# Patient Record
Sex: Female | Born: 1960 | Race: White | Hispanic: No | Marital: Married | Smoking: Never smoker
Health system: Southern US, Community
[De-identification: ages and names within clinical notes are randomized; demographics above are authoritative.]

## PROBLEM LIST (undated history)

## (undated) DIAGNOSIS — Z87898 Personal history of other specified conditions: Secondary | ICD-10-CM

## (undated) DIAGNOSIS — C4339 Malignant melanoma of other parts of face: Secondary | ICD-10-CM

## (undated) DIAGNOSIS — R519 Headache, unspecified: Secondary | ICD-10-CM

## (undated) DIAGNOSIS — F329 Major depressive disorder, single episode, unspecified: Secondary | ICD-10-CM

## (undated) DIAGNOSIS — I214 Non-ST elevation (NSTEMI) myocardial infarction: Secondary | ICD-10-CM

## (undated) DIAGNOSIS — G930 Cerebral cysts: Secondary | ICD-10-CM

## (undated) DIAGNOSIS — I10 Essential (primary) hypertension: Secondary | ICD-10-CM

## (undated) DIAGNOSIS — R112 Nausea with vomiting, unspecified: Secondary | ICD-10-CM

## (undated) DIAGNOSIS — T8859XA Other complications of anesthesia, initial encounter: Secondary | ICD-10-CM

## (undated) DIAGNOSIS — E7800 Pure hypercholesterolemia, unspecified: Secondary | ICD-10-CM

## (undated) DIAGNOSIS — J3089 Other allergic rhinitis: Secondary | ICD-10-CM

## (undated) DIAGNOSIS — N200 Calculus of kidney: Secondary | ICD-10-CM

## (undated) DIAGNOSIS — Z9889 Other specified postprocedural states: Secondary | ICD-10-CM

## (undated) DIAGNOSIS — J45909 Unspecified asthma, uncomplicated: Secondary | ICD-10-CM

## (undated) DIAGNOSIS — Z87442 Personal history of urinary calculi: Secondary | ICD-10-CM

## (undated) DIAGNOSIS — R300 Dysuria: Secondary | ICD-10-CM

## (undated) DIAGNOSIS — K219 Gastro-esophageal reflux disease without esophagitis: Secondary | ICD-10-CM

## (undated) DIAGNOSIS — H7490 Unspecified disorder of middle ear and mastoid, unspecified ear: Secondary | ICD-10-CM

## (undated) DIAGNOSIS — C439 Malignant melanoma of skin, unspecified: Secondary | ICD-10-CM

## (undated) DIAGNOSIS — I5022 Chronic systolic (congestive) heart failure: Secondary | ICD-10-CM

## (undated) DIAGNOSIS — N281 Cyst of kidney, acquired: Secondary | ICD-10-CM

## (undated) DIAGNOSIS — Z8719 Personal history of other diseases of the digestive system: Secondary | ICD-10-CM

## (undated) DIAGNOSIS — N201 Calculus of ureter: Secondary | ICD-10-CM

## (undated) DIAGNOSIS — C07 Malignant neoplasm of parotid gland: Secondary | ICD-10-CM

## (undated) DIAGNOSIS — I5181 Takotsubo syndrome: Secondary | ICD-10-CM

## (undated) DIAGNOSIS — E785 Hyperlipidemia, unspecified: Secondary | ICD-10-CM

## (undated) DIAGNOSIS — R350 Frequency of micturition: Secondary | ICD-10-CM

## (undated) DIAGNOSIS — M62830 Muscle spasm of back: Secondary | ICD-10-CM

## (undated) DIAGNOSIS — G8929 Other chronic pain: Secondary | ICD-10-CM

## (undated) DIAGNOSIS — J189 Pneumonia, unspecified organism: Secondary | ICD-10-CM

## (undated) DIAGNOSIS — F32A Depression, unspecified: Secondary | ICD-10-CM

## (undated) DIAGNOSIS — M545 Low back pain, unspecified: Secondary | ICD-10-CM

## (undated) DIAGNOSIS — Z8709 Personal history of other diseases of the respiratory system: Secondary | ICD-10-CM

## (undated) DIAGNOSIS — R3915 Urgency of urination: Secondary | ICD-10-CM

## (undated) DIAGNOSIS — Z973 Presence of spectacles and contact lenses: Secondary | ICD-10-CM

## (undated) DIAGNOSIS — R51 Headache: Secondary | ICD-10-CM

## (undated) DIAGNOSIS — F419 Anxiety disorder, unspecified: Secondary | ICD-10-CM

## (undated) DIAGNOSIS — M1990 Unspecified osteoarthritis, unspecified site: Secondary | ICD-10-CM

## (undated) DIAGNOSIS — I420 Dilated cardiomyopathy: Secondary | ICD-10-CM

## (undated) HISTORY — PX: CARDIOVASCULAR STRESS TEST: SHX262

## (undated) HISTORY — DX: Malignant melanoma of skin, unspecified: C43.9

## (undated) HISTORY — PX: BACK SURGERY: SHX140

## (undated) HISTORY — PX: OTHER SURGICAL HISTORY: SHX169

## (undated) HISTORY — PX: BREAST BIOPSY: SHX20

## (undated) HISTORY — PX: CARDIAC CATHETERIZATION: SHX172

---
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
## 1999-06-20 DIAGNOSIS — C439 Malignant melanoma of skin, unspecified: Secondary | ICD-10-CM

## 1999-06-20 DIAGNOSIS — Z85820 Personal history of malignant melanoma of skin: Secondary | ICD-10-CM

## 1999-06-20 DIAGNOSIS — Z9889 Other specified postprocedural states: Secondary | ICD-10-CM

## 1999-06-20 HISTORY — PX: MELANOMA EXCISION WITH SENTINEL LYMPH NODE BIOPSY: SHX5267

## 1999-06-20 HISTORY — DX: Personal history of malignant melanoma of skin: Z85.820

## 1999-06-20 HISTORY — DX: Other specified postprocedural states: Z98.890

## 1999-06-20 HISTORY — DX: Malignant melanoma of skin, unspecified: C43.9

## 2006-06-19 DIAGNOSIS — Z85818 Personal history of malignant neoplasm of other sites of lip, oral cavity, and pharynx: Secondary | ICD-10-CM

## 2006-06-19 DIAGNOSIS — C089 Malignant neoplasm of major salivary gland, unspecified: Secondary | ICD-10-CM

## 2006-06-19 HISTORY — DX: Malignant neoplasm of major salivary gland, unspecified: C08.9

## 2006-06-19 HISTORY — DX: Personal history of malignant neoplasm of other sites of lip, oral cavity, and pharynx: Z85.818

## 2006-08-09 ENCOUNTER — Ambulatory Visit: Payer: Self-pay | Admitting: Family Medicine

## 2006-08-23 ENCOUNTER — Other Ambulatory Visit: Admission: RE | Admit: 2006-08-23 | Discharge: 2006-08-23 | Payer: Self-pay | Admitting: Otolaryngology

## 2006-08-27 ENCOUNTER — Ambulatory Visit: Payer: Self-pay | Admitting: Oncology

## 2006-09-11 ENCOUNTER — Encounter: Admission: RE | Admit: 2006-09-11 | Discharge: 2006-09-11 | Payer: Self-pay | Admitting: Otolaryngology

## 2006-09-12 ENCOUNTER — Inpatient Hospital Stay (HOSPITAL_COMMUNITY): Admission: RE | Admit: 2006-09-12 | Discharge: 2006-09-15 | Payer: Self-pay | Admitting: Otolaryngology

## 2006-09-12 ENCOUNTER — Encounter (INDEPENDENT_AMBULATORY_CARE_PROVIDER_SITE_OTHER): Payer: Self-pay | Admitting: *Deleted

## 2006-10-12 ENCOUNTER — Ambulatory Visit: Payer: Self-pay | Admitting: Oncology

## 2006-10-30 LAB — CBC WITH DIFFERENTIAL/PLATELET
BASO%: 0.5 % (ref 0.0–2.0)
EOS%: 1.1 % (ref 0.0–7.0)
LYMPH%: 22.9 % (ref 14.0–48.0)
MCHC: 36 g/dL (ref 32.0–36.0)
MCV: 90.1 fL (ref 81.0–101.0)
MONO%: 8.7 % (ref 0.0–13.0)
Platelets: 211 10*3/uL (ref 145–400)
RBC: 4.1 10*6/uL (ref 3.70–5.32)

## 2006-10-30 LAB — COMPREHENSIVE METABOLIC PANEL
ALT: 15 U/L (ref 0–35)
AST: 17 U/L (ref 0–37)
Alkaline Phosphatase: 43 U/L (ref 39–117)
Sodium: 140 mEq/L (ref 135–145)
Total Bilirubin: 0.5 mg/dL (ref 0.3–1.2)
Total Protein: 6.8 g/dL (ref 6.0–8.3)

## 2006-11-05 ENCOUNTER — Ambulatory Visit (HOSPITAL_COMMUNITY): Admission: RE | Admit: 2006-11-05 | Discharge: 2006-11-05 | Payer: Self-pay | Admitting: Oncology

## 2006-11-16 ENCOUNTER — Ambulatory Visit: Admission: RE | Admit: 2006-11-16 | Discharge: 2007-02-27 | Payer: Self-pay | Admitting: Radiation Oncology

## 2006-11-21 ENCOUNTER — Ambulatory Visit: Payer: Self-pay | Admitting: Dentistry

## 2006-11-21 ENCOUNTER — Encounter: Admission: AD | Admit: 2006-11-21 | Discharge: 2006-11-21 | Payer: Self-pay | Admitting: Dentistry

## 2006-11-26 ENCOUNTER — Ambulatory Visit: Payer: Self-pay | Admitting: Dentistry

## 2006-11-27 ENCOUNTER — Ambulatory Visit: Payer: Self-pay | Admitting: Oncology

## 2006-11-29 LAB — CBC WITH DIFFERENTIAL/PLATELET
Basophils Absolute: 0 10*3/uL (ref 0.0–0.1)
EOS%: 1.7 % (ref 0.0–7.0)
Eosinophils Absolute: 0.1 10*3/uL (ref 0.0–0.5)
HCT: 38.2 % (ref 34.8–46.6)
MCH: 32.6 pg (ref 26.0–34.0)
MCHC: 36.1 g/dL — ABNORMAL HIGH (ref 32.0–36.0)
MONO#: 0.4 10*3/uL (ref 0.1–0.9)
MONO%: 10.8 % (ref 0.0–13.0)
Platelets: 201 10*3/uL (ref 145–400)
WBC: 3.9 10*3/uL (ref 3.9–10.0)

## 2006-11-29 LAB — COMPREHENSIVE METABOLIC PANEL
AST: 16 U/L (ref 0–37)
Albumin: 4.3 g/dL (ref 3.5–5.2)
Alkaline Phosphatase: 41 U/L (ref 39–117)
BUN: 16 mg/dL (ref 6–23)
Calcium: 8.5 mg/dL (ref 8.4–10.5)
Chloride: 106 mEq/L (ref 96–112)
Glucose, Bld: 97 mg/dL (ref 70–99)
Potassium: 4.2 mEq/L (ref 3.5–5.3)
Sodium: 140 mEq/L (ref 135–145)
Total Protein: 6.7 g/dL (ref 6.0–8.3)

## 2006-12-25 LAB — CBC WITH DIFFERENTIAL/PLATELET
BASO%: 0.3 % (ref 0.0–2.0)
EOS%: 1.3 % (ref 0.0–7.0)
LYMPH%: 23.5 % (ref 14.0–48.0)
MCHC: 35.7 g/dL (ref 32.0–36.0)
MCV: 91 fL (ref 81.0–101.0)
MONO%: 9.3 % (ref 0.0–13.0)
Platelets: 191 10*3/uL (ref 145–400)
RBC: 4.3 10*6/uL (ref 3.70–5.32)
RDW: 12.2 % (ref 11.3–14.5)

## 2007-01-02 ENCOUNTER — Ambulatory Visit: Payer: Self-pay | Admitting: Dentistry

## 2007-01-31 ENCOUNTER — Emergency Department (HOSPITAL_COMMUNITY): Admission: EM | Admit: 2007-01-31 | Discharge: 2007-01-31 | Payer: Self-pay | Admitting: Emergency Medicine

## 2007-02-15 ENCOUNTER — Ambulatory Visit: Payer: Self-pay | Admitting: Oncology

## 2007-03-05 ENCOUNTER — Ambulatory Visit: Payer: Self-pay | Admitting: Dentistry

## 2007-03-05 LAB — COMPREHENSIVE METABOLIC PANEL
ALT: 9 U/L (ref 0–35)
Albumin: 3.8 g/dL (ref 3.5–5.2)
CO2: 23 mEq/L (ref 19–32)
Potassium: 3.7 mEq/L (ref 3.5–5.3)
Sodium: 137 mEq/L (ref 135–145)
Total Bilirubin: 0.7 mg/dL (ref 0.3–1.2)
Total Protein: 5.9 g/dL — ABNORMAL LOW (ref 6.0–8.3)

## 2007-03-05 LAB — CBC WITH DIFFERENTIAL/PLATELET
BASO%: 0.1 % (ref 0.0–2.0)
LYMPH%: 15.5 % (ref 14.0–48.0)
MCHC: 35.7 g/dL (ref 32.0–36.0)
MONO#: 0.2 10*3/uL (ref 0.1–0.9)
NEUT#: 1.3 10*3/uL — ABNORMAL LOW (ref 1.5–6.5)
Platelets: 145 10*3/uL (ref 145–400)
RBC: 3.74 10*6/uL (ref 3.70–5.32)
RDW: 13.7 % (ref 11.3–14.5)
WBC: 1.9 10*3/uL — ABNORMAL LOW (ref 3.9–10.0)
lymph#: 0.3 10*3/uL — ABNORMAL LOW (ref 0.9–3.3)

## 2007-03-07 ENCOUNTER — Ambulatory Visit: Admission: RE | Admit: 2007-03-07 | Discharge: 2007-03-07 | Payer: Self-pay | Admitting: Radiation Oncology

## 2007-04-02 ENCOUNTER — Ambulatory Visit (HOSPITAL_COMMUNITY): Admission: RE | Admit: 2007-04-02 | Discharge: 2007-04-02 | Payer: Self-pay | Admitting: Oncology

## 2007-04-03 ENCOUNTER — Ambulatory Visit: Payer: Self-pay | Admitting: Oncology

## 2007-04-05 LAB — CBC WITH DIFFERENTIAL/PLATELET
BASO%: 0.2 % (ref 0.0–2.0)
LYMPH%: 15.1 % (ref 14.0–48.0)
MCHC: 35.6 g/dL (ref 32.0–36.0)
MCV: 92.2 fL (ref 81.0–101.0)
MONO#: 0.3 10*3/uL (ref 0.1–0.9)
MONO%: 12.1 % (ref 0.0–13.0)
Platelets: 179 10*3/uL (ref 145–400)
RBC: 3.45 10*6/uL — ABNORMAL LOW (ref 3.70–5.32)
WBC: 2.8 10*3/uL — ABNORMAL LOW (ref 3.9–10.0)

## 2007-04-05 LAB — COMPREHENSIVE METABOLIC PANEL
ALT: <8 U/L (ref 0–35)
Alkaline Phosphatase: 33 U/L — ABNORMAL LOW (ref 39–117)
Sodium: 139 mEq/L (ref 135–145)
Total Bilirubin: 0.4 mg/dL (ref 0.3–1.2)
Total Protein: 6.5 g/dL (ref 6.0–8.3)

## 2007-04-24 ENCOUNTER — Encounter: Admission: RE | Admit: 2007-04-24 | Discharge: 2007-04-24 | Payer: Self-pay | Admitting: Oncology

## 2007-06-03 ENCOUNTER — Ambulatory Visit: Payer: Self-pay | Admitting: Oncology

## 2007-06-05 LAB — CBC WITH DIFFERENTIAL/PLATELET
BASO%: 0.4 % (ref 0.0–2.0)
LYMPH%: 16.1 % (ref 14.0–48.0)
MCHC: 35.4 g/dL (ref 32.0–36.0)
MCV: 92.9 fL (ref 81.0–101.0)
MONO%: 9.9 % (ref 0.0–13.0)
Platelets: 153 10*3/uL (ref 145–400)
RBC: 4.03 10*6/uL (ref 3.70–5.32)

## 2007-06-05 LAB — COMPREHENSIVE METABOLIC PANEL
Alkaline Phosphatase: 41 U/L (ref 39–117)
Glucose, Bld: 93 mg/dL (ref 70–99)
Sodium: 143 mEq/L (ref 135–145)
Total Bilirubin: 0.5 mg/dL (ref 0.3–1.2)
Total Protein: 6.9 g/dL (ref 6.0–8.3)

## 2007-10-01 ENCOUNTER — Ambulatory Visit (HOSPITAL_COMMUNITY): Admission: RE | Admit: 2007-10-01 | Discharge: 2007-10-01 | Payer: Self-pay | Admitting: Oncology

## 2007-10-08 ENCOUNTER — Ambulatory Visit: Payer: Self-pay | Admitting: Oncology

## 2007-10-10 LAB — CBC WITH DIFFERENTIAL/PLATELET
BASO%: 0.3 % (ref 0.0–2.0)
LYMPH%: 13.6 % — ABNORMAL LOW (ref 14.0–48.0)
MCHC: 36.3 g/dL — ABNORMAL HIGH (ref 32.0–36.0)
MONO#: 0.3 10*3/uL (ref 0.1–0.9)
Platelets: 168 10*3/uL (ref 145–400)
RBC: 3.97 10*6/uL (ref 3.70–5.32)
RDW: 12.8 % (ref 11.3–14.5)
WBC: 2.8 10*3/uL — ABNORMAL LOW (ref 3.9–10.0)

## 2007-10-10 LAB — COMPREHENSIVE METABOLIC PANEL
ALT: 9 U/L (ref 0–35)
Albumin: 4.3 g/dL (ref 3.5–5.2)
Alkaline Phosphatase: 42 U/L (ref 39–117)
CO2: 22 mEq/L (ref 19–32)
Potassium: 4.3 mEq/L (ref 3.5–5.3)
Sodium: 140 mEq/L (ref 135–145)
Total Bilirubin: 0.5 mg/dL (ref 0.3–1.2)
Total Protein: 6.7 g/dL (ref 6.0–8.3)

## 2007-12-05 ENCOUNTER — Ambulatory Visit: Admission: RE | Admit: 2007-12-05 | Discharge: 2007-12-05 | Payer: Self-pay | Admitting: Radiation Oncology

## 2007-12-05 LAB — TSH: TSH: 1.021 u[IU]/mL (ref 0.350–5.500)

## 2008-01-07 ENCOUNTER — Ambulatory Visit: Payer: Self-pay | Admitting: Oncology

## 2008-01-09 LAB — COMPREHENSIVE METABOLIC PANEL
AST: 13 U/L (ref 0–37)
Albumin: 4.4 g/dL (ref 3.5–5.2)
BUN: 16 mg/dL (ref 6–23)
CO2: 26 mEq/L (ref 19–32)
Calcium: 8.7 mg/dL (ref 8.4–10.5)
Chloride: 105 mEq/L (ref 96–112)
Creatinine, Ser: 0.64 mg/dL (ref 0.40–1.20)
Glucose, Bld: 82 mg/dL (ref 70–99)
Potassium: 3.9 mEq/L (ref 3.5–5.3)

## 2008-01-09 LAB — CBC WITH DIFFERENTIAL/PLATELET
Basophils Absolute: 0 10*3/uL (ref 0.0–0.1)
Eosinophils Absolute: 0 10*3/uL (ref 0.0–0.5)
HCT: 35.1 % (ref 34.8–46.6)
HGB: 12.4 g/dL (ref 11.6–15.9)
MONO#: 0.3 10*3/uL (ref 0.1–0.9)
NEUT#: 1.8 10*3/uL (ref 1.5–6.5)
NEUT%: 68.9 % (ref 39.6–76.8)
RDW: 12.8 % (ref 11.3–14.5)
lymph#: 0.5 10*3/uL — ABNORMAL LOW (ref 0.9–3.3)

## 2008-01-09 LAB — LACTATE DEHYDROGENASE: LDH: 103 U/L (ref 94–250)

## 2008-01-19 ENCOUNTER — Emergency Department (HOSPITAL_COMMUNITY): Admission: EM | Admit: 2008-01-19 | Discharge: 2008-01-19 | Payer: Self-pay | Admitting: Emergency Medicine

## 2008-04-02 ENCOUNTER — Ambulatory Visit: Payer: Self-pay | Admitting: Oncology

## 2008-04-06 ENCOUNTER — Ambulatory Visit (HOSPITAL_COMMUNITY): Admission: RE | Admit: 2008-04-06 | Discharge: 2008-04-06 | Payer: Self-pay | Admitting: Oncology

## 2008-04-06 LAB — CBC WITH DIFFERENTIAL/PLATELET
BASO%: 0.4 % (ref 0.0–2.0)
Basophils Absolute: 0 10*3/uL (ref 0.0–0.1)
EOS%: 1.5 % (ref 0.0–7.0)
HCT: 36 % (ref 34.8–46.6)
HGB: 12.9 g/dL (ref 11.6–15.9)
LYMPH%: 27.6 % (ref 14.0–48.0)
MCH: 33.4 pg (ref 26.0–34.0)
MCHC: 35.8 g/dL (ref 32.0–36.0)
NEUT%: 61 % (ref 39.6–76.8)
Platelets: 139 10*3/uL — ABNORMAL LOW (ref 145–400)
lymph#: 0.7 10*3/uL — ABNORMAL LOW (ref 0.9–3.3)

## 2008-04-06 LAB — COMPREHENSIVE METABOLIC PANEL
ALT: 10 U/L (ref 0–35)
BUN: 8 mg/dL (ref 6–23)
CO2: 30 mEq/L (ref 19–32)
Calcium: 9.3 mg/dL (ref 8.4–10.5)
Chloride: 102 mEq/L (ref 96–112)
Creatinine, Ser: 0.66 mg/dL (ref 0.40–1.20)
Total Bilirubin: 0.8 mg/dL (ref 0.3–1.2)

## 2008-04-06 LAB — LACTATE DEHYDROGENASE: LDH: 88 U/L — ABNORMAL LOW (ref 94–250)

## 2008-05-05 ENCOUNTER — Encounter: Admission: RE | Admit: 2008-05-05 | Discharge: 2008-05-05 | Payer: Self-pay | Admitting: Obstetrics and Gynecology

## 2008-07-06 ENCOUNTER — Ambulatory Visit: Payer: Self-pay | Admitting: Oncology

## 2009-04-12 ENCOUNTER — Ambulatory Visit (HOSPITAL_COMMUNITY): Admission: RE | Admit: 2009-04-12 | Discharge: 2009-04-12 | Payer: Self-pay | Admitting: Obstetrics and Gynecology

## 2009-04-12 HISTORY — PX: DIAGNOSTIC LAPAROSCOPY: SUR761

## 2009-04-18 ENCOUNTER — Inpatient Hospital Stay (HOSPITAL_COMMUNITY): Admission: AD | Admit: 2009-04-18 | Discharge: 2009-04-18 | Payer: Self-pay | Admitting: Obstetrics and Gynecology

## 2009-05-25 ENCOUNTER — Encounter: Admission: RE | Admit: 2009-05-25 | Discharge: 2009-05-25 | Payer: Self-pay | Admitting: Obstetrics and Gynecology

## 2010-05-17 ENCOUNTER — Ambulatory Visit (HOSPITAL_COMMUNITY)
Admission: RE | Admit: 2010-05-17 | Discharge: 2010-05-17 | Payer: Self-pay | Source: Home / Self Care | Admitting: Urology

## 2010-05-31 ENCOUNTER — Encounter
Admission: RE | Admit: 2010-05-31 | Discharge: 2010-05-31 | Payer: Self-pay | Source: Home / Self Care | Attending: Obstetrics and Gynecology | Admitting: Obstetrics and Gynecology

## 2010-07-10 ENCOUNTER — Encounter: Payer: Self-pay | Admitting: Oncology

## 2010-07-10 ENCOUNTER — Encounter: Payer: Self-pay | Admitting: Otolaryngology

## 2010-09-08 ENCOUNTER — Ambulatory Visit: Payer: BC Managed Care – PPO | Attending: Specialist | Admitting: Physical Therapy

## 2010-09-08 DIAGNOSIS — M545 Low back pain, unspecified: Secondary | ICD-10-CM | POA: Insufficient documentation

## 2010-09-08 DIAGNOSIS — R5381 Other malaise: Secondary | ICD-10-CM | POA: Insufficient documentation

## 2010-09-08 DIAGNOSIS — IMO0001 Reserved for inherently not codable concepts without codable children: Secondary | ICD-10-CM | POA: Insufficient documentation

## 2010-09-15 ENCOUNTER — Ambulatory Visit: Payer: BC Managed Care – PPO | Admitting: Physical Therapy

## 2010-09-16 ENCOUNTER — Ambulatory Visit: Payer: BC Managed Care – PPO | Admitting: Physical Therapy

## 2010-09-22 ENCOUNTER — Ambulatory Visit: Payer: BC Managed Care – PPO | Attending: Specialist | Admitting: Physical Therapy

## 2010-09-22 DIAGNOSIS — M545 Low back pain, unspecified: Secondary | ICD-10-CM | POA: Insufficient documentation

## 2010-09-22 DIAGNOSIS — R5381 Other malaise: Secondary | ICD-10-CM | POA: Insufficient documentation

## 2010-09-22 DIAGNOSIS — IMO0001 Reserved for inherently not codable concepts without codable children: Secondary | ICD-10-CM | POA: Insufficient documentation

## 2010-09-22 LAB — DIFFERENTIAL
Eosinophils Relative: 1 % (ref 0–5)
Lymphocytes Relative: 19 % (ref 12–46)
Lymphs Abs: 0.9 10*3/uL (ref 0.7–4.0)
Neutro Abs: 3.5 10*3/uL (ref 1.7–7.7)

## 2010-09-22 LAB — COMPREHENSIVE METABOLIC PANEL
AST: 22 U/L (ref 0–37)
BUN: 8 mg/dL (ref 6–23)
CO2: 28 mEq/L (ref 19–32)
Calcium: 9.3 mg/dL (ref 8.4–10.5)
Creatinine, Ser: 0.53 mg/dL (ref 0.4–1.2)
GFR calc Af Amer: >60 mL/min (ref >60)
GFR calc non Af Amer: >60 mL/min (ref >60)

## 2010-09-22 LAB — CBC
MCHC: 34.2 g/dL (ref 30.0–36.0)
MCV: 97.3 fL (ref 78.0–100.0)
Platelets: 181 10*3/uL (ref 150–400)

## 2010-09-22 LAB — WOUND CULTURE: Gram Stain: NONE SEEN

## 2010-09-29 ENCOUNTER — Ambulatory Visit: Payer: BC Managed Care – PPO | Admitting: Physical Therapy

## 2010-11-01 NOTE — Discharge Summary (Signed)
NAMEMARCIANNA, [name]   MEDICAL RECORD NO.:  [id number]          PATIENT TYPE:  INP   LOCATION:  5736                         FACILITY:  [facility]   PHYSICIAN:  Antony Contras, MD     DATE OF BIRTH:  Aug 25, 1960   DATE OF ADMISSION:  09/12/2006  DATE OF DISCHARGE:  09/15/2006                               DISCHARGE SUMMARY   ADMITTING DIAGNOSIS:  Right malignant parotid mass.   DISCHARGE DIAGNOSIS:  Right malignant parotid mass.   PROCEDURES:  1. Right lateral parotidectomy with facial nerve dissection.  2. Right modified radical neck dissection.   HISTORY OF PRESENT ILLNESS:  The patient is a 50 year old white female  with a remote history of right upper eyelid melanoma, who now has a  several-week history of a tender mass in the right parotid gland.  An  FNA is suggestive of metastatic melanoma.  She presents to the hospital  to go to the operating room for surgical management.   HOSPITAL COURSE:  The patient was taken to the operating room on September 12, 2006 for the above procedures.  For details of the procedures,  please see the dictated operative note.  A drain was placed during  surgery.  Afterwards, she was transferred to a regular hospital room for  drain management.  She complained of nausea and chest discomfort after  surgery.  An EKG was unrevealing.  On the following day, she continued  to have some nausea, but her chest pain improved.  She did complain of  some right upper quadrant pain during that day and was found to have  some tenderness in the right upper quadrant.  A right upper quadrant  ultrasound was performed which demonstrated no pathology.  The abdominal  pain improved with ambulation and resumption of bowel function.  On  postoperative day #3, the drain output had decreased and was removed.  She was able to be discharged from the hospital that day in stable  condition.  She had not yet had a bowel movement, but was  instructed to  call if this continued to be a problem.   DISCHARGE INSTRUCTIONS:  The patient was instructed to clean her  incision twice Tammy with half-strength peroxide and apply bacitracin  ointment.   DIET:  Not restricted.   ACTIVITY:  The patient was asked to increase activity slowly.   DISCHARGE MEDICATIONS:  Prescriptions were given including Percocet  5/325 one to two tablets every 4-6 hours as needed.   FOLLOWUP:  The patient was asked to follow up in 1 week with Dr. Jenne Pane.      Antony Contras, MD  Electronically Signed     DDB/MEDQ  D:  11/09/2006  T:  11/10/2006  Job:  [id number]

## 2010-11-04 NOTE — Op Note (Signed)
[name], [name] NO.:  [id number]   MEDICAL RECORD NO.:  [id number]          PATIENT TYPE:  INP   LOCATION:  5736                         FACILITY:  [facility]   PHYSICIAN:  Antony Contras, MD     DATE OF BIRTH:  Dec 03, 1960   DATE OF PROCEDURE:  09/12/2006  DATE OF DISCHARGE:                               OPERATIVE REPORT   PREOPERATIVE DIAGNOSIS:  Right parotid malignant mass.   POSTOP DIAGNOSIS:  Right parotid malignant mass.   PROCEDURE:  1. Right lateral parotidectomy with nerve dissection.  2. Right modified radical neck dissection sparing the SCM, eleventh      nerve, and internal jugular vein.   SURGEON:  Antony Contras, MD   ASSISTANT:  Gloris Manchester. Lazarus Salines, M.D.   ANESTHESIA:  General endotracheal anesthesia.   COMPLICATIONS:  None.   INDICATIONS:  The patient is a 50 year old white female with a history  of melanoma in 2001 of the right upper eyelid.  She underwent Mohs  resection and sentinel node biopsy, at the time, which was negative.  About a month ago, she developed a mass in front of the right ear that  has been comfortable.  Needle biopsy is suggestive of a malignancy.  She  presents the operating room for surgical management.   FINDINGS:  There was a round mass within the lateral parotid gland as  well as a few other smaller lymph nodes.  The primary mass was sent for  frozen section, and showed a high-grade malignancy.  Zones 1-5 were  removed from the right side of the neck with a few small lymph nodes  within the specimen, but no obvious both metastatic disease.   DESCRIPTION OF PROCEDURE:  The patient was identified in the holding  room, and with informed consent having been obtained, the patient was  brought to the operating suited and placed on the operating table in the  supine position.  Anesthesia was induced; and the patient was intubated  by the anesthesia team without difficulty.  The patient was given  intravenous  antibiotics during the case.  The eyes were taped closed;  and the nerve integrity monitor was attached in the standard fashion and  turned on.  The right face was prepped and draped in sterile fashion;  and the patient was placed on a shoulder roll.  The drapes were placed  in such a way that the face could be watched during the case.  The  incision was marked with a marking pen, starting with a preauricular  incision and then carried down through a neck crease onto the neck.  It  was injected with 1% lidocaine with 1:100,000 of epinephrine.  The  incision was made with a 15-blade scalpel through the skin; and with a  Bovie, electrocautery through the subcutaneous tissues.  The platysma  muscle was divided inferiorly; and the subplatysmal flap was elevated.  A preparotid fascia flap was elevated over the parotid gland using sharp  dissection.   Flaps were then sewed back with silk suture.  Dissection was then  performed in front of  the tragus as well as down to the digastric  muscle.  Careful dissection was performed in this area until the main  trunk of the facial nerve was identified.  The nerve integrity monitor  was used to help with this process.  The nerve was then dissected in a  distal direction, first involving the inferior division, and elevating  the gland off of the division.  This was extended then to the middle  divisions; and then following the superior division.  The mass that was  within the superior gland was able to be elevated off of the nerves  without any difficulty.  The gland was then further dissected in an  anterior direction, keeping all of the nerve branches in view, until the  lateral gland was removed.  This was passed to nursing for pathology and  a frozen section was requested on the dominant mass.   While we were waiting for this, subplatysmal flaps were elevated in the  neck superiorly and inferiorly.  The marginal mandibular nerve was then  traced in  the distal direction elevating soft tissues in an inferior  direction; and exposing the mandibular border.  This was extended into  the submental region until the digastric muscle could be traced in a  retrograde fashion toward the hyoid bone.  It was thought that some of  the fat from the submental triangle was also removed in this fashion.  The strap muscles were then dissected in an inferior direction providing  a medial border for the dissection.  The digastric muscle was then  further dissected as well as mylohyoid muscle.  The muscle was pulled  anteriorly exposing the zone 1 region.  The submandibular gland was then  dissected free from the underlying tissues until it was tethered on the  duct, and the lingual nerve.  Both of these were divided and ligated.  The gland was then retracted inferiorly with the neck contents over the  digastric muscle.   At this point, the sternocleidomastoid muscle was skeletonized removing  the soft tissues around muscle keeping it attached the specimen.  Further dissection was performed in this regard until the carotid artery  was dissected inferiorly underneath the mylohyoid muscle.  The carotid  and jugular veins were then dissected in an inferior-to-superior  direction.  The fascia was carefully dissected off the internal jugular  vein using a 15-blade scalpel; and branches of the vessels were divided  and ligated in this process.  The twelfth nerve was identified as was at  the eleventh nerve during the dissection.   The zone 2B region was then dissected off of the digastric muscle; and  sternocleidomastoid muscle down to the mastoid tip.  This was then  passed under the eleventh nerve and then the dissection continued in an  inferior direction in this region.  This was performed back underneath  the sternocleidomastoid muscle that includes on C5.  The cervical roots  were cut in the process to allow removal of the tissue of these tissues. The  phrenic nerve was identified and traced keeping it intact as well.  The neck contents were then removed, and marked with a suture.  The neck  was then copiously irrigated with saline; and any bleeding controlled  with bipolar electrocautery.  A Valsalva was performed; and no  additional bleeding was seen.  A large drain was placed in the  dissection region, then coming out a separate stab incision inferiorly.  It was secured to the skin  using a 2-0 silk suture and a standard drain  stitch.   The incision was then closed with 3-0 Vicryl suture in an interrupted  and simple running fashion in the platysma layer.  Skin was then closed  with a running 5-0 nylon suture.  The drain was hooked to suction during  closure.  At this point, the patient was returned to Anesthesia for wake-  up; and was extubated curved and moved to the recovery room in stable  condition.      Antony Contras, MD  Electronically Signed     DDB/MEDQ  D:  09/12/2006  T:  09/12/2006  Job:  [id number]

## 2011-03-17 LAB — DIFFERENTIAL
Basophils Absolute: 0
Basophils Relative: 0
Eosinophils Absolute: 0
Eosinophils Relative: 1
Lymphocytes Relative: 9 — ABNORMAL LOW
Lymphs Abs: 0.4 — ABNORMAL LOW
Monocytes Absolute: 0.4
Monocytes Relative: 9
Neutro Abs: 4.1
Neutrophils Relative %: 82 — ABNORMAL HIGH

## 2011-03-17 LAB — URINALYSIS, ROUTINE W REFLEX MICROSCOPIC
Ketones, ur: 15 — AB
Nitrite: NEGATIVE
pH: 6.5

## 2011-03-17 LAB — CBC
HCT: 40.8
Hemoglobin: 14.3
MCHC: 35
MCV: 96.2
Platelets: 139 — ABNORMAL LOW
RBC: 4.24
RDW: 12.7
WBC: 5.1

## 2011-03-17 LAB — BASIC METABOLIC PANEL
BUN: 9
CO2: 27
Calcium: 9.1
Chloride: 102
Creatinine, Ser: 0.66
GFR calc Af Amer: >60
GFR calc non Af Amer: >60
Glucose, Bld: 85
Potassium: 4.3
Sodium: 137

## 2011-03-17 LAB — CK TOTAL AND CKMB (NOT AT ARMC): Relative Index: INVALID

## 2011-03-17 LAB — TROPONIN I: Troponin I: 0.01

## 2011-03-23 ENCOUNTER — Other Ambulatory Visit: Payer: Self-pay | Admitting: Obstetrics and Gynecology

## 2011-03-23 DIAGNOSIS — Z1231 Encounter for screening mammogram for malignant neoplasm of breast: Secondary | ICD-10-CM

## 2011-04-03 LAB — URINALYSIS, ROUTINE W REFLEX MICROSCOPIC
Ketones, ur: 15 — AB
Leukocytes, UA: NEGATIVE
Nitrite: NEGATIVE
Specific Gravity, Urine: 1.019
Urobilinogen, UA: 0.2

## 2011-04-03 LAB — URINE MICROSCOPIC-ADD ON

## 2011-04-26 ENCOUNTER — Other Ambulatory Visit: Payer: Self-pay | Admitting: Specialist

## 2011-04-26 DIAGNOSIS — M5126 Other intervertebral disc displacement, lumbar region: Secondary | ICD-10-CM

## 2011-05-05 ENCOUNTER — Ambulatory Visit
Admission: RE | Admit: 2011-05-05 | Discharge: 2011-05-05 | Disposition: A | Discharge status: Home / Self Care | Payer: BC Managed Care – PPO | Source: Ambulatory Visit | Attending: Specialist | Admitting: Specialist

## 2011-05-05 DIAGNOSIS — M5126 Other intervertebral disc displacement, lumbar region: Secondary | ICD-10-CM

## 2011-05-05 MED ORDER — IOHEXOL 180 MG/ML  SOLN
13.0000 mL | Freq: Once | INTRAMUSCULAR | Status: AC | PRN
  Administered 2011-05-05: 13 mL via INTRATHECAL

## 2011-05-05 MED ORDER — DIAZEPAM 5 MG PO TABS
10.0000 mg | ORAL_TABLET | Freq: Once | ORAL | Status: AC
  Administered 2011-05-05: 10 mg via ORAL

## 2011-05-05 NOTE — Patient Instructions (Signed)

## 2011-05-10 ENCOUNTER — Other Ambulatory Visit: Payer: Self-pay | Admitting: Otolaryngology

## 2011-05-10 ENCOUNTER — Encounter (HOSPITAL_COMMUNITY): Payer: Self-pay | Admitting: Pharmacy Technician

## 2011-05-15 ENCOUNTER — Encounter (HOSPITAL_COMMUNITY): Payer: Self-pay

## 2011-05-15 ENCOUNTER — Encounter (HOSPITAL_COMMUNITY)
Admission: RE | Admit: 2011-05-15 | Discharge: 2011-05-15 | Disposition: A | Discharge status: Home / Self Care | Payer: BC Managed Care – PPO | Source: Ambulatory Visit | Attending: Specialist | Admitting: Specialist

## 2011-05-15 HISTORY — DX: Calculus of kidney: N20.0

## 2011-05-15 HISTORY — DX: Other specified postprocedural states: R11.2

## 2011-05-15 HISTORY — DX: Other specified postprocedural states: Z98.890

## 2011-05-15 LAB — URINALYSIS, ROUTINE W REFLEX MICROSCOPIC
Glucose, UA: NEGATIVE mg/dL
Protein, ur: NEGATIVE mg/dL
Specific Gravity, Urine: 1.015 (ref 1.005–1.030)
Urobilinogen, UA: 0.2 mg/dL (ref 0.0–1.0)

## 2011-05-15 LAB — COMPREHENSIVE METABOLIC PANEL
AST: 19 U/L (ref 0–37)
Albumin: 4.1 g/dL (ref 3.5–5.2)
CO2: 29 mEq/L (ref 19–32)
Calcium: 9.9 mg/dL (ref 8.4–10.5)
Creatinine, Ser: 0.63 mg/dL (ref 0.50–1.10)
GFR calc non Af Amer: >90 mL/min (ref >90)
Total Protein: 7.1 g/dL (ref 6.0–8.3)

## 2011-05-15 LAB — DIFFERENTIAL
Basophils Absolute: 0 10*3/uL (ref 0.0–0.1)
Lymphocytes Relative: 28 % (ref 12–46)
Lymphs Abs: 0.9 10*3/uL (ref 0.7–4.0)
Monocytes Absolute: 0.3 10*3/uL (ref 0.1–1.0)
Neutro Abs: 1.9 10*3/uL (ref 1.7–7.7)

## 2011-05-15 LAB — CBC
MCH: 31.4 pg (ref 26.0–34.0)
MCHC: 35.3 g/dL (ref 30.0–36.0)
MCV: 89 fL (ref 78.0–100.0)
Platelets: 155 10*3/uL (ref 150–400)
RDW: 12.4 % (ref 11.5–15.5)

## 2011-05-15 LAB — URINE MICROSCOPIC-ADD ON

## 2011-05-15 LAB — SURGICAL PCR SCREEN: MRSA, PCR: NEGATIVE

## 2011-05-15 NOTE — Patient Instructions (Signed)
20 [name]  05/15/2011   Your procedure is scheduled on:  Thurs. 05/18/2011  Report to [facility] at 1035 AM.  Call this number if you have problems the morning of surgery: [phone]   Remember: Pre-admission #- [phone] [facility]   Do not eat food:After Midnight.  May have clear liquids:until Midnight .  Clear liquids include soda, tea, black coffee, apple or grape juice, broth.  Take these medicines the morning of surgery with A SIP OF WATER: Hydrocodone   Do not wear jewelry, make-up or nail polish.  Do not wear lotions, powders, or perfumes.   Do not shave 48 hours prior to surgery.( women only )  Do not bring valuables to the hospital.  Contacts, dentures or bridgework may not be worn into surgery.  Leave suitcase in the car. After surgery it may be brought to your room.  For patients admitted to the hospital, checkout time is 11:00 AM the day of discharge.   Patients discharged the day of surgery will not be allowed to drive home.  Name and phone number of your driver:   Special Instructions: CHG Shower Use Special Wash: 1/2 bottle night before surgery and 1/2 bottle morning of surgery.   Please read over the following fact sheets that you were given: MRSA Information

## 2011-05-16 ENCOUNTER — Other Ambulatory Visit: Payer: Self-pay | Admitting: Otolaryngology

## 2011-05-18 ENCOUNTER — Observation Stay (HOSPITAL_COMMUNITY)
Admission: RE | Admit: 2011-05-18 | Discharge: 2011-05-19 | Disposition: A | Discharge status: Home / Self Care | Payer: BC Managed Care – PPO | Source: Ambulatory Visit | Attending: Specialist | Admitting: Specialist

## 2011-05-18 ENCOUNTER — Encounter (HOSPITAL_COMMUNITY): Payer: Self-pay | Admitting: *Deleted

## 2011-05-18 ENCOUNTER — Ambulatory Visit (HOSPITAL_COMMUNITY): Payer: BC Managed Care – PPO | Admitting: Anesthesiology

## 2011-05-18 ENCOUNTER — Encounter (HOSPITAL_COMMUNITY): Payer: Self-pay | Admitting: Anesthesiology

## 2011-05-18 ENCOUNTER — Ambulatory Visit (HOSPITAL_COMMUNITY): Payer: BC Managed Care – PPO

## 2011-05-18 ENCOUNTER — Encounter (HOSPITAL_COMMUNITY)
Admission: RE | Disposition: A | Discharge status: Home / Self Care | Payer: Self-pay | Source: Ambulatory Visit | Attending: Specialist

## 2011-05-18 DIAGNOSIS — M48061 Spinal stenosis, lumbar region without neurogenic claudication: Principal | ICD-10-CM | POA: Insufficient documentation

## 2011-05-18 DIAGNOSIS — IMO0002 Reserved for concepts with insufficient information to code with codable children: Secondary | ICD-10-CM | POA: Insufficient documentation

## 2011-05-18 DIAGNOSIS — Z85820 Personal history of malignant melanoma of skin: Secondary | ICD-10-CM | POA: Insufficient documentation

## 2011-05-18 HISTORY — PX: LUMBAR LAMINECTOMY/DECOMPRESSION MICRODISCECTOMY: SHX5026

## 2011-05-18 SURGERY — LUMBAR LAMINECTOMY/DECOMPRESSION MICRODISCECTOMY
Anesthesia: General | Site: Back | Laterality: Right | Wound class: Clean

## 2011-05-18 MED ORDER — PROMETHAZINE HCL 25 MG/ML IJ SOLN: 6.2500 mg | INTRAMUSCULAR | Status: DC | PRN

## 2011-05-18 MED ORDER — MIDAZOLAM HCL 5 MG/5ML IJ SOLN
INTRAMUSCULAR | Status: DC | PRN
  Administered 2011-05-18: 2 mg via INTRAVENOUS

## 2011-05-18 MED ORDER — SCOPOLAMINE 1 MG/3DAYS TD PT72
MEDICATED_PATCH | TRANSDERMAL | Status: DC | PRN
  Administered 2011-05-18: 1.5 mg via TRANSDERMAL

## 2011-05-18 MED ORDER — FENTANYL CITRATE 0.05 MG/ML IJ SOLN
INTRAMUSCULAR | Status: DC | PRN
  Administered 2011-05-18 (×2): 50 ug via INTRAVENOUS

## 2011-05-18 MED ORDER — LACTATED RINGERS IV SOLN
INTRAVENOUS | Status: DC
  Administered 2011-05-18: 1000 mL via INTRAVENOUS
  Administered 2011-05-18: 13:00:00 via INTRAVENOUS

## 2011-05-18 MED ORDER — SODIUM CHLORIDE 0.9 % IJ SOLN: 3.0000 mL | INTRAMUSCULAR | Status: DC | PRN

## 2011-05-18 MED ORDER — MORPHINE SULFATE 4 MG/ML IJ SOLN
1.0000 mg | INTRAMUSCULAR | Status: DC | PRN
  Administered 2011-05-19: 2 mg via INTRAVENOUS
  Filled 2011-05-18: qty 1

## 2011-05-18 MED ORDER — HYDROCODONE-ACETAMINOPHEN 5-325 MG PO TABS
1.0000 | ORAL_TABLET | ORAL | Status: DC | PRN
  Administered 2011-05-18 (×2): 1 via ORAL
  Filled 2011-05-18: qty 2

## 2011-05-18 MED ORDER — PHENYLEPHRINE HCL 10 MG/ML IJ SOLN
10.0000 mg | INTRAVENOUS | Status: DC | PRN
  Administered 2011-05-18: 20 ug/min via INTRAVENOUS

## 2011-05-18 MED ORDER — EPHEDRINE SULFATE 50 MG/ML IJ SOLN
INTRAMUSCULAR | Status: DC | PRN
  Administered 2011-05-18: 10 mg via INTRAVENOUS
  Administered 2011-05-18: 5 mg via INTRAVENOUS

## 2011-05-18 MED ORDER — SODIUM CHLORIDE 0.9 % IR SOLN
Status: DC | PRN
  Administered 2011-05-18: 13:00:00

## 2011-05-18 MED ORDER — SODIUM CHLORIDE 0.9 % IJ SOLN
3.0000 mL | Freq: Two times a day (BID) | INTRAMUSCULAR | Status: DC
  Administered 2011-05-19: 3 mL via INTRAVENOUS

## 2011-05-18 MED ORDER — ACETAMINOPHEN 650 MG RE SUPP: 650.0000 mg | RECTAL | Status: DC | PRN

## 2011-05-18 MED ORDER — METHOCARBAMOL 100 MG/ML IJ SOLN
500.0000 mg | Freq: Four times a day (QID) | INTRAVENOUS | Status: DC | PRN
  Filled 2011-05-18: qty 5

## 2011-05-18 MED ORDER — ONDANSETRON HCL 4 MG/2ML IJ SOLN: 4.0000 mg | INTRAMUSCULAR | Status: DC | PRN

## 2011-05-18 MED ORDER — ROCURONIUM BROMIDE 100 MG/10ML IV SOLN
INTRAVENOUS | Status: DC | PRN
  Administered 2011-05-18: 35 mg via INTRAVENOUS

## 2011-05-18 MED ORDER — ACETAMINOPHEN 10 MG/ML IV SOLN
INTRAVENOUS | Status: DC | PRN
  Administered 2011-05-18: 1000 mg via INTRAVENOUS

## 2011-05-18 MED ORDER — DEXAMETHASONE SODIUM PHOSPHATE 10 MG/ML IJ SOLN
INTRAMUSCULAR | Status: DC | PRN
  Administered 2011-05-18: 10 mg via INTRAVENOUS

## 2011-05-18 MED ORDER — CHLORHEXIDINE GLUCONATE 4 % EX LIQD: 60.0000 mL | Freq: Once | CUTANEOUS | Status: DC

## 2011-05-18 MED ORDER — VANCOMYCIN HCL IN DEXTROSE 1-5 GM/200ML-% IV SOLN
1000.0000 mg | Freq: Once | INTRAVENOUS | Status: AC
  Administered 2011-05-19: 1000 mg via INTRAVENOUS
  Filled 2011-05-18 (×2): qty 200

## 2011-05-18 MED ORDER — HYDROCODONE-ACETAMINOPHEN 5-325 MG PO TABS: 1.0000 | ORAL_TABLET | ORAL | Status: DC | PRN

## 2011-05-18 MED ORDER — SODIUM CHLORIDE 0.9 % IV SOLN: 250.0000 mL | INTRAVENOUS | Status: DC

## 2011-05-18 MED ORDER — MENTHOL 3 MG MT LOZG
1.0000 | LOZENGE | OROMUCOSAL | Status: DC | PRN
  Filled 2011-05-18: qty 9

## 2011-05-18 MED ORDER — SCOPOLAMINE 1 MG/3DAYS TD PT72
1.0000 | MEDICATED_PATCH | TRANSDERMAL | Status: DC
  Filled 2011-05-18: qty 1

## 2011-05-18 MED ORDER — VANCOMYCIN HCL IN DEXTROSE 1-5 GM/200ML-% IV SOLN
1000.0000 mg | INTRAVENOUS | Status: AC
  Administered 2011-05-18: 1000 mg via INTRAVENOUS
  Filled 2011-05-18: qty 200

## 2011-05-18 MED ORDER — THROMBIN 5000 UNITS EX KIT
PACK | OROMUCOSAL | Status: DC | PRN
  Administered 2011-05-18: 13:00:00 via TOPICAL

## 2011-05-18 MED ORDER — ONDANSETRON HCL 4 MG/2ML IJ SOLN
INTRAMUSCULAR | Status: DC | PRN
  Administered 2011-05-18: 4 mg via INTRAVENOUS

## 2011-05-18 MED ORDER — LIDOCAINE HCL (CARDIAC) 20 MG/ML IV SOLN
INTRAVENOUS | Status: DC | PRN
  Administered 2011-05-18: 70 mg via INTRAVENOUS

## 2011-05-18 MED ORDER — VANCOMYCIN HCL IN DEXTROSE 1-5 GM/200ML-% IV SOLN
INTRAVENOUS | Status: AC
  Filled 2011-05-18: qty 200

## 2011-05-18 MED ORDER — METHOCARBAMOL 500 MG PO TABS
500.0000 mg | ORAL_TABLET | Freq: Four times a day (QID) | ORAL | Status: DC | PRN
  Administered 2011-05-19: 500 mg via ORAL
  Filled 2011-05-18: qty 1

## 2011-05-18 MED ORDER — SCOPOLAMINE 1 MG/3DAYS TD PT72
MEDICATED_PATCH | TRANSDERMAL | Status: AC
  Filled 2011-05-18: qty 1

## 2011-05-18 MED ORDER — ACETAMINOPHEN 325 MG PO TABS: 650.0000 mg | ORAL_TABLET | ORAL | Status: DC | PRN

## 2011-05-18 MED ORDER — PHENOL 1.4 % MT LIQD
1.0000 | OROMUCOSAL | Status: DC | PRN
  Filled 2011-05-18: qty 177

## 2011-05-18 MED ORDER — THROMBIN 5000 UNITS EX SOLR
CUTANEOUS | Status: AC
  Filled 2011-05-18: qty 10000

## 2011-05-18 MED ORDER — PROPOFOL 10 MG/ML IV EMUL
INTRAVENOUS | Status: DC | PRN
  Administered 2011-05-18: 150 mg via INTRAVENOUS

## 2011-05-18 MED ORDER — SODIUM CHLORIDE 0.9 % IV SOLN
INTRAVENOUS | Status: DC
  Administered 2011-05-18: 100 mL/h via INTRAVENOUS

## 2011-05-18 MED ORDER — THROMBIN 5000 UNITS EX KIT
PACK | CUTANEOUS | Status: DC | PRN
  Administered 2011-05-18: 5000 [IU] via TOPICAL

## 2011-05-18 MED ORDER — BUPIVACAINE-EPINEPHRINE 0.5% -1:200000 IJ SOLN
INTRAMUSCULAR | Status: DC | PRN
  Administered 2011-05-18: 10 mL

## 2011-05-18 MED ORDER — HYDROMORPHONE HCL PF 1 MG/ML IJ SOLN: 0.2500 mg | INTRAMUSCULAR | Status: DC | PRN

## 2011-05-18 SURGICAL SUPPLY — 45 items
BAG ZIPLOCK 12X15 (MISCELLANEOUS) ×2 IMPLANT
BENZOIN TINCTURE PRP APPL 2/3 (GAUZE/BANDAGES/DRESSINGS) ×2 IMPLANT
CHLORAPREP W/TINT 26ML (MISCELLANEOUS) IMPLANT
CLEANER TIP ELECTROSURG 2X2 (MISCELLANEOUS) ×2 IMPLANT
CLOSURE STERI STRIP 1/2 X4 (GAUZE/BANDAGES/DRESSINGS) ×2 IMPLANT
CLOTH BEACON ORANGE TIMEOUT ST (SAFETY) ×2 IMPLANT
DECANTER SPIKE VIAL GLASS SM (MISCELLANEOUS) ×2 IMPLANT
DRAPE MICROSCOPE LEICA (MISCELLANEOUS) ×2 IMPLANT
DRAPE POUCH INSTRU U-SHP 10X18 (DRAPES) ×2 IMPLANT
DRAPE SURG 17X11 SM STRL (DRAPES) ×2 IMPLANT
DRSG COVERLET 3X3 (GAUZE/BANDAGES/DRESSINGS) ×2 IMPLANT
DRSG EMULSION OIL 3X3 NADH (GAUZE/BANDAGES/DRESSINGS) IMPLANT
DRSG PAD ABDOMINAL 8X10 ST (GAUZE/BANDAGES/DRESSINGS) IMPLANT
DRSG TELFA 4X5 ISLAND ADH (GAUZE/BANDAGES/DRESSINGS) ×2 IMPLANT
DURAPREP 26ML APPLICATOR (WOUND CARE) ×2 IMPLANT
ELECT REM PT RETURN 9FT ADLT (ELECTROSURGICAL) ×2
ELECTRODE REM PT RTRN 9FT ADLT (ELECTROSURGICAL) ×1 IMPLANT
GLOVE BIOGEL PI IND STRL 6.5 (GLOVE) ×1 IMPLANT
GLOVE BIOGEL PI IND STRL 8 (GLOVE) ×1 IMPLANT
GLOVE BIOGEL PI INDICATOR 6.5 (GLOVE) ×1
GLOVE BIOGEL PI INDICATOR 8 (GLOVE) ×1
GLOVE ECLIPSE 6.5 STRL STRAW (GLOVE) ×2 IMPLANT
GLOVE SURG SS PI 8.0 STRL IVOR (GLOVE) ×4 IMPLANT
KIT BASIN OR (CUSTOM PROCEDURE TRAY) ×2 IMPLANT
KIT POSITIONING SURG ANDREWS (MISCELLANEOUS) ×2 IMPLANT
MANIFOLD NEPTUNE II (INSTRUMENTS) ×2 IMPLANT
NEEDLE SPNL 18GX3.5 QUINCKE PK (NEEDLE) ×4 IMPLANT
PATTIES SURGICAL .5 X.5 (GAUZE/BANDAGES/DRESSINGS) IMPLANT
PATTIES SURGICAL .75X.75 (GAUZE/BANDAGES/DRESSINGS) IMPLANT
PATTIES SURGICAL 1X1 (DISPOSABLE) IMPLANT
SPONGE SURGIFOAM ABS GEL 100 (HEMOSTASIS) ×2 IMPLANT
STAPLER VISISTAT (STAPLE) IMPLANT
STRIP CLOSURE SKIN 1/2X4 (GAUZE/BANDAGES/DRESSINGS) ×2 IMPLANT
SUT PROLENE 3 0 PS 2 (SUTURE) ×2 IMPLANT
SUT VIC AB 0 CT1 27 (SUTURE)
SUT VIC AB 0 CT1 27XBRD ANTBC (SUTURE) IMPLANT
SUT VIC AB 1 CT1 27 (SUTURE) ×2
SUT VIC AB 1 CT1 27XBRD ANTBC (SUTURE) ×1 IMPLANT
SUT VIC AB 1-0 CT2 27 (SUTURE) IMPLANT
SUT VIC AB 2-0 CT1 27 (SUTURE) ×1
SUT VIC AB 2-0 CT1 TAPERPNT 27 (SUTURE) ×1 IMPLANT
SUT VICRYL 0 UR6 27IN ABS (SUTURE) IMPLANT
SYRINGE 10CC LL (SYRINGE) ×2 IMPLANT
TRAY LAMINECTOMY (CUSTOM PROCEDURE TRAY) ×2 IMPLANT
YANKAUER SUCT BULB TIP NO VENT (SUCTIONS) IMPLANT

## 2011-05-18 NOTE — H&P (Signed)
[name] is an 50 y.o. female.   Chief Complaint: right leg pain HPI: stenosis L45 refractory  Past Medical History  Diagnosis Date  . PONV (postoperative nausea and vomiting)   . Cancer 2001    melanoma  . 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 Kidney stones     Past Surgical History  Procedure Date  . Breast surgery 2005- x 2 times    breast biopsies-bilateral  . Diagnostic laparoscopy 2010  . Kidney surgery at age 74    tubes from kidneys stretched to bladder-1966  . Melanoma excision with sentinel lymph node biopsy 2001    mohr's procedure    History reviewed. No pertinent family history. Social History:  does not have a smoking history on file. She does not have any smokeless tobacco history on file. She reports that she drinks alcohol. She reports that she does not use illicit drugs.  Allergies:  Allergies  Allergen Reactions  . Keflex Shortness Of Breath and Rash    Medications Prior to Admission  Medication Dose Route Frequency Provider Last Rate Last Dose  . chlorhexidine (HIBICLENS) 4 % liquid 4 application  60 mL Topical Once Liam Graham, PA      . lactated ringers infusion   Intravenous Continuous Liam Graham, PA 50 mL/hr at 05/18/11 1146 1,000 mL at 05/18/11 1146  . vancomycin (VANCOCIN) IVPB 1000 mg/200 mL premix  1,000 mg Intravenous 60 min Pre-Op Liam Graham, PA       No current outpatient prescriptions on file as of 05/18/2011.    No results found for this or any previous visit (from the past 48 hour(s)). No results found.  Review of Systems  Constitutional: Negative.   HENT: Negative.   Eyes: Negative.   Respiratory: Negative.   Cardiovascular: Negative.   Gastrointestinal: Negative.   Genitourinary: Negative.   Musculoskeletal: Positive for back pain.  Skin: Negative.   Neurological: Positive for focal weakness.       Positive straight leg raise. EHL5-/5. Altered  sensation L5  Endo/Heme/Allergies: Negative.   Psychiatric/Behavioral: Negative.     Blood pressure 115/67, pulse 72, temperature 97.1 F (36.2 C), resp. rate 18, last menstrual period 05/18/2011, SpO2 100.00%. Physical Exam  Constitutional: She is oriented to person, place, and time. She appears well-developed.  HENT:  Head: Normocephalic.  Eyes: Pupils are equal, round, and reactive to light.  Neck: Normal range of motion.  Cardiovascular: Normal rate.   Respiratory: Effort normal.  GI: Soft.  Musculoskeletal: Normal range of motion.  Neurological: She is alert and oriented to person, place, and time.       Positive straight raise right. EHL 5-/5 right. Decreased sensation L5 dermatome.     Assessment/Plan Stenosis L45 right. Plan Decompression L45 right  Nashaun Hillmer C 05/18/2011, 12:24 PM

## 2011-05-18 NOTE — Anesthesia Postprocedure Evaluation (Signed)
  Anesthesia Post-op Note  Patient: [name]  Procedure(s) Performed:  LUMBAR LAMINECTOMY/DECOMPRESSION MICRODISCECTOMY - Decompression Lumbar 4-Lumbar 5  Right    (xray)   Patient Location: PACU  Anesthesia Type: General  Level of Consciousness: awake and alert   Airway and Oxygen Therapy: Patient Spontanous Breathing  Post-op Pain: mild  Post-op Assessment: Post-op Vital signs reviewed, Patient's Cardiovascular Status Stable, Respiratory Function Stable, Patent Airway and No signs of Nausea or vomiting  Post-op Vital Signs: stable  Complications: No apparent anesthesia complications

## 2011-05-18 NOTE — Transfer of Care (Signed)
Immediate Anesthesia Transfer of Care Note  Patient: [name]  Procedure(s) Performed:  LUMBAR LAMINECTOMY/DECOMPRESSION MICRODISCECTOMY - Decompression Lumbar 4-Lumbar 5  Right    (xray)   Patient Location: PACU  Anesthesia Type: General  Level of Consciousness: sedated, patient cooperative and responds to stimulaton  Airway & Oxygen Therapy: Patient Spontanous Breathing and Patient connected to face mask oxgen  Post-op Assessment: Report given to PACU RN and Post -op Vital signs reviewed and stable  Post vital signs: Reviewed and stable  Complications: No apparent anesthesia complications

## 2011-05-18 NOTE — Brief Op Note (Signed)
05/18/2011  2:19 PM  PATIENT:  [name]  50 y.o. female  PRE-OPERATIVE DIAGNOSIS:  stenosis Lumbar 4-Lumbar 5 Right   POST-OPERATIVE DIAGNOSIS:  stenosis Lumbar 4-Lumbar 5 right    PROCEDURE:  Procedure(s): LUMBAR LAMINECTOMY/DECOMPRESSION MICRODISCECTOMY  SURGEON:  Surgeon(s): Javier Docker  PHYSICIAN ASSISTANT:   ASSISTANTS: Roma Schanz   ANESTHESIA:   general  EBL:  Total I/O In: -  Out: 50 [Blood:50]  BLOOD ADMINISTERED:none  DRAINS: none   LOCAL MEDICATIONS USED:  MARCAINE 20CC  SPECIMEN:  No Specimen  DISPOSITION OF SPECIMEN:  N/A  COUNTS:  YES  TOURNIQUET:  * No tourniquets in log *  DICTATION: .Other Dictation: Dictation Number [id number]  PLAN OF CARE: Admit for overnight observation  PATIENT DISPOSITION:  PACU - hemodynamically stable.    [id number]

## 2011-05-18 NOTE — Anesthesia Preprocedure Evaluation (Signed)
Anesthesia Evaluation  Patient identified by MRN, date of birth, ID band Patient awake    Reviewed: Allergy & Precautions, H&P , NPO status , Patient's Chart, lab work & pertinent test results  History of Anesthesia Complications (+) PONV and Family history of anesthesia reaction  Airway Mallampati: II TM Distance: >3 FB Neck ROM: Full    Dental No notable dental hx.    Pulmonary neg pulmonary ROS,  clear to auscultation  Pulmonary exam normal       Cardiovascular neg cardio ROS Regular Normal    Neuro/Psych Negative Neurological ROS  Negative Psych ROS   GI/Hepatic negative GI ROS, Neg liver ROS,   Endo/Other  Negative Endocrine ROS  Renal/GU negative Renal ROS  Genitourinary negative   Musculoskeletal negative musculoskeletal ROS (+)   Abdominal   Peds negative pediatric ROS (+)  Hematology negative hematology ROS (+)   Anesthesia Other Findings   Reproductive/Obstetrics negative OB ROS                           Anesthesia Physical Anesthesia Plan  ASA: II  Anesthesia Plan: General   Post-op Pain Management:    Induction: Intravenous  Airway Management Planned: Oral ETT  Additional Equipment:   Intra-op Plan:   Post-operative Plan: Extubation in OR  Informed Consent: I have reviewed the patients History and Physical, chart, labs and discussed the procedure including the risks, benefits and alternatives for the proposed anesthesia with the patient or authorized representative who has indicated his/her understanding and acceptance.   Dental advisory given  Plan Discussed with: CRNA  Anesthesia Plan Comments: (H/o melanoma and parotid gland tumor. Plan multiple antiemetics)        Anesthesia Quick Evaluation

## 2011-05-18 NOTE — Progress Notes (Signed)
Pt arrived from PACU on stretcher, amb w/ steady gait and stand by assist to BR for void. Back to bed, oriented to callbell and environment. Denies any pain/disomfort at present. Callbell and bedside table in reach. Assessment as charted. Willmonitor.

## 2011-05-18 NOTE — Op Note (Signed)
[name], [name] NO.:  [id number]  MEDICAL RECORD NO.:  [id number]  LOCATION:  [facility]                         FACILITY:  [facility]  PHYSICIAN:  Jene Every, M.D.    DATE OF BIRTH:  03/26/1961  DATE OF PROCEDURE:  05/18/2011 DATE OF DISCHARGE:                              OPERATIVE REPORT   PREOPERATIVE DIAGNOSIS:  Spinal stenosis at L4-L5, right.  POSTOPERATIVE DIAGNOSIS:  Spinal stenosis at L4-L5, right.  PROCEDURE: 1. Micro decompression at L4-L5, right. 2. Foraminotomies at L4 and L5, right.  ANESTHESIA:  General.  ASSISTANT:  Roma Schanz, P.A.  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  She had MRI and CT myelogram, CT myelogram indicating truncation of the L5 nerve root on the right.  With predominantly L5 symptoms, we discussed L4-5 decompression, temporary relief from an epidural steroid injection and positive neurotension signs and EHL weakness consistent with pathology seen on CT myelogram.  She has had some left-sided symptoms, however, on the myelogram there was no nerve root encroachment.  She did have some facet arthropathy at L5 and at L5-S1, possibly referred.  She had negative neurotension signs.  We therefore felt this was probably facet- mediated and not amenable to a decompression.  We discussed decompression on the right.  Risks and benefits were discussed including bleeding, infection, damage to neurovascular structures, no change in symptoms, worsening symptoms, need for repeat decompression, DVT, PE, anesthetic complications etc.  TECHNIQUE:  With the patient in supine position, after induction of adequate general  anesthesia, 1 g vancomycin, she was placed prone on the [place] frame.  All bony prominences were well padded.  The lumbar region was prepped and draped in the usual sterile fashion.  Two 18- gauge spinal needles were utilized to  localize the L4-5 interspace, confirmed with x-ray.  Incision was made from the spinous process of L4- 5.  Subcutaneous tissue was dissected.  Electrocautery was utilized to achieve hemostasis.  Paraspinous muscles were elevated from L5-S1. McCullough retractor was placed, confirmatory radiograph obtained. Operating microscope was draped and brought on the surgical field. Hemilaminotomy performed at the caudad edge of L4, preserving the part. Ligamentum flavum was detached from the cephalad edge of 5 utilizing a straight curette.  Neuro patty was placed beneath the ligamentum flavum. I performed a foraminotomy at L5.  There was compression of L5 root in the lateral recess.  There was facet hypertrophy and ligamentum flavum hypertrophy compressing the L5 root into the lateral recess.  With the neural elements well protected I performed a decompression of the lateral recess to the medial border of the pedicle.  Partial medial facetectomy was performed approximately 10%, mostly ligamentum flavum. The disk was evaluated, it was not herniated.  We performed a slight hypertrophy of the superior articulating facet of L5, performed foraminotomy of L4.  Hockey-stick probe was placed, was passed freely up the foramen of L4 and L5.  We had a 1 cm protrusion of the pedicle without difficulty.  No disk herniation was noted.  Bipolar electrocautery was utilized to achieve hemostasis.  A hockey-stick was passed freely cephalad to pedicle of L4  and down below the pedicle of L5.  Confirmatory radiograph was obtained with the instrument in the foramen of L4 and L5.  The disk space was copiously irrigated with antibiotic irrigation.  Inspection revealed no CSF leakage or active bleeding.  We felt this was consistent with the pathology seen on the myelogram and her clinical symptomatology.  The McCullough retractor was removed.  Paraspinous muscles were inspected.  No active bleeding.  We irrigated and  closed the fascia with 0 Vicryl interrupted figure-of-8 sutures, subcu with 2-0 Vicryl simple sutures.  Skin was reapproximated with 4-0 subcuticular Prolene.  The wound was reinforced with Steri- Strips, sterile dressing applied, placed supine on the hospital bed, extubated without difficulty, and transported to recovery in satisfactory condition.  The patient tolerated the procedure well.  No complications.  Assistant Roma Schanz helped with intermittent neural traction and closure.  ESTIMATED BLOOD LOSS:  Minimal.     Jene Every, M.D.     JB/MEDQ  D:  05/18/2011  T:  05/18/2011  Job:  [id number]

## 2011-05-19 ENCOUNTER — Encounter (HOSPITAL_COMMUNITY): Payer: Self-pay | Admitting: Specialist

## 2011-05-19 MED ORDER — METHOCARBAMOL 500 MG PO TABS: 500.0000 mg | ORAL_TABLET | Freq: Four times a day (QID) | ORAL | Status: AC | PRN

## 2011-05-19 NOTE — Discharge Summary (Signed)
Physician Discharge Summary  Patient ID: [name] MRN: [id number] DOB/AGE: April 05, 1961 50 y.o.  Admit date: 05/18/2011 Discharge date: 05/19/2011  Admission Diagnoses:  Spinal stenosis  Discharge Diagnoses:  Spinal Stenosis s/p lumbar decompression L4-5 right   Discharged Condition: good  Hospital Course: Uneventful, Patient noted good relief of symptoms, was voiding and walking without difficulty   No results found for this basename: WBC:2,HCT:2,PLT:2 in the last 72 hours No results found for this basename: NA:2,K:2,CL:2,CO2:2,GLUCOSE:2,BUN:2,CREATININE:2,CALCIUM:2 in the last 72 hours  Procedure Note:S/P lumbar decompression L4-5 right  Consults: none  Disposition: Home or Self Care  Discharge Orders    Future Appointments: Provider: Department: Dept Phone: Center:   06/02/2011 2:10 PM [facility] [phone] [facility]     Future Orders Please Complete By Expires   Diet - low sodium heart healthy      Call MD / Call 911      Comments:   If you experience chest pain or shortness of breath, CALL 911 and be transported to the hospital emergency room.  If you develope a fever above 101 F, pus (white drainage) or increased drainage or redness at the wound, or calf pain, call your surgeon's office.   Constipation Prevention      Comments:   Drink plenty of fluids.  Prune juice may be helpful.  You may use a stool softener, such as Colace (over the counter) 100 mg twice a day.  Use MiraLax (over the counter) for constipation as needed.   Increase activity slowly as tolerated      Discharge instructions      Comments:   Walk As Tolerated utilizing back precautions.  No bending, twisting, or lifting.  No driving for 2 weeks.  Ok to shower in 72 hours.     Discharge Medication List as of 05/19/2011 10:06 AM    START taking these medications   Details  methocarbamol (ROBAXIN) 500 MG tablet Take 1 tablet (500 mg total) by mouth every 6 (six) hours as  needed., Starting 05/19/2011, Until Mon 05/29/11, Print      CONTINUE these medications which have CHANGED   Details  HYDROcodone-acetaminophen (NORCO) 5-325 MG per tablet Take 1-2 tablets by mouth every 4 (four) hours as needed for pain. Pain , Starting 05/18/2011, Until Discontinued, Print      CONTINUE these medications which have NOT CHANGED   Details  Calcium Citrate-Vitamin D (CALCIUM CITRATE + D PO) Take 1 tablet by mouth daily. , Until Discontinued, Historical Med       Follow-up Information    Follow up with BEANE,JEFFREY C. Make an appointment in 14 days.   Contact information:   [facility] [address] [place] [address] [phone]          Signed: Liam Graham. 05/19/2011, 11:54 AM

## 2011-05-19 NOTE — Progress Notes (Signed)
Subjective: 1 Day Post-Op Procedure(s) (LRB): LUMBAR LAMINECTOMY/DECOMPRESSION MICRODISCECTOMY (Right) Patient reports pain as 2 on 0-10 scale.    Patient has complaints of mild low back pain no LE pain is noted. Has been OOB and voiding without difficulty  Objective: Vital signs in last 24 hours: Temp:  [97.1 F (36.2 C)-98.2 F (36.8 C)] 98.2 F (36.8 C) (11/30 0510) Pulse Rate:  [65-95] 84  (11/30 0510) Resp:  [8-18] 18  (11/30 0510) BP: (98-163)/(61-91) 121/69 mmHg (11/30 0510) SpO2:  [94 %-100 %] 97 % (11/30 0510) Weight:  [74.39 kg (164 lb)] 164 lb (74.39 kg) (11/29 1615)  Intake/Output from previous day:  Intake/Output Summary (Last 24 hours) at 05/19/11 0824 Last data filed at 05/18/11 1810  Gross per 24 hour  Intake   1360 ml  Output    100 ml  Net   1260 ml    Intake/Output this shift:    Labs: Results for orders placed during the hospital encounter of 05/15/11  CBC      Component Value Range   WBC 3.2 (*) 4.0 - 10.5 (K/uL)   RBC 4.01  3.87 - 5.11 (MIL/uL)   Hemoglobin 12.6  12.0 - 15.0 (g/dL)   HCT 16.1 (*) 09.6 - 46.0 (%)   MCV 89.0  78.0 - 100.0 (fL)   MCH 31.4  26.0 - 34.0 (pg)   MCHC 35.3  30.0 - 36.0 (g/dL)   RDW 04.5  40.9 - 81.1 (%)   Platelets 155  150 - 400 (K/uL)  COMPREHENSIVE METABOLIC PANEL      Component Value Range   Sodium 139  135 - 145 (mEq/L)   Potassium 4.3  3.5 - 5.1 (mEq/L)   Chloride 104  96 - 112 (mEq/L)   CO2 29  19 - 32 (mEq/L)   Glucose, Bld 85  70 - 99 (mg/dL)   BUN 12  6 - 23 (mg/dL)   Creatinine, Ser 9.14  0.50 - 1.10 (mg/dL)   Calcium 9.9  8.4 - 78.2 (mg/dL)   Total Protein 7.1  6.0 - 8.3 (g/dL)   Albumin 4.1  3.5 - 5.2 (g/dL)   AST 19  0 - 37 (U/L)   ALT 11  0 - 35 (U/L)   Alkaline Phosphatase 62  39 - 117 (U/L)   Total Bilirubin 0.1 (*) 0.3 - 1.2 (mg/dL)   GFR calc non Af Amer >90  >90 (mL/min)   GFR calc Af Amer >90  >90 (mL/min)  DIFFERENTIAL      Component Value Range   Neutrophils Relative 59  43 - 77 (%)     Neutro Abs 1.9  1.7 - 7.7 (K/uL)   Lymphocytes Relative 28  12 - 46 (%)   Lymphs Abs 0.9  0.7 - 4.0 (K/uL)   Monocytes Relative 10  3 - 12 (%)   Monocytes Absolute 0.3  0.1 - 1.0 (K/uL)   Eosinophils Relative 2  0 - 5 (%)   Eosinophils Absolute 0.1  0.0 - 0.7 (K/uL)   Basophils Relative 0  0 - 1 (%)   Basophils Absolute 0.0  0.0 - 0.1 (K/uL)  PROTIME-INR      Component Value Range   Prothrombin Time 12.8  11.6 - 15.2 (seconds)   INR 0.94  0.00 - 1.49   URINALYSIS, ROUTINE W REFLEX MICROSCOPIC      Component Value Range   Color, Urine YELLOW  YELLOW    APPearance CLOUDY (*) CLEAR    Specific Gravity, Urine  1.015  1.005 - 1.030    pH 7.0  5.0 - 8.0    Glucose, UA NEGATIVE  NEGATIVE (mg/dL)   Hgb urine dipstick SMALL (*) NEGATIVE    Bilirubin Urine NEGATIVE  NEGATIVE    Ketones, ur NEGATIVE  NEGATIVE (mg/dL)   Protein, ur NEGATIVE  NEGATIVE (mg/dL)   Urobilinogen, UA 0.2  0.0 - 1.0 (mg/dL)   Nitrite NEGATIVE  NEGATIVE    Leukocytes, UA TRACE (*) NEGATIVE   SURGICAL PCR SCREEN      Component Value Range   MRSA, PCR NEGATIVE  NEGATIVE    Staphylococcus aureus NEGATIVE  NEGATIVE   URINE MICROSCOPIC-ADD ON      Component Value Range   Squamous Epithelial / LPF RARE  RARE    WBC, UA 0-2  <3 (WBC/hpf)   RBC / HPF 0-2  <3 (RBC/hpf)   Bacteria, UA MANY (*) RARE     Exam: Neurologically intact Neurovascular intact Sensation intact distally Dorsiflexion/Plantar flexion intact Compartment soft clean, dry Motor function intact - moving foot and toes well on exam.   Assessment/Plan: 1 Day Post-Op Procedure(s) (LRB): LUMBAR LAMINECTOMY/DECOMPRESSION MICRODISCECTOMY (Right)  Procedure(s) (LRB): LUMBAR LAMINECTOMY/DECOMPRESSION MICRODISCECTOMY (Right) Past Medical History  Diagnosis Date  . PONV (postoperative nausea and vomiting)   . Cancer 2001    melanoma  . 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 Kidney stones    Patient is  doing well so we will discharge to home Discussed d/c plans with pt,, activity levels, posture modifications She will f/u with [facility] in 14 days Condition is Stable  Vicodin/Robaxin/ASA    Jaz Mallick R. 05/19/2011, 8:24 AM

## 2011-05-19 NOTE — Progress Notes (Addendum)
Occupational Therapy Evaluation Patient Details Name: CROSBY BEVAN MRN: [id number] DOB: [birth year] Today's Date: 05/19/2011 Time in: 8:55 am Time out: 9:26 am Eval II  Problem List: There is no problem list on file for this patient.   Past Medical History:  Past Medical History  Diagnosis Date  . PONV (postoperative nausea and vomiting)   . Cancer 2001    melanoma  . 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 Kidney stones    Past Surgical History:  Past Surgical History  Procedure Date  . Breast surgery 2005- x 2 times    breast biopsies-bilateral  . Diagnostic laparoscopy 2010  . Kidney surgery at age 24    tubes from kidneys stretched to bladder-1966  . Melanoma excision with sentinel lymph node biopsy 2001    mohr's procedure    OT Assessment/Plan/Recommendation OT Assessment Clinical Impression Statement: Patient is modified independent/independent with all ADL. No further OT needs. All education completed. OT Recommendation/Assessment: Patient does not need any further OT services OT Goals    OT Evaluation Precautions/Restrictions  Precautions Precautions: Back Precaution Booklet Issued: Yes (comment) Precaution Comments: handout Required Braces or Orthoses: No Restrictions Weight Bearing Restrictions: No Prior Functioning Home Living Lives With: Spouse Receives Help From: Family Type of Home: House Home Layout: One level;Other (Comment) (has a basement. doesnt need to use) Home Access: Stairs to enter Entrance Stairs-Rails: Can reach both Entrance Stairs-Number of Steps: 3 Bathroom Shower/Tub: Health visitor: Handicapped height (vanity beside) Home Adaptive Equipment: Long-handled sponge Prior Function Level of Independence: Independent with basic ADLs;Independent with homemaking with ambulation;Independent with transfers;Independent with gait Driving: Yes Vocation: Full time  employment ADL ADL Eating/Feeding: Simulated;Independent Where Assessed - Eating/Feeding: Edge of bed Grooming: Performed;Independent Where Assessed - Grooming: Standing at sink Upper Body Bathing: Performed;Chest;Right arm;Left arm;Abdomen;Independent Where Assessed - Upper Body Bathing: Standing at sink Lower Body Bathing: Performed;Modified independent;Other (comment) (pt washed upper legs. Pt simulated wash with long brush ) Where Assessed - Lower Body Bathing: Standing at sink Upper Body Dressing: Performed;Independent Where Assessed - Upper Body Dressing: Standing Lower Body Dressing: Independent;Performed;Other (comment) (able to cross legs up to don over feet) Where Assessed - Lower Body Dressing: Sit to stand from bed Toilet Transfer: Performed;Modified independent Toilet Transfer Method: Proofreader: Comfort height toilet;Grab bars;Other (comment) (has vanity beside at home) Toileting - Clothing Manipulation: Simulated;Independent Where Assessed - Toileting Clothing Manipulation: Sit to stand from 3-in-1 or toilet Toileting - Hygiene: Simulated;Independent Where Assessed - Toileting Hygiene: Standing Tub/Shower Transfer: Performed;Modified independent Tub/Shower Transfer Method: Other (comment) (side step over shower ledge hold to wall) Equipment Used: Other (comment) (pt will have a long handle sponge at home) ADL Comments: Patient tolerated eval very well. Redemonstrated all tasks. Educated on all back precautions and issued back handout. Pt will have 24 hour assist at discharge. no further questions or concerns. Vision/Perception    Cognition Cognition Arousal/Alertness: Awake/alert Overall Cognitive Status: Appears within functional limits for tasks assessed Orientation Level: Oriented X4 Sensation/Coordination Sensation Light Touch: Appears Intact Extremity Assessment RUE Assessment RUE Assessment: Within Functional Limits LUE  Assessment LUE Assessment: Within Functional Limits Mobility  Bed Mobility Bed Mobility: Yes Supine to Sit: 7: Independent Exercises   End of Session OT - End of Session Activity Tolerance: Patient tolerated treatment well Patient left: in chair;with call bell in reach General Behavior During Session: [facility] for tasks performed Cognition: [facility] for tasks performed  Lennox Laity [phone] 05/19/2011, 11:11 AM

## 2011-05-19 NOTE — Progress Notes (Signed)
Physical Therapy Evaluation Patient Details Name: [name] MRN: [id number] DOB: 25-Apr-1961 Today's Date: 05/19/2011  Problem List: There is no problem list on file for this patient. [id number] Ev1  Past Medical History:  Past Medical History  Diagnosis Date  . PONV (postoperative nausea and vomiting)   . Cancer 2001    melanoma  . 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 Kidney stones    Past Surgical History:  Past Surgical History  Procedure Date  . Breast surgery 2005- x 2 times    breast biopsies-bilateral  . Diagnostic laparoscopy 2010  . Kidney surgery at age 65    tubes from kidneys stretched to bladder-1966  . Melanoma excision with sentinel lymph node biopsy 2001    mohr's procedure    PT Assessment/Plan/Recommendation PT Assessment Clinical Impression Statement: Patient s/p lumbar decompression/laminectomy presents with no further skilled PT needs due to will have assistance as needed by spouse at home and functioning with little to no assistance at this time.  She verbalized understanding of all precautions with mobility and daily activities. PT Recommendation/Assessment: Patent does not need any further PT services No Skilled PT: All education completed;Patient will have necessary level of assist by caregiver at discharge PT Goals     PT Evaluation Precautions/Restrictions  Precautions Precautions: Back Precaution Booklet Issued: Yes (comment) Precaution Comments: handout Required Braces or Orthoses: No Restrictions Weight Bearing Restrictions: No Prior Functioning  Home Living Lives With: Spouse Receives Help From: Family Type of Home: House Home Layout: One level Home Access: Stairs to enter Entrance Stairs-Rails: Lawyer of Steps: 3 Bathroom Shower/Tub: Health visitor: Handicapped height (vanity beside) Home Adaptive Equipment: Long-handled sponge Prior  Function Level of Independence: Independent with basic ADLs;Independent with homemaking with ambulation;Independent with gait;Independent with transfers Driving: Yes Vocation: Full time employment Cognition Cognition Arousal/Alertness: Awake/alert Overall Cognitive Status: Appears within functional limits for tasks assessed Orientation Level: Oriented X4 Sensation/Coordination Sensation Light Touch: Appears Intact Extremity Assessment  RLE Assessment RLE Assessment: Not tested LLE Assessment LLE Assessment: Not tested Mobility (including Balance) Bed Mobility Bed Mobility: No (patient up in chair) Transfers: Yes Sit to Stand: 4: Min assist;From chair/3-in-1 Sit to Stand Details (indicate cue type and reason): patient performed with spouse assist Ambulation/Gait Ambulation/Gait Assistance: 6: Modified independent (Device/Increase time) Ambulation Distance (Feet): 5 Feet (few steps in room, observed from hallway) Stairs: No (but was educated on technique with spouse assist)  Posture/Postural Control Posture/Postural Control: No significant limitations Education   Reviewed all education with patient regarding back precautions, sit <> stand, sit<>supine, car transfers, stairs, general exercise, lifiting restrictions with patient with spouse in the room.   End of Session PT - End of Session Activity Tolerance: Patient tolerated treatment well Patient left: in chair General Behavior During Session: [facility] for tasks performed Cognition: [facility] for tasks performed  [facility] 05/19/2011, 11:30 AM

## 2011-05-19 NOTE — Progress Notes (Signed)
D/C instructions reviewed w/ pt and SO. All ques answered, no further ques. Dsg to lower back changed. Steris intact, wound edges approximated. No erythema or drainage. Pt d/c in W/C by NT in stable condition. Pt in possession of d/c instructions, scripts, and all personal belongings.

## 2011-05-31 ENCOUNTER — Other Ambulatory Visit: Payer: Self-pay | Admitting: Otolaryngology

## 2011-06-02 ENCOUNTER — Ambulatory Visit
Admission: RE | Admit: 2011-06-02 | Discharge: 2011-06-02 | Disposition: A | Discharge status: Home / Self Care | Payer: BC Managed Care – PPO | Source: Ambulatory Visit | Attending: Obstetrics and Gynecology | Admitting: Obstetrics and Gynecology

## 2011-06-02 DIAGNOSIS — Z1231 Encounter for screening mammogram for malignant neoplasm of breast: Secondary | ICD-10-CM

## 2011-12-28 ENCOUNTER — Other Ambulatory Visit: Payer: Self-pay | Admitting: Otolaryngology

## 2011-12-28 DIAGNOSIS — C07 Malignant neoplasm of parotid gland: Secondary | ICD-10-CM

## 2011-12-28 DIAGNOSIS — H6980 Other specified disorders of Eustachian tube, unspecified ear: Secondary | ICD-10-CM

## 2011-12-29 ENCOUNTER — Ambulatory Visit
Admission: RE | Admit: 2011-12-29 | Discharge: 2011-12-29 | Disposition: A | Discharge status: Home / Self Care | Payer: BC Managed Care – PPO | Source: Ambulatory Visit | Attending: Otolaryngology | Admitting: Otolaryngology

## 2011-12-29 DIAGNOSIS — H6980 Other specified disorders of Eustachian tube, unspecified ear: Secondary | ICD-10-CM

## 2011-12-29 DIAGNOSIS — H6990 Unspecified Eustachian tube disorder, unspecified ear: Secondary | ICD-10-CM

## 2011-12-29 DIAGNOSIS — C07 Malignant neoplasm of parotid gland: Secondary | ICD-10-CM

## 2011-12-29 MED ORDER — IOHEXOL 300 MG/ML  SOLN
75.0000 mL | Freq: Once | INTRAMUSCULAR | Status: AC | PRN
  Administered 2011-12-29: 75 mL via INTRAVENOUS

## 2012-03-20 DIAGNOSIS — R112 Nausea with vomiting, unspecified: Secondary | ICD-10-CM

## 2012-03-20 DIAGNOSIS — C801 Malignant (primary) neoplasm, unspecified: Secondary | ICD-10-CM | POA: Insufficient documentation

## 2012-03-20 DIAGNOSIS — Z9889 Other specified postprocedural states: Secondary | ICD-10-CM | POA: Insufficient documentation

## 2012-03-20 DIAGNOSIS — D119 Benign neoplasm of major salivary gland, unspecified: Secondary | ICD-10-CM | POA: Insufficient documentation

## 2012-03-20 DIAGNOSIS — N200 Calculus of kidney: Secondary | ICD-10-CM | POA: Insufficient documentation

## 2012-03-21 ENCOUNTER — Ambulatory Visit (INDEPENDENT_AMBULATORY_CARE_PROVIDER_SITE_OTHER): Payer: BC Managed Care – PPO | Admitting: Cardiovascular Disease

## 2012-03-21 ENCOUNTER — Encounter: Payer: Self-pay | Admitting: Cardiovascular Disease

## 2012-03-21 VITALS — BP 132/77 | HR 73 | Wt 171.0 lb

## 2012-03-21 DIAGNOSIS — K219 Gastro-esophageal reflux disease without esophagitis: Secondary | ICD-10-CM

## 2012-03-21 DIAGNOSIS — R079 Chest pain, unspecified: Secondary | ICD-10-CM | POA: Insufficient documentation

## 2012-03-21 DIAGNOSIS — C801 Malignant (primary) neoplasm, unspecified: Secondary | ICD-10-CM

## 2012-03-21 MED ORDER — PANTOPRAZOLE SODIUM 40 MG PO TBEC: 40.0000 mg | DELAYED_RELEASE_TABLET | Freq: Every day | ORAL | Status: DC

## 2012-03-21 NOTE — Assessment & Plan Note (Signed)
Script for protonix called in F/U Dr Christell Constant

## 2012-03-21 NOTE — Progress Notes (Signed)
Patient ID: [name], female   DOB: 1961/02/11, 51 y.o.   MRN: [id number]  51 yo Dr Moore's  Has had SSCP x2 since dental procedure last week.  Initial episode a few hours after procedure.  GI overtones with indigestion No complete relief with tums or pepto.  2nd episode couple days latter Non exertional center chest with episode of vohmiting.  No history of PUD.  Had normal cath in [facility] about 8 years ago Chronically abnormal ECG No dyspnea palpitations or syncope.  She is under some family stress and has a new puppy she is aggravated by  ROS: Denies fever, malais, weight loss, blurry vision, decreased visual acuity, cough, sputum, SOB, hemoptysis, pleuritic pain, palpitaitons, heartburn, abdominal pain, melena, lower extremity edema, claudication, or rash.  All other systems reviewed and negative   General: Affect appropriate Healthy:  appears stated age HEENT: normal Neck supple with no adenopathy JVP normal no bruits no thyromegaly Lungs clear with no wheezing and good diaphragmatic motion Heart:  S1/S2 no murmur,rub, gallop or click PMI normal Abdomen: benighn, BS positve, no tenderness, no AAA no bruit.  No HSM or HJR Distal pulses intact with no bruits No edema Neuro non-focal Skin warm and dry  Previous melanoma and parotid gland surgery on right No muscular weakness  Medications Current Outpatient Prescriptions  Medication Sig Dispense Refill  . oxyCODONE-acetaminophen (PERCOCET/ROXICET) 5-325 MG per tablet Take 1 tablet by mouth every 4 (four) hours as needed.        Allergies Cephalexin  Family History: No family history on file.  Social History: History   Social History  . Marital Status: Married    Spouse Name: N/A    Number of Children: N/A  . Years of Education: N/A   Occupational History  . Not on file.   Social History Main Topics  . Smoking status: Never Smoker   . Smokeless tobacco: Not on file  . Alcohol Use: Yes     drinks occ.  .  Drug Use: No  . Sexually Active: Not on file   Other Topics Concern  . Not on file   Social History Narrative  . No narrative on file    Electrocardiogram:  NSR rate 73  LAD low voltage QRS  Anterolateral T wave changes  Assessment and Plan

## 2012-03-21 NOTE — Assessment & Plan Note (Signed)
Chest pain recurrent but atypical with gi overtones and abnormal ECG  F/U stress myovue

## 2012-03-21 NOTE — Patient Instructions (Addendum)
Your physician recommends that you schedule a follow-up appointment in: AFTER TEST Your physician has recommended you make the following change in your medication: START PROTONIX 40 MG 1 DAILY Your physician has requested that you have en exercise stress myoview. For further information please visit [email or web address] Please follow instruction sheet, as given. DX CHEST PAIN

## 2012-03-21 NOTE — Assessment & Plan Note (Signed)
Mild swelling right maxillary area.  Likely related to recent dental procedure.  F/U ENT and dentist

## 2012-03-27 ENCOUNTER — Ambulatory Visit (HOSPITAL_COMMUNITY): Payer: BC Managed Care – PPO | Attending: Cardiovascular Disease | Admitting: Radiology

## 2012-03-27 VITALS — BP 123/91 | HR 66 | Ht 64.0 in | Wt 170.0 lb

## 2012-03-27 DIAGNOSIS — R000 Tachycardia, unspecified: Secondary | ICD-10-CM | POA: Insufficient documentation

## 2012-03-27 DIAGNOSIS — R079 Chest pain, unspecified: Secondary | ICD-10-CM

## 2012-03-27 DIAGNOSIS — R42 Dizziness and giddiness: Secondary | ICD-10-CM | POA: Insufficient documentation

## 2012-03-27 DIAGNOSIS — I4949 Other premature depolarization: Secondary | ICD-10-CM

## 2012-03-27 DIAGNOSIS — R0602 Shortness of breath: Secondary | ICD-10-CM | POA: Insufficient documentation

## 2012-03-27 DIAGNOSIS — R0789 Other chest pain: Secondary | ICD-10-CM | POA: Insufficient documentation

## 2012-03-27 DIAGNOSIS — Z8249 Family history of ischemic heart disease and other diseases of the circulatory system: Secondary | ICD-10-CM | POA: Insufficient documentation

## 2012-03-27 DIAGNOSIS — R002 Palpitations: Secondary | ICD-10-CM | POA: Insufficient documentation

## 2012-03-27 MED ORDER — TECHNETIUM TC 99M SESTAMIBI GENERIC - CARDIOLITE
10.0000 | Freq: Once | INTRAVENOUS | Status: AC | PRN
  Administered 2012-03-27: 10 via INTRAVENOUS

## 2012-03-27 MED ORDER — TECHNETIUM TC 99M SESTAMIBI GENERIC - CARDIOLITE
30.0000 | Freq: Once | INTRAVENOUS | Status: AC | PRN
  Administered 2012-03-27: 30 via INTRAVENOUS

## 2012-03-27 NOTE — Progress Notes (Signed)
[facility] SITE 3 NUCLEAR MED [address] [id number] [place] [address] [phone]  Cardiology Nuclear Med Study  [name] is a 51 y.o. female     MRN : [id number]     DOB: [birth year]  Procedure Date: 03/27/2012  Nuclear Med Background Indication for Stress Test:  Evaluation for Ischemia History:  ~85yrs ago Cath:Normal per patient. Cardiac Risk Factors: Family History - CAD  Symptoms:  Chest Tightness with and without Exertion (last episode of chest discomfort is now, 2/10), Dizziness, Nausea, Palpitations, Rapid HR, Vomiting and SOB with Chest Tightness   Nuclear Pre-Procedure Caffeine/Decaff Intake:  None NPO After: 12:00am   Lungs:  Clear. O2 Sat: 96% on room air. IV 0.9% NS with Angio Cath:  22g  IV Site: R Hand  IV Started by:  Bonnita Levan, RN  Chest Size (in):  36 Cup Size: C  Height: 5\' 4"  (1.626 m)  Weight:  170 lb (77.111 kg)  BMI:  Body mass index is 29.18 kg/(m^2). Tech Comments:  N/A    Nuclear Med Study 1 or 2 day study: 1 day  Stress Test Type:  Stress  Reading MD: Kristeen Miss, MD  Order Authorizing Provider:  Charlton Haws, MD  Resting Radionuclide: Technetium 16m Sestamibi  Resting Radionuclide Dose: 11.0 mCi   Stress Radionuclide:  Technetium 31m Sestamibi  Stress Radionuclide Dose: 33.0 mCi           Stress Protocol Rest HR: 66 Stress HR: 153  Rest BP: 123/91 Stress BP: 190/77  Exercise Time (min): 7:15 METS: 8.9   Predicted Max HR: 167 bpm % Max HR: 91.62 bpm Rate Pressure Product: 40102   Dose of Adenosine (mg):  n/a Dose of Lexiscan: n/a mg  Dose of Atropine (mg): n/a Dose of Dobutamine: n/a   Stress Test Technologist: Smiley Houseman, CMA-N  Nuclear Technologist:  Domenic Polite, CNMT     Rest Procedure:  Myocardial perfusion imaging was performed at rest 45 minutes following the intravenous administration of Technetium 75m Sestamibi.  Rest ECG: Nonspecific T-wave changes with rare PAC.  Stress  Procedure:  The patient exercised on the treadmill utilizing the Bruce protocol for 7:15 minutes. She then stopped due to fatigue.  She c/o chest tightness prior to exercise, 2/10, that increased to 4-5/10 with exercise.  There were no diagnostic ST-T wave changes, frequent PAC's with a short burst of SVT was noted.  Technetium 74m Sestamibi was injected at peak exercise and myocardial perfusion imaging was performed after a brief delay.  Stress ECG: No significant change from baseline ECG  QPS Raw Data Images:  Normal; no motion artifact; normal heart/lung ratio. Stress Images:  Normal homogeneous uptake in all areas of the myocardium. Rest Images:  Normal homogeneous uptake in all areas of the myocardium. Subtraction (SDS):  No evidence of ischemia. Transient Ischemic Dilatation (Normal <1.22):  0.94 Lung/Heart Ratio (Normal <0.45):  0.29  Quantitative Gated Spect Images QGS EDV:  82 ml QGS ESV:  30 ml  Impression Exercise Capacity:  Good exercise capacity. BP Response:  Normal blood pressure response. Clinical Symptoms:  No significant symptoms noted. ECG Impression:  No significant ST segment change suggestive of ischemia. Comparison with Prior Nuclear Study: No previous nuclear study performed  Overall Impression:  Normal stress nuclear study.  No ischemia.   LV Ejection Fraction: 63%.  LV Wall Motion:  NL LV Function; NL Wall Motion.    Vesta Mixer, Montez Hageman., MD, [facility] 03/27/2012, 5:33 PM Office -  [phone] Pager [phone]

## 2012-03-29 ENCOUNTER — Telehealth: Payer: Self-pay | Admitting: Cardiovascular Disease

## 2012-03-29 NOTE — Telephone Encounter (Signed)
New Problem: ° ° ° °Patient returned your call.  Please call back. °

## 2012-03-29 NOTE — Telephone Encounter (Signed)
PT AWARE OF MYOVIEW RESULTS./CY 

## 2012-04-02 ENCOUNTER — Encounter: Payer: Self-pay | Admitting: Cardiovascular Disease

## 2012-04-02 ENCOUNTER — Ambulatory Visit (INDEPENDENT_AMBULATORY_CARE_PROVIDER_SITE_OTHER): Payer: BC Managed Care – PPO | Admitting: Cardiovascular Disease

## 2012-04-02 VITALS — BP 140/80 | HR 88 | Resp 18 | Ht 64.0 in | Wt 170.0 lb

## 2012-04-02 DIAGNOSIS — K219 Gastro-esophageal reflux disease without esophagitis: Secondary | ICD-10-CM

## 2012-04-02 DIAGNOSIS — I10 Essential (primary) hypertension: Secondary | ICD-10-CM

## 2012-04-02 MED ORDER — METOPROLOL SUCCINATE ER 25 MG PO TB24: 25.0000 mg | ORAL_TABLET | Freq: Every day | ORAL | Status: DC

## 2012-04-02 NOTE — Assessment & Plan Note (Signed)
Normal myovue will follow  She thinks a lot of her issues revolve around stress at work

## 2012-04-02 NOTE — Progress Notes (Signed)
Patient ID: [name], female   DOB: [birth year], 51 y.o.   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 Initial episode a few hours after procedure. GI overtones with indigestion No complete relief with tums or pepto. 2nd episode couple days latter Non exertional center chest with episode of vohmiting. No history of PUD. Had normal cath in [facility] about 8 years ago Chronically abnormal ECG  No dyspnea palpitations or syncope. She is under some family stress and has a new puppy she is aggravated by  Myovue 10/3 normal EF 65%  Short burst of PSVT  BP still borderline.  Still aggravated at work at [employer]   ROS: Denies fever, malais, weight loss, blurry vision, decreased visual acuity, cough, sputum, SOB, hemoptysis, pleuritic pain, palpitaitons, heartburn, abdominal pain, melena, lower extremity edema, claudication, or rash.  All other systems reviewed and negative  General: Affect appropriate Healthy:  appears stated age HEENT: normal Neck supple with no adenopathy JVP normal no bruits no thyromegaly Lungs clear with no wheezing and good diaphragmatic motion Heart:  S1/S2 no murmur, no rub, gallop or click PMI normal Abdomen: benighn, BS positve, no tenderness, no AAA no bruit.  No HSM or HJR Distal pulses intact with no bruits No edema Neuro non-focal Skin warm and dry No muscular weakness   Current Outpatient Prescriptions  Medication Sig Dispense Refill  . Aspirin-Acetaminophen-Caffeine (EXCEDRIN PO) Take by mouth. As needed      . diphenhydrAMINE (BENADRYL) 25 MG tablet Take 25 mg by mouth every 6 (six) hours as needed.      Marland Kitchen oxyCODONE-acetaminophen (PERCOCET/ROXICET) 5-325 MG per tablet Take 1 tablet by mouth every 4 (four) hours as needed.      . pantoprazole (PROTONIX) 40 MG tablet Take 1 tablet (40 mg total) by mouth daily.  30 tablet  11    Allergies  Cephalexin  Electrocardiogram:  Assessment and Plan

## 2012-04-02 NOTE — Assessment & Plan Note (Signed)
Elevated BP response to exercise and SVT in recovery.  Start generic low dose Toprol and follow

## 2012-04-02 NOTE — Assessment & Plan Note (Signed)
Continue protonix for 2 more weeks and then can stop May be related to pains

## 2012-04-02 NOTE — Patient Instructions (Signed)
Your physician recommends that you schedule a follow-up appointment in:  3 MONTHS WITH DR [place] [facility] Your physician has recommended you make the following change in your medication: ADD METOPROLOL 25 MG 1 TAB EVERY DAY

## 2012-05-02 ENCOUNTER — Other Ambulatory Visit: Payer: Self-pay | Admitting: Obstetrics and Gynecology

## 2012-05-02 DIAGNOSIS — Z1231 Encounter for screening mammogram for malignant neoplasm of breast: Secondary | ICD-10-CM

## 2012-05-23 ENCOUNTER — Other Ambulatory Visit (HOSPITAL_COMMUNITY): Payer: Self-pay | Admitting: Gastroenterology

## 2012-05-23 DIAGNOSIS — R1310 Dysphagia, unspecified: Secondary | ICD-10-CM

## 2012-05-24 ENCOUNTER — Ambulatory Visit (HOSPITAL_COMMUNITY)
Admission: RE | Admit: 2012-05-24 | Discharge: 2012-05-24 | Disposition: A | Discharge status: Home / Self Care | Payer: BC Managed Care – PPO | Source: Ambulatory Visit | Attending: Gastroenterology | Admitting: Gastroenterology

## 2012-05-24 DIAGNOSIS — R1310 Dysphagia, unspecified: Secondary | ICD-10-CM | POA: Insufficient documentation

## 2012-05-24 NOTE — Procedures (Signed)
Objective Swallowing Evaluation: Modified Barium Swallowing Study  Patient Details  Name: [name] MRN: [id number] Date of Birth: 1960-09-30  Today's Date: 05/24/2012 Time: 1315-1405 SLP Time Calculation (min): 50 min  Past Medical History:  Past Medical History  Diagnosis Date  . PONV (postoperative nausea and vomiting)   . Cancer 2001    melanoma  . 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 Kidney stones    Past Surgical History:  Past Surgical History  Procedure Date  . Breast surgery 2005- x 2 times    breast biopsies-bilateral  . Diagnostic laparoscopy 2010  . Kidney surgery at age 25    tubes from kidneys stretched to bladder-1966  . Melanoma excision with sentinel lymph node biopsy 2001    mohr's procedure  . Lumbar laminectomy/decompression microdiscectomy 05/18/2011    Procedure: LUMBAR LAMINECTOMY/DECOMPRESSION MICRODISCECTOMY;  Surgeon: Javier Docker;  Location: [facility];  Service: Orthopedics;  Laterality: Right;  Decompression Lumbar 4-Lumbar 5  Right    (xray)    HPI:  51 yo female referred by Dr Matthias Hughs for MBS due to pt with subjective pharyngeal dysphagia.  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  Pt reports sensation of food lodging in pharynx more than liquids requiring her to drink plenty of liquids to "wash it down".  She denies choking on foods or drinks , denies weight nor pulmonary infections.  Pt admits to occasional chest pain with intake but attributes this to job stress - not dysphagia.  Pt does not eat meats since her cancer mostly due to metallic taste  but also due to dysphagia.   She reports problems with crackers and breads.       Assessment / Plan / Recommendation Clinical Impression  Dysphagia Diagnosis:  (minimal oropharyngeal dysphagia) Clinical impression: Pt presents with minimal oropharyngeal dysphagia with only trace laryngeal penetration x1 of thin (to vocal  folds with reflexive effective throat clear).  Trace tongue base and vallecular stasis of liquids noted that cleared with DELAYED swallow.  At one time, pt did sense stasis in pharynx after cracker swallow, trace residuals appeared at distal esophagus and effectively cleared with thin bolus swallow-radiologist not present to confirm.   Suspect referrant sensation to pharynx.  SLP educated pt to precautions given her parotid cancer s/p radiation with fibrosis.  Also provided exercises/stretches to decr amount of fibrosis progression and tips to compensate for xerostomia.  Also recommend pt attempt chin tuck to determine if assist in future if pt coughing with liquids.  Also recommended attempt to conduct Supraglottic to aid airway protection if indicated.  No further SLP indicated.  Thanks for this referral.       Treatment Recommendation       Diet Recommendation Regular;Thin liquid (extra gravy and sauce)   Liquid Administration via: Straw (per pt due to dentition sensitivity) Medication Administration:  (as tolerated) Supervision: Patient able to self feed Compensations: Small sips/bites;Slow rate (start meals with liquids) Postural Changes and/or Swallow Maneuvers: Seated upright 90 degrees;Upright 30-60 min after meal    Other  Recommendations Oral Care Recommendations: Oral care BID   Follow Up Recommendations  None    Frequency and Duration        Pertinent Vitals/Pain N/a on room air    SLP Swallow Goals     General Date of Onset: 05/24/12 HPI: 51 yo female referred by Dr Matthias Hughs for MBS due to  pt with subjective pharyngeal dysphagia.  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  Pt reports sensation of food lodging in pharynx more than liquids requiring her to drink plenty of liquids to "wash it down".  She denies choking on foods or drinks , denies weight nor pulmonary infections.  Pt admits to occasional chest pain with intake but attributes this to  job stress - not dysphagia.  Pt does not eat meats since her cancer mostly due to metallic taste  but also due to dysphagia.   She reports problems with crackers and breads.   Type of Study: Modified Barium Swallowing Study Reason for Referral: Objectively evaluate swallowing function Diet Prior to this Study: Regular;Thin liquids (no meats) History of Recent Intubation: No Behavior/Cognition: Alert;Cooperative Oral Cavity - Dentition: Adequate natural dentition Oral Motor / Sensory Function: Impaired motor;Impaired sensory (mild right facial asymmetry) Oral impairment: Right facial Self-Feeding Abilities: Able to feed self Patient Positioning: Upright in chair Baseline Vocal Quality: Clear Volitional Cough: Strong Volitional Swallow: Able to elicit Anatomy: Within functional limits Pharyngeal Secretions: Not observed secondary MBS    Reason for Referral Objectively evaluate swallowing function   Oral Phase Oral Preparation/Oral Phase Oral Phase: Impaired Oral - Solids Oral - Pill: Reduced posterior propulsion;Delayed oral transit (w/oral stasis, cleared with more-likely d/t xerostomia) Oral Phase - Comment Oral Phase - Comment: trace coating of liquid barium without awareness, suspect minimal decr intraoral pressure   Pharyngeal Phase Pharyngeal Phase Pharyngeal Phase: Impaired Pharyngeal - Thin Pharyngeal - Thin Cup: Pharyngeal residue - valleculae;Penetration/Aspiration during swallow Penetration/Aspiration details (thin cup): Material enters airway, CONTACTS cords then ejected out (TRACE Penetration; cleared with reflexive throat clear) Pharyngeal - Thin Straw: Pharyngeal residue - valleculae (trace residuals) Pharyngeal - Solids Pharyngeal - Puree: Within functional limits Pharyngeal - Regular: Within functional limits Pharyngeal - Pill: Within functional limits Pharyngeal Phase - Comment Pharyngeal Comment: trace vallecular stasis without sensation, delayed swallow cleared   Cervical Esophageal Phase    GO    Cervical Esophageal Phase Cervical Esophageal Phase: Impaired Cervical Esophageal Phase - Comment Cervical Esophageal Comment: appearance of slight general narrowing:  did not impair barium flow of all consistencies    Functional Assessment Tool Used: MBS clinical judgement Functional Limitations: Swallowing Swallow Current Status (Z6109): 0 percent impaired, limited or restricted Swallow Goal Status (U0454): 0 percent impaired, limited or restricted Swallow Discharge Status (U9811): 0 percent impaired, limited or restricted    Donavan Burnet, MS [facility] SLP [phone]

## 2012-06-14 ENCOUNTER — Ambulatory Visit
Admission: RE | Admit: 2012-06-14 | Discharge: 2012-06-14 | Disposition: A | Discharge status: Home / Self Care | Payer: BC Managed Care – PPO | Source: Ambulatory Visit | Attending: Obstetrics and Gynecology | Admitting: Obstetrics and Gynecology

## 2012-06-14 DIAGNOSIS — Z1231 Encounter for screening mammogram for malignant neoplasm of breast: Secondary | ICD-10-CM

## 2012-06-26 ENCOUNTER — Encounter: Payer: Self-pay | Admitting: Cardiovascular Disease

## 2012-06-26 ENCOUNTER — Ambulatory Visit (INDEPENDENT_AMBULATORY_CARE_PROVIDER_SITE_OTHER): Payer: BC Managed Care – PPO | Admitting: Cardiovascular Disease

## 2012-06-26 VITALS — BP 149/74 | HR 74 | Ht 64.0 in | Wt 175.0 lb

## 2012-06-26 DIAGNOSIS — R079 Chest pain, unspecified: Secondary | ICD-10-CM

## 2012-06-26 DIAGNOSIS — I10 Essential (primary) hypertension: Secondary | ICD-10-CM

## 2012-06-26 NOTE — Assessment & Plan Note (Signed)
Non recurrent negative myovue stable

## 2012-06-26 NOTE — Progress Notes (Signed)
Patient ID: [name], female   DOB: 07/27/1960, 52 y.o.   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 .. Had normal cath in [facility] about 8 years ago Chronically abnormal ECG  No dyspnea palpitations or syncope. She is under some family stress and has a new puppy she is aggravated by  Myovue 10/3 normal EF 65% Short burst of PSVT BP still borderline. Still aggravated at work at [employer]  BP ? Running higher off metoprolol Needs to monitor at home  ROS: Denies fever, malais, weight loss, blurry vision, decreased visual acuity, cough, sputum, SOB, hemoptysis, pleuritic pain, palpitaitons, heartburn, abdominal pain, melena, lower extremity edema, claudication, or rash.  All other systems reviewed and negative  General: Affect appropriate Healthy:  appears stated age HEENT: normal Neck supple with no adenopathy JVP normal no bruits no thyromegaly Lungs clear with no wheezing and good diaphragmatic motion Heart:  S1/S2 no murmur, no rub, gallop or click PMI normal Abdomen: benighn, BS positve, no tenderness, no AAA no bruit.  No HSM or HJR Distal pulses intact with no bruits No edema Neuro non-focal Skin warm and dry No muscular weakness   Current Outpatient Prescriptions  Medication Sig Dispense Refill  . Aspirin-Acetaminophen-Caffeine (EXCEDRIN PO) Take by mouth. As needed      . diphenhydrAMINE (BENADRYL) 25 MG tablet Take 25 mg by mouth every 6 (six) hours as needed.      . metoprolol succinate (TOPROL-XL) 25 MG 24 hr tablet Take 1 tablet (25 mg total) by mouth daily.  90 tablet  3  . oxyCODONE-acetaminophen (PERCOCET/ROXICET) 5-325 MG per tablet Take 1 tablet by mouth every 4 (four) hours as needed.      . pantoprazole (PROTONIX) 40 MG tablet Take 1 tablet (40 mg total) by mouth daily.  30 tablet  11    Allergies  Cephalexin  Electrocardiogram: 03/21/12  SR rate 73 LAD poor R wave progression nonspecific ST/T wave changes  Assessment and  Plan

## 2012-06-26 NOTE — Patient Instructions (Signed)
Your physician recommends that you schedule a follow-up appointment in: 8 weeks with Dr Eden Emms.

## 2012-06-26 NOTE — Assessment & Plan Note (Signed)
F/U eight weeks monitor at home Resume beta blocker or ACE if high

## 2012-08-27 ENCOUNTER — Ambulatory Visit: Payer: BC Managed Care – PPO | Admitting: Cardiovascular Disease

## 2012-09-09 ENCOUNTER — Ambulatory Visit (INDEPENDENT_AMBULATORY_CARE_PROVIDER_SITE_OTHER): Payer: BC Managed Care – PPO | Admitting: Cardiovascular Disease

## 2012-09-09 VITALS — BP 140/82 | HR 76 | Ht 64.0 in | Wt 176.4 lb

## 2012-09-09 DIAGNOSIS — R079 Chest pain, unspecified: Secondary | ICD-10-CM

## 2012-09-09 DIAGNOSIS — I10 Essential (primary) hypertension: Secondary | ICD-10-CM

## 2012-09-09 NOTE — Progress Notes (Signed)
Patient ID: [name], female   DOB: 10-13-1960, 52 y.o.   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 .. Had normal cath in [facility] about 8 years ago Chronically abnormal ECG  No dyspnea palpitations or syncope. She is under some family stress and has a new puppy she is aggravated by  Myovue 10/3 normal EF 65% Short burst of PSVT BP still borderline. Still aggravated at work at [employer]   BP ? Running higher off metoprolol Needs to monitor at home  ROS: Denies fever, malais, weight loss, blurry vision, decreased visual acuity, cough, sputum, SOB, hemoptysis, pleuritic pain, palpitaitons, heartburn, abdominal pain, melena, lower extremity edema, claudication, or rash.  All other systems reviewed and negative  General: Affect appropriate Healthy:  appears stated age HEENT: normal Neck supple with no adenopathy JVP normal no bruits no thyromegaly Lungs clear with no wheezing and good diaphragmatic motion Heart:  S1/S2 no murmur, no rub, gallop or click PMI normal Abdomen: benighn, BS positve, no tenderness, no AAA no bruit.  No HSM or HJR Distal pulses intact with no bruits No edema Neuro non-focal Skin warm and dry No muscular weakness   Current Outpatient Prescriptions  Medication Sig Dispense Refill  . Aspirin-Acetaminophen-Caffeine (EXCEDRIN PO) Take by mouth. As needed      . diphenhydrAMINE (BENADRYL) 25 MG tablet Take 25 mg by mouth every 6 (six) hours as needed.      Marland Kitchen oxyCODONE-acetaminophen (PERCOCET/ROXICET) 5-325 MG per tablet Take 1 tablet by mouth every 4 (four) hours as needed.       No current facility-administered medications for this visit.    Allergies  Cephalexin  Electrocardiogram:  Assessment and Plan

## 2012-09-09 NOTE — Patient Instructions (Signed)
Your physician wants you to follow-up in:  6 MONTHS WITH DR NISHAN  You will receive a reminder letter in the mail two months in advance. If you don't receive a letter, please call our office to schedule the follow-up appointment. Your physician recommends that you continue on your current medications as directed. Please refer to the Current Medication list given to you today. 

## 2012-09-09 NOTE — Assessment & Plan Note (Signed)
Home BP readings are fine off beta blocker. Continue diet and lifestyle Rx

## 2012-09-09 NOTE — Assessment & Plan Note (Signed)
Normal myovue last year non recurrent no evidence of CAD at this time

## 2013-02-03 ENCOUNTER — Telehealth: Payer: Self-pay | Admitting: Family Medicine

## 2013-02-03 ENCOUNTER — Ambulatory Visit (INDEPENDENT_AMBULATORY_CARE_PROVIDER_SITE_OTHER): Payer: BC Managed Care – PPO | Admitting: General Practice

## 2013-02-03 ENCOUNTER — Ambulatory Visit (INDEPENDENT_AMBULATORY_CARE_PROVIDER_SITE_OTHER): Payer: BC Managed Care – PPO

## 2013-02-03 ENCOUNTER — Encounter: Payer: Self-pay | Admitting: General Practice

## 2013-02-03 VITALS — BP 144/82 | HR 65 | Temp 97.0°F | Ht 63.0 in | Wt 161.0 lb

## 2013-02-03 DIAGNOSIS — Z0000 Encounter for general adult medical examination without abnormal findings: Secondary | ICD-10-CM

## 2013-02-03 DIAGNOSIS — R3915 Urgency of urination: Secondary | ICD-10-CM

## 2013-02-03 DIAGNOSIS — M25559 Pain in unspecified hip: Secondary | ICD-10-CM

## 2013-02-03 DIAGNOSIS — R350 Frequency of micturition: Secondary | ICD-10-CM

## 2013-02-03 DIAGNOSIS — M25551 Pain in right hip: Secondary | ICD-10-CM

## 2013-02-03 LAB — POCT URINALYSIS DIPSTICK
Blood, UA: NEGATIVE
Ketones, UA: NEGATIVE
Leukocytes, UA: NEGATIVE
Protein, UA: NEGATIVE
pH, UA: 6

## 2013-02-03 LAB — POCT CBC
Granulocyte percent: 62.1 %G (ref 37–80)
HCT, POC: 37.4 % — AB (ref 37.7–47.9)
Hemoglobin: 12.9 g/dL (ref 12.2–16.2)
MPV: 6.6 fL (ref 0–99.8)
POC Granulocyte: 2 (ref 2–6.9)
RDW, POC: 12.7 %

## 2013-02-03 LAB — POCT UA - MICROSCOPIC ONLY
Casts, Ur, LPF, POC: NEGATIVE
Crystals, Ur, HPF, POC: NEGATIVE
Yeast, UA: NEGATIVE

## 2013-02-03 MED ORDER — CIPROFLOXACIN HCL 500 MG PO TABS: 500.0000 mg | ORAL_TABLET | Freq: Two times a day (BID) | ORAL | Status: DC

## 2013-02-03 NOTE — Patient Instructions (Addendum)
Urinary Tract Infection  Urinary tract infections (UTIs) can develop anywhere along your urinary tract. Your urinary tract is your body's drainage system for removing wastes and extra water. Your urinary tract includes two kidneys, two ureters, a bladder, and a urethra. Your kidneys are a pair of bean-shaped organs. Each kidney is about the size of your fist. They are located below your ribs, one on each side of your spine.  CAUSES  Infections are caused by microbes, which are microscopic organisms, including fungi, viruses, and bacteria. These organisms are so small that they can only be seen through a microscope. Bacteria are the microbes that most commonly cause UTIs.  SYMPTOMS   Symptoms of UTIs may vary by age and gender of the patient and by the location of the infection. Symptoms in young women typically include a frequent and intense urge to urinate and a painful, burning feeling in the bladder or urethra during urination. Older women and men are more likely to be tired, shaky, and weak and have muscle aches and abdominal pain. A fever may mean the infection is in your kidneys. Other symptoms of a kidney infection include pain in your back or sides below the ribs, nausea, and vomiting.  DIAGNOSIS  To diagnose a UTI, your caregiver will ask you about your symptoms. Your caregiver also will ask to provide a urine sample. The urine sample will be tested for bacteria and white blood cells. White blood cells are made by your body to help fight infection.  TREATMENT   Typically, UTIs can be treated with medication. Because most UTIs are caused by a bacterial infection, they usually can be treated with the use of antibiotics. The choice of antibiotic and length of treatment depend on your symptoms and the type of bacteria causing your infection.  HOME CARE INSTRUCTIONS   If you were prescribed antibiotics, take them exactly as your caregiver instructs you. Finish the medication even if you feel better after you  have only taken some of the medication.   Drink enough water and fluids to keep your urine clear or pale yellow.   Avoid caffeine, tea, and carbonated beverages. They tend to irritate your bladder.   Empty your bladder often. Avoid holding urine for long periods of time.   Empty your bladder before and after sexual intercourse.   After a bowel movement, women should cleanse from front to back. Use each tissue only once.  SEEK MEDICAL CARE IF:    You have back pain.   You develop a fever.   Your symptoms do not begin to resolve within 3 days.  SEEK IMMEDIATE MEDICAL CARE IF:    You have severe back pain or lower abdominal pain.   You develop chills.   You have nausea or vomiting.   You have continued burning or discomfort with urination.  MAKE SURE YOU:    Understand these instructions.   Will watch your condition.   Will get help right away if you are not doing well or get worse.  Document Released: 03/15/2005 Document Revised: 12/05/2011 Document Reviewed: 07/14/2011  ExitCare Patient Information 2014 [employer].

## 2013-02-03 NOTE — Progress Notes (Signed)
Subjective:    Patient ID: [name], female    DOB: 01-07-1961, 51 y.o.   MRN: [id number]  Urinary Tract Infection  This is a new problem. The current episode started in the past 7 days. The problem occurs every urination. The problem has been gradually worsening. The quality of the pain is described as burning. The pain is at a severity of 5/10. The pain is mild. There has been no fever. She is not sexually active. There is no history of pyelonephritis. Associated symptoms include a discharge, frequency and urgency. Pertinent negatives include no chills or hematuria. She has tried acetaminophen for the symptoms. Her past medical history is significant for recurrent UTIs. history of cancer of parotid gland 2008  Otalgia  There is pain in the right ear. This is a new problem. The current episode started 1 to 4 weeks ago. The problem occurs constantly. The problem has been gradually worsening. There has been no fever. The pain is at a severity of 7/10. Associated symptoms include headaches. Pertinent negatives include no abdominal pain, coughing, rhinorrhea or sore throat. history of cancer of parotid gland 2008  Patient complains of right hip pain which began 1 month ago. She reports pain as burning and denies injury. Denies similar pain in the past. Reports taking excedrin for headaches and hip pain every 4 hours, with minimal relief.   Review of Systems  Constitutional: Negative for chills.  HENT: Positive for ear pain and sneezing. Negative for sore throat, rhinorrhea, postnasal drip and sinus pressure.   Eyes: Negative for pain.  Respiratory: Negative for cough.   Gastrointestinal: Negative for abdominal pain.  Genitourinary: Positive for urgency and frequency. Negative for hematuria.  Neurological: Positive for dizziness and headaches. Negative for weakness.       Objective:   Physical Exam  Constitutional: She is oriented to person, place, and time. She appears well-developed and  well-nourished.  HENT:  Head: Normocephalic and atraumatic.  Right Ear: External ear normal.  Left Ear: External ear normal.  Eyes: EOM are normal.  Cardiovascular: Normal rate, regular rhythm and normal heart sounds.   Pulmonary/Chest: Effort normal and breath sounds normal.  Neurological: She is alert and oriented to person, place, and time.  Skin: Skin is warm and dry.  Psychiatric: She has a normal mood and affect.   Results for orders placed in visit on 02/03/13  POCT URINALYSIS DIPSTICK      Result Value Range   Color, UA gold     Clarity, UA clear     Glucose, UA negative     Bilirubin, UA negative     Ketones, UA negative     Spec Grav, UA 1.015     Blood, UA negative     pH, UA 6.0     Protein, UA negative     Urobilinogen, UA negative     Nitrite, UA negative     Leukocytes, UA Negative     [facility] reading (PRIMARY) by Coralie Keens, FNP-C, degenerative disc changes, no dislocation or fracture noted.                                          Assessment & Plan:  1. Frequency of urination and 2. Urgency of urination  - POCT urinalysis dipstick - POCT UA - Microscopic Only - ciprofloxacin (CIPRO) 500 MG tablet; Take 1 tablet (500 mg  total) by mouth 2 (two) times daily.  Dispense: 20 tablet; Refill: 0 -Increase fluid intake AZO over the counter X2 days Frequent voiding Proper perineal hygiene  3. Right hip pain - DG Hip Complete Right; Future  4. Annual physical exam - POCT CBC - NMR, lipoprofile - CMP14+EGFR 5. Ear Pain -irrigated right ear, minimal cerumen removed -offered referral to ENT, but patient declined (will continue OTC medication and call if no improvement) -RTO or seek emergency medical treatment if symptoms worsen -Patient verbalized understanding -Coralie Keens, FNP-C

## 2013-02-03 NOTE — Telephone Encounter (Signed)
APPT MADE

## 2013-02-04 LAB — NMR, LIPOPROFILE
Cholesterol: 204 mg/dL — ABNORMAL HIGH (ref <200)
LDL Particle Number: 1441 nmol/L — ABNORMAL HIGH (ref <1000)
LDLC SERPL CALC-MCNC: 134 mg/dL — ABNORMAL HIGH (ref <100)
LP-IR Score: 25 (ref <=45)

## 2013-02-04 LAB — CMP14+EGFR
AST: 18 IU/L (ref 0–40)
Albumin/Globulin Ratio: 2.5 (ref 1.1–2.5)
Albumin: 4.5 g/dL (ref 3.5–5.5)
BUN: 15 mg/dL (ref 6–24)
Calcium: 9.3 mg/dL (ref 8.7–10.2)
Creatinine, Ser: 0.62 mg/dL (ref 0.57–1.00)
GFR calc non Af Amer: 103 mL/min/{1.73_m2} (ref >59)
Globulin, Total: 1.8 g/dL (ref 1.5–4.5)
Sodium: 143 mmol/L (ref 134–144)
Total Protein: 6.3 g/dL (ref 6.0–8.5)

## 2013-02-10 ENCOUNTER — Telehealth: Payer: Self-pay | Admitting: *Deleted

## 2013-02-10 NOTE — Telephone Encounter (Signed)
Pt aware of lab results. She will continue with dieting and exercising.

## 2013-02-24 ENCOUNTER — Ambulatory Visit (INDEPENDENT_AMBULATORY_CARE_PROVIDER_SITE_OTHER): Payer: BC Managed Care – PPO | Admitting: Family Medicine

## 2013-02-24 ENCOUNTER — Encounter: Payer: Self-pay | Admitting: Family Medicine

## 2013-02-24 VITALS — BP 157/88 | HR 73 | Temp 97.0°F | Wt 158.2 lb

## 2013-02-24 DIAGNOSIS — R350 Frequency of micturition: Secondary | ICD-10-CM

## 2013-02-24 DIAGNOSIS — R3915 Urgency of urination: Secondary | ICD-10-CM

## 2013-02-24 DIAGNOSIS — N200 Calculus of kidney: Secondary | ICD-10-CM

## 2013-02-24 DIAGNOSIS — R319 Hematuria, unspecified: Secondary | ICD-10-CM

## 2013-02-24 LAB — POCT UA - MICROSCOPIC ONLY
Casts, Ur, LPF, POC: NEGATIVE
Crystals, Ur, HPF, POC: NEGATIVE
Mucus, UA: NEGATIVE
Yeast, UA: NEGATIVE

## 2013-02-24 LAB — POCT URINALYSIS DIPSTICK
Bilirubin, UA: NEGATIVE
Glucose, UA: NEGATIVE
Ketones, UA: NEGATIVE
Leukocytes, UA: NEGATIVE
Nitrite, UA: NEGATIVE
Protein, UA: NEGATIVE
Spec Grav, UA: 1.01
Urobilinogen, UA: NEGATIVE
pH, UA: 5

## 2013-02-24 MED ORDER — CIPROFLOXACIN HCL 500 MG PO TABS: 500.0000 mg | ORAL_TABLET | Freq: Two times a day (BID) | ORAL | Status: DC

## 2013-02-24 MED ORDER — PHENAZOPYRIDINE HCL 200 MG PO TABS: 200.0000 mg | ORAL_TABLET | Freq: Three times a day (TID) | ORAL | Status: DC | PRN

## 2013-02-24 NOTE — Progress Notes (Signed)
Patient ID: [name], female   DOB: [birth year], 52 y.o.   MRN: [id number] SUBJECTIVE: CC: Chief Complaint  Patient presents with  . Urinary Tract Infection    passing blood  when urinates pain started yest and passed blood alal evening. . low back pain..    . Acute Visit    right ear pain states has been treated  for ear with irrigation recently by mee     HPI: Seen about 1 month ago for dysuria. Now having hematuria and back ache whenever she urinates. Has a history of kidney stones but this is not the same. She feels like she is having a bladder problem. No fever. No chills. No vaginal discharge, no nausea, no vomiting.   Past Medical History  Diagnosis Date  . PONV (postoperative nausea and vomiting)   . Cancer 2001    melanoma  . 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 Kidney stones    Past Surgical History  Procedure Laterality Date  . Breast surgery  2005- x 2 times    breast biopsies-bilateral  . Diagnostic laparoscopy  2010  . Kidney surgery  at age 64    tubes from kidneys stretched to bladder-1966  . Melanoma excision with sentinel lymph node biopsy  2001    mohr's procedure  . Lumbar laminectomy/decompression microdiscectomy  05/18/2011    Procedure: LUMBAR LAMINECTOMY/DECOMPRESSION MICRODISCECTOMY;  Surgeon: Javier Docker;  Location: [facility];  Service: Orthopedics;  Laterality: Right;  Decompression Lumbar 4-Lumbar 5  Right    (xray)    History   Social History  . Marital Status: Married    Spouse Name: N/A    Number of Children: N/A  . Years of Education: N/A   Occupational History  . Not on file.   Social History Main Topics  . Smoking status: Never Smoker   . Smokeless tobacco: Not on file  . Alcohol Use: Yes     Comment: drinks occ.  . Drug Use: No  . Sexual Activity: Not on file   Other Topics Concern  . Not on file   Social History Narrative  . No narrative on file   Family History  Problem  Relation Age of Onset  . Hypertension Mother   . COPD Mother   . Cancer Mother     breast  . Dementia Mother   . Heart disease Father   . Cancer Father     Colorectal  . Hyperlipidemia Father   . Hypertension Father    Current Outpatient Prescriptions on File Prior to Visit  Medication Sig Dispense Refill  . Aspirin-Acetaminophen-Caffeine (EXCEDRIN PO) Take by mouth. As needed      . diphenhydrAMINE (BENADRYL) 25 MG tablet Take 25 mg by mouth every 6 (six) hours as needed.       No current facility-administered medications on file prior to visit.   Allergies  Allergen Reactions  . Cephalexin Shortness Of Breath and Rash    There is no immunization history on file for this patient. Prior to Admission medications   Medication Sig Start Date End Date Taking? Authorizing Provider  Aspirin-Acetaminophen-Caffeine (EXCEDRIN PO) Take by mouth. As needed   Yes Historical Provider, MD  diphenhydrAMINE (BENADRYL) 25 MG tablet Take 25 mg by mouth every 6 (six) hours as needed.   Yes Historical Provider, MD     ROS: As above in the HPI. All other systems are stable or negative.  OBJECTIVE: APPEARANCE:  Patient in no acute distress.The patient appeared well nourished and normally developed. Acyanotic. Waist: VITAL SIGNS:BP 157/88  Pulse 73  Temp(Src) 97 F (36.1 C) (Oral)  Wt 158 lb 3.2 oz (71.759 kg)  BMI 28.03 kg/m2  LMP 05/18/2011  WF  SKIN: warm and  Dry without overt rashes, tattoos and scars  HEAD and Neck: without JVD, Head and scalp: normal Eyes:No scleral icterus. Fundi normal, eye movements normal. Ears: Auricle normal, canal normal, Tympanic membranes normal, insufflation normal. Nose: normal Throat: normal Neck & thyroid: abnormal. She has had surgery on the right side of the neck in the past with removal of Lymph node as part of her assessment and treatment of cancer.  CHEST & LUNGS: Chest wall: normal Lungs: Clear  CVS: Reveals the PMI to be normally  located. Regular rhythm, First and Second Heart sounds are normal,  absence of murmurs, rubs or gallops. Peripheral vasculature: Radial pulses: normal Dorsal pedis pulses: normal Posterior pulses: normal  ABDOMEN:  Appearance: normal Benign, no organomegaly, no masses, no Abdominal Aortic enlargement. No Guarding , no rebound. No Bruits. Bowel sounds: normal. No cva tenderness.  RECTAL: N/A GU: N/A  EXTREMETIES: nonedematous.   NEUROLOGIC: oriented to time,place and person; nonfocal.  Results for orders placed in visit on 02/24/13  POCT UA - MICROSCOPIC ONLY      Result Value Range   WBC, Ur, HPF, POC 1-5     RBC, urine, microscopic tntc     Bacteria, U Microscopic occ     Mucus, UA negative     Epithelial cells, urine per micros occ     Crystals, Ur, HPF, POC negative     Casts, Ur, LPF, POC negative     Yeast, UA negatiove    POCT URINALYSIS DIPSTICK      Result Value Range   Color, UA yellow     Clarity, UA clear     Glucose, UA negative     Bilirubin, UA negative     Ketones, UA negative     Spec Grav, UA 1.010     Blood, UA large     pH, UA 5.0     Protein, UA negative     Urobilinogen, UA negative     Nitrite, UA negative     Leukocytes, UA Negative      ASSESSMENT: Blood in urine - Plan: POCT UA - Microscopic Only, POCT urinalysis dipstick, Urine culture, phenazopyridine (PYRIDIUM) 200 MG tablet  Hematuria - Plan: phenazopyridine (PYRIDIUM) 200 MG tablet  Kidney stones  Frequency of urination - Plan: ciprofloxacin (CIPRO) 500 MG tablet  Urgency of urination - Plan: ciprofloxacin (CIPRO) 500 MG tablet Discussed with patient that this could be hemorrhagic  Cystitis vs Renal calculi vs some other etiology. however , she states that the discomfort is not severe, and she thinks it is due to an infection. Will teat as an infection and recheck in 3 days. however if not better will need to re-evaluate. Strain all urine.  PLAN:   Orders Placed This  Encounter  Procedures  . Urine culture  . POCT UA - Microscopic Only  . POCT urinalysis dipstick    Meds ordered this encounter  Medications  . ciprofloxacin (CIPRO) 500 MG tablet    Sig: Take 1 tablet (500 mg total) by mouth 2 (two) times daily.    Dispense:  20 tablet    Refill:  0    Order Specific Question:  Supervising Provider  Answer:  Ernestina Penna [[id number]]  . phenazopyridine (PYRIDIUM) 200 MG tablet    Sig: Take 1 tablet (200 mg total) by mouth 3 (three) times daily as needed for pain.    Dispense:  10 tablet    Refill:  0   Increase fluid / water with lemon juice added.  Return in about 3 days (around 02/27/2013) for Recheck urinary tract infection.  Dezaree Tracey P. Modesto Charon, M.D.

## 2013-02-25 LAB — URINE CULTURE

## 2013-02-27 ENCOUNTER — Other Ambulatory Visit (INDEPENDENT_AMBULATORY_CARE_PROVIDER_SITE_OTHER): Payer: BC Managed Care – PPO

## 2013-02-27 DIAGNOSIS — N390 Urinary tract infection, site not specified: Secondary | ICD-10-CM

## 2013-02-27 LAB — POCT URINALYSIS DIPSTICK

## 2013-02-27 LAB — POCT UA - MICROSCOPIC ONLY
Bacteria, U Microscopic: NEGATIVE
Casts, Ur, LPF, POC: NEGATIVE
Crystals, Ur, HPF, POC: NEGATIVE
Mucus, UA: NEGATIVE
Yeast, UA: NEGATIVE

## 2013-02-28 LAB — URINE CULTURE

## 2013-03-05 ENCOUNTER — Telehealth: Payer: Self-pay | Admitting: Family Medicine

## 2013-03-05 NOTE — Telephone Encounter (Signed)
In result note

## 2013-03-08 ENCOUNTER — Emergency Department (HOSPITAL_COMMUNITY): Payer: BC Managed Care – PPO

## 2013-03-08 ENCOUNTER — Encounter (HOSPITAL_COMMUNITY): Payer: Self-pay

## 2013-03-08 ENCOUNTER — Emergency Department (HOSPITAL_COMMUNITY)
Admission: EM | Admit: 2013-03-08 | Discharge: 2013-03-08 | Disposition: A | Discharge status: Home / Self Care | Payer: BC Managed Care – PPO | Attending: Emergency Medicine | Admitting: Emergency Medicine

## 2013-03-08 DIAGNOSIS — Z8639 Personal history of other endocrine, nutritional and metabolic disease: Secondary | ICD-10-CM | POA: Insufficient documentation

## 2013-03-08 DIAGNOSIS — N200 Calculus of kidney: Secondary | ICD-10-CM | POA: Insufficient documentation

## 2013-03-08 DIAGNOSIS — Z862 Personal history of diseases of the blood and blood-forming organs and certain disorders involving the immune mechanism: Secondary | ICD-10-CM | POA: Insufficient documentation

## 2013-03-08 DIAGNOSIS — Z8614 Personal history of Methicillin resistant Staphylococcus aureus infection: Secondary | ICD-10-CM | POA: Insufficient documentation

## 2013-03-08 DIAGNOSIS — R6883 Chills (without fever): Secondary | ICD-10-CM | POA: Insufficient documentation

## 2013-03-08 DIAGNOSIS — Z85820 Personal history of malignant melanoma of skin: Secondary | ICD-10-CM | POA: Insufficient documentation

## 2013-03-08 DIAGNOSIS — R112 Nausea with vomiting, unspecified: Secondary | ICD-10-CM | POA: Insufficient documentation

## 2013-03-08 DIAGNOSIS — Z9889 Other specified postprocedural states: Secondary | ICD-10-CM | POA: Insufficient documentation

## 2013-03-08 DIAGNOSIS — R319 Hematuria, unspecified: Secondary | ICD-10-CM | POA: Insufficient documentation

## 2013-03-08 DIAGNOSIS — Z792 Long term (current) use of antibiotics: Secondary | ICD-10-CM | POA: Insufficient documentation

## 2013-03-08 LAB — URINALYSIS, ROUTINE W REFLEX MICROSCOPIC
Ketones, ur: NEGATIVE mg/dL
Nitrite: NEGATIVE
Protein, ur: 100 mg/dL — AB
Urobilinogen, UA: 0.2 mg/dL (ref 0.0–1.0)
pH: 6 (ref 5.0–8.0)

## 2013-03-08 LAB — CBC WITH DIFFERENTIAL/PLATELET
Basophils Relative: 0 % (ref 0–1)
Eosinophils Absolute: 0.1 10*3/uL (ref 0.0–0.7)
Eosinophils Relative: 2 % (ref 0–5)
Hemoglobin: 13.1 g/dL (ref 12.0–15.0)
MCH: 31.3 pg (ref 26.0–34.0)
MCHC: 34.7 g/dL (ref 30.0–36.0)
MCV: 90.4 fL (ref 78.0–100.0)
Monocytes Relative: 9 % (ref 3–12)
Neutro Abs: 2.8 10*3/uL (ref 1.7–7.7)
Neutrophils Relative %: 66 % (ref 43–77)
RDW: 12.3 % (ref 11.5–15.5)

## 2013-03-08 LAB — COMPREHENSIVE METABOLIC PANEL
Albumin: 3.9 g/dL (ref 3.5–5.2)
Alkaline Phosphatase: 56 U/L (ref 39–117)
BUN: 15 mg/dL (ref 6–23)
Calcium: 9.4 mg/dL (ref 8.4–10.5)
Chloride: 101 mEq/L (ref 96–112)
Creatinine, Ser: 0.7 mg/dL (ref 0.50–1.10)
GFR calc Af Amer: >90 mL/min (ref >90)
Glucose, Bld: 98 mg/dL (ref 70–99)
Potassium: 3.6 mEq/L (ref 3.5–5.1)
Sodium: 138 mEq/L (ref 135–145)
Total Bilirubin: 0.4 mg/dL (ref 0.3–1.2)
Total Protein: 6.5 g/dL (ref 6.0–8.3)

## 2013-03-08 LAB — URINE MICROSCOPIC-ADD ON

## 2013-03-08 MED ORDER — HYDROCODONE-ACETAMINOPHEN 5-325 MG PO TABS: 1.0000 | ORAL_TABLET | Freq: Four times a day (QID) | ORAL | Status: DC | PRN

## 2013-03-08 MED ORDER — ONDANSETRON HCL 4 MG/2ML IJ SOLN
4.0000 mg | Freq: Once | INTRAMUSCULAR | Status: AC
  Administered 2013-03-08: 4 mg via INTRAVENOUS
  Filled 2013-03-08: qty 2

## 2013-03-08 MED ORDER — ONDANSETRON 4 MG PO TBDP: ORAL_TABLET | ORAL | Status: DC

## 2013-03-08 NOTE — ED Provider Notes (Signed)
CSN: [id number]     Arrival date & time 03/08/13  1457 History  This chart was scribed for  by Allene Dillon, ED Scribe. This patient was seen in room APA18/APA18 and the patient's care was started at 3:23 PM.    Chief Complaint  Patient presents with  . Dysuria   Patient is a 52 y.o. female presenting with abdominal pain. The history is provided by the patient. No language interpreter was used.  Abdominal Pain Pain location:  LLQ Pain quality: sharp   Pain radiates to:  Does not radiate Pain severity:  Mild Onset quality:  Gradual Duration:  2 weeks Timing:  Intermittent Progression:  Unchanged Chronicity:  New Relieved by: Percocet and Vicodin  Worsened by:  Nothing tried Ineffective treatments: Cipro and Perdiem. Associated symptoms: chills, dysuria, hematuria, nausea and vomiting   Associated symptoms: no chest pain, no cough, no diarrhea, no fatigue and no fever   HPI Comments: [name] is a 52 y.o. female who presents to the Emergency Department complaining of two weeks of intermittent, sharp, LLQ abdominal pain with dysuria and hematuria. Pt has associated chills, emesis, and nausea. Pt reports she seen her PCP twice and lab cultures are negative. She was given Cipro and Perdiem with no relief. She has taken Percocet and Vicodin with relief. Pt denies fever. Pt reports she was recently exposed to MRSA.     Past Medical History  Diagnosis Date  . PONV (postoperative nausea and vomiting)   . Cancer 2001    melanoma  . 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 Kidney stones    Past Surgical History  Procedure Laterality Date  . Breast surgery  2005- x 2 times    breast biopsies-bilateral  . Diagnostic laparoscopy  2010  . Kidney surgery  at age 31    tubes from kidneys stretched to bladder-1966  . Melanoma excision with sentinel lymph node biopsy  2001    mohr's procedure  . Lumbar laminectomy/decompression  microdiscectomy  05/18/2011    Procedure: LUMBAR LAMINECTOMY/DECOMPRESSION MICRODISCECTOMY;  Surgeon: Javier Docker;  Location: [facility];  Service: Orthopedics;  Laterality: Right;  Decompression Lumbar 4-Lumbar 5  Right    (xray)   . 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 Hypertension Mother   . COPD Mother   . Cancer Mother     breast  . Dementia Mother   . Heart disease Father   . Cancer Father     Colorectal  . Hyperlipidemia Father   . Hypertension Father    History  Substance Use Topics  . Smoking status: Never Smoker   . Smokeless tobacco: Not on file  . Alcohol Use: No   OB History   Grav Para Term Preterm Abortions TAB SAB Ect Mult Living                 Review of Systems  Constitutional: Positive for chills. Negative for fever, appetite change and fatigue.  HENT: Negative for congestion, sinus pressure and ear discharge.   Eyes: Negative for discharge.  Respiratory: Negative for cough.   Cardiovascular: Negative for chest pain.  Gastrointestinal: Positive for nausea, vomiting and abdominal pain. Negative for diarrhea.  Genitourinary: Positive for dysuria and hematuria. Negative for frequency.  Musculoskeletal: Negative for back pain.  Skin: Negative for rash.  Neurological: Negative for  seizures and headaches.  Psychiatric/Behavioral: Negative for hallucinations.    Allergies  Cephalexin  Home Medications   Current Outpatient Rx  Name  Route  Sig  Dispense  Refill  . Aspirin-Acetaminophen-Caffeine (EXCEDRIN PO)   Oral   Take by mouth. As needed         . ciprofloxacin (CIPRO) 500 MG tablet   Oral   Take 1 tablet (500 mg total) by mouth 2 (two) times daily.   20 tablet   0   . diphenhydrAMINE (BENADRYL) 25 MG tablet   Oral   Take 25 mg by mouth every 6 (six) hours as needed.         . phenazopyridine (PYRIDIUM) 200 MG tablet   Oral   Take 1 tablet (200 mg total) by mouth 3  (three) times daily as needed for pain.   10 tablet   0    Triage Vitals: BP 138/84  Pulse 73  Temp(Src) 98.1 F (36.7 C) (Oral)  Resp 18  Ht 5\' 3"  (1.6 m)  Wt 149 lb (67.586 kg)  BMI 26.4 kg/m2  SpO2 99%  LMP 05/18/2011 Physical Exam  Constitutional: She is oriented to person, place, and time. She appears well-developed.  HENT:  Head: Normocephalic.  Eyes: Conjunctivae and EOM are normal. No scleral icterus.  Neck: Neck supple. No thyromegaly present.  Cardiovascular: Normal rate and regular rhythm.  Exam reveals no gallop and no friction rub.   No murmur heard. Pulmonary/Chest: No stridor. She has no wheezes. She has no rales. She exhibits no tenderness.  Abdominal: She exhibits no distension. There is tenderness. There is no rebound.  Mild LLQ tenderness.   Musculoskeletal: Normal range of motion. She exhibits no edema.  Lymphadenopathy:    She has no cervical adenopathy.  Neurological: She is oriented to person, place, and time. Coordination normal.  Skin: No rash noted. No erythema.  Psychiatric: She has a normal mood and affect. Her behavior is normal.    ED Course  Procedures (including critical care time) DIAGNOSTIC STUDIES: Oxygen Saturation is 99% on RA, normal by my interpretation.    COORDINATION OF CARE: 3:30 PM- Will provide blood urine test and nausea medication. Pt advised of plan for treatment and pt agrees.  Labs Review Labs Reviewed - No data to display Imaging Review No results found.  MDM  No diagnosis found. Kidney stones   The chart was scribed for me under my direct supervision.  I personally performed the history, physical, and medical decision making and all procedures in the evaluation of this patient.Benny Lennert, MD 03/08/13 [phone]

## 2013-03-08 NOTE — ED Notes (Signed)
Pt taking cipro, vicodin, percocet, and pyridium for possible UTI or Kidney stone x 2 weeks.    Pt says pt still has hematuria and sharp LLQ pain.  Also c/o n/v.   Pt also said she was told that her mother whom she has been helping with has MRSA in her urine.

## 2013-03-09 LAB — URINE CULTURE

## 2013-03-12 ENCOUNTER — Other Ambulatory Visit: Payer: Self-pay | Admitting: Urology

## 2013-03-14 ENCOUNTER — Ambulatory Visit (INDEPENDENT_AMBULATORY_CARE_PROVIDER_SITE_OTHER): Payer: BC Managed Care – PPO | Admitting: Cardiovascular Disease

## 2013-03-14 ENCOUNTER — Encounter: Payer: Self-pay | Admitting: Cardiovascular Disease

## 2013-03-14 VITALS — BP 146/76 | HR 75 | Ht 63.0 in | Wt 155.0 lb

## 2013-03-14 DIAGNOSIS — R079 Chest pain, unspecified: Secondary | ICD-10-CM

## 2013-03-14 DIAGNOSIS — N200 Calculus of kidney: Secondary | ICD-10-CM

## 2013-03-14 DIAGNOSIS — I10 Essential (primary) hypertension: Secondary | ICD-10-CM

## 2013-03-14 NOTE — Patient Instructions (Signed)
Your physician wants you to follow-up in: YEAR WITH DR NISHAN  You will receive a reminder letter in the mail two months in advance. If you don't receive a letter, please call our office to schedule the follow-up appointment.  Your physician recommends that you continue on your current medications as directed. Please refer to the Current Medication list given to you today. 

## 2013-03-14 NOTE — Assessment & Plan Note (Signed)
Clear to have surgery with Dr Margarita Grizzle

## 2013-03-14 NOTE — Assessment & Plan Note (Signed)
Resolved Normal myovue 10/13  Chronically abnormal ECG  Clear to have urologic surgery

## 2013-03-14 NOTE — Progress Notes (Signed)
Patient ID: [name], female   DOB: [birth year], 52 y.o.   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 .. Had normal cath in [facility] about 8 years ago Chronically abnormal ECG  No dyspnea palpitations or syncope. She is under some family stress and has a new puppy she is aggravated by  Myovue 03/21/12  normal EF 65% Short burst of PSVT BP still borderline. Still aggravated at work at [employer]  BP running better including home readings  Needs surgery for renal stones that have been bothering her for a month No cardiac reasons not to proceed   ROS: Denies fever, malais, weight loss, blurry vision, decreased visual acuity, cough, sputum, SOB, hemoptysis, pleuritic pain, palpitaitons, heartburn, abdominal pain, melena, lower extremity edema, claudication, or rash.  All other systems reviewed and negative  General: Affect appropriate Healthy:  appears stated age HEENT: normal Neck supple with no adenopathy JVP normal no bruits no thyromegaly Lungs clear with no wheezing and good diaphragmatic motion Heart:  S1/S2 no murmur, no rub, gallop or click PMI normal Abdomen: benighn, BS positve, no tenderness, no AAA no bruit.  No HSM or HJR Distal pulses intact with no bruits No edema Neuro non-focal Skin warm and dry No muscular weakness   Current Outpatient Prescriptions  Medication Sig Dispense Refill  . Aspirin-Acetaminophen-Caffeine (EXCEDRIN PO) Take 1-2 tablets by mouth daily as needed (for pain/headache). As needed      . ciprofloxacin (CIPRO) 500 MG tablet Take 1 tablet (500 mg total) by mouth 2 (two) times daily.  20 tablet  0  . Cyanocobalamin (B-12 PO) Take 1 tablet by mouth daily.      . diphenhydrAMINE (BENADRYL) 25 MG tablet Take 25 mg by mouth every 6 (six) hours as needed.      Chilton Si Tea, Camillia sinensis, (GREEN TEA EXTRACT PO) Take 1 tablet by mouth daily.      Marland Kitchen HYDROcodone-acetaminophen (NORCO/VICODIN) 5-325 MG per tablet Take 1 tablet by  mouth every 6 (six) hours as needed for pain.      Marland Kitchen HYDROcodone-acetaminophen (NORCO/VICODIN) 5-325 MG per tablet Take 1 tablet by mouth every 6 (six) hours as needed for pain.  20 tablet  0  . Multiple Vitamin (MULTIVITAMIN WITH MINERALS) TABS tablet Take 1 tablet by mouth daily.      . ondansetron (ZOFRAN ODT) 4 MG disintegrating tablet 4mg  ODT q6 hours prn nausea/vomit  12 tablet  0  . phenazopyridine (PYRIDIUM) 200 MG tablet Take 1 tablet (200 mg total) by mouth 3 (three) times daily as needed for pain.  10 tablet  0   No current facility-administered medications for this visit.    Allergies  Cephalexin  Electrocardiogram:  03/21/12  SR rate 73  PAC poor R wave progression nonspecific ST/T Wave changes   Assessment and Plan

## 2013-03-14 NOTE — Assessment & Plan Note (Signed)
Well controlled.  Continue current medications and low sodium Dash type diet.    

## 2013-03-17 ENCOUNTER — Encounter (HOSPITAL_COMMUNITY): Payer: Self-pay | Admitting: *Deleted

## 2013-03-19 ENCOUNTER — Ambulatory Visit (HOSPITAL_COMMUNITY): Payer: BC Managed Care – PPO | Admitting: Anesthesiology

## 2013-03-19 ENCOUNTER — Encounter (HOSPITAL_COMMUNITY)
Admission: RE | Disposition: A | Discharge status: Home / Self Care | Payer: Self-pay | Source: Ambulatory Visit | Attending: Urology

## 2013-03-19 ENCOUNTER — Ambulatory Visit (HOSPITAL_COMMUNITY)
Admission: RE | Admit: 2013-03-19 | Discharge: 2013-03-19 | Disposition: A | Discharge status: Home / Self Care | Payer: BC Managed Care – PPO | Source: Ambulatory Visit | Attending: Urology | Admitting: Urology

## 2013-03-19 ENCOUNTER — Encounter (HOSPITAL_COMMUNITY): Payer: Self-pay | Admitting: *Deleted

## 2013-03-19 ENCOUNTER — Encounter (HOSPITAL_COMMUNITY): Payer: Self-pay | Admitting: Anesthesiology

## 2013-03-19 DIAGNOSIS — N201 Calculus of ureter: Secondary | ICD-10-CM | POA: Insufficient documentation

## 2013-03-19 DIAGNOSIS — N200 Calculus of kidney: Secondary | ICD-10-CM

## 2013-03-19 DIAGNOSIS — Z85819 Personal history of malignant neoplasm of unspecified site of lip, oral cavity, and pharynx: Secondary | ICD-10-CM | POA: Insufficient documentation

## 2013-03-19 DIAGNOSIS — Z85820 Personal history of malignant melanoma of skin: Secondary | ICD-10-CM | POA: Insufficient documentation

## 2013-03-19 HISTORY — PX: CYSTOSCOPY WITH RETROGRADE PYELOGRAM, URETEROSCOPY AND STENT PLACEMENT: SHX5789

## 2013-03-19 HISTORY — DX: Unspecified osteoarthritis, unspecified site: M19.90

## 2013-03-19 HISTORY — DX: Unspecified asthma, uncomplicated: J45.909

## 2013-03-19 LAB — CBC
HCT: 33.5 % — ABNORMAL LOW (ref 36.0–46.0)
Hemoglobin: 11.7 g/dL — ABNORMAL LOW (ref 12.0–15.0)
MCV: 89.6 fL (ref 78.0–100.0)
RBC: 3.74 MIL/uL — ABNORMAL LOW (ref 3.87–5.11)
WBC: 2.8 10*3/uL — ABNORMAL LOW (ref 4.0–10.5)

## 2013-03-19 SURGERY — CYSTOURETEROSCOPY, WITH RETROGRADE PYELOGRAM AND STENT INSERTION
Anesthesia: General | Site: Ureter | Laterality: Bilateral | Wound class: Clean Contaminated

## 2013-03-19 MED ORDER — OXYCODONE-ACETAMINOPHEN 5-325 MG PO TABS: 1.0000 | ORAL_TABLET | ORAL | Status: DC | PRN

## 2013-03-19 MED ORDER — LACTATED RINGERS IV SOLN
INTRAVENOUS | Status: DC
  Administered 2013-03-19: 15:00:00 via INTRAVENOUS
  Administered 2013-03-19: 1000 mL via INTRAVENOUS

## 2013-03-19 MED ORDER — CIPROFLOXACIN IN D5W 400 MG/200ML IV SOLN
400.0000 mg | INTRAVENOUS | Status: AC
  Administered 2013-03-19: 400 mg via INTRAVENOUS

## 2013-03-19 MED ORDER — PROPOFOL 10 MG/ML IV BOLUS
INTRAVENOUS | Status: DC | PRN
  Administered 2013-03-19: 150 mg via INTRAVENOUS

## 2013-03-19 MED ORDER — SENNOSIDES-DOCUSATE SODIUM 8.6-50 MG PO TABS: 1.0000 | ORAL_TABLET | Freq: Two times a day (BID) | ORAL | Status: DC

## 2013-03-19 MED ORDER — CIPROFLOXACIN HCL 500 MG PO TABS: 500.0000 mg | ORAL_TABLET | Freq: Two times a day (BID) | ORAL | Status: DC

## 2013-03-19 MED ORDER — ONDANSETRON HCL 4 MG/2ML IJ SOLN
INTRAMUSCULAR | Status: DC | PRN
  Administered 2013-03-19: 4 mg via INTRAVENOUS

## 2013-03-19 MED ORDER — LIDOCAINE HCL 2 % EX GEL
CUTANEOUS | Status: AC
  Filled 2013-03-19: qty 10

## 2013-03-19 MED ORDER — SODIUM CHLORIDE 0.9 % IR SOLN
Status: DC | PRN
  Administered 2013-03-19: 4000 mL via INTRAVESICAL

## 2013-03-19 MED ORDER — OXYCODONE HCL 5 MG PO TABS
5.0000 mg | ORAL_TABLET | Freq: Once | ORAL | Status: AC | PRN
  Administered 2013-03-19: 5 mg via ORAL
  Filled 2013-03-19: qty 1

## 2013-03-19 MED ORDER — GLYCOPYRROLATE 0.2 MG/ML IJ SOLN
INTRAMUSCULAR | Status: DC | PRN
  Administered 2013-03-19: 0.6 mg via INTRAVENOUS

## 2013-03-19 MED ORDER — BELLADONNA ALKALOIDS-OPIUM 16.2-60 MG RE SUPP
RECTAL | Status: DC | PRN
  Administered 2013-03-19: 1 via RECTAL

## 2013-03-19 MED ORDER — FENTANYL CITRATE 0.05 MG/ML IJ SOLN
INTRAMUSCULAR | Status: DC | PRN
  Administered 2013-03-19: 50 ug via INTRAVENOUS
  Administered 2013-03-19 (×5): 25 ug via INTRAVENOUS

## 2013-03-19 MED ORDER — CISATRACURIUM BESYLATE (PF) 10 MG/5ML IV SOLN
INTRAVENOUS | Status: DC | PRN
  Administered 2013-03-19: 2 mg via INTRAVENOUS

## 2013-03-19 MED ORDER — HYDROMORPHONE HCL PF 1 MG/ML IJ SOLN
0.2500 mg | INTRAMUSCULAR | Status: DC | PRN
  Administered 2013-03-19 (×2): 0.5 mg via INTRAVENOUS

## 2013-03-19 MED ORDER — IOHEXOL 300 MG/ML  SOLN
INTRAMUSCULAR | Status: DC | PRN
  Administered 2013-03-19: 10 mL

## 2013-03-19 MED ORDER — LIDOCAINE HCL 2 % EX GEL
CUTANEOUS | Status: DC | PRN
  Administered 2013-03-19: 1 via URETHRAL

## 2013-03-19 MED ORDER — PROMETHAZINE HCL 25 MG/ML IJ SOLN: 6.2500 mg | INTRAMUSCULAR | Status: DC | PRN

## 2013-03-19 MED ORDER — NEOSTIGMINE METHYLSULFATE 1 MG/ML IJ SOLN
INTRAMUSCULAR | Status: DC | PRN
  Administered 2013-03-19: 4 mg via INTRAVENOUS

## 2013-03-19 MED ORDER — HYDROMORPHONE HCL PF 1 MG/ML IJ SOLN
INTRAMUSCULAR | Status: AC
  Filled 2013-03-19: qty 1

## 2013-03-19 MED ORDER — 0.9 % SODIUM CHLORIDE (POUR BTL) OPTIME
TOPICAL | Status: DC | PRN
  Administered 2013-03-19: 1000 mL

## 2013-03-19 MED ORDER — OXYCODONE HCL 5 MG/5ML PO SOLN
5.0000 mg | Freq: Once | ORAL | Status: AC | PRN
  Filled 2013-03-19: qty 5

## 2013-03-19 MED ORDER — HYOSCYAMINE SULFATE 0.125 MG PO TABS: 0.1250 mg | ORAL_TABLET | ORAL | Status: DC | PRN

## 2013-03-19 MED ORDER — CIPROFLOXACIN IN D5W 400 MG/200ML IV SOLN
INTRAVENOUS | Status: AC
  Filled 2013-03-19: qty 200

## 2013-03-19 MED ORDER — DEXAMETHASONE SODIUM PHOSPHATE 10 MG/ML IJ SOLN
INTRAMUSCULAR | Status: DC | PRN
  Administered 2013-03-19: 10 mg via INTRAVENOUS

## 2013-03-19 MED ORDER — BELLADONNA ALKALOIDS-OPIUM 16.2-60 MG RE SUPP
RECTAL | Status: AC
  Filled 2013-03-19: qty 1

## 2013-03-19 MED ORDER — PHENAZOPYRIDINE HCL 100 MG PO TABS: 100.0000 mg | ORAL_TABLET | Freq: Three times a day (TID) | ORAL | Status: DC | PRN

## 2013-03-19 MED ORDER — OXYBUTYNIN CHLORIDE 5 MG PO TABS: 5.0000 mg | ORAL_TABLET | Freq: Four times a day (QID) | ORAL | Status: DC | PRN

## 2013-03-19 MED ORDER — SUCCINYLCHOLINE CHLORIDE 20 MG/ML IJ SOLN
INTRAMUSCULAR | Status: DC | PRN
  Administered 2013-03-19: 100 mg via INTRAVENOUS

## 2013-03-19 MED ORDER — MIDAZOLAM HCL 5 MG/5ML IJ SOLN
INTRAMUSCULAR | Status: DC | PRN
  Administered 2013-03-19 (×2): 1 mg via INTRAVENOUS

## 2013-03-19 MED ORDER — MEPERIDINE HCL 50 MG/ML IJ SOLN: 6.2500 mg | INTRAMUSCULAR | Status: DC | PRN

## 2013-03-19 SURGICAL SUPPLY — 39 items
BAG URO CATCHER STRL LF (DRAPE) ×3 IMPLANT
BASKET LASER NITINOL 1.9FR (BASKET) IMPLANT
BASKET STNLS GEMINI 4WIRE 3FR (BASKET) IMPLANT
BASKET ZERO TIP NITINOL 2.4FR (BASKET) ×3 IMPLANT
BRUSH URET BIOPSY 3F (UROLOGICAL SUPPLIES) IMPLANT
BSKT STON RTRVL ZERO TP 2.4FR (BASKET) ×1
CATH CLEAR GEL 3F BACKSTOP (CATHETERS) IMPLANT
CATH URET 5FR 28IN CONE TIP (BALLOONS)
CATH URET 5FR 28IN OPEN ENDED (CATHETERS) ×3 IMPLANT
CATH URET 5FR 70CM CONE TIP (BALLOONS) IMPLANT
CATH URET DUAL LUMEN 6-10FR 50 (CATHETERS) IMPLANT
CLOTH BEACON ORANGE TIMEOUT ST (SAFETY) ×3 IMPLANT
DRAPE CAMERA CLOSED 9X96 (DRAPES) ×3 IMPLANT
FIBER LASER FLEXIVA 200 (UROLOGICAL SUPPLIES) ×3 IMPLANT
FIBER LASER FLEXIVA 365 (UROLOGICAL SUPPLIES) IMPLANT
GLIDEWIRE 0.025 SS STRAIGHT (WIRE) ×3 IMPLANT
GLOVE BIOGEL M 7.0 STRL (GLOVE) IMPLANT
GLOVE ECLIPSE 7.0 STRL STRAW (GLOVE) ×3 IMPLANT
GLOVE SURG SS PI 8.0 STRL IVOR (GLOVE) ×3 IMPLANT
GOWN PREVENTION PLUS LG XLONG (DISPOSABLE) ×3 IMPLANT
GOWN PREVENTION PLUS XLARGE (GOWN DISPOSABLE) ×3 IMPLANT
GOWN STRL REIN XL XLG (GOWN DISPOSABLE) ×3 IMPLANT
GUIDEWIRE ANG ZIPWIRE 038X150 (WIRE) IMPLANT
GUIDEWIRE STR DUAL SENSOR (WIRE) ×3 IMPLANT
IV NS IRRIG 3000ML ARTHROMATIC (IV SOLUTION) ×3 IMPLANT
KIT BALLIN UROMAX 15FX10 (LABEL) IMPLANT
KIT BALLN UROMAX 15FX4 (MISCELLANEOUS) IMPLANT
KIT BALLN UROMAX 26 75X4 (MISCELLANEOUS)
MANIFOLD NEPTUNE II (INSTRUMENTS) ×3 IMPLANT
MARKER SKIN DUAL TIP RULER LAB (MISCELLANEOUS) IMPLANT
PACK CYSTO (CUSTOM PROCEDURE TRAY) ×3 IMPLANT
SCRUB PCMX 4 OZ (MISCELLANEOUS) IMPLANT
SET HIGH PRES BAL DIL (LABEL)
SHEATH ACCESS URETERAL 38CM (SHEATH) ×3 IMPLANT
SHEATH URET ACCESS 12FR/35CM (UROLOGICAL SUPPLIES) IMPLANT
SHEATH URET ACCESS 12FR/55CM (UROLOGICAL SUPPLIES) IMPLANT
STENT CONTOUR 6FRX24X.038 (STENTS) ×3 IMPLANT
SYRINGE IRR TOOMEY STRL 70CC (SYRINGE) IMPLANT
TUBING CONNECTING 10 (TUBING) ×3 IMPLANT

## 2013-03-19 NOTE — Anesthesia Preprocedure Evaluation (Addendum)
Anesthesia Evaluation  Patient identified by MRN, date of birth, ID band Patient awake    Reviewed: Allergy & Precautions, H&P , NPO status , Patient's Chart, lab work & pertinent test results  History of Anesthesia Complications (+) PONV and Family history of anesthesia reaction  Airway Mallampati: II TM Distance: >3 FB Neck ROM: Full    Dental no notable dental hx. (+) Dental Advisory Given   Pulmonary asthma ,  breath sounds clear to auscultation  Pulmonary exam normal       Cardiovascular hypertension, Pt. on medications + dysrhythmias Rhythm:Regular Rate:Normal     Neuro/Psych  Headaches, negative psych ROS   GI/Hepatic Neg liver ROS, GERD-  ,  Endo/Other  negative endocrine ROS  Renal/GU Renal disease     Musculoskeletal negative musculoskeletal ROS (+)   Abdominal   Peds  Hematology negative hematology ROS (+)   Anesthesia Other Findings   Reproductive/Obstetrics negative OB ROS                           Anesthesia Physical  Anesthesia Plan  ASA: II  Anesthesia Plan: General   Post-op Pain Management:    Induction: Intravenous  Airway Management Planned: Oral ETT and Video Laryngoscope Planned  Additional Equipment:   Intra-op Plan:   Post-operative Plan: Extubation in OR  Informed Consent: I have reviewed the patients History and Physical, chart, labs and discussed the procedure including the risks, benefits and alternatives for the proposed anesthesia with the patient or authorized representative who has indicated his/her understanding and acceptance.   Dental advisory given  Plan Discussed with: CRNA  Anesthesia Plan Comments: (H/o melanoma and parotid gland tumor. Plan multiple antiemetics. Possible glidescope due to hx of parotidectomy and XRT. )       Anesthesia Quick Evaluation

## 2013-03-19 NOTE — Progress Notes (Signed)
Phase 2 in Short Stay. Report given to Lizabeth Leyden RN by me

## 2013-03-19 NOTE — Op Note (Signed)
Urology Operative Report  Date of Procedure: 03/19/13  Surgeon: Natalia Leatherwood, MD Assistant:  None  Preoperative Diagnosis: Right ureter stone. Left ureter stone. Right nephrolithiasis. Postoperative Diagnosis: Right ureter stone. Left ureter stone. Right nephrolithiasis.   Procedure(s): Right ureteroscopy with stone removal. Left ureteroscopy with laser lithotripsy. Left ureter stent placement (6 x 24). Cystoscopy.  Estimated blood loss:  Minimal  Specimen: Stones sent to [facility] lab for chemical analysis.  Drains: None  Complications: None  Findings: Significant left distal ureter edema. Left distal ureter stone embedded in left ureter mucosa. Right nephrolithiasis.  History of present illness: 52 year old female presents today for bilateral ureter stones. She also has bilateral nephrolithiasis. She states that her right side has been more symptomatically in her left.   Procedure in detail: After informed consent was obtained, the patient was taken to the operating room. They were placed in the supine position. SCDs were turned on and in place. IV antibiotics were infused, and general anesthesia was induced. A timeout was performed in which the correct patient, surgical site, and procedure were identified and agreed upon by the team.  The patient was placed in a dorsolithotomy position, making sure to pad all pertinent neurovascular pressure points. The genitals were prepped and draped in the usual sterile fashion.  A rigid cystoscope was advanced through the urethra and into the bladder. The bladder was drained. It was then fully distended and evaluated in a systematic fashion to visualize the entire surface of the bladder. This was negative for bladder tumors. Both ureter orifices were divided. The left had significant edema.   Attention was turned to the right ureter orifice. It was cannulated with a 5 [place] ureter catheter. I injected contrast to obtain a retrograde  pyelogram. This showed no filling defects or hydronephrosis. I knew that the patient had a very small stone in the proximal ureter on the side that could possibly be missed with a retrograde. Her CT scan also had no hydronephrosis therefore the stone to still be present without Hydro. I elected to perform ureteroscopy to ensure stone fragments were cleared. The patient was paralyzed. The safety wire was placed up the right ureter orifice and to the right renal pelvis on fluoroscopy. I attempted to place a semirigid ureteroscope into the distal ureter but this was not successful. I then placed the obturator of a 12-14 French ureter access sheath over the wire under fluoroscopy. This was only placed into the distal ureter and it went in with ease. I then removed the obturator leaving the safety wire in place. The semirigid ureter scope was easily passed into the distal ureter and up into the proximal ureter but I could not visualize the ureter pelvic junction where the stone was on CT scan. I then withdrew the semirigid scope and advanced a flexible digital ureter scope up the ureter and into the right kidney. The right kidney was evaluated in a systematic fashion to visualize every calyx. There were 2 stones in the inferior pole of the right kidney. This corresponds to a previously seen right ureter stone and right lower pole renal stone on CT scan. These were both grasped with a 0 tip Nitinol basket and removed separately. The stones were sent to [facility] for chemical analysis. The ureter scope was reinserted up the ureter and then withdrawn and there was no injury to the mucosa noted therefore the safety wire was removed and I elected not to place a stent.  Attention was turned to the left  ureter orifice. It was difficult to cannulate with a sensor tip wire, but this was eventually able to be accomplished it was placed in the left renal pelvis on fluoroscopy. Semirigid ureter scope was then placed in the  distal ureter and the stone was immediately visualized. The stone was right at the opening of the ureter causing massive edema. I noted that the stone was embedded in the mucosa of the ureter. I placed the obturator to the 12-14 [place] ureter access sheath into the distal ureter on fluoroscopy to create a little more working room for the semirigid scope because the distal ureter was so tight. I then used a 200  holmium laser filament and carried out lithotripsy at 0.5 J and 20 Hz. The stone fragments were removed with a 0 tip Nitinol basket. I then navigated the semirigid scope into the proximal ureter and no more stones were visualized. I then withdrew the ureter scope back into the left distal ureter where the edema and mucosal disruption from the embedded stone was noted. I felt that it would be best to leave a ureter stent in place. Therefore the safety wire was loaded through a cystoscope and a 6 x 24 double-J ureter stent was placed over the wire with a good curl deployed in the left renal pelvis and a curl in the bladder. No tethering string was left in place. Stone fragments were then drained from the bladder, I placed 10 cc of lidocaine jelly into the urethra, and then I placed a belladonna and opium suppository to rectum. This completed the procedure. She's placed back in supine position, anesthesia was reversed, she was taken to the [facility] in stable condition.  All counts were correct at the end of the case.  I plan on keeping the ureter stent in place for proximally 4 weeks and then removing it to allow for good reepithelialization of this area in her left distal ureter from the stone.

## 2013-03-19 NOTE — H&P (Signed)
Urology History and Physical Exam  CC: Bilateral ureter stones.  HPI:  52 year old female presents today for bilateral ureter stents.  She began having symptoms for weeks ago.  This is associated with bilateral flank pain.  It is improved with narcotics.  She had a CT scan on 03/08/13.  This revealed a right proximal ureter stone which is 3 mm in size.  There is no associated hydronephrosis.  It also revealed 2 distal left ureter stones.  One was 3 mm and the other was at the left ureterovesical junction 5 mm.  These were associated with left hydroureter.  Urine culture was negative for growth on 03/08/13 and on 03/12/13.  We reviewed management options along with risks, benefits, and alternatives.  She presents today for cystoscopy, bilateral retrograde pyelograms, possible bilateral ureteroscopy, laser lithotripsy, possible bilateral ureter stent placement.  Since our visit in the office on 03/12/13 she has not passed any stones.  PMH: Past Medical History  Diagnosis Date  . PONV (postoperative nausea and vomiting)   . Cancer 2001    melanoma  . 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 Kidney stones   . Dysrhythmia     irregular heart beat when excited   . Asthma     as a child   . Headache(784.0)   . Arthritis     back     PSH: Past Surgical History  Procedure Laterality Date  . Breast surgery  2005- x 2 times    breast biopsies-bilateral  . Diagnostic laparoscopy  2010  . Kidney surgery  at age 34    tubes from kidneys stretched to bladder-1966  . Melanoma excision with sentinel lymph node biopsy  2001    mohr's procedure  . Lumbar laminectomy/decompression microdiscectomy  05/18/2011    Procedure: LUMBAR LAMINECTOMY/DECOMPRESSION MICRODISCECTOMY;  Surgeon: Javier Docker;  Location: [facility];  Service: Orthopedics;  Laterality: Right;  Decompression Lumbar 4-Lumbar 5  Right    (xray)   . Parotid gland cancer surgery      also had  radiation    Allergies: Allergies  Allergen Reactions  . Cephalexin Shortness Of Breath and Rash    Medications: No prescriptions prior to admission     Social History: History   Social History  . Marital Status: Married    Spouse Name: N/A    Number of Children: N/A  . Years of Education: N/A   Occupational History  . Not on file.   Social History Main Topics  . Smoking status: Never Smoker   . Smokeless tobacco: Never Used  . Alcohol Use: No  . Drug Use: No  . Sexual Activity: Not on file   Other Topics Concern  . Not on file   Social History Narrative  . No narrative on file    Family History: Family History  Problem Relation Age of Onset  . Hypertension Mother   . COPD Mother   . Cancer Mother     breast  . Dementia Mother   . Heart disease Father   . Cancer Father     Colorectal  . Hyperlipidemia Father   . Hypertension Father     Review of Systems: Positive: Right flank pain. Negative: Fever, nausea, chest pain, or SOB.  A further 10 point review of systems was negative except what is listed in the HPI.  Physical Exam: Filed Vitals:   03/19/13 0846  BP: 125/76  Pulse: 82  Temp: 97.5 F (36.4 C)  Resp: 16    General: No acute distress.  Awake. Head:  Normocephalic.  Atraumatic. ENT:  EOMI.  Mucous membranes moist Neck:  Supple.  No lymphadenopathy. CV:  S1 present. S2 present. Regular rate. Pulmonary: Equal effort bilaterally.  Clear to auscultation bilaterally. Abdomen: Soft.  Non- tender to palpation. Skin:  Normal turgor.  No visible rash. Extremity: No gross deformity of bilateral upper extremities.  No gross deformity of    bilateral lower extremities. Neurologic: Alert. Appropriate mood.  .  Studies:  No results found for this basename: HGB, WBC, PLT,  in the last 72 hours  No results found for this basename: NA, K, CL, CO2, BUN, CREATININE, CALCIUM, MAGNESIUM, GFRNONAA, GFRAA,  in the last 72 hours   No results found  for this basename: PT, INR, APTT,  in the last 72 hours   No components found with this basename: ABG,     Assessment:  Bilateral ureter stones.  Plan: To OR  for cystoscopy, bilateral retrograde pyelograms, possible bilateral ureteroscopy, laser lithotripsy, possible bilateral ureter stent placement.

## 2013-03-19 NOTE — Progress Notes (Signed)
Patient urinated bloody urine prior to discharge. Patient has no complaints. Patient is stable at discharge.

## 2013-03-19 NOTE — Progress Notes (Signed)
Phase 2 Short Stay/ Ambulated to BR with assist but unable to void

## 2013-03-19 NOTE — Preoperative (Signed)
Beta Blockers   Reason not to administer Beta Blockers:Not Applicable 

## 2013-03-19 NOTE — Progress Notes (Signed)
In Phase 2 of recover Short Stay. Dr Margarita Grizzle visited patient and husband and clarified some questions about prescriptions for home medications. Pt verbalized understanding.

## 2013-03-19 NOTE — OR Nursing (Signed)
Bilateral ureteral stones sent with Dr. Margarita Grizzle.

## 2013-03-19 NOTE — Progress Notes (Signed)
Phase 2 Short Stay Up to BR with assist and still unable to void.assisted back to bed. States pain is now 5/10. Bladder scan shows 399 cc. Pt medicated for pain and drinking cola  And eating graham crackers

## 2013-03-19 NOTE — Transfer of Care (Signed)
Immediate Anesthesia Transfer of Care Note  Patient: [name]  Procedure(s) Performed: Procedure(s): CYSTOSCOPY WITH RETROGRADE PYELOGRAM, BILATERAL URETEROSCOPY AND STENT PLACEMENT LEFT URETER,BILATERAL STONE EXTRACTION , HOLMIUM LASER LEFT URETER (Bilateral)  Patient Location: PACU  Anesthesia Type:General  Level of Consciousness: awake, sedated and patient cooperative  Airway & Oxygen Therapy: Patient Spontanous Breathing and Patient connected to face mask oxygen  Post-op Assessment: Report given to PACU RN and Post -op Vital signs reviewed and stable  Post vital signs: Reviewed and stable  Complications: No apparent anesthesia complications

## 2013-03-19 NOTE — Progress Notes (Addendum)
[facility] [address] [id number] [place] [address] Phone: [phone] Fax: [phone]  March 19, 2013  Patient: [name]  Date of Birth: [birth year]  Date of Visit: 03/19/13    To Whom It May Concern:  [name] was seen and treated in our Short Stay Department on 03/20/2013. [name]   Sincerely,

## 2013-03-20 ENCOUNTER — Encounter (HOSPITAL_COMMUNITY): Payer: Self-pay | Admitting: Urology

## 2013-03-20 NOTE — Anesthesia Postprocedure Evaluation (Addendum)
Anesthesia Post Note  Patient: [name]  Procedure(s) Performed: Procedure(s) (LRB): CYSTOSCOPY WITH RETROGRADE PYELOGRAM, BILATERAL URETEROSCOPY AND STENT PLACEMENT LEFT URETER,BILATERAL STONE EXTRACTION , HOLMIUM LASER LEFT URETER (Bilateral)  Anesthesia type: General  Patient location: PACU  Post pain: Pain level controlled  Post assessment: Post-op Vital signs reviewed  Last Vitals: BP 135/87  Pulse 80  Temp(Src) 36.2 C (Oral)  Resp 20  Ht 5\' 4"  (1.626 m)  Wt 156 lb 2 oz (70.818 kg)  BMI 26.79 kg/m2  SpO2 99%  LMP 05/18/2011  Post vital signs: Reviewed  Level of consciousness: sedated  Complications: No apparent anesthesia complications

## 2013-03-25 ENCOUNTER — Encounter (HOSPITAL_BASED_OUTPATIENT_CLINIC_OR_DEPARTMENT_OTHER): Payer: Self-pay | Admitting: *Deleted

## 2013-03-25 ENCOUNTER — Other Ambulatory Visit: Payer: Self-pay | Admitting: Urology

## 2013-03-25 NOTE — Progress Notes (Signed)
NPO AFTER MN. ARRIVES AT 1100. CURRENT CBC AND EKG IN EPIC AND CHART. MAY TAKE PERCOCET/ ZOFRAN IF NEEDED AM DOS W/ SIPS OF WATER.  PT STATES STENT PLACED 03-19-2013  FELL OUT  03-24-2013 PM.

## 2013-03-26 ENCOUNTER — Ambulatory Visit (HOSPITAL_BASED_OUTPATIENT_CLINIC_OR_DEPARTMENT_OTHER): Payer: BC Managed Care – PPO | Admitting: Certified Registered"

## 2013-03-26 ENCOUNTER — Encounter (HOSPITAL_BASED_OUTPATIENT_CLINIC_OR_DEPARTMENT_OTHER): Payer: BC Managed Care – PPO | Admitting: Certified Registered"

## 2013-03-26 ENCOUNTER — Ambulatory Visit (HOSPITAL_BASED_OUTPATIENT_CLINIC_OR_DEPARTMENT_OTHER)
Admission: RE | Admit: 2013-03-26 | Discharge: 2013-03-26 | Disposition: A | Discharge status: Home / Self Care | Payer: BC Managed Care – PPO | Source: Ambulatory Visit | Attending: Urology | Admitting: Urology

## 2013-03-26 ENCOUNTER — Ambulatory Visit (HOSPITAL_COMMUNITY): Payer: BC Managed Care – PPO

## 2013-03-26 ENCOUNTER — Encounter (HOSPITAL_BASED_OUTPATIENT_CLINIC_OR_DEPARTMENT_OTHER)
Admission: RE | Disposition: A | Discharge status: Home / Self Care | Payer: Self-pay | Source: Ambulatory Visit | Attending: Urology

## 2013-03-26 ENCOUNTER — Encounter (HOSPITAL_BASED_OUTPATIENT_CLINIC_OR_DEPARTMENT_OTHER): Payer: Self-pay | Admitting: *Deleted

## 2013-03-26 DIAGNOSIS — N1330 Unspecified hydronephrosis: Secondary | ICD-10-CM

## 2013-03-26 DIAGNOSIS — K219 Gastro-esophageal reflux disease without esophagitis: Secondary | ICD-10-CM | POA: Insufficient documentation

## 2013-03-26 DIAGNOSIS — N201 Calculus of ureter: Secondary | ICD-10-CM | POA: Insufficient documentation

## 2013-03-26 DIAGNOSIS — Z85820 Personal history of malignant melanoma of skin: Secondary | ICD-10-CM | POA: Insufficient documentation

## 2013-03-26 DIAGNOSIS — N135 Crossing vessel and stricture of ureter without hydronephrosis: Secondary | ICD-10-CM | POA: Insufficient documentation

## 2013-03-26 DIAGNOSIS — I10 Essential (primary) hypertension: Secondary | ICD-10-CM | POA: Insufficient documentation

## 2013-03-26 HISTORY — DX: Personal history of other specified conditions: Z87.898

## 2013-03-26 HISTORY — DX: Personal history of urinary calculi: Z87.442

## 2013-03-26 HISTORY — PX: CYSTOSCOPY WITH STENT PLACEMENT: SHX5790

## 2013-03-26 HISTORY — PX: CYSTOSCOPY W/ URETERAL STENT PLACEMENT: SHX1429

## 2013-03-26 HISTORY — DX: Calculus of ureter: N20.1

## 2013-03-26 SURGERY — CYSTOSCOPY, WITH RETROGRADE PYELOGRAM AND URETERAL STENT INSERTION
Anesthesia: General | Site: Ureter | Laterality: Left | Wound class: Clean Contaminated

## 2013-03-26 MED ORDER — BELLADONNA ALKALOIDS-OPIUM 16.2-60 MG RE SUPP
RECTAL | Status: DC | PRN
  Administered 2013-03-26: 1 via RECTAL

## 2013-03-26 MED ORDER — HYDROMORPHONE HCL PF 1 MG/ML IJ SOLN
0.2500 mg | INTRAMUSCULAR | Status: DC | PRN
  Filled 2013-03-26: qty 1

## 2013-03-26 MED ORDER — PROPOFOL 10 MG/ML IV BOLUS
INTRAVENOUS | Status: DC | PRN
  Administered 2013-03-26: 150 mg via INTRAVENOUS

## 2013-03-26 MED ORDER — LIDOCAINE HCL 2 % EX GEL
CUTANEOUS | Status: DC | PRN
  Administered 2013-03-26: 1 via URETHRAL

## 2013-03-26 MED ORDER — DEXAMETHASONE SODIUM PHOSPHATE 4 MG/ML IJ SOLN
INTRAMUSCULAR | Status: DC | PRN
  Administered 2013-03-26: 8 mg via INTRAVENOUS

## 2013-03-26 MED ORDER — MIDAZOLAM HCL 5 MG/5ML IJ SOLN
INTRAMUSCULAR | Status: DC | PRN
  Administered 2013-03-26: 1 mg via INTRAVENOUS

## 2013-03-26 MED ORDER — IOHEXOL 350 MG/ML SOLN
INTRAVENOUS | Status: DC | PRN
  Administered 2013-03-26: 15 mL

## 2013-03-26 MED ORDER — MEPERIDINE HCL 25 MG/ML IJ SOLN
6.2500 mg | INTRAMUSCULAR | Status: DC | PRN
  Filled 2013-03-26: qty 1

## 2013-03-26 MED ORDER — PROMETHAZINE HCL 25 MG/ML IJ SOLN
6.2500 mg | INTRAMUSCULAR | Status: DC | PRN
  Filled 2013-03-26: qty 1

## 2013-03-26 MED ORDER — LIDOCAINE HCL (CARDIAC) 20 MG/ML IV SOLN
INTRAVENOUS | Status: DC | PRN
  Administered 2013-03-26: 50 mg via INTRAVENOUS

## 2013-03-26 MED ORDER — OXYCODONE HCL 5 MG PO TABS
5.0000 mg | ORAL_TABLET | Freq: Once | ORAL | Status: DC | PRN
  Filled 2013-03-26: qty 1

## 2013-03-26 MED ORDER — OXYCODONE HCL 5 MG/5ML PO SOLN
5.0000 mg | Freq: Once | ORAL | Status: DC | PRN
  Filled 2013-03-26: qty 5

## 2013-03-26 MED ORDER — LACTATED RINGERS IV SOLN
INTRAVENOUS | Status: DC
  Administered 2013-03-26 (×2): via INTRAVENOUS
  Filled 2013-03-26: qty 1000

## 2013-03-26 MED ORDER — OXYCODONE-ACETAMINOPHEN 5-325 MG PO TABS
1.0000 | ORAL_TABLET | ORAL | Status: DC | PRN
  Administered 2013-03-26: 1 via ORAL
  Filled 2013-03-26: qty 2

## 2013-03-26 MED ORDER — FENTANYL CITRATE 0.05 MG/ML IJ SOLN
INTRAMUSCULAR | Status: DC | PRN
  Administered 2013-03-26: 50 ug via INTRAVENOUS

## 2013-03-26 MED ORDER — SODIUM CHLORIDE 0.9 % IR SOLN
Status: DC | PRN
  Administered 2013-03-26: 3000 mL

## 2013-03-26 MED ORDER — CIPROFLOXACIN IN D5W 400 MG/200ML IV SOLN
400.0000 mg | INTRAVENOUS | Status: AC
  Administered 2013-03-26: 400 mg via INTRAVENOUS
  Filled 2013-03-26: qty 200

## 2013-03-26 SURGICAL SUPPLY — 39 items
ADAPTER CATH URET PLST 4-6FR (CATHETERS) IMPLANT
ADPR CATH URET STRL DISP 4-6FR (CATHETERS)
BAG DRAIN URO-CYSTO SKYTR STRL (DRAIN) ×3 IMPLANT
BAG DRN UROCATH (DRAIN) ×1
BASKET LASER NITINOL 1.9FR (BASKET) IMPLANT
BASKET STNLS GEMINI 4WIRE 3FR (BASKET) IMPLANT
BASKET ZERO TIP NITINOL 2.4FR (BASKET) IMPLANT
BRUSH URET BIOPSY 3F (UROLOGICAL SUPPLIES) IMPLANT
BSKT STON RTRVL ZERO TP 2.4FR (BASKET)
CANISTER SUCT LVC 12 LTR MEDI- (MISCELLANEOUS) IMPLANT
CATH INTERMIT  6FR 70CM (CATHETERS) IMPLANT
CATH URET 5FR 28IN CONE TIP (BALLOONS)
CATH URET 5FR 28IN OPEN ENDED (CATHETERS) IMPLANT
CATH URET 5FR 70CM CONE TIP (BALLOONS) IMPLANT
CLOTH BEACON ORANGE TIMEOUT ST (SAFETY) ×3 IMPLANT
DRAPE CAMERA CLOSED 9X96 (DRAPES) ×3 IMPLANT
ELECT REM PT RETURN 9FT ADLT (ELECTROSURGICAL)
ELECTRODE REM PT RTRN 9FT ADLT (ELECTROSURGICAL) IMPLANT
GLOVE BIO SURGEON STRL SZ7 (GLOVE) ×3 IMPLANT
GLOVE BIOGEL PI IND STRL 7.5 (GLOVE) ×4 IMPLANT
GLOVE BIOGEL PI INDICATOR 7.5 (GLOVE) ×2
GLOVE INDICATOR 7.5 STRL GRN (GLOVE) IMPLANT
GOWN PREVENTION PLUS LG XLONG (DISPOSABLE) ×3 IMPLANT
GOWN STRL REIN XL XLG (GOWN DISPOSABLE) ×3 IMPLANT
GUIDEWIRE 0.038 PTFE COATED (WIRE) IMPLANT
GUIDEWIRE ANG ZIPWIRE 038X150 (WIRE) ×3 IMPLANT
GUIDEWIRE STR DUAL SENSOR (WIRE) IMPLANT
IV NS IRRIG 3000ML ARTHROMATIC (IV SOLUTION) ×3 IMPLANT
KIT BALLIN UROMAX 15FX10 (LABEL) IMPLANT
KIT BALLN UROMAX 15FX4 (MISCELLANEOUS) IMPLANT
KIT BALLN UROMAX 26 75X4 (MISCELLANEOUS)
LASER FIBER DISP (UROLOGICAL SUPPLIES) IMPLANT
NS IRRIG 500ML POUR BTL (IV SOLUTION) IMPLANT
PACK CYSTOSCOPY (CUSTOM PROCEDURE TRAY) ×3 IMPLANT
SET HIGH PRES BAL DIL (LABEL)
SHEATH ACCESS URETERAL 38CM (SHEATH) IMPLANT
SHEATH ACCESS URETERAL 54CM (SHEATH) IMPLANT
STENT POLARIS 5FRX24X.038 (STENTS) ×3 IMPLANT
SYRINGE IRR TOOMEY STRL 70CC (SYRINGE) IMPLANT

## 2013-03-26 NOTE — H&P (Signed)
Urology History and Physical Exam  CC: Left ureter stone.  HPI:  52 year old female presents today for left ureter stent placement, cystoscopy, and left retrograde pyelogram. She underwent bilateral ureteroscopy on 03/19/13 for bilateral ureter stents. At the time of her left ureteroscopy it was noted that her left distal ureter stone had been embedded in the wall of the ureter and disrupted the mucosa. Because of this I placed a 6/24 double-J ureter stent and plantarly this for approximately 4 weeks to allow healing and reepithelialization. The patient did have some stent discomfort and on 03/24/13 she noted the tip of the stent emanating from her urethra. I advised that she remove the stent. She did this with ease. This would not associated with increased left flank pain or fever. She did take a dose of ciprofloxacin to cover for manipulation. We discussed replacing the stent as I feel that this would give her the best chance of having good healing of her left distal ureter. We discussed the risk and benefits, alternatives, and likelihood of achieving goals.  PMH: Past Medical History  Diagnosis Date  . PONV (postoperative nausea and vomiting)   . Headache(784.0)   . Arthritis     back   . Left ureteral calculus   . History of kidney stones   . History of melanoma excision     2001--  RIGHT SUPRAORBITAL (RIGHT FOREHEAD AND UPPER EYELID ----  S/P MOHS PROCDURE W/ SLN BX----   NO RECURRENCE  . History of parotidectomy     09-12-2006---   MYOEPITHIOMA  OF PAROTID SALVERY GLAND --  X35  RADIATION TX  COMPLETED AUGUST 2008--   NO RECURRENCE  . History of palpitations     STRESS INDUCED    PSH: Past Surgical History  Procedure Laterality Date  . Diagnostic laparoscopy  2010  . Kidney surgery  at age 89    BILATERAL URETER'S DILATATION-80  . Melanoma excision with sentinel lymph node biopsy  2001    moh's procedure/  RIGHT FOREHEAD AND UPPER EYELID  . Lumbar laminectomy/decompression  microdiscectomy  05/18/2011    Procedure: LUMBAR LAMINECTOMY/DECOMPRESSION MICRODISCECTOMY;  Surgeon: Javier Docker;  Location: [facility];  Service: Orthopedics;  Laterality: Right;  Decompression Lumbar 4-Lumbar 5  Right    (xray)   . Cystoscopy with retrograde pyelogram, ureteroscopy and stent placement Bilateral 03/19/2013    Procedure: CYSTOSCOPY WITH RETROGRADE PYELOGRAM, BILATERAL URETEROSCOPY AND STENT PLACEMENT LEFT URETER,BILATERAL STONE EXTRACTION , HOLMIUM LASER LEFT URETER;  Surgeon: Milford Cage, MD;  Location: [facility];  Service: Urology;  Laterality: Bilateral;  . Right lateral parotidectomy w/ nerve dissection / right modified radical neck dissection sparing scm eleventh nerve and internal jugular vein  09-12-2006  DR DWIGHT BATES  . Diagnostic laparoscopy  04-12-2009  . Cardiovascular stress test  03-21-2012  DR [facility]    NORMAL NUCLEAR STUDY/  NO ISCHEMIA/  EF 63%  . Cardiac catheterization  2006 (APPROX)  [place]    NORMAL  . Breast surgery  2005- x 2 times    benign breast biopsies-bilateral    Allergies: Allergies  Allergen Reactions  . Cephalexin Shortness Of Breath and Rash    Medications: Prescriptions prior to admission  Medication Sig Dispense Refill  . aspirin-acetaminophen-caffeine (EXCEDRIN MIGRAINE) 250-250-65 MG per tablet Take 1 tablet by mouth every 6 (six) hours as needed for pain.      . ciprofloxacin (CIPRO) 500 MG tablet Take 1 tablet (500 mg total) by mouth 2 (two) times daily.  Begin the day before your ureter stent will be removed.  6 tablet  0  . hyoscyamine (LEVSIN, ANASPAZ) 0.125 MG tablet Take 1 tablet (0.125 mg total) by mouth every 4 (four) hours as needed for cramping (bladder spasms).  40 tablet  4  . magnesium hydroxide (MILK OF MAGNESIA) 400 MG/5ML suspension Take by mouth daily. TAKES X4 TABLETS DAILY --  NOT THE SUSPENSION      . ondansetron (ZOFRAN ODT) 4 MG disintegrating tablet 4mg  ODT q6 hours prn nausea/vomit  12 tablet  0   . oxyCODONE-acetaminophen (PERCOCET/ROXICET) 5-325 MG per tablet Take 1-2 tablets by mouth every 4 (four) hours as needed for pain.  60 tablet  0  . phenazopyridine (PYRIDIUM) 100 MG tablet Take 1 tablet (100 mg total) by mouth every 8 (eight) hours as needed for pain (Burning urination.  Will turn urine and body fluids orange.).  30 tablet  5  . senna-docusate (SENOKOT S) 8.6-50 MG per tablet Take 1 tablet by mouth 2 (two) times daily.  60 tablet  0  . silodosin (RAPAFLO) 8 MG CAPS capsule Take 8 mg by mouth daily with supper.       Marland Kitchen oxybutynin (DITROPAN) 5 MG tablet Take 1 tablet (5 mg total) by mouth every 6 (six) hours as needed (Bladder spasms.).  40 tablet  4     Social History: History   Social History  . Marital Status: Married    Spouse Name: N/A    Number of Children: N/A  . Years of Education: N/A   Occupational History  . Not on file.   Social History Main Topics  . Smoking status: Never Smoker   . Smokeless tobacco: Never Used  . Alcohol Use: No  . Drug Use: No  . Sexual Activity: Not on file   Other Topics Concern  . Not on file   Social History Narrative  . No narrative on file    Family History: Family History  Problem Relation Age of Onset  . Hypertension Mother   . COPD Mother   . Cancer Mother     breast  . Dementia Mother   . Heart disease Father   . Cancer Father     Colorectal  . Hyperlipidemia Father   . Hypertension Father     Review of Systems: Positive: None. Negative: Fever, SOB, or chest pain.  A further 10 point review of systems was negative except what is listed in the HPI.  Physical Exam: Filed Vitals:   03/26/13 1056  BP: 118/68  Pulse: 72  Temp: 97.8 F (36.6 C)  Resp: 16    General: No acute distress.  Awake. Head:  Normocephalic.  Atraumatic. ENT:  EOMI.  Mucous membranes moist Neck:  Supple.  No lymphadenopathy. CV:  S1 present. S2 present. Regular rate. Pulmonary: Equal effort bilaterally.  Clear to  auscultation bilaterally. Abdomen: Soft.  Non- tender to palpation. Skin:  Normal turgor.  No visible rash. Extremity: No gross deformity of bilateral upper extremities.  No gross deformity of    bilateral lower extremities. Neurologic: Alert. Appropriate mood.    Studies:  No results found for this basename: HGB, WBC, PLT,  in the last 72 hours  No results found for this basename: NA, K, CL, CO2, BUN, CREATININE, CALCIUM, MAGNESIUM, GFRNONAA, GFRAA,  in the last 72 hours   No results found for this basename: PT, INR, APTT,  in the last 72 hours   No components found with this basename: ABG,  Assessment:  Left distal ureter stone.  Plan: Proceed to the operating room for cystoscopy, left retrograde pyelogram, and left ureter stent placement.

## 2013-03-26 NOTE — Anesthesia Preprocedure Evaluation (Addendum)
Anesthesia Evaluation  Patient identified by MRN, date of birth, ID band Patient awake    Reviewed: Allergy & Precautions, H&P , NPO status , Patient's Chart, lab work & pertinent test results  History of Anesthesia Complications (+) PONV and Family history of anesthesia reaction  Airway Mallampati: II TM Distance: >3 FB Neck ROM: Full    Dental no notable dental hx. (+) Dental Advisory Given   Pulmonary asthma ,  breath sounds clear to auscultation  Pulmonary exam normal       Cardiovascular hypertension, Pt. on medications Rhythm:Regular Rate:Normal     Neuro/Psych  Headaches, negative psych ROS   GI/Hepatic Neg liver ROS, GERD-  ,  Endo/Other  negative endocrine ROS  Renal/GU Renal disease     Musculoskeletal negative musculoskeletal ROS (+)   Abdominal   Peds  Hematology negative hematology ROS (+)   Anesthesia Other Findings   Reproductive/Obstetrics negative OB ROS                           Anesthesia Physical  Anesthesia Plan  ASA: II  Anesthesia Plan: General   Post-op Pain Management:    Induction: Intravenous  Airway Management Planned: LMA  Additional Equipment:   Intra-op Plan:   Post-operative Plan: Extubation in OR  Informed Consent: I have reviewed the patients History and Physical, chart, labs and discussed the procedure including the risks, benefits and alternatives for the proposed anesthesia with the patient or authorized representative who has indicated his/her understanding and acceptance.   Dental advisory given  Plan Discussed with: CRNA  Anesthesia Plan Comments: (H/o melanoma and parotid gland tumor. Plan multiple antiemetics. Possible glidescope due to hx of parotidectomy and XRT. )        Anesthesia Quick Evaluation

## 2013-03-26 NOTE — Op Note (Signed)
Urology Operative Report  Date of Procedure: 03/26/13  Surgeon: Natalia Leatherwood, MD Assistant:  None  Preoperative Diagnosis: Left ureter calculus. Postoperative Diagnosis: Left ureter calculus. Left hydronephrosis.  Procedure(s): Left ureter stent placement. Left retrograde pyelogram with interpretation. Cystoscopy.  Estimated blood loss: None  Specimen: None  Drains: None  Complications: None  Findings: Left hydronephrosis with left hydroureter. Negative filling defects on retrograde pyelogram. 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 Possible UPJ obstruction versus edema from the stent.  History of present illness: Patient presents for left retrograde pyelogram and left ureter stent placement. She had a left ureter stone which required ureteroscopy. The stone was embedded in the wall of the ureter and a stent was placed to help this area heal after ureteroscopy. She passed the stent several days after the ureteroscopy and it was felt that she needed to have replacement of the stent. She presents today for that procedure.   Procedure in detail: After informed consent was obtained, the patient was taken to the operating room. They were placed in the supine position. SCDs were turned on and in place. IV antibiotics were infused, and general anesthesia was induced. A timeout was performed in which the correct patient, surgical site, and procedure were identified and agreed upon by the team.  The patient was placed in a dorsolithotomy position, making sure to pad all pertinent neurovascular pressure points. A belladonna and opium suppository was placed into the rectum. The genitals were prepped and draped in the usual sterile fashion.  The cystoscope was advanced through the urethra and into the bladder. The bladder was drained and then attention was turned to the left ureter orifice. It was cannulated with a 5 [place] ureter catheter and I injected  contrast. There was noted to be stenosis at the distal ureter for approximately 1 cm and then hydroureter up to the ureteropelvic junction. At this area it narrowed and almost looked like a pigtail going into the left renal pelvis. I advanced an angle-tipped Glidewire through the ureter catheter and into the left renal pelvis. I then advanced a 5 [place] ureter catheter into the mid ureter and removed the wire. I again injected contrast to obtain a further retrograde pyelogram. This confirmed narrowing at the ureteropelvic junction on the left side. This could represent edema from a recently passed stent versus a congenital ureteropelvic junction obstruction. I then advanced the angle-tipped Glidewire through the 5 [place] ureter catheter and into the left renal pelvis. The ureter catheter was removed and a 5 x 24 Pol[place] stent was placed without the tethering strings over the Glidewire and into the left renal pelvis. A good curl was noted in the left renal pelvis on fluoroscopy and a stent was deployed into the bladder. The bladder was then drained and I placed 10 cc of lidocaine jelly into the urethra.  She was placed in a supine position, anesthesia was reversed, she was taken to the [facility] in stable condition.  All counts were correct at the end of the case.

## 2013-03-26 NOTE — Anesthesia Postprocedure Evaluation (Signed)
Anesthesia Post Note  Patient: [name]  Procedure(s) Performed: Procedure(s) (LRB): CYSTOSCOPY WITH RETROGRADE PYELOGRAM  (Left) CYSTOSCOPY WITH STENT PLACEMENT (Left)  Anesthesia type: General  Patient location: PACU  Post pain: Pain level controlled  Post assessment: Post-op Vital signs reviewed  Last Vitals: BP 121/62  Pulse 68  Temp(Src) 36.2 C (Oral)  Resp 16  Wt 153 lb (69.4 kg)  BMI 26.25 kg/m2  SpO2 96%  LMP 05/18/2011  Post vital signs: Reviewed  Level of consciousness: sedated  Complications: No apparent anesthesia complications

## 2013-03-26 NOTE — Anesthesia Procedure Notes (Signed)
Procedure Name: LMA Insertion Performed by: Theophile Harvie, [place] Pre-anesthesia Checklist: Patient identified, Emergency Drugs available, Suction available and Patient being monitored Patient Re-evaluated:Patient Re-evaluated prior to inductionOxygen Delivery Method: Circle System Utilized Preoxygenation: Pre-oxygenation with 100% oxygen Intubation Type: IV induction Ventilation: Mask ventilation without difficulty LMA: LMA inserted LMA Size: 3.0 Number of attempts: 1 Airway Equipment and Method: bite block Placement Confirmation: positive ETCO2 Tube secured with: Tape Dental Injury: Teeth and Oropharynx as per pre-operative assessment      

## 2013-03-26 NOTE — Transfer of Care (Signed)
Immediate Anesthesia Transfer of Care Note  Patient: [name]  Procedure(s) Performed: Procedure(s): CYSTOSCOPY WITH RETROGRADE PYELOGRAM  (Left) CYSTOSCOPY WITH STENT PLACEMENT (Left)  Patient Location: PACU  Anesthesia Type:General  Level of Consciousness: awake  Airway & Oxygen Therapy: Patient Spontanous Breathing and Patient connected to face mask oxygen  Post-op Assessment: Report given to PACU RN and Post -op Vital signs reviewed and stable  Post vital signs: Reviewed and stable  Complications: No apparent anesthesia complications

## 2013-03-26 NOTE — OR Nursing (Signed)
PATIENT HAD NO STENT IN UPON ARRIVAL BUT STILL LISTED IN EPIC

## 2013-03-27 ENCOUNTER — Encounter (HOSPITAL_BASED_OUTPATIENT_CLINIC_OR_DEPARTMENT_OTHER): Payer: Self-pay | Admitting: Urology

## 2013-04-24 ENCOUNTER — Other Ambulatory Visit (HOSPITAL_COMMUNITY): Payer: Self-pay | Admitting: Urology

## 2013-04-24 DIAGNOSIS — N135 Crossing vessel and stricture of ureter without hydronephrosis: Secondary | ICD-10-CM

## 2013-04-29 ENCOUNTER — Other Ambulatory Visit: Payer: Self-pay

## 2013-04-29 DIAGNOSIS — Z1231 Encounter for screening mammogram for malignant neoplasm of breast: Secondary | ICD-10-CM

## 2013-06-05 ENCOUNTER — Encounter (HOSPITAL_COMMUNITY)
Admission: RE | Admit: 2013-06-05 | Discharge: 2013-06-05 | Disposition: A | Discharge status: Home / Self Care | Payer: BC Managed Care – PPO | Source: Ambulatory Visit | Attending: Urology | Admitting: Urology

## 2013-06-05 DIAGNOSIS — N2889 Other specified disorders of kidney and ureter: Secondary | ICD-10-CM | POA: Insufficient documentation

## 2013-06-05 DIAGNOSIS — N135 Crossing vessel and stricture of ureter without hydronephrosis: Secondary | ICD-10-CM

## 2013-06-05 MED ORDER — FUROSEMIDE 10 MG/ML IJ SOLN
40.0000 mg | Freq: Once | INTRAMUSCULAR | Status: AC
  Administered 2013-06-05: 40 mg via INTRAVENOUS
  Filled 2013-06-05: qty 4

## 2013-06-05 MED ORDER — TECHNETIUM TC 99M MERTIATIDE
15.5000 | Freq: Once | INTRAVENOUS | Status: AC | PRN
  Administered 2013-06-05: 16 via INTRAVENOUS

## 2013-06-09 ENCOUNTER — Telehealth: Payer: Self-pay | Admitting: General Practice

## 2013-06-09 ENCOUNTER — Encounter: Payer: Self-pay | Admitting: General Practice

## 2013-06-09 ENCOUNTER — Ambulatory Visit (INDEPENDENT_AMBULATORY_CARE_PROVIDER_SITE_OTHER): Payer: BC Managed Care – PPO | Admitting: General Practice

## 2013-06-09 ENCOUNTER — Ambulatory Visit: Payer: BC Managed Care – PPO | Admitting: Family Medicine

## 2013-06-09 VITALS — BP 153/80 | HR 75 | Temp 98.2°F | Ht 63.0 in | Wt 158.5 lb

## 2013-06-09 DIAGNOSIS — R51 Headache: Secondary | ICD-10-CM

## 2013-06-09 DIAGNOSIS — Z85828 Personal history of other malignant neoplasm of skin: Secondary | ICD-10-CM

## 2013-06-09 DIAGNOSIS — R7989 Other specified abnormal findings of blood chemistry: Secondary | ICD-10-CM

## 2013-06-09 DIAGNOSIS — R42 Dizziness and giddiness: Secondary | ICD-10-CM

## 2013-06-09 MED ORDER — KETOROLAC TROMETHAMINE 60 MG/2ML IM SOLN
60.0000 mg | Freq: Once | INTRAMUSCULAR | Status: AC
  Administered 2013-06-09: 60 mg via INTRAMUSCULAR

## 2013-06-09 NOTE — Progress Notes (Signed)
   Subjective:    Patient ID: [name], female    DOB: [birth year], 52 y.o.   MRN: [id number]  Headache  This is a recurrent problem. The current episode started more than 1 month ago. The problem occurs constantly. The problem has been gradually worsening. The pain is located in the parietal (right side) region. The pain does not radiate. The pain quality is similar to prior headaches. The quality of the pain is described as stabbing. The pain is at a severity of 10/10. Associated symptoms include dizziness and ear pain. Pertinent negatives include no abdominal pain, blurred vision, fever, hearing loss, nausea, neck pain, numbness, sinus pressure, sore throat, tingling or tinnitus. The symptoms are aggravated by noise. She has tried Excedrin for the symptoms. Her past medical history is significant for cancer and migraine headaches. There is no history of hypertension. 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    Review of Systems  Constitutional: Negative for fever and chills.  HENT: Positive for ear pain. Negative for hearing loss, postnasal drip, sinus pressure, sore throat, tinnitus and trouble swallowing.        Right ear pain  Eyes: Negative for blurred vision.  Respiratory: Negative for chest tightness and shortness of breath.   Cardiovascular: Negative for chest pain and palpitations.  Gastrointestinal: Negative for nausea and abdominal pain.  Musculoskeletal: Negative for neck pain.  Neurological: Positive for dizziness and headaches. Negative for tingling and numbness.       Objective:   Physical Exam  Constitutional: She is oriented to person, place, and time. She appears well-developed and well-nourished.  HENT:  Head: Normocephalic and atraumatic.  Right Ear: External ear normal.  Left Ear: External ear normal.  Eyes: EOM are normal. Pupils are equal, round,  and reactive to light.  Neck: Normal range of motion. Neck supple.  Cardiovascular: Normal rate, regular rhythm and normal heart sounds.   Pulmonary/Chest: Effort normal and breath sounds normal. No respiratory distress. She exhibits no tenderness.  Neurological: She is alert and oriented to person, place, and time.  Skin: Skin is warm and dry.  Psychiatric: She has a normal mood and affect.          Assessment & Plan:  1. Headache(784.0)  - POCT CBC - CMP14+EGFR - CBC with Differential - CT Head Wo Contrast; Future - ketorolac (TORADOL) injection 60 mg; Inject 2 mLs (60 mg total) into the muscle once.  2. Dizziness  - POCT CBC - CMP14+EGFR - CBC with Differential - CT Head Wo Contrast; Future -patient refused CT scan and verbalized she would follow up if symptoms worsened -informed patient of potential risks of not following up on current symptoms -patient verbalized she would follow up if symptoms worsened  3. Other abnormal blood chemistry   4. History of skin cancer  - Ambulatory referral to Oncology -Provider spoke with Dr. Alver Fisher office and patient would need referral, due to length of time since last visit -Patient verbalized understanding Coralie Keens, FNP-C

## 2013-06-10 LAB — CMP14+EGFR
AST: 19 IU/L (ref 0–40)
Albumin/Globulin Ratio: 2.3 (ref 1.1–2.5)
Albumin: 4.4 g/dL (ref 3.5–5.5)
Alkaline Phosphatase: 63 IU/L (ref 39–117)
BUN/Creatinine Ratio: 18 (ref 9–23)
BUN: 17 mg/dL (ref 6–24)
Calcium: 9.3 mg/dL (ref 8.7–10.2)
Creatinine, Ser: 0.94 mg/dL (ref 0.57–1.00)
GFR calc Af Amer: 80 mL/min/{1.73_m2} (ref >59)
GFR calc non Af Amer: 69 mL/min/{1.73_m2} (ref >59)
Globulin, Total: 1.9 g/dL (ref 1.5–4.5)
Glucose: 88 mg/dL (ref 65–99)
Sodium: 143 mmol/L (ref 134–144)
Total Bilirubin: <0.2 mg/dL (ref 0.0–1.2)

## 2013-06-10 LAB — CBC WITH DIFFERENTIAL/PLATELET
Basophils Absolute: 0 10*3/uL (ref 0.0–0.2)
Basos: 0 %
Eosinophils Absolute: 0.1 10*3/uL (ref 0.0–0.4)
Immature Grans (Abs): 0 10*3/uL (ref 0.0–0.1)
Immature Granulocytes: 0 %
Lymphs: 30 %
MCH: 31.8 pg (ref 26.6–33.0)
MCHC: 34.6 g/dL (ref 31.5–35.7)
MCV: 92 fL (ref 79–97)
Monocytes Absolute: 0.2 10*3/uL (ref 0.1–0.9)
Neutrophils Relative %: 62 %
RDW: 13.4 % (ref 12.3–15.4)

## 2013-06-15 ENCOUNTER — Emergency Department (HOSPITAL_COMMUNITY): Payer: BC Managed Care – PPO

## 2013-06-15 ENCOUNTER — Emergency Department (HOSPITAL_COMMUNITY)
Admission: EM | Admit: 2013-06-15 | Discharge: 2013-06-15 | Disposition: A | Discharge status: Home / Self Care | Payer: BC Managed Care – PPO | Attending: Emergency Medicine | Admitting: Emergency Medicine

## 2013-06-15 ENCOUNTER — Encounter (HOSPITAL_COMMUNITY): Payer: Self-pay | Admitting: Emergency Medicine

## 2013-06-15 DIAGNOSIS — Z95818 Presence of other cardiac implants and grafts: Secondary | ICD-10-CM | POA: Insufficient documentation

## 2013-06-15 DIAGNOSIS — R42 Dizziness and giddiness: Secondary | ICD-10-CM | POA: Insufficient documentation

## 2013-06-15 DIAGNOSIS — Z85820 Personal history of malignant melanoma of skin: Secondary | ICD-10-CM | POA: Insufficient documentation

## 2013-06-15 DIAGNOSIS — Z87442 Personal history of urinary calculi: Secondary | ICD-10-CM | POA: Insufficient documentation

## 2013-06-15 DIAGNOSIS — Z85858 Personal history of malignant neoplasm of other endocrine glands: Secondary | ICD-10-CM | POA: Insufficient documentation

## 2013-06-15 DIAGNOSIS — M479 Spondylosis, unspecified: Secondary | ICD-10-CM | POA: Insufficient documentation

## 2013-06-15 DIAGNOSIS — R51 Headache: Secondary | ICD-10-CM | POA: Insufficient documentation

## 2013-06-15 LAB — CBC WITH DIFFERENTIAL/PLATELET
Basophils Absolute: 0 10*3/uL (ref 0.0–0.1)
Basophils Relative: 0 % (ref 0–1)
Eosinophils Absolute: 0.1 10*3/uL (ref 0.0–0.7)
Eosinophils Relative: 2 % (ref 0–5)
HCT: 35.1 % — ABNORMAL LOW (ref 36.0–46.0)
Lymphocytes Relative: 28 % (ref 12–46)
MCHC: 34.8 g/dL (ref 30.0–36.0)
MCV: 91.9 fL (ref 78.0–100.0)
Monocytes Absolute: 0.3 10*3/uL (ref 0.1–1.0)
Neutro Abs: 2.4 10*3/uL (ref 1.7–7.7)
Platelets: 146 10*3/uL — ABNORMAL LOW (ref 150–400)
RDW: 12.4 % (ref 11.5–15.5)
WBC: 3.9 10*3/uL — ABNORMAL LOW (ref 4.0–10.5)

## 2013-06-15 LAB — COMPREHENSIVE METABOLIC PANEL
ALT: 13 U/L (ref 0–35)
AST: 17 U/L (ref 0–37)
Albumin: 3.8 g/dL (ref 3.5–5.2)
BUN: 18 mg/dL (ref 6–23)
CO2: 28 mEq/L (ref 19–32)
Calcium: 9.2 mg/dL (ref 8.4–10.5)
Creatinine, Ser: 0.68 mg/dL (ref 0.50–1.10)
Sodium: 139 mEq/L (ref 135–145)
Total Bilirubin: 0.2 mg/dL — ABNORMAL LOW (ref 0.3–1.2)
Total Protein: 6.7 g/dL (ref 6.0–8.3)

## 2013-06-15 MED ORDER — HYDROMORPHONE HCL PF 1 MG/ML IJ SOLN
1.0000 mg | Freq: Once | INTRAMUSCULAR | Status: AC
  Administered 2013-06-15: 1 mg via INTRAVENOUS
  Filled 2013-06-15: qty 1

## 2013-06-15 MED ORDER — AMOXICILLIN-POT CLAVULANATE 875-125 MG PO TABS
1.0000 | ORAL_TABLET | Freq: Once | ORAL | Status: AC
  Administered 2013-06-15: 1 via ORAL
  Filled 2013-06-15: qty 1

## 2013-06-15 MED ORDER — HYDROMORPHONE HCL 4 MG PO TABS: ORAL_TABLET | ORAL | Status: DC

## 2013-06-15 MED ORDER — ONDANSETRON HCL 4 MG/2ML IJ SOLN
4.0000 mg | Freq: Once | INTRAMUSCULAR | Status: AC
  Administered 2013-06-15: 4 mg via INTRAVENOUS
  Filled 2013-06-15: qty 2

## 2013-06-15 MED ORDER — AMOXICILLIN-POT CLAVULANATE 875-125 MG PO TABS: 1.0000 | ORAL_TABLET | Freq: Two times a day (BID) | ORAL | Status: DC

## 2013-06-15 NOTE — ED Notes (Signed)
Pt reports has been having frequent "blinding" headaches on r side of head for the past month. Says headaches are severe.   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  Reports has been cancer free since 2008.  Pt says has been taking excedrin and percocet prn.  Reports at times sees little pink spots and has had some dizzy spells.  Pt last took percocet today around 1130 am.

## 2013-06-15 NOTE — ED Notes (Signed)
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 Estell Harpin at bedside upon pt arrival to tx room, see edp assessment for further,

## 2013-06-15 NOTE — ED Provider Notes (Signed)
CSN: [id number]     Arrival date & time 06/15/13  1232 History   This chart was scribed for Tammy Lennert, MD, by Yevette Edwards, ED Scribe. This patient was seen in room APA16A/APA16A and the patient's care was started at 5:47 PM.  First MD Initiated Contact with Patient 06/15/13 1741     Chief Complaint  Patient presents with  . Headache   Patient is a 52 y.o. female presenting with headaches. The history is provided by the patient. No language interpreter was used.  Headache Pain location:  R temporal Radiates to:  Does not radiate Severity currently:  10/10 Duration:  4 weeks Timing:  Constant Progression:  Worsening Chronicity:  Recurrent Ineffective treatments:  Acetaminophen and prescription medications Associated symptoms: dizziness   Associated symptoms: no fever    HPI Comments: [name] is a 52 y.o. female, with a h/o headaches, who presents to the Emergency Department complaining of right-sided "severe" headaches which have occurred daily for the past month.  The pt characterizes the headaches as "throbbing" and "blinding," and she reports visual disturbances and dizziness associated with them. She has treated the headaches with Excedrin and percocet, but she reports the medication is no longer relieving the pain. She denies a fever or chills. 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  She is a non-smoker.   Dr. Jenne Pane in [place] performed her parotidectomy.  Past Medical History  Diagnosis Date  . PONV (postoperative nausea and vomiting)   . Headache(784.0)   . Arthritis     back   . Left ureteral calculus   . History of kidney stones   . History of melanoma excision     2001--  RIGHT SUPRAORBITAL (RIGHT FOREHEAD AND UPPER EYELID ----  S/P MOHS PROCDURE W/ SLN BX----   NO RECURRENCE  .  History of parotidectomy     09-12-2006---   MYOEPITHIOMA  OF PAROTID SALVERY GLAND --  X35  RADIATION TX  COMPLETED AUGUST 2008--   NO RECURRENCE  . History of palpitations     STRESS INDUCED   Past Surgical History  Procedure Laterality Date  . Diagnostic laparoscopy  2010  . Kidney surgery  at age 24    BILATERAL URETER'S DILATATION-26  . Melanoma excision with sentinel lymph node biopsy  2001    moh's procedure/  RIGHT FOREHEAD AND UPPER EYELID  . Lumbar laminectomy/decompression microdiscectomy  05/18/2011    Procedure: LUMBAR LAMINECTOMY/DECOMPRESSION MICRODISCECTOMY;  Surgeon: Javier Docker;  Location: [facility];  Service: Orthopedics;  Laterality: Right;  Decompression Lumbar 4-Lumbar 5  Right    (xray)   . Cystoscopy with retrograde pyelogram, ureteroscopy and stent placement Bilateral 03/19/2013    Procedure: CYSTOSCOPY WITH RETROGRADE PYELOGRAM, BILATERAL URETEROSCOPY AND STENT PLACEMENT LEFT URETER,BILATERAL STONE EXTRACTION , HOLMIUM LASER LEFT URETER;  Surgeon: Milford Cage, MD;  Location: [facility];  Service: Urology;  Laterality: Bilateral;  . Right lateral parotidectomy w/ nerve dissection / right modified radical neck dissection sparing scm eleventh nerve and internal jugular vein  09-12-2006  DR DWIGHT BATES  . Diagnostic laparoscopy  04-12-2009  . Cardiovascular stress test  03-21-2012  [facility]    NORMAL NUCLEAR STUDY/  NO ISCHEMIA/  EF 63%  . Cardiac catheterization  2006 (APPROX)  [place]    NORMAL  .  Breast surgery  2005- x 2 times    benign breast biopsies-bilateral  . Cystoscopy w/ ureteral stent placement Left 03/26/2013    Procedure: CYSTOSCOPY WITH RETROGRADE PYELOGRAM ;  Surgeon: Milford Cage, MD;  Location: [facility];  Service: Urology;  Laterality: Left;  . Cystoscopy with stent placement Left 03/26/2013    Procedure: CYSTOSCOPY WITH STENT PLACEMENT;  Surgeon: Milford Cage, MD;  Location: [facility];  Service: Urology;  Laterality: Left;   Family History  Problem Relation Age of Onset  . Hypertension Mother   . COPD Mother   . Cancer Mother     breast  . Dementia Mother   . Heart disease Father   . Cancer Father     Colorectal  . Hyperlipidemia Father   . Hypertension Father    History  Substance Use Topics  . Smoking status: Never Smoker   . Smokeless tobacco: Never Used  . Alcohol Use: No   No OB history provided.  Review of Systems  Constitutional: Negative for fever and chills.  Eyes: Positive for visual disturbance.  Neurological: Positive for dizziness and headaches.  All other systems reviewed and are negative.    Allergies  Cephalexin  Home Medications   Current Outpatient Rx  Name  Route  Sig  Dispense  Refill  . aspirin-acetaminophen-caffeine (EXCEDRIN MIGRAINE) 250-250-65 MG per tablet   Oral   Take 1 tablet by mouth every 6 (six) hours as needed for pain.         . magnesium hydroxide (MILK OF MAGNESIA) 400 MG/5ML suspension   Oral   Take by mouth daily. TAKES X4 TABLETS DAILY --  NOT THE SUSPENSION         . senna-docusate (SENOKOT S) 8.6-50 MG per tablet   Oral   Take 1 tablet by mouth 2 (two) times daily.   60 tablet   0    Triage Vitals: BP 156/81  Pulse 76  Temp(Src) 98.1 F (36.7 C) (Oral)  Resp 20  Ht 5\' 4"  (1.626 m)  Wt 155 lb (70.308 kg)  BMI 26.59 kg/m2  SpO2 100%  LMP 05/18/2011  Physical Exam  Constitutional: She is oriented to person, place, and time. She appears well-developed.  HENT:  Head: Normocephalic.  Eyes: Conjunctivae and EOM are normal. No scleral icterus.  Neck: Neck supple. No thyromegaly present.  Cardiovascular: Normal rate and regular rhythm.  Exam reveals no gallop and no friction rub.   No murmur heard. Pulmonary/Chest: No stridor. She has no wheezes. She has no rales. She exhibits no tenderness.  Abdominal: She exhibits no distension. There is no tenderness. There is no rebound.   Musculoskeletal: Normal range of motion. She exhibits no edema.  Lymphadenopathy:    She has no cervical adenopathy.  Neurological: She is oriented to person, place, and time. She exhibits normal muscle tone. Coordination normal.  Skin: No rash noted. No erythema.  Psychiatric: She has a normal mood and affect. Her behavior is normal.    ED Course  Procedures (including critical care time)  DIAGNOSTIC STUDIES: Oxygen Saturation is 100% on room air, normal by my interpretation.    COORDINATION OF CARE:  5:50 PM- Discussed treatment plan with patient, and the patient agreed to the plan.   Labs Review Labs Reviewed  CBC WITH DIFFERENTIAL - Abnormal; Notable for the following:    WBC 3.9 (*)    RBC 3.82 (*)    HCT 35.1 (*)  Platelets 146 (*)    All other components within normal limits  COMPREHENSIVE METABOLIC PANEL - Abnormal; Notable for the following:    Glucose, Bld 104 (*)    Total Bilirubin 0.2 (*)    All other components within normal limits   Imaging Review Ct Head Wo Contrast  06/15/2013   CLINICAL DATA:  Persistent right temporal headache, past history of frontal melanoma, parotid gland tumor with right cervical lymph node dissection and followup radiation therapy  EXAM: CT HEAD WITHOUT CONTRAST  TECHNIQUE: Contiguous axial images were obtained from the base of the skull through the vertex without intravenous contrast.  COMPARISON:  None  FINDINGS: Normal ventricular morphology.  No midline shift or mass effect.  Normal appearance of brain parenchyma.  No intracranial hemorrhage, mass lesion, or evidence acute infarction.  No extra-axial fluid collections.  Partial opacification of the right mastoid air cells.  Bones and sinuses otherwise unremarkable.  IMPRESSION: No acute intracranial abnormalities.  Partial opacification of the right ethmoid air cells, question right mastoid effusion versus mastoiditis.   Electronically Signed   By: Ulyses Southward M.D.   On: 06/15/2013  14:38    EKG Interpretation   None       MDM  Headache.   Possible mastoid disease.  Discussed with radiolgist.  Finding most likely old.  Will cover with augmentin and refer pt to her ENT this week.    Tammy Lennert, MD 06/15/13 [phone]

## 2013-06-17 ENCOUNTER — Ambulatory Visit
Admission: RE | Admit: 2013-06-17 | Discharge: 2013-06-17 | Disposition: A | Discharge status: Home / Self Care | Payer: BC Managed Care – PPO | Source: Ambulatory Visit

## 2013-06-17 ENCOUNTER — Other Ambulatory Visit: Payer: Self-pay | Admitting: Otolaryngology

## 2013-06-17 DIAGNOSIS — Z1231 Encounter for screening mammogram for malignant neoplasm of breast: Secondary | ICD-10-CM

## 2013-06-18 ENCOUNTER — Ambulatory Visit
Admission: RE | Admit: 2013-06-18 | Discharge: 2013-06-18 | Disposition: A | Discharge status: Home / Self Care | Payer: BC Managed Care – PPO | Source: Ambulatory Visit | Attending: Otolaryngology | Admitting: Otolaryngology

## 2013-06-18 MED ORDER — GADOBENATE DIMEGLUMINE 529 MG/ML IV SOLN
14.0000 mL | Freq: Once | INTRAVENOUS | Status: AC | PRN
  Administered 2013-06-18: 14 mL via INTRAVENOUS

## 2013-07-02 ENCOUNTER — Telehealth: Payer: Self-pay | Admitting: *Deleted

## 2013-07-03 ENCOUNTER — Ambulatory Visit (HOSPITAL_COMMUNITY): Payer: BC Managed Care – PPO

## 2013-07-04 NOTE — Progress Notes (Signed)
This encounter was created in error - please disregard.

## 2014-01-12 ENCOUNTER — Telehealth: Payer: Self-pay | Admitting: Cardiovascular Disease

## 2014-01-12 NOTE — Telephone Encounter (Signed)
Patient states that she is experiencing frequent discomfort when she swallows "chewy" food, such as pieces of meat.  She states when she swallows it, it feels like it gets "stuck" mid-chest and then she experiences a sharp pain to the center part of her chest that dissipates after a couple of minutes. This has been occurring more and more frequently over the past few weeks. She is asking if she should take this concern to a cardiologist or a GI specialist. Patient denies other symptomology. States after a couple of minutes, the pain subsides and she is fine until she eats again. Advised patient to contact GI specialist to schedule an appointment since it does not sound related to cardiac issues. Patient is to call back soon though to schedule her annual check up with Dr. Johnsie Cancel though. Also encouraged her to call us back should she have further or worsening problems that her GI specialist, Dr. Cristina Gong, felt were cardiac in nature or if she had any cardiac symptoms. Patient verbalized understanding and agreement with plan.

## 2014-01-12 NOTE — Telephone Encounter (Signed)
New message          Pt has chest discomfort when she eats something chewy

## 2014-01-15 ENCOUNTER — Other Ambulatory Visit: Payer: Self-pay | Admitting: Gastroenterology

## 2014-01-15 DIAGNOSIS — R1310 Dysphagia, unspecified: Secondary | ICD-10-CM

## 2014-01-22 ENCOUNTER — Other Ambulatory Visit: Payer: Self-pay | Admitting: Gastroenterology

## 2014-01-22 ENCOUNTER — Ambulatory Visit
Admission: RE | Admit: 2014-01-22 | Discharge: 2014-01-22 | Disposition: A | Discharge status: Home / Self Care | Payer: BC Managed Care – PPO | Source: Ambulatory Visit | Attending: Gastroenterology | Admitting: Gastroenterology

## 2014-01-22 DIAGNOSIS — R933 Abnormal findings on diagnostic imaging of other parts of digestive tract: Secondary | ICD-10-CM

## 2014-01-22 DIAGNOSIS — R1310 Dysphagia, unspecified: Secondary | ICD-10-CM

## 2014-01-23 ENCOUNTER — Ambulatory Visit
Admission: RE | Admit: 2014-01-23 | Discharge: 2014-01-23 | Disposition: A | Discharge status: Home / Self Care | Payer: BC Managed Care – PPO | Source: Ambulatory Visit | Attending: Gastroenterology | Admitting: Gastroenterology

## 2014-01-23 DIAGNOSIS — R933 Abnormal findings on diagnostic imaging of other parts of digestive tract: Secondary | ICD-10-CM

## 2014-03-19 ENCOUNTER — Ambulatory Visit (INDEPENDENT_AMBULATORY_CARE_PROVIDER_SITE_OTHER): Payer: BC Managed Care – PPO | Admitting: Cardiovascular Disease

## 2014-03-19 VITALS — HR 72 | Ht 64.0 in | Wt 168.0 lb

## 2014-03-19 DIAGNOSIS — I10 Essential (primary) hypertension: Secondary | ICD-10-CM

## 2014-03-19 DIAGNOSIS — K219 Gastro-esophageal reflux disease without esophagitis: Secondary | ICD-10-CM

## 2014-03-19 NOTE — Assessment & Plan Note (Signed)
Well controlled.  Continue current medications and low sodium Dash type diet.    

## 2014-03-19 NOTE — Assessment & Plan Note (Signed)
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

## 2014-03-19 NOTE — Patient Instructions (Signed)
Your physician wants you to follow-up in: YEAR WITH DR NISHAN  You will receive a reminder letter in the mail two months in advance. If you don't receive a letter, please call our office to schedule the follow-up appointment.  Your physician recommends that you continue on your current medications as directed. Please refer to the Current Medication list given to you today. 

## 2014-03-19 NOTE — Progress Notes (Signed)
Patient ID: [name], female   DOB: 1960-09-06, 53 y.o.   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 .. Had normal cath in [facility] about 8 years ago Chronically abnormal ECG  No dyspnea palpitations or syncope. She is under some family stress and has a new puppy she is aggravated by  Myovue 03/21/12 normal EF 65% Short burst of PSVT BP still borderline. Still aggravated at work at [employer]  BP running better including home readings   Uncomplicated renal stone surgery since last visit  Having what sounded like esophageal spasm  EGD by Buccini no structural problem   ROS: Denies fever, malais, weight loss, blurry vision, decreased visual acuity, cough, sputum, SOB, hemoptysis, pleuritic pain, palpitaitons, heartburn, abdominal pain, melena, lower extremity edema, claudication, or rash.  All other systems reviewed and negative  General: Affect appropriate Healthy:  appears stated age 53: normal Neck supple with no adenopathy JVP normal no bruits no thyromegaly Lungs clear with no wheezing and good diaphragmatic motion Heart:  S1/S2 no murmur, no rub, gallop or click PMI normal Abdomen: benighn, BS positve, no tenderness, no AAA no bruit.  No HSM or HJR Distal pulses intact with no bruits No edema Neuro non-focal Skin warm and dry No muscular weakness   Current Outpatient Prescriptions  Medication Sig Dispense Refill  . amoxicillin-clavulanate (AUGMENTIN) 875-125 MG per tablet Take 1 tablet by mouth 2 (two) times daily. One po bid x 7 days  20 tablet  0  . aspirin-acetaminophen-caffeine (EXCEDRIN MIGRAINE) 115-520-80 MG per tablet Take 1 tablet by mouth every 6 (six) hours as needed for pain.      Marland Kitchen HYDROmorphone (DILAUDID) 4 MG tablet Take one every 8 hours for pain not  Helped by percocet  15 tablet  0  . magnesium hydroxide (MILK OF MAGNESIA) 400 MG/5ML suspension Take by mouth daily. TAKES X4 TABLETS DAILY --  NOT THE SUSPENSION      .  senna-docusate (SENOKOT S) 8.6-50 MG per tablet Take 1 tablet by mouth 2 (two) times daily.  60 tablet  0   No current facility-administered medications for this visit.    Allergies  Cephalexin  Electrocardiogram: 9/14  SR nonspecific ST changes  Today SR rate 72 LAD low voltage nonspecific ST changes persist  Assessment and Plan

## 2014-04-28 ENCOUNTER — Other Ambulatory Visit: Payer: Self-pay | Admitting: Dermatology

## 2014-05-22 ENCOUNTER — Other Ambulatory Visit: Payer: Self-pay

## 2014-05-22 DIAGNOSIS — Z1231 Encounter for screening mammogram for malignant neoplasm of breast: Secondary | ICD-10-CM

## 2014-06-18 ENCOUNTER — Ambulatory Visit
Admission: RE | Admit: 2014-06-18 | Discharge: 2014-06-18 | Disposition: A | Discharge status: Home / Self Care | Payer: BC Managed Care – PPO | Source: Ambulatory Visit

## 2014-06-18 DIAGNOSIS — Z1231 Encounter for screening mammogram for malignant neoplasm of breast: Secondary | ICD-10-CM

## 2014-06-19 DIAGNOSIS — I219 Acute myocardial infarction, unspecified: Secondary | ICD-10-CM

## 2014-06-19 HISTORY — DX: Acute myocardial infarction, unspecified: I21.9

## 2014-08-24 ENCOUNTER — Other Ambulatory Visit (HOSPITAL_COMMUNITY): Payer: Self-pay | Admitting: Otolaryngology

## 2014-08-24 ENCOUNTER — Other Ambulatory Visit: Payer: Self-pay | Admitting: Otolaryngology

## 2014-08-24 DIAGNOSIS — C07 Malignant neoplasm of parotid gland: Secondary | ICD-10-CM

## 2014-08-24 DIAGNOSIS — M26609 Unspecified temporomandibular joint disorder, unspecified side: Secondary | ICD-10-CM

## 2014-08-24 DIAGNOSIS — H9201 Otalgia, right ear: Secondary | ICD-10-CM

## 2014-08-31 ENCOUNTER — Other Ambulatory Visit (HOSPITAL_COMMUNITY): Payer: Self-pay | Admitting: Specialist

## 2014-08-31 DIAGNOSIS — M5137 Other intervertebral disc degeneration, lumbosacral region: Secondary | ICD-10-CM

## 2014-09-01 ENCOUNTER — Other Ambulatory Visit: Payer: Self-pay | Admitting: Dermatology

## 2014-09-04 ENCOUNTER — Ambulatory Visit (HOSPITAL_COMMUNITY)
Admission: RE | Admit: 2014-09-04 | Discharge: 2014-09-04 | Disposition: A | Discharge status: Home / Self Care | Payer: BLUE CROSS/BLUE SHIELD | Source: Ambulatory Visit | Attending: Specialist | Admitting: Specialist

## 2014-09-04 DIAGNOSIS — M5137 Other intervertebral disc degeneration, lumbosacral region: Secondary | ICD-10-CM

## 2014-09-04 DIAGNOSIS — Z13820 Encounter for screening for osteoporosis: Secondary | ICD-10-CM | POA: Diagnosis not present

## 2014-09-08 ENCOUNTER — Other Ambulatory Visit (HOSPITAL_COMMUNITY): Payer: Self-pay

## 2014-09-08 ENCOUNTER — Ambulatory Visit (HOSPITAL_COMMUNITY): Payer: Self-pay

## 2014-09-11 ENCOUNTER — Ambulatory Visit (HOSPITAL_COMMUNITY)
Admission: RE | Admit: 2014-09-11 | Discharge: 2014-09-11 | Disposition: A | Discharge status: Home / Self Care | Payer: BLUE CROSS/BLUE SHIELD | Source: Ambulatory Visit | Attending: Otolaryngology | Admitting: Otolaryngology

## 2014-09-11 DIAGNOSIS — C07 Malignant neoplasm of parotid gland: Secondary | ICD-10-CM

## 2014-09-11 DIAGNOSIS — H9201 Otalgia, right ear: Secondary | ICD-10-CM | POA: Insufficient documentation

## 2014-09-11 DIAGNOSIS — R6884 Jaw pain: Secondary | ICD-10-CM | POA: Diagnosis not present

## 2014-09-11 MED ORDER — GADOBENATE DIMEGLUMINE 529 MG/ML IV SOLN
20.0000 mL | Freq: Once | INTRAVENOUS | Status: AC | PRN
  Administered 2014-09-11: 16 mL via INTRAVENOUS

## 2014-10-17 ENCOUNTER — Encounter (HOSPITAL_COMMUNITY): Payer: Self-pay | Admitting: Emergency Medicine

## 2014-10-17 ENCOUNTER — Emergency Department (HOSPITAL_COMMUNITY)
Admission: EM | Admit: 2014-10-17 | Discharge: 2014-10-17 | Disposition: A | Discharge status: Home / Self Care | Payer: BLUE CROSS/BLUE SHIELD | Attending: Emergency Medicine | Admitting: Emergency Medicine

## 2014-10-17 ENCOUNTER — Emergency Department (HOSPITAL_COMMUNITY): Payer: BLUE CROSS/BLUE SHIELD

## 2014-10-17 DIAGNOSIS — Y9389 Activity, other specified: Secondary | ICD-10-CM | POA: Insufficient documentation

## 2014-10-17 DIAGNOSIS — T7840XA Allergy, unspecified, initial encounter: Secondary | ICD-10-CM | POA: Insufficient documentation

## 2014-10-17 DIAGNOSIS — X58XXXA Exposure to other specified factors, initial encounter: Secondary | ICD-10-CM | POA: Diagnosis not present

## 2014-10-17 DIAGNOSIS — M1990 Unspecified osteoarthritis, unspecified site: Secondary | ICD-10-CM | POA: Insufficient documentation

## 2014-10-17 DIAGNOSIS — Y9289 Other specified places as the place of occurrence of the external cause: Secondary | ICD-10-CM | POA: Diagnosis not present

## 2014-10-17 DIAGNOSIS — Y998 Other external cause status: Secondary | ICD-10-CM | POA: Insufficient documentation

## 2014-10-17 DIAGNOSIS — Z85820 Personal history of malignant melanoma of skin: Secondary | ICD-10-CM | POA: Insufficient documentation

## 2014-10-17 DIAGNOSIS — Z87442 Personal history of urinary calculi: Secondary | ICD-10-CM | POA: Diagnosis not present

## 2014-10-17 DIAGNOSIS — R05 Cough: Secondary | ICD-10-CM | POA: Insufficient documentation

## 2014-10-17 DIAGNOSIS — Z79899 Other long term (current) drug therapy: Secondary | ICD-10-CM | POA: Diagnosis not present

## 2014-10-17 LAB — CBC WITH DIFFERENTIAL/PLATELET
BASOS ABS: 0 10*3/uL (ref 0.0–0.1)
Basophils Relative: 0 % (ref 0–1)
Eosinophils Absolute: 0 10*3/uL (ref 0.0–0.7)
Eosinophils Relative: 1 % (ref 0–5)
HCT: 39.7 % (ref 36.0–46.0)
Hemoglobin: 13.2 g/dL (ref 12.0–15.0)
LYMPHS ABS: 0.7 10*3/uL (ref 0.7–4.0)
LYMPHS PCT: 31 % (ref 12–46)
MCH: 31.1 pg (ref 26.0–34.0)
MCHC: 33.2 g/dL (ref 30.0–36.0)
MCV: 93.6 fL (ref 78.0–100.0)
Monocytes Absolute: 0.4 10*3/uL (ref 0.1–1.0)
Monocytes Relative: 16 % — ABNORMAL HIGH (ref 3–12)
NEUTROS ABS: 1.1 10*3/uL — AB (ref 1.7–7.7)
NEUTROS PCT: 52 % (ref 43–77)
PLATELETS: 116 10*3/uL — AB (ref 150–400)
RBC: 4.24 MIL/uL (ref 3.87–5.11)
RDW: 12.6 % (ref 11.5–15.5)
WBC: 2.2 10*3/uL — AB (ref 4.0–10.5)

## 2014-10-17 LAB — BASIC METABOLIC PANEL
Anion gap: 8 (ref 5–15)
BUN: 13 mg/dL (ref 6–23)
CHLORIDE: 108 mmol/L (ref 96–112)
CO2: 27 mmol/L (ref 19–32)
Calcium: 8.8 mg/dL (ref 8.4–10.5)
Creatinine, Ser: 0.67 mg/dL (ref 0.50–1.10)
GFR calc non Af Amer: >90 mL/min (ref >90)
Glucose, Bld: 92 mg/dL (ref 70–99)
POTASSIUM: 4 mmol/L (ref 3.5–5.1)
SODIUM: 143 mmol/L (ref 135–145)

## 2014-10-17 LAB — MONONUCLEOSIS SCREEN: Mono Screen: NEGATIVE

## 2014-10-17 LAB — RAPID STREP SCREEN (MED CTR MEBANE ONLY): STREPTOCOCCUS, GROUP A SCREEN (DIRECT): NEGATIVE

## 2014-10-17 MED ORDER — FAMOTIDINE IN NACL 20-0.9 MG/50ML-% IV SOLN
20.0000 mg | Freq: Once | INTRAVENOUS | Status: AC
  Administered 2014-10-17: 20 mg via INTRAVENOUS
  Filled 2014-10-17: qty 50

## 2014-10-17 MED ORDER — METHYLPREDNISOLONE SODIUM SUCC 125 MG IJ SOLR
125.0000 mg | Freq: Once | INTRAMUSCULAR | Status: AC
  Administered 2014-10-17: 125 mg via INTRAVENOUS
  Filled 2014-10-17: qty 2

## 2014-10-17 MED ORDER — IOHEXOL 300 MG/ML  SOLN
80.0000 mL | Freq: Once | INTRAMUSCULAR | Status: AC | PRN
  Administered 2014-10-17: 80 mL via INTRAVENOUS

## 2014-10-17 MED ORDER — SODIUM CHLORIDE 0.9 % IV BOLUS (SEPSIS)
1000.0000 mL | Freq: Once | INTRAVENOUS | Status: AC
  Administered 2014-10-17: 1000 mL via INTRAVENOUS

## 2014-10-17 MED ORDER — DIPHENHYDRAMINE HCL 50 MG/ML IJ SOLN
25.0000 mg | Freq: Once | INTRAMUSCULAR | Status: AC
  Administered 2014-10-17: 25 mg via INTRAVENOUS
  Filled 2014-10-17: qty 1

## 2014-10-17 MED ORDER — PREDNISONE 50 MG PO TABS: ORAL_TABLET | ORAL | Status: DC

## 2014-10-17 MED ORDER — AMOXICILLIN 500 MG PO CAPS: 500.0000 mg | ORAL_CAPSULE | Freq: Three times a day (TID) | ORAL | Status: DC

## 2014-10-17 MED ORDER — SODIUM CHLORIDE 0.9 % IJ SOLN
INTRAMUSCULAR | Status: AC
  Filled 2014-10-17: qty 600

## 2014-10-17 MED ORDER — AMOXICILLIN 250 MG PO CAPS
500.0000 mg | ORAL_CAPSULE | Freq: Once | ORAL | Status: AC
  Administered 2014-10-17: 500 mg via ORAL
  Filled 2014-10-17: qty 2

## 2014-10-17 NOTE — ED Notes (Signed)
PT c/o dry cough, swelling to throat, areas to inner nostrils and right ear pain x2 days post dental work this past week. PT states long hx of melanoma to right side of face with radiation and multiple MRI's to the same area.

## 2014-10-17 NOTE — ED Provider Notes (Signed)
CSN: [id number]     Arrival date & time 10/17/14  1509 History   First MD Initiated Contact with Patient 10/17/14 1541     Chief Complaint  Patient presents with  . Cough     (Consider location/radiation/quality/duration/timing/severity/associated sxs/prior Treatment) HPI.....Marland Kitchen feeling of fullness in her throat and congestion in her chest, trouble swallowing for 2 days. Status post root canal and crown on right lower tooth approximately 1 week ago. Patient also has history of parotid gland cancer and melanoma.  Patient is able to swallow. No rash, pruritus, fever, chills, chest pain, dyspnea.  Past Medical History  Diagnosis Date  . PONV (postoperative nausea and vomiting)   . Headache(784.0)   . Arthritis     back   . Left ureteral calculus   . History of kidney stones   . History of melanoma excision     2001--  RIGHT SUPRAORBITAL (RIGHT FOREHEAD AND UPPER EYELID ----  S/P MOHS PROCDURE W/ SLN BX----   NO RECURRENCE  . History of parotidectomy     09-12-2006---   MYOEPITHIOMA  OF PAROTID SALVERY GLAND --  X35  RADIATION TX  COMPLETED AUGUST 2008--   NO RECURRENCE  . History of palpitations     STRESS INDUCED   Past Surgical History  Procedure Laterality Date  . Diagnostic laparoscopy  2010  . Kidney surgery  at age 1    [place]  . Melanoma excision with sentinel lymph node biopsy  2001    moh's procedure/  RIGHT FOREHEAD AND UPPER EYELID  . Lumbar laminectomy/decompression microdiscectomy  05/18/2011    Procedure: LUMBAR LAMINECTOMY/DECOMPRESSION MICRODISCECTOMY;  Surgeon: Johnn Hai;  Location: [facility];  Service: Orthopedics;  Laterality: Right;  Decompression Lumbar 4-Lumbar 5  Right    (xray)   . Cystoscopy with retrograde pyelogram, ureteroscopy and stent placement Bilateral 03/19/2013    Procedure: CYSTOSCOPY WITH RETROGRADE PYELOGRAM, BILATERAL URETEROSCOPY AND STENT PLACEMENT LEFT URETER,BILATERAL STONE EXTRACTION , HOLMIUM LASER LEFT  URETER;  Surgeon: Molli Hazard, MD;  Location: [facility];  Service: Urology;  Laterality: Bilateral;  . Right lateral parotidectomy w/ nerve dissection / right modified radical neck dissection sparing scm eleventh nerve and internal jugular vein  09-12-2006  DR DWIGHT BATES  . Diagnostic laparoscopy  04-12-2009  . Cardiovascular stress test  03-21-2012  DR [facility]    NORMAL NUCLEAR STUDY/  NO ISCHEMIA/  EF 63%  . Cardiac catheterization  2006 (APPROX)  [place]    NORMAL  . Breast surgery  2005- x 2 times    benign breast biopsies-bilateral  . Cystoscopy w/ ureteral stent placement Left 03/26/2013    Procedure: CYSTOSCOPY WITH RETROGRADE PYELOGRAM ;  Surgeon: Molli Hazard, MD;  Location: [facility];  Service: Urology;  Laterality: Left;  . Cystoscopy with stent placement Left 03/26/2013    Procedure: CYSTOSCOPY WITH STENT PLACEMENT;  Surgeon: Molli Hazard, MD;  Location: [facility];  Service: Urology;  Laterality: Left;   Family History  Problem Relation Age of Onset  . Hypertension Mother   . COPD Mother   . Cancer Mother     breast  . Dementia Mother   . Heart disease Father   . Cancer Father     Colorectal  . Hyperlipidemia Father   . Hypertension Father    History  Substance Use Topics  . Smoking status: Never Smoker   . Smokeless tobacco: Never Used  . Alcohol Use: No   OB  History    No data available     Review of Systems    Allergies  Cephalexin  Home Medications   Prior to Admission medications   Medication Sig Start Date End Date Taking? Authorizing Provider  aspirin-acetaminophen-caffeine (EXCEDRIN MIGRAINE) [phone] MG per tablet Take 2 tablets by mouth daily as needed for headache or migraine.    Yes Historical Provider, MD  Calcium Carbonate-Vitamin D (CALCIUM + D PO) Take 1 tablet by mouth daily.   Yes Historical Provider, MD  loratadine (CLARITIN) 10 MG tablet Take 10 mg by mouth daily as  needed for allergies.    Yes Historical Provider, MD  methocarbamol (ROBAXIN) 500 MG tablet Take 500 mg by mouth daily as needed for muscle spasms (associated with back pain).   Yes Historical Provider, MD  Multiple Vitamin (MULTIVITAMIN WITH MINERALS) TABS tablet Take 1 tablet by mouth daily.   Yes Historical Provider, MD  amoxicillin (AMOXIL) 500 MG capsule Take 1 capsule (500 mg total) by mouth 3 (three) times daily. 10/17/14   Nat Christen, MD  predniSONE (DELTASONE) 50 MG tablet One tab daily for 5 days;  One half tab daily for five days 10/17/14   Nat Christen, MD   BP 130/86 mmHg  Pulse 88  Temp(Src) 97.7 F (36.5 C) (Oral)  Resp 20  Ht 5\' 4"  (1.626 m)  Wt 175 lb (79.379 kg)  BMI 30.02 kg/m2  SpO2 97%  LMP 05/18/2011 Physical Exam  Constitutional: She is oriented to person, place, and time. She appears well-developed and well-nourished.  HENT:  Head: Normocephalic and atraumatic.  Normal-appearing oral pharyngeal area.  Eyes: Conjunctivae and EOM are normal. Pupils are equal, round, and reactive to light.  Neck: Normal range of motion. Neck supple.  Cardiovascular: Normal rate and regular rhythm.   Pulmonary/Chest: Effort normal and breath sounds normal.  No wheezing.  Abdominal: Soft. Bowel sounds are normal.  Musculoskeletal: Normal range of motion.  Neurological: She is alert and oriented to person, place, and time.  Skin: Skin is warm and dry.  Psychiatric: She has a normal mood and affect. Her behavior is normal.  Nursing note and vitals reviewed.   ED Course  Procedures (including critical care time) Labs Review Labs Reviewed  CBC WITH DIFFERENTIAL/PLATELET - Abnormal; Notable for the following:    WBC 2.2 (*)    Platelets 116 (*)    Neutro Abs 1.1 (*)    Monocytes Relative 16 (*)    All other components within normal limits  RAPID STREP SCREEN  CULTURE, GROUP A STREP  BASIC METABOLIC PANEL  MONONUCLEOSIS SCREEN    Imaging Review Ct Soft Tissue Neck W  Contrast  10/17/2014   CLINICAL DATA:  Throat swelling. Cough. Right-sided pain. Dental surgery this week. History of right facial melanoma.  EXAM: CT NECK WITH CONTRAST  TECHNIQUE: Multidetector CT imaging of the neck was performed using the standard protocol following the bolus administration of intravenous contrast.  CONTRAST:  14mL OMNIPAQUE IOHEXOL 300 MG/ML  SOLN  COMPARISON:  09/11/2014  FINDINGS: Pharynx and larynx: No mucosal or submucosal mass.  Salivary glands: Left submandibular gland and left parotid gland appear normal. Previous resection of the right parotid gland and right submandibular gland. Scarring in those regions without evidence of residual or recurrent tumor.  Thyroid: Normal.  Atrophy of the right lobe.  Lymph nodes: No enlarged lymph nodes visible on either side of the neck.  Vascular: Some atherosclerosis of the carotid arteries but no evidence of acute dissection.  No venous thrombosis.  Limited intracranial: Normal  Visualized orbits: None included  Mastoids and visualized paranasal sinuses: Visualized paranasal sinuses are clear. Left mastoid is clear. 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  Skeleton: Minimal mid cervical spondylosis. No acute or destructive lesion temporomandibular joints appear normal.  Upper chest: No active process. Para median scarring on the right related to previous radiation.  IMPRESSION: No evidence of residual or recurrent disease on the right. No mass or lymphadenopathy. Stable appearance compared to previous studies. Chronic right mastoid effusion, presumably due to some compromise of the right eustachian tube related to the previous treatment.   Electronically Signed   By: Nelson Chimes M.D.   On: 10/17/2014 17:41     EKG Interpretation None      MDM   Final diagnoses:  Allergic reaction, initial encounter    Uncertain etiology of patient's symptom complex. Rx IV Soluedrol, IV Benadryl, IV Pepcid. She feels better after medication. Additionally  patient feels she maybe developing a tooth infection. Discharge medications amoxicillin 500 mg and prednisone. Patient is hemodynamically stable at discharge.  She has primary care follow-up    Nat Christen, MD 10/17/14 2030

## 2014-10-17 NOTE — Discharge Instructions (Signed)
Prescription for antibiotic and prednisone. Continue your Claritin. Follow-up with your primary care doctor.

## 2014-10-17 NOTE — ED Notes (Addendum)
Family at bedside. 

## 2014-10-17 NOTE — ED Notes (Signed)
MD at bedside. 

## 2014-10-20 LAB — CULTURE, GROUP A STREP: Strep A Culture: NEGATIVE

## 2014-10-22 ENCOUNTER — Emergency Department (HOSPITAL_COMMUNITY): Payer: BLUE CROSS/BLUE SHIELD

## 2014-10-22 ENCOUNTER — Emergency Department (HOSPITAL_COMMUNITY)
Admission: EM | Admit: 2014-10-22 | Discharge: 2014-10-22 | Disposition: A | Discharge status: Home / Self Care | Payer: BLUE CROSS/BLUE SHIELD | Attending: Emergency Medicine | Admitting: Emergency Medicine

## 2014-10-22 ENCOUNTER — Encounter (HOSPITAL_COMMUNITY): Payer: Self-pay | Admitting: Emergency Medicine

## 2014-10-22 DIAGNOSIS — M1990 Unspecified osteoarthritis, unspecified site: Secondary | ICD-10-CM | POA: Insufficient documentation

## 2014-10-22 DIAGNOSIS — Z792 Long term (current) use of antibiotics: Secondary | ICD-10-CM | POA: Diagnosis not present

## 2014-10-22 DIAGNOSIS — R079 Chest pain, unspecified: Secondary | ICD-10-CM | POA: Insufficient documentation

## 2014-10-22 DIAGNOSIS — Z79899 Other long term (current) drug therapy: Secondary | ICD-10-CM | POA: Diagnosis not present

## 2014-10-22 DIAGNOSIS — R070 Pain in throat: Secondary | ICD-10-CM | POA: Diagnosis not present

## 2014-10-22 DIAGNOSIS — Z9889 Other specified postprocedural states: Secondary | ICD-10-CM | POA: Diagnosis not present

## 2014-10-22 DIAGNOSIS — Z87442 Personal history of urinary calculi: Secondary | ICD-10-CM | POA: Diagnosis not present

## 2014-10-22 DIAGNOSIS — Z85820 Personal history of malignant melanoma of skin: Secondary | ICD-10-CM | POA: Diagnosis not present

## 2014-10-22 LAB — BASIC METABOLIC PANEL
Anion gap: 9 (ref 5–15)
BUN: 23 mg/dL — ABNORMAL HIGH (ref 6–20)
CHLORIDE: 102 mmol/L (ref 101–111)
CO2: 29 mmol/L (ref 22–32)
Calcium: 9.4 mg/dL (ref 8.9–10.3)
Creatinine, Ser: 0.92 mg/dL (ref 0.44–1.00)
GFR calc Af Amer: >60 mL/min (ref >60)
Glucose, Bld: 124 mg/dL — ABNORMAL HIGH (ref 70–99)
POTASSIUM: 4.1 mmol/L (ref 3.5–5.1)
SODIUM: 140 mmol/L (ref 135–145)

## 2014-10-22 LAB — CBC
HCT: 42.8 % (ref 36.0–46.0)
HEMOGLOBIN: 14.2 g/dL (ref 12.0–15.0)
MCH: 30.8 pg (ref 26.0–34.0)
MCHC: 33.2 g/dL (ref 30.0–36.0)
MCV: 92.8 fL (ref 78.0–100.0)
PLATELETS: 162 10*3/uL (ref 150–400)
RBC: 4.61 MIL/uL (ref 3.87–5.11)
RDW: 12.6 % (ref 11.5–15.5)
WBC: 5.8 10*3/uL (ref 4.0–10.5)

## 2014-10-22 LAB — TROPONIN I: Troponin I: <0.03 ng/mL (ref <0.031)

## 2014-10-22 MED ORDER — SODIUM CHLORIDE 0.9 % IV BOLUS (SEPSIS): 1000.0000 mL | Freq: Once | INTRAVENOUS | Status: DC

## 2014-10-22 MED ORDER — METOPROLOL TARTRATE 1 MG/ML IV SOLN
INTRAVENOUS | Status: AC
  Filled 2014-10-22: qty 5

## 2014-10-22 NOTE — ED Notes (Signed)
Patient states "I haven't been feeling well lately and I was seen here Saturday for a possible allergic reaction. States she is not feeling any better and went to her doctors office but they could not see her. States "about an hour ago I started having some discomfort in my left chest and some nausea." States the pain feels like a "tugging or squeezing."

## 2014-10-22 NOTE — Discharge Instructions (Signed)
Follow up with your family md and dr. Redmond Baseman next week.

## 2014-10-22 NOTE — ED Provider Notes (Signed)
CSN: [id number]     Arrival date & time 10/22/14  1703 History   First MD Initiated Contact with Patient 10/22/14 1736     Chief Complaint  Patient presents with  . Chest Pain     (Consider location/radiation/quality/duration/timing/severity/associated sxs/prior Treatment) Patient is a 54 y.o. female presenting with chest pain. The history is provided by the patient (pt states she had some chest pain today.  improved now).  Chest Pain Pain location:  L chest Pain quality: aching   Pain radiates to:  Does not radiate Pain radiates to the back: no   Pain severity:  No pain Onset quality:  Sudden Timing:  Intermittent Progression:  Resolved Associated symptoms: no abdominal pain, no back pain, no cough, no fatigue and no headache     Past Medical History  Diagnosis Date  . PONV (postoperative nausea and vomiting)   . Headache(784.0)   . Arthritis     back   . Left ureteral calculus   . History of kidney stones   . History of melanoma excision     2001--  RIGHT SUPRAORBITAL (RIGHT FOREHEAD AND UPPER EYELID ----  S/P MOHS PROCDURE W/ SLN BX----   NO RECURRENCE  . History of parotidectomy     09-12-2006---   MYOEPITHIOMA  OF PAROTID SALVERY GLAND --  X35  RADIATION TX  COMPLETED AUGUST 2008--   NO RECURRENCE  . History of palpitations     STRESS INDUCED   Past Surgical History  Procedure Laterality Date  . Diagnostic laparoscopy  2010  . Kidney surgery  at age 89    [place]  . Melanoma excision with sentinel lymph node biopsy  2001    moh's procedure/  RIGHT FOREHEAD AND UPPER EYELID  . Lumbar laminectomy/decompression microdiscectomy  05/18/2011    Procedure: LUMBAR LAMINECTOMY/DECOMPRESSION MICRODISCECTOMY;  Surgeon: Johnn Hai;  Location: [facility];  Service: Orthopedics;  Laterality: Right;  Decompression Lumbar 4-Lumbar 5  Right    (xray)   . Cystoscopy with retrograde pyelogram, ureteroscopy and stent placement Bilateral 03/19/2013     Procedure: CYSTOSCOPY WITH RETROGRADE PYELOGRAM, BILATERAL URETEROSCOPY AND STENT PLACEMENT LEFT URETER,BILATERAL STONE EXTRACTION , HOLMIUM LASER LEFT URETER;  Surgeon: Molli Hazard, MD;  Location: [facility];  Service: Urology;  Laterality: Bilateral;  . Right lateral parotidectomy w/ nerve dissection / right modified radical neck dissection sparing scm eleventh nerve and internal jugular vein  09-12-2006  DR DWIGHT BATES  . Diagnostic laparoscopy  04-12-2009  . Cardiovascular stress test  03-21-2012  DR [facility]    NORMAL NUCLEAR STUDY/  NO ISCHEMIA/  EF 63%  . Cardiac catheterization  2006 (APPROX)  [place]    NORMAL  . Breast surgery  2005- x 2 times    benign breast biopsies-bilateral  . Cystoscopy w/ ureteral stent placement Left 03/26/2013    Procedure: CYSTOSCOPY WITH RETROGRADE PYELOGRAM ;  Surgeon: Molli Hazard, MD;  Location: [facility];  Service: Urology;  Laterality: Left;  . Cystoscopy with stent placement Left 03/26/2013    Procedure: CYSTOSCOPY WITH STENT PLACEMENT;  Surgeon: Molli Hazard, MD;  Location: [facility];  Service: Urology;  Laterality: Left;   Family History  Problem Relation Age of Onset  . Hypertension Mother   . COPD Mother   . Cancer Mother     breast  . Dementia Mother   . Heart disease Father   . Cancer Father     Colorectal  . Hyperlipidemia  Father   . Hypertension Father    History  Substance Use Topics  . Smoking status: Never Smoker   . Smokeless tobacco: Never Used  . Alcohol Use: No   OB History    No data available     Review of Systems  Constitutional: Negative for appetite change and fatigue.  HENT: Negative for congestion, ear discharge and sinus pressure.   Eyes: Negative for discharge.  Respiratory: Negative for cough.   Cardiovascular: Positive for chest pain.  Gastrointestinal: Negative for abdominal pain and diarrhea.  Genitourinary: Negative for frequency and  hematuria.  Musculoskeletal: Negative for back pain.  Skin: Negative for rash.  Neurological: Negative for seizures and headaches.  Psychiatric/Behavioral: Negative for hallucinations.      Allergies  Cephalexin  Home Medications   Prior to Admission medications   Medication Sig Start Date End Date Taking? Authorizing Provider  amoxicillin (AMOXIL) 500 MG capsule Take 1 capsule (500 mg total) by mouth 3 (three) times daily. 10/17/14  Yes Nat Christen, MD  aspirin-acetaminophen-caffeine [facility] MIGRAINE) [phone] MG per tablet Take 2 tablets by mouth daily as needed for headache or migraine.    Yes Historical Provider, MD  Calcium Carbonate-Vitamin D (CALCIUM + D PO) Take 1 tablet by mouth daily.   Yes Historical Provider, MD  dextromethorphan (DELSYM) 30 MG/5ML liquid Take 60 mg by mouth as needed for cough.   Yes Historical Provider, MD  loratadine (CLARITIN) 10 MG tablet Take 10 mg by mouth daily as needed for allergies.    Yes Historical Provider, MD  methocarbamol (ROBAXIN) 500 MG tablet Take 500 mg by mouth daily as needed for muscle spasms (associated with back pain).   Yes Historical Provider, MD  Multiple Vitamin (MULTIVITAMIN WITH MINERALS) TABS tablet Take 1 tablet by mouth daily.   Yes Historical Provider, MD  predniSONE (DELTASONE) 50 MG tablet One tab daily for 5 days;  One half tab daily for five days Patient taking differently: Take 25-50 mg by mouth See admin instructions. One tab daily for 5 days;  One-half tab daily for five days 10/17/14  Yes Nat Christen, MD   BP 140/65 mmHg  Pulse 61  Temp(Src) 97.8 F (36.6 C) (Oral)  Resp 16  Ht 5\' 4"  (1.626 m)  Wt 175 lb (79.379 kg)  BMI 30.02 kg/m2  SpO2 98%  LMP 05/18/2011 Physical Exam  Constitutional: She is oriented to person, place, and time. She appears well-developed.  HENT:  Head: Normocephalic.  Eyes: Conjunctivae and EOM are normal. No scleral icterus.  Neck: Neck supple. No thyromegaly present.   Cardiovascular: Normal rate and regular rhythm.  Exam reveals no gallop and no friction rub.   No murmur heard. Pulmonary/Chest: No stridor. She has no wheezes. She has no rales. She exhibits no tenderness.  Abdominal: She exhibits no distension. There is no tenderness. There is no rebound.  Musculoskeletal: Normal range of motion. She exhibits no edema.  Lymphadenopathy:    She has no cervical adenopathy.  Neurological: She is oriented to person, place, and time. She exhibits normal muscle tone. Coordination normal.  Skin: No rash noted. No erythema.  Psychiatric: She has a normal mood and affect. Her behavior is normal.    ED Course  Procedures (including critical care time) Labs Review Labs Reviewed  BASIC METABOLIC PANEL - Abnormal; Notable for the following:    Glucose, Bld 124 (*)    BUN 23 (*)    All other components within normal limits  CBC  TROPONIN  I    Imaging Review Dg Chest Port 1 View  10/22/2014   CLINICAL DATA:  Left sided chest pain, nausea x 2 hours. Short of breath x 6 days.  EXAM: PORTABLE CHEST - 1 VIEW  COMPARISON:  Chest CT 04/06/2008.  FINDINGS: Cardiopericardial silhouette within normal limits. Mediastinal contours normal. Trachea midline. No airspace disease or effusion. Monitoring leads project over the chest. Healed RIGHT clavicle fracture.  IMPRESSION: No active disease.   Electronically Signed   By: Dereck Ligas M.D.   On: 10/22/2014 18:16     EKG Interpretation   Date/Time:  Thursday Oct 22 2014 17:24:53 EDT Ventricular Rate:  66 PR Interval:  124 QRS Duration: 91 QT Interval:  398 QTC Calculation: 417 R Axis:   -32 Text Interpretation:  Sinus rhythm Left axis deviation Low voltage,  precordial leads Abnormal R-wave progression, late transition Nonspecific  T abnormalities, anterior leads Confirmed by Rowyn Mustapha  MD, Nnaemeka Samson ([id number]) on  10/22/2014 7:42:20 PM      MDM   Final diagnoses:  None    Throat discomfort and chest pain.  Pt to  follow up with pcp and her ent md    Milton Ferguson, MD 10/22/14 2038

## 2014-10-30 ENCOUNTER — Ambulatory Visit (INDEPENDENT_AMBULATORY_CARE_PROVIDER_SITE_OTHER): Payer: BLUE CROSS/BLUE SHIELD | Admitting: Family Medicine

## 2014-10-30 ENCOUNTER — Encounter: Payer: Self-pay | Admitting: Family Medicine

## 2014-10-30 ENCOUNTER — Other Ambulatory Visit: Payer: Self-pay | Admitting: Family Medicine

## 2014-10-30 ENCOUNTER — Encounter (INDEPENDENT_AMBULATORY_CARE_PROVIDER_SITE_OTHER): Payer: Self-pay

## 2014-10-30 VITALS — BP 129/71 | HR 82 | Temp 97.3°F | Ht 63.0 in | Wt 180.0 lb

## 2014-10-30 DIAGNOSIS — R072 Precordial pain: Secondary | ICD-10-CM

## 2014-10-30 DIAGNOSIS — M25519 Pain in unspecified shoulder: Secondary | ICD-10-CM

## 2014-10-30 DIAGNOSIS — R1310 Dysphagia, unspecified: Secondary | ICD-10-CM

## 2014-10-30 MED ORDER — METOCLOPRAMIDE HCL 5 MG PO TABS: 5.0000 mg | ORAL_TABLET | Freq: Two times a day (BID) | ORAL | Status: DC

## 2014-10-30 NOTE — Progress Notes (Signed)
Subjective:  Patient ID: [name], female    DOB: June 14, 1961  Age: 54 y.o. MRN: [id number]  CC: pain between shoulder blades   HPI [name] presents for [name] presents for having had 3 weeks of intermittent chest pains. She relates this back to when she had a crown placed on Tuesday that had just had a root canal a few days before her. Weeks before. She was placed on amoxicillin. During that time as well. Of note is that during the procedure she feels like she swallowed something. This was felt to probably be some kind of filings from the tooth or glue or itching from the procedure. Shortly after that a sub-sternal and throat pain began happening intermittently. There was nothing known that brought these pains on. A cane daily for all day long with the throat now there was substernal pain in the mid chest that would come and go for just a few minutes through the day. This was not related to any specific exertion nor was it related to food. Her ability to swallow was not compromised. Correct she has been to the emergency room twice had EKGs and blood work and those are attached and have been reviewed with her and. Prior to the visit. She notes that the substernal pain is what she was having when she went to the emergency department for the EKGs. He was placed on prednisone at one point in the emergency department as well as Benadryl. She's also been told to take Claritin.  This morning the pain took a turn for the worse by changing its location. While she was standing at the kitchen sink fixing breakfast she experienced a sudden severe sharp pain radiating from one shoulder blade to the other. This hit so hard it was 12/10 and it lasted for about 5 minutes she laid down and took a nitroglycerin that her GI doctor had recommended. The pain went away and has not returned. However she has had some of the anterior chest pain. There was some nauseated associated with the severe episode this  morning. However she's not had any constipation or diarrhea. She is not experienced any choking. Been no fever and no chills.  Patient relates that she has had some reading difficulty and some dry cough for which she's been taking Delsym and other over-the-counter products similarly. She cannot cough anything up  History [name] has a past medical history of PONV (postoperative nausea and vomiting); Headache(784.0); Arthritis; Left ureteral calculus; History of kidney stones; History of melanoma excision; History of parotidectomy; and History of palpitations.   She has past surgical history that includes Diagnostic laparoscopy (2010); Kidney surgery (at age 52); Melanoma excision with sentinel lymph node dissection (2001); Lumbar laminectomy/decompression microdiscectomy (05/18/2011); Cystoscopy with retrograde pyelogram, ureteroscopy and stent placement (Bilateral, 03/19/2013); RIGHT LATERAL PAROTIDECTOMY W/ NERVE DISSECTION / RIGHT MODIFIED RADICAL NECK DISSECTION SPARING SCM ELEVENTH NERVE AND INTERNAL JUGULAR VEIN (09-12-2006  DR DWIGHT BATES); Diagnostic laparoscopy (04-12-2009); Cardiovascular stress test (03-21-2012  DR Johnsie Cancel); Cardiac catheterization (2006 (APPROX)  [place]); Breast surgery (2005- x 2 times); Cystoscopy w/ ureteral stent placement (Left, 03/26/2013); and Cystoscopy with stent placement (Left, 03/26/2013).   Her family history includes COPD in her mother; Cancer in her father and mother; Dementia in her mother; Heart disease in her father; Hyperlipidemia in her father; Hypertension in her father and mother.She reports that she has never smoked. She has never used smokeless tobacco. She reports that she does not drink alcohol or  use illicit drugs.  Current Outpatient Prescriptions on File Prior to Visit  Medication Sig Dispense Refill  . aspirin-acetaminophen-caffeine (EXCEDRIN MIGRAINE) 250-250-65 MG per tablet Take 2 tablets by mouth daily as needed for headache or migraine.      . Calcium Carbonate-Vitamin D (CALCIUM + D PO) Take 1 tablet by mouth daily.    . loratadine (CLARITIN) 10 MG tablet Take 10 mg by mouth daily as needed for allergies.     . methocarbamol (ROBAXIN) 500 MG tablet Take 500 mg by mouth daily as needed for muscle spasms (associated with back pain).    . Multiple Vitamin (MULTIVITAMIN WITH MINERALS) TABS tablet Take 1 tablet by mouth daily.     No current facility-administered medications on file prior to visit.    ROS Review of Systems  Constitutional: Negative for fever, chills, diaphoresis, appetite change, fatigue and unexpected weight change.  HENT: Negative for congestion, ear pain, hearing loss, postnasal drip, rhinorrhea, sneezing, sore throat and trouble swallowing.   Eyes: Negative for pain.  Respiratory: Positive for cough. Negative for chest tightness, shortness of breath and wheezing.   Cardiovascular: Positive for chest pain. Negative for palpitations.  Gastrointestinal: Negative for nausea, vomiting, abdominal pain, diarrhea and constipation.  Genitourinary: Negative for dysuria, frequency and menstrual problem.  Musculoskeletal: Negative for joint swelling and arthralgias.  Skin: Negative for rash.  Neurological: Negative for dizziness, weakness, numbness and headaches.  Psychiatric/Behavioral: Negative for dysphoric mood and agitation.    Objective:  BP 129/71 mmHg  Pulse 82  Temp(Src) 97.3 F (36.3 C) (Oral)  Ht 5' 3" (1.6 m)  Wt 180 lb (81.647 kg)  BMI 31.89 kg/m2  LMP 05/18/2011  BP Readings from Last 3 Encounters:  10/30/14 129/71  10/22/14 158/73  10/17/14 130/86    Wt Readings from Last 3 Encounters:  10/30/14 180 lb (81.647 kg)  10/22/14 175 lb (79.379 kg)  10/17/14 175 lb (79.379 kg)     Physical Exam  Constitutional: She is oriented to person, place, and time. She appears well-developed and well-nourished. No distress.  HENT:  Head: Normocephalic and atraumatic.  Right Ear: External ear normal.   Left Ear: External ear normal.  Nose: Nose normal.  Mouth/Throat: Oropharynx is clear and moist.  Eyes: Conjunctivae and EOM are normal. Pupils are equal, round, and reactive to light.  Neck: Normal range of motion. Neck supple. No thyromegaly present.  Cardiovascular: Normal rate, regular rhythm and normal heart sounds.   No murmur heard. Pulmonary/Chest: Effort normal and breath sounds normal. No respiratory distress. She has no wheezes. She has no rales.  Abdominal: Soft. Bowel sounds are normal. She exhibits no distension. There is no tenderness.  Lymphadenopathy:    She has no cervical adenopathy.  Neurological: She is alert and oriented to person, place, and time. She has normal reflexes.  Skin: Skin is warm and dry.  Psychiatric: She has a normal mood and affect. Her behavior is normal. Judgment and thought content normal.    No results found for: HGBA1C  Vazquez Results  Component Value Date   WBC 5.8 10/22/2014   HGB 14.2 10/22/2014   HCT 42.8 10/22/2014   PLT 162 10/22/2014   GLUCOSE 124* 10/22/2014   CHOL 204* 02/03/2013   TRIG 83 02/03/2013   HDL 53 02/03/2013   LDLCALC 134* 02/03/2013   ALT 13 06/15/2013   AST 17 06/15/2013   NA 140 10/22/2014   K 4.1 10/22/2014   CL 102 10/22/2014   CREATININE 0.92 10/22/2014     BUN 23* 10/22/2014   CO2 29 10/22/2014   TSH 1.021 12/05/2007   INR 0.94 05/15/2011    Dg Chest Port 1 View  10/22/2014   CLINICAL DATA:  Left sided chest pain, nausea x 2 hours. Short of breath x 6 days.  EXAM: PORTABLE CHEST - 1 VIEW  COMPARISON:  Chest CT 04/06/2008.  FINDINGS: Cardiopericardial silhouette within normal limits. Mediastinal contours normal. Trachea midline. No airspace disease or effusion. Monitoring leads project over the chest. Healed RIGHT clavicle fracture.  IMPRESSION: No active disease.   Electronically Signed   By: Geoffrey  Lamke M.D.   On: 10/22/2014 18:16    Assessment & Plan:   [name] was seen today for pain between  shoulder blades.  Diagnoses and all orders for this visit:  Precordial pain Orders: -     Cancel: POCT CBC -     CMP14+EGFR -     Troponin T -     CK total and CKMB (cardiac) -     DG Esophagus; Future -     US Abdomen Limited RUQ; Future -     TSH -     EKG 12-Lead -     CBC with Differential/Platelet  Pain in joint, shoulder region, unspecified laterality  Other orders -     metoCLOPramide (REGLAN) 5 MG tablet; Take 1 tablet (5 mg total) by mouth 2 (two) times daily.   I have discontinued Ms. Theall's amoxicillin, predniSONE, and dextromethorphan. I am also having her start on metoCLOPramide. Additionally, I am having her maintain her aspirin-acetaminophen-caffeine, loratadine, methocarbamol, multivitamin with minerals, and Calcium Carbonate-Vitamin D (CALCIUM + D PO).  Meds ordered this encounter  Medications  . metoCLOPramide (REGLAN) 5 MG tablet    Sig: Take 1 tablet (5 mg total) by mouth 2 (two) times daily.    Dispense:  60 tablet    Refill:  2     Follow-up: Return in about 2 weeks (around 11/13/2014), or if symptoms worsen or fail to improve.  Warren Stacks, M.D.  

## 2014-10-31 LAB — CMP14+EGFR
A/G RATIO: 2 (ref 1.1–2.5)
ALK PHOS: 53 IU/L (ref 39–117)
ALT: 16 IU/L (ref 0–32)
AST: 19 IU/L (ref 0–40)
Albumin: 4.3 g/dL (ref 3.5–5.5)
BILIRUBIN TOTAL: 0.3 mg/dL (ref 0.0–1.2)
BUN/Creatinine Ratio: 26 — ABNORMAL HIGH (ref 9–23)
BUN: 18 mg/dL (ref 6–24)
CHLORIDE: 101 mmol/L (ref 97–108)
CO2: 27 mmol/L (ref 18–29)
Calcium: 9.7 mg/dL (ref 8.7–10.2)
Creatinine, Ser: 0.69 mg/dL (ref 0.57–1.00)
GFR calc Af Amer: 113 mL/min/{1.73_m2} (ref >59)
GFR calc non Af Amer: 98 mL/min/{1.73_m2} (ref >59)
GLOBULIN, TOTAL: 2.2 g/dL (ref 1.5–4.5)
Glucose: 95 mg/dL (ref 65–99)
Potassium: 4.8 mmol/L (ref 3.5–5.2)
SODIUM: 143 mmol/L (ref 134–144)
TOTAL PROTEIN: 6.5 g/dL (ref 6.0–8.5)

## 2014-10-31 LAB — CBC WITH DIFFERENTIAL/PLATELET
BASOS: 0 %
Basophils Absolute: 0 10*3/uL (ref 0.0–0.2)
EOS (ABSOLUTE): 0.1 10*3/uL (ref 0.0–0.4)
Eos: 1 %
HEMOGLOBIN: 12.9 g/dL (ref 11.1–15.9)
Hematocrit: 39.1 % (ref 34.0–46.6)
IMMATURE GRANS (ABS): 0 10*3/uL (ref 0.0–0.1)
Immature Granulocytes: 0 %
LYMPHS: 21 %
Lymphocytes Absolute: 1.3 10*3/uL (ref 0.7–3.1)
MCH: 31.2 pg (ref 26.6–33.0)
MCHC: 33 g/dL (ref 31.5–35.7)
MCV: 95 fL (ref 79–97)
MONOCYTES: 7 %
Monocytes Absolute: 0.4 10*3/uL (ref 0.1–0.9)
Neutrophils Absolute: 4.4 10*3/uL (ref 1.4–7.0)
Neutrophils: 71 %
Platelets: 162 10*3/uL (ref 150–379)
RBC: 4.13 x10E6/uL (ref 3.77–5.28)
RDW: 13.5 % (ref 12.3–15.4)
WBC: 6.2 10*3/uL (ref 3.4–10.8)

## 2014-10-31 LAB — TSH: TSH: 1.78 u[IU]/mL (ref 0.450–4.500)

## 2014-10-31 LAB — CK TOTAL AND CKMB (NOT AT ARMC)
CK TOTAL: 48 U/L (ref 24–173)
CK-MB Index: 1.2 ng/mL (ref 0.0–5.3)

## 2014-10-31 LAB — TROPONIN T

## 2014-11-05 ENCOUNTER — Telehealth: Payer: Self-pay | Admitting: Family Medicine

## 2014-11-05 NOTE — Telephone Encounter (Signed)
Pt notified of results Verbalizes understanding 

## 2014-11-06 ENCOUNTER — Ambulatory Visit (HOSPITAL_COMMUNITY)
Admission: RE | Admit: 2014-11-06 | Discharge: 2014-11-06 | Disposition: A | Discharge status: Home / Self Care | Payer: BLUE CROSS/BLUE SHIELD | Source: Ambulatory Visit | Attending: Family Medicine | Admitting: Family Medicine

## 2014-11-06 ENCOUNTER — Other Ambulatory Visit: Payer: Self-pay | Admitting: Family Medicine

## 2014-11-06 DIAGNOSIS — R072 Precordial pain: Secondary | ICD-10-CM

## 2014-11-06 DIAGNOSIS — R1310 Dysphagia, unspecified: Secondary | ICD-10-CM

## 2014-11-06 DIAGNOSIS — R1013 Epigastric pain: Secondary | ICD-10-CM | POA: Diagnosis not present

## 2014-11-06 DIAGNOSIS — Z85820 Personal history of malignant melanoma of skin: Secondary | ICD-10-CM | POA: Insufficient documentation

## 2014-11-09 ENCOUNTER — Telehealth: Payer: Self-pay | Admitting: Family Medicine

## 2014-11-09 MED ORDER — ONDANSETRON 8 MG PO TBDP: 8.0000 mg | ORAL_TABLET | Freq: Four times a day (QID) | ORAL | Status: DC | PRN

## 2014-11-09 NOTE — Telephone Encounter (Signed)
Patients husband aware and he will have her call in the am to schedule an appointment.

## 2014-11-09 NOTE — Telephone Encounter (Signed)
I sent in a prescription for ondansetron. try to move her appointment up a few days

## 2014-11-19 ENCOUNTER — Ambulatory Visit (INDEPENDENT_AMBULATORY_CARE_PROVIDER_SITE_OTHER): Payer: BLUE CROSS/BLUE SHIELD | Admitting: Family Medicine

## 2014-11-19 VITALS — BP 123/86 | HR 82 | Temp 97.7°F | Ht 64.0 in | Wt 186.0 lb

## 2014-11-19 DIAGNOSIS — R1011 Right upper quadrant pain: Secondary | ICD-10-CM | POA: Diagnosis not present

## 2014-11-19 NOTE — Progress Notes (Signed)
Subjective:  Patient ID: [name], female    DOB: 02-21-1961  Age: 54 y.o. MRN: [id number]  CC: Pain   HPI ANAELI CORNWALL presents for continued right-sided pain in the right upper to mid abdominal section. This is described as moderate. 4-6/10 in intensity intermittently. She continues to have some sensation of food sticking but overall she is able to eat anything without pain or heartburn. The pain radiates to the right angle of the scapula area. She also notes right shoulder pain. No excessive belching or flatulence. No food intolerance. No frequency or dysuria or vaginal discharge. No nausea vomiting or diarrhea. Symptoms have been consistent since her last visit they are no better but no worse. They are intermittent but frequent enough to be a daily concern. Test results reviewed with patient showing essentially normal right upper quadrant ultrasound and barium swallow.  History Meaghan has a past medical history of PONV (postoperative nausea and vomiting); Headache(784.0); Arthritis; Left ureteral calculus; History of kidney stones; History of melanoma excision; History of parotidectomy; and History of palpitations.   She has past surgical history that includes Diagnostic laparoscopy (2010); Kidney surgery (at age 48); Melanoma excision with sentinel lymph node dissection (2001); Lumbar laminectomy/decompression microdiscectomy (05/18/2011); Cystoscopy with retrograde pyelogram, ureteroscopy and stent placement (Bilateral, 03/19/2013); RIGHT LATERAL PAROTIDECTOMY W/ NERVE DISSECTION / RIGHT MODIFIED RADICAL NECK DISSECTION SPARING SCM ELEVENTH NERVE AND INTERNAL JUGULAR VEIN (09-12-2006  DR DWIGHT BATES); Diagnostic laparoscopy (04-12-2009); Cardiovascular stress test (03-21-2012  DR Johnsie Cancel); Cardiac catheterization (2006 (APPROX)  [place]); Breast surgery (2005- x 2 times); Cystoscopy w/ ureteral stent placement (Left, 03/26/2013); and Cystoscopy with stent placement (Left, 03/26/2013).     Her family history includes COPD in her mother; Cancer in her father and mother; Dementia in her mother; Heart disease in her father; Hyperlipidemia in her father; Hypertension in her father and mother.She reports that she has never smoked. She has never used smokeless tobacco. She reports that she does not drink alcohol or use illicit drugs.  Outpatient Prescriptions Prior to Visit  Medication Sig Dispense Refill  . aspirin-acetaminophen-caffeine (EXCEDRIN MIGRAINE) 250-250-65 MG per tablet Take 2 tablets by mouth daily as needed for headache or migraine.     . ondansetron (ZOFRAN-ODT) 8 MG disintegrating tablet Take 1 tablet (8 mg total) by mouth every 6 (six) hours as needed for nausea or vomiting. 20 tablet 1  . Calcium Carbonate-Vitamin D (CALCIUM + D PO) Take 1 tablet by mouth daily.    Marland Kitchen loratadine (CLARITIN) 10 MG tablet Take 10 mg by mouth daily as needed for allergies.     . methocarbamol (ROBAXIN) 500 MG tablet Take 500 mg by mouth daily as needed for muscle spasms (associated with back pain).    Marland Kitchen metoCLOPramide (REGLAN) 5 MG tablet Take 1 tablet (5 mg total) by mouth 2 (two) times daily. (Patient not taking: Reported on 11/19/2014) 60 tablet 2  . Multiple Vitamin (MULTIVITAMIN WITH MINERALS) TABS tablet Take 1 tablet by mouth daily.     No facility-administered medications prior to visit.    ROS Review of Systems  Constitutional: Negative for fever, chills, diaphoresis, appetite change, fatigue and unexpected weight change.  HENT: Negative for congestion, ear pain, hearing loss, postnasal drip, rhinorrhea, sneezing, sore throat and trouble swallowing.   Eyes: Negative for pain.  Respiratory: Negative for cough, chest tightness and shortness of breath.   Cardiovascular: Negative for chest pain and palpitations.  Gastrointestinal: Positive for abdominal pain. Negative for nausea, vomiting, diarrhea,  constipation and blood in stool.  Genitourinary: Negative for dysuria, frequency  and menstrual problem.  Musculoskeletal: Negative for joint swelling and arthralgias.  Skin: Negative for rash.  Neurological: Negative for dizziness, weakness, numbness and headaches.  Psychiatric/Behavioral: Negative for dysphoric mood and agitation.    Objective:  BP 123/86 mmHg  Pulse 82  Temp(Src) 97.7 F (36.5 C) (Oral)  Ht 5\' 4"  (1.626 m)  Wt 186 lb (84.369 kg)  BMI 31.91 kg/m2  LMP 05/18/2011  BP Readings from Last 3 Encounters:  11/19/14 123/86  10/30/14 129/71  10/22/14 158/73    Wt Readings from Last 3 Encounters:  11/19/14 186 lb (84.369 kg)  10/30/14 180 lb (81.647 kg)  10/22/14 175 lb (79.379 kg)     Physical Exam  Constitutional: She is oriented to person, place, and time. She appears well-developed and well-nourished. No distress.  HENT:  Head: Normocephalic and atraumatic.  Right Ear: External ear normal.  Left Ear: External ear normal.  Nose: Nose normal.  Mouth/Throat: Oropharynx is clear and moist.  Eyes: Conjunctivae and EOM are normal. Pupils are equal, round, and reactive to light.  Neck: Normal range of motion. Neck supple. No thyromegaly present.  Cardiovascular: Normal rate, regular rhythm and normal heart sounds.   No murmur heard. Pulmonary/Chest: Effort normal and breath sounds normal. No respiratory distress. She has no wheezes. She has no rales.  Abdominal: Soft. Bowel sounds are normal. She exhibits no distension and no mass. There is tenderness (right upper quadrant with a equivocal to positive Murphy's sign). There is no rebound and no guarding.  Lymphadenopathy:    She has no cervical adenopathy.  Neurological: She is alert and oriented to person, place, and time. She has normal reflexes.  Skin: Skin is warm and dry.  Psychiatric: She has a normal mood and affect. Her behavior is normal. Judgment and thought content normal.    No results found for: HGBA1C  Vazquez Results  Component Value Date   WBC 6.2 10/30/2014   HGB 14.2  10/22/2014   HCT 39.1 10/30/2014   PLT 162 10/22/2014   GLUCOSE 95 10/30/2014   CHOL 204* 02/03/2013   TRIG 83 02/03/2013   HDL 53 02/03/2013   LDLCALC 134* 02/03/2013   ALT 16 10/30/2014   AST 19 10/30/2014   NA 143 10/30/2014   K 4.8 10/30/2014   CL 101 10/30/2014   CREATININE 0.69 10/30/2014   BUN 18 10/30/2014   CO2 27 10/30/2014   TSH 1.780 10/30/2014   INR 0.94 05/15/2011    Dg Ugi W/high Density W/kub  11/06/2014   CLINICAL DATA:  Dysphagia with solids for 1 month, epigastric pain, history of melanoma  EXAM: UPPER GI SERIES WITH KUB  TECHNIQUE: After obtaining a scout radiograph a routine upper GI series was performed using thin and high density barium. Patient also swallowed a 12.5 mm diameter barium tablet.  FLUOROSCOPY TIME:  Radiation Exposure Index (as provided by the fluoroscopic device): Not provided  If the device does not provide the exposure index:  Fluoroscopy Time (in minutes and seconds):  2 minutes 18 seconds  Number of Acquired Images: 15 plus multiple screen captures obtained during fluoroscopy  COMPARISON:  Abdominal radiograph 03/12/2013  FINDINGS: Increased stool throughout colon.  Multiple medication tablets project over proximal half of colon.  Additional tiny radiopacities within stool of descending colon.  Small bowel gas pattern normal.  Bones demineralized.  Normal esophageal distention and motility.  No esophageal mass or stricture.  12.5 mm  diameter barium tablet passed from oral cavity to stomach without obstruction.  Targeted rapid sequence imaging of cervical esophagus and hypopharynx showed no laryngeal penetration or aspiration.  Stomach distends normally with normal rugal fold pattern.  No gastric mass, ulceration, outlet obstruction or fold thickening.  No hiatal hernia or gastroesophageal reflux seen during exam.  Duodenal bulb and sweep normal appearance.  Ligament of Treitz normal position.  Jejunal loops unremarkable.  IMPRESSION: Normal upper GI  exam.  Increased stool throughout colon on scout image.   Electronically Signed   By: Lavonia Dana M.D.   On: 11/06/2014 12:17   US Abdomen Limited Ruq  11/06/2014   CLINICAL DATA:  Precordial pain, dysphagia  EXAM: US ABDOMEN LIMITED - RIGHT UPPER QUADRANT  COMPARISON:  None  FINDINGS: Gallbladder:  Normally distended without stones or wall thickening.  No pericholecystic fluid or sonographic Murphy sign.  Common bile duct:  Diameter: Normal caliber 4 mm diameter  Liver:  Normal appearance  No RIGHT upper quadrant free fluid.  IMPRESSION: Normal exam.   Electronically Signed   By: Lavonia Dana M.D.   On: 11/06/2014 11:23    Assessment & Plan:   Surya was seen today for pain.  Diagnoses and all orders for this visit:  RUQ abdominal pain Orders: -     CT Abdomen Pelvis W Contrast; Future -     NM HEPATOBILIARY INCLUDING GB; Future  I have discontinued Ms. Santillano's loratadine, methocarbamol, multivitamin with minerals, Calcium Carbonate-Vitamin D (CALCIUM + D PO), and metoCLOPramide. I am also having her maintain her aspirin-acetaminophen-caffeine, ondansetron, polyethylene glycol powder, and ranitidine.  Meds ordered this encounter  Medications  . polyethylene glycol powder (GLYCOLAX/MIRALAX) powder    Sig: Take 0.5 Containers by mouth 2 (two) times daily.   . ranitidine (ZANTAC) 150 MG tablet    Sig: Take 150 mg by mouth 2 (two) times daily.      Follow-up: Return in about 6 weeks (around 12/31/2014).  Claretta Fraise, M.D.

## 2014-11-23 ENCOUNTER — Telehealth: Payer: Self-pay | Admitting: Family Medicine

## 2014-11-26 ENCOUNTER — Ambulatory Visit (HOSPITAL_COMMUNITY)
Admission: RE | Admit: 2014-11-26 | Discharge: 2014-11-26 | Disposition: A | Discharge status: Home / Self Care | Payer: BLUE CROSS/BLUE SHIELD | Source: Ambulatory Visit | Attending: Family Medicine | Admitting: Family Medicine

## 2014-11-26 ENCOUNTER — Encounter (HOSPITAL_COMMUNITY): Payer: Self-pay

## 2014-11-26 DIAGNOSIS — R1011 Right upper quadrant pain: Secondary | ICD-10-CM | POA: Diagnosis present

## 2014-11-26 MED ORDER — TECHNETIUM TC 99M MEBROFENIN IV KIT
5.0000 | PACK | Freq: Once | INTRAVENOUS | Status: AC | PRN
  Administered 2014-11-26: 5.2 via INTRAVENOUS

## 2014-11-26 MED ORDER — SODIUM CHLORIDE 0.9 % IJ SOLN
INTRAMUSCULAR | Status: AC
  Filled 2014-11-26: qty 18

## 2014-11-26 MED ORDER — SINCALIDE 5 MCG IJ SOLR
INTRAMUSCULAR | Status: AC
  Administered 2014-11-26: 1.64 ug via INTRAVENOUS
  Filled 2014-11-26: qty 5

## 2014-11-26 MED ORDER — STERILE WATER FOR INJECTION IJ SOLN
INTRAMUSCULAR | Status: AC
  Administered 2014-11-26: 1.64 mL via INTRAVENOUS
  Filled 2014-11-26: qty 10

## 2014-11-27 ENCOUNTER — Telehealth: Payer: Self-pay | Admitting: Family Medicine

## 2014-11-27 ENCOUNTER — Telehealth: Payer: Self-pay | Admitting: *Deleted

## 2014-11-27 NOTE — Telephone Encounter (Deleted)
Still no notes - on suggestion from Dr Livia Snellen - that is what she needs.

## 2014-11-27 NOTE — Telephone Encounter (Signed)
Per note in imaging - proceed with CT abd. Will message Debbi and carlon to arrange

## 2014-11-27 NOTE — Telephone Encounter (Signed)
-----   Message from Claretta Fraise, MD sent at 11/26/2014  1:03 PM EDT ----- [name], Your gallbladder function is slightly lower than normal. It is not low enough to recommend surgery because I'm concerned that if we did surgery it wouldn't clear her symptoms anyway. If her symptoms continue let me know and we will have to continue to look for a cause. Best Regards, Claretta Fraise, M.D.

## 2014-11-30 ENCOUNTER — Other Ambulatory Visit: Payer: Self-pay

## 2014-11-30 DIAGNOSIS — G8929 Other chronic pain: Secondary | ICD-10-CM

## 2014-11-30 DIAGNOSIS — R1011 Right upper quadrant pain: Principal | ICD-10-CM

## 2014-12-03 ENCOUNTER — Ambulatory Visit (HOSPITAL_COMMUNITY)
Admission: RE | Admit: 2014-12-03 | Discharge: 2014-12-03 | Disposition: A | Discharge status: Home / Self Care | Payer: BLUE CROSS/BLUE SHIELD | Source: Ambulatory Visit | Attending: Family Medicine | Admitting: Family Medicine

## 2014-12-03 DIAGNOSIS — Z85820 Personal history of malignant melanoma of skin: Secondary | ICD-10-CM | POA: Diagnosis not present

## 2014-12-03 DIAGNOSIS — G8929 Other chronic pain: Secondary | ICD-10-CM

## 2014-12-03 DIAGNOSIS — R1011 Right upper quadrant pain: Secondary | ICD-10-CM | POA: Insufficient documentation

## 2014-12-03 DIAGNOSIS — I700 Atherosclerosis of aorta: Secondary | ICD-10-CM | POA: Insufficient documentation

## 2014-12-03 MED ORDER — IOHEXOL 300 MG/ML  SOLN
100.0000 mL | Freq: Once | INTRAMUSCULAR | Status: AC | PRN
  Administered 2014-12-03: 100 mL via INTRAVENOUS

## 2014-12-03 NOTE — Telephone Encounter (Signed)
-----   Message from Claretta Fraise, MD sent at 12/03/2014 10:13 AM EDT ----- [name],    Your CT abdomen result is normal.Some minor variations that are not significant are commonly marked, but do not represent any medical problem for you.  Best regards, Claretta Fraise, M.D.

## 2014-12-04 ENCOUNTER — Telehealth: Payer: Self-pay | Admitting: Cardiovascular Disease

## 2014-12-04 NOTE — Telephone Encounter (Signed)
C/o sick for 2 months.  Has been to the ER twice, GI once in the last month.  Has had many test including GB , hyda scan and CT scan and it shows GB slow.  Has pain middle of chest when ever she eats up into right shouder Has been under care of PCP. For symptoms test and results Pain very bad yesterday after eating too much.  Has made an appointment with Dr. Johnsie Cancel for August for a heart check up and will not accept seeing anyone else to help her be evaluated sooner. Advised her that if her pain continues, she becomes short of breath, sweaty, nauseated she needs to go to the ED for evaluation.  Says she doesn't want to  to the ED but will if pain gets worse or doesn't go away. Will follow up with PCP also.

## 2014-12-04 NOTE — Telephone Encounter (Signed)
Pt c/o of Chest Pain: STAT if CP now or developed within 24 hours  1. Are you having CP right now? A little  2. Are you experiencing any other symptoms (ex. SOB, nausea, vomiting, sweating)? Nausea  3. How long have you been experiencing CP? Off and on for 37months  4. Is your CP continuous or coming and going? Coming and going   5. Have you taken Nitroglycerin? Yes but she takes her husbands pills ?

## 2014-12-10 ENCOUNTER — Encounter: Payer: Self-pay | Admitting: Family Medicine

## 2014-12-10 ENCOUNTER — Ambulatory Visit (INDEPENDENT_AMBULATORY_CARE_PROVIDER_SITE_OTHER): Payer: BLUE CROSS/BLUE SHIELD | Admitting: Family Medicine

## 2014-12-10 VITALS — BP 137/82 | HR 81 | Temp 98.2°F | Wt 184.6 lb

## 2014-12-10 DIAGNOSIS — J0100 Acute maxillary sinusitis, unspecified: Secondary | ICD-10-CM | POA: Diagnosis not present

## 2014-12-10 DIAGNOSIS — R1011 Right upper quadrant pain: Secondary | ICD-10-CM | POA: Diagnosis not present

## 2014-12-10 MED ORDER — LINACLOTIDE 145 MCG PO CAPS: 145.0000 ug | ORAL_CAPSULE | Freq: Every day | ORAL | Status: DC

## 2014-12-10 MED ORDER — DICYCLOMINE HCL 10 MG PO CAPS: 10.0000 mg | ORAL_CAPSULE | Freq: Three times a day (TID) | ORAL | Status: DC

## 2014-12-10 NOTE — Progress Notes (Signed)
Subjective:  Patient ID: [name], female    DOB: [birth year]  Age: 54 y.o. MRN: [id number]  CC: Sore Throat; Cough; and skin irritation   HPI [name] presents for continued right upper quadrant abdominal pain. She does have a surgical referral pending for evaluation of a somewhat decreased gallbladder emptying time on her hepatobiliary scan. The pain is a constant dull ache with intermittent cramping. It is not diffuse it stays in the same area just under the ribs on the right in the mid clavicular to anterior axillary line. This is along the costal margin. She has associated nausea. There is occasional vomiting. Some relief with ondansetron but not complete. She states that the MiraLAX however makes her have diarrhea. She cannot tolerate it.  She is also having upper respiratory symptoms including swelling in the right cheek congestion right cheek pain sore throat and drainage. Fever chills or sweats. Mild cough. Onset 4 days ago. Seems to be worsening slightly. Overall stable. Symptoms are mild to moderate in severity.  History [name] has a past medical history of PONV (postoperative nausea and vomiting); Headache(784.0); Arthritis; Left ureteral calculus; History of kidney stones; History of melanoma excision; History of parotidectomy; History of palpitations; Cancer; and Asthma.   She has past surgical history that includes Diagnostic laparoscopy (2010); Kidney surgery (at age 29); Melanoma excision with sentinel lymph node dissection (2001); Lumbar laminectomy/decompression microdiscectomy (05/18/2011); Cystoscopy with retrograde pyelogram, ureteroscopy and stent placement (Bilateral, 03/19/2013); RIGHT LATERAL PAROTIDECTOMY W/ NERVE DISSECTION / RIGHT MODIFIED RADICAL NECK DISSECTION SPARING SCM ELEVENTH NERVE AND INTERNAL JUGULAR VEIN (09-12-2006  DR DWIGHT BATES); Diagnostic laparoscopy (04-12-2009); Cardiovascular stress test (03-21-2012  DR Johnsie Cancel); Cardiac catheterization  (2006 (APPROX)  [place]); Breast surgery (2005- x 2 times); Cystoscopy w/ ureteral stent placement (Left, 03/26/2013); and Cystoscopy with stent placement (Left, 03/26/2013).   Her family history includes COPD in her mother; Cancer in her father and mother; Dementia in her mother; Heart disease in her father; Hyperlipidemia in her father; Hypertension in her father and mother.She reports that she has never smoked. She has never used smokeless tobacco. She reports that she drinks alcohol. She reports that she does not use illicit drugs.  Outpatient Prescriptions Prior to Visit  Medication Sig Dispense Refill  . aspirin-acetaminophen-caffeine (EXCEDRIN MIGRAINE) 250-250-65 MG per tablet Take 2 tablets by mouth daily as needed for headache or migraine.     . ondansetron (ZOFRAN-ODT) 8 MG disintegrating tablet Take 1 tablet (8 mg total) by mouth every 6 (six) hours as needed for nausea or vomiting. 20 tablet 1  . polyethylene glycol powder (GLYCOLAX/MIRALAX) powder Take 0.5 Containers by mouth 2 (two) times daily.     . ranitidine (ZANTAC) 150 MG tablet Take 150 mg by mouth 2 (two) times daily.      No facility-administered medications prior to visit.    ROS Review of Systems  Constitutional: Negative for fever, chills, diaphoresis, appetite change, fatigue and unexpected weight change.  HENT: Positive for congestion, facial swelling, postnasal drip and sore throat. Negative for ear pain, hearing loss, rhinorrhea, sneezing and trouble swallowing.   Eyes: Negative for pain.  Respiratory: Positive for cough (dry). Negative for chest tightness and shortness of breath.   Cardiovascular: Negative for chest pain and palpitations.  Gastrointestinal: Positive for nausea, abdominal pain, diarrhea, constipation and abdominal distention. Negative for vomiting, blood in stool and anal bleeding.  Genitourinary: Negative for dysuria, frequency and menstrual problem.  Musculoskeletal: Negative for joint  swelling and arthralgias.  Skin: Negative for rash.  Neurological: Negative for dizziness, weakness, numbness and headaches.  Psychiatric/Behavioral: Negative for dysphoric mood and agitation.    Objective:  BP 137/82 mmHg  Pulse 81  Temp(Src) 98.2 F (36.8 C) (Oral)  Wt 184 lb 9.6 oz (83.734 kg)  LMP 05/18/2011  BP Readings from Last 3 Encounters:  12/10/14 137/82  11/19/14 123/86  10/30/14 129/71    Wt Readings from Last 3 Encounters:  12/10/14 184 lb 9.6 oz (83.734 kg)  11/19/14 186 lb (84.369 kg)  10/30/14 180 lb (81.647 kg)     Physical Exam  Constitutional: She is oriented to person, place, and time. She appears well-developed and well-nourished. No distress.  HENT:  Head: Normocephalic and atraumatic.  Right Ear: External ear normal.  Left Ear: External ear normal.  Nose: Nose normal.  Mouth/Throat: Oropharynx is clear and moist.  Eyes: Conjunctivae and EOM are normal. Pupils are equal, round, and reactive to light.  Neck: Normal range of motion. Neck supple. No thyromegaly present.  Cardiovascular: Normal rate, regular rhythm and normal heart sounds.   No murmur heard. Pulmonary/Chest: Effort normal and breath sounds normal. No respiratory distress. She has no wheezes. She has no rales.  Abdominal: Soft. Bowel sounds are normal. She exhibits no distension and no mass. There is tenderness (mild left upper quadrant tenderness.). There is no rebound and no guarding.  Lymphadenopathy:    She has no cervical adenopathy.  Neurological: She is alert and oriented to person, place, and time. She has normal reflexes.  Skin: Skin is warm and dry.  Psychiatric: She has a normal mood and affect. Her behavior is normal. Judgment and thought content normal.    No results found for: HGBA1C  Vazquez Results  Component Value Date   WBC 6.2 10/30/2014   HGB 14.2 10/22/2014   HCT 39.1 10/30/2014   PLT 162 10/22/2014   GLUCOSE 95 10/30/2014   CHOL 204* 02/03/2013   TRIG 83  02/03/2013   HDL 53 02/03/2013   LDLCALC 134* 02/03/2013   ALT 16 10/30/2014   AST 19 10/30/2014   NA 143 10/30/2014   K 4.8 10/30/2014   CL 101 10/30/2014   CREATININE 0.69 10/30/2014   BUN 18 10/30/2014   CO2 27 10/30/2014   TSH 1.780 10/30/2014   INR 0.94 05/15/2011    Ct Abdomen W Contrast  12/03/2014   CLINICAL DATA:  Right upper quadrant burning and pain for 2 months. History parotid gland surgery. History of melanoma.  EXAM: CT ABDOMEN WITH CONTRAST  TECHNIQUE: Multidetector CT imaging of the abdomen was performed using the standard protocol following bolus administration of intravenous contrast.  CONTRAST:  180mL OMNIPAQUE IOHEXOL 300 MG/ML  SOLN  COMPARISON:  Multiple exams, including 03/08/2013 hand 11/26/2014  FINDINGS: Lower chest: Nodular scarring in the lingula, no change from 09/11/2006.  Hepatobiliary: Unremarkable  Pancreas: Unremarkable  Spleen: Unremarkable  Adrenals/Urinary Tract: 7 mm cyst in the right kidney lower pole, no change from 2008. 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  Stomach/Bowel: Prominent stool in visualized colon.  Vascular/Lymphatic: Mild aortoiliac atherosclerotic vascular calcification.  Other: No supplemental non-categorized findings.  Musculoskeletal: Unremarkable  IMPRESSION: 1. Prominent stool in the visualize:-constipation not excluded. 2.  Aortoiliac atherosclerotic vascular disease.   Electronically Signed   By: Van Clines M.D.   On: 12/03/2014 10:07    Assessment & Plan:   [name] was seen today for sore throat, cough and skin irritation.  Diagnoses and  all orders for this visit:  RUQ abdominal pain  Acute maxillary sinusitis, recurrence not specified  Other orders -     dicyclomine (BENTYL) 10 MG capsule; Take 1 capsule (10 mg total) by mouth 4 (four) times daily -  before meals and at bedtime. -     Linaclotide (LINZESS) 145 MCG CAPS capsule; Take 1 capsule (145 mcg total) by mouth  daily. For bowel management and pain  I am having [name] start on dicyclomine and Linaclotide. I am also having her maintain her aspirin-acetaminophen-caffeine, ondansetron, polyethylene glycol powder, and ranitidine.  Meds ordered this encounter  Medications  . dicyclomine (BENTYL) 10 MG capsule    Sig: Take 1 capsule (10 mg total) by mouth 4 (four) times daily -  before meals and at bedtime.    Dispense:  120 capsule    Refill:  5  . Linaclotide (LINZESS) 145 MCG CAPS capsule    Sig: Take 1 capsule (145 mcg total) by mouth daily. For bowel management and pain    Dispense:  30 capsule    Refill:  5   Hepatobiliary scan reviewed showing mildly diminished gallbladder emptying. We discussed surgical consult versus a trial of NSAIDs and dicyclomine. She is willing to have surgical consult but wants to give the medication a trial before making a decision about cholecystectomy Follow-up: Return in about 6 weeks (around 01/21/2015).  Claretta Fraise, M.D.

## 2014-12-12 ENCOUNTER — Telehealth: Payer: Self-pay | Admitting: Family Medicine

## 2014-12-12 NOTE — Telephone Encounter (Signed)
Pt stated that she was supposed to have something called in for cough, sorethroat, ect.  Please address

## 2014-12-13 MED ORDER — LEVOFLOXACIN 500 MG PO TABS: 500.0000 mg | ORAL_TABLET | Freq: Every day | ORAL | Status: DC

## 2014-12-13 NOTE — Telephone Encounter (Signed)
Med sent.

## 2014-12-28 ENCOUNTER — Ambulatory Visit (INDEPENDENT_AMBULATORY_CARE_PROVIDER_SITE_OTHER): Payer: BLUE CROSS/BLUE SHIELD | Admitting: Family Medicine

## 2014-12-28 ENCOUNTER — Encounter: Payer: Self-pay | Admitting: Family Medicine

## 2014-12-28 VITALS — BP 173/92 | HR 92 | Ht 64.0 in

## 2014-12-28 DIAGNOSIS — F411 Generalized anxiety disorder: Secondary | ICD-10-CM | POA: Diagnosis not present

## 2014-12-28 DIAGNOSIS — R002 Palpitations: Secondary | ICD-10-CM | POA: Diagnosis not present

## 2014-12-28 DIAGNOSIS — R079 Chest pain, unspecified: Secondary | ICD-10-CM

## 2014-12-28 MED ORDER — CLONAZEPAM 0.125 MG PO TBDP: 0.1250 mg | ORAL_TABLET | Freq: Two times a day (BID) | ORAL | Status: DC | PRN

## 2014-12-28 NOTE — Progress Notes (Signed)
Subjective:  Patient ID: [name], female    DOB: 1960-10-16  Age: 54 y.o. MRN: [id number]  CC: Chest Pain   HPI [name] presents for acute onset of substernal chest pain without radiation that occurred after becoming very upset. She says that her boss was very mean to her. He said some terrible things. She is thinking about leaving her employment or transferring. She is in tears when discussing the event. Shortly after she developed substernal aching sensation without pressure. There is no radiation. There is a palpitation. She feels that her heart rate is increased and her blood pressure is up and this is due to her being very anxious and upset. The pain is moderate in intensity. It is improving. It started about an hour ago during the episode with her boss. She immediately came here. By the time of evaluation it was diminishing.  History [name] has a past medical history of PONV (postoperative nausea and vomiting); Headache(784.0); Arthritis; Left ureteral calculus; History of kidney stones; History of melanoma excision; History of parotidectomy; History of palpitations; Cancer; and Asthma.   She has past surgical history that includes Diagnostic laparoscopy (2010); Kidney surgery (at age 30); Melanoma excision with sentinel lymph node dissection (2001); Lumbar laminectomy/decompression microdiscectomy (05/18/2011); Cystoscopy with retrograde pyelogram, ureteroscopy and stent placement (Bilateral, 03/19/2013); RIGHT LATERAL PAROTIDECTOMY W/ NERVE DISSECTION / RIGHT MODIFIED RADICAL NECK DISSECTION SPARING SCM ELEVENTH NERVE AND INTERNAL JUGULAR VEIN (09-12-2006  DR DWIGHT BATES); Diagnostic laparoscopy (04-12-2009); Cardiovascular stress test (03-21-2012  DR Johnsie Cancel); Cardiac catheterization (2006 (APPROX)  [place]); Breast surgery (2005- x 2 times); Cystoscopy w/ ureteral stent placement (Left, 03/26/2013); and Cystoscopy with stent placement (Left, 03/26/2013).   Her family  history includes COPD in her mother; Cancer in her father and mother; Dementia in her mother; Heart disease in her father; Hyperlipidemia in her father; Hypertension in her father and mother.She reports that she has never smoked. She has never used smokeless tobacco. She reports that she drinks alcohol. She reports that she does not use illicit drugs.  Outpatient Prescriptions Prior to Visit  Medication Sig Dispense Refill  . aspirin-acetaminophen-caffeine (EXCEDRIN MIGRAINE) 250-250-65 MG per tablet Take 2 tablets by mouth daily as needed for headache or migraine.     . dicyclomine (BENTYL) 10 MG capsule Take 1 capsule (10 mg total) by mouth 4 (four) times daily -  before meals and at bedtime. 120 capsule 5  . Linaclotide (LINZESS) 145 MCG CAPS capsule Take 1 capsule (145 mcg total) by mouth daily. For bowel management and pain 30 capsule 5  . ondansetron (ZOFRAN-ODT) 8 MG disintegrating tablet Take 1 tablet (8 mg total) by mouth every 6 (six) hours as needed for nausea or vomiting. 20 tablet 1  . polyethylene glycol powder (GLYCOLAX/MIRALAX) powder Take 0.5 Containers by mouth 2 (two) times daily.     . ranitidine (ZANTAC) 150 MG tablet Take 150 mg by mouth 2 (two) times daily.     Marland Kitchen levofloxacin (LEVAQUIN) 500 MG tablet Take 1 tablet (500 mg total) by mouth daily. 10 tablet 0   No facility-administered medications prior to visit.    ROS Review of Systems  Constitutional: Negative for fever, chills, diaphoresis, appetite change, fatigue and unexpected weight change.  HENT: Negative for congestion, ear pain, hearing loss, postnasal drip, rhinorrhea, sneezing, sore throat and trouble swallowing.   Eyes: Negative for pain.  Respiratory: Positive for chest tightness. Negative for cough and shortness of breath.   Cardiovascular: Positive for chest  pain and palpitations. Negative for leg swelling.  Gastrointestinal: Negative for nausea, vomiting, abdominal pain, diarrhea and constipation.    Genitourinary: Negative for dysuria, frequency and menstrual problem.  Musculoskeletal: Negative for joint swelling and arthralgias.  Skin: Negative for rash.  Neurological: Negative for dizziness, weakness, numbness and headaches.  Psychiatric/Behavioral: Positive for dysphoric mood, decreased concentration and agitation. Negative for suicidal ideas and confusion. The patient is nervous/anxious.     Objective:  BP 173/92 mmHg  Pulse 92  Ht $R'5\' 4"'zU$  (1.626 m)  LMP 05/18/2011  BP Readings from Last 3 Encounters:  12/28/14 173/92  12/10/14 137/82  11/19/14 123/86    Wt Readings from Last 3 Encounters:  12/10/14 184 lb 9.6 oz (83.734 kg)  11/19/14 186 lb (84.369 kg)  10/30/14 180 lb (81.647 kg)     Physical Exam  Constitutional: She is oriented to person, place, and time. She appears well-developed and well-nourished. She appears distressed (distraught).  HENT:  Head: Normocephalic and atraumatic.  Right Ear: External ear normal.  Left Ear: External ear normal.  Nose: Nose normal.  Mouth/Throat: Oropharynx is clear and moist.  Eyes: Conjunctivae and EOM are normal. Pupils are equal, round, and reactive to light.  Neck: Normal range of motion. Neck supple. No thyromegaly present.  Cardiovascular: Normal rate, regular rhythm and normal heart sounds.   No murmur heard. Pulmonary/Chest: Effort normal and breath sounds normal. No respiratory distress. She has no wheezes. She has no rales.  Abdominal: Soft. Bowel sounds are normal. She exhibits no distension. There is no tenderness.  Lymphadenopathy:    She has no cervical adenopathy.  Neurological: She is alert and oriented to person, place, and time. She has normal reflexes.  Skin: Skin is warm and dry.  Psychiatric:  Tearful and distraught    No results found for: HGBA1C  Vazquez Results  Component Value Date   WBC 6.2 10/30/2014   HGB 14.2 10/22/2014   HCT 39.1 10/30/2014   PLT 162 10/22/2014   GLUCOSE 95 10/30/2014    CHOL 204* 02/03/2013   TRIG 83 02/03/2013   HDL 53 02/03/2013   LDLCALC 134* 02/03/2013   ALT 16 10/30/2014   AST 19 10/30/2014   NA 143 10/30/2014   K 4.8 10/30/2014   CL 101 10/30/2014   CREATININE 0.69 10/30/2014   BUN 18 10/30/2014   CO2 27 10/30/2014   TSH 1.780 10/30/2014   INR 0.94 05/15/2011    Ct Abdomen W Contrast  12/03/2014   CLINICAL DATA:  Right upper quadrant burning and pain for 2 months. History parotid gland surgery. History of melanoma.  EXAM: CT ABDOMEN WITH CONTRAST  TECHNIQUE: Multidetector CT imaging of the abdomen was performed using the standard protocol following bolus administration of intravenous contrast.  CONTRAST:  156mL OMNIPAQUE IOHEXOL 300 MG/ML  SOLN  COMPARISON:  Multiple exams, including 03/08/2013 hand 11/26/2014  FINDINGS: Lower chest: Nodular scarring in the lingula, no change from 09/11/2006.  Hepatobiliary: Unremarkable  Pancreas: Unremarkable  Spleen: Unremarkable  Adrenals/Urinary Tract: 7 mm cyst in the right kidney lower pole, no change from 2008. 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  Stomach/Bowel: Prominent stool in visualized colon.  Vascular/Lymphatic: Mild aortoiliac atherosclerotic vascular calcification.  Other: No supplemental non-categorized findings.  Musculoskeletal: Unremarkable  IMPRESSION: 1. Prominent stool in the visualize:-constipation not excluded. 2.  Aortoiliac atherosclerotic vascular disease.   Electronically Signed   By: Van Clines M.D.   On: 12/03/2014 10:07    Assessment &  Plan:   Duane was seen today for chest pain.  Diagnoses and all orders for this visit:  Chest pain, unspecified chest pain type Orders: -     EKG 12-Lead -     Cancel: POCT CBC -     Troponin I -     CMP14+EGFR -     CBC with Differential/Platelet  Palpitations  Anxiety state  Other orders -     clonazepam (KLONOPIN) 0.125 MG disintegrating tablet; Take 1 tablet (0.125 mg total) by mouth  2 (two) times daily as needed (nerves).   I have discontinued Ms. Pruss's levofloxacin. I am also having her start on clonazepam. Additionally, I am having her maintain her aspirin-acetaminophen-caffeine, ondansetron, polyethylene glycol powder, ranitidine, dicyclomine, and Linaclotide.  Meds ordered this encounter  Medications  . clonazepam (KLONOPIN) 0.125 MG disintegrating tablet    Sig: Take 1 tablet (0.125 mg total) by mouth 2 (two) times daily as needed (nerves).    Dispense:  20 tablet    Refill:  0   EKG LAFB, NSR, no change from 10/30/2014  Follow-up: Return in about 2 weeks (around 01/11/2015), or if symptoms worsen or fail to improve.  Claretta Fraise, M.D.

## 2014-12-29 ENCOUNTER — Emergency Department (HOSPITAL_COMMUNITY): Payer: BLUE CROSS/BLUE SHIELD

## 2014-12-29 ENCOUNTER — Inpatient Hospital Stay (HOSPITAL_COMMUNITY)
Admission: EM | Admit: 2014-12-29 | Discharge: 2014-12-31 | DRG: 281 | Disposition: A | Discharge status: Home / Self Care | Payer: BLUE CROSS/BLUE SHIELD | Attending: Cardiology | Admitting: Cardiology

## 2014-12-29 ENCOUNTER — Encounter (HOSPITAL_COMMUNITY): Payer: Self-pay | Admitting: *Deleted

## 2014-12-29 ENCOUNTER — Telehealth: Payer: Self-pay | Admitting: Cardiovascular Disease

## 2014-12-29 DIAGNOSIS — Z85820 Personal history of malignant melanoma of skin: Secondary | ICD-10-CM | POA: Diagnosis not present

## 2014-12-29 DIAGNOSIS — E785 Hyperlipidemia, unspecified: Secondary | ICD-10-CM | POA: Diagnosis present

## 2014-12-29 DIAGNOSIS — I5181 Takotsubo syndrome: Secondary | ICD-10-CM | POA: Diagnosis present

## 2014-12-29 DIAGNOSIS — I9589 Other hypotension: Secondary | ICD-10-CM | POA: Diagnosis present

## 2014-12-29 DIAGNOSIS — I252 Old myocardial infarction: Secondary | ICD-10-CM

## 2014-12-29 DIAGNOSIS — I10 Essential (primary) hypertension: Secondary | ICD-10-CM | POA: Diagnosis present

## 2014-12-29 DIAGNOSIS — I214 Non-ST elevation (NSTEMI) myocardial infarction: Principal | ICD-10-CM

## 2014-12-29 DIAGNOSIS — R079 Chest pain, unspecified: Secondary | ICD-10-CM | POA: Diagnosis present

## 2014-12-29 DIAGNOSIS — K219 Gastro-esophageal reflux disease without esophagitis: Secondary | ICD-10-CM | POA: Diagnosis not present

## 2014-12-29 HISTORY — DX: Unspecified asthma, uncomplicated: J45.909

## 2014-12-29 HISTORY — DX: Malignant neoplasm of parotid gland: C07

## 2014-12-29 HISTORY — DX: Depression, unspecified: F32.A

## 2014-12-29 HISTORY — DX: Major depressive disorder, single episode, unspecified: F32.9

## 2014-12-29 HISTORY — DX: Old myocardial infarction: I25.2

## 2014-12-29 HISTORY — DX: Pure hypercholesterolemia, unspecified: E78.00

## 2014-12-29 HISTORY — DX: Cyst of kidney, acquired: N28.1

## 2014-12-29 HISTORY — DX: Malignant melanoma of other parts of face: C43.39

## 2014-12-29 HISTORY — DX: Gastro-esophageal reflux disease without esophagitis: K21.9

## 2014-12-29 HISTORY — DX: Takotsubo syndrome: I51.81

## 2014-12-29 HISTORY — DX: Non-ST elevation (NSTEMI) myocardial infarction: I21.4

## 2014-12-29 HISTORY — DX: Headache: R51

## 2014-12-29 HISTORY — DX: Anxiety disorder, unspecified: F41.9

## 2014-12-29 LAB — CMP14+EGFR
ALT: 9 IU/L (ref 0–32)
AST: 18 IU/L (ref 0–40)
Albumin/Globulin Ratio: 1.7 (ref 1.1–2.5)
Albumin: 4.3 g/dL (ref 3.5–5.5)
Alkaline Phosphatase: 64 IU/L (ref 39–117)
BUN/Creatinine Ratio: 19 (ref 9–23)
BUN: 12 mg/dL (ref 6–24)
Bilirubin Total: <0.2 mg/dL (ref 0.0–1.2)
CALCIUM: 9.6 mg/dL (ref 8.7–10.2)
CO2: 25 mmol/L (ref 18–29)
CREATININE: 0.64 mg/dL (ref 0.57–1.00)
Chloride: 105 mmol/L (ref 97–108)
GFR calc Af Amer: 115 mL/min/{1.73_m2} (ref >59)
GFR, EST NON AFRICAN AMERICAN: 100 mL/min/{1.73_m2} (ref >59)
Globulin, Total: 2.6 g/dL (ref 1.5–4.5)
Glucose: 96 mg/dL (ref 65–99)
POTASSIUM: 4.6 mmol/L (ref 3.5–5.2)
Sodium: 146 mmol/L — ABNORMAL HIGH (ref 134–144)
TOTAL PROTEIN: 6.9 g/dL (ref 6.0–8.5)

## 2014-12-29 LAB — HEPATIC FUNCTION PANEL
ALBUMIN: 4.3 g/dL (ref 3.5–5.0)
ALT: 15 U/L (ref 14–54)
AST: 25 U/L (ref 15–41)
Alkaline Phosphatase: 59 U/L (ref 38–126)
Bilirubin, Direct: <0.1 mg/dL — ABNORMAL LOW (ref 0.1–0.5)
TOTAL PROTEIN: 7.4 g/dL (ref 6.5–8.1)
Total Bilirubin: 0.5 mg/dL (ref 0.3–1.2)

## 2014-12-29 LAB — CBC WITH DIFFERENTIAL/PLATELET
BASOS ABS: 0 10*3/uL (ref 0.0–0.1)
BASOS PCT: 0 % (ref 0–1)
Basophils Absolute: 0 10*3/uL (ref 0.0–0.2)
Basophils Absolute: 0 10*3/uL (ref 0.0–0.1)
Basophils Relative: 0 % (ref 0–1)
Basos: 1 %
EOS (ABSOLUTE): 0.2 10*3/uL (ref 0.0–0.4)
EOS: 4 %
Eosinophils Absolute: 0.1 10*3/uL (ref 0.0–0.7)
Eosinophils Absolute: 0.1 10*3/uL (ref 0.0–0.7)
Eosinophils Relative: 2 % (ref 0–5)
Eosinophils Relative: 3 % (ref 0–5)
HCT: 38.9 % (ref 36.0–46.0)
HCT: 35.8 % — ABNORMAL LOW (ref 36.0–46.0)
HEMOGLOBIN: 12.5 g/dL (ref 12.0–15.0)
Hematocrit: 37.2 % (ref 34.0–46.6)
Hemoglobin: 12.8 g/dL (ref 11.1–15.9)
Hemoglobin: 13.4 g/dL (ref 12.0–15.0)
Immature Grans (Abs): 0 10*3/uL (ref 0.0–0.1)
Immature Granulocytes: 0 %
LYMPHS ABS: 1.2 10*3/uL (ref 0.7–3.1)
LYMPHS ABS: 1.3 10*3/uL (ref 0.7–4.0)
Lymphocytes Relative: 31 % (ref 12–46)
Lymphocytes Relative: 39 % (ref 12–46)
Lymphs Abs: 1.6 10*3/uL (ref 0.7–4.0)
Lymphs: 29 %
MCH: 31 pg (ref 26.6–33.0)
MCH: 31 pg (ref 26.0–34.0)
MCH: 31.2 pg (ref 26.0–34.0)
MCHC: 34.4 g/dL (ref 31.5–35.7)
MCHC: 34.4 g/dL (ref 30.0–36.0)
MCHC: 34.9 g/dL (ref 30.0–36.0)
MCV: 90 fL (ref 79–97)
MCV: 90 fL (ref 78.0–100.0)
MCV: 89.3 fL (ref 78.0–100.0)
MONO ABS: 0.3 10*3/uL (ref 0.1–1.0)
MONOS PCT: 8 % (ref 3–12)
Monocytes Absolute: 0.4 10*3/uL (ref 0.1–0.9)
Monocytes Absolute: 0.4 10*3/uL (ref 0.1–1.0)
Monocytes Relative: 10 % (ref 3–12)
Monocytes: 9 %
NEUTROS ABS: 2.3 10*3/uL (ref 1.7–7.7)
NEUTROS PCT: 50 % (ref 43–77)
Neutro Abs: 1.9 10*3/uL (ref 1.7–7.7)
Neutrophils Absolute: 2.4 10*3/uL (ref 1.4–7.0)
Neutrophils Relative %: 57 % (ref 43–77)
Neutrophils: 57 %
Platelets: 156 10*3/uL (ref 150–379)
Platelets: 149 10*3/uL — ABNORMAL LOW (ref 150–400)
Platelets: 136 10*3/uL — ABNORMAL LOW (ref 150–400)
RBC: 4.13 x10E6/uL (ref 3.77–5.28)
RBC: 4.32 MIL/uL (ref 3.87–5.11)
RBC: 4.01 MIL/uL (ref 3.87–5.11)
RDW: 14 % (ref 12.3–15.4)
RDW: 12.7 % (ref 11.5–15.5)
RDW: 12.9 % (ref 11.5–15.5)
WBC: 4.2 10*3/uL (ref 3.4–10.8)
WBC: 4.1 10*3/uL (ref 4.0–10.5)
WBC: 4 10*3/uL (ref 4.0–10.5)

## 2014-12-29 LAB — BASIC METABOLIC PANEL
Anion gap: 9 (ref 5–15)
Anion gap: 7 (ref 5–15)
BUN: 15 mg/dL (ref 6–20)
BUN: 11 mg/dL (ref 6–20)
CALCIUM: 9.1 mg/dL (ref 8.9–10.3)
CO2: 27 mmol/L (ref 22–32)
CO2: 27 mmol/L (ref 22–32)
Calcium: 8.9 mg/dL (ref 8.9–10.3)
Chloride: 104 mmol/L (ref 101–111)
Chloride: 107 mmol/L (ref 101–111)
Creatinine, Ser: 0.69 mg/dL (ref 0.44–1.00)
Creatinine, Ser: 0.78 mg/dL (ref 0.44–1.00)
GFR calc Af Amer: >60 mL/min (ref >60)
GFR calc non Af Amer: >60 mL/min (ref >60)
Glucose, Bld: 91 mg/dL (ref 65–99)
Glucose, Bld: 122 mg/dL — ABNORMAL HIGH (ref 65–99)
POTASSIUM: 3.8 mmol/L (ref 3.5–5.1)
POTASSIUM: 3.8 mmol/L (ref 3.5–5.1)
Sodium: 140 mmol/L (ref 135–145)
Sodium: 141 mmol/L (ref 135–145)

## 2014-12-29 LAB — MAGNESIUM: MAGNESIUM: 2.2 mg/dL (ref 1.7–2.4)

## 2014-12-29 LAB — PROTIME-INR
INR: 1.01 (ref 0.00–1.49)
INR: 1.02 (ref 0.00–1.49)
Prothrombin Time: 13.5 seconds (ref 11.6–15.2)
Prothrombin Time: 13.6 seconds (ref 11.6–15.2)

## 2014-12-29 LAB — LIPASE, BLOOD: Lipase: 21 U/L — ABNORMAL LOW (ref 22–51)

## 2014-12-29 LAB — TROPONIN I
Troponin I: 0.1 ng/mL (ref 0.00–0.04)
Troponin I: 0.34 ng/mL — ABNORMAL HIGH (ref <0.031)
Troponin I: 0.25 ng/mL — ABNORMAL HIGH (ref <0.031)

## 2014-12-29 LAB — BRAIN NATRIURETIC PEPTIDE: B Natriuretic Peptide: 620.8 pg/mL — ABNORMAL HIGH (ref 0.0–100.0)

## 2014-12-29 MED ORDER — HEPARIN BOLUS VIA INFUSION
4000.0000 [IU] | Freq: Once | INTRAVENOUS | Status: AC
  Administered 2014-12-29: 4000 [IU] via INTRAVENOUS

## 2014-12-29 MED ORDER — METOPROLOL TARTRATE 12.5 MG HALF TABLET
12.5000 mg | ORAL_TABLET | Freq: Two times a day (BID) | ORAL | Status: DC
  Administered 2014-12-29 – 2014-12-30 (×2): 12.5 mg via ORAL
  Filled 2014-12-29 (×3): qty 1

## 2014-12-29 MED ORDER — NITROGLYCERIN 0.4 MG SL SUBL
SUBLINGUAL_TABLET | SUBLINGUAL | Status: AC
  Filled 2014-12-29: qty 1

## 2014-12-29 MED ORDER — SODIUM CHLORIDE 0.9 % IJ SOLN
3.0000 mL | Freq: Two times a day (BID) | INTRAMUSCULAR | Status: DC
  Administered 2014-12-29 – 2014-12-30 (×2): 3 mL via INTRAVENOUS

## 2014-12-29 MED ORDER — ACETAMINOPHEN 325 MG PO TABS
650.0000 mg | ORAL_TABLET | ORAL | Status: DC | PRN
  Administered 2014-12-30 – 2014-12-31 (×4): 650 mg via ORAL
  Filled 2014-12-29 (×6): qty 2

## 2014-12-29 MED ORDER — SODIUM CHLORIDE 0.9 % IV SOLN
INTRAVENOUS | Status: DC
  Administered 2014-12-29 – 2014-12-31 (×4): via INTRAVENOUS

## 2014-12-29 MED ORDER — ONDANSETRON HCL 4 MG/2ML IJ SOLN: 4.0000 mg | Freq: Four times a day (QID) | INTRAMUSCULAR | Status: DC | PRN

## 2014-12-29 MED ORDER — NITROGLYCERIN 0.4 MG SL SUBL: 0.4000 mg | SUBLINGUAL_TABLET | SUBLINGUAL | Status: DC | PRN

## 2014-12-29 MED ORDER — SODIUM CHLORIDE 0.9 % IV BOLUS (SEPSIS)
500.0000 mL | Freq: Once | INTRAVENOUS | Status: AC
  Administered 2014-12-29: 500 mL via INTRAVENOUS

## 2014-12-29 MED ORDER — NITROGLYCERIN 0.4 MG SL SUBL
0.4000 mg | SUBLINGUAL_TABLET | Freq: Once | SUBLINGUAL | Status: AC
  Administered 2014-12-29: 0.4 mg via SUBLINGUAL

## 2014-12-29 MED ORDER — ATORVASTATIN CALCIUM 80 MG PO TABS
80.0000 mg | ORAL_TABLET | Freq: Every day | ORAL | Status: DC
  Administered 2014-12-29: 80 mg via ORAL
  Filled 2014-12-29: qty 1

## 2014-12-29 MED ORDER — HEPARIN (PORCINE) IN NACL 100-0.45 UNIT/ML-% IJ SOLN
1100.0000 [IU]/h | INTRAMUSCULAR | Status: DC
  Administered 2014-12-29: 900 [IU]/h via INTRAVENOUS
  Filled 2014-12-29: qty 250

## 2014-12-29 MED ORDER — SODIUM CHLORIDE 0.9 % IJ SOLN: 3.0000 mL | INTRAMUSCULAR | Status: DC | PRN

## 2014-12-29 MED ORDER — ASPIRIN EC 81 MG PO TBEC
81.0000 mg | DELAYED_RELEASE_TABLET | Freq: Every day | ORAL | Status: DC
  Administered 2014-12-31: 81 mg via ORAL
  Filled 2014-12-29 (×2): qty 1

## 2014-12-29 MED ORDER — SODIUM CHLORIDE 0.9 % IV SOLN: 250.0000 mL | INTRAVENOUS | Status: DC | PRN

## 2014-12-29 NOTE — Telephone Encounter (Signed)
Routed to Dr. Johnsie Cancel

## 2014-12-29 NOTE — ED Provider Notes (Signed)
CSN: [id number]     Arrival date & time 12/29/14  1435 History   First MD Initiated Contact with Patient 12/29/14 1451     Chief Complaint  Patient presents with  . Chest Pain      HPI Pt was seen at 1455. Per pt, c/o gradual onset and resolution of one episode of "pounding chest pain" yesterday. Pt states she was "arguing with a co-worker" approximately 70 yesterday when her symptoms began. Symptoms included: CP, nausea, diaphoresis. Pt states she was evaluated by her PMD yesterday, had labs drawn, and "given something to calm my nerves." Pt states her symptoms lasted approximately 2 hours yesterday before resolving. Pt states today she "feels better" and "just has my usual chest pain." Pt describes this CP as "my usual for the past 1 to 2 months." Pt states "I had a bunch of tests by the ER and my family doctor and they say it's my gallbladder."  Pt describes this CP as "aching." Denies SOB/cough, no abd pain, no vomiting/diarrhea, no back pain, no fevers. Pt was called by her PMD today and told to "come to the ER and see a Cardiologist because my heart test was elevated."    Past Medical History  Diagnosis Date  . PONV (postoperative nausea and vomiting)   . Headache(784.0)   . Arthritis     back   . Left ureteral calculus   . History of kidney stones   . History of melanoma excision     2001--  RIGHT SUPRAORBITAL (RIGHT FOREHEAD AND UPPER EYELID ----  S/P MOHS PROCDURE W/ SLN BX----   NO RECURRENCE  . History of parotidectomy     09-12-2006---   MYOEPITHIOMA  OF PAROTID SALVERY GLAND --  X35  RADIATION TX  COMPLETED AUGUST 2008--   NO RECURRENCE  . History of palpitations     STRESS INDUCED  . Asthma     Childhood  . Cancer     Melanoma, Parotid gland   Past Surgical History  Procedure Laterality Date  . Diagnostic laparoscopy  2010  . Kidney surgery  at age 29    [place]  . Melanoma excision with sentinel lymph node biopsy  2001    moh's  procedure/  RIGHT FOREHEAD AND UPPER EYELID  . Lumbar laminectomy/decompression microdiscectomy  05/18/2011    Procedure: LUMBAR LAMINECTOMY/DECOMPRESSION MICRODISCECTOMY;  Surgeon: Johnn Hai;  Location: [facility];  Service: Orthopedics;  Laterality: Right;  Decompression Lumbar 4-Lumbar 5  Right    (xray)   . Cystoscopy with retrograde pyelogram, ureteroscopy and stent placement Bilateral 03/19/2013    Procedure: CYSTOSCOPY WITH RETROGRADE PYELOGRAM, BILATERAL URETEROSCOPY AND STENT PLACEMENT LEFT URETER,BILATERAL STONE EXTRACTION , HOLMIUM LASER LEFT URETER;  Surgeon: Molli Hazard, MD;  Location: [facility];  Service: Urology;  Laterality: Bilateral;  . Right lateral parotidectomy w/ nerve dissection / right modified radical neck dissection sparing scm eleventh nerve and internal jugular vein  09-12-2006  DR DWIGHT BATES  . Diagnostic laparoscopy  04-12-2009  . Cardiovascular stress test  03-21-2012  DR [facility]    NORMAL NUCLEAR STUDY/  NO ISCHEMIA/  EF 63%  . Cardiac catheterization  2006 ([place]PROX)  [place]    NORMAL  . Breast surgery  2005- x 2 times    benign breast biopsies-bilateral  . Cystoscopy w/ ureteral stent placement Left 03/26/2013    Procedure: CYSTOSCOPY WITH RETROGRADE PYELOGRAM ;  Surgeon: Molli Hazard, MD;  Location: [facility];  Service:  Urology;  Laterality: Left;  . Cystoscopy with stent placement Left 03/26/2013    Procedure: CYSTOSCOPY WITH STENT PLACEMENT;  Surgeon: Molli Hazard, MD;  Location: [facility];  Service: Urology;  Laterality: Left;   Family History  Problem Relation Age of Onset  . Hypertension Mother   . COPD Mother   . Cancer Mother     breast  . Dementia Mother   . Heart disease Father   . Cancer Father     Colorectal  . Hyperlipidemia Father   . Hypertension Father    History  Substance Use Topics  . Smoking status: Never Smoker   . Smokeless tobacco: Never Used  . Alcohol Use: Yes      Comment: occasionally    Review of Systems ROS: Statement: All systems negative except as marked or noted in the HPI; Constitutional: Negative for fever and chills. ; ; Eyes: Negative for eye pain, redness and discharge. ; ; ENMT: Negative for ear pain, hoarseness, nasal congestion, sinus pressure and sore throat. ; ; Cardiovascular: +CP, diaphoresis. Negative for palpitations, dyspnea and peripheral edema. ; ; Respiratory: Negative for cough, wheezing and stridor. ; ; Gastrointestinal: +nausea. Negative for vomiting, diarrhea, abdominal pain, blood in stool, hematemesis, jaundice and rectal bleeding. . ; ; Genitourinary: Negative for dysuria, flank pain and hematuria. ; ; Musculoskeletal: Negative for back pain and neck pain. Negative for swelling and trauma.; ; Skin: Negative for pruritus, rash, abrasions, blisters, bruising and skin lesion.; ; Neuro: Negative for headache, lightheadedness and neck stiffness. Negative for weakness, altered level of consciousness , altered mental status, extremity weakness, paresthesias, involuntary movement, seizure and syncope.      Allergies  Cephalexin  Home Medications   Prior to Admission medications   Medication Sig Start Date End Date Taking? Authorizing Provider  aspirin-acetaminophen-caffeine (EXCEDRIN MIGRAINE) [phone] MG per tablet Take 2 tablets by mouth daily as needed for headache or migraine.    Yes Historical Provider, MD  clonazepam (KLONOPIN) 0.125 MG disintegrating tablet Take 1 tablet (0.125 mg total) by mouth 2 (two) times daily as needed (nerves). 12/28/14  Yes Claretta Fraise, MD  ketotifen (THERA TEARS ALLERGY) 0.025 % ophthalmic solution Apply 1 drop to eye daily as needed (for dry eye relief).   Yes Historical Provider, MD  ondansetron (ZOFRAN-ODT) 8 MG disintegrating tablet Take 1 tablet (8 mg total) by mouth every 6 (six) hours as needed for nausea or vomiting. 11/09/14  Yes Claretta Fraise, MD  dicyclomine (BENTYL) 10 MG capsule  Take 1 capsule (10 mg total) by mouth 4 (four) times daily -  before meals and at bedtime. Patient not taking: Reported on 12/29/2014 12/10/14   Claretta Fraise, MD  levofloxacin (LEVAQUIN) 500 MG tablet Take 500 mg by mouth daily. 10 day course stating on 12/13/2014 COMPLETED 12/13/14   Historical Provider, MD  Linaclotide Rolan Lipa) 145 MCG CAPS capsule Take 1 capsule (145 mcg total) by mouth daily. For bowel management and pain Patient not taking: Reported on 12/29/2014 12/10/14   Claretta Fraise, MD   BP 94/59 mmHg  Pulse 80  Resp 20  SpO2 95%  LMP 05/18/2011   Filed Vitals:   12/29/14 1506 12/29/14 1517  BP: 84/54 94/59  Pulse:  80  Resp:  20  SpO2:  95%     Physical Exam  1500: Physical examination:  Nursing notes reviewed; Vital signs and O2 SAT reviewed;  Constitutional: Well developed, Well nourished, Well hydrated, In no acute distress; Head:  Normocephalic, atraumatic;  Eyes: EOMI, PERRL, No scleral icterus; ENMT: Mouth and pharynx normal, Mucous membranes moist; Neck: Supple, Full range of motion, No lymphadenopathy; Cardiovascular: Regular rate and rhythm, No gallop; Respiratory: Breath sounds clear & equal bilaterally, No wheezes.  Speaking full sentences with ease, Normal respiratory effort/excursion; Chest: Nontender, Movement normal; Abdomen: Soft, Nontender, Nondistended, Normal bowel sounds; Genitourinary: No CVA tenderness; Extremities: Pulses normal, No tenderness, No edema, No calf edema or asymmetry.; Neuro: AA&Ox3, Major CN grossly intact.  Speech clear. No gross focal motor or sensory deficits in extremities.; Skin: Color normal, Warm, Dry.    ED Course  Procedures     EKG Interpretation   Date/Time:  Tuesday December 29 2014 14:58:03 EDT Ventricular Rate:  73 PR Interval:  146 QRS Duration: 84 QT Interval:  438 QTC Calculation: 482 R Axis:   -59 Text Interpretation:  Normal sinus rhythm Left axis deviation Left  anterior fascicular block Anterior infarct , age  undetermined ST \\T \ T  wave abnormality, consider inferolateral ischemia When compared with ECG  of 10/22/2014 ST-t wave abnormality Inferolateral leads is now Present  Confirmed by [facility]  MD, Nunzio Cory [phone]) on 12/29/2014 3:06:24 PM      MDM  MDM Reviewed: previous chart, nursing note and vitals Reviewed previous: labs and ECG Interpretation: labs, ECG and x-ray Total time providing critical care: 30-74 minutes. This excludes time spent performing separately reportable procedures and services. Consults: cardiology   CRITICAL CARE Performed by: Alfonzo Feller Total critical care time: 35 Critical care time was exclusive of separately billable procedures and treating other patients. Critical care was necessary to treat or prevent imminent or life-threatening deterioration. Critical care was time spent personally by me on the following activities: development of treatment plan with patient and/or surrogate as well as nursing, discussions with consultants, evaluation of patient's response to treatment, examination of patient, obtaining history from patient or surrogate, ordering and performing treatments and interventions, ordering and review of laboratory studies, ordering and review of radiographic studies, pulse oximetry and re-evaluation of patient's condition.   Results for orders placed or performed during the hospital encounter of 12/29/14  CBC with Differential  Result Value Ref Range   WBC 4.1 4.0 - 10.5 K/uL   RBC 4.32 3.87 - 5.11 MIL/uL   Hemoglobin 13.4 12.0 - 15.0 g/dL   HCT 38.9 36.0 - 46.0 %   MCV 90.0 78.0 - 100.0 fL   MCH 31.0 26.0 - 34.0 pg   MCHC 34.4 30.0 - 36.0 g/dL   RDW 12.7 11.5 - 15.5 %   Platelets 149 (L) 150 - 400 K/uL   Neutrophils Relative % 57 43 - 77 %   Neutro Abs 2.3 1.7 - 7.7 K/uL   Lymphocytes Relative 31 12 - 46 %   Lymphs Abs 1.3 0.7 - 4.0 K/uL   Monocytes Relative 10 3 - 12 %   Monocytes Absolute 0.4 0.1 - 1.0 K/uL   Eosinophils Relative  2 0 - 5 %   Eosinophils Absolute 0.1 0.0 - 0.7 K/uL   Basophils Relative 0 0 - 1 %   Basophils Absolute 0.0 0.0 - 0.1 K/uL  Troponin I  Result Value Ref Range   Troponin I 0.34 (H) <0.031 ng/mL  Basic metabolic panel  Result Value Ref Range   Sodium 140 135 - 145 mmol/L   Potassium 3.8 3.5 - 5.1 mmol/L   Chloride 104 101 - 111 mmol/L   CO2 27 22 - 32 mmol/L   Glucose, Bld 91 65 -  99 mg/dL   BUN 15 6 - 20 mg/dL   Creatinine, Ser 0.69 0.44 - 1.00 mg/dL   Calcium 9.1 8.9 - 10.3 mg/dL   GFR calc non Af Amer >60 >60 mL/min   GFR calc Af Amer >60 >60 mL/min   Anion gap 9 5 - 15  Lipase, blood  Result Value Ref Range   Lipase 21 (L) 22 - 51 U/L  Hepatic function panel  Result Value Ref Range   Total Protein 7.4 6.5 - 8.1 g/dL   Albumin 4.3 3.5 - 5.0 g/dL   AST 25 15 - 41 U/L   ALT 15 14 - 54 U/L   Alkaline Phosphatase 59 38 - 126 U/L   Total Bilirubin 0.5 0.3 - 1.2 mg/dL   Bilirubin, Direct <0.1 (L) 0.1 - 0.5 mg/dL   Indirect Bilirubin NOT CALCULATED 0.3 - 0.9 mg/dL   Dg Chest Port 1 View 12/29/2014   CLINICAL DATA:  54 year old female with chest pain. History of melanoma 2001 post excision. Subsequent encounter.  EXAM: PORTABLE CHEST - 1 VIEW  COMPARISON:  10/22/2014 chest x-ray.  FINDINGS: No infiltrate, congestive heart failure or pneumothorax.  No plain film evidence pulmonary malignancy.  Remote right clavicle fracture.  Central pulmonary vascular prominence stable.  Heart size within normal limits.  IMPRESSION: No acute abnormality.  Please see above.   Electronically Signed   By: Genia Del M.D.   On: 12/29/2014 15:08    1600:  Pt already took full strength ASA this morning; given SL ntg with improvement in her symptoms. Troponin elevated from yesterday; will dose IV heparin. Dx and testing d/w pt and family.  Questions answered.  Verb understanding, agreeable to transfer to [facility] for admit. T/C to [facility] Dr. Harl Bowie, case discussed, including:  HPI, pertinent PM/SHx, VS/PE,  dx testing, ED course and treatment:  Agreeable to come to ED to facilitate transfer to [facility] for admit.    Francine Graven, DO 12/31/14 1253

## 2014-12-29 NOTE — ED Notes (Signed)
Patient reports chest pain yesterday at work after getting upset with coworker, went to PCP, was called back today and told she had a positive troponin and to come to ER. Pt. Reports discomfort right now and feels it is related to her gallbladder.

## 2014-12-29 NOTE — Consult Note (Signed)
Primary cardiologist: Dr Jenkins Rouge Consulting cardiologist: Dr Carlyle Dolly  Clinical Summary [name] is a 54 y.o.female history of HTN admitted with chest pain. From Dr Kyla Balzarine notes history of prior chest pain with negative cath in [facility] 9 years ago, negative myoview 03/2012. This episode of chest pain started yesterday afternoon around 430pm. She became very upset with her boss, and suddenly felt chest pain, palpitaitons, and SOB. She reporte the pain has been constant since that time, though some variability on severity. Pain is not positional. She has had recent issues with her esophagus, and has had intermittent pains she associates with both as well. Current chest pain improved with NG in ER.   K 4.6, Cr 0.64, Trop 0.34, Hgb 12.8, Plt 156,  EKG diffuse TWIs CXR no acute process  Allergies  Allergen Reactions  . Cephalexin Shortness Of Breath and Rash    Medications Scheduled Medications:     Infusions: . sodium chloride    . sodium chloride       PRN Medications:     Past Medical History  Diagnosis Date  . PONV (postoperative nausea and vomiting)   . Headache(784.0)   . Arthritis     back   . Left ureteral calculus   . History of kidney stones   . History of melanoma excision     2001--  RIGHT SUPRAORBITAL (RIGHT FOREHEAD AND UPPER EYELID ----  S/P MOHS PROCDURE W/ SLN BX----   NO RECURRENCE  . History of parotidectomy     09-12-2006---   MYOEPITHIOMA  OF PAROTID SALVERY GLAND --  X35  RADIATION TX  COMPLETED AUGUST 2008--   NO RECURRENCE  . History of palpitations     STRESS INDUCED  . Asthma     Childhood  . Cancer     Melanoma, Parotid gland    Past Surgical History  Procedure Laterality Date  . Diagnostic laparoscopy  2010  . Kidney surgery  at age 89    [place]  . Melanoma excision with sentinel lymph node biopsy  2001    moh's procedure/  RIGHT FOREHEAD AND UPPER EYELID  . Lumbar  laminectomy/decompression microdiscectomy  05/18/2011    Procedure: LUMBAR LAMINECTOMY/DECOMPRESSION MICRODISCECTOMY;  Surgeon: Johnn Hai;  Location: [facility];  Service: Orthopedics;  Laterality: Right;  Decompression Lumbar 4-Lumbar 5  Right    (xray)   . Cystoscopy with retrograde pyelogram, ureteroscopy and stent placement Bilateral 03/19/2013    Procedure: CYSTOSCOPY WITH RETROGRADE PYELOGRAM, BILATERAL URETEROSCOPY AND STENT PLACEMENT LEFT URETER,BILATERAL STONE EXTRACTION , HOLMIUM LASER LEFT URETER;  Surgeon: Molli Hazard, MD;  Location: [facility];  Service: Urology;  Laterality: Bilateral;  . Right lateral parotidectomy w/ nerve dissection / right modified radical neck dissection sparing scm eleventh nerve and internal jugular vein  09-12-2006  DR DWIGHT BATES  . Diagnostic laparoscopy  04-12-2009  . Cardiovascular stress test  03-21-2012  DR [facility]    NORMAL NUCLEAR STUDY/  NO ISCHEMIA/  EF 63%  . Cardiac catheterization  2006 (APPROX)  [place]    NORMAL  . Breast surgery  2005- x 2 times    benign breast biopsies-bilateral  . Cystoscopy w/ ureteral stent placement Left 03/26/2013    Procedure: CYSTOSCOPY WITH RETROGRADE PYELOGRAM ;  Surgeon: Molli Hazard, MD;  Location: [facility];  Service: Urology;  Laterality: Left;  . Cystoscopy with stent placement Left 03/26/2013    Procedure: CYSTOSCOPY WITH STENT PLACEMENT;  Surgeon:  Molli Hazard, MD;  Location: [facility];  Service: Urology;  Laterality: Left;    Family History  Problem Relation Age of Onset  . Hypertension Mother   . COPD Mother   . Cancer Mother     breast  . Dementia Mother   . Heart disease Father   . Cancer Father     Colorectal  . Hyperlipidemia Father   . Hypertension Father     Social History [name] reports that she has never smoked. She has never used smokeless tobacco. [name] reports that she drinks alcohol.  Review of  Systems CONSTITUTIONAL: No weight loss, fever, chills, weakness or fatigue.  HEENT: Eyes: No visual loss, blurred vision, double vision or yellow sclerae. No hearing loss, sneezing, congestion, runny nose or sore throat.  SKIN: No rash or itching.  CARDIOVASCULAR: per HPI RESPIRATORY: No shortness of breath, cough or sputum.  GASTROINTESTINAL: No anorexia, nausea, vomiting or diarrhea. No abdominal pain or blood.  GENITOURINARY: no polyuria, no dysuria NEUROLOGICAL: No headache, dizziness, syncope, paralysis, ataxia, numbness or tingling in the extremities. No change in bowel or bladder control.  MUSCULOSKELETAL: No muscle, back pain, joint pain or stiffness.  HEMATOLOGIC: No anemia, bleeding or bruising.  LYMPHATICS: No enlarged nodes. No history of splenectomy.  PSYCHIATRIC: No history of depression or anxiety.      Physical Examination Blood pressure 104/64, pulse 77, resp. rate 20, last menstrual period 05/18/2011, SpO2 95 %. No intake or output data in the 24 hours ending 12/29/14 1613  HEENT: sclera clear  Cardiovascular: RRR, no m/r/g,no JVD  Respiratory: CTAB  GI: abdomen soft, NT, ND  MSK: no LE edema  Neuro: no focal deficits  Psych: appropriate affect   Lab Results  Basic Metabolic Panel:  Recent Labs Lab 12/28/14 1648 12/29/14 1452  NA 146* 140  K 4.6 3.8  CL 105 104  CO2 25 27  GLUCOSE 96 91  BUN 12 15  CREATININE 0.64 0.69  CALCIUM 9.6 9.1    Liver Function Tests:  Recent Labs Lab 12/28/14 1648 12/29/14 1452  AST 18 25  ALT 9 15  ALKPHOS 64 59  BILITOT <0.2 0.5  PROT 6.9 7.4  ALBUMIN  --  4.3    CBC:  Recent Labs Lab 12/28/14 1702 12/29/14 1452  WBC 4.2 4.1  NEUTROABS 2.4 2.3  HGB  --  13.4  HCT 37.2 38.9  MCV  --  90.0  PLT  --  149*    Cardiac Enzymes:  Recent Labs Lab 12/28/14 1648 12/29/14 1452  TROPONINI 0.10* 0.34*    BNP: Invalid input(s): POCBNP     Impression/Recommendations 1. NSTEMI - chest  pain symptoms are somewhat mixed and atypical, however significant troponin and EKG changes - history of chronic TWI anterior precordial leads, EKG today with more diffuse TWIs. Troponin elevated to 0.34 - she is currently pain free and hemodynamically stable - she has received ASA at home today, started on hep gtt in ER. Will plan for transfer to [facility] today with plans for cath tomorrow. She will need high dose statin, and beta blocker tonight if blood pressures ok. After NG she did get transiently hypotensive, bp has normalized with IVF. Likely ACE-I after cath.     Carlyle Dolly, M.D.

## 2014-12-29 NOTE — Progress Notes (Signed)
ANTICOAGULATION CONSULT NOTE - Initial Consult  Pharmacy Consult for Heparin Indication: chest pain/ACS  Allergies  Allergen Reactions  . Cephalexin Shortness Of Breath and Rash    Patient Measurements:  Patient weight 83.7 kg IBW  54.7 kg Heparin Dosing Weight: 73 kg  Vital Signs: BP: 94/56 mmHg (07/12 1545) Pulse Rate: 80 (07/12 1517)  Labs:  Recent Labs  12/28/14 1648 12/28/14 1702 12/29/14 1452  HGB  --   --  13.4  HCT  --  37.2 38.9  PLT  --   --  149*  CREATININE 0.64  --  0.69  TROPONINI 0.10*  --  0.34*    CrCl cannot be calculated (Unknown ideal weight.).   Medical History: Past Medical History  Diagnosis Date  . PONV (postoperative nausea and vomiting)   . Headache(784.0)   . Arthritis     back   . Left ureteral calculus   . History of kidney stones   . History of melanoma excision     2001--  RIGHT SUPRAORBITAL (RIGHT FOREHEAD AND UPPER EYELID ----  S/P MOHS PROCDURE W/ SLN BX----   NO RECURRENCE  . History of parotidectomy     09-12-2006---   MYOEPITHIOMA  OF PAROTID SALVERY GLAND --  X35  RADIATION TX  COMPLETED AUGUST 2008--   NO RECURRENCE  . History of palpitations     STRESS INDUCED  . Asthma     Childhood  . Cancer     Melanoma, Parotid gland    Medications:   (Not in a hospital admission)  Assessment: 54 yo female ED patient, chest pain and elevated troponin PTA medications reviewed. No anticoagulants listed Labs reviewed ED MD note not available at this time. Patient to be transferred to [facility]  Goal of Therapy:  Heparin level 0.3-0.7 units/ml Monitor platelets by anticoagulation protocol: Yes   Plan:  Give 4000 units bolus x 1 Start heparin infusion at 900 units/hr Check anti-Xa level in 6 hours and daily while on heparin Continue to monitor H&H and platelets  Abner Greenspan, Tayveon Lombardo Bennett 12/29/2014,4:07 PM

## 2014-12-29 NOTE — Telephone Encounter (Signed)
New message     Pt c/o of Chest Pain: STAT if CP now or developed within 24 hours  1. Are you having CP right now? Discomfort now  2. Are you experiencing any other symptoms (ex. SOB, nausea, vomiting, sweating)? nausea 3. How long have you been experiencing CP?  Yesterday about 4:30  4. Is your CP continuous or coming and going? continuous 5. Have you taken Nitroglycerin? No----but pt took a nerve pill Yesterday pt went to PCP and they called her now to give her her troponin level.  It is .10---they want her to go to the ER now

## 2014-12-29 NOTE — Telephone Encounter (Signed)
Received call  Instructed patient that she needs to go to the ED for further evaluation and treatment for her chest pain and nausea and elevated troponin level.  Told patient that this needs to be evaluated by the skills of the emergency department and of the hospital cardiologist.  Told her where she needs to be now is at the hospital because there are no interventions that we can do in the office that will improve her condition  States that she only likes Dr. Acie Fredrickson and that is the only one she likes to see.  Told her where she needs to be is at the ED for evaluation of her chest pain and elevated troponin  Said, I only go to the [facility], I told her that was fine they would be able to evaluate and treat her chest pain.

## 2014-12-29 NOTE — Progress Notes (Signed)
Brief X-Cover/Accept Note  Primary Cardiologist: Dr. Johnsie Cancel  [name] has had intermittent chest pain since yesterday and eventually went to the ER where she was found to have elevated troponin at [facility]. She was seen by Dr. Harl Bowie and started on heparin gtt. She is currently chest pain free, received aspirin at home earlier today (500 mg in total) and has plans for cardiac catheterization in AM. Vitals reviewed; BP 131/79 mmHg  Pulse 82  Temp(Src) 97.7 F (36.5 C) (Oral)  Resp 20  SpO2 99%  LMP 05/18/2011, heart RRR, lungs fairly CTAB, good radial and femoral pulses, extremities without edema, warm. Labs reviewed; orders placed. NPO after MN. I confirmed the plan with her which has already been discussed. She follows with Dr. Johnsie Cancel and would like for him to be updated.   Jules Husbands, MD

## 2014-12-29 NOTE — ED Notes (Signed)
Report given to [facility], ETA of 15 minutes

## 2014-12-29 NOTE — Telephone Encounter (Signed)
This patient belongs to Dr. Johnsie Cancel

## 2014-12-30 ENCOUNTER — Encounter (HOSPITAL_COMMUNITY): Payer: Self-pay | Admitting: General Practice

## 2014-12-30 ENCOUNTER — Encounter (HOSPITAL_COMMUNITY)
Admission: EM | Disposition: A | Discharge status: Home / Self Care | Payer: BLUE CROSS/BLUE SHIELD | Source: Home / Self Care | Attending: Cardiology

## 2014-12-30 DIAGNOSIS — I214 Non-ST elevation (NSTEMI) myocardial infarction: Secondary | ICD-10-CM

## 2014-12-30 HISTORY — PX: CARDIAC CATHETERIZATION: SHX172

## 2014-12-30 LAB — CBC
HEMATOCRIT: 33.8 % — AB (ref 36.0–46.0)
Hemoglobin: 11.7 g/dL — ABNORMAL LOW (ref 12.0–15.0)
MCH: 30.9 pg (ref 26.0–34.0)
MCHC: 34.6 g/dL (ref 30.0–36.0)
MCV: 89.2 fL (ref 78.0–100.0)
PLATELETS: 123 10*3/uL — AB (ref 150–400)
RBC: 3.79 MIL/uL — ABNORMAL LOW (ref 3.87–5.11)
RDW: 12.8 % (ref 11.5–15.5)
WBC: 3.3 10*3/uL — ABNORMAL LOW (ref 4.0–10.5)

## 2014-12-30 LAB — LIPID PANEL
Cholesterol: 272 mg/dL — ABNORMAL HIGH (ref 0–200)
HDL: 49 mg/dL (ref >40)
LDL Cholesterol: 202 mg/dL — ABNORMAL HIGH (ref 0–99)
Total CHOL/HDL Ratio: 5.6 RATIO
Triglycerides: 106 mg/dL (ref <150)
VLDL: 21 mg/dL (ref 0–40)

## 2014-12-30 LAB — PROTIME-INR
INR: 1.1 (ref 0.00–1.49)
Prothrombin Time: 14.4 seconds (ref 11.6–15.2)

## 2014-12-30 LAB — HEPARIN LEVEL (UNFRACTIONATED)
Heparin Unfractionated: 0.37 IU/mL (ref 0.30–0.70)
Heparin Unfractionated: 0.19 IU/mL — ABNORMAL LOW (ref 0.30–0.70)

## 2014-12-30 LAB — TROPONIN I
Troponin I: 0.16 ng/mL — ABNORMAL HIGH (ref <0.031)
Troponin I: 0.09 ng/mL — ABNORMAL HIGH (ref <0.031)

## 2014-12-30 LAB — TSH: TSH: 2.387 u[IU]/mL (ref 0.350–4.500)

## 2014-12-30 SURGERY — LEFT HEART CATH AND CORONARY ANGIOGRAPHY
Anesthesia: LOCAL

## 2014-12-30 MED ORDER — SODIUM CHLORIDE 0.9 % IJ SOLN: 3.0000 mL | Freq: Two times a day (BID) | INTRAMUSCULAR | Status: DC

## 2014-12-30 MED ORDER — FENTANYL CITRATE (PF) 100 MCG/2ML IJ SOLN
INTRAMUSCULAR | Status: DC | PRN
  Administered 2014-12-30: 25 ug via INTRAVENOUS

## 2014-12-30 MED ORDER — VERAPAMIL HCL 2.5 MG/ML IV SOLN
INTRAVENOUS | Status: DC | PRN
  Administered 2014-12-30: 11:00:00 via INTRA_ARTERIAL

## 2014-12-30 MED ORDER — SODIUM CHLORIDE 0.9 % IV SOLN: 250.0000 mL | INTRAVENOUS | Status: DC | PRN

## 2014-12-30 MED ORDER — HEPARIN SODIUM (PORCINE) 1000 UNIT/ML IJ SOLN
INTRAMUSCULAR | Status: AC
  Filled 2014-12-30: qty 1

## 2014-12-30 MED ORDER — VERAPAMIL HCL 2.5 MG/ML IV SOLN
INTRAVENOUS | Status: AC
  Filled 2014-12-30: qty 2

## 2014-12-30 MED ORDER — SODIUM CHLORIDE 0.9 % WEIGHT BASED INFUSION: 1.0000 mL/kg/h | INTRAVENOUS | Status: AC

## 2014-12-30 MED ORDER — SODIUM CHLORIDE 0.9 % IJ SOLN: 3.0000 mL | INTRAMUSCULAR | Status: DC | PRN

## 2014-12-30 MED ORDER — ACETAMINOPHEN 325 MG PO TABS
650.0000 mg | ORAL_TABLET | ORAL | Status: DC | PRN
  Administered 2014-12-30 – 2014-12-31 (×2): 650 mg via ORAL

## 2014-12-30 MED ORDER — MELATONIN 3 MG PO TABS
3.0000 mg | ORAL_TABLET | Freq: Once | ORAL | Status: AC
  Administered 2014-12-30: 3 mg via ORAL
  Filled 2014-12-30: qty 1

## 2014-12-30 MED ORDER — SODIUM CHLORIDE 0.9 % IV SOLN: INTRAVENOUS | Status: DC

## 2014-12-30 MED ORDER — LIDOCAINE HCL (PF) 1 % IJ SOLN
INTRAMUSCULAR | Status: DC | PRN
  Administered 2014-12-30: 5 mL via SUBCUTANEOUS

## 2014-12-30 MED ORDER — FENTANYL CITRATE (PF) 100 MCG/2ML IJ SOLN
INTRAMUSCULAR | Status: AC
  Filled 2014-12-30: qty 2

## 2014-12-30 MED ORDER — LISINOPRIL 2.5 MG PO TABS
2.5000 mg | ORAL_TABLET | Freq: Every day | ORAL | Status: DC
  Administered 2014-12-30 – 2014-12-31 (×2): 2.5 mg via ORAL
  Filled 2014-12-30 (×2): qty 1

## 2014-12-30 MED ORDER — CARVEDILOL 3.125 MG PO TABS
3.1250 mg | ORAL_TABLET | Freq: Two times a day (BID) | ORAL | Status: DC
  Administered 2014-12-30 – 2014-12-31 (×2): 3.125 mg via ORAL
  Filled 2014-12-30 (×2): qty 1

## 2014-12-30 MED ORDER — HEPARIN (PORCINE) IN NACL 2-0.9 UNIT/ML-% IJ SOLN
INTRAMUSCULAR | Status: AC
  Filled 2014-12-30: qty 1000

## 2014-12-30 MED ORDER — ASPIRIN 81 MG PO CHEW
CHEWABLE_TABLET | ORAL | Status: AC
  Filled 2014-12-30: qty 1

## 2014-12-30 MED ORDER — SODIUM CHLORIDE 0.9 % IJ SOLN
3.0000 mL | Freq: Two times a day (BID) | INTRAMUSCULAR | Status: DC
  Administered 2014-12-30 – 2014-12-31 (×3): 3 mL via INTRAVENOUS

## 2014-12-30 MED ORDER — MIDAZOLAM HCL 2 MG/2ML IJ SOLN
INTRAMUSCULAR | Status: AC
  Filled 2014-12-30: qty 2

## 2014-12-30 MED ORDER — LIDOCAINE HCL (PF) 1 % IJ SOLN
INTRAMUSCULAR | Status: AC
  Filled 2014-12-30: qty 30

## 2014-12-30 MED ORDER — HEPARIN SODIUM (PORCINE) 1000 UNIT/ML IJ SOLN
INTRAMUSCULAR | Status: DC | PRN
  Administered 2014-12-30: 4000 [IU] via INTRAVENOUS

## 2014-12-30 MED ORDER — MIDAZOLAM HCL 2 MG/2ML IJ SOLN
INTRAMUSCULAR | Status: DC | PRN
  Administered 2014-12-30: 1 mg via INTRAVENOUS

## 2014-12-30 MED ORDER — ASPIRIN 81 MG PO CHEW: 81.0000 mg | CHEWABLE_TABLET | ORAL | Status: DC

## 2014-12-30 MED ORDER — IOHEXOL 350 MG/ML SOLN
INTRAVENOUS | Status: DC | PRN
  Administered 2014-12-30: 55 mL via INTRACARDIAC

## 2014-12-30 SURGICAL SUPPLY — 10 items
CATH INFINITI 5FR ANG PIGTAIL (CATHETERS) ×2 IMPLANT
CATH OPTITORQUE JACKY 4.0 5F (CATHETERS) ×2 IMPLANT
DEVICE RAD COMP TR BAND LRG (VASCULAR PRODUCTS) ×2 IMPLANT
GLIDESHEATH SLEND SS 6F .021 (SHEATH) ×2 IMPLANT
KIT HEART LEFT (KITS) ×2 IMPLANT
PACK CARDIAC CATHETERIZATION (CUSTOM PROCEDURE TRAY) ×2 IMPLANT
SYR MEDRAD MARK V 150ML (SYRINGE) ×2 IMPLANT
TRANSDUCER W/STOPCOCK (MISCELLANEOUS) ×2 IMPLANT
TUBING CIL FLEX 10 FLL-RA (TUBING) ×2 IMPLANT
WIRE SAFE-T 1.5MM-J .035X260CM (WIRE) ×2 IMPLANT

## 2014-12-30 NOTE — Progress Notes (Signed)
[place] for Heparin Indication: chest pain/ACS  Allergies  Allergen Reactions  . Cephalexin Shortness Of Breath and Rash    Patient Measurements:  Patient weight 83.7 kg IBW  54.7 kg Heparin Dosing Weight: 73 kg  Vital Signs: Temp: 97.7 F (36.5 C) (07/12 1954) Temp Source: Oral (07/12 1954) BP: 131/79 mmHg (07/12 1939) Pulse Rate: 82 (07/12 1954)  Labs:  Recent Labs  12/28/14 1648 12/28/14 1702 12/29/14 1452 12/29/14 2300  HGB  --   --  13.4 12.5  HCT  --  37.2 38.9 35.8*  PLT  --   --  149* 136*  LABPROT  --   --  13.5 13.6  INR  --   --  1.01 1.02  HEPARINUNFRC  --   --   --  0.37  CREATININE 0.64  --  0.69 0.78  TROPONINI 0.10*  --  0.34* 0.25*    CrCl cannot be calculated (Unknown ideal weight.).  Assessment: 54 y.o. female with chest pain for heparin  Goal of Therapy:  Heparin level 0.3-0.7 units/ml Monitor platelets by anticoagulation protocol: Yes   Plan:  Continue Heparin at current rate  Follow-up am labs.  Caryl Pina  12/30/2014 12:19 AM

## 2014-12-30 NOTE — Progress Notes (Signed)
Patient Name: [name] Date of Encounter: 12/30/2014  Principal Problem:   NSTEMI (non-ST elevated myocardial infarction) Active Problems:   GERD (gastroesophageal reflux disease)   HTN (hypertension)  SUBJECTIVE  Denies chest pain, SOB or palpitation.   CURRENT MEDS . aspirin EC  81 mg Oral Daily  . atorvastatin  80 mg Oral q1800  . metoprolol tartrate  12.5 mg Oral BID  . sodium chloride  3 mL Intravenous Q12H    OBJECTIVE  Filed Vitals:   12/30/14 0020 12/30/14 0500 12/30/14 0743 12/30/14 0846  BP: 114/69 99/62 136/81 104/65  Pulse: 75 62 93 70  Temp: 98.3 F (36.8 C) 97.8 F (36.6 C) 97.8 F (36.6 C)   TempSrc: Oral Oral Oral   Resp: 18 18 18    Height:      Weight:      SpO2: 97% 96% 94%     Intake/Output Summary (Last 24 hours) at 12/30/14 0857 Last data filed at 12/29/14 2231  Gross per 24 hour  Intake 455.35 ml  Output      0 ml  Net 455.35 ml   Filed Weights   12/29/14 2100  Weight: 181 lb (82.101 kg)    PHYSICAL EXAM  General: Pleasant, NAD. Neuro: Alert and oriented X 3. Moves all extremities spontaneously. Psych: Normal affect. HEENT:  Normal  Neck: Supple without bruits or JVD. Lungs:  Resp regular and unlabored, CTA. Heart: RRR no s3, s4, or murmurs. Abdomen: Soft, non-tender, non-distended, BS + x 4.  Extremities: No clubbing, cyanosis or edema. DP/PT/Radials 2+ and equal bilaterally.  Accessory Clinical Findings  CBC  Recent Labs  12/29/14 1452 12/29/14 2300 12/30/14 0421  WBC 4.1 4.0 3.3*  NEUTROABS 2.3 1.9  --   HGB 13.4 12.5 11.7*  HCT 38.9 35.8* 33.8*  MCV 90.0 89.3 89.2  PLT 149* 136* 540*   Basic Metabolic Panel  Recent Labs  12/29/14 1452 12/29/14 2300  NA 140 141  K 3.8 3.8  CL 104 107  CO2 27 27  GLUCOSE 91 122*  BUN 15 11  CREATININE 0.69 0.78  CALCIUM 9.1 8.9  MG  --  2.2   Liver Function Tests  Recent Labs  12/28/14 1648 12/29/14 1452  AST 18 25  ALT 9 15  ALKPHOS 64 59  BILITOT  <0.2 0.5  PROT 6.9 7.4  ALBUMIN  --  4.3    Recent Labs  12/29/14 1452  LIPASE 21*   Cardiac Enzymes  Recent Labs  12/29/14 1452 12/29/14 2300 12/30/14 0421  TROPONINI 0.34* 0.25* 0.16*   Fasting Lipid Panel  Recent Labs  12/30/14 0500  CHOL 272*  HDL 49  LDLCALC 202*  TRIG 106  CHOLHDL 5.6   Thyroid Function Tests  Recent Labs  12/29/14 2300  TSH 2.387    TELE  NSR at rate of 60s.   Radiology/Studies  Ct Abdomen W Contrast  12/03/2014   CLINICAL DATA:  Right upper quadrant burning and pain for 2 months. History parotid gland surgery. History of melanoma.  EXAM: CT ABDOMEN WITH CONTRAST  TECHNIQUE: Multidetector CT imaging of the abdomen was performed using the standard protocol following bolus administration of intravenous contrast.  CONTRAST:  170mL OMNIPAQUE IOHEXOL 300 MG/ML  SOLN  COMPARISON:  Multiple exams, including 03/08/2013 hand 11/26/2014  FINDINGS: Lower chest: Nodular scarring in the lingula, no change from 09/11/2006.  Hepatobiliary: Unremarkable  Pancreas: Unremarkable  Spleen: Unremarkable  Adrenals/Urinary Tract: 7 mm cyst in the right kidney lower  pole, no change from 2008. 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  Stomach/Bowel: Prominent stool in visualized colon.  Vascular/Lymphatic: Mild aortoiliac atherosclerotic vascular calcification.  Other: No supplemental non-categorized findings.  Musculoskeletal: Unremarkable  IMPRESSION: 1. Prominent stool in the visualize:-constipation not excluded. 2.  Aortoiliac atherosclerotic vascular disease.   Electronically Signed   By: Van Clines M.D.   On: 12/03/2014 10:07   Dg Chest Port 1 View  12/29/2014   CLINICAL DATA:  54 year old female with chest pain. History of melanoma 2001 post excision. Subsequent encounter.  EXAM: PORTABLE CHEST - 1 VIEW  COMPARISON:  10/22/2014 chest x-ray.  FINDINGS: No infiltrate, congestive heart failure or pneumothorax.  No plain  film evidence pulmonary malignancy.  Remote right clavicle fracture.  Central pulmonary vascular prominence stable.  Heart size within normal limits.  IMPRESSION: No acute abnormality.  Please see above.   Electronically Signed   By: Genia Del M.D.   On: 12/29/2014 15:08    ASSESSMENT AND PLAN   1. NSTEMI - presented with typical and atypical features. Peak of troponin was 0.34, now trending down. No further episode of chest pain. - After NG she did get transiently hypotensive, bp has normalized with IVF.  - Continue BB, statin, heparin gtt - Cath today. She is NPO. Risk and benefits of cath discussed at length. The patient understands that risks include but are not limited to stroke (1 in 1000), death (1 in 19), kidney failure [usually temporary] (1 in 500), bleeding (1 in 200), allergic reaction [possibly serious] (1 in 200), and agrees to proceed.    2. HL - 12/30/2014: Cholesterol 272*; HDL 49; LDL Cholesterol 202*; Triglycerides 106; VLDL 21  - Continue Statin  3. Elevated BNP - BNP of 620. She appears euvolemic.  - Continue to monitor.   Jarrett Soho PA-C Pager [phone]  Patient seen by B Bhagat.    Currently in cath lab  Will follow post procedure.   Dorris Carnes

## 2014-12-30 NOTE — Interval H&P Note (Signed)
[name] Visit (complete for each [name] visit)  Clinical Evaluation Leading to the Procedure:   ACS: Yes.    Non-ACS:    Anginal Classification: CCS IV  Anti-ischemic medical therapy: Minimal Therapy (1 class of medications)  Non-Invasive Test Results: No non-invasive testing performed  Prior CABG: No previous CABG      History and Physical Interval Note:  12/30/2014 10:48 AM  [name]  has presented today for surgery, with the diagnosis of nstemi  The various methods of treatment have been discussed with the patient and family. After consideration of risks, benefits and other options for treatment, the patient has consented to  Procedure(s): Left Heart Tammy and Coronary Angiography (N/A) as a surgical intervention .  The patient's history has been reviewed, patient examined, no change in status, stable for surgery.  I have reviewed the patient's chart and labs.  Questions were answered to the patient's satisfaction.     [name]

## 2014-12-30 NOTE — Progress Notes (Signed)
[place] for Heparin Indication: chest pain/ACS  Allergies  Allergen Reactions  . Cephalexin Shortness Of Breath and Rash    Patient Measurements: Height: 5\' 4"  (162.6 cm) Weight: 181 lb (82.101 kg) IBW/kg (Calculated) : 54.7 Heparin Dosing Weight: 73 kg  Vital Signs: Temp: 97.8 F (36.6 C) (07/13 0500) Temp Source: Oral (07/13 0500) BP: 99/62 mmHg (07/13 0500) Pulse Rate: 62 (07/13 0500)  Labs:  Recent Labs  12/28/14 1648  12/29/14 1452 12/29/14 2300 12/30/14 0421  HGB  --   < > 13.4 12.5 11.7*  HCT  --   < > 38.9 35.8* 33.8*  PLT  --   --  149* 136* 123*  LABPROT  --   --  13.5 13.6  --   INR  --   --  1.01 1.02  --   HEPARINUNFRC  --   --   --  0.37 0.19*  CREATININE 0.64  --  0.69 0.78  --   TROPONINI 0.10*  --  0.34* 0.25*  --   < > = values in this interval not displayed.  Estimated Creatinine Clearance: 81.4 mL/min (by C-G formula based on Cr of 0.78).  Assessment: 54 y.o. female on heparin for chest pain. Heparin level 0.19 (subtherapeutic) on 900 units/hr. Hgb and plt down a bit. No issues with line or bleeding per RN. For cath today at 1300.   Goal of Therapy:  Heparin level 0.3-0.7 units/ml Monitor platelets by anticoagulation protocol: Yes   Plan:  Increase heparin to 1100 units/hr F/u post cath  Sherlon Handing, PharmD, BCPS Clinical pharmacist, pager [phone] 12/30/2014 5:36 AM

## 2014-12-30 NOTE — Progress Notes (Signed)
UR Completed Ceaira Ernster Graves-Bigelow, RN,BSN [phone]  

## 2014-12-30 NOTE — H&P (View-Only) (Signed)
Primary cardiologist: Dr Jenkins Rouge Consulting cardiologist: Dr Carlyle Dolly  Clinical Summary [name] is a 54 y.o.female history of HTN admitted with chest pain. From Dr Kyla Balzarine notes history of prior chest pain with negative cath in [facility] 9 years ago, negative myoview 03/2012. This episode of chest pain started yesterday afternoon around 430pm. She became very upset with her boss, and suddenly felt chest pain, palpitaitons, and SOB. She reporte the pain has been constant since that time, though some variability on severity. Pain is not positional. She has had recent issues with her esophagus, and has had intermittent pains she associates with both as well. Current chest pain improved with NG in ER.   K 4.6, Cr 0.64, Trop 0.34, Hgb 12.8, Plt 156,  EKG diffuse TWIs CXR no acute process  Allergies  Allergen Reactions  . Cephalexin Shortness Of Breath and Rash    Medications Scheduled Medications:     Infusions: . sodium chloride    . sodium chloride       PRN Medications:     Past Medical History  Diagnosis Date  . PONV (postoperative nausea and vomiting)   . Headache(784.0)   . Arthritis     back   . Left ureteral calculus   . History of kidney stones   . History of melanoma excision     2001--  RIGHT SUPRAORBITAL (RIGHT FOREHEAD AND UPPER EYELID ----  S/P MOHS PROCDURE W/ SLN BX----   NO RECURRENCE  . History of parotidectomy     09-12-2006---   MYOEPITHIOMA  OF PAROTID SALVERY GLAND --  X35  RADIATION TX  COMPLETED AUGUST 2008--   NO RECURRENCE  . History of palpitations     STRESS INDUCED  . Asthma     Childhood  . Cancer     Melanoma, Parotid gland    Past Surgical History  Procedure Laterality Date  . Diagnostic laparoscopy  2010  . Kidney surgery  at age 39    [place]  . Melanoma excision with sentinel lymph node biopsy  2001    moh's procedure/  RIGHT FOREHEAD AND UPPER EYELID  . Lumbar  laminectomy/decompression microdiscectomy  05/18/2011    Procedure: LUMBAR LAMINECTOMY/DECOMPRESSION MICRODISCECTOMY;  Surgeon: Johnn Hai;  Location: [facility];  Service: Orthopedics;  Laterality: Right;  Decompression Lumbar 4-Lumbar 5  Right    (xray)   . Cystoscopy with retrograde pyelogram, ureteroscopy and stent placement Bilateral 03/19/2013    Procedure: CYSTOSCOPY WITH RETROGRADE PYELOGRAM, BILATERAL URETEROSCOPY AND STENT PLACEMENT LEFT URETER,BILATERAL STONE EXTRACTION , HOLMIUM LASER LEFT URETER;  Surgeon: Molli Hazard, MD;  Location: [facility];  Service: Urology;  Laterality: Bilateral;  . Right lateral parotidectomy w/ nerve dissection / right modified radical neck dissection sparing scm eleventh nerve and internal jugular vein  09-12-2006  DR DWIGHT BATES  . Diagnostic laparoscopy  04-12-2009  . Cardiovascular stress test  03-21-2012  DR [facility]    NORMAL NUCLEAR STUDY/  NO ISCHEMIA/  EF 63%  . Cardiac catheterization  2006 (APPROX)  [place]    NORMAL  . Breast surgery  2005- x 2 times    benign breast biopsies-bilateral  . Cystoscopy w/ ureteral stent placement Left 03/26/2013    Procedure: CYSTOSCOPY WITH RETROGRADE PYELOGRAM ;  Surgeon: Molli Hazard, MD;  Location: [facility];  Service: Urology;  Laterality: Left;  . Cystoscopy with stent placement Left 03/26/2013    Procedure: CYSTOSCOPY WITH STENT PLACEMENT;  Surgeon:  Molli Hazard, MD;  Location: [facility];  Service: Urology;  Laterality: Left;    Family History  Problem Relation Age of Onset  . Hypertension Mother   . COPD Mother   . Cancer Mother     breast  . Dementia Mother   . Heart disease Father   . Cancer Father     Colorectal  . Hyperlipidemia Father   . Hypertension Father     Social History [name] reports that she has never smoked. She has never used smokeless tobacco. [name] reports that she drinks alcohol.  Review of  Systems CONSTITUTIONAL: No weight loss, fever, chills, weakness or fatigue.  HEENT: Eyes: No visual loss, blurred vision, double vision or yellow sclerae. No hearing loss, sneezing, congestion, runny nose or sore throat.  SKIN: No rash or itching.  CARDIOVASCULAR: per HPI RESPIRATORY: No shortness of breath, cough or sputum.  GASTROINTESTINAL: No anorexia, nausea, vomiting or diarrhea. No abdominal pain or blood.  GENITOURINARY: no polyuria, no dysuria NEUROLOGICAL: No headache, dizziness, syncope, paralysis, ataxia, numbness or tingling in the extremities. No change in bowel or bladder control.  MUSCULOSKELETAL: No muscle, back pain, joint pain or stiffness.  HEMATOLOGIC: No anemia, bleeding or bruising.  LYMPHATICS: No enlarged nodes. No history of splenectomy.  PSYCHIATRIC: No history of depression or anxiety.      Physical Examination Blood pressure 104/64, pulse 77, resp. rate 20, last menstrual period 05/18/2011, SpO2 95 %. No intake or output data in the 24 hours ending 12/29/14 1613  HEENT: sclera clear  Cardiovascular: RRR, no m/r/g,no JVD  Respiratory: CTAB  GI: abdomen soft, NT, ND  MSK: no LE edema  Neuro: no focal deficits  Psych: appropriate affect   Lab Results  Basic Metabolic Panel:  Recent Labs Lab 12/28/14 1648 12/29/14 1452  NA 146* 140  K 4.6 3.8  CL 105 104  CO2 25 27  GLUCOSE 96 91  BUN 12 15  CREATININE 0.64 0.69  CALCIUM 9.6 9.1    Liver Function Tests:  Recent Labs Lab 12/28/14 1648 12/29/14 1452  AST 18 25  ALT 9 15  ALKPHOS 64 59  BILITOT <0.2 0.5  PROT 6.9 7.4  ALBUMIN  --  4.3    CBC:  Recent Labs Lab 12/28/14 1702 12/29/14 1452  WBC 4.2 4.1  NEUTROABS 2.4 2.3  HGB  --  13.4  HCT 37.2 38.9  MCV  --  90.0  PLT  --  149*    Cardiac Enzymes:  Recent Labs Lab 12/28/14 1648 12/29/14 1452  TROPONINI 0.10* 0.34*    BNP: Invalid input(s): POCBNP     Impression/Recommendations 1. NSTEMI - chest  pain symptoms are somewhat mixed and atypical, however significant troponin and EKG changes - history of chronic TWI anterior precordial leads, EKG today with more diffuse TWIs. Troponin elevated to 0.34 - she is currently pain free and hemodynamically stable - she has received ASA at home today, started on hep gtt in ER. Will plan for transfer to [facility] today with plans for cath tomorrow. She will need high dose statin, and beta blocker tonight if blood pressures ok. After NG she did get transiently hypotensive, bp has normalized with IVF. Likely ACE-I after cath.     Carlyle Dolly, M.D.

## 2014-12-31 ENCOUNTER — Telehealth: Payer: Self-pay | Admitting: Cardiovascular Disease

## 2014-12-31 DIAGNOSIS — I10 Essential (primary) hypertension: Secondary | ICD-10-CM

## 2014-12-31 LAB — CBC
HCT: 31.9 % — ABNORMAL LOW (ref 36.0–46.0)
Hemoglobin: 10.9 g/dL — ABNORMAL LOW (ref 12.0–15.0)
MCH: 30.8 pg (ref 26.0–34.0)
MCHC: 34.2 g/dL (ref 30.0–36.0)
MCV: 90.1 fL (ref 78.0–100.0)
Platelets: 124 10*3/uL — ABNORMAL LOW (ref 150–400)
RBC: 3.54 MIL/uL — AB (ref 3.87–5.11)
RDW: 12.9 % (ref 11.5–15.5)
WBC: 3 10*3/uL — AB (ref 4.0–10.5)

## 2014-12-31 LAB — LIPID PANEL
CHOL/HDL RATIO: 5.2 ratio
Cholesterol: 239 mg/dL — ABNORMAL HIGH (ref 0–200)
HDL: 46 mg/dL (ref >40)
LDL Cholesterol: 172 mg/dL — ABNORMAL HIGH (ref 0–99)
Triglycerides: 106 mg/dL (ref <150)
VLDL: 21 mg/dL (ref 0–40)

## 2014-12-31 LAB — BASIC METABOLIC PANEL
Anion gap: 6 (ref 5–15)
BUN: 11 mg/dL (ref 6–20)
CO2: 27 mmol/L (ref 22–32)
Calcium: 8.4 mg/dL — ABNORMAL LOW (ref 8.9–10.3)
Chloride: 108 mmol/L (ref 101–111)
Creatinine, Ser: 0.78 mg/dL (ref 0.44–1.00)
GFR calc Af Amer: >60 mL/min (ref >60)
GFR calc non Af Amer: >60 mL/min (ref >60)
Glucose, Bld: 105 mg/dL — ABNORMAL HIGH (ref 65–99)
Potassium: 3.9 mmol/L (ref 3.5–5.1)
SODIUM: 141 mmol/L (ref 135–145)

## 2014-12-31 LAB — HEMOGLOBIN A1C
Hgb A1c MFr Bld: 5.5 % (ref 4.8–5.6)
Mean Plasma Glucose: 111 mg/dL

## 2014-12-31 MED ORDER — LISINOPRIL 2.5 MG PO TABS: 2.5000 mg | ORAL_TABLET | Freq: Every day | ORAL | Status: DC

## 2014-12-31 MED ORDER — ATORVASTATIN CALCIUM 20 MG PO TABS: 20.0000 mg | ORAL_TABLET | Freq: Every day | ORAL | Status: DC

## 2014-12-31 MED ORDER — CARVEDILOL 3.125 MG PO TABS: 3.1250 mg | ORAL_TABLET | Freq: Two times a day (BID) | ORAL | Status: DC

## 2014-12-31 MED FILL — Heparin Sodium (Porcine) 2 Unit/ML in Sodium Chloride 0.9%: INTRAMUSCULAR | Qty: 1000 | Status: AC

## 2014-12-31 NOTE — Discharge Instructions (Addendum)
Chest Pain (Nonspecific) °It is often hard to give a specific diagnosis for the cause of chest pain. There is always a chance that your pain could be related to something serious, such as a heart attack or a blood clot in the lungs. You need to follow up with your health care provider for further evaluation. °CAUSES  °· Heartburn. °· Pneumonia or bronchitis. °· Anxiety or stress. °· Inflammation around your heart (pericarditis) or lung (pleuritis or pleurisy). °· A blood clot in the lung. °· A collapsed lung (pneumothorax). It can develop suddenly on its own (spontaneous pneumothorax) or from trauma to the chest. °· Shingles infection (herpes zoster virus). °The chest wall is composed of bones, muscles, and cartilage. Any of these can be the source of the pain. °· The bones can be bruised by injury. °· The muscles or cartilage can be strained by coughing or overwork. °· The cartilage can be affected by inflammation and become sore (costochondritis). °DIAGNOSIS  °Lab tests or other studies may be needed to find the cause of your pain. Your health care provider may have you take a test called an ambulatory electrocardiogram (ECG). An ECG records your heartbeat patterns over a 24-hour period. You may also have other tests, such as: °· Transthoracic echocardiogram (TTE). During echocardiography, sound waves are used to evaluate how blood flows through your heart. °· Transesophageal echocardiogram (TEE). °· Cardiac monitoring. This allows your health care provider to monitor your heart rate and rhythm in real time. °· Holter monitor. This is a portable device that records your heartbeat and can help diagnose heart arrhythmias. It allows your health care provider to track your heart activity for several days, if needed. °· Stress tests by exercise or by giving medicine that makes the heart beat faster. °TREATMENT  °· Treatment depends on what may be causing your chest pain. Treatment may include: °¨ Acid blockers for  heartburn. °¨ Anti-inflammatory medicine. °¨ Pain medicine for inflammatory conditions. °¨ Antibiotics if an infection is present. °· You may be advised to change lifestyle habits. This includes stopping smoking and avoiding alcohol, caffeine, and chocolate. °· You may be advised to keep your head raised (elevated) when sleeping. This reduces the chance of acid going backward from your stomach into your esophagus. °Most of the time, nonspecific chest pain will improve within 2-3 days with rest and mild pain medicine.  °HOME CARE INSTRUCTIONS  °· If antibiotics were prescribed, take them as directed. Finish them even if you start to feel better. °· For the next few days, avoid physical activities that bring on chest pain. Continue physical activities as directed. °· Do not use any tobacco products, including cigarettes, chewing tobacco, or electronic cigarettes. °· Avoid drinking alcohol. °· Only take medicine as directed by your health care provider. °· Follow your health care provider's suggestions for further testing if your chest pain does not go away. °· Keep any follow-up appointments you made. If you do not go to an appointment, you could develop lasting (chronic) problems with pain. If there is any problem keeping an appointment, call to reschedule. °SEEK MEDICAL CARE IF:  °· Your chest pain does not go away, even after treatment. °· You have a rash with blisters on your chest. °· You have a fever. °SEEK IMMEDIATE MEDICAL CARE IF:  °· You have increased chest pain or pain that spreads to your arm, neck, jaw, back, or abdomen. °· You have shortness of breath. °· You have an increasing cough, or you cough   up blood. °· You have severe back or abdominal pain. °· You feel nauseous or vomit. °· You have severe weakness. °· You faint. °· You have chills. °This is an emergency. Do not wait to see if the pain will go away. Get medical help at once. Call your local emergency services (911 in [place] Do not drive  yourself to the hospital. °MAKE SURE YOU:  °· Understand these instructions. °· Will watch your condition. °· Will get help right away if you are not doing well or get worse. °Document Released: 03/15/2005 Document Revised: 06/10/2013 Document Reviewed: 01/09/2008 °ExitCare® Patient Information ©2015 [employer]. This information is not intended to replace advice given to you by your health care provider. Make sure you discuss any questions you have with your health care provider. °Radial Site Care °Refer to this sheet in the next few weeks. These instructions provide you with information on caring for yourself after your procedure. Your caregiver may also give you more specific instructions. Your treatment has been planned according to current medical practices, but problems sometimes occur. Call your caregiver if you have any problems or questions after your procedure. °HOME CARE INSTRUCTIONS °· You may shower the day after the procedure. Remove the bandage (dressing) and gently wash the site with plain soap and water. Gently pat the site dry. °· Do not apply powder or lotion to the site. °· Do not submerge the affected site in water for 3 to 5 days. °· Inspect the site at least twice daily. °· Do not flex or bend the affected arm for 24 hours. °· No lifting over 5 pounds (2.3 kg) for 5 days after your procedure. °· Do not drive home if you are discharged the same day of the procedure. Have someone else drive you. °· You may drive 24 hours after the procedure unless otherwise instructed by your caregiver. °· Do not operate machinery or power tools for 24 hours. °· A responsible adult should be with you for the first 24 hours after you arrive home. °What to expect: °· Any bruising will usually fade within 1 to 2 weeks. °· Blood that collects in the tissue (hematoma) may be painful to the touch. It should usually decrease in size and tenderness within 1 to 2 weeks. °SEEK IMMEDIATE MEDICAL CARE IF: °· You have  unusual pain at the radial site. °· You have redness, warmth, swelling, or pain at the radial site. °· You have drainage (other than a small amount of blood on the dressing). °· You have chills. °· You have a fever or persistent symptoms for more than 72 hours. °· You have a fever and your symptoms suddenly get worse. °· Your arm becomes pale, cool, tingly, or numb. °· You have heavy bleeding from the site. Hold pressure on the site. °Document Released: 07/08/2010 Document Revised: 08/28/2011 Document Reviewed: 07/08/2010 °ExitCare® Patient Information ©2015 [employer]. This information is not intended to replace advice given to you by your health care provider. Make sure you discuss any questions you have with your health care provider. ° °

## 2014-12-31 NOTE — Progress Notes (Signed)
Patient Name: [name] Date of Encounter: 12/31/2014  Principal Problem:   NSTEMI (non-ST elevated myocardial infarction) Active Problems:   GERD (gastroesophageal reflux disease)   HTN (hypertension)  SUBJECTIVE  Denies chest pain, SOB or palpitation.   CURRENT MEDS . aspirin EC  81 mg Oral Daily  . carvedilol  3.125 mg Oral BID WC  . lisinopril  2.5 mg Oral Daily  . sodium chloride  3 mL Intravenous Q12H  . sodium chloride  3 mL Intravenous Q12H     OBJECTIVE  Filed Vitals:   12/30/14 1811 12/30/14 2017 12/31/14 0403 12/31/14 0932  BP: 109/67   144/75  Pulse: 75     Temp:  98.2 F (36.8 C) 97.5 F (36.4 C)   TempSrc:  Oral Oral   Resp:      Height:      Weight:   184 lb 9.6 oz (83.734 kg)   SpO2:       No intake or output data in the 24 hours ending 12/31/14 0946 Filed Weights   12/29/14 2100 12/31/14 0403  Weight: 181 lb (82.101 kg) 184 lb 9.6 oz (83.734 kg)    PHYSICAL EXAM  General: Pleasant, NAD. Neuro: Alert and oriented X 3. Moves all extremities spontaneously. Psych: Normal affect. HEENT:  Normal  Neck: Supple without bruits or JVD. Lungs:  Resp regular and unlabored, CTA. Heart: RRR no s3, s4, or murmurs. Abdomen: Soft, non-tender, non-distended, BS + x 4.  Extremities: No clubbing, cyanosis or edema. DP/PT/Radials 2+ and equal bilaterally. Right radial cath site without erythema, hematoma or bruit.  Accessory Clinical Findings  CBC  Recent Labs  12/29/14 1452 12/29/14 2300 12/30/14 0421 12/31/14 0316  WBC 4.1 4.0 3.3* 3.0*  NEUTROABS 2.3 1.9  --   --   HGB 13.4 12.5 11.7* 10.9*  HCT 38.9 35.8* 33.8* 31.9*  MCV 90.0 89.3 89.2 90.1  PLT 149* 136* 123* 010*   Basic Metabolic Panel  Recent Labs  12/29/14 2300 12/31/14 0316  NA 141 141  K 3.8 3.9  CL 107 108  CO2 27 27  GLUCOSE 122* 105*  BUN 11 11  CREATININE 0.78 0.78  CALCIUM 8.9 8.4*  MG 2.2  --    Liver Function Tests  Recent Labs  12/28/14 1648  12/29/14 1452  AST 18 25  ALT 9 15  ALKPHOS 64 59  BILITOT <0.2 0.5  PROT 6.9 7.4  ALBUMIN  --  4.3    Recent Labs  12/29/14 1452  LIPASE 21*   Cardiac Enzymes  Recent Labs  12/29/14 2300 12/30/14 0421 12/30/14 1437  TROPONINI 0.25* 0.16* 0.09*   Fasting Lipid Panel  Recent Labs  12/30/14 0500  CHOL 272*  HDL 49  LDLCALC 202*  TRIG 106  CHOLHDL 5.6   Thyroid Function Tests  Recent Labs  12/29/14 2300  TSH 2.387    TELE  NSR at rate of 70s.   Cath 12/30/2014 Conclusion    1. Normal coronary arteries. 2. Moderately to severely reduced LV systolic function with an ejection fraction of 30-35% with severe hypokinesis of the mid distal anterior, apical and mid to distal inferior walls consistent with stress-induced cardiomyopathy. 3. Mildly elevated left ventricular end-diastolic pressure.  Recommendations: Recommend a small dose beta blocker and ACE inhibitor. Avoid stress. Monitor for at least another day and possible discharge home tomorrow if she remains stable.    Coronary Findings    Dominance: Right   Left Main  The vessel  is angiographically normal.     Left Anterior Descending  The vessel is angiographically normal.   . First Diagonal Branch   The vessel is angiographically normal.   . Second Diagonal Branch   The vessel is angiographically normal.   . Third Diagonal Branch   The vessel is angiographically normal.     Left Circumflex  The vessel is angiographically normal.   . First Obtuse Marginal Branch   The vessel is angiographically normal.   . Second Obtuse Marginal Branch   The vessel is angiographically normal.   . Third Obtuse Marginal Branch   The vessel is angiographically normal.     Right Coronary Artery  The vessel is angiographically normal.   . Right Posterior Descending Artery   The vessel is angiographically normal.   . Right Posterior Atrioventricular Branch   The vessel is angiographically normal.   .  First Right Posterolateral   The vessel is angiographically normal.       Radiology/Studies  Ct Abdomen W Contrast  12/03/2014   CLINICAL DATA:  Right upper quadrant burning and pain for 2 months. History parotid gland surgery. History of melanoma.  EXAM: CT ABDOMEN WITH CONTRAST  TECHNIQUE: Multidetector CT imaging of the abdomen was performed using the standard protocol following bolus administration of intravenous contrast.  CONTRAST:  157mL OMNIPAQUE IOHEXOL 300 MG/ML  SOLN  COMPARISON:  Multiple exams, including 03/08/2013 hand 11/26/2014  FINDINGS: Lower chest: Nodular scarring in the lingula, no change from 09/11/2006.  Hepatobiliary: Unremarkable  Pancreas: Unremarkable  Spleen: Unremarkable  Adrenals/Urinary Tract: 7 mm cyst in the right kidney lower pole, no change from 2008. 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  Stomach/Bowel: Prominent stool in visualized colon.  Vascular/Lymphatic: Mild aortoiliac atherosclerotic vascular calcification.  Other: No supplemental non-categorized findings.  Musculoskeletal: Unremarkable  IMPRESSION: 1. Prominent stool in the visualize:-constipation not excluded. 2.  Aortoiliac atherosclerotic vascular disease.   Electronically Signed   By: Van Clines M.D.   On: 12/03/2014 10:07   Dg Chest Port 1 View  12/29/2014   CLINICAL DATA:  54 year old female with chest pain. History of melanoma 2001 post excision. Subsequent encounter.  EXAM: PORTABLE CHEST - 1 VIEW  COMPARISON:  10/22/2014 chest x-ray.  FINDINGS: No infiltrate, congestive heart failure or pneumothorax.  No plain film evidence pulmonary malignancy.  Remote right clavicle fracture.  Central pulmonary vascular prominence stable.  Heart size within normal limits.  IMPRESSION: No acute abnormality.  Please see above.   Electronically Signed   By: Genia Del M.D.   On: 12/29/2014 15:08    ASSESSMENT AND PLAN   1. NSTEMI -Cath showed normal coronary  arteries; moderately to severely reduced LV systolic function with an ejection fraction of 30-35% with severe hypokinesis of the mid distal anterior, apical and mid to distal inferior walls consistent with stress-induced cardiomyopathy; mildly elevated left ventricular end-diastolic pressure. Echo? - Continue Coreg 3.125mg  BID, lisinopril 2.5mg  daily, ASA 81mg ,   2. HL - 12/30/2014: Cholesterol 272*; HDL 49; LDL Cholesterol 202*; Triglycerides 106; VLDL 21  - Discontinued statin post. Consider resuming low dose lipitor 40mg  given high LDL level.   3. Elevated BNP - BNP of 620. She appears euvolemic.   Dispo: She already has appointment with Dr. Johnsie Cancel at [address] @ 11:00am 02/11/15    Signed, Leanor Kail PA-C Pager [phone]  Pt seen and examined  Agree with findings as noted by B Bhagat above   Plan to d/c  on current regimen  WIll need outpt f/u in a couple wks then with P Nishan as scheduled  Will need f/u echo in a few months to reassess LVEF.   Dorris Carnes

## 2014-12-31 NOTE — Telephone Encounter (Signed)
New message    TCM appt on  7.25.2016 with Richardson Dopp per Granger PA,

## 2014-12-31 NOTE — Discharge Summary (Signed)
Discharge Summary   Patient ID: [name],  MRN: [id number], DOB/AGE: [birth year] 54 y.o.  Admit date: 12/29/2014 Discharge date: 12/31/2014  Primary Care Provider: [place] Primary Cardiologist: Dr. Johnsie Cancel  Discharge Diagnoses Principal Problem:   NSTEMI (non-ST elevated myocardial infarction) Active Problems:   GERD (gastroesophageal reflux disease)   HTN (hypertension)   Allergies Allergies  Allergen Reactions  . Cephalexin Shortness Of Breath and Rash    Procedures  Cath 12/30/2014 Conclusion    1. Normal coronary arteries. 2. Moderately to severely reduced LV systolic function with an ejection fraction of 30-35% with severe hypokinesis of the mid distal anterior, apical and mid to distal inferior walls consistent with stress-induced cardiomyopathy. 3. Mildly elevated left ventricular end-diastolic pressure.  Recommendations: Recommend a small dose beta blocker and ACE inhibitor. Avoid stress. Monitor for at least another day and possible discharge home tomorrow if she remains stable.    Coronary Findings    Dominance: Right   Left Main  The vessel is angiographically normal.     Left Anterior Descending  The vessel is angiographically normal.   . First Diagonal Branch   The vessel is angiographically normal.   . Second Diagonal Branch   The vessel is angiographically normal.   . Third Diagonal Branch   The vessel is angiographically normal.     Left Circumflex  The vessel is angiographically normal.   . First Obtuse Marginal Branch   The vessel is angiographically normal.   . Second Obtuse Marginal Branch   The vessel is angiographically normal.   . Third Obtuse Marginal Branch   The vessel is angiographically normal.     Right Coronary Artery  The vessel is angiographically normal.   . Right Posterior Descending Artery   The vessel is angiographically normal.   . Right  Posterior Atrioventricular Branch   The vessel is angiographically normal.   . First Right Posterolateral   The vessel is angiographically normal.             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   From Dr Kyla Balzarine notes history of prior chest pain with negative cath in [facility] 9 years ago, negative myoview 03/2012. This episode of chest pain started 7/11 afternoon. She became very upset with her boss, and suddenly felt chest pain, palpitaitons, and SOB. Her pain was consistent, though some variability on severity. Pain is not positional. She also felt palpitation. She went to see her PCP later in day 7/11, where she was started on clonazepam and levofloxacin was discontinued and draw blood for lab work. Her troponin lever came back as 0.10 and she advised to go to ER.   In ER, at [facility] her Troponin was 0.34, EKG showed chronicTWI anterior precordial leads, along with with more diffuse TWIs. She was received ASA, started on heparin gtt and low dose BB. She became transient hypotensive on NG, however BP was normalized with IVF. Seen by cardiologist at [facility] and decision made to transfered her to [facility] for cath next day.     Hospital Course  She was transferred to [facility] 7/12 via [place]. No episode of chest pain overnight. She was started on statin for LDL of 202. Cath showed normal coronary arteries; moderately to  severely reduced LV systolic function with an ejection fraction of 30-35% with severe hypokinesis of the mid distal anterior, apical and mid to distal inferior walls consistent with stress-induced cardiomyopathy; mildly elevated left ventricular end-diastolic pressure. She was started on lisinopril 2.5mg  daily. Her  BNP was 620, however she appeared Euvolemic. She was resume on low dose of statin of hyperlipidemia. She will need echo in a few months to reassess LVEF. HgbA1C was 5.5.  Discharge Vitals Blood pressure 144/75, pulse 75, temperature 97.5 F (36.4 C), temperature source Oral, resp. rate 16, height 5\' 4"  (1.626 m), weight 184 lb 9.6 oz (83.734 kg), last menstrual period 05/18/2011, SpO2 97 %.  Filed Weights   12/29/14 2100 12/31/14 0403  Weight: 181 lb (82.101 kg) 184 lb 9.6 oz (83.734 kg)    Labs  CBC  Recent Labs  12/29/14 1452 12/29/14 2300 12/30/14 0421 12/31/14 0316  WBC 4.1 4.0 3.3* 3.0*  NEUTROABS 2.3 1.9  --   --   HGB 13.4 12.5 11.7* 10.9*  HCT 38.9 35.8* 33.8* 31.9*  MCV 90.0 89.3 89.2 90.1  PLT 149* 136* 123* 361*   Basic Metabolic Panel  Recent Labs  12/29/14 2300 12/31/14 0316  NA 141 141  K 3.8 3.9  CL 107 108  CO2 27 27  GLUCOSE 122* 105*  BUN 11 11  CREATININE 0.78 0.78  CALCIUM 8.9 8.4*  MG 2.2  --    Liver Function Tests  Recent Labs  12/28/14 1648 12/29/14 1452  AST 18 25  ALT 9 15  ALKPHOS 64 59  BILITOT <0.2 0.5  PROT 6.9 7.4  ALBUMIN  --  4.3    Recent Labs  12/29/14 1452  LIPASE 21*   Cardiac Enzymes  Recent Labs  12/29/14 2300 12/30/14 0421 12/30/14 1437  TROPONINI 0.25* 0.16* 0.09*   Hemoglobin A1C  Recent Labs  12/29/14 2300  HGBA1C 5.5   Fasting Lipid Panel  Recent Labs  12/31/14 0316  CHOL 239*  HDL 46  LDLCALC 172*  TRIG 106  CHOLHDL 5.2   Thyroid Function Tests  Recent Labs  12/29/14 2300  TSH 2.387    Disposition  Pt is being discharged home today in good condition.  Follow-up Plans & Appointments  Follow-up Information    Follow up with Claretta Fraise, MD.   Specialty:  Family Medicine   Contact information:   [place] [address] [phone]       Follow up with Richardson Dopp, PA-C On 01/11/2015.   Specialties:  Physician Assistant, Radiology, Interventional  Cardiology   Why:  @2 :40 for TCM follow up   Contact information:   [address] [place] [address] [phone]           Discharge Instructions    Amb Referral to Cardiac Rehabilitation    Complete by:  As directed   Congestive Heart Failure: If diagnosis is Heart Failure, patient MUST meet each of the CMS criteria: 1. Left Ventricular Ejection Fraction </= 35% 2. NYHA class II-IV symptoms despite being on optimal heart failure therapy for at least 6 weeks. 3. Stable = have not had a recent (<6 weeks) or planned (<6 months) major cardiovascular hospitalization or procedure  Program Details: - Physician supervised classes - 1-3 classes per week over a 12-18 week period, generally for a total of 36 sessions  Physician Certification: I certify that the above Cardiac Rehabilitation treatment is medically necessary and is medically approved by me for treatment  of this patient. The patient is willing and cooperative, able to ambulate and medically stable to participate in exercise rehabilitation. The participant's progress and Individualized Treatment Plan will be reviewed by the Medical Director, Cardiac Rehab staff and as indicated by the Referring/Ordering Physician.  Diagnosis:  Myocardial Infarction  Takotsubos cardiomyopathy     Diet - low sodium heart healthy    Complete by:  As directed      Discharge instructions    Complete by:  As directed   NO HEAVY LIFTING (>10lbs) X 2 WEEKS. NO SEXUAL ACTIVITY X 2 WEEKS. NO DRIVING X 1 WEEK. NO SOAKING BATHS, HOT TUBS, POOLS, ETC., X 7 DAYS.     Increase activity slowly    Complete by:  As directed            F/u Labs/Studies: Consider OP f/u labs 6-8 weeks given statin initiation this admission.  Discharge Medications    Medication List    STOP taking these medications        levofloxacin 500 MG tablet  Commonly known as:  LEVAQUIN      TAKE these medications         aspirin-acetaminophen-caffeine 115-726-20 MG per tablet  Commonly known as:  EXCEDRIN MIGRAINE  Take 2 tablets by mouth daily as needed for headache or migraine.     atorvastatin 20 MG tablet  Commonly known as:  LIPITOR  Take 1 tablet (20 mg total) by mouth daily.     carvedilol 3.125 MG tablet  Commonly known as:  COREG  Take 1 tablet (3.125 mg total) by mouth 2 (two) times daily with a meal.     clonazepam 0.125 MG disintegrating tablet  Commonly known as:  KLONOPIN  Take 1 tablet (0.125 mg total) by mouth 2 (two) times daily as needed (nerves).     dicyclomine 10 MG capsule  Commonly known as:  BENTYL  Take 1 capsule (10 mg total) by mouth 4 (four) times daily -  before meals and at bedtime.     Linaclotide 145 MCG Caps capsule  Commonly known as:  LINZESS  Take 1 capsule (145 mcg total) by mouth daily. For bowel management and pain     lisinopril 2.5 MG tablet  Commonly known as:  PRINIVIL,ZESTRIL  Take 1 tablet (2.5 mg total) by mouth daily.     ondansetron 8 MG disintegrating tablet  Commonly known as:  ZOFRAN-ODT  Take 1 tablet (8 mg total) by mouth every 6 (six) hours as needed for nausea or vomiting.     THERATEARS ALLERGY 0.025 % ophthalmic solution  Generic drug:  ketotifen  Apply 1 drop to eye daily as needed (for dry eye relief).        Duration of Discharge Encounter   Greater than 30 minutes including physician time.  Signed, Vinton Layson PA-C 12/31/2014, 1:07 PM

## 2014-12-31 NOTE — Progress Notes (Signed)
CARDIAC REHAB PHASE I   PRE:  Rate/Rhythm: 66 SR  BP:  Sitting: 139/64        SaO2: 97 RA  MODE:  Ambulation: 550 ft   POST:  Rate/Rhythm: 86 SR  BP:  Sitting: 152/82         SaO2: 97 RA   Pt ambulated 550 ft on RA, independent, steady gait, tolerated well.  Pt denies cp, dizziness, DOE, declined rest stop. Completed MI education.  Reviewed risk factors, stress induced cardiomyopathy, activity restrictions, exercise, heart healthy diet, portion control, daily weights, sodium and fluid restrictions and phase 2 cardiac rehab. Pt verbalized understanding. Pt agrees to phase 2 cardiac rehab. Will send referral to [place].  Very pleasant pt, states she has been under significant stress with her job and has "not been exercising and not been eating right."  Pt states she is working to make changes to limit stress in her job and and is receptive to education regarding diet and exercise. Pt to bed per pt request after walk, call bell within reach.  [phone]  Tammy Sciara, RN, BSN 12/31/2014 9:27 AM

## 2015-01-01 ENCOUNTER — Telehealth: Payer: Self-pay

## 2015-01-01 NOTE — Telephone Encounter (Signed)
Patient called for post discharge call.  VM left

## 2015-01-01 NOTE — Telephone Encounter (Signed)
Patient contacted regarding discharge from [facility].   Patient understands to follow up with provider On 01/11/15 240pm with Richardson Dopp .  Patient understands discharge instructions Patient understands medications and regiment Patient understands to bring all medications to this visit  Patient did voice her frustration that she did not see her own physician, Dr. Johnsie Cancel while she was in the hospital.  Michela Pitcher she could hear his voice in the hallway but he never stopped in.  She said I can understand with 30 cardiologist they all have something specific to do but the reason I come there is to see my doctor.  Can I see Dr. Johnsie Cancel instead of this Nicki Reaper person when I come in for my follow up visit?  I told her I would pass the message along to scheduling to see what they could do. I said there is a pretty good chance that it could not occur because of the heavy schedules with all the cardi logist.  We want for you to be followed up with someone in our cardiologist department soon after discharge so we can make sure everything is done for you correctly.  She thanked me for listening to her.  I said I would pass her message along to those who work on solutions to her concerns.

## 2015-01-01 NOTE — Telephone Encounter (Signed)
Patient called and asked if she could restart her supplements, Calcium 600 mg and D3 800 along with [employer] Complete Multivitamin over 50.  Discussed with Ronnald Collum, pharmacist and she said that she could resume  Called patient and told her she could resume those two supplements listed above.

## 2015-01-04 ENCOUNTER — Telehealth: Payer: Self-pay | Admitting: Cardiovascular Disease

## 2015-01-04 NOTE — Telephone Encounter (Signed)
Patient called in stating [facility] & [employer] papers should be faxed in I explained the 10-14 day waiting period,ROI and charge of $25.00 per form that is needed before process of paperwork can be started I also gave patient HIM fax at [facility]. Once forms are received I will forward to [place]. ROI Mailed to patient home address per her request.

## 2015-01-05 ENCOUNTER — Emergency Department (HOSPITAL_COMMUNITY): Payer: BLUE CROSS/BLUE SHIELD

## 2015-01-05 ENCOUNTER — Encounter (HOSPITAL_COMMUNITY): Payer: Self-pay | Admitting: Neurology

## 2015-01-05 ENCOUNTER — Observation Stay (HOSPITAL_COMMUNITY)
Admission: EM | Admit: 2015-01-05 | Discharge: 2015-01-07 | Disposition: A | Discharge status: Home / Self Care | Payer: BLUE CROSS/BLUE SHIELD | Attending: Emergency Medicine | Admitting: Emergency Medicine

## 2015-01-05 ENCOUNTER — Telehealth: Payer: Self-pay

## 2015-01-05 DIAGNOSIS — I951 Orthostatic hypotension: Secondary | ICD-10-CM | POA: Diagnosis present

## 2015-01-05 DIAGNOSIS — I252 Old myocardial infarction: Secondary | ICD-10-CM | POA: Insufficient documentation

## 2015-01-05 DIAGNOSIS — N201 Calculus of ureter: Secondary | ICD-10-CM | POA: Diagnosis not present

## 2015-01-05 DIAGNOSIS — I214 Non-ST elevation (NSTEMI) myocardial infarction: Secondary | ICD-10-CM | POA: Diagnosis present

## 2015-01-05 DIAGNOSIS — I5181 Takotsubo syndrome: Secondary | ICD-10-CM | POA: Diagnosis not present

## 2015-01-05 DIAGNOSIS — R002 Palpitations: Principal | ICD-10-CM | POA: Insufficient documentation

## 2015-01-05 DIAGNOSIS — K589 Irritable bowel syndrome without diarrhea: Secondary | ICD-10-CM | POA: Diagnosis present

## 2015-01-05 DIAGNOSIS — K219 Gastro-esophageal reflux disease without esophagitis: Secondary | ICD-10-CM | POA: Diagnosis present

## 2015-01-05 DIAGNOSIS — F329 Major depressive disorder, single episode, unspecified: Secondary | ICD-10-CM | POA: Diagnosis not present

## 2015-01-05 DIAGNOSIS — Z79899 Other long term (current) drug therapy: Secondary | ICD-10-CM | POA: Diagnosis not present

## 2015-01-05 DIAGNOSIS — K828 Other specified diseases of gallbladder: Secondary | ICD-10-CM | POA: Diagnosis present

## 2015-01-05 DIAGNOSIS — M1990 Unspecified osteoarthritis, unspecified site: Secondary | ICD-10-CM | POA: Insufficient documentation

## 2015-01-05 DIAGNOSIS — J45909 Unspecified asthma, uncomplicated: Secondary | ICD-10-CM | POA: Insufficient documentation

## 2015-01-05 DIAGNOSIS — Q6100 Congenital renal cyst, unspecified: Secondary | ICD-10-CM | POA: Diagnosis not present

## 2015-01-05 DIAGNOSIS — I255 Ischemic cardiomyopathy: Secondary | ICD-10-CM | POA: Diagnosis present

## 2015-01-05 DIAGNOSIS — R0602 Shortness of breath: Secondary | ICD-10-CM | POA: Diagnosis not present

## 2015-01-05 DIAGNOSIS — I428 Other cardiomyopathies: Secondary | ICD-10-CM | POA: Diagnosis not present

## 2015-01-05 DIAGNOSIS — N200 Calculus of kidney: Secondary | ICD-10-CM | POA: Insufficient documentation

## 2015-01-05 DIAGNOSIS — R55 Syncope and collapse: Secondary | ICD-10-CM | POA: Diagnosis present

## 2015-01-05 DIAGNOSIS — I5022 Chronic systolic (congestive) heart failure: Secondary | ICD-10-CM | POA: Diagnosis not present

## 2015-01-05 DIAGNOSIS — E780 Pure hypercholesterolemia: Secondary | ICD-10-CM | POA: Diagnosis not present

## 2015-01-05 DIAGNOSIS — Z85820 Personal history of malignant melanoma of skin: Secondary | ICD-10-CM | POA: Insufficient documentation

## 2015-01-05 DIAGNOSIS — I429 Cardiomyopathy, unspecified: Secondary | ICD-10-CM | POA: Insufficient documentation

## 2015-01-05 DIAGNOSIS — Z87898 Personal history of other specified conditions: Secondary | ICD-10-CM

## 2015-01-05 HISTORY — DX: Takotsubo syndrome: I51.81

## 2015-01-05 HISTORY — DX: Personal history of other specified conditions: Z87.898

## 2015-01-05 LAB — HEPATIC FUNCTION PANEL
ALBUMIN: 4.3 g/dL (ref 3.5–5.0)
ALT: 19 U/L (ref 14–54)
AST: 25 U/L (ref 15–41)
Alkaline Phosphatase: 52 U/L (ref 38–126)
Total Bilirubin: 0.7 mg/dL (ref 0.3–1.2)
Total Protein: 7.2 g/dL (ref 6.5–8.1)

## 2015-01-05 LAB — CBC
HCT: 39.6 % (ref 36.0–46.0)
Hemoglobin: 14 g/dL (ref 12.0–15.0)
MCH: 31.6 pg (ref 26.0–34.0)
MCHC: 35.4 g/dL (ref 30.0–36.0)
MCV: 89.4 fL (ref 78.0–100.0)
Platelets: 187 10*3/uL (ref 150–400)
RBC: 4.43 MIL/uL (ref 3.87–5.11)
RDW: 12.5 % (ref 11.5–15.5)
WBC: 4.8 10*3/uL (ref 4.0–10.5)

## 2015-01-05 LAB — I-STAT TROPONIN, ED: TROPONIN I, POC: 0 ng/mL (ref 0.00–0.08)

## 2015-01-05 LAB — BASIC METABOLIC PANEL
ANION GAP: 11 (ref 5–15)
BUN: 18 mg/dL (ref 6–20)
CALCIUM: 9.8 mg/dL (ref 8.9–10.3)
CHLORIDE: 99 mmol/L — AB (ref 101–111)
CO2: 26 mmol/L (ref 22–32)
CREATININE: 0.97 mg/dL (ref 0.44–1.00)
GFR calc Af Amer: >60 mL/min (ref >60)
GFR calc non Af Amer: >60 mL/min (ref >60)
GLUCOSE: 92 mg/dL (ref 65–99)
POTASSIUM: 4.4 mmol/L (ref 3.5–5.1)
SODIUM: 136 mmol/L (ref 135–145)

## 2015-01-05 LAB — LIPASE, BLOOD: Lipase: 26 U/L (ref 22–51)

## 2015-01-05 LAB — TROPONIN I: Troponin I: <0.03 ng/mL (ref <0.031)

## 2015-01-05 MED ORDER — ACETAMINOPHEN 325 MG PO TABS
650.0000 mg | ORAL_TABLET | Freq: Four times a day (QID) | ORAL | Status: DC | PRN
  Administered 2015-01-05 – 2015-01-07 (×4): 650 mg via ORAL
  Filled 2015-01-05 (×4): qty 2

## 2015-01-05 MED ORDER — ONDANSETRON 8 MG PO TBDP
8.0000 mg | ORAL_TABLET | Freq: Four times a day (QID) | ORAL | Status: DC | PRN
  Filled 2015-01-05: qty 1

## 2015-01-05 MED ORDER — CALCIUM CARB-CHOLECALCIFEROL 600-200 MG-UNIT PO TABS: 1.0000 | ORAL_TABLET | Freq: Every day | ORAL | Status: DC

## 2015-01-05 MED ORDER — CARVEDILOL 3.125 MG PO TABS
3.1250 mg | ORAL_TABLET | Freq: Two times a day (BID) | ORAL | Status: DC
  Administered 2015-01-05 – 2015-01-07 (×4): 3.125 mg via ORAL
  Filled 2015-01-05 (×4): qty 1

## 2015-01-05 MED ORDER — OXYCODONE HCL 5 MG PO TABS
5.0000 mg | ORAL_TABLET | ORAL | Status: DC | PRN
  Administered 2015-01-05 – 2015-01-07 (×5): 5 mg via ORAL
  Filled 2015-01-05 (×5): qty 1

## 2015-01-05 MED ORDER — DOCUSATE SODIUM 100 MG PO CAPS
100.0000 mg | ORAL_CAPSULE | Freq: Two times a day (BID) | ORAL | Status: DC
  Administered 2015-01-05 – 2015-01-07 (×4): 100 mg via ORAL
  Filled 2015-01-05 (×5): qty 1

## 2015-01-05 MED ORDER — ALUM & MAG HYDROXIDE-SIMETH 200-200-20 MG/5ML PO SUSP: 30.0000 mL | Freq: Four times a day (QID) | ORAL | Status: DC | PRN

## 2015-01-05 MED ORDER — ATORVASTATIN CALCIUM 20 MG PO TABS
20.0000 mg | ORAL_TABLET | Freq: Every day | ORAL | Status: DC
  Administered 2015-01-06 – 2015-01-07 (×2): 20 mg via ORAL
  Filled 2015-01-05 (×2): qty 1

## 2015-01-05 MED ORDER — ACETAMINOPHEN 650 MG RE SUPP: 650.0000 mg | Freq: Four times a day (QID) | RECTAL | Status: DC | PRN

## 2015-01-05 MED ORDER — LISINOPRIL 2.5 MG PO TABS
2.5000 mg | ORAL_TABLET | Freq: Every day | ORAL | Status: DC
  Filled 2015-01-05: qty 1

## 2015-01-05 MED ORDER — ADULT MULTIVITAMIN W/MINERALS CH
1.0000 | ORAL_TABLET | Freq: Every day | ORAL | Status: DC
  Administered 2015-01-06 – 2015-01-07 (×2): 1 via ORAL
  Filled 2015-01-05 (×4): qty 1

## 2015-01-05 MED ORDER — KETOTIFEN FUMARATE 0.025 % OP SOLN
1.0000 [drp] | Freq: Every day | OPHTHALMIC | Status: DC | PRN
  Filled 2015-01-05: qty 5

## 2015-01-05 MED ORDER — SODIUM CHLORIDE 0.9 % IJ SOLN
3.0000 mL | Freq: Two times a day (BID) | INTRAMUSCULAR | Status: DC
  Administered 2015-01-05 – 2015-01-07 (×4): 3 mL via INTRAVENOUS

## 2015-01-05 MED ORDER — ASPIRIN-ACETAMINOPHEN-CAFFEINE 250-250-65 MG PO TABS
2.0000 | ORAL_TABLET | Freq: Every day | ORAL | Status: DC
  Administered 2015-01-06 – 2015-01-07 (×2): 2 via ORAL
  Filled 2015-01-05 (×2): qty 2

## 2015-01-05 MED ORDER — CLONAZEPAM 0.125 MG PO TBDP: 0.1250 mg | ORAL_TABLET | Freq: Every day | ORAL | Status: DC | PRN

## 2015-01-05 MED ORDER — CALCIUM CARBONATE-VITAMIN D 500-200 MG-UNIT PO TABS
1.0000 | ORAL_TABLET | Freq: Every day | ORAL | Status: DC
  Administered 2015-01-06 – 2015-01-07 (×2): 1 via ORAL
  Filled 2015-01-05 (×2): qty 1

## 2015-01-05 MED ORDER — LINACLOTIDE 145 MCG PO CAPS: 145.0000 ug | ORAL_CAPSULE | Freq: Every day | ORAL | Status: DC

## 2015-01-05 MED ORDER — ENOXAPARIN SODIUM 40 MG/0.4ML ~~LOC~~ SOLN
40.0000 mg | SUBCUTANEOUS | Status: DC
  Administered 2015-01-05: 40 mg via SUBCUTANEOUS
  Filled 2015-01-05: qty 0.4

## 2015-01-05 NOTE — ED Notes (Signed)
Pt reports dizzy, nauseated, and feeling sweaty today with heart "fluttering" and palpations. Reports central chest pain that feels like it is going to jump out of her chest. Coming here from [employer].

## 2015-01-05 NOTE — Telephone Encounter (Signed)
Patient into office to pick up medical records. At check-in, patient complained of chest discomfort and Triage was called. Spoke with patient and reviewed symptoms and medications and med list updated. Patient c/o midsternal chest discomfort (she does not describe this as new - it was a symptom before her NSTEMI), nausea (she took a Zofran today), dizziness, sweating, RLQ pain, and spasms in her R neck radiating down R arm since 0400 this AM. Patient is in no obvious distress at this time but st she just "does not feel right." Per Dr. Ron Parker, DOD, advised the patient to go to the ED for further assessment given her plethora of complaints and recent cardiac history.

## 2015-01-05 NOTE — ED Provider Notes (Signed)
CSN: [id number]     Arrival date & time 01/05/15  1335 History   First MD Initiated Contact with Patient 01/05/15 1508     Chief Complaint  Patient presents with  . Palpitations     (Consider location/radiation/quality/duration/timing/severity/associated sxs/prior Treatment) HPI Comments: Pt comes in with cc of dizziness, chest palpitations. Pt has hx of NICM with EF 35% that was recently uncovered. Pt reports that she went to bed feeling ok, but couldn't sleep, and around 4 am she started having chest tightness, similar to her NSTEMI pain, with dizziness. Pt felt on more than one occasion that she might faint. She also had associated palpitations. Currently, pt doesn't have some of the symptoms she had, however she has a new L sided abdominal pain. She typically gets R sided pain. No diarrhea, no trauma, no uti like sx.   ROS 10 Systems reviewed and are negative for acute change except as noted in the HPI.     The history is provided by the patient.    Past Medical History  Diagnosis Date  . PONV (postoperative nausea and vomiting)   . Left ureteral calculus   . History of palpitations     STRESS INDUCED  . Kidney stones   . Kidney cysts     "I think it was right"  . Hypercholesterolemia dx'd 12/2014  . Childhood asthma   . NSTEMI (non-ST elevated myocardial infarction) 12/28/2014  . GERD (gastroesophageal reflux disease)     "I don't take RX for it" (12/30/2014)  . Daily headache   . Arthritis     back (12/30/2014)  . Anxiety   . Depression   . Malignant melanoma of skin of eyebrow 2001     RIGHT SUPRAORBITAL (RIGHT FOREHEAD AND UPPER EYELID ----  S/P MOHS PROCDURE W/ SLN BX----   NO RECURRENCE  . Myoepithelial carcinoma of parotid gland 2008    [place] GLAND --  X35  RADIATION [place]  COMPLETED AUGUST 2008--   NO RECURRENCE   Past Surgical History  Procedure Laterality Date  . Kidney surgery  1966    BILATERAL URETER'S DILATATION  . Melanoma  excision with sentinel lymph node biopsy  2001    moh's procedure/  RIGHT FOREHEAD AND UPPER EYEBROW  . Lumbar laminectomy/decompression microdiscectomy  05/18/2011    Procedure: LUMBAR LAMINECTOMY/DECOMPRESSION MICRODISCECTOMY;  Surgeon: Johnn Hai;  Location: [facility];  Service: Orthopedics;  Laterality: Right;  Decompression Lumbar 4-Lumbar 5  Right    (xray)   . Cystoscopy with retrograde pyelogram, ureteroscopy and stent placement Bilateral 03/19/2013    Procedure: CYSTOSCOPY WITH RETROGRADE PYELOGRAM, BILATERAL URETEROSCOPY AND STENT PLACEMENT LEFT URETER,BILATERAL STONE EXTRACTION , HOLMIUM LASER LEFT URETER;  Surgeon: Molli Hazard, MD;  Location: [facility];  Service: Urology;  Laterality: Bilateral;  . Right lateral parotidectomy w/ nerve dissection / right modified radical neck dissection sparing scm eleventh nerve and internal jugular vein  09-12-2006  DR DWIGHT BATES    DR DWIGHT BATES; "inside gland; lots of lymph nodes"  . Cardiovascular stress test  03-21-2012  DR [facility]    NORMAL NUCLEAR STUDY/  NO ISCHEMIA/  EF 63%  . Cystoscopy w/ ureteral stent placement Left 03/26/2013    Procedure: CYSTOSCOPY WITH RETROGRADE PYELOGRAM ;  Surgeon: Molli Hazard, MD;  Location: [facility];  Service: Urology;  Laterality: Left;  . Cystoscopy with stent placement Left 03/26/2013    Procedure: CYSTOSCOPY WITH STENT PLACEMENT;  Surgeon: Molli Hazard,  MD;  Location: [place];  Service: Urology;  Laterality: Left;  . Cardiac catheterization  2006 (APPROX)  [place]    NORMAL  . Cardiac catheterization  12/30/2014  . Back surgery    . Breast biopsy Bilateral early 2000's  . Diagnostic laparoscopy  04-12-2009  . Cardiac catheterization N/A 12/30/2014    Procedure: Left Heart Cath and Coronary Angiography;  Surgeon: Wellington Hampshire, MD;  Location: [place] [facility];  Service: Cardiovascular;  Laterality: N/A;   Family History  Problem  Relation Age of Onset  . Hypertension Mother   . COPD Mother   . Cancer Mother     breast  . Dementia Mother   . Heart disease Father   . Cancer Father     Colorectal  . Hyperlipidemia Father   . Hypertension Father    History  Substance Use Topics  . Smoking status: Never Smoker   . Smokeless tobacco: Never Used  . Alcohol Use: Yes     Comment: 12/30/2014 "might have a beer or glass of wine maybe once/month"   OB History    No data available     Review of Systems  Respiratory: Positive for chest tightness and shortness of breath.   Cardiovascular: Positive for palpitations.  Neurological: Positive for dizziness.  All other systems reviewed and are negative.     Allergies  Cephalexin  Home Medications   Prior to Admission medications   Medication Sig Start Date End Date Taking? Authorizing Provider  aspirin-acetaminophen-caffeine (EXCEDRIN MIGRAINE) [phone] MG per tablet Take 2 tablets by mouth daily.    Yes Historical Provider, MD  atorvastatin (LIPITOR) 20 MG tablet Take 1 tablet (20 mg total) by mouth daily. 12/31/14  Yes Bhavinkumar Bhagat, PA  Calcium Carb-Cholecalciferol (CALCIUM 600 + D) 600-200 MG-UNIT TABS Take 1 tablet by mouth daily.   Yes Historical Provider, MD  carvedilol (COREG) 3.125 MG tablet Take 1 tablet (3.125 mg total) by mouth 2 (two) times daily with a meal. 12/31/14  Yes Bhavinkumar Bhagat, PA  lisinopril (PRINIVIL,ZESTRIL) 2.5 MG tablet Take 1 tablet (2.5 mg total) by mouth daily. 12/31/14  Yes Bhavinkumar Bhagat, PA  Multiple Vitamin (MULTIVITAMIN) tablet Take 1 tablet by mouth daily.   Yes Historical Provider, MD  ondansetron (ZOFRAN-ODT) 8 MG disintegrating tablet Take 1 tablet (8 mg total) by mouth every 6 (six) hours as needed for nausea or vomiting. 11/09/14  Yes Claretta Fraise, MD  clonazepam (KLONOPIN) 0.125 MG disintegrating tablet Take 0.125 mg by mouth daily as needed (for anxiety).  12/28/14   Historical Provider, MD  ketotifen (THERA  TEARS ALLERGY) 0.025 % ophthalmic solution Apply 1 drop to eye daily as needed (for dry eye relief).    Historical Provider, MD  LINZESS 145 MCG CAPS capsule Take 145 mcg by mouth daily. 12/10/14   Historical Provider, MD   BP 106/51 mmHg  Pulse 63  Temp(Src) 98.3 F (36.8 C) (Oral)  Resp 15  Ht 5\' 4"  (1.626 m)  Wt 172 lb (78.019 kg)  BMI 29.51 kg/m2  SpO2 97%  LMP 05/18/2011 Physical Exam  Constitutional: She is oriented to person, place, and time. She appears well-developed and well-nourished.  HENT:  Head: Normocephalic and atraumatic.  Eyes: EOM are normal. Pupils are equal, round, and reactive to light.  Neck: Neck supple.  Cardiovascular: Normal rate, regular rhythm and normal heart sounds.   Pulmonary/Chest: Effort normal. No respiratory distress.  Abdominal: Soft. She exhibits no distension and no mass. There  is tenderness. There is no rebound and no guarding.  L sided abd tenderness  Neurological: She is alert and oriented to person, place, and time.  Skin: Skin is warm and dry.  Nursing note and vitals reviewed.   ED Course  Procedures (including critical care time) Labs Review Labs Reviewed  BASIC METABOLIC PANEL - Abnormal; Notable for the following:    Chloride 99 (*)    All other components within normal limits  CBC  I-STAT TROPOININ, ED    Imaging Review Dg Chest 2 View  01/05/2015   CLINICAL DATA:  Mid sternal chest pain, feels like her heart is going to jump out of her chest, palpitations, nausea, dizziness and diaphoresis today, history MI  EXAM: CHEST  2 VIEW  COMPARISON:  12/29/2014  FINDINGS: Normal heart size, mediastinal contours, and pulmonary vascularity.  Lungs clear.  No pleural effusion or pneumothorax.  Bones unremarkable.  IMPRESSION: No acute abnormalities.   Electronically Signed   By: Lavonia Dana M.D.   On: 01/05/2015 14:23     EKG Interpretation   Date/Time:  Tuesday January 05 2015 13:47:05 EDT Ventricular Rate:  80 PR Interval:   136 QRS Duration: 78 QT Interval:  378 QTC Calculation: 435 R Axis:   -56 Text Interpretation:  Normal sinus rhythm Left axis deviation Low voltage  QRS Cannot rule out Anterior infarct , age undetermined T wave  abnormality, consider inferolateral ischemia Abnormal ECG No significant  change was found Reconfirmed by CAMPOS  MD, Lennette Bihari ([id number]) on 01/05/2015  2:44:58 PM        MDM   Final diagnoses:  Near syncope  Palpitations  Cardiomyopathy    DDx includes: Orthostatic hypotension Stroke Vertebral artery dissection/stenosis Dysrhythmia PE Vasovagal/neurocardiogenic syncope Aortic stenosis Valvular disorder/Cardiomyopathy Anemia  Pt comes in with cc of near syncope. She had a recent NSTEMI - EF IS 35%. She had some palpitations, diophoresis, nausea and shortness of breath associated with the episode - all concerning constitutionals, and knowing that Takotsobu cardiomyopathy had known increased adverse effects in the 1st 30 days, we will admit.  Cards consulted as well per hospitalist request.    Varney Biles, MD 01/05/15 [phone]

## 2015-01-05 NOTE — Consult Note (Signed)
CARDIOLOGY CONSULT NOTE   Patient ID: SHAILA GILCHREST MRN: [id number], DOB/AGE: Sep 21, 1960   Admit date: 01/05/2015 Date of Consult: 01/05/2015   Primary Physician: Claretta Fraise, MD Primary Cardiologist: Dr. Johnsie Cancel  Pt. Profile  54 year old Caucasian female with PMH of chest pain with negative cardiac cath, GERD, L renal calculi, history of melanoma of parotic gland, anxiety and depression present with palpitation, nausea, dizziness and diaphoresis around 4AM on 01/05/2015  Problem List  Past Medical History  Diagnosis Date  . PONV (postoperative nausea and vomiting)   . Left ureteral calculus   . History of palpitations     STRESS INDUCED  . Kidney stones   . Kidney cysts     "I think it was right"  . Hypercholesterolemia dx'd 12/2014  . Childhood asthma   . NSTEMI (non-ST elevated myocardial infarction) 12/28/2014  . GERD (gastroesophageal reflux disease)     "I don't take RX for it" (12/30/2014)  . Daily headache   . Arthritis     back (12/30/2014)  . Anxiety   . Depression   . Malignant melanoma of skin of eyebrow 2001     RIGHT SUPRAORBITAL (RIGHT FOREHEAD AND UPPER EYELID ----  S/P MOHS PROCDURE W/ SLN BX----   NO RECURRENCE  . Myoepithelial carcinoma of parotid gland 2008    [place] GLAND --  X35  RADIATION [place]  COMPLETED AUGUST 2008--   NO RECURRENCE    Past Surgical History  Procedure Laterality Date  . Kidney surgery  1966    BILATERAL URETER'S DILATATION  . Melanoma excision with sentinel lymph node biopsy  2001    moh's procedure/  RIGHT FOREHEAD AND UPPER EYEBROW  . Lumbar laminectomy/decompression microdiscectomy  05/18/2011    Procedure: LUMBAR LAMINECTOMY/DECOMPRESSION MICRODISCECTOMY;  Surgeon: Johnn Hai;  Location: [facility];  Service: Orthopedics;  Laterality: Right;  Decompression Lumbar 4-Lumbar 5  Right    (xray)   . Cystoscopy with retrograde pyelogram, ureteroscopy and stent placement Bilateral 03/19/2013    Procedure:  CYSTOSCOPY WITH RETROGRADE PYELOGRAM, BILATERAL URETEROSCOPY AND STENT PLACEMENT LEFT URETER,BILATERAL STONE EXTRACTION , HOLMIUM LASER LEFT URETER;  Surgeon: Molli Hazard, MD;  Location: [facility];  Service: Urology;  Laterality: Bilateral;  . Right lateral parotidectomy w/ nerve dissection / right modified radical neck dissection sparing scm eleventh nerve and internal jugular vein  09-12-2006  DR DWIGHT BATES    DR DWIGHT BATES; "inside gland; lots of lymph nodes"  . Cardiovascular stress test  03-21-2012  DR [facility]    NORMAL NUCLEAR STUDY/  NO ISCHEMIA/  EF 63%  . Cystoscopy w/ ureteral stent placement Left 03/26/2013    Procedure: CYSTOSCOPY WITH RETROGRADE PYELOGRAM ;  Surgeon: Molli Hazard, MD;  Location: [facility];  Service: Urology;  Laterality: Left;  . Cystoscopy with stent placement Left 03/26/2013    Procedure: CYSTOSCOPY WITH STENT PLACEMENT;  Surgeon: Molli Hazard, MD;  Location: [facility];  Service: Urology;  Laterality: Left;  . Cardiac catheterization  2006 (APPROX)  [place]    NORMAL  . Cardiac catheterization  12/30/2014  . Back surgery    . Breast biopsy Bilateral early 2000's  . Diagnostic laparoscopy  04-12-2009  . Cardiac catheterization N/A 12/30/2014    Procedure: Left Heart Cath and Coronary Angiography;  Surgeon: Wellington Hampshire, MD;  Location: [place] [facility];  Service: Cardiovascular;  Laterality: N/A;     Allergies  Allergies  Allergen Reactions  .  Cephalexin Shortness Of Breath and Rash    HPI   The patient is a pleasant 54 year old Caucasian female with PMH of chest pain with negative cardiac cath, GERD, L renal calculi, history of melanoma of parotic gland, anxiety and depression. She had a negative cardiac catheterization around 2005 at [facility] despite chronic TWI in anterior lead. She had a negative Myoview in October 2013. She was recently at admitted last week for CP after she became  very upset with her boss. She also had palpitation shortness breath as a time. Troponin was mildly elevated at 0.34. EKG showed chronic T-wave inversion in the anterior lead, however new T wave inversion in the inferior lead as well. She was seen at [facility] and transferred to [facility] as NSTEMI for cardiac catheterization. Cardiac catheterization performed on 12/30/2014 showed normal coronary arteries, moderately to severely reduced LV function with EF 30-35% with severe hypokinesis of mid distal anterior, apical, mid and distal inferior wall consistent with stress-induced cardiomyopathy. She was started on a low-dose Coreg 3.125 mg BID and 2.5 mg daily of lisinopril. After discharge, she initially did well without recurrence of chest pain. She has been compliant with her medication at home.  She woke up around 4 AM in the morning of 01/05/2015 with palpitation, nausea, dizziness and diaphoresis. She states she did not sleep very well last night. She continue to have some degree of anxiety due to recent event. She was sitting in the chair when she started having symptoms. She also had a short episode of left flank discomfort lasted about 60 seconds this morning. According to the patient, this is unusual for her as she usually have right abdominal pain due to history of gallbladder issues. She states the symptoms she felt this morning was new for her. She denies any chest pain this morning and no significant shortness of breath. She was admitted to IM service. Cardiology has been consulted for presyncope. Of note, during the interview, when the patient sat up, she had a recurrent episode of left flank pain with changing body position.   No current facility-administered medications on file prior to encounter.   Current Outpatient Prescriptions on File Prior to Encounter  Medication Sig Dispense Refill  . aspirin-acetaminophen-caffeine (EXCEDRIN MIGRAINE) 250-250-65 MG per tablet Take 2  tablets by mouth daily.     Marland Kitchen atorvastatin (LIPITOR) 20 MG tablet Take 1 tablet (20 mg total) by mouth daily. 30 tablet 11  . carvedilol (COREG) 3.125 MG tablet Take 1 tablet (3.125 mg total) by mouth 2 (two) times daily with a meal. 60 tablet 11  . lisinopril (PRINIVIL,ZESTRIL) 2.5 MG tablet Take 1 tablet (2.5 mg total) by mouth daily. 30 tablet 11  . ondansetron (ZOFRAN-ODT) 8 MG disintegrating tablet Take 1 tablet (8 mg total) by mouth every 6 (six) hours as needed for nausea or vomiting. 20 tablet 1  . ketotifen (THERA TEARS ALLERGY) 0.025 % ophthalmic solution Apply 1 drop to eye daily as needed (for dry eye relief).       Family History Family History  Problem Relation Age of Onset  . Hypertension Mother   . COPD Mother   . Cancer Mother     breast  . Dementia Mother   . Heart disease Father   . Cancer Father     Colorectal  . Hyperlipidemia Father   . Hypertension Father      Social History History   Social History  . Marital Status: Married  Spouse Name: N/A  . Number of Children: N/A  . Years of Education: N/A   Occupational History  . Not on file.   Social History Main Topics  . Smoking status: Never Smoker   . Smokeless tobacco: Never Used  . Alcohol Use: Yes     Comment: 12/30/2014 "might have a beer or glass of wine maybe once/month"  . Drug Use: No  . Sexual Activity: Not Currently   Other Topics Concern  . Not on file   Social History Narrative     Review of Systems  General:  No chills, fever, night sweats or weight changes.  Cardiovascular:  No chest pain, dyspnea on exertion, edema, orthopnea, paroxysmal nocturnal dyspnea. Dermatological: No rash, lesions/masses Respiratory: No cough, dyspnea Urologic: No hematuria, dysuria Abdominal:   No vomiting, diarrhea, bright red blood per rectum, melena, or hematemesis. L flank pain. Neurologic:  No visual changes, changes in mental status. +dizziness, diaphoresis, and nausea All other systems  reviewed and are otherwise negative except as noted above.  Physical Exam  Blood pressure 106/51, pulse 67, temperature 98.3 F (36.8 C), temperature source Oral, resp. rate 17, height 5\' 4"  (1.626 m), weight 172 lb (78.019 kg), last menstrual period 05/18/2011, SpO2 96 %.  General: Pleasant, NAD Psych: Normal affect. Neuro: Alert and oriented X 3. Moves all extremities spontaneously. HEENT: Normal  Neck: Supple without bruits or JVD. Lungs:  Resp regular and unlabored, CTA. Heart: RRR no s3, s4, or murmurs. Abdomen: Soft, non-tender, non-distended, BS + x 4.  Extremities: No clubbing, cyanosis or edema. DP/PT/Radials 2+ and equal bilaterally.  Labs  No results for input(s): CKTOTAL, CKMB, TROPONINI in the last 72 hours. Lab Results  Component Value Date   WBC 4.8 01/05/2015   HGB 14.0 01/05/2015   HCT 39.6 01/05/2015   MCV 89.4 01/05/2015   PLT 187 01/05/2015    Recent Labs Lab 01/05/15 1400  NA 136  K 4.4  CL 99*  CO2 26  BUN 18  CREATININE 0.97  CALCIUM 9.8  GLUCOSE 92   Lab Results  Component Value Date   CHOL 239* 12/31/2014   HDL 46 12/31/2014   LDLCALC 172* 12/31/2014   TRIG 106 12/31/2014    Radiology/Studies  Dg Chest 2 View  01/05/2015   CLINICAL DATA:  Mid sternal chest pain, feels like her heart is going to jump out of her chest, palpitations, nausea, dizziness and diaphoresis today, history MI  EXAM: CHEST  2 VIEW  COMPARISON:  12/29/2014  FINDINGS: Normal heart size, mediastinal contours, and pulmonary vascularity.  Lungs clear.  No pleural effusion or pneumothorax.  Bones unremarkable.  IMPRESSION: No acute abnormalities.   Electronically Signed   By: Lavonia Dana M.D.   On: 01/05/2015 14:23   Dg Chest Port 1 View  12/29/2014   CLINICAL DATA:  54 year old female with chest pain. History of melanoma 2001 post excision. Subsequent encounter.  EXAM: PORTABLE CHEST - 1 VIEW  COMPARISON:  10/22/2014 chest x-ray.  FINDINGS: No infiltrate, congestive heart  failure or pneumothorax.  No plain film evidence pulmonary malignancy.  Remote right clavicle fracture.  Central pulmonary vascular prominence stable.  Heart size within normal limits.  IMPRESSION: No acute abnormality.  Please see above.   Electronically Signed   By: Genia Del M.D.   On: 12/29/2014 15:08    ECG  Normal sinus rhythm with T-wave inversion in the inferior and anterior lead. Unchanged when compared to last week's EKG.  ASSESSMENT AND PLAN  1. Presyncope: unclear cause, hypotension vs anxiety  - unlikely to be cardiac in origin given recent cath, monitor overnight, if no arrhythmia on telemetry and no recurrent symptom, would not do further workup  2. Recent Takotsubo cardiomyopathy  - Cath 12/30/2014 normal coronary arteries, moderately to severely reduced LV function with EF 30-35% with severe hypokinesis of mid distal anterior, apical, mid and distal inferior wall consistent with stress-induced cardiomyopathy  - continue coreg and lisinopril. Will recheck Echo, expect improvement in EF after 1 week  3. L flank pain: more likely to be musculoskeletal vs pleuritic. Per pt, only had 60 secs of symptom this morning. Had recurrent pain when she sat up  - given h/o L renal calculi, will check UA  4. GERD 5. history of melanoma of parotic gland 6. anxiety and depression   Signed, Almyra Deforest, Hershal Coria 01/05/2015, 5:07 PM  Patient examined chart reviewed very atypical symptoms. No signs of CHF/  No signs of arrhythmia on telemetry.  Seems to have a stictch under left ribs.  Likely related to anxiety.  Observe on telemetry update echo.  Exam is benign With no postural drop in systolic BP no abdominal pain and clear lung fields.  Dizzyness sounds like possible vertigo But now resolved.  Check UA  Jenkins Rouge

## 2015-01-05 NOTE — ED Notes (Signed)
Cardiology PA at bedside. 

## 2015-01-05 NOTE — H&P (Signed)
[facility] History and Physical                                                                                    [name], is a 54 y.o. female  MRN: [id number]   DOB - 06-Aug-1960  Admit Date - 01/05/2015  Outpatient Primary MD for the patient is Claretta Fraise, MD  Referring MD: Kathrynn Humble / ER  Consulting MD: [facility]  With History of -  Past Medical History  Diagnosis Date  . PONV (postoperative nausea and vomiting)   . Left ureteral calculus   . History of palpitations     STRESS INDUCED  . Kidney stones   . Kidney cysts     "I think it was right"  . Hypercholesterolemia dx'd 12/2014  . Childhood asthma   . NSTEMI (non-ST elevated myocardial infarction) 12/28/2014  . GERD (gastroesophageal reflux disease)     "I don't take RX for it" (12/30/2014)  . Daily headache   . Arthritis     back (12/30/2014)  . Anxiety   . Depression   . Malignant melanoma of skin of eyebrow 2001     RIGHT SUPRAORBITAL (RIGHT FOREHEAD AND UPPER EYELID ----  S/P MOHS PROCDURE W/ SLN BX----   NO RECURRENCE  . Myoepithelial carcinoma of parotid gland 2008    [facility] GLAND --  X35  RADIATION TX  COMPLETED AUGUST 2008--   NO RECURRENCE      Past Surgical History  Procedure Laterality Date  . Kidney surgery  1966    BILATERAL URETER'S DILATATION  . Melanoma excision with sentinel lymph node biopsy  2001    moh's procedure/  RIGHT FOREHEAD AND UPPER EYEBROW  . Lumbar laminectomy/decompression microdiscectomy  05/18/2011    Procedure: LUMBAR LAMINECTOMY/DECOMPRESSION MICRODISCECTOMY;  Surgeon: Johnn Hai;  Location: [facility];  Service: Orthopedics;  Laterality: Right;  Decompression Lumbar 4-Lumbar 5  Right    (xray)   . Cystoscopy with retrograde pyelogram, ureteroscopy and stent placement Bilateral 03/19/2013    Procedure: CYSTOSCOPY WITH RETROGRADE PYELOGRAM, BILATERAL URETEROSCOPY AND STENT PLACEMENT LEFT URETER,BILATERAL STONE EXTRACTION , HOLMIUM  LASER LEFT URETER;  Surgeon: Molli Hazard, MD;  Location: [facility];  Service: Urology;  Laterality: Bilateral;  . Right lateral parotidectomy w/ nerve dissection / right modified radical neck dissection sparing scm eleventh nerve and internal jugular vein  09-12-2006  DR DWIGHT BATES    DR DWIGHT BATES; "inside gland; lots of lymph nodes"  . Cardiovascular stress test  03-21-2012  DR [facility] [place]    NORMAL NUCLEAR STUDY/  NO ISCHEMIA/  EF 63%  . Cystoscopy w/ ureteral stent placement Left 03/26/2013    Procedure: CYSTOSCOPY WITH RETROGRADE PYELOGRAM ;  Surgeon: Molli Hazard, MD;  Location: [facility];  Service: Urology;  Laterality: Left;  . Cystoscopy with stent placement Left 03/26/2013    Procedure: CYSTOSCOPY WITH STENT PLACEMENT;  Surgeon: Molli Hazard, MD;  Location: [facility]  Service: Urology;  Laterality: Left;  . Cardiac catheterization  2006 (APPROX)  [place]    NORMAL  . Cardiac catheterization  12/30/2014  . Back surgery    .  Breast biopsy Bilateral early 2000's  . Diagnostic laparoscopy  04-12-2009  . Cardiac catheterization N/A 12/30/2014    Procedure: Left Heart Cath and Coronary Angiography;  Surgeon: Wellington Hampshire, MD;  Location: [facility];  Service: Cardiovascular;  Laterality: N/A;    in for   Chief Complaint  Patient presents with  . Palpitations     HPI This is a 54 year female patient with past medical history of childhood asthma, stress induced palpitations, kidney stones, arthritis, prior treatment of melanoma and parotid gland cancer who was recently discharged on 7/14 after presenting with chest pain and acute systolic heart failure secondary to Takotsubo syndrome. During that admission she underwent cardiac catheterization which revealed normal coronaries. She only had minimal peak in her troponins at 0.34. Ventriculogram during the catheterization revealed an EF of 30-35% with severe hypokinesis  of the mid distal anterior, apical and mid to distal inferior walls consistent with stress-induced cardiomyopathy. During the hospitalization patient was started on statin, carvedilol, and lisinopril. She returns to the hospital today complaining of waking up in the night and noticing that she was having midsternal chest discomfort associated with diaphoresis and nausea. When she got up to walk she felt dizzy and faint. This prompted the patient to go lay back down in the bed. She eventually was able to sit up and symptoms improved somewhat. She was able to walk her dog and eat breakfast after taking Zofran. Unfortunately she continued to feel weak and continued to have midsternal chest pain and a sensation of palpitations. She eventually called the cardiologist office who instructed her to report to the ER for further evaluation. In addition to the above symptoms she had a colicky type left upper quadrant pain that was very severe in nature. This was not associated with any emesis or diarrhea. Patient reports she did take some antibiotics about a month ago. Patient reports she typically gets right-sided abdominal pain in the setting of known biliary dyskinesia.  In the ER patient was afebrile, BP is 116/68, pulse was 80 and respirations were 16. Room air saturations were 95%. EKG was unremarkable and showed persistent pain and T-wave inversion consistent with EKG prior to discharge. Troponin has normalized. Laboratory data otherwise unremarkable. Platelets of also normalized noting they were 124,000 prior to discharge and today her 187,000. Chest x-ray was unremarkable.    Review of Systems   In addition to the HPI above,  No Fever-chills, myalgias or other constitutional symptoms No Headache, changes with Vision or hearing, new weakness, tingling, numbness in any extremity, No problems swallowing food or Liquids, indigestion/reflux No Cough or Shortness of Breath, orthopnea or DOE No melena or  hematochezia, no dark tarry stools, as chronic constipation No dysuria, hematuria or flank pain No new skin rashes, lesions, masses or bruises, No new joints pains-aches No recent weight gain or loss No polyuria, polydypsia or polyphagia,  *A full 10 point Review of Systems was done, except as stated above, all other Review of Systems were negative.  Social History History  Substance Use Topics  . Smoking status: Never Smoker   . Smokeless tobacco: Never Used  . Alcohol Use: Yes     Comment: 12/30/2014 "might have a beer or glass of wine maybe once/month"    Resides at: Private residence  Lives with: Husband  Ambulatory status: Without assistive devices   Family History Family History  Problem Relation Age of Onset  . Hypertension Mother   . COPD Mother   .  Cancer Mother     breast  . Dementia Mother   . Heart disease Father   . Cancer Father     Colorectal  . Hyperlipidemia Father   . Hypertension Father      Prior to Admission medications   Medication Sig Start Date End Date Taking? Authorizing Provider  aspirin-acetaminophen-caffeine (EXCEDRIN MIGRAINE) [phone] MG per tablet Take 2 tablets by mouth daily.    Yes Historical Provider, MD  atorvastatin (LIPITOR) 20 MG tablet Take 1 tablet (20 mg total) by mouth daily. 12/31/14  Yes Bhavinkumar Bhagat, PA  Calcium Carb-Cholecalciferol (CALCIUM 600 + D) 600-200 MG-UNIT TABS Take 1 tablet by mouth daily.   Yes Historical Provider, MD  carvedilol (COREG) 3.125 MG tablet Take 1 tablet (3.125 mg total) by mouth 2 (two) times daily with a meal. 12/31/14  Yes Bhavinkumar Bhagat, PA  lisinopril (PRINIVIL,ZESTRIL) 2.5 MG tablet Take 1 tablet (2.5 mg total) by mouth daily. 12/31/14  Yes Bhavinkumar Bhagat, PA  Multiple Vitamin (MULTIVITAMIN) tablet Take 1 tablet by mouth daily.   Yes Historical Provider, MD  ondansetron (ZOFRAN-ODT) 8 MG disintegrating tablet Take 1 tablet (8 mg total) by mouth every 6 (six) hours as needed for  nausea or vomiting. 11/09/14  Yes Claretta Fraise, MD  clonazepam (KLONOPIN) 0.125 MG disintegrating tablet Take 0.125 mg by mouth daily as needed (for anxiety).  12/28/14   Historical Provider, MD  ketotifen (THERA TEARS ALLERGY) 0.025 % ophthalmic solution Apply 1 drop to eye daily as needed (for dry eye relief).    Historical Provider, MD  LINZESS 145 MCG CAPS capsule Take 145 mcg by mouth daily. 12/10/14   Historical Provider, MD    Allergies  Allergen Reactions  . Cephalexin Shortness Of Breath and Rash    Physical Exam  Vitals  Blood pressure 106/51, pulse 66, temperature 98.3 F (36.8 C), temperature source Oral, resp. rate 22, height 5\' 4"  (1.626 m), weight 172 lb (78.019 kg), last menstrual period 05/18/2011, SpO2 98 %.   General:  In no acute distress, appears healthy and well nourished  Psych:  Normal affect, Denies Suicidal or Homicidal ideations, Awake Alert, Oriented X 3. Speech and thought patterns are clear and appropriate, no apparent short term memory deficits  Neuro:   No focal neurological deficits, CN II through XII intact, Strength 5/5 all 4 extremities, Sensation intact all 4 extremities.  ENT:  Ears and Eyes appear Normal, Conjunctivae clear, PER. Moist oral mucosa without erythema or exudates.  Neck:  Supple, No lymphadenopathy appreciated  Respiratory:  Symmetrical chest wall movement, Good air movement bilaterally, CTAB. Room Air  Cardiac:  RRR, No Murmurs, no LE edema noted, no JVD, No carotid bruits, peripheral pulses palpable at 2+  Abdomen:  Positive bowel sounds, Soft, minimally tender left upper quadrant, Non distended,  No masses appreciated, no obvious hepatosplenomegaly  Skin:  No Cyanosis, Normal Skin Turgor, No Skin Rash or Bruise.  Extremities: Symmetrical without obvious trauma or injury,  no effusions.  Data Review  CBC  Recent Labs Lab 12/29/14 2300 12/30/14 0421 12/31/14 0316 01/05/15 1400  WBC 4.0 3.3* 3.0* 4.8  HGB 12.5 11.7*  10.9* 14.0  HCT 35.8* 33.8* 31.9* 39.6  PLT 136* 123* 124* 187  MCV 89.3 89.2 90.1 89.4  MCH 31.2 30.9 30.8 31.6  MCHC 34.9 34.6 34.2 35.4  RDW 12.9 12.8 12.9 12.5  LYMPHSABS 1.6  --   --   --   MONOABS 0.3  --   --   --  EOSABS 0.1  --   --   --   BASOSABS 0.0  --   --   --     Chemistries   Recent Labs Lab 12/29/14 2300 12/31/14 0316 01/05/15 1400  NA 141 141 136  K 3.8 3.9 4.4  CL 107 108 99*  CO2 27 27 26   GLUCOSE 122* 105* 92  BUN 11 11 18   CREATININE 0.78 0.78 0.97  CALCIUM 8.9 8.4* 9.8  MG 2.2  --   --     estimated creatinine clearance is 65.4 mL/min (by C-G formula based on Cr of 0.97).  No results for input(s): TSH, T4TOTAL, T3FREE, THYROIDAB in the last 72 hours.  Invalid input(s): FREET3  Coagulation profile  Recent Labs Lab 12/29/14 2300 12/30/14 0421  INR 1.02 1.10    No results for input(s): DDIMER in the last 72 hours.  Cardiac Enzymes  Recent Labs Lab 12/29/14 2300 12/30/14 0421 12/30/14 1437  TROPONINI 0.25* 0.16* 0.09*    Invalid input(s): POCBNP  Urinalysis    Component Value Date/Time   COLORURINE AMBER* 03/08/2013 1521   APPEARANCEUR CLOUDY* 03/08/2013 1521   LABSPEC 1.025 03/08/2013 1521   PHURINE 6.0 03/08/2013 1521   GLUCOSEU NEGATIVE 03/08/2013 1521   HGBUR LARGE* 03/08/2013 1521   BILIRUBINUR SMALL* 03/08/2013 1521   BILIRUBINUR color interferenc 02/27/2013 0858   KETONESUR NEGATIVE 03/08/2013 1521   PROTEINUR 100* 03/08/2013 1521   PROTEINUR color interferenc 02/27/2013 0858   UROBILINOGEN 0.2 03/08/2013 1521   UROBILINOGEN negative 02/24/2013 1706   NITRITE NEGATIVE 03/08/2013 1521   NITRITE color interferenc 02/27/2013 0858   LEUKOCYTESUR TRACE* 03/08/2013 1521    Imaging results:   Dg Chest 2 View  01/05/2015   CLINICAL DATA:  Mid sternal chest pain, feels like her heart is going to jump out of her chest, palpitations, nausea, dizziness and diaphoresis today, history MI  EXAM: CHEST  2 VIEW   COMPARISON:  12/29/2014  FINDINGS: Normal heart size, mediastinal contours, and pulmonary vascularity.  Lungs clear.  No pleural effusion or pneumothorax.  Bones unremarkable.  IMPRESSION: No acute abnormalities.   Electronically Signed   By: Lavonia Dana M.D.   On: 01/05/2015 14:23   Dg Chest Port 1 View  12/29/2014   CLINICAL DATA:  54 year old female with chest pain. History of melanoma 2001 post excision. Subsequent encounter.  EXAM: PORTABLE CHEST - 1 VIEW  COMPARISON:  10/22/2014 chest x-ray.  FINDINGS: No infiltrate, congestive heart failure or pneumothorax.  No plain film evidence pulmonary malignancy.  Remote right clavicle fracture.  Central pulmonary vascular prominence stable.  Heart size within normal limits.  IMPRESSION: No acute abnormality.  Please see above.   Electronically Signed   By: Genia Del M.D.   On: 12/29/2014 15:08     EKG: (Independently reviewed) sinus rhythm with diffuse T-wave inversions similar to previous EKG   Assessment & Plan  Principal Problem:   Near syncope -Admit to telemetry -Etiology uncertain but differential includes with orthostasis (new start anti-hypertensive medications) versus arrhythmia and setting of EF 30-35% (patient reported sensation of palpitations) -Cardiology consulted -Has not had any arrhythmias and the 2 hour she has been in the ER -Had cardiac catheterization last admission-will not pursue echocardiogram unless cardiology feels is warranted -Check orthostatic vital signs -Neuro checks every 4 hours  Active Problems:   Recent NSTEMI 2/2 Takotsubo syndrome -Continue preadmission medications including ACE inhibitor and combination beta alpha blocker unless proven to be orthostatic -Enzymes have normalized -Recommend continued  outpatient cardiac follow-up as scheduled    Chronic systolic congestive heart failure, NYHA class 2/EF 30-35% -Currently no signs or symptoms of decompensated heart failure -Continue preadmission  medications -Did not require diuretic during last admission or at discharge    HTN  -Current blood pressure well controlled    Biliary dyskinesia -Patient reports EF 35% and therefore did not meet criteria for surgical intervention -Now experiencing some left upper quadrant abdominal pain so check lipase to rule out potential pancreatitis secondary to sludge    GERD  -Reports having issues with swallowing and EGD had been planned for next week with Dr. Cristina Gong but placed on hold due to recent NSTEMI    IBS  -Continue Linzess    DVT Prophylaxis: Lovenox  Family Communication:   No family at bedside  Code Status: Full code   Condition:  Stable  Discharge disposition: Anticipate discharge within next 24 hours pending near syncope evaluation; also dependent upon telemetry remaining clear of arrhythmia such as NSVT or AF  Time spent in minutes : 60      Erickson Yamashiro L. ANP on 01/05/2015 at 4:34 PM  Between 7am to 7pm - Pager - [phone]  After 7pm go to [email or web address] - password [email or web address]  And look for the night coverage person covering me after hours  [facility]

## 2015-01-06 ENCOUNTER — Observation Stay (HOSPITAL_COMMUNITY): Payer: BLUE CROSS/BLUE SHIELD

## 2015-01-06 DIAGNOSIS — R55 Syncope and collapse: Secondary | ICD-10-CM | POA: Diagnosis not present

## 2015-01-06 DIAGNOSIS — R002 Palpitations: Secondary | ICD-10-CM | POA: Diagnosis not present

## 2015-01-06 DIAGNOSIS — K589 Irritable bowel syndrome without diarrhea: Secondary | ICD-10-CM

## 2015-01-06 DIAGNOSIS — I255 Ischemic cardiomyopathy: Secondary | ICD-10-CM | POA: Diagnosis present

## 2015-01-06 DIAGNOSIS — I429 Cardiomyopathy, unspecified: Secondary | ICD-10-CM

## 2015-01-06 DIAGNOSIS — I951 Orthostatic hypotension: Secondary | ICD-10-CM | POA: Diagnosis not present

## 2015-01-06 DIAGNOSIS — K828 Other specified diseases of gallbladder: Secondary | ICD-10-CM | POA: Diagnosis not present

## 2015-01-06 LAB — CBC
HCT: 37.3 % (ref 36.0–46.0)
Hemoglobin: 12.9 g/dL (ref 12.0–15.0)
MCH: 31.4 pg (ref 26.0–34.0)
MCHC: 34.6 g/dL (ref 30.0–36.0)
MCV: 90.8 fL (ref 78.0–100.0)
Platelets: 160 10*3/uL (ref 150–400)
RBC: 4.11 MIL/uL (ref 3.87–5.11)
RDW: 12.6 % (ref 11.5–15.5)
WBC: 3.4 10*3/uL — ABNORMAL LOW (ref 4.0–10.5)

## 2015-01-06 LAB — URINALYSIS, ROUTINE W REFLEX MICROSCOPIC
Bilirubin Urine: NEGATIVE
Glucose, UA: NEGATIVE mg/dL
KETONES UR: NEGATIVE mg/dL
NITRITE: NEGATIVE
Protein, ur: NEGATIVE mg/dL
SPECIFIC GRAVITY, URINE: 1.009 (ref 1.005–1.030)
UROBILINOGEN UA: 0.2 mg/dL (ref 0.0–1.0)
pH: 6 (ref 5.0–8.0)

## 2015-01-06 LAB — BASIC METABOLIC PANEL
Anion gap: 8 (ref 5–15)
BUN: 16 mg/dL (ref 6–20)
CO2: 28 mmol/L (ref 22–32)
CREATININE: 0.94 mg/dL (ref 0.44–1.00)
Calcium: 9 mg/dL (ref 8.9–10.3)
Chloride: 100 mmol/L — ABNORMAL LOW (ref 101–111)
GFR calc Af Amer: >60 mL/min (ref >60)
GFR calc non Af Amer: >60 mL/min (ref >60)
Glucose, Bld: 91 mg/dL (ref 65–99)
Potassium: 4.1 mmol/L (ref 3.5–5.1)
SODIUM: 136 mmol/L (ref 135–145)

## 2015-01-06 LAB — TROPONIN I
Troponin I: <0.03 ng/mL (ref <0.031)
Troponin I: <0.03 ng/mL (ref <0.031)

## 2015-01-06 LAB — URINE MICROSCOPIC-ADD ON

## 2015-01-06 MED ORDER — LISINOPRIL 2.5 MG PO TABS: 2.5000 mg | ORAL_TABLET | Freq: Every day | ORAL | Status: DC

## 2015-01-06 NOTE — Progress Notes (Signed)
  Echocardiogram 2D Echocardiogram has been performed.  [name] 01/06/2015, 11:57 AM

## 2015-01-06 NOTE — Progress Notes (Signed)
PROGRESS NOTE  [name] ZSW:[id number] DOB: August 22, 1960 DOA: 01/05/2015 PCP: Claretta Fraise, MD  Assessment/Plan: Near syncope/Orthostatic hypotension -Orthostatic vitals positive with HR increasing by 27 beats/min and BP dropped from 118/70>>85/57 -continue tele -Appreciate Cardiology consult -defer any changes in carvedilol or lisinopril to Cardiology -Had cardiac catheterization last admission--patent coronaries   Recent Takotsubo syndrome -defer adjustment of cardiac meds to cardiology -Enzymes have normalized -repeat echo today   Chronic systolic congestive heart failure, NYHA class 2/EF 30-35% -Currently no signs or symptoms of decompensated heart failure -Continue preadmission medications -Did not require diuretic during last admission or at discharge   HTN  -Current blood pressure well controlled   Biliary dyskinesia -Patient reports EF 35% and therefore did not meet criteria for surgical intervention -Now experiencing some left upper quadrant abdominal pain so check lipase to rule out potential pancreatitis secondary to sludge   GERD  -Reports having issues with swallowing and EGD had been planned for next week with Dr. Cristina Gong but placed on hold due to recent Takatsubo's CM   IBS  -Off Linzess for over 2 weeks   Family Communication:   Pt at beside Disposition Plan:   Home 1-2 days         Procedures/Studies: Dg Chest 2 View  01/05/2015   CLINICAL DATA:  Mid sternal chest pain, feels like her heart is going to jump out of her chest, palpitations, nausea, dizziness and diaphoresis today, history MI  EXAM: CHEST  2 VIEW  COMPARISON:  12/29/2014  FINDINGS: Normal heart size, mediastinal contours, and pulmonary vascularity.  Lungs clear.  No pleural effusion or pneumothorax.  Bones unremarkable.  IMPRESSION: No acute abnormalities.   Electronically Signed   By: Lavonia Dana M.D.   On: 01/05/2015 14:23   Dg Chest Port 1 View  12/29/2014    CLINICAL DATA:  54 year old female with chest pain. History of melanoma 2001 post excision. Subsequent encounter.  EXAM: PORTABLE CHEST - 1 VIEW  COMPARISON:  10/22/2014 chest x-ray.  FINDINGS: No infiltrate, congestive heart failure or pneumothorax.  No plain film evidence pulmonary malignancy.  Remote right clavicle fracture.  Central pulmonary vascular prominence stable.  Heart size within normal limits.  IMPRESSION: No acute abnormality.  Please see above.   Electronically Signed   By: Genia Del M.D.   On: 12/29/2014 15:08         Subjective: Patient denies fevers, chills, headache, chest pain, dyspnea, nausea, vomiting, diarrhea, abdominal pain, dysuria, hematuria   Objective: Filed Vitals:   01/05/15 2000 01/05/15 2350 01/06/15 0400 01/06/15 0743  BP: 115/61 98/52 96/59    Pulse: 69 57 75   Temp: 98.1 F (36.7 C) 98 F (36.7 C) 97.8 F (36.6 C) 97.6 F (36.4 C)  TempSrc: Oral Oral Oral Oral  Resp: 18 18 18 18   Height:      Weight:   78.79 kg (173 lb 11.2 oz)   SpO2: 96% 96% 96% 98%    Intake/Output Summary (Last 24 hours) at 01/06/15 1118 Last data filed at 01/06/15 1100  Gross per 24 hour  Intake    240 ml  Output   1600 ml  Net  -1360 ml   Weight change:  Exam:   General:  Pt is alert, follows commands appropriately, not in acute distress  HEENT: No icterus, No thrush, No neck mass, [place]/AT  Cardiovascular: RRR, S1/S2, no rubs, no gallops  Respiratory: CTA bilaterally, no wheezing, no crackles, no  rhonchi  Abdomen: Soft/+BS, non tender, non distended, no guarding  Extremities: trace LE edema, No lymphangitis, No petechiae, No rashes, no synovitis  Data Reviewed: Basic Metabolic Panel:  Recent Labs Lab 12/31/14 0316 01/05/15 1400 01/06/15 0505  NA 141 136 136  K 3.9 4.4 4.1  CL 108 99* 100*  CO2 27 26 28   GLUCOSE 105* 92 91  BUN 11 18 16   CREATININE 0.78 0.97 0.94  CALCIUM 8.4* 9.8 9.0   Liver Function Tests:  Recent Labs Lab  01/05/15 1843  AST 25  ALT 19  ALKPHOS 52  BILITOT 0.7  PROT 7.2  ALBUMIN 4.3    Recent Labs Lab 01/05/15 1400  LIPASE 26   No results for input(s): AMMONIA in the last 168 hours. CBC:  Recent Labs Lab 12/31/14 0316 01/05/15 1400 01/06/15 0505  WBC 3.0* 4.8 3.4*  HGB 10.9* 14.0 12.9  HCT 31.9* 39.6 37.3  MCV 90.1 89.4 90.8  PLT 124* 187 160   Cardiac Enzymes:  Recent Labs Lab 12/30/14 1437 01/05/15 1843 01/05/15 2341 01/06/15 0505  TROPONINI 0.09* <0.03 <0.03 <0.03   BNP: Invalid input(s): POCBNP CBG: No results for input(s): GLUCAP in the last 168 hours.  No results found for this or any previous visit (from the past 240 hour(s)).   Scheduled Meds: . aspirin-acetaminophen-caffeine  2 tablet Oral Daily  . atorvastatin  20 mg Oral Daily  . calcium-vitamin D  1 tablet Oral Q breakfast  . carvedilol  3.125 mg Oral BID WC  . docusate sodium  100 mg Oral BID  . enoxaparin (LOVENOX) injection  40 mg Subcutaneous Q24H  . [START ON 01/07/2015] lisinopril  2.5 mg Oral QHS  . multivitamin with minerals  1 tablet Oral Daily  . sodium chloride  3 mL Intravenous Q12H   Continuous Infusions:    Alyra Patty, DO  [facility] Pager [phone]  If 7PM-7AM, please contact night-coverage [email or web address] Password [email or web address] 01/06/2015, 11:18 AM

## 2015-01-07 ENCOUNTER — Ambulatory Visit: Payer: BLUE CROSS/BLUE SHIELD | Admitting: Family Medicine

## 2015-01-07 DIAGNOSIS — I255 Ischemic cardiomyopathy: Secondary | ICD-10-CM

## 2015-01-07 DIAGNOSIS — K828 Other specified diseases of gallbladder: Secondary | ICD-10-CM

## 2015-01-07 DIAGNOSIS — I214 Non-ST elevation (NSTEMI) myocardial infarction: Secondary | ICD-10-CM

## 2015-01-07 DIAGNOSIS — I429 Cardiomyopathy, unspecified: Secondary | ICD-10-CM | POA: Insufficient documentation

## 2015-01-07 DIAGNOSIS — K589 Irritable bowel syndrome without diarrhea: Secondary | ICD-10-CM | POA: Diagnosis not present

## 2015-01-07 DIAGNOSIS — R55 Syncope and collapse: Secondary | ICD-10-CM | POA: Diagnosis not present

## 2015-01-07 DIAGNOSIS — I951 Orthostatic hypotension: Secondary | ICD-10-CM | POA: Diagnosis not present

## 2015-01-07 LAB — BASIC METABOLIC PANEL
ANION GAP: 6 (ref 5–15)
BUN: 14 mg/dL (ref 6–20)
CO2: 28 mmol/L (ref 22–32)
Calcium: 9.4 mg/dL (ref 8.9–10.3)
Chloride: 105 mmol/L (ref 101–111)
Creatinine, Ser: 0.93 mg/dL (ref 0.44–1.00)
GFR calc Af Amer: >60 mL/min (ref >60)
Glucose, Bld: 100 mg/dL — ABNORMAL HIGH (ref 65–99)
Potassium: 4.2 mmol/L (ref 3.5–5.1)
Sodium: 139 mmol/L (ref 135–145)

## 2015-01-07 LAB — GLUCOSE, CAPILLARY: GLUCOSE-CAPILLARY: 89 mg/dL (ref 65–99)

## 2015-01-07 MED ORDER — SENNA 8.6 MG PO TABS
2.0000 | ORAL_TABLET | Freq: Every day | ORAL | Status: DC
  Administered 2015-01-07: 17.2 mg via ORAL
  Filled 2015-01-07: qty 2

## 2015-01-07 NOTE — Discharge Instructions (Signed)

## 2015-01-07 NOTE — Progress Notes (Signed)
Subjective:  She says she still gets a little dizzy when she gets up to fast.  Objective:  Vital Signs in the last 24 hours: Temp:  [97.6 F (36.4 C)-98.4 F (36.9 C)] 97.7 F (36.5 C) (07/21 0427) Pulse Rate:  [62-92] 68 (07/21 0818) Resp:  [18] 18 (07/20 1645) BP: (93-122)/(47-77) 108/73 mmHg (07/21 0824) SpO2:  [96 %-99 %] 97 % (07/21 0427) Weight:  [175 lb 4.8 oz (79.516 kg)] 175 lb 4.8 oz (79.516 kg) (07/21 0427)  Intake/Output from previous day:  Intake/Output Summary (Last 24 hours) at 01/07/15 0842 Last data filed at 01/07/15 0700  Gross per 24 hour  Intake    480 ml  Output   1600 ml  Net  -1120 ml    Physical Exam: General appearance: alert, cooperative and no distress Neck: no JVD Lungs: clear to auscultation bilaterally Heart: regular rate and rhythm Extremities: no edema   Rate: 68  Rhythm: normal sinus rhythm  Lab Results:  Recent Labs  01/05/15 1400 01/06/15 0505  WBC 4.8 3.4*  HGB 14.0 12.9  PLT 187 160    Recent Labs  01/06/15 0505 01/07/15 0337  NA 136 139  K 4.1 4.2  CL 100* 105  CO2 28 28  GLUCOSE 91 100*  BUN 16 14  CREATININE 0.94 0.93    Recent Labs  01/05/15 2341 01/06/15 0505  TROPONINI <0.03 <0.03   No results for input(s): INR in the last 72 hours.  Scheduled Meds: . aspirin-acetaminophen-caffeine  2 tablet Oral Daily  . atorvastatin  20 mg Oral Daily  . calcium-vitamin D  1 tablet Oral Q breakfast  . carvedilol  3.125 mg Oral BID WC  . docusate sodium  100 mg Oral BID  . enoxaparin (LOVENOX) injection  40 mg Subcutaneous Q24H  . lisinopril  2.5 mg Oral QHS  . multivitamin with minerals  1 tablet Oral Daily  . sodium chloride  3 mL Intravenous Q12H   Continuous Infusions:  PRN Meds:.acetaminophen **OR** acetaminophen, alum & mag hydroxide-simeth, ketotifen, ondansetron, oxyCODONE   Imaging: Dg Chest 2 View  01/05/2015   CLINICAL DATA:  Mid sternal chest pain, feels like her heart is going to jump out  of her chest, palpitations, nausea, dizziness and diaphoresis today, history MI  EXAM: CHEST  2 VIEW  COMPARISON:  12/29/2014  FINDINGS: Normal heart size, mediastinal contours, and pulmonary vascularity.  Lungs clear.  No pleural effusion or pneumothorax.  Bones unremarkable.  IMPRESSION: No acute abnormalities.   Electronically Signed   By: Lavonia Dana M.D.   On: 01/05/2015 14:23    Cardiac Studies: Echo 01/06/15 Study Conclusions  - Left ventricle: The cavity size was normal. Systolic function was mildly reduced. The estimated ejection fraction was 45%. Diffuse hypokinesis. Doppler parameters are consistent with abnormal left ventricular relaxation (grade 1 diastolic dysfunction). There was no evidence of elevated ventricular filling pressure by Doppler parameters. - Aortic valve: Trileaflet; normal thickness leaflets. There was trivial regurgitation. - Aortic root: The aortic root was normal in size. - Mitral valve: Structurally normal valve. There was no regurgitation. - Left atrium: The atrium was normal in size. - Right ventricle: Systolic function was normal. - Right atrium: The atrium was normal in size. - Tricuspid valve: There was trivial regurgitation. - Pulmonic valve: There was no regurgitation. - Pulmonary arteries: Systolic pressure was within the normal range. - Inferior vena cava: The vessel was normal in size. - Pericardium, extracardiac: There was no pericardial effusion.  Assessment/Plan:  54 y/o female s/p recent Takotsubo event. Cath 12/30/2014 normal coronary arteries, moderately to severely reduced LV function with EF 30-35% with severe hypokinesis of mid distal anterior, apical, mid and distal inferior wall consistent with stress-induced cardiomyopathy. She was admitted 01/05/15 with pre syncope. Her B/P has been low on minimal medications. Fortunately her EF has improved. Will change Lisinopril 2.5 mg to Q HS, continue Coreg 3.125 mg BID.    Principal Problem:   Near syncope Active Problems:   Recent NSTEMI (Taktosubo event 12/30/14)   Orthostatic hypotension   ICM- EF 30-35% at cath 12/30/14-improved to 45% by echo 01/06/15   GERD (gastroesophageal reflux disease)   IBS (irritable bowel syndrome)   Biliary dyskinesia   PLAN: Change Lisinopril to Q HS. Ambulate this am, possibly home later if B/P stable.  I discussed echo results with the pt. She has a f/u scheduled with Dr Johnsie Cancel 8/25 (she doesn't need 7/25 apt with S.Weaver).   [employer] PA-C 01/07/2015, 8:42 AM [phone]

## 2015-01-07 NOTE — Discharge Summary (Signed)
Physician Discharge Summary  [name] YPP:[id number] DOB: 1960-06-24 DOA: 01/05/2015  PCP: Tammy Fraise, MD  Admit date: 01/05/2015 Discharge date: 01/07/2015  Recommendations for Outpatient Follow-up:  1. Pt will need to follow up with PCP in 2 weeks post discharge 2. Please follow up with Cardiology, [name], on 02/11/15@1100   Discharge Diagnoses:  Near syncope/Orthostatic hypotension -01/06/15--Orthostatic vitals positive with HR increasing by 27 beats/min and BP dropped from 118/70>>85/57 -continue tele--no concerning dysrhythmias -Appreciate Cardiology consult--agreed with continued carvedilol and lisinopril as blood pressure tolerates -On the day of discharge, repeat orthostatic vital signs did not show any drop in blood pressure -Had cardiac catheterization last admission--patent coronaries -Instructed the patient to count to 60 after getting up from a supine position before ambulation - Patient has an established follow-up appointment with [name] on 02/11/2015@1100  -The patient inquired about continued taking nitroglycerin-- I educated the patient that she had a heart catheterization approximately 2 weeks ago which showed patent coronary arteries.  As a result, her chest discomfort was not likely due to cardiac origin.  Therefore, I explained to the patient that she does not need any further nitroglycerin from a cardiac standpoint.  I advised that she follow-up with her gastroenterologist regarding her intermittent dysphagia. - Patient was instructed to change her lisinopril 2.5 mg to bedtime dosing (once daily at bedtime)   Recent Takotsubo syndrome -defer adjustment of cardiac meds to cardiology -Enzymes have normalized -repeat echo 01/06/2015 showed EF 45% -Heart catheterization on 12/29/2014 showed EF 45-80%   Chronic systolic congestive heart failure, NYHA class 2/EF 30-35% -Currently no signs or symptoms of decompensated heart failure -Continue  preadmission medications -Did not require diuretic during last admission or at discharge   HTN  -Current blood pressure well controlled   Biliary dyskinesia -Patient reports EF 35% and therefore did not meet criteria for surgical intervention -Now experiencing intermitten epigastric abdominal pain -Lipase normal -Patient was able to tolerate diet without any difficulties -Follow up with her outpatient gastroenterologist, Dr. Cristina Vazquez   GERD  -Reports having issues with swallowing and EGD had been planned for next week with Dr. Cristina Vazquez but placed on hold due to recent Takatsubo's CM   IBS  -Off Linzess for over 2 weeks  Discharge Condition: stable  Disposition: home Follow-up Information    Follow up with Tammy Rouge, MD On 02/11/2015.   Specialty:  Cardiology   Why:  11:00   Contact information:   [address] [place] [address] [phone]       Diet:heart heatlhy Wt Readings from Last 3 Encounters:  01/07/15 79.516 kg (175 lb 4.8 oz)  12/31/14 83.734 kg (184 lb 9.6 oz)  12/10/14 83.734 kg (184 lb 9.6 oz)    History of present illness:  54 year female patient with past medical history of childhood asthma, stress induced palpitations, kidney stones, arthritis, prior treatment of melanoma and parotid gland cancer who was recently discharged on 7/14 after presenting with chest pain and acute systolic heart failure secondary to Takotsubo syndrome. During that admission she underwent cardiac catheterization which revealed normal coronaries. She only had minimal peak in her troponins at 0.34. Ventriculogram during the catheterization revealed an EF of 30-35% with severe hypokinesis of the mid distal anterior, apical and mid to distal inferior walls consistent with stress-induced cardiomyopathy. During the hospitalization patient was started on statin, carvedilol, and lisinopril. She returns to the hospital  complaining of waking up in the night and  noticing that she was having  midsternal chest discomfort associated with diaphoresis and nausea. When she got up to walk she felt dizzy and faint. This prompted the patient to go lay back down in the bed. She eventually was able to sit up and symptoms improved .  She eventually called the cardiologist office who instructed her to report to the ER for further evaluation. In addition to the above symptoms she had a colicky type left upper quadrant pain that was very severe in nature. This was not associated with any emesis or diarrhea. After admission, troponins were cycled and were negative. Cardiology was consulted. Repeat echocardiogram was obtained. It showed improvement in her EF. The patient was continued on her carvedilol and lisinopril. Her lisinopril was switched to bedtime dosing. Although she was initially orthostatic, orthostatic blood pressures improved on the day of discharge.  Consultants: Cardiology  Discharge Exam: Filed Vitals:   01/07/15 0824  BP: 108/73  Pulse:   Temp:   Resp:    Filed Vitals:   01/07/15 0800 01/07/15 0818 01/07/15 0819 01/07/15 0824  BP: 95/59 93/59 95/59  108/73  Pulse: 92 68    Temp:      TempSrc:      Resp:      Height:      Weight:      SpO2:       General: A&O x 3, NAD, pleasant, cooperative Cardiovascular: RRR, no rub, no gallop, no S3 Respiratory: CTAB, no wheeze, no rhonchi Abdomen:soft, nontender, nondistended, positive bowel sounds Extremities: trace LE edema, No lymphangitis, no petechiae  Discharge Instructions      Discharge Instructions    Diet - low sodium heart healthy    Complete by:  As directed      Increase activity slowly    Complete by:  As directed             Medication List    STOP taking these medications        aspirin-acetaminophen-caffeine 250-250-65 MG per tablet  Commonly known as:  EXCEDRIN MIGRAINE     LINZESS 145 MCG Caps capsule  Generic drug:  Linaclotide      TAKE these medications         atorvastatin 20 MG tablet  Commonly known as:  LIPITOR  Take 1 tablet (20 mg total) by mouth daily.     CALCIUM 600 + D 600-200 MG-UNIT Tabs  Generic drug:  Calcium Carb-Cholecalciferol  Take 1 tablet by mouth daily.     carvedilol 3.125 MG tablet  Commonly known as:  COREG  Take 1 tablet (3.125 mg total) by mouth 2 (two) times daily with a meal.     clonazepam 0.125 MG disintegrating tablet  Commonly known as:  KLONOPIN  Take 0.125 mg by mouth daily as needed (for anxiety).     lisinopril 2.5 MG tablet  Commonly known as:  PRINIVIL,ZESTRIL  Take 1 tablet (2.5 mg total) by mouth daily.     multivitamin tablet  Take 1 tablet by mouth daily.     ondansetron 8 MG disintegrating tablet  Commonly known as:  ZOFRAN-ODT  Take 1 tablet (8 mg total) by mouth every 6 (six) hours as needed for nausea or vomiting.     THERATEARS ALLERGY 0.025 % ophthalmic solution  Generic drug:  ketotifen  Apply 1 drop to eye daily as needed (for dry eye relief).         The results of significant diagnostics from this hospitalization (including imaging, microbiology, ancillary and laboratory) are listed  below for reference.    Significant Diagnostic Studies: Dg Chest 2 View  01/05/2015   CLINICAL DATA:  Mid sternal chest pain, feels like her heart is going to jump out of her chest, palpitations, nausea, dizziness and diaphoresis today, history MI  EXAM: CHEST  2 VIEW  COMPARISON:  12/29/2014  FINDINGS: Normal heart size, mediastinal contours, and pulmonary vascularity.  Lungs clear.  No pleural effusion or pneumothorax.  Bones unremarkable.  IMPRESSION: No acute abnormalities.   Electronically Signed   By: Lavonia Dana M.D.   On: 01/05/2015 14:23   Dg Chest Port 1 View  12/29/2014   CLINICAL DATA:  54 year old female with chest pain. History of melanoma 2001 post excision. Subsequent encounter.  EXAM: PORTABLE CHEST - 1 VIEW  COMPARISON:  10/22/2014 chest x-ray.  FINDINGS: No infiltrate, congestive  heart failure or pneumothorax.  No plain film evidence pulmonary malignancy.  Remote right clavicle fracture.  Central pulmonary vascular prominence stable.  Heart size within normal limits.  IMPRESSION: No acute abnormality.  Please see above.   Electronically Signed   By: Genia Del M.D.   On: 12/29/2014 15:08     Microbiology: No results found for this or any previous visit (from the past 240 hour(s)).   Labs: Basic Metabolic Panel:  Recent Labs Lab 01/05/15 1400 01/06/15 0505 01/07/15 0337  NA 136 136 139  K 4.4 4.1 4.2  CL 99* 100* 105  CO2 26 28 28   GLUCOSE 92 91 100*  BUN 18 16 14   CREATININE 0.97 0.94 0.93  CALCIUM 9.8 9.0 9.4   Liver Function Tests:  Recent Labs Lab 01/05/15 1843  AST 25  ALT 19  ALKPHOS 52  BILITOT 0.7  PROT 7.2  ALBUMIN 4.3    Recent Labs Lab 01/05/15 1400  LIPASE 26   No results for input(s): AMMONIA in the last 168 hours. CBC:  Recent Labs Lab 01/05/15 1400 01/06/15 0505  WBC 4.8 3.4*  HGB 14.0 12.9  HCT 39.6 37.3  MCV 89.4 90.8  PLT 187 160   Cardiac Enzymes:  Recent Labs Lab 01/05/15 1843 01/05/15 2341 01/06/15 0505  TROPONINI <0.03 <0.03 <0.03   BNP: Invalid input(s): POCBNP CBG:  Recent Labs Lab 01/07/15 0727  GLUCAP 89    Time coordinating discharge:  Greater than 30 minutes  Signed:  Ashante Snelling, DO [facility] Pager: [phone] 01/07/2015, 10:49 AM

## 2015-01-11 ENCOUNTER — Encounter: Payer: BLUE CROSS/BLUE SHIELD | Admitting: Physician Assistant

## 2015-01-26 ENCOUNTER — Encounter (HOSPITAL_COMMUNITY)
Admission: RE | Admit: 2015-01-26 | Discharge: 2015-01-26 | Disposition: A | Discharge status: Home / Self Care | Payer: BLUE CROSS/BLUE SHIELD | Source: Ambulatory Visit | Attending: Cardiovascular Disease | Admitting: Cardiovascular Disease

## 2015-01-26 VITALS — BP 102/78 | HR 66 | Ht 64.0 in | Wt 175.0 lb

## 2015-01-26 DIAGNOSIS — I5181 Takotsubo syndrome: Secondary | ICD-10-CM | POA: Insufficient documentation

## 2015-01-26 DIAGNOSIS — I252 Old myocardial infarction: Secondary | ICD-10-CM | POA: Insufficient documentation

## 2015-01-26 DIAGNOSIS — I214 Non-ST elevation (NSTEMI) myocardial infarction: Secondary | ICD-10-CM

## 2015-01-26 NOTE — Progress Notes (Signed)
Patient arrived for 1st visit/orientation/education at 8:00am. Patient was referred to CR by Dr. Johnsie Cancel due to NSTEMI I21.4. During orientation advised patient on arrival and appointment times what to wear, what to do before, during and after exercise. Reviewed attendance and class policy. Talked about inclement weather and class consultation policy. Pt is scheduled to return Cardiac Rehab on 02/01/15 at 9:30am. Pt was advised to come to class 5 minutes before class starts. He was also given instructions on meeting with the dietician and attending the Family Structure classes. Pt is eager to get started. Patient was able to complete 6 minute walk test. Patient was measured for the equipment. Discussed equipment safety with patient. Took patient pre-anthropometric measurements. Patient finished visit at 12:50 pm.

## 2015-01-26 NOTE — Progress Notes (Signed)
Cardiac/Pulmonary Rehab Medication Review by a Pharmacist  Does the patient  feel that his/her medications are working for him/her?  yes  Has the patient been experiencing any side effects to the medications prescribed?  yes  Does the patient measure his/her own blood pressure or blood glucose at home?  yes   Does the patient have any problems obtaining medications due to transportation or finances?   no  Understanding of regimen: good Understanding of indications: good Potential of compliance: good  Questions asked to Determine Patient Understanding of Medication Regimen:  1. What is the name of the medication?  2. What is the medication used for?  3. When should it be taken?  4. How much should be taken?  5. How will you take it?  6. What side effects should you report?  Understanding Defined as: Excellent: All questions above are correct Good: Questions 1-4 are correct Fair: Questions 1-2 are correct  Poor: 1 or none of the above questions are correct   Pharmacist comments: [name] had to be hospitalized due to orthostatic hypotension.  She now takes her lisinopril at night and has had no further issues with dizziness.      Excell Seltzer Poteet 01/26/2015 10:30 AM

## 2015-02-01 ENCOUNTER — Encounter (HOSPITAL_COMMUNITY)
Admission: RE | Admit: 2015-02-01 | Discharge: 2015-02-01 | Disposition: A | Discharge status: Home / Self Care | Payer: BLUE CROSS/BLUE SHIELD | Source: Ambulatory Visit | Attending: Cardiovascular Disease | Admitting: Cardiovascular Disease

## 2015-02-01 DIAGNOSIS — I252 Old myocardial infarction: Secondary | ICD-10-CM | POA: Diagnosis not present

## 2015-02-03 ENCOUNTER — Encounter (HOSPITAL_COMMUNITY)
Admission: RE | Admit: 2015-02-03 | Discharge: 2015-02-03 | Disposition: A | Discharge status: Home / Self Care | Payer: BLUE CROSS/BLUE SHIELD | Source: Ambulatory Visit | Attending: Cardiovascular Disease | Admitting: Cardiovascular Disease

## 2015-02-03 DIAGNOSIS — I252 Old myocardial infarction: Secondary | ICD-10-CM | POA: Diagnosis not present

## 2015-02-03 NOTE — Progress Notes (Signed)
Cardiac Rehabilitation Program Outcomes Report   Orientation:  01/26/15 Graduate Date:  tbd Discharge Date:  tbd # of sessions completed: 3  Cardiologist: Nisdhan Family MD:  Stacks Class Time:  0930  A.  Exercise Program:  Tolerates exercise @ 2.53 METS for 15 minutes and Walk Test Results:  Pre: walk test 2.82 mets  B.  Mental Health:  Good mental attitude  C.  Education/Instruction/Skills  Accurately checks own pulse.  Rest:  66  Exercise:  107  Uses Perceived Exertion Scale and/or Dyspnea Scale  D.  Nutrition/Weight Control/Body Composition:  Adherence to prescribed nutrition program: fair    E.  Blood Lipids    Lab Results  Component Value Date   CHOL 239* 12/31/2014   HDL 46 12/31/2014   LDLCALC 172* 12/31/2014   TRIG 106 12/31/2014   CHOLHDL 5.2 12/31/2014    F.  Lifestyle Changes:  Making positive lifestyle changes  G.  Symptoms noted with exercise:  Asymptomatic  Report Completed By:   Stevphen Rochester RN    Comments:  This is the patients first week progress note for [facility].

## 2015-02-05 ENCOUNTER — Encounter (HOSPITAL_COMMUNITY)
Admission: RE | Admit: 2015-02-05 | Discharge: 2015-02-05 | Disposition: A | Discharge status: Home / Self Care | Payer: BLUE CROSS/BLUE SHIELD | Source: Ambulatory Visit | Attending: Cardiovascular Disease | Admitting: Cardiovascular Disease

## 2015-02-05 DIAGNOSIS — I252 Old myocardial infarction: Secondary | ICD-10-CM | POA: Diagnosis not present

## 2015-02-08 ENCOUNTER — Encounter (HOSPITAL_COMMUNITY)
Admission: RE | Admit: 2015-02-08 | Discharge: 2015-02-08 | Disposition: A | Discharge status: Home / Self Care | Payer: BLUE CROSS/BLUE SHIELD | Source: Ambulatory Visit | Attending: Cardiovascular Disease | Admitting: Cardiovascular Disease

## 2015-02-08 DIAGNOSIS — I252 Old myocardial infarction: Secondary | ICD-10-CM | POA: Diagnosis not present

## 2015-02-10 ENCOUNTER — Encounter (HOSPITAL_COMMUNITY)
Admission: RE | Admit: 2015-02-10 | Discharge: 2015-02-10 | Disposition: A | Discharge status: Home / Self Care | Payer: BLUE CROSS/BLUE SHIELD | Source: Ambulatory Visit | Attending: Cardiovascular Disease | Admitting: Cardiovascular Disease

## 2015-02-10 DIAGNOSIS — I252 Old myocardial infarction: Secondary | ICD-10-CM | POA: Diagnosis not present

## 2015-02-10 NOTE — Progress Notes (Signed)
Patient ID: DEMIAH Vazquez, female   DOB: 04/22/1961, 54 y.o.   MRN: [id number] 54 y.o.  Dr Tawanna Sat previous [facility] with negative w/u . .. Had normal cath in [facility] about 8 years ago Chronically abnormal ECG  No dyspnea palpitations or syncope. She is under some family stress and has a new puppy she is aggravated by  Myovue 03/21/12 normal EF 65% Short burst of PSVT BP still borderline. 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  Cath with no CAD 1. Normal coronary arteries. 2. Moderately to severely reduced LV systolic function with an ejection fraction of 30-35% with severe hypokinesis of the mid distal anterior, apical and mid to distal inferior walls consistent with stress-induced cardiomyopathy. 3. Mildly elevated left ventricular end-diastolic pressure.  Recommendations: Recommend a small dose beta blocker and ACE inhibitor. Avoid stress. Monitor for at least another day and possible discharge home tomorrow if she remains stable.  Echo reviewed and EF 45%   Started on ACE and beta blocker   Study Conclusions  - Left ventricle: The cavity size was normal. Systolic function was mildly reduced. The estimated ejection fraction was 45%. Diffuse hypokinesis. Doppler parameters are consistent with abnormal left ventricular relaxation (grade 1 diastolic dysfunction). There was no evidence of elevated ventricular filling pressure by Doppler parameters. - Aortic valve: Trileaflet; normal thickness leaflets. There was trivial regurgitation. - Aortic root: The aortic root was normal in size. - Mitral valve: Structurally normal valve. There was no regurgitation. - Left atrium: The atrium was normal in size. - Right ventricle: Systolic function was normal. - Right atrium: The atrium was normal in size. - Tricuspid valve:  There was trivial regurgitation. - Pulmonic valve: There was no regurgitation. - Pulmonary arteries: Systolic pressure was within the normal range. - Inferior vena cava: The vessel was normal in size. - Pericardium, extracardiac: There was no pericardial effusion.  ROS: Denies fever, malais, weight loss, blurry vision, decreased visual acuity, cough, sputum, SOB, hemoptysis, pleuritic pain, palpitaitons, heartburn, abdominal pain, melena, lower extremity edema, claudication, or rash.  All other systems reviewed and negative  General: Affect appropriate Healthy:  appears stated age 60: normal Neck supple with no adenopathy JVP normal no bruits no thyromegaly Lungs clear with no wheezing and good diaphragmatic motion Heart:  S1/S2 no murmur, no rub, gallop or click PMI normal Abdomen: benighn, BS positve, no tenderness, no AAA no bruit.  No HSM or HJR Distal pulses intact with no bruits No edema Neuro non-focal Skin warm and dry No muscular weakness   Current Outpatient Prescriptions  Medication Sig Dispense Refill  . atorvastatin (LIPITOR) 20 MG tablet Take 1 tablet (20 mg total) by mouth daily. 30 tablet 11  . Calcium Carb-Cholecalciferol (CALCIUM 600 + D) 600-200 MG-UNIT TABS Take 2 tablets by mouth daily.     . carvedilol (COREG) 3.125 MG tablet Take 1 tablet (3.125 mg total) by mouth 2 (two) times daily with a meal. 60 tablet 11  . clonazepam (KLONOPIN) 0.125 MG disintegrating tablet Take 0.125 mg by mouth daily as needed (for anxiety).     . gabapentin (NEURONTIN) 300 MG capsule Take 300 mg by mouth 2 (two) times daily.    Marland Kitchen ketotifen (THERA TEARS ALLERGY) 0.025 % ophthalmic solution Apply 1 drop to eye daily as needed (for dry eye relief).    Marland Kitchen  lisinopril (PRINIVIL,ZESTRIL) 2.5 MG tablet Take 1 tablet (2.5 mg total) by mouth daily. 30 tablet 11  . Multiple Vitamin (MULTIVITAMIN) tablet Take 1 tablet by mouth daily.    . ondansetron (ZOFRAN-ODT) 8 MG disintegrating  tablet Take 1 tablet (8 mg total) by mouth every 6 (six) hours as needed for nausea or vomiting. 20 tablet 1   No current facility-administered medications for this visit.    Allergies  Cephalexin  Electrocardiogram: 9/14  SR nonspecific ST changes  01/02/15  SR rate 72 LAD low voltage nonspecific ST changes persist  Assessment and Plan Takatsubo:  F/U cardiac MRI September EF should return to normal continue beta blocker and ACE Esoph:  Ok to have EGD October after MRI if EF returns to normal HTN:  Well controlled.  Continue current medications and low sodium Dash type diet.   Chol:  lipitor started f/u labs in 2 months  Lab Results  Component Value Date   LDLCALC 172* 12/31/2014

## 2015-02-11 ENCOUNTER — Encounter: Payer: Self-pay | Admitting: Cardiovascular Disease

## 2015-02-11 ENCOUNTER — Ambulatory Visit (INDEPENDENT_AMBULATORY_CARE_PROVIDER_SITE_OTHER): Payer: BLUE CROSS/BLUE SHIELD | Admitting: Cardiovascular Disease

## 2015-02-11 ENCOUNTER — Other Ambulatory Visit: Payer: Self-pay | Admitting: Neurology

## 2015-02-11 VITALS — BP 108/64 | HR 78 | Ht 64.0 in | Wt 171.1 lb

## 2015-02-11 DIAGNOSIS — E785 Hyperlipidemia, unspecified: Secondary | ICD-10-CM | POA: Diagnosis not present

## 2015-02-11 DIAGNOSIS — R079 Chest pain, unspecified: Secondary | ICD-10-CM | POA: Diagnosis not present

## 2015-02-11 DIAGNOSIS — Z79899 Other long term (current) drug therapy: Secondary | ICD-10-CM

## 2015-02-11 DIAGNOSIS — I214 Non-ST elevation (NSTEMI) myocardial infarction: Secondary | ICD-10-CM

## 2015-02-11 DIAGNOSIS — G930 Cerebral cysts: Secondary | ICD-10-CM

## 2015-02-11 MED ORDER — NITROGLYCERIN 0.4 MG SL SUBL: 0.4000 mg | SUBLINGUAL_TABLET | SUBLINGUAL | Status: DC | PRN

## 2015-02-11 NOTE — Patient Instructions (Addendum)
Medication Instructions:  NO CHANGES  Labwork: NONE  Testing/Procedures: Your physician has requested that you have a cardiac MRI. Cardiac MRI uses a computer to create images of your heart as its beating, producing both still and moving pictures of your heart and major blood vessels. For further information please visit [email or web address] Please follow the instruction sheet given to you today for more information. END OF  September  Thursday    Follow-Up: Your physician wants you to follow-up in: [place] will receive a reminder letter in the mail two months in advance. If you don't receive a letter, please call our office to schedule the follow-up appointment.  Any Other Special Instructions Will Be Listed Below (If Applicable).

## 2015-02-12 ENCOUNTER — Encounter (HOSPITAL_COMMUNITY): Payer: BLUE CROSS/BLUE SHIELD

## 2015-02-12 ENCOUNTER — Encounter: Payer: Self-pay | Admitting: Cardiovascular Disease

## 2015-02-15 ENCOUNTER — Encounter (HOSPITAL_COMMUNITY)
Admission: RE | Admit: 2015-02-15 | Discharge: 2015-02-15 | Disposition: A | Discharge status: Home / Self Care | Payer: BLUE CROSS/BLUE SHIELD | Source: Ambulatory Visit | Attending: Cardiovascular Disease | Admitting: Cardiovascular Disease

## 2015-02-15 DIAGNOSIS — I252 Old myocardial infarction: Secondary | ICD-10-CM | POA: Diagnosis not present

## 2015-02-17 ENCOUNTER — Encounter (HOSPITAL_COMMUNITY)
Admission: RE | Admit: 2015-02-17 | Discharge: 2015-02-17 | Disposition: A | Discharge status: Home / Self Care | Payer: BLUE CROSS/BLUE SHIELD | Source: Ambulatory Visit | Attending: Cardiovascular Disease | Admitting: Cardiovascular Disease

## 2015-02-17 DIAGNOSIS — I252 Old myocardial infarction: Secondary | ICD-10-CM | POA: Diagnosis not present

## 2015-02-19 ENCOUNTER — Encounter (HOSPITAL_COMMUNITY)
Admission: RE | Admit: 2015-02-19 | Discharge: 2015-02-19 | Disposition: A | Discharge status: Home / Self Care | Payer: BLUE CROSS/BLUE SHIELD | Source: Ambulatory Visit | Attending: Cardiovascular Disease | Admitting: Cardiovascular Disease

## 2015-02-19 DIAGNOSIS — I5181 Takotsubo syndrome: Secondary | ICD-10-CM | POA: Insufficient documentation

## 2015-02-19 DIAGNOSIS — I252 Old myocardial infarction: Secondary | ICD-10-CM | POA: Diagnosis not present

## 2015-02-22 ENCOUNTER — Encounter (HOSPITAL_COMMUNITY): Payer: BLUE CROSS/BLUE SHIELD

## 2015-02-24 ENCOUNTER — Encounter (HOSPITAL_COMMUNITY)
Admission: RE | Admit: 2015-02-24 | Discharge: 2015-02-24 | Disposition: A | Discharge status: Home / Self Care | Payer: BLUE CROSS/BLUE SHIELD | Source: Ambulatory Visit | Attending: Cardiovascular Disease | Admitting: Cardiovascular Disease

## 2015-02-24 DIAGNOSIS — I252 Old myocardial infarction: Secondary | ICD-10-CM | POA: Diagnosis not present

## 2015-02-24 NOTE — Progress Notes (Signed)
Patient was given individual home exercise plan. Handout was reviewed and discussed. Patient verbalized an understanding. 

## 2015-02-25 ENCOUNTER — Other Ambulatory Visit (HOSPITAL_COMMUNITY): Payer: BLUE CROSS/BLUE SHIELD

## 2015-02-25 ENCOUNTER — Ambulatory Visit
Admission: RE | Admit: 2015-02-25 | Discharge: 2015-02-25 | Disposition: A | Discharge status: Home / Self Care | Payer: BLUE CROSS/BLUE SHIELD | Source: Ambulatory Visit | Attending: Neurology | Admitting: Neurology

## 2015-02-25 DIAGNOSIS — G930 Cerebral cysts: Secondary | ICD-10-CM

## 2015-02-25 MED ORDER — GADOBENATE DIMEGLUMINE 529 MG/ML IV SOLN
15.0000 mL | Freq: Once | INTRAVENOUS | Status: AC | PRN
  Administered 2015-02-25: 15 mL via INTRAVENOUS

## 2015-02-26 ENCOUNTER — Encounter (HOSPITAL_COMMUNITY)
Admission: RE | Admit: 2015-02-26 | Discharge: 2015-02-26 | Disposition: A | Discharge status: Home / Self Care | Payer: BLUE CROSS/BLUE SHIELD | Source: Ambulatory Visit | Attending: Cardiovascular Disease | Admitting: Cardiovascular Disease

## 2015-02-26 DIAGNOSIS — I252 Old myocardial infarction: Secondary | ICD-10-CM | POA: Diagnosis not present

## 2015-03-01 ENCOUNTER — Encounter (HOSPITAL_COMMUNITY)
Admission: RE | Admit: 2015-03-01 | Discharge: 2015-03-01 | Disposition: A | Discharge status: Home / Self Care | Payer: BLUE CROSS/BLUE SHIELD | Source: Ambulatory Visit | Attending: Cardiovascular Disease | Admitting: Cardiovascular Disease

## 2015-03-01 DIAGNOSIS — I252 Old myocardial infarction: Secondary | ICD-10-CM | POA: Diagnosis not present

## 2015-03-03 ENCOUNTER — Encounter (HOSPITAL_COMMUNITY)
Admission: RE | Admit: 2015-03-03 | Discharge: 2015-03-03 | Disposition: A | Discharge status: Home / Self Care | Payer: BLUE CROSS/BLUE SHIELD | Source: Ambulatory Visit | Attending: Cardiovascular Disease | Admitting: Cardiovascular Disease

## 2015-03-03 DIAGNOSIS — I252 Old myocardial infarction: Secondary | ICD-10-CM | POA: Diagnosis not present

## 2015-03-05 ENCOUNTER — Encounter (HOSPITAL_COMMUNITY)
Admission: RE | Admit: 2015-03-05 | Discharge: 2015-03-05 | Disposition: A | Discharge status: Home / Self Care | Payer: BLUE CROSS/BLUE SHIELD | Source: Ambulatory Visit | Attending: Cardiovascular Disease | Admitting: Cardiovascular Disease

## 2015-03-05 ENCOUNTER — Telehealth: Payer: Self-pay | Admitting: Cardiovascular Disease

## 2015-03-05 DIAGNOSIS — I252 Old myocardial infarction: Secondary | ICD-10-CM | POA: Diagnosis not present

## 2015-03-05 NOTE — Telephone Encounter (Signed)
New message     Pt having problem with dizziness, pt not sure if medication could be effecting b/p and causing the dizziness Pt states she has not fallen,just dizzy Pt states no chest pain or SOB

## 2015-03-05 NOTE — Telephone Encounter (Signed)
SPOKE WITH PT  HAS NOTED DIZZINESS AT  LEAST ON A DAILY BASIS  FOR   2 WEEKS PER PT  B/P  AT REHAB HAS  BEEN NOTED   AROUND  90 /40  INSTRUCTED PT   TO  CHECK  B/P   WHEN HAS  DIZZY  EPISODE  AND  TO  KEEP LOG  ALSO  ASKED PT  TO  TAKE  LISINOPRIL  IN  AFTERNOON INSTEAD OF  AT  NIGHT  WILL FORWARD TO  DR Johnsie Cancel  FOR  REVIEW .Adonis Housekeeper

## 2015-03-07 NOTE — Telephone Encounter (Signed)
Doubt it is from bp or heart

## 2015-03-08 ENCOUNTER — Encounter (HOSPITAL_COMMUNITY)
Admission: RE | Admit: 2015-03-08 | Discharge: 2015-03-08 | Disposition: A | Discharge status: Home / Self Care | Payer: BLUE CROSS/BLUE SHIELD | Source: Ambulatory Visit | Attending: Cardiovascular Disease | Admitting: Cardiovascular Disease

## 2015-03-08 DIAGNOSIS — I252 Old myocardial infarction: Secondary | ICD-10-CM | POA: Diagnosis not present

## 2015-03-08 NOTE — Telephone Encounter (Signed)
LM TO CALL BACK ./CY 

## 2015-03-09 NOTE — Telephone Encounter (Signed)
PT  NOTIFIED ./CY 

## 2015-03-09 NOTE — Telephone Encounter (Signed)
Follow up     Pt returning your call Pt will be at home until 1 today

## 2015-03-10 ENCOUNTER — Encounter (HOSPITAL_COMMUNITY)
Admission: RE | Admit: 2015-03-10 | Discharge: 2015-03-10 | Disposition: A | Discharge status: Home / Self Care | Payer: BLUE CROSS/BLUE SHIELD | Source: Ambulatory Visit | Attending: Cardiovascular Disease | Admitting: Cardiovascular Disease

## 2015-03-10 DIAGNOSIS — I252 Old myocardial infarction: Secondary | ICD-10-CM | POA: Diagnosis not present

## 2015-03-12 ENCOUNTER — Encounter (HOSPITAL_COMMUNITY)
Admission: RE | Admit: 2015-03-12 | Discharge: 2015-03-12 | Disposition: A | Discharge status: Home / Self Care | Payer: BLUE CROSS/BLUE SHIELD | Source: Ambulatory Visit | Attending: Cardiovascular Disease | Admitting: Cardiovascular Disease

## 2015-03-12 DIAGNOSIS — I252 Old myocardial infarction: Secondary | ICD-10-CM | POA: Diagnosis not present

## 2015-03-15 ENCOUNTER — Encounter (HOSPITAL_COMMUNITY)
Admission: RE | Admit: 2015-03-15 | Discharge: 2015-03-15 | Disposition: A | Discharge status: Home / Self Care | Payer: BLUE CROSS/BLUE SHIELD | Source: Ambulatory Visit | Attending: Cardiovascular Disease | Admitting: Cardiovascular Disease

## 2015-03-15 DIAGNOSIS — I252 Old myocardial infarction: Secondary | ICD-10-CM | POA: Diagnosis not present

## 2015-03-15 NOTE — Progress Notes (Signed)
Cardiac Rehabilitation Program Outcomes Report   Orientation:  01/26/15 Graduate Date:  tbd Discharge Date:  tbd # of sessions completed: 18  Cardiologist: Johnsie Cancel Family MD:  Stacks Class Time:  0930  A.  Exercise Program:  Tolerates exercise @ 3.55 METS for 15 minutes  B.  Mental Health:  Good mental attitude  C.  Education/Instruction/Skills  Accurately checks own pulse.  Rest:  81  Exercise:  103  Uses Perceived Exertion Scale and/or Dyspnea Scale  D.  Nutrition/Weight Control/Body Composition:  Adherence to prescribed nutrition program: fair    E.  Blood Lipids    Lab Results  Component Value Date   CHOL 239* 12/31/2014   HDL 46 12/31/2014   LDLCALC 172* 12/31/2014   TRIG 106 12/31/2014   CHOLHDL 5.2 12/31/2014    F.  Lifestyle Changes:  Making positive lifestyle changes  G.  Symptoms noted with exercise:  Asymptomatic  Report Completed By:  Stevphen Rochester RN   Comments:  This is the patients half way progress note for [facility].

## 2015-03-17 ENCOUNTER — Encounter (HOSPITAL_COMMUNITY)
Admission: RE | Admit: 2015-03-17 | Discharge: 2015-03-17 | Disposition: A | Discharge status: Home / Self Care | Payer: BLUE CROSS/BLUE SHIELD | Source: Ambulatory Visit | Attending: Cardiovascular Disease | Admitting: Cardiovascular Disease

## 2015-03-17 DIAGNOSIS — I252 Old myocardial infarction: Secondary | ICD-10-CM | POA: Diagnosis not present

## 2015-03-18 ENCOUNTER — Ambulatory Visit (HOSPITAL_COMMUNITY)
Admission: RE | Admit: 2015-03-18 | Discharge: 2015-03-18 | Disposition: A | Discharge status: Home / Self Care | Payer: BLUE CROSS/BLUE SHIELD | Source: Ambulatory Visit | Attending: Cardiovascular Disease | Admitting: Cardiovascular Disease

## 2015-03-18 DIAGNOSIS — I214 Non-ST elevation (NSTEMI) myocardial infarction: Secondary | ICD-10-CM | POA: Diagnosis not present

## 2015-03-18 DIAGNOSIS — I429 Cardiomyopathy, unspecified: Secondary | ICD-10-CM | POA: Diagnosis not present

## 2015-03-18 LAB — CREATININE, SERUM: CREATININE: 0.7 mg/dL (ref 0.44–1.00)

## 2015-03-18 MED ORDER — GADOBENATE DIMEGLUMINE 529 MG/ML IV SOLN
24.0000 mL | Freq: Once | INTRAVENOUS | Status: AC | PRN
  Administered 2015-03-18: 24 mL via INTRAVENOUS

## 2015-03-19 ENCOUNTER — Encounter (HOSPITAL_COMMUNITY)
Admission: RE | Admit: 2015-03-19 | Discharge: 2015-03-19 | Disposition: A | Discharge status: Home / Self Care | Payer: BLUE CROSS/BLUE SHIELD | Source: Ambulatory Visit | Attending: Cardiovascular Disease | Admitting: Cardiovascular Disease

## 2015-03-19 DIAGNOSIS — I252 Old myocardial infarction: Secondary | ICD-10-CM | POA: Diagnosis not present

## 2015-03-22 ENCOUNTER — Encounter (HOSPITAL_COMMUNITY)
Admission: RE | Admit: 2015-03-22 | Discharge: 2015-03-22 | Disposition: A | Discharge status: Home / Self Care | Payer: BLUE CROSS/BLUE SHIELD | Source: Ambulatory Visit | Attending: Cardiovascular Disease | Admitting: Cardiovascular Disease

## 2015-03-22 DIAGNOSIS — I252 Old myocardial infarction: Secondary | ICD-10-CM | POA: Diagnosis present

## 2015-03-22 DIAGNOSIS — I5181 Takotsubo syndrome: Secondary | ICD-10-CM | POA: Diagnosis not present

## 2015-03-24 ENCOUNTER — Encounter (HOSPITAL_COMMUNITY)
Admission: RE | Admit: 2015-03-24 | Discharge: 2015-03-24 | Disposition: A | Discharge status: Home / Self Care | Payer: BLUE CROSS/BLUE SHIELD | Source: Ambulatory Visit | Attending: Cardiovascular Disease | Admitting: Cardiovascular Disease

## 2015-03-24 DIAGNOSIS — I252 Old myocardial infarction: Secondary | ICD-10-CM | POA: Diagnosis not present

## 2015-03-26 ENCOUNTER — Telehealth: Payer: Self-pay | Admitting: Cardiovascular Disease

## 2015-03-26 ENCOUNTER — Encounter (HOSPITAL_COMMUNITY)
Admission: RE | Admit: 2015-03-26 | Discharge: 2015-03-26 | Disposition: A | Discharge status: Home / Self Care | Payer: BLUE CROSS/BLUE SHIELD | Source: Ambulatory Visit | Attending: Cardiovascular Disease | Admitting: Cardiovascular Disease

## 2015-03-26 DIAGNOSIS — I252 Old myocardial infarction: Secondary | ICD-10-CM | POA: Diagnosis not present

## 2015-03-26 NOTE — Telephone Encounter (Signed)
LAB ORDERS  FAXED  TO LISTED  NUMBER  AT  PT'S REQUEST .[name]

## 2015-03-26 NOTE — Telephone Encounter (Signed)
New Message   Pt wants to have lab work done at her pcp lipid and liver test  [phone] please fax the order

## 2015-03-27 ENCOUNTER — Encounter: Payer: Self-pay | Admitting: Family Medicine

## 2015-03-27 ENCOUNTER — Ambulatory Visit (INDEPENDENT_AMBULATORY_CARE_PROVIDER_SITE_OTHER): Payer: BLUE CROSS/BLUE SHIELD | Admitting: Family Medicine

## 2015-03-27 ENCOUNTER — Other Ambulatory Visit: Payer: Self-pay | Admitting: Cardiovascular Disease

## 2015-03-27 VITALS — BP 116/73 | HR 71 | Temp 97.3°F | Ht 64.0 in | Wt 175.2 lb

## 2015-03-27 DIAGNOSIS — N390 Urinary tract infection, site not specified: Secondary | ICD-10-CM | POA: Diagnosis not present

## 2015-03-27 DIAGNOSIS — R350 Frequency of micturition: Secondary | ICD-10-CM

## 2015-03-27 DIAGNOSIS — Z23 Encounter for immunization: Secondary | ICD-10-CM

## 2015-03-27 DIAGNOSIS — Z79899 Other long term (current) drug therapy: Secondary | ICD-10-CM

## 2015-03-27 DIAGNOSIS — E785 Hyperlipidemia, unspecified: Secondary | ICD-10-CM

## 2015-03-27 LAB — POCT UA - MICROSCOPIC ONLY
CASTS, UR, LPF, POC: NEGATIVE
CRYSTALS, UR, HPF, POC: NEGATIVE
YEAST UA: NEGATIVE

## 2015-03-27 LAB — POCT URINALYSIS DIPSTICK
Bilirubin, UA: NEGATIVE
Glucose, UA: NEGATIVE
KETONES UA: NEGATIVE
Nitrite, UA: NEGATIVE
PH UA: 5
PROTEIN UA: NEGATIVE
SPEC GRAV UA: 1.015
Urobilinogen, UA: NEGATIVE

## 2015-03-27 MED ORDER — CIPROFLOXACIN HCL 250 MG PO TABS: 250.0000 mg | ORAL_TABLET | Freq: Two times a day (BID) | ORAL | Status: DC

## 2015-03-27 NOTE — Progress Notes (Signed)
   HPI  Patient presents today with concerns for UTI.  She She's had 2 days of urinary frequency and low back pain described as a dull achy side to side low back pain. It's very distinctly different than previous renal stone pain.  She denies fever, chills, sweats, dysuria, hematuria, or foul-smelling urine.  He has a normal appetite and by mouth tolerance.  PMH: Smoking status noted ROS: Per HPI  Objective: BP 116/73 mmHg  Pulse 71  Temp(Src) 97.3 F (36.3 C) (Oral)  Ht 5\' 4"  (1.626 m)  Wt 175 lb 3.2 oz (79.47 kg)  BMI 30.06 kg/m2  LMP 05/18/2011 Gen: NAD, alert, cooperative with exam HEENT: NCAT, EOMI, PERRL CV: RRR, good S1/S2, no murmur Resp: CTABL, no wheezes, non-labored Abd: SNTND, BS present, no guarding or organomegaly Ext: No edema, warm Neuro: Alert and oriented, No gross deficits  Assessment and plan:  # UTI Treatment Cipro, allergic to Keflex Sent for culture No signs of pyelo   Orders Placed This Encounter  Procedures  . Urine culture  . POCT UA - Microscopic Only  . POCT urinalysis dipstick    Meds ordered this encounter  Medications  . ciprofloxacin (CIPRO) 250 MG tablet    Sig: Take 1 tablet (250 mg total) by mouth 2 (two) times daily.    Dispense:  6 tablet    Refill:  0    Laroy Apple, MD [facility] 03/27/2015, 9:13 AM

## 2015-03-27 NOTE — Addendum Note (Signed)
Addended by: Lyn Henri C on: 03/27/2015 09:20 AM   Modules accepted: Orders

## 2015-03-27 NOTE — Patient Instructions (Signed)
Great to meet you!  I have sent ciprofloxacin to your pharmacy.   Urinary Tract Infection Urinary tract infections (UTIs) can develop anywhere along your urinary tract. Your urinary tract is your body's drainage system for removing wastes and extra water. Your urinary tract includes two kidneys, two ureters, a bladder, and a urethra. Your kidneys are a pair of bean-shaped organs. Each kidney is about the size of your fist. They are located below your ribs, one on each side of your spine. CAUSES Infections are caused by microbes, which are microscopic organisms, including fungi, viruses, and bacteria. These organisms are so small that they can only be seen through a microscope. Bacteria are the microbes that most commonly cause UTIs. SYMPTOMS  Symptoms of UTIs may vary by age and gender of the patient and by the location of the infection. Symptoms in young women typically include a frequent and intense urge to urinate and a painful, burning feeling in the bladder or urethra during urination. Older women and men are more likely to be tired, shaky, and weak and have muscle aches and abdominal pain. A fever may mean the infection is in your kidneys. Other symptoms of a kidney infection include pain in your back or sides below the ribs, nausea, and vomiting. DIAGNOSIS To diagnose a UTI, your caregiver will ask you about your symptoms. Your caregiver will also ask you to provide a urine sample. The urine sample will be tested for bacteria and white blood cells. White blood cells are made by your body to help fight infection. TREATMENT  Typically, UTIs can be treated with medication. Because most UTIs are caused by a bacterial infection, they usually can be treated with the use of antibiotics. The choice of antibiotic and length of treatment depend on your symptoms and the type of bacteria causing your infection. HOME CARE INSTRUCTIONS  If you were prescribed antibiotics, take them exactly as your caregiver  instructs you. Finish the medication even if you feel better after you have only taken some of the medication.  Drink enough water and fluids to keep your urine clear or pale yellow.  Avoid caffeine, tea, and carbonated beverages. They tend to irritate your bladder.  Empty your bladder often. Avoid holding urine for long periods of time.  Empty your bladder before and after sexual intercourse.  After a bowel movement, women should cleanse from front to back. Use each tissue only once. SEEK MEDICAL CARE IF:   You have back pain.  You develop a fever.  Your symptoms do not begin to resolve within 3 days. SEEK IMMEDIATE MEDICAL CARE IF:   You have severe back pain or lower abdominal pain.  You develop chills.  You have nausea or vomiting.  You have continued burning or discomfort with urination. MAKE SURE YOU:   Understand these instructions.  Will watch your condition.  Will get help right away if you are not doing well or get worse.   This information is not intended to replace advice given to you by your health care provider. Make sure you discuss any questions you have with your health care provider.   Document Released: 03/15/2005 Document Revised: 02/24/2015 Document Reviewed: 07/14/2011 Elsevier Interactive Patient Education [employer].

## 2015-03-29 ENCOUNTER — Encounter (HOSPITAL_COMMUNITY)
Admission: RE | Admit: 2015-03-29 | Discharge: 2015-03-29 | Disposition: A | Discharge status: Home / Self Care | Payer: BLUE CROSS/BLUE SHIELD | Source: Ambulatory Visit | Attending: Cardiovascular Disease | Admitting: Cardiovascular Disease

## 2015-03-29 DIAGNOSIS — I252 Old myocardial infarction: Secondary | ICD-10-CM | POA: Diagnosis not present

## 2015-03-30 LAB — HEPATIC FUNCTION PANEL
ALK PHOS: 58 IU/L (ref 39–117)
ALT: 15 IU/L (ref 0–32)
AST: 18 IU/L (ref 0–40)
Albumin: 4.2 g/dL (ref 3.5–5.5)
Bilirubin Total: 0.3 mg/dL (ref 0.0–1.2)
Bilirubin, Direct: 0.08 mg/dL (ref 0.00–0.40)
Total Protein: 6.3 g/dL (ref 6.0–8.5)

## 2015-03-30 LAB — LIPID PANEL W/O CHOL/HDL RATIO
Cholesterol, Total: 149 mg/dL (ref 100–199)
HDL: 49 mg/dL (ref >39)
LDL Calculated: 76 mg/dL (ref 0–99)
TRIGLYCERIDES: 120 mg/dL (ref 0–149)
VLDL Cholesterol Cal: 24 mg/dL (ref 5–40)

## 2015-03-30 LAB — URINE CULTURE

## 2015-03-30 LAB — SPECIMEN STATUS REPORT

## 2015-03-31 ENCOUNTER — Encounter (HOSPITAL_COMMUNITY)
Admission: RE | Admit: 2015-03-31 | Discharge: 2015-03-31 | Disposition: A | Discharge status: Home / Self Care | Payer: BLUE CROSS/BLUE SHIELD | Source: Ambulatory Visit | Attending: Cardiovascular Disease | Admitting: Cardiovascular Disease

## 2015-03-31 DIAGNOSIS — I252 Old myocardial infarction: Secondary | ICD-10-CM | POA: Diagnosis not present

## 2015-04-02 ENCOUNTER — Other Ambulatory Visit (INDEPENDENT_AMBULATORY_CARE_PROVIDER_SITE_OTHER): Payer: BLUE CROSS/BLUE SHIELD

## 2015-04-02 ENCOUNTER — Other Ambulatory Visit: Payer: Self-pay | Admitting: Family Medicine

## 2015-04-02 ENCOUNTER — Telehealth: Payer: Self-pay | Admitting: Family Medicine

## 2015-04-02 ENCOUNTER — Encounter (HOSPITAL_COMMUNITY)
Admission: RE | Admit: 2015-04-02 | Discharge: 2015-04-02 | Disposition: A | Discharge status: Home / Self Care | Payer: BLUE CROSS/BLUE SHIELD | Source: Ambulatory Visit | Attending: Cardiovascular Disease | Admitting: Cardiovascular Disease

## 2015-04-02 DIAGNOSIS — R300 Dysuria: Secondary | ICD-10-CM

## 2015-04-02 DIAGNOSIS — I252 Old myocardial infarction: Secondary | ICD-10-CM | POA: Diagnosis not present

## 2015-04-02 LAB — POCT URINALYSIS DIPSTICK
BILIRUBIN UA: NEGATIVE
Glucose, UA: NEGATIVE
KETONES UA: NEGATIVE
LEUKOCYTES UA: NEGATIVE
Nitrite, UA: NEGATIVE
PH UA: 8
Protein, UA: NEGATIVE
Spec Grav, UA: 1.01
Urobilinogen, UA: NEGATIVE

## 2015-04-02 LAB — POCT UA - MICROSCOPIC ONLY
CASTS, UR, LPF, POC: NEGATIVE
Crystals, Ur, HPF, POC: NEGATIVE
MUCUS UA: NEGATIVE
RBC, urine, microscopic: NEGATIVE
WBC, UR, HPF, POC: NEGATIVE
YEAST UA: NEGATIVE

## 2015-04-02 MED ORDER — NITROFURANTOIN MACROCRYSTAL 100 MG PO CAPS: 100.0000 mg | ORAL_CAPSULE | Freq: Two times a day (BID) | ORAL | Status: DC

## 2015-04-02 NOTE — Telephone Encounter (Signed)
Sending a prescription for Macrodantin 100 mg by mouth twice a day for 7 days. Please asked the patient to drop by for repeat urine culture as the original specimen came back inconclusive. She really should do that before she starts on the replacement antibiotic. Thanks, WS.

## 2015-04-02 NOTE — Telephone Encounter (Signed)
Stp and advised we need another sample and then we will send in the rx to the pharmacy. Pt voiced understanding and will come into the office to leave [place] a urine sample. Order put in for urine culture and advised pt she has to leave sample prior to taking the antibiotic. Pt voiced understanding, new rx sent to the pharmacy.

## 2015-04-02 NOTE — Telephone Encounter (Signed)
Stp's husband advised rx was sent to pharmacy already.

## 2015-04-02 NOTE — Telephone Encounter (Signed)
Pt was given cipro for 3 days and she is still symptomatic and would like more antibiotics. Please advise, she saw Dr.Bradshaw this past Saturday.

## 2015-04-04 LAB — URINE CULTURE: Organism ID, Bacteria: NO GROWTH

## 2015-04-05 ENCOUNTER — Encounter (HOSPITAL_COMMUNITY)
Admission: RE | Admit: 2015-04-05 | Discharge: 2015-04-05 | Disposition: A | Discharge status: Home / Self Care | Payer: BLUE CROSS/BLUE SHIELD | Source: Ambulatory Visit | Attending: Cardiovascular Disease | Admitting: Cardiovascular Disease

## 2015-04-05 ENCOUNTER — Telehealth: Payer: Self-pay | Admitting: *Deleted

## 2015-04-05 DIAGNOSIS — I252 Old myocardial infarction: Secondary | ICD-10-CM | POA: Diagnosis not present

## 2015-04-05 NOTE — Telephone Encounter (Signed)
Pt is having continued back oain with frequent urination Informed pt that culture was negative She was previously seeing urology for this She will try to get appt with urology She will call back if needs referral

## 2015-04-07 ENCOUNTER — Emergency Department (HOSPITAL_COMMUNITY): Payer: BLUE CROSS/BLUE SHIELD

## 2015-04-07 ENCOUNTER — Emergency Department (HOSPITAL_COMMUNITY)
Admission: EM | Admit: 2015-04-07 | Discharge: 2015-04-07 | Disposition: A | Discharge status: Home / Self Care | Payer: BLUE CROSS/BLUE SHIELD | Attending: Emergency Medicine | Admitting: Emergency Medicine

## 2015-04-07 ENCOUNTER — Encounter (HOSPITAL_COMMUNITY)
Admission: RE | Admit: 2015-04-07 | Discharge: 2015-04-07 | Disposition: A | Discharge status: Home / Self Care | Payer: BLUE CROSS/BLUE SHIELD | Source: Ambulatory Visit | Attending: Cardiovascular Disease | Admitting: Cardiovascular Disease

## 2015-04-07 ENCOUNTER — Encounter (HOSPITAL_COMMUNITY): Payer: Self-pay | Admitting: Emergency Medicine

## 2015-04-07 DIAGNOSIS — I252 Old myocardial infarction: Secondary | ICD-10-CM | POA: Diagnosis not present

## 2015-04-07 DIAGNOSIS — Z79899 Other long term (current) drug therapy: Secondary | ICD-10-CM | POA: Insufficient documentation

## 2015-04-07 DIAGNOSIS — M545 Low back pain, unspecified: Secondary | ICD-10-CM

## 2015-04-07 DIAGNOSIS — G8929 Other chronic pain: Secondary | ICD-10-CM | POA: Diagnosis not present

## 2015-04-07 DIAGNOSIS — Z85828 Personal history of other malignant neoplasm of skin: Secondary | ICD-10-CM | POA: Diagnosis not present

## 2015-04-07 DIAGNOSIS — F329 Major depressive disorder, single episode, unspecified: Secondary | ICD-10-CM | POA: Diagnosis not present

## 2015-04-07 DIAGNOSIS — Z9889 Other specified postprocedural states: Secondary | ICD-10-CM | POA: Diagnosis not present

## 2015-04-07 DIAGNOSIS — Z792 Long term (current) use of antibiotics: Secondary | ICD-10-CM | POA: Insufficient documentation

## 2015-04-07 DIAGNOSIS — E7800 Pure hypercholesterolemia, unspecified: Secondary | ICD-10-CM | POA: Insufficient documentation

## 2015-04-07 DIAGNOSIS — R21 Rash and other nonspecific skin eruption: Secondary | ICD-10-CM | POA: Insufficient documentation

## 2015-04-07 DIAGNOSIS — J45909 Unspecified asthma, uncomplicated: Secondary | ICD-10-CM | POA: Insufficient documentation

## 2015-04-07 DIAGNOSIS — F419 Anxiety disorder, unspecified: Secondary | ICD-10-CM | POA: Diagnosis not present

## 2015-04-07 DIAGNOSIS — R300 Dysuria: Secondary | ICD-10-CM | POA: Diagnosis not present

## 2015-04-07 DIAGNOSIS — K219 Gastro-esophageal reflux disease without esophagitis: Secondary | ICD-10-CM | POA: Insufficient documentation

## 2015-04-07 DIAGNOSIS — M1990 Unspecified osteoarthritis, unspecified site: Secondary | ICD-10-CM | POA: Insufficient documentation

## 2015-04-07 DIAGNOSIS — Z87440 Personal history of urinary (tract) infections: Secondary | ICD-10-CM | POA: Diagnosis not present

## 2015-04-07 DIAGNOSIS — Z87442 Personal history of urinary calculi: Secondary | ICD-10-CM | POA: Insufficient documentation

## 2015-04-07 DIAGNOSIS — R51 Headache: Secondary | ICD-10-CM | POA: Insufficient documentation

## 2015-04-07 DIAGNOSIS — Z8509 Personal history of malignant neoplasm of other digestive organs: Secondary | ICD-10-CM | POA: Diagnosis not present

## 2015-04-07 DIAGNOSIS — Z7982 Long term (current) use of aspirin: Secondary | ICD-10-CM | POA: Insufficient documentation

## 2015-04-07 LAB — CBC WITH DIFFERENTIAL/PLATELET
BASOS PCT: 0 %
Basophils Absolute: 0 10*3/uL (ref 0.0–0.1)
EOS ABS: 0.1 10*3/uL (ref 0.0–0.7)
Eosinophils Relative: 2 %
HEMATOCRIT: 35.9 % — AB (ref 36.0–46.0)
Hemoglobin: 12.4 g/dL (ref 12.0–15.0)
Lymphocytes Relative: 31 %
Lymphs Abs: 1 10*3/uL (ref 0.7–4.0)
MCH: 31.6 pg (ref 26.0–34.0)
MCHC: 34.5 g/dL (ref 30.0–36.0)
MCV: 91.6 fL (ref 78.0–100.0)
MONO ABS: 0.3 10*3/uL (ref 0.1–1.0)
MONOS PCT: 8 %
Neutro Abs: 2 10*3/uL (ref 1.7–7.7)
Neutrophils Relative %: 59 %
Platelets: 121 10*3/uL — ABNORMAL LOW (ref 150–400)
RBC: 3.92 MIL/uL (ref 3.87–5.11)
RDW: 12.3 % (ref 11.5–15.5)
WBC: 3.3 10*3/uL — ABNORMAL LOW (ref 4.0–10.5)

## 2015-04-07 LAB — URINALYSIS, ROUTINE W REFLEX MICROSCOPIC
BILIRUBIN URINE: NEGATIVE
Glucose, UA: NEGATIVE mg/dL
KETONES UR: NEGATIVE mg/dL
Leukocytes, UA: NEGATIVE
NITRITE: NEGATIVE
Protein, ur: NEGATIVE mg/dL
Specific Gravity, Urine: 1.01 (ref 1.005–1.030)
UROBILINOGEN UA: 0.2 mg/dL (ref 0.0–1.0)
pH: 6 (ref 5.0–8.0)

## 2015-04-07 LAB — BASIC METABOLIC PANEL
Anion gap: 6 (ref 5–15)
BUN: 10 mg/dL (ref 6–20)
CALCIUM: 9.1 mg/dL (ref 8.9–10.3)
CO2: 29 mmol/L (ref 22–32)
CREATININE: 0.74 mg/dL (ref 0.44–1.00)
Chloride: 106 mmol/L (ref 101–111)
GFR calc non Af Amer: >60 mL/min (ref >60)
Glucose, Bld: 95 mg/dL (ref 65–99)
Potassium: 3.8 mmol/L (ref 3.5–5.1)
SODIUM: 141 mmol/L (ref 135–145)

## 2015-04-07 LAB — URINE MICROSCOPIC-ADD ON

## 2015-04-07 MED ORDER — ACETAMINOPHEN 325 MG PO TABS
650.0000 mg | ORAL_TABLET | Freq: Once | ORAL | Status: AC
  Administered 2015-04-07: 650 mg via ORAL
  Filled 2015-04-07: qty 2

## 2015-04-07 MED ORDER — HYDROCODONE-ACETAMINOPHEN 5-325 MG PO TABS: 1.0000 | ORAL_TABLET | Freq: Four times a day (QID) | ORAL | Status: DC | PRN

## 2015-04-07 MED ORDER — PHENAZOPYRIDINE HCL 200 MG PO TABS: 200.0000 mg | ORAL_TABLET | Freq: Three times a day (TID) | ORAL | Status: DC

## 2015-04-07 NOTE — ED Provider Notes (Signed)
CSN: [id number]     Arrival date & time 04/07/15  1038 History  By signing my name below, I, Stephania Fragmin, attest that this documentation has been prepared under the direction and in the presence of Fredia Sorrow, MD. Electronically Signed: Stephania Fragmin, ED Scribe. 04/07/2015. 12:12 PM.   Chief Complaint  Patient presents with  . Flank Pain   Patient is a 54 y.o. female presenting with back pain. The history is provided by the patient. No language interpreter was used.  Back Pain Location:  Lumbar spine Quality: dull, throbbing. Radiates to:  Does not radiate Pain severity:  Severe Onset quality:  Gradual Duration:  13 days Timing:  Constant Relieved by:  Nothing Exacerbated by: getting up to walk. Ineffective treatments: aspirin 81 mg. Associated symptoms: dysuria and headaches (chronic)   Associated symptoms: no abdominal pain, no chest pain and no fever   Risk factors: hx of cancer    HPI Comments: [name] is a 54 y.o. 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 She also complains of associated burning dysuria that has been ongoing for the same amount of time. Patient states she had seen her PCP twice since 03/27/15 for a UTI and had urine cultures done; the first was "inconclusive," and the second one was normal. Patient has been told she has interstitial cystitis in the past. Getting up to walk makes her back pain worse, although the pain subsides once she has stood.  She has not taken anything for her pain today, although she notes she takes a daily baby aspirin.She notes has seen a urologist, Dr. Rosana Hoes at [facility], for kidney stones before, but she reports this pain feels different from kidney  stones. Patient notes a chronic rash due to radiation therapy on her parotid glands, a chronic headache treated with daily Neurontin. She denies fevers, chills, visual changes, cough, rhinorrhea, sore throat, chest pain, SOB, abdominal pain, nausea, vomiting, diarrhea, leg swelling, or bleeding easily. PCP: Claretta Fraise, MD   Past Medical History  Diagnosis Date  . PONV (postoperative nausea and vomiting)   . Left ureteral calculus   . History of palpitations     STRESS INDUCED  . Kidney stones   . Kidney cysts     "I think it was right"  . Hypercholesterolemia dx'd 12/2014  . Childhood asthma   . NSTEMI (non-ST elevated myocardial infarction) ([place]) 12/28/2014  . GERD (gastroesophageal reflux disease)     "I don't take RX for it" (12/30/2014)  . Daily headache   . Arthritis     back (12/30/2014)  . Anxiety   . Depression   . Malignant melanoma of skin of eyebrow ([place]) 2001     RIGHT SUPRAORBITAL (RIGHT FOREHEAD AND UPPER EYELID ----  S/P MOHS PROCDURE W/ SLN BX----   NO RECURRENCE  . 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 Takotsubo syndrome     "Broken heart syndrome"   Past Surgical History  Procedure Laterality Date  . Kidney surgery  1966    BILATERAL URETER'S DILATATION  . Melanoma excision with sentinel lymph node biopsy  2001    moh's procedure/  RIGHT FOREHEAD AND UPPER EYEBROW  . Lumbar laminectomy/decompression microdiscectomy  05/18/2011    Procedure: LUMBAR LAMINECTOMY/DECOMPRESSION MICRODISCECTOMY;  Surgeon: Johnn Hai;  Location: [facility];  Service: Orthopedics;  Laterality: Right;  Decompression Lumbar 4-Lumbar 5  Right    (xray)   . Cystoscopy with retrograde pyelogram, ureteroscopy and stent placement Bilateral 03/19/2013    Procedure: CYSTOSCOPY WITH RETROGRADE PYELOGRAM, BILATERAL URETEROSCOPY AND STENT PLACEMENT LEFT URETER,BILATERAL STONE  EXTRACTION , HOLMIUM LASER LEFT URETER;  Surgeon: Molli Hazard, MD;  Location: [facility];  Service: Urology;  Laterality: Bilateral;  . Right lateral parotidectomy w/ nerve dissection / right modified radical neck dissection sparing scm eleventh nerve and internal jugular vein  09-12-2006  DR DWIGHT BATES    DR DWIGHT BATES; "inside gland; lots of lymph nodes"  . Cardiovascular stress test  03-21-2012  DR [facility]    NORMAL NUCLEAR STUDY/  NO ISCHEMIA/  EF 63%  . Cystoscopy w/ ureteral stent placement Left 03/26/2013    Procedure: CYSTOSCOPY WITH RETROGRADE PYELOGRAM ;  Surgeon: Molli Hazard, MD;  Location: [facility];  Service: Urology;  Laterality: Left;  . Cystoscopy with stent placement Left 03/26/2013    Procedure: CYSTOSCOPY WITH STENT PLACEMENT;  Surgeon: Molli Hazard, MD;  Location: [facility];  Service: Urology;  Laterality: Left;  . Cardiac catheterization  2006 (APPROX)  [place]    NORMAL  . Cardiac catheterization  12/30/2014  . Back surgery    . Breast biopsy Bilateral early 2000's  . Diagnostic laparoscopy  04-12-2009  . Cardiac catheterization N/A 12/30/2014    Procedure: Left Heart Cath and Coronary Angiography;  Surgeon: Wellington Hampshire, MD;  Location: [facility];  Service: Cardiovascular;  Laterality: N/A;   Family History  Problem Relation Age of Onset  . Hypertension Mother   . COPD Mother   . Cancer Mother     breast  . Dementia Mother   . Heart disease Father   . Cancer Father     Colorectal  . Hyperlipidemia Father   . Hypertension Father    Social History  Substance Use Topics  . Smoking status: Never Smoker   . Smokeless tobacco: Never Used  . Alcohol Use: Yes     Comment: 12/30/2014 "might have a beer or glass of wine maybe once/month"   OB History    No data available     Review of Systems  Constitutional: Negative for fever and chills.  HENT: Negative for rhinorrhea and sore throat.    Eyes: Negative for visual disturbance.  Respiratory: Negative for cough and shortness of breath.   Cardiovascular: Negative for chest pain and leg swelling.  Gastrointestinal: Negative for nausea, vomiting, abdominal pain and diarrhea.  Genitourinary: Positive for dysuria.  Musculoskeletal: Positive for back pain.  Skin: Positive for rash (chronic).  Neurological: Positive for headaches (chronic).  Hematological: Does not bruise/bleed easily.      Allergies  Cephalexin  Home Medications   Prior to Admission medications   Medication Sig Start Date End Date Taking? Authorizing Provider  aspirin 81 MG chewable tablet Chew 81 mg by mouth daily.   Yes Historical Provider, MD  atorvastatin (LIPITOR) 20 MG tablet Take 1 tablet (20 mg total) by mouth daily. 12/31/14  Yes Bhavinkumar Bhagat, PA  Calcium Carb-Cholecalciferol (CALCIUM 600 + D) 600-200 MG-UNIT TABS Take 2 tablets by mouth daily.    Yes Historical Provider, MD  carvedilol (COREG) 3.125 MG tablet Take 1 tablet (  3.125 mg total) by mouth 2 (two) times daily with a meal. 12/31/14  Yes Bhavinkumar Bhagat, PA  gabapentin (NEURONTIN) 300 MG capsule Take 300 mg by mouth 2 (two) times daily. 02/07/15  Yes Historical Provider, MD  lisinopril (PRINIVIL,ZESTRIL) 2.5 MG tablet Take 1 tablet (2.5 mg total) by mouth daily. 12/31/14  Yes Bhavinkumar Bhagat, PA  Multiple Vitamin (MULTIVITAMIN) tablet Take 1 tablet by mouth daily.   Yes Historical Provider, MD  nitrofurantoin (MACRODANTIN) 100 MG capsule Take 1 capsule (100 mg total) by mouth 2 (two) times daily. 04/02/15  Yes Claretta Fraise, MD  nitroGLYCERIN (NITROSTAT) 0.4 MG SL tablet Place 1 tablet (0.4 mg total) under the tongue every 5 (five) minutes as needed for chest pain. 02/11/15  Yes Josue Hector, MD  clonazepam (KLONOPIN) 0.125 MG disintegrating tablet Take 0.125 mg by mouth daily as needed (for anxiety).  12/28/14   Historical Provider, MD  HYDROcodone-acetaminophen (NORCO/VICODIN) 5-325  MG tablet Take 1-2 tablets by mouth every 6 (six) hours as needed. 04/07/15   Fredia Sorrow, MD  ketotifen (THERA TEARS ALLERGY) 0.025 % ophthalmic solution Apply 1 drop to eye daily as needed (for dry eye relief).    Historical Provider, MD  ondansetron (ZOFRAN-ODT) 8 MG disintegrating tablet Take 1 tablet (8 mg total) by mouth every 6 (six) hours as needed for nausea or vomiting. 11/09/14   Claretta Fraise, MD  pantoprazole (PROTONIX) 40 MG tablet Take 40 mg by mouth daily. New rx not yet started 04/06/15   Historical Provider, MD  phenazopyridine (PYRIDIUM) 200 MG tablet Take 1 tablet (200 mg total) by mouth 3 (three) times daily. 04/07/15   Fredia Sorrow, MD   BP 128/62 mmHg  Pulse 60  Temp(Src) 97.9 F (36.6 C) (Oral)  Resp 14  Ht 5\' 4"  (1.626 m)  Wt 175 lb (79.379 kg)  BMI 30.02 kg/m2  SpO2 99%  LMP 05/18/2011 Physical Exam  Constitutional: She is oriented to person, place, and time. She appears well-developed and well-nourished. No distress.  HENT:  Head: Normocephalic and atraumatic.  Mouth/Throat: Oropharynx is clear and moist.  Mucous membranes are moist.  Eyes: Conjunctivae and EOM are normal. Pupils are equal, round, and reactive to light.  Pupils are normal. Sclera is clear. Eyes track normally.   Neck: Neck supple. No tracheal deviation present.  Cardiovascular: Normal rate, regular rhythm and normal heart sounds.   No murmur heard. Pulmonary/Chest: Effort normal and breath sounds normal. No respiratory distress. She has no wheezes. She has no rales.  Lungs are clear to auscultation bilaterally.  Abdominal: Soft. Bowel sounds are normal. There is no tenderness.  Genitourinary:  No CVA tenderness.  Musculoskeletal: Normal range of motion. She exhibits tenderness. She exhibits no edema.  TTP in lower lumbar area.  Neurological: She is alert and oriented to person, place, and time.  Skin: Skin is warm and dry.  Psychiatric: She has a normal mood and affect. Her  behavior is normal.  Nursing note and vitals reviewed.   ED Course  Procedures (including critical care time)  DIAGNOSTIC STUDIES: Oxygen Saturation is 98% on RA, normal by my interpretation.    COORDINATION OF CARE: 11:49 AM - Discussed treatment plan with pt at bedside which includes lab tests, including UA, and CT abdomen and pelvis. Pt verbalized understanding and agreed to plan.   Labs Review Labs Reviewed  URINALYSIS, ROUTINE W REFLEX MICROSCOPIC (NOT AT [facility]) - Abnormal; Notable for the following:    Hgb urine dipstick SMALL (*)  All other components within normal limits  URINE MICROSCOPIC-ADD ON - Abnormal; Notable for the following:    Squamous Epithelial / LPF FEW (*)    All other components within normal limits  CBC WITH DIFFERENTIAL/PLATELET - Abnormal; Notable for the following:    WBC 3.3 (*)    HCT 35.9 (*)    Platelets 121 (*)    All other components within normal limits  BASIC METABOLIC PANEL   Results for orders placed or performed during the hospital encounter of 04/07/15  Urinalysis, Routine w reflex microscopic-may I&O cath if menses (not at [facility])  Result Value Ref Range   Color, Urine YELLOW YELLOW   APPearance CLEAR CLEAR   Specific Gravity, Urine 1.010 1.005 - 1.030   pH 6.0 5.0 - 8.0   Glucose, UA NEGATIVE NEGATIVE mg/dL   Hgb urine dipstick SMALL (A) NEGATIVE   Bilirubin Urine NEGATIVE NEGATIVE   Ketones, ur NEGATIVE NEGATIVE mg/dL   Protein, ur NEGATIVE NEGATIVE mg/dL   Urobilinogen, UA 0.2 0.0 - 1.0 mg/dL   Nitrite NEGATIVE NEGATIVE   Leukocytes, UA NEGATIVE NEGATIVE  Urine microscopic-add on  Result Value Ref Range   Squamous Epithelial / LPF FEW (A) RARE   RBC / HPF 3-6 <3 RBC/hpf  CBC with Differential/Platelet  Result Value Ref Range   WBC 3.3 (L) 4.0 - 10.5 K/uL   RBC 3.92 3.87 - 5.11 MIL/uL   Hemoglobin 12.4 12.0 - 15.0 g/dL   HCT 35.9 (L) 36.0 - 46.0 %   MCV 91.6 78.0 - 100.0 fL   MCH 31.6 26.0 - 34.0 pg   MCHC 34.5 30.0 -  36.0 g/dL   RDW 12.3 11.5 - 15.5 %   Platelets 121 (L) 150 - 400 K/uL   Neutrophils Relative % 59 %   Neutro Abs 2.0 1.7 - 7.7 K/uL   Lymphocytes Relative 31 %   Lymphs Abs 1.0 0.7 - 4.0 K/uL   Monocytes Relative 8 %   Monocytes Absolute 0.3 0.1 - 1.0 K/uL   Eosinophils Relative 2 %   Eosinophils Absolute 0.1 0.0 - 0.7 K/uL   Basophils Relative 0 %   Basophils Absolute 0.0 0.0 - 0.1 K/uL  Basic metabolic panel  Result Value Ref Range   Sodium 141 135 - 145 mmol/L   Potassium 3.8 3.5 - 5.1 mmol/L   Chloride 106 101 - 111 mmol/L   CO2 29 22 - 32 mmol/L   Glucose, Bld 95 65 - 99 mg/dL   BUN 10 6 - 20 mg/dL   Creatinine, Ser 0.74 0.44 - 1.00 mg/dL   Calcium 9.1 8.9 - 10.3 mg/dL   GFR calc non Af Amer >60 >60 mL/min   GFR calc Af Amer >60 >60 mL/min   Anion gap 6 5 - 15     Imaging Review Ct Abdomen Pelvis Wo Contrast  04/07/2015  CLINICAL DATA:  Bilateral low back pain for 13 days with dysuria EXAM: CT ABDOMEN AND PELVIS WITHOUT CONTRAST TECHNIQUE: Multidetector CT imaging of the abdomen and pelvis was performed following the standard protocol without IV contrast. COMPARISON:  12/03/2014 FINDINGS: Lower chest:  Mild lingular scarring.  Normal heart size. Hepatobiliary: Normal liver.  Normal gallbladder. Pancreas: Normal. Spleen: Normal. Adrenals/Urinary Tract: Normal adrenal glands. Normal right kidney. Nonobstructing left renal calculi. Mild left ureterectasis. Normal bladder. Stomach/Bowel: No bowel wall thickening. No bowel dilatation. Moderate amount of stool throughout the colon. No pneumatosis, pneumoperitoneum or portal venous gas. No abdominal or pelvic free fluid. Vascular/Lymphatic: Normal  caliber abdominal aorta. No abdominal or pelvic lymphadenopathy. Reproductive: Normal uterus.  No adnexal mass. Other: No fluid collection or hematoma. Musculoskeletal: No aggressive lytic or sclerotic osseous lesion. Mild broad-based disc osteophyte complex at T12-L1. IMPRESSION: 1. Punctate  nonobstructing left renal calculi. No ureteral or bladder calculi. Electronically Signed   By: Kathreen Devoid   On: 04/07/2015 12:49   I have personally reviewed and evaluated these images and lab results as part of my medical decision-making.  MDM   Final diagnoses:  Dysuria  Bilateral low back pain without sciatica   Urinalysis without evidence urinary tract infection. Symptoms could be related to interstitial cystitis.  Also back pain seems to be suggestive of musculoskeletal back pain since is made worse when first getting up and moving.  Will treat symptomatically. We'll give a course of Pyridium. Patient has an appointment to follow-up urology already scheduled.  CT scan showed no evidence of any ureteral stones. No other acute findings no skeletal acute findings either.  I, Kylin Dubs, personally performed the services described in this documentation. All medical record entries made by the scribe were at my direction and in my presence.  I have reviewed the chart and discharge instructions and agree that the record reflects my personal performance and is accurate and complete. Arieon Scalzo.  04/07/2015. 2:28 PM.       Fredia Sorrow, MD 04/07/15 1429

## 2015-04-07 NOTE — ED Notes (Signed)
MD at bedside. 

## 2015-04-07 NOTE — ED Notes (Signed)
Pt reports she has been to PCP x 2 since 03/27/15 for UTI. Pt was placed on Nitrofurantoin first and then started on Cipro because her symptoms weren't resolving. Pt reports her first urine culture came back "inconclusive" and the second one normal. Pt is c/o constant, dull, throbbing lower back pain and burning with urination. Pt reports the burning with urination just started 2-3 days ago but the lower back pain has been going on for a couple weeks. Denies fever, blood in her urine, nausea, vomiting. Pt reports taking Urostat at home without relief of burning with urination. Pt called Urologist and she cannot be seen until 04/19/15.

## 2015-04-07 NOTE — ED Notes (Signed)
Pt states that she has been treated for UTI x2 since 02/25/15- Urine cultures came back negative ,  However still on ATB at this time but having pain bilateral lower back pain  And burning with urination and frequency .Marland Kitchen Has hx of kidney stones but stated that this doesn't feel like one this time

## 2015-04-07 NOTE — Discharge Instructions (Signed)
Take medications as directed. Keep your appointment to follow-up with your urologist. Return for any new or worse symptoms.

## 2015-04-08 ENCOUNTER — Other Ambulatory Visit: Payer: BLUE CROSS/BLUE SHIELD

## 2015-04-09 ENCOUNTER — Encounter (HOSPITAL_COMMUNITY)
Admission: RE | Admit: 2015-04-09 | Discharge: 2015-04-09 | Disposition: A | Discharge status: Home / Self Care | Payer: BLUE CROSS/BLUE SHIELD | Source: Ambulatory Visit | Attending: Cardiovascular Disease | Admitting: Cardiovascular Disease

## 2015-04-09 ENCOUNTER — Encounter (HOSPITAL_COMMUNITY): Payer: BLUE CROSS/BLUE SHIELD

## 2015-04-09 DIAGNOSIS — I252 Old myocardial infarction: Secondary | ICD-10-CM | POA: Diagnosis not present

## 2015-04-12 ENCOUNTER — Encounter (HOSPITAL_COMMUNITY)
Admission: RE | Admit: 2015-04-12 | Discharge: 2015-04-12 | Disposition: A | Discharge status: Home / Self Care | Payer: BLUE CROSS/BLUE SHIELD | Source: Ambulatory Visit | Attending: Cardiovascular Disease | Admitting: Cardiovascular Disease

## 2015-04-12 DIAGNOSIS — I252 Old myocardial infarction: Secondary | ICD-10-CM | POA: Diagnosis not present

## 2015-04-14 ENCOUNTER — Encounter (HOSPITAL_COMMUNITY)
Admission: RE | Admit: 2015-04-14 | Discharge: 2015-04-14 | Disposition: A | Discharge status: Home / Self Care | Payer: BLUE CROSS/BLUE SHIELD | Source: Ambulatory Visit | Attending: Cardiovascular Disease | Admitting: Cardiovascular Disease

## 2015-04-14 DIAGNOSIS — I252 Old myocardial infarction: Secondary | ICD-10-CM | POA: Diagnosis not present

## 2015-04-16 ENCOUNTER — Encounter (HOSPITAL_COMMUNITY)
Admission: RE | Admit: 2015-04-16 | Discharge: 2015-04-16 | Disposition: A | Discharge status: Home / Self Care | Payer: BLUE CROSS/BLUE SHIELD | Source: Ambulatory Visit | Attending: Cardiovascular Disease | Admitting: Cardiovascular Disease

## 2015-04-16 DIAGNOSIS — I252 Old myocardial infarction: Secondary | ICD-10-CM | POA: Diagnosis not present

## 2015-04-19 ENCOUNTER — Encounter (HOSPITAL_COMMUNITY): Payer: BLUE CROSS/BLUE SHIELD

## 2015-04-19 DIAGNOSIS — G8929 Other chronic pain: Secondary | ICD-10-CM | POA: Insufficient documentation

## 2015-04-21 ENCOUNTER — Encounter (HOSPITAL_COMMUNITY)
Admission: RE | Admit: 2015-04-21 | Discharge: 2015-04-21 | Disposition: A | Discharge status: Home / Self Care | Payer: BLUE CROSS/BLUE SHIELD | Source: Ambulatory Visit | Attending: Cardiovascular Disease | Admitting: Cardiovascular Disease

## 2015-04-21 DIAGNOSIS — I5181 Takotsubo syndrome: Secondary | ICD-10-CM | POA: Diagnosis not present

## 2015-04-21 DIAGNOSIS — I252 Old myocardial infarction: Secondary | ICD-10-CM | POA: Insufficient documentation

## 2015-04-23 ENCOUNTER — Encounter (HOSPITAL_COMMUNITY)
Admission: RE | Admit: 2015-04-23 | Discharge: 2015-04-23 | Disposition: A | Discharge status: Home / Self Care | Payer: BLUE CROSS/BLUE SHIELD | Source: Ambulatory Visit | Attending: Cardiovascular Disease | Admitting: Cardiovascular Disease

## 2015-04-23 DIAGNOSIS — I252 Old myocardial infarction: Secondary | ICD-10-CM | POA: Diagnosis not present

## 2015-04-26 ENCOUNTER — Encounter (HOSPITAL_COMMUNITY)
Admission: RE | Admit: 2015-04-26 | Discharge: 2015-04-26 | Disposition: A | Discharge status: Home / Self Care | Payer: BLUE CROSS/BLUE SHIELD | Source: Ambulatory Visit | Attending: Cardiovascular Disease | Admitting: Cardiovascular Disease

## 2015-04-26 DIAGNOSIS — I252 Old myocardial infarction: Secondary | ICD-10-CM | POA: Diagnosis not present

## 2015-04-28 ENCOUNTER — Encounter (HOSPITAL_COMMUNITY): Payer: BLUE CROSS/BLUE SHIELD

## 2015-04-30 ENCOUNTER — Encounter (HOSPITAL_COMMUNITY)
Admission: RE | Admit: 2015-04-30 | Discharge: 2015-04-30 | Disposition: A | Discharge status: Home / Self Care | Payer: BLUE CROSS/BLUE SHIELD | Source: Ambulatory Visit | Attending: Cardiovascular Disease | Admitting: Cardiovascular Disease

## 2015-04-30 DIAGNOSIS — I252 Old myocardial infarction: Secondary | ICD-10-CM | POA: Diagnosis not present

## 2015-05-03 ENCOUNTER — Encounter (HOSPITAL_COMMUNITY): Payer: BLUE CROSS/BLUE SHIELD

## 2015-05-04 NOTE — Progress Notes (Signed)
Patient is discharged from [place] and Pulmonary program today, 04/30/15 with 36 sessions.  She achieved LTG of 30 minutes of aerobic exercise at max met level of 3.55.  All patient vitals are WNL.  Patient has met with dietician.  Discharge instructions have been reviewed in detail and patient expressed an understanding of material given.  Patient plans to exercise at home. Cardiac Rehab will make 1 month, 6 month and 1 year call backs.  Patient had no complaints of any abnormal S/S or pain on their exit visit.  Patient finished post walk test.

## 2015-05-04 NOTE — Progress Notes (Signed)
Cardiac Rehabilitation Program Outcomes Report   Orientation:  01/26/15 Graduate Date:  04/30/15 Discharge Date:  04/30/15 # of sessions completed: 36  Cardiologist: Johnsie Cancel Family MD:  Stacks Class Time:  0930  A.  Exercise Program:  Tolerates exercise @ 3.55 METS for 15 minutes, Walk Test Results:  Post: 3.25, Improved functional capacity  15.2 % and Improved  muscular strength  13.56 %  B.  Mental Health:  Good mental attitude, Quality of Life (QOL)  improvements:  Overall  34.4 %, Health/Functioning 22.25 %, Socioeconomics 37.04 %, Psych/Spiritual 50 %, Family 54.55 %   and PHQ-9: 7. patient stated answer were no related to depression but to her health.   C.  Education/Instruction/Skills  Accurately checks own pulse.  Rest:  87  Exercise:  106, Knows THR for exercise and Attended 13 education classes  Uses Perceived Exertion Scale and/or Dyspnea Scale  D.  Nutrition/Weight Control/Body Composition:  Adherence to prescribed nutrition program: good  and Patient has gained 0.7 kg   E.  Blood Lipids    Lab Results  Component Value Date   CHOL 149 03/27/2015   HDL 49 03/27/2015   LDLCALC 76 03/27/2015   TRIG 120 03/27/2015   CHOLHDL 5.2 12/31/2014    F.  Lifestyle Changes:  Making positive lifestyle changes. Never smoker  G.  Symptoms noted with exercise:  Asymptomatic  Report Completed By:  Stevphen Rochester RN   Comments:  This is the patients graduation progress note for [facility].  Patient done well in program and plans to exercise at home.

## 2015-05-14 DIAGNOSIS — N200 Calculus of kidney: Secondary | ICD-10-CM | POA: Insufficient documentation

## 2015-06-03 ENCOUNTER — Ambulatory Visit (HOSPITAL_COMMUNITY): Admit: 2015-06-03 | Payer: Self-pay | Admitting: Gastroenterology

## 2015-06-03 ENCOUNTER — Encounter (HOSPITAL_COMMUNITY): Payer: Self-pay

## 2015-06-03 SURGERY — ESOPHAGOGASTRODUODENOSCOPY (EGD) WITH PROPOFOL
Anesthesia: Monitor Anesthesia Care

## 2015-06-11 ENCOUNTER — Other Ambulatory Visit: Payer: Self-pay

## 2015-06-11 ENCOUNTER — Ambulatory Visit (INDEPENDENT_AMBULATORY_CARE_PROVIDER_SITE_OTHER): Payer: BLUE CROSS/BLUE SHIELD | Admitting: Family Medicine

## 2015-06-11 ENCOUNTER — Encounter: Payer: Self-pay | Admitting: Family Medicine

## 2015-06-11 VITALS — BP 121/74 | HR 72 | Temp 97.0°F | Ht 64.0 in | Wt 186.8 lb

## 2015-06-11 DIAGNOSIS — M62838 Other muscle spasm: Secondary | ICD-10-CM

## 2015-06-11 DIAGNOSIS — R220 Localized swelling, mass and lump, head: Secondary | ICD-10-CM

## 2015-06-11 DIAGNOSIS — Z1231 Encounter for screening mammogram for malignant neoplasm of breast: Secondary | ICD-10-CM

## 2015-06-11 DIAGNOSIS — M6248 Contracture of muscle, other site: Secondary | ICD-10-CM

## 2015-06-11 MED ORDER — TIZANIDINE HCL 2 MG PO CAPS: 2.0000 mg | ORAL_CAPSULE | Freq: Three times a day (TID) | ORAL | Status: DC

## 2015-06-11 MED ORDER — PREDNISONE 20 MG PO TABS: ORAL_TABLET | ORAL | Status: DC

## 2015-06-11 NOTE — Progress Notes (Signed)
BP 121/74 mmHg  Pulse 72  Temp(Src) 97 F (36.1 C) (Oral)  Ht 5\' 4"  (1.626 m)  Wt 186 lb 12.8 oz (84.732 kg)  BMI 32.05 kg/m2  SpO2 98%  LMP 05/18/2011   Subjective:    Patient ID: [name], female    DOB: 11-27-1960, 54 y.o.   MRN: [id number]  HPI: [name] is a 54 y.o. female presenting on 06/11/2015 for Swelling in face, throat and ear   HPI Patient swelling right  Patient presents today for with right-sided facial swelling and feels like she has swelling or tickle in her throat. This is been going on for the past day. She saw her gynecologist yesterday and he noticed that there was possibly some swelling on that side of her face and neck. She feels like it is increased overnight since that time. He has an appointment with her ENT doc in January but came here because she could not get there any sooner. 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 She denies any fevers or chills. She denies any overlying skin changes of erythema or warmth. Eyes any sick contacts that she knows of. She also has tight muscles on that side of her neck and has been having issues with that for some time. She is taking Robaxin for those previously but it gave her some side effects that she did not like.  Relevant past medical, surgical, family and social history reviewed and updated as indicated. Interim medical history since our last visit reviewed. Allergies and medications reviewed and updated.  Review of Systems  Constitutional: Negative for fever and chills.  HENT: Positive for facial swelling (Right-sided facial swelling) and sore throat (feels like she has swelling in her throat as well). Negative for congestion, ear discharge, ear pain, rhinorrhea, sinus pressure and sneezing.   Eyes: Negative for redness and visual disturbance.  Respiratory: Negative for cough, chest tightness and shortness of  breath.   Cardiovascular: Negative for chest pain and leg swelling.  Genitourinary: Negative for dysuria and difficulty urinating.  Musculoskeletal: Negative for back pain and gait problem.  Skin: Negative for rash.  Neurological: Negative for light-headedness and headaches.  Psychiatric/Behavioral: Negative for behavioral problems and agitation.  All other systems reviewed and are negative.   Per HPI unless specifically indicated above     Medication List       This list is accurate as of: 06/11/15  3:52 PM.  Always use your most recent med list.               aspirin 81 MG chewable tablet  Chew 81 mg by mouth daily.     atorvastatin 20 MG tablet  Commonly known as:  LIPITOR  Take 1 tablet (20 mg total) by mouth daily.     CALCIUM 600 + D 600-200 MG-UNIT Tabs  Generic drug:  Calcium Carb-Cholecalciferol  Take 2 tablets by mouth daily.     carvedilol 3.125 MG tablet  Commonly known as:  COREG  Take 1 tablet (3.125 mg total) by mouth 2 (two) times daily with a meal.     clonazepam 0.125 MG disintegrating tablet  Commonly known as:  KLONOPIN  Take 0.125 mg by mouth daily as needed (for anxiety).     gabapentin 300 MG capsule  Commonly known as:  NEURONTIN  Take 300 mg by mouth 2 (two) times daily.  HYDROcodone-acetaminophen 5-325 MG tablet  Commonly known as:  NORCO/VICODIN  Take 1-2 tablets by mouth every 6 (six) hours as needed.     lisinopril 2.5 MG tablet  Commonly known as:  PRINIVIL,ZESTRIL  Take 1 tablet (2.5 mg total) by mouth daily.     multivitamin tablet  Take 1 tablet by mouth daily.     nitrofurantoin 100 MG capsule  Commonly known as:  MACRODANTIN  Take 1 capsule (100 mg total) by mouth 2 (two) times daily.     nitroGLYCERIN 0.4 MG SL tablet  Commonly known as:  NITROSTAT  Place 1 tablet (0.4 mg total) under the tongue every 5 (five) minutes as needed for chest pain.     ondansetron 8 MG disintegrating tablet  Commonly known as:   ZOFRAN-ODT  Take 1 tablet (8 mg total) by mouth every 6 (six) hours as needed for nausea or vomiting.     pantoprazole 40 MG tablet  Commonly known as:  PROTONIX  Take 40 mg by mouth 2 (two) times daily. New rx not yet started     phenazopyridine 200 MG tablet  Commonly known as:  PYRIDIUM  Take 1 tablet (200 mg total) by mouth 3 (three) times daily.     predniSONE 20 MG tablet  Commonly known as:  DELTASONE  2 po at same time daily for 5 days     THERATEARS ALLERGY 0.025 % ophthalmic solution  Generic drug:  ketotifen  Apply 1 drop to eye daily as needed (for dry eye relief).     tizanidine 2 MG capsule  Commonly known as:  ZANAFLEX  Take 1 capsule (2 mg total) by mouth 3 (three) times daily.           Objective:    BP 121/74 mmHg  Pulse 72  Temp(Src) 97 F (36.1 C) (Oral)  Ht 5\' 4"  (1.626 m)  Wt 186 lb 12.8 oz (84.732 kg)  BMI 32.05 kg/m2  SpO2 98%  LMP 05/18/2011  Wt Readings from Last 3 Encounters:  06/11/15 186 lb 12.8 oz (84.732 kg)  04/07/15 175 lb (79.379 kg)  03/27/15 175 lb 3.2 oz (79.47 kg)    Physical Exam  Constitutional: She is oriented to person, place, and time. She appears well-developed and well-nourished. No distress.  HENT:  Right Ear: External ear normal.  Left Ear: External ear normal.  Nose: Nose normal.  Mouth/Throat: Uvula is midline, oropharynx is clear and moist and mucous membranes are normal. No oropharyngeal exudate, posterior oropharyngeal edema, posterior oropharyngeal erythema or tonsillar abscesses.  Eyes: Conjunctivae and EOM are normal. Pupils are equal, round, and reactive to light.  Neck: Neck supple. Edema (Small amount of edema on the right side of her neck and right side of her face.) present. No erythema and normal range of motion present. No thyromegaly present.    Cardiovascular: Normal rate, regular rhythm, normal heart sounds and intact distal pulses.   No murmur heard. Pulmonary/Chest: Effort normal and breath  sounds normal. No respiratory distress. She has no wheezes.  Musculoskeletal: Normal range of motion. She exhibits no edema or tenderness.  Lymphadenopathy:    She has no cervical adenopathy.  Neurological: She is alert and oriented to person, place, and time. Coordination normal.  Skin: Skin is warm and dry. No rash noted. She is not diaphoretic.  Psychiatric: She has a normal mood and affect. Her behavior is normal.  Nursing note and vitals reviewed.      Assessment & Plan:  Problem List Items Addressed This Visit    None    Visit Diagnoses    Swelling of face    -  Primary    Likely lymphedema from her previous surgeries, if worsens go to the emergency department    Relevant Medications    predniSONE (DELTASONE) 20 MG tablet    Muscle spasms of neck        Relevant Medications    tizanidine (ZANAFLEX) 2 MG capsule        Follow up plan: Return if symptoms worsen or fail to improve.  Counseling provided for all of the vaccine components No orders of the defined types were placed in this encounter.    Caryl Pina, MD [facility] 06/11/2015, 3:52 PM

## 2015-06-18 ENCOUNTER — Encounter: Payer: Self-pay | Admitting: Pediatrics

## 2015-06-18 ENCOUNTER — Ambulatory Visit (INDEPENDENT_AMBULATORY_CARE_PROVIDER_SITE_OTHER): Payer: BLUE CROSS/BLUE SHIELD | Admitting: Pediatrics

## 2015-06-18 VITALS — BP 89/63 | HR 78 | Temp 97.5°F | Ht 64.0 in | Wt 188.2 lb

## 2015-06-18 DIAGNOSIS — H6501 Acute serous otitis media, right ear: Secondary | ICD-10-CM | POA: Diagnosis not present

## 2015-06-18 DIAGNOSIS — R220 Localized swelling, mass and lump, head: Secondary | ICD-10-CM

## 2015-06-18 MED ORDER — AMOXICILLIN 500 MG PO CAPS: 500.0000 mg | ORAL_CAPSULE | Freq: Two times a day (BID) | ORAL | Status: DC

## 2015-06-18 NOTE — Patient Instructions (Signed)
Start antibiotic of you start to have fevers or R ear pain  Continue allergy medicine daily  Take ibuprofen  Chloraseptic throat spray for sore throat

## 2015-06-18 NOTE — Progress Notes (Signed)
Subjective:    Patient ID: [name], female    DOB: 1960-11-10, 54 y.o.   MRN: [id number]  CC: Sore Throat and Ear Pain   HPI: [name] is a 54 y.o. 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 Yesterday woke up eye swollen and ear back swollen again Now having mostly throat pain, painful to swallow No fevers, no weight loss Normal appetite   Depression screen [facility] 2/9 06/18/2015 06/11/2015 01/26/2015 10/30/2014  Decreased Interest 0 0 0 0  Down, Depressed, Hopeless 0 0 0 3  PHQ - 2 Score 0 0 0 3  Altered sleeping - - - 0  Tired, decreased energy - - - 0  Change in appetite - - - 0  Feeling bad or failure about yourself  - - - 0  Trouble concentrating - - - 0  Moving slowly or fidgety/restless - - - 0  Suicidal thoughts - - - 0  PHQ-9 Score - - - 3     Relevant past medical, surgical, family and social history reviewed and updated as indicated. Interim medical history since our last visit reviewed. Allergies and medications reviewed and updated.    ROS: Per HPI unless specifically indicated above  History  Smoking status  . Never Smoker   Smokeless tobacco  . Never Used    Past Medical History Patient Active Problem List   Diagnosis Date Noted  . Urinary tract infection, site not specified 03/27/2015  . Cardiomyopathy ([place])   . ICM- EF 30-35% at cath 12/30/14-improved to 45% by echo 01/06/15 01/06/2015  . Orthostatic hypotension 01/06/2015  . Near syncope 01/05/2015  . IBS (irritable bowel syndrome) 01/05/2015  . Biliary dyskinesia 01/05/2015  . Recent NSTEMI ([place] event 12/30/14) 12/29/2014  . GERD (gastroesophageal reflux disease) 03/21/2012        Objective:    BP 89/63 mmHg  Pulse 78  Temp(Src) 97.5 F (36.4 C) (Oral)  Ht 5'  4" (1.626 m)  Wt 188 lb 3.2 oz (85.367 kg)  BMI 32.29 kg/m2  LMP 05/18/2011  Wt Readings from Last 3 Encounters:  06/18/15 188 lb 3.2 oz (85.367 kg)  06/11/15 186 lb 12.8 oz (84.732 kg)  04/07/15 175 lb (79.379 kg)     Gen: NAD, alert, cooperative with exam, NCAT, face symmetric EYES: EOMI, no scleral injection or icterus ENT:  R TM non-erythematous, splayed LR, with clear fluid effusion, air bubbles behind TM, L TM dull gray b/l, OP without erythema, no masses or erythema, tonsils not visible, OP symmetric, palate raises symmetrically Neck: hard areas R side of neck where radiation treatments completed, no posterior or ant cervical LAD CV: NRRR, normal S1/S2, no murmur, distal pulses 2+ b/l Resp: CTABL, no wheezes, normal WOB Abd: +BS, soft, NTND. no guarding or organomegaly Ext: No edema, warm Neuro: Alert and oriented     Assessment & Plan:    Luz was seen today for sore throat and ear pain, found to have serous effusion R TM. No swelling now on R side of face, but still with R sided throat pain, normal throat exam. H/o 2 head and neck cancers, will refer back to ENT for evaluation. Gave Rx for amoxicillin, if starts to have ear pain or fevers start amoxicillin.  Diagnoses and all  orders for this visit:  Right acute serous otitis media, recurrence not specified -     amoxicillin (AMOXIL) 500 MG capsule; Take 1 capsule (500 mg total) by mouth 2 (two) times daily.  Swelling of right side of face -     Ambulatory referral to ENT   Follow up plan: 4 weeks, sooner if needed  Assunta Found, MD [facility] 06/18/2015, 11:42 AM

## 2015-06-21 ENCOUNTER — Emergency Department (HOSPITAL_COMMUNITY): Payer: BLUE CROSS/BLUE SHIELD

## 2015-06-21 ENCOUNTER — Encounter (HOSPITAL_COMMUNITY): Payer: Self-pay | Admitting: Emergency Medicine

## 2015-06-21 ENCOUNTER — Emergency Department (HOSPITAL_COMMUNITY)
Admission: EM | Admit: 2015-06-21 | Discharge: 2015-06-22 | Disposition: A | Discharge status: Home / Self Care | Payer: BLUE CROSS/BLUE SHIELD | Attending: Emergency Medicine | Admitting: Emergency Medicine

## 2015-06-21 DIAGNOSIS — R079 Chest pain, unspecified: Secondary | ICD-10-CM | POA: Diagnosis present

## 2015-06-21 DIAGNOSIS — Z79899 Other long term (current) drug therapy: Secondary | ICD-10-CM | POA: Diagnosis not present

## 2015-06-21 DIAGNOSIS — Z87442 Personal history of urinary calculi: Secondary | ICD-10-CM | POA: Diagnosis not present

## 2015-06-21 DIAGNOSIS — R1012 Left upper quadrant pain: Secondary | ICD-10-CM | POA: Insufficient documentation

## 2015-06-21 DIAGNOSIS — F329 Major depressive disorder, single episode, unspecified: Secondary | ICD-10-CM | POA: Diagnosis not present

## 2015-06-21 DIAGNOSIS — Z85820 Personal history of malignant melanoma of skin: Secondary | ICD-10-CM | POA: Diagnosis not present

## 2015-06-21 DIAGNOSIS — M1990 Unspecified osteoarthritis, unspecified site: Secondary | ICD-10-CM | POA: Insufficient documentation

## 2015-06-21 DIAGNOSIS — Z85818 Personal history of malignant neoplasm of other sites of lip, oral cavity, and pharynx: Secondary | ICD-10-CM | POA: Insufficient documentation

## 2015-06-21 DIAGNOSIS — I252 Old myocardial infarction: Secondary | ICD-10-CM | POA: Insufficient documentation

## 2015-06-21 DIAGNOSIS — E7800 Pure hypercholesterolemia, unspecified: Secondary | ICD-10-CM | POA: Insufficient documentation

## 2015-06-21 DIAGNOSIS — Z9889 Other specified postprocedural states: Secondary | ICD-10-CM | POA: Insufficient documentation

## 2015-06-21 DIAGNOSIS — Z87718 Personal history of other specified (corrected) congenital malformations of genitourinary system: Secondary | ICD-10-CM | POA: Diagnosis not present

## 2015-06-21 DIAGNOSIS — Z7982 Long term (current) use of aspirin: Secondary | ICD-10-CM | POA: Diagnosis not present

## 2015-06-21 DIAGNOSIS — R091 Pleurisy: Secondary | ICD-10-CM

## 2015-06-21 DIAGNOSIS — K219 Gastro-esophageal reflux disease without esophagitis: Secondary | ICD-10-CM | POA: Diagnosis not present

## 2015-06-21 DIAGNOSIS — J45909 Unspecified asthma, uncomplicated: Secondary | ICD-10-CM | POA: Diagnosis not present

## 2015-06-21 DIAGNOSIS — R109 Unspecified abdominal pain: Secondary | ICD-10-CM

## 2015-06-21 DIAGNOSIS — F419 Anxiety disorder, unspecified: Secondary | ICD-10-CM | POA: Diagnosis not present

## 2015-06-21 LAB — CBC
HEMATOCRIT: 35.1 % — AB (ref 36.0–46.0)
HEMOGLOBIN: 11.9 g/dL — AB (ref 12.0–15.0)
MCH: 32.2 pg (ref 26.0–34.0)
MCHC: 33.9 g/dL (ref 30.0–36.0)
MCV: 95.1 fL (ref 78.0–100.0)
Platelets: 125 10*3/uL — ABNORMAL LOW (ref 150–400)
RBC: 3.69 MIL/uL — ABNORMAL LOW (ref 3.87–5.11)
RDW: 12.4 % (ref 11.5–15.5)
WBC: 5.8 10*3/uL (ref 4.0–10.5)

## 2015-06-21 LAB — BASIC METABOLIC PANEL
ANION GAP: 6 (ref 5–15)
BUN: 18 mg/dL (ref 6–20)
CO2: 30 mmol/L (ref 22–32)
Calcium: 9.3 mg/dL (ref 8.9–10.3)
Chloride: 104 mmol/L (ref 101–111)
Creatinine, Ser: 0.77 mg/dL (ref 0.44–1.00)
GFR calc Af Amer: >60 mL/min (ref >60)
Glucose, Bld: 94 mg/dL (ref 65–99)
POTASSIUM: 4.3 mmol/L (ref 3.5–5.1)
SODIUM: 140 mmol/L (ref 135–145)

## 2015-06-21 LAB — TROPONIN I: Troponin I: <0.03 ng/mL (ref <0.031)

## 2015-06-21 LAB — D-DIMER, QUANTITATIVE (NOT AT ARMC): D DIMER QUANT: 0.74 ug{FEU}/mL — AB (ref 0.00–0.50)

## 2015-06-21 MED ORDER — SODIUM CHLORIDE 0.9 % IV BOLUS (SEPSIS)
1000.0000 mL | Freq: Once | INTRAVENOUS | Status: AC
  Administered 2015-06-21: 1000 mL via INTRAVENOUS

## 2015-06-21 MED ORDER — IOHEXOL 350 MG/ML SOLN
100.0000 mL | Freq: Once | INTRAVENOUS | Status: AC | PRN
  Administered 2015-06-21: 100 mL via INTRAVENOUS

## 2015-06-21 NOTE — ED Provider Notes (Signed)
CSN: [id number]     Arrival date & time 06/21/15  [id number] History  By signing my name below, I, Soijett Blue, attest that this documentation has been prepared under the direction and in the presence of Milton Ferguson, MD. Electronically Signed: Soijett Blue, ED Scribe. 06/21/2015. 9:18 PM.   Chief Complaint  Patient presents with  . Chest Pain      Patient is a 55 y.o. female presenting with chest pain. The history is provided by the patient (pt is complaining of left sided CP). No language interpreter was used.  Chest Pain Pain location:  L chest Pain quality: sharp   Pain radiates to:  Does not radiate Pain radiates to the back: no   Pain severity:  Moderate Onset quality:  Sudden Duration:  7 hours Timing:  Constant Progression:  Unchanged Chronicity:  New Context: at rest   Context: not breathing, not lifting and no movement   Worsened by:  Nothing tried Ineffective treatments: percocet. Associated symptoms: abdominal pain   Associated symptoms: no back pain, no cough, no diaphoresis, no fatigue, no headache and no shortness of breath     HPI Comments: [name] is a 55 y.o. female with a medical hx of GERD, hypercholesterolemia, who presents to the Emergency Department complaining of constant, sharp, left sided CP radiating to left breast onset today at 2:30 PM. She denies her CP being worsened with taking a deep breath or movement. She notes that she was at work when the CP began. She states that she is having associated symptoms of abdominal pain. She states that she has tried 2 percocet with no relief for her symptoms. She denies diaphoresis, SOB, and any other symptoms.   Past Medical History  Diagnosis Date  . PONV (postoperative nausea and vomiting)   . Left ureteral calculus   . History of palpitations     STRESS INDUCED  . Kidney stones   . Kidney cysts     "I think it was right"  . Hypercholesterolemia dx'd 12/2014  . Childhood asthma   . NSTEMI (non-ST  elevated myocardial infarction) ([place]) 12/28/2014  . GERD (gastroesophageal reflux disease)     "I don't take RX for it" (12/30/2014)  . Daily headache   . Arthritis     back (12/30/2014)  . Anxiety   . Depression   . 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 Takotsubo syndrome     "Broken heart syndrome"   Past Surgical History  Procedure Laterality Date  . Kidney surgery  1966    BILATERAL URETER'S DILATATION  . Melanoma excision with sentinel lymph node biopsy  2001    moh's procedure/  RIGHT FOREHEAD AND UPPER EYEBROW  . Lumbar laminectomy/decompression microdiscectomy  05/18/2011    Procedure: LUMBAR LAMINECTOMY/DECOMPRESSION MICRODISCECTOMY;  Surgeon: Johnn Hai;  Location: [facility];  Service: Orthopedics;  Laterality: Right;  Decompression Lumbar 4-Lumbar 5  Right    (xray)   . Cystoscopy with retrograde pyelogram, ureteroscopy and stent placement Bilateral 03/19/2013    Procedure: CYSTOSCOPY WITH RETROGRADE PYELOGRAM, BILATERAL URETEROSCOPY AND STENT PLACEMENT LEFT URETER,BILATERAL STONE EXTRACTION , HOLMIUM LASER LEFT URETER;  Surgeon: Dennard Schaumann  Jasmine December, MD;  Location: [facility];  Service: Urology;  Laterality: Bilateral;  . Right lateral parotidectomy w/ nerve dissection / right modified radical neck dissection sparing scm eleventh nerve and internal jugular vein  09-12-2006  DR DWIGHT BATES    DR DWIGHT BATES; "inside gland; lots of lymph nodes"  . Cardiovascular stress test  03-21-2012  DR [facility]    NORMAL NUCLEAR STUDY/  NO ISCHEMIA/  EF 63%  . Cystoscopy w/ ureteral stent placement Left 03/26/2013    Procedure: CYSTOSCOPY WITH RETROGRADE PYELOGRAM ;  Surgeon: Molli Hazard, MD;  Location:  [facility];  Service: Urology;  Laterality: Left;  . Cystoscopy with stent placement Left 03/26/2013    Procedure: CYSTOSCOPY WITH STENT PLACEMENT;  Surgeon: Molli Hazard, MD;  Location: [facility];  Service: Urology;  Laterality: Left;  . Cardiac catheterization  2006 (APPROX)  [place]    NORMAL  . Cardiac catheterization  12/30/2014  . Back surgery    . Breast biopsy Bilateral early 2000's  . Diagnostic laparoscopy  04-12-2009  . Cardiac catheterization N/A 12/30/2014    Procedure: Left Heart Cath and Coronary Angiography;  Surgeon: Wellington Hampshire, MD;  Location: [facility];  Service: Cardiovascular;  Laterality: N/A;   Family History  Problem Relation Age of Onset  . Hypertension Mother   . COPD Mother   . Cancer Mother     breast  . Dementia Mother   . Heart disease Father   . Cancer Father     Colorectal  . Hyperlipidemia Father   . Hypertension Father    Social History  Substance Use Topics  . Smoking status: Never Smoker   . Smokeless tobacco: Never Used  . Alcohol Use: Yes     Comment: 12/30/2014 "might have a beer or glass of wine maybe once/month"   OB History    No data available     Review of Systems  Constitutional: Negative for diaphoresis, appetite change and fatigue.  HENT: Negative for congestion, ear discharge and sinus pressure.   Eyes: Negative for discharge.  Respiratory: Negative for cough and shortness of breath.   Cardiovascular: Positive for chest pain.  Gastrointestinal: Positive for abdominal pain. Negative for diarrhea.  Genitourinary: Negative for frequency and hematuria.  Musculoskeletal: Negative for back pain.  Skin: Negative for rash.  Neurological: Negative for seizures and headaches.  Psychiatric/Behavioral: Negative for hallucinations.      Allergies  Cephalexin  Home Medications   Prior to Admission medications   Medication Sig Start Date End Date Taking? Authorizing  Provider  amoxicillin (AMOXIL) 500 MG capsule Take 1 capsule (500 mg total) by mouth 2 (two) times daily. 06/18/15  Yes Eustaquio Maize, MD  aspirin 81 MG chewable tablet Chew 81 mg by mouth daily.   Yes Historical Provider, MD  atorvastatin (LIPITOR) 20 MG tablet Take 1 tablet (20 mg total) by mouth daily. 12/31/14  Yes Bhavinkumar Bhagat, PA  Calcium Carb-Cholecalciferol (CALCIUM 600 + D) 600-200 MG-UNIT TABS Take 2 tablets by mouth daily.    Yes Historical Provider, MD  carvedilol (COREG) 3.125 MG tablet Take 1 tablet (3.125 mg total) by mouth 2 (two) times daily with a meal. 12/31/14  Yes Bhavinkumar Bhagat, PA  gabapentin (NEURONTIN) 300 MG capsule Take 300 mg by mouth 2 (two) times daily. 02/07/15  Yes Historical Provider, MD  ibuprofen (ADVIL,MOTRIN) 200 MG tablet Take 400 mg by mouth 2 (two) times daily.   Yes Historical Provider,  MD  ketotifen (THERA TEARS ALLERGY) 0.025 % ophthalmic solution Apply 1 drop to eye daily as needed (for dry eye relief).   Yes Historical Provider, MD  lisinopril (PRINIVIL,ZESTRIL) 2.5 MG tablet Take 1 tablet (2.5 mg total) by mouth daily. 12/31/14  Yes Bhavinkumar Bhagat, PA  Methylcellulose, Laxative, (CITRUCEL) 500 MG TABS Take 2 tablets by mouth daily.   Yes Historical Provider, MD  Multiple Vitamin (MULTIVITAMIN) tablet Take 1 tablet by mouth daily.   Yes Historical Provider, MD  nitroGLYCERIN (NITROSTAT) 0.4 MG SL tablet Place 1 tablet (0.4 mg total) under the tongue every 5 (five) minutes as needed for chest pain. 02/11/15  Yes Josue Hector, MD  ondansetron (ZOFRAN-ODT) 8 MG disintegrating tablet Take 1 tablet (8 mg total) by mouth every 6 (six) hours as needed for nausea or vomiting. 11/09/14  Yes Claretta Fraise, MD  oxyCODONE-acetaminophen (PERCOCET/ROXICET) 5-325 MG tablet Take 1-2 tablets by mouth every 6 (six) hours as needed for moderate pain or severe pain.  06/18/15  Yes Historical Provider, MD  pantoprazole (PROTONIX) 40 MG tablet Take 40 mg by mouth 2  (two) times daily.  04/06/15  Yes Historical Provider, MD  polyethylene glycol powder (GLYCOLAX/MIRALAX) powder Take 17 g by mouth daily as needed for mild constipation or moderate constipation.  06/18/15  Yes Historical Provider, MD  triamcinolone ointment (KENALOG) 0.1 % Apply 1 application topically daily as needed (for rash/itching).    Yes Historical Provider, MD  vitamin C (ASCORBIC ACID) 500 MG tablet Take 2,000 mg by mouth daily.   Yes Historical Provider, MD  clonazepam (KLONOPIN) 0.125 MG disintegrating tablet Take 0.125 mg by mouth daily as needed (for anxiety).  12/28/14   Historical Provider, MD  HYDROcodone-acetaminophen (NORCO/VICODIN) 5-325 MG tablet Take 1-2 tablets by mouth every 6 (six) hours as needed. Patient not taking: Reported on 06/21/2015 04/07/15   Fredia Sorrow, MD  nitrofurantoin (MACRODANTIN) 100 MG capsule Take 1 capsule (100 mg total) by mouth 2 (two) times daily. Patient not taking: Reported on 06/21/2015 04/02/15   Claretta Fraise, MD  phenazopyridine (PYRIDIUM) 200 MG tablet Take 1 tablet (200 mg total) by mouth 3 (three) times daily. Patient not taking: Reported on 06/21/2015 04/07/15   Fredia Sorrow, MD  predniSONE (DELTASONE) 20 MG tablet Take 20 mg by mouth as directed. Reported on 06/21/2015 06/11/15   Historical Provider, MD  tizanidine (ZANAFLEX) 2 MG capsule Take 1 capsule (2 mg total) by mouth 3 (three) times daily. Patient not taking: Reported on 06/21/2015 06/11/15   Fransisca Kaufmann Dettinger, MD   BP 98/60 mmHg  Pulse 69  Temp(Src) 97.8 F (36.6 C) (Oral)  Resp 16  Ht 5\' 4"  (1.626 m)  Wt 185 lb (83.915 kg)  BMI 31.74 kg/m2  SpO2 97%  LMP 05/18/2011 Physical Exam  Constitutional: She is oriented to person, place, and time. She appears well-developed.  HENT:  Head: Normocephalic.  Eyes: Conjunctivae and EOM are normal. No scleral icterus.  Neck: Neck supple. No thyromegaly present.  Cardiovascular: Normal rate and regular rhythm.  Exam reveals no gallop and  no friction rub.   No murmur heard. Pulmonary/Chest: No stridor. She has no wheezes. She has no rales. She exhibits no tenderness.  Abdominal: She exhibits no distension. There is tenderness in the left upper quadrant. There is no rebound.  Moderate LUQ tenderness  Musculoskeletal: Normal range of motion. She exhibits no edema.  Lymphadenopathy:    She has no cervical adenopathy.  Neurological: She is oriented to person, place,  and time. She exhibits normal muscle tone. Coordination normal.  Skin: No rash noted. No erythema.  Psychiatric: She has a normal mood and affect. Her behavior is normal.  Nursing note and vitals reviewed.   ED Course  Procedures (including critical care time) DIAGNOSTIC STUDIES: Oxygen Saturation is 97% on RA, nl by my interpretation.    COORDINATION OF CARE: 9:17 PM Discussed treatment plan with pt at bedside which includes labs, CXR, EKG, and pt agreed to plan.    Labs Review Labs Reviewed  CBC - Abnormal; Notable for the following:    RBC 3.69 (*)    Hemoglobin 11.9 (*)    HCT 35.1 (*)    Platelets 125 (*)    All other components within normal limits  D-DIMER, QUANTITATIVE (NOT AT [facility]) - Abnormal; Notable for the following:    D-Dimer, Quant 0.74 (*)    All other components within normal limits  BASIC METABOLIC PANEL  TROPONIN I    Imaging Review Dg Chest 2 View  06/21/2015  CLINICAL DATA:  Chest pain. Intermittent shortness of breath. History of melanoma. EXAM: CHEST  2 VIEW COMPARISON:  01/05/2015 FINDINGS: Moderate lower thoracic spondylosis. Midline trachea. Normal heart size and mediastinal contours. No pleural effusion or pneumothorax. Mild scarring at the left lung base. IMPRESSION: No active cardiopulmonary disease. Electronically Signed   By: Abigail Miyamoto M.D.   On: 06/21/2015 19:37   I have personally reviewed and evaluated these images and lab results as part of my medical decision-making.   EKG Interpretation   Date/Time:  Monday  June 21 2015 18:42:35 EST Ventricular Rate:  69 PR Interval:  138 QRS Duration: 78 QT Interval:  402 QTC Calculation: 430 R Axis:   -35 Text Interpretation:  Sinus rhythm with occasional Premature ventricular  complexes Left axis deviation Low voltage QRS T wave abnormality, consider  anterior ischemia Abnormal ECG Confirmed by Perpetua Elling  MD, Broadus John [id number]) on  06/21/2015 9:11:06 PM      MDM   Final diagnoses:  None   Labs EKG chest x-ray CT scan chest unremarkable. Doubt coronary artery disease. Will have patient follow-up with her PCP this week  Milton Ferguson, MD 06/22/15 0000

## 2015-06-21 NOTE — ED Notes (Signed)
Pt re[ports onset of L sided chaest pain that started today. Pt reports pain is under her L breast. Also c/o pain around her R shoulder and gas.

## 2015-06-22 LAB — HEPATIC FUNCTION PANEL
ALT: 21 U/L (ref 14–54)
AST: 24 U/L (ref 15–41)
Albumin: 4.2 g/dL (ref 3.5–5.0)
Alkaline Phosphatase: 58 U/L (ref 38–126)
BILIRUBIN DIRECT: 0.1 mg/dL (ref 0.1–0.5)
Indirect Bilirubin: 0.5 mg/dL (ref 0.3–0.9)
TOTAL PROTEIN: 7.1 g/dL (ref 6.5–8.1)
Total Bilirubin: 0.6 mg/dL (ref 0.3–1.2)

## 2015-06-22 LAB — LIPASE, BLOOD: LIPASE: 23 U/L (ref 11–51)

## 2015-06-22 MED ORDER — HYDROMORPHONE HCL 4 MG PO TABS: 4.0000 mg | ORAL_TABLET | Freq: Four times a day (QID) | ORAL | Status: DC | PRN

## 2015-06-22 NOTE — ED Notes (Signed)
Pt dissatisfied with answers given.  States "I've spent all this time and money and ya'll haven't told me anything".  Dr. Jeneen Rinks once again explained results were negative and that was reassuring. Pt ambulated from ED

## 2015-06-22 NOTE — Discharge Instructions (Signed)
Follow up with your md for recheck in 2-3 days °

## 2015-06-30 ENCOUNTER — Telehealth: Payer: Self-pay | Admitting: *Deleted

## 2015-06-30 NOTE — Telephone Encounter (Signed)
Patient was seen by ENT and was given medications and had ears cleaned. She will schedule an appointment here if she continues to have issues.

## 2015-07-08 ENCOUNTER — Ambulatory Visit
Admission: RE | Admit: 2015-07-08 | Discharge: 2015-07-08 | Disposition: A | Discharge status: Home / Self Care | Payer: BLUE CROSS/BLUE SHIELD | Source: Ambulatory Visit

## 2015-07-08 DIAGNOSIS — Z1231 Encounter for screening mammogram for malignant neoplasm of breast: Secondary | ICD-10-CM

## 2015-07-15 ENCOUNTER — Ambulatory Visit (INDEPENDENT_AMBULATORY_CARE_PROVIDER_SITE_OTHER): Payer: BLUE CROSS/BLUE SHIELD | Admitting: *Deleted

## 2015-07-15 DIAGNOSIS — Z23 Encounter for immunization: Secondary | ICD-10-CM | POA: Diagnosis not present

## 2015-07-15 NOTE — Patient Instructions (Signed)
Td Vaccine (Tetanus and Diphtheria): What You Need to Know  1. Why get vaccinated?  Tetanus  and diphtheria are very serious diseases. They are rare in the [place] today, but people who do become infected often have severe complications. Td vaccine is used to protect adolescents and adults from both of these diseases.  Both tetanus and diphtheria are infections caused by bacteria. Diphtheria spreads from person to person through coughing or sneezing. Tetanus-causing bacteria enter the body through cuts, scratches, or wounds.  TETANUS (Lockjaw) causes painful muscle tightening and stiffness, usually all over the body.  · It can lead to tightening of muscles in the head and neck so you can't open your mouth, swallow, or sometimes even breathe. Tetanus kills about 1 out of every 10 people who are infected even after receiving the best medical care.  DIPHTHERIA can cause a thick coating to form in the back of the throat.  · It can lead to breathing problems, paralysis, heart failure, and death.  Before vaccines, as many as 200,000 cases of diphtheria and hundreds of cases of tetanus were reported in the [place] each year. Since vaccination began, reports of cases for both diseases have dropped by about 99%.  2. Td vaccine  Td vaccine can protect adolescents and adults from tetanus and diphtheria. Td is usually given as a booster dose every 10 years but it can also be given earlier after a severe and dirty wound or burn.  Another vaccine, called Tdap, which protects against pertussis in addition to tetanus and diphtheria, is sometimes recommended instead of Td vaccine.  Your doctor or the person giving you the vaccine can give you more information.  Td may safely be given at the same time as other vaccines.  3. Some people should not get this vaccine  · A person who has ever had a life-threatening allergic reaction after a previous dose of any tetanus or diphtheria containing vaccine, OR has a severe allergy  to any part of this vaccine, should not get Td vaccine. Tell the person giving the vaccine about any severe allergies.  · Talk to your doctor if you:    have seizures or another nervous system problem,    had severe pain or swelling after any vaccine containing diphtheria or tetanus,    ever had a condition called Guillain Barre Syndrome (GBS),    aren't feeling well on the day the shot is scheduled.  4. Risks of a vaccine reaction  With any medicine, including vaccines, there is a chance of side effects. These are usually mild and go away on their own. Serious reactions are also possible but are rare.  Most people who get Td vaccine do not have any problems with it.  Mild Problems  following Td vaccine:  (Did not interfere with activities)  · Pain where the shot was given (about 8 people in 10)  · Redness or swelling where the shot was given (about 1 person in 4)  · Mild fever (rare)  · Headache (about 1 person in 4)  · Tiredness (about 1 person in 4)  Moderate Problems following Td vaccine:  (Interfered with activities, but did not require medical attention)  · Fever over 102°F (rare)  Severe Problems  following Td vaccine:  (Unable to perform usual activities; required medical attention)  · Swelling, severe pain, bleeding and/or redness in the arm where the shot was given (rare).  Problems that could happen after any vaccine:  · People sometimes   faint after a medical procedure, including vaccination. Sitting or lying down for about 15 minutes can help prevent fainting, and injuries caused by a fall. Tell your doctor if you feel dizzy, or have vision changes or ringing in the ears.  · Some people get severe pain in the shoulder and have difficulty moving the arm where a shot was given. This happens very rarely.  · Any medication can cause a severe allergic reaction. Such reactions from a vaccine are very rare, estimated at fewer than 1 in a million doses, and would happen within a few minutes to a few hours after  the vaccination.  As with any medicine, there is a very remote chance of a vaccine causing a serious injury or death.  The safety of vaccines is always being monitored. For more information, visit: www.cdc.gov/vaccinesafety/  5. What if there is a serious reaction?  What should I look for?  · Look for anything that concerns you, such as signs of a severe allergic reaction, very high fever, or unusual behavior.  Signs of a severe allergic reaction can include hives, swelling of the face and throat, difficulty breathing, a fast heartbeat, dizziness, and weakness. These would usually start a few minutes to a few hours after the vaccination.  What should I do?  · If you think it is a severe allergic reaction or other emergency that can't wait, call 9-1-1 or get the person to the nearest hospital. Otherwise, call your doctor.  · Afterward, the reaction should be reported to the Vaccine Adverse Event Reporting System (VAERS). Your doctor might file this report, or you can do it yourself through the VAERS web site at www.vaers.hhs.gov, or by calling 1-800-822-7967.  VAERS does not give medical advice.  6. The National Vaccine Injury Compensation Program  The National Vaccine Injury Compensation Program (VICP) is a federal program that was created to compensate people who may have been injured by certain vaccines.  Persons who believe they may have been injured by a vaccine can learn about the program and about filing a claim by calling 1-800-338-2382 or visiting the VICP website at www.hrsa.gov/vaccinecompensation. There is a time limit to file a claim for compensation.  7. How can I learn more?  · Ask your doctor. He or she can give you the vaccine package insert or suggest other sources of information.  · Call your local or state health department.  · Contact the Centers for Disease Control and Prevention (CDC):    Call 1-800-232-4636 (1-800-CDC-INFO)    Visit CDC's website at www.cdc.gov/vaccines  CDC Td Vaccine VIS  (08/12/13)     This information is not intended to replace advice given to you by your health care provider. Make sure you discuss any questions you have with your health care provider.     Document Released: 04/02/2006 Document Revised: 06/26/2014 Document Reviewed: 09/17/2013  Elsevier Interactive Patient Education ©2016 Elsevier Inc.

## 2015-07-15 NOTE — Progress Notes (Signed)
Tdap given and patient tolerated well.  

## 2015-08-05 NOTE — Progress Notes (Signed)
Patient ID: [name], female   DOB: 04-23-1961, 55 y.o.   MRN: [id number]   54 y.o. previous SSCP with negative w/u . .. Had normal cath in [facility] about 9 years ago Chronically abnormal ECG   Myovue 03/21/12 normal EF 65% Short burst of PSVT BP still borderline. 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  Cath with no CAD 1. Normal coronary arteries. 2. Moderately to severely reduced LV systolic function with an ejection fraction of 30-35% with severe hypokinesis of the mid distal anterior, apical and mid to distal inferior walls consistent with stress-induced cardiomyopathy. 3. Mildly elevated left ventricular end-diastolic pressure.  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  All other systems reviewed and negative  General: Affect appropriate Healthy:  appears stated age 32: normal Neck supple with no adenopathy JVP normal no bruits no thyromegaly Lungs clear with no wheezing and good diaphragmatic motion Heart:  S1/S2 no murmur, no rub, gallop or click PMI normal Abdomen: benighn, BS positve, no tenderness, no AAA no bruit.  No HSM or HJR Distal pulses intact with no bruits No edema Neuro non-focal Skin warm and dry No  muscular weakness   Current Outpatient Prescriptions  Medication Sig Dispense Refill  . aspirin 81 MG chewable tablet Chew 81 mg by mouth daily.    Marland Kitchen atorvastatin (LIPITOR) 20 MG tablet Take 1 tablet (20 mg total) by mouth daily. 30 tablet 11  . Calcium Carb-Cholecalciferol (CALCIUM 600 + D) 600-200 MG-UNIT TABS Take 2 tablets by mouth daily.     . carvedilol (COREG) 3.125 MG tablet Take 1 tablet (3.125 mg total) by mouth 2 (two) times daily with a meal. 60 tablet 11  . gabapentin (NEURONTIN) 300 MG capsule Take 300 mg by mouth 2 (two) times daily.    Marland Kitchen ketotifen (THERA TEARS ALLERGY) 0.025 % ophthalmic solution Apply 1 drop to eye daily as needed (for dry eye relief).    Marland Kitchen lisinopril (PRINIVIL,ZESTRIL) 2.5 MG tablet Take 1 tablet (2.5 mg total) by mouth daily. 30 tablet 11  . Methylcellulose, Laxative, (CITRUCEL) 500 MG TABS Take 2 tablets by mouth daily.    . Multiple Vitamin (MULTIVITAMIN) tablet Take 1 tablet by mouth daily.    . nitroGLYCERIN (NITROSTAT) 0.4 MG SL tablet Place 1 tablet (0.4 mg total) under the tongue every 5 (five) minutes as needed for chest pain. 25 tablet 3  . oxyCODONE-acetaminophen (PERCOCET/ROXICET) 5-325 MG tablet Take 1-2 tablets by mouth every 6 (six) hours as needed for moderate pain or severe pain.     . pantoprazole (PROTONIX) 40 MG tablet Take 40 mg by mouth 2 (two) times daily.     Marland Kitchen triamcinolone ointment (KENALOG) 0.1 % Apply  1 application topically daily as needed (for rash/itching).     . vitamin C (ASCORBIC ACID) 500 MG tablet Take 2,000 mg by mouth daily.     No current facility-administered medications for this visit.    Allergies  Cephalexin  Electrocardiogram: 9/14  SR nonspecific ST changes  01/02/15  SR rate 72 LAD low voltage nonspecific ST changes persist  06/22/15 SR rate 69 anterolateral T wave changes   Assessment and Plan Chest Pain: resolved no CAD on cath  Takatsubo:  EF back to normal by MRI September 16  Continue beta blocker and  ACE Esoph:   HTN:  Well controlled.  Continue current medications and low sodium Dash type diet.   Chol:  lipitor started f/u labs in 2 months  Lab Results  Component Value Date   LDLCALC 76 03/27/2015  Fatigue:  F/u primary for routine blood work to include thyroid.      Jenkins Rouge

## 2015-08-13 ENCOUNTER — Ambulatory Visit (INDEPENDENT_AMBULATORY_CARE_PROVIDER_SITE_OTHER): Payer: BLUE CROSS/BLUE SHIELD | Admitting: Cardiovascular Disease

## 2015-08-13 ENCOUNTER — Encounter: Payer: Self-pay | Admitting: Cardiovascular Disease

## 2015-08-13 VITALS — BP 140/80 | HR 65 | Ht 64.0 in | Wt 190.2 lb

## 2015-08-13 DIAGNOSIS — Z79899 Other long term (current) drug therapy: Secondary | ICD-10-CM | POA: Diagnosis not present

## 2015-08-13 DIAGNOSIS — I10 Essential (primary) hypertension: Secondary | ICD-10-CM

## 2015-08-13 NOTE — Patient Instructions (Signed)

## 2015-08-19 ENCOUNTER — Ambulatory Visit (INDEPENDENT_AMBULATORY_CARE_PROVIDER_SITE_OTHER): Payer: BLUE CROSS/BLUE SHIELD | Admitting: Nurse Practitioner

## 2015-08-19 ENCOUNTER — Ambulatory Visit (INDEPENDENT_AMBULATORY_CARE_PROVIDER_SITE_OTHER): Payer: BLUE CROSS/BLUE SHIELD

## 2015-08-19 ENCOUNTER — Encounter: Payer: Self-pay | Admitting: Nurse Practitioner

## 2015-08-19 VITALS — BP 138/78 | HR 88 | Temp 97.8°F | Ht 64.0 in | Wt 180.0 lb

## 2015-08-19 DIAGNOSIS — S62605A Fracture of unspecified phalanx of left ring finger, initial encounter for closed fracture: Secondary | ICD-10-CM

## 2015-08-19 DIAGNOSIS — S6992XA Unspecified injury of left wrist, hand and finger(s), initial encounter: Secondary | ICD-10-CM

## 2015-08-19 MED ORDER — OXYCODONE-ACETAMINOPHEN 5-325 MG PO TABS: 1.0000 | ORAL_TABLET | Freq: Four times a day (QID) | ORAL | Status: DC | PRN

## 2015-08-19 NOTE — Progress Notes (Signed)
   Subjective:    Patient ID: [name], female    DOB: [birth year], 55 y.o.   MRN: [id number]  HPI Patient in today with injury to left ring finger- she got it in dog leash and pulled her finger back. Swollen and sore.    Review of Systems  Constitutional: Negative.   Respiratory: Negative.   Cardiovascular: Negative.   Genitourinary: Negative.   Neurological: Negative.   Psychiatric/Behavioral: Negative.   All other systems reviewed and are negative.      Objective:   Physical Exam  Constitutional: She appears well-developed and well-nourished. No distress.  Cardiovascular: Normal rate, regular rhythm and normal heart sounds.   Pulmonary/Chest: Effort normal and breath sounds normal.  Musculoskeletal:  Left ring finger edematous at base with pain on flexion and extension.  Skin: Skin is warm.  Psychiatric: She has a normal mood and affect. Her behavior is normal. Judgment and thought content normal.   LMP 05/18/2011  BP 138/78 mmHg  Pulse 88  Temp(Src) 97.8 F (36.6 C) (Oral)  Ht 5\' 4"  (1.626 m)  Wt 180 lb (81.647 kg)  BMI 30.88 kg/m2  LMP 05/18/2011   Left finger xray- nondisplaced fracture of proximal phalange left ring finger     Assessment & Plan:   1. Finger injury, left, initial encounter   2. Fracture of phalanx of left ring finger, closed, initial encounter    Wear splint for 2 weeks- RTO in 2 weeks to recheck x ray Out of work X2 weeks  Mary-Margaret Hassell Done, FNP

## 2015-08-19 NOTE — Patient Instructions (Signed)
Finger Fracture  Fractures of fingers are breaks in the bones of the fingers. There are many types of fractures. There are different ways of treating these fractures. Your health care provider will discuss the best way to treat your fracture.  CAUSES  Traumatic injury is the main cause of broken fingers. These include:  · Injuries while playing sports.  · Workplace injuries.  · Falls.  RISK FACTORS  Activities that can increase your risk of finger fractures include:  · Sports.  · Workplace activities that involve machinery.  · A condition called osteoporosis, which can make your bones less dense and cause them to fracture more easily.  SIGNS AND SYMPTOMS  The main symptoms of a broken finger are pain and swelling within 15 minutes after the injury. Other symptoms include:  · Bruising of your finger.  · Stiffness of your finger.  · Numbness of your finger.  · Exposed bones (compound fracture) if the fracture is severe.  DIAGNOSIS   The best way to diagnose a broken bone is with X-ray imaging. Additionally, your health care provider will use this X-ray image to evaluate the position of the broken finger bones.   TREATMENT   Finger fractures can be treated with:   · Nonreduction--This means the bones are in place. The finger is splinted without changing the positions of the bone pieces. The splint is usually left on for about a week to 10 days. This will depend on your fracture and what your health care provider thinks.  · Closed reduction--The bones are put back into position without using surgery. The finger is then splinted.  · Open reduction and internal fixation--The fracture site is opened. Then the bone pieces are fixed into place with pins or some type of hardware. This is seldom required. It depends on the severity of the fracture.  HOME CARE INSTRUCTIONS   · Follow your health care provider's instructions regarding activities, exercises, and physical therapy.  · Only take over-the-counter or prescription  medicines for pain, discomfort, or fever as directed by your health care provider.  SEEK MEDICAL CARE IF:  You have pain or swelling that limits the motion or use of your fingers.  SEEK IMMEDIATE MEDICAL CARE IF:   Your finger becomes numb.  MAKE SURE YOU:   · Understand these instructions.  · Will watch your condition.  · Will get help right away if you are not doing well or get worse.     This information is not intended to replace advice given to you by your health care provider. Make sure you discuss any questions you have with your health care provider.     Document Released: 09/17/2000 Document Revised: 03/26/2013 Document Reviewed: 01/15/2013  Elsevier Interactive Patient Education ©2016 [employer]

## 2015-08-27 DIAGNOSIS — Z0289 Encounter for other administrative examinations: Secondary | ICD-10-CM

## 2015-09-02 ENCOUNTER — Encounter: Payer: Self-pay | Admitting: Nurse Practitioner

## 2015-09-02 ENCOUNTER — Ambulatory Visit (INDEPENDENT_AMBULATORY_CARE_PROVIDER_SITE_OTHER): Payer: BLUE CROSS/BLUE SHIELD

## 2015-09-02 ENCOUNTER — Ambulatory Visit (INDEPENDENT_AMBULATORY_CARE_PROVIDER_SITE_OTHER): Payer: BLUE CROSS/BLUE SHIELD | Admitting: Nurse Practitioner

## 2015-09-02 VITALS — Ht 64.0 in | Wt 178.0 lb

## 2015-09-02 DIAGNOSIS — S6992XA Unspecified injury of left wrist, hand and finger(s), initial encounter: Secondary | ICD-10-CM

## 2015-09-02 NOTE — Progress Notes (Signed)
   Subjective:    Patient ID: [name], female    DOB: 10-06-1960, 55 y.o.   MRN: [id number]  HPI  Patient came in on 08/19/15 with injury to left ring finger. Had closed nondisplaced fracture of proximal phalanx- was put in alumnafoam splint-- returns today to repeat xray to make sure healing properly.    Review of Systems  Constitutional: Negative.   HENT: Negative.   Respiratory: Negative.   Cardiovascular: Negative.   Genitourinary: Negative.   Neurological: Negative.   Psychiatric/Behavioral: Negative.   All other systems reviewed and are negative.      Objective:   Physical Exam  Constitutional: [name] is oriented to person, place, and time. [name] appears well-developed and well-nourished.  Cardiovascular: Normal rate and normal heart sounds.   Pulmonary/Chest: Effort normal.  Musculoskeletal:  alumnafoam splint removed which reveals slight contusion of ring finger at base.  Neurological: [name] is alert and oriented to person, place, and time.  Skin: Skin is warm.  Psychiatric: [name] has a normal mood and affect. Her behavior is normal. Judgment and thought content normal.   Ht 5\' 4"  (1.626 m)  Wt 178 lb (80.74 kg)  BMI 30.54 kg/m2  LMP 05/18/2011    Left ring finger- healing  Nondisplaced phalanx fracture- Preliminary reading by Ronnald Collum, FNP  [facility]      Assessment & Plan:  1. Finger injury, left, initial encounter Needs to wear splint 2 more weeks at least May take splint off at night and shower but must put back on. - DG Hand Complete Left; Future  Mary-Margaret Hassell Done, FNP

## 2015-09-06 ENCOUNTER — Telehealth: Payer: Self-pay | Admitting: Family Medicine

## 2015-09-06 NOTE — Telephone Encounter (Signed)
Patient aware.

## 2015-09-16 ENCOUNTER — Encounter: Payer: Self-pay | Admitting: Nurse Practitioner

## 2015-09-16 ENCOUNTER — Ambulatory Visit (INDEPENDENT_AMBULATORY_CARE_PROVIDER_SITE_OTHER): Payer: BLUE CROSS/BLUE SHIELD | Admitting: Nurse Practitioner

## 2015-09-16 ENCOUNTER — Ambulatory Visit (INDEPENDENT_AMBULATORY_CARE_PROVIDER_SITE_OTHER): Payer: BLUE CROSS/BLUE SHIELD

## 2015-09-16 VITALS — BP 128/79 | HR 70 | Temp 97.4°F | Ht 64.0 in | Wt 192.0 lb

## 2015-09-16 DIAGNOSIS — S62605A Fracture of unspecified phalanx of left ring finger, initial encounter for closed fracture: Secondary | ICD-10-CM

## 2015-09-16 NOTE — Progress Notes (Signed)
   Subjective:    Patient ID: [name], female    DOB: February 10, 1961, 55 y.o.   MRN: [id number]  HPI Patient comes in today for recheck of finger fracture. She was seen on 08/19/15 after getting finger caught in dog leash- the fracture was nondisplaced and she was put in splint- she came back to recheck on 09/02/15 for repeat xray which showed very little bone growth but no movement- she is back today to recheck.c    Review of Systems  All other systems reviewed and are negative.      Objective:   Physical Exam  Musculoskeletal:  still has slight swelling of left ring finger    Xray - healing nondisplaced fracture proximal phalanx left ring foinger-Preliminary reading by Tammy Collum, FNP  [facility]       Assessment & Plan:   1. Fracture of phalanx of left ring finger, closed, initial encounter    Buddy wrap with coban to fifth finger Motrin or tylenol OTC as needed for pain May return to work without restrictions on 09/27/15  [place], FNP

## 2015-10-05 ENCOUNTER — Ambulatory Visit (INDEPENDENT_AMBULATORY_CARE_PROVIDER_SITE_OTHER): Payer: BLUE CROSS/BLUE SHIELD | Admitting: Family

## 2015-10-05 ENCOUNTER — Encounter: Payer: Self-pay | Admitting: Family

## 2015-10-05 VITALS — BP 138/88 | HR 70 | Temp 97.8°F | Ht 64.0 in | Wt 194.4 lb

## 2015-10-05 DIAGNOSIS — R350 Frequency of micturition: Secondary | ICD-10-CM

## 2015-10-05 DIAGNOSIS — N3001 Acute cystitis with hematuria: Secondary | ICD-10-CM

## 2015-10-05 LAB — URINALYSIS, COMPLETE
BILIRUBIN UA: NEGATIVE
Glucose, UA: NEGATIVE
KETONES UA: NEGATIVE
NITRITE UA: NEGATIVE
PH UA: 6.5 (ref 5.0–7.5)
Protein, UA: NEGATIVE
SPEC GRAV UA: 1.015 (ref 1.005–1.030)
Urobilinogen, Ur: 0.2 mg/dL (ref 0.2–1.0)

## 2015-10-05 LAB — MICROSCOPIC EXAMINATION: Bacteria, UA: NONE SEEN

## 2015-10-05 MED ORDER — SULFAMETHOXAZOLE-TRIMETHOPRIM 800-160 MG PO TABS: 1.0000 | ORAL_TABLET | Freq: Two times a day (BID) | ORAL | Status: DC

## 2015-10-05 NOTE — Progress Notes (Signed)
   Subjective:    Patient ID: [name], female    DOB: 07-13-1960, 55 y.o.   MRN: [id number]  Dysuria  This is a new problem. The current episode started in the past 7 days. The problem occurs every urination. The problem has been waxing and waning. The quality of the pain is described as burning. The pain is at a severity of 10/10. The pain is mild. There has been no fever. Associated symptoms include flank pain, frequency, hesitancy and urgency. Pertinent negatives include no hematuria, nausea or vomiting. She has tried increased fluids for the symptoms. The treatment provided mild relief.      Review of Systems  Respiratory: Negative for shortness of breath.   Cardiovascular: Negative for palpitations.  Gastrointestinal: Negative for nausea and vomiting.  Genitourinary: Positive for dysuria, hesitancy, urgency, frequency and flank pain. Negative for hematuria.  Neurological: Negative.  Negative for headaches.  All other systems reviewed and are negative.      Objective:   Physical Exam  Constitutional: She is oriented to person, place, and time. She appears well-developed and well-nourished. No distress.  HENT:  Head: Normocephalic and atraumatic.  Eyes: Pupils are equal, round, and reactive to light.  Neck: Normal range of motion. Neck supple. No thyromegaly present.  Cardiovascular: Normal rate, regular rhythm, normal heart sounds and intact distal pulses.   No murmur heard. Pulmonary/Chest: Effort normal and breath sounds normal. No respiratory distress. She has no wheezes.  Abdominal: Soft. Bowel sounds are normal. She exhibits no distension. There is no tenderness.  Musculoskeletal: Normal range of motion. She exhibits no edema or tenderness.  Negative for CVA tenderness   Neurological: She is alert and oriented to person, place, and time.  Skin: Skin is warm and dry.  Psychiatric: She has a normal mood and affect. Her behavior is normal. Judgment and thought content  normal.  Vitals reviewed.     BP 138/88 mmHg  Pulse 70  Temp(Src) 97.8 F (36.6 C) (Oral)  Ht 5\' 4"  (1.626 m)  Wt 194 lb 6.4 oz (88.179 kg)  BMI 33.35 kg/m2  LMP 05/18/2011     Assessment & Plan:  1. Frequency of urination - Urinalysis, Complete  2. Acute cystitis with hematuria -Force fluids AZO over the counter X2 days RTO prn Culture pending - sulfamethoxazole-trimethoprim (BACTRIM DS) 800-160 MG tablet; Take 1 tablet by mouth 2 (two) times daily.  Dispense: 14 tablet; Refill: 0 - Urine culture  Evelina Dun, FNP

## 2015-10-05 NOTE — Patient Instructions (Signed)

## 2015-10-07 LAB — URINE CULTURE

## 2015-10-11 ENCOUNTER — Telehealth: Payer: Self-pay | Admitting: Family

## 2015-10-11 MED ORDER — CIPROFLOXACIN HCL 500 MG PO TABS
500.0000 mg | ORAL_TABLET | Freq: Two times a day (BID) | ORAL | Status: DC
Start: 2015-10-11 — End: 2015-12-26

## 2015-10-11 NOTE — Telephone Encounter (Signed)
Pt aware.

## 2015-10-11 NOTE — Telephone Encounter (Signed)
Cipro Prescription sent to pharmacy, culture grew two organisms so could not give [place] list of antibiotics

## 2015-10-15 DIAGNOSIS — M546 Pain in thoracic spine: Secondary | ICD-10-CM | POA: Insufficient documentation

## 2015-12-12 IMAGING — US US ABDOMEN LIMITED
1 series · 14 of 25 positions shown · non-contrast
Comparison: None

CLINICAL DATA: Precordial pain, dysphagia

EXAM:
US ABDOMEN LIMITED - RIGHT UPPER QUADRANT

[Series 1: us abdomen limited · 0.17mm/px · 14 of 70 slices shown]
[im 1/70]
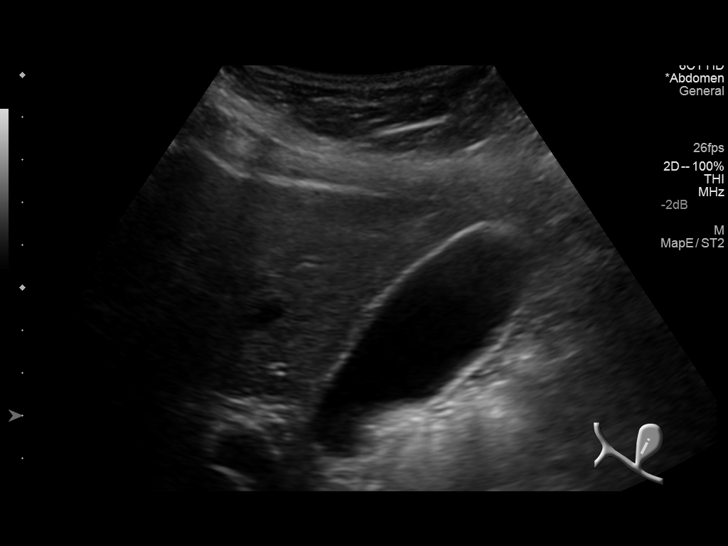
[im 6/70]
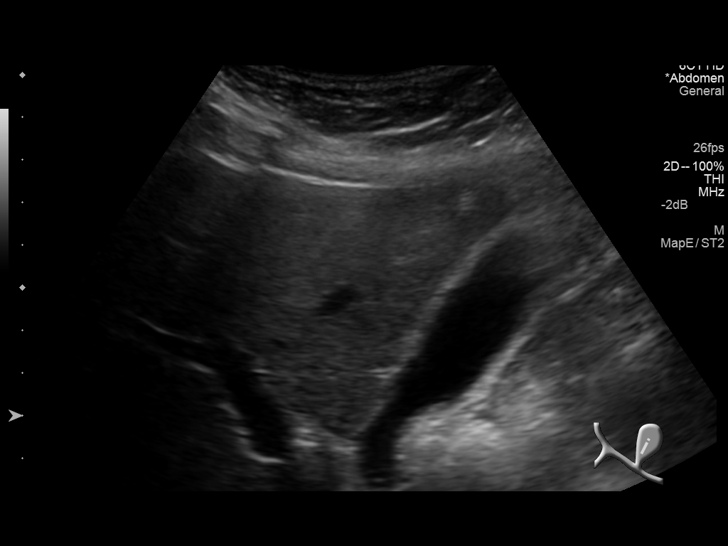
[im 12/70]
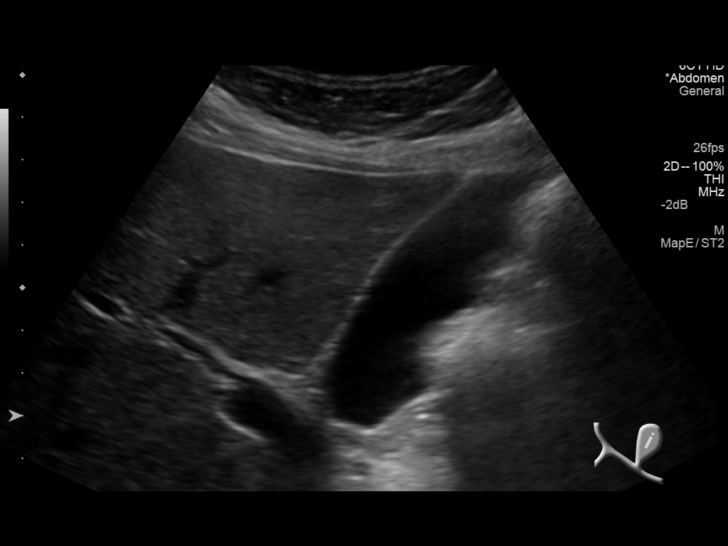
[im 18/70]
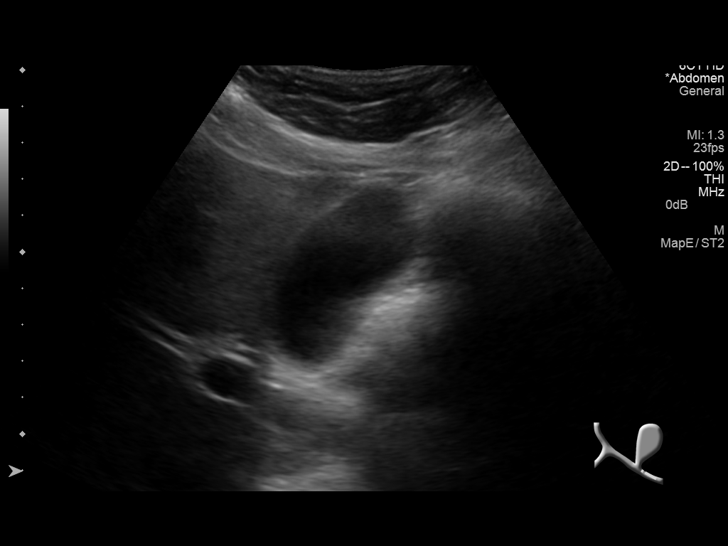
[im 24/70]
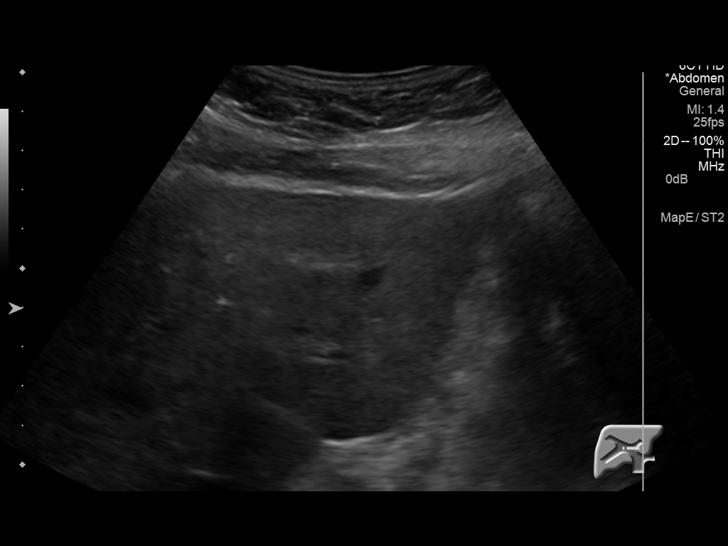
[im 26/70]
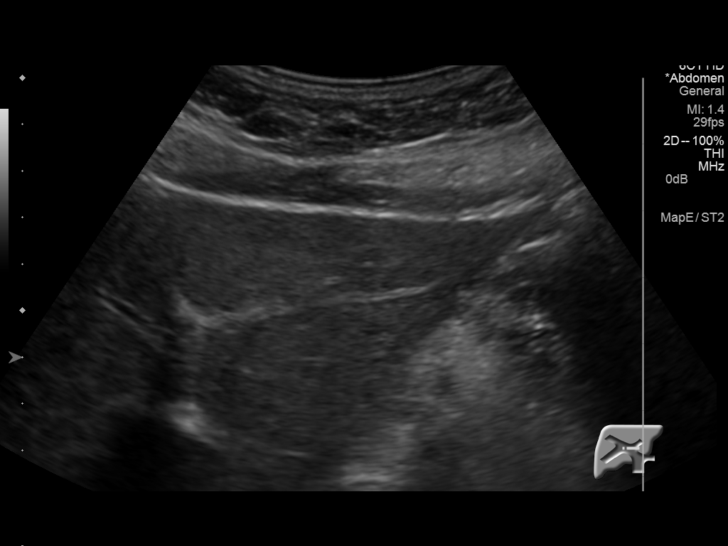
[im 32/70]
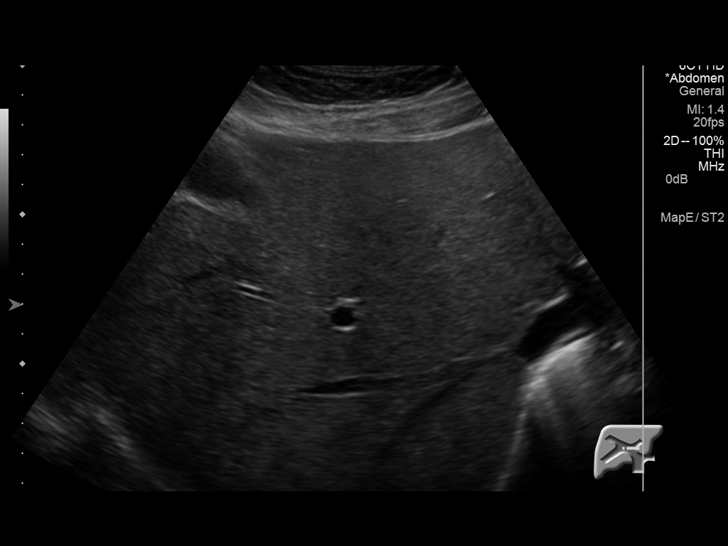
[im 38/70]
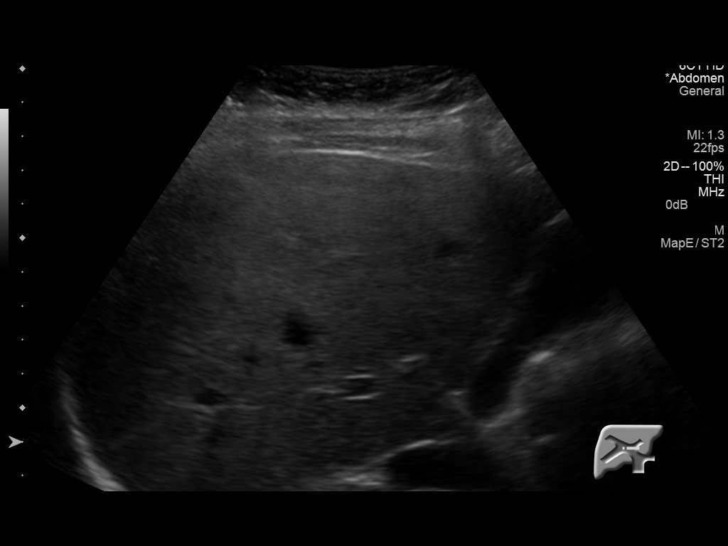
[im 44/70]
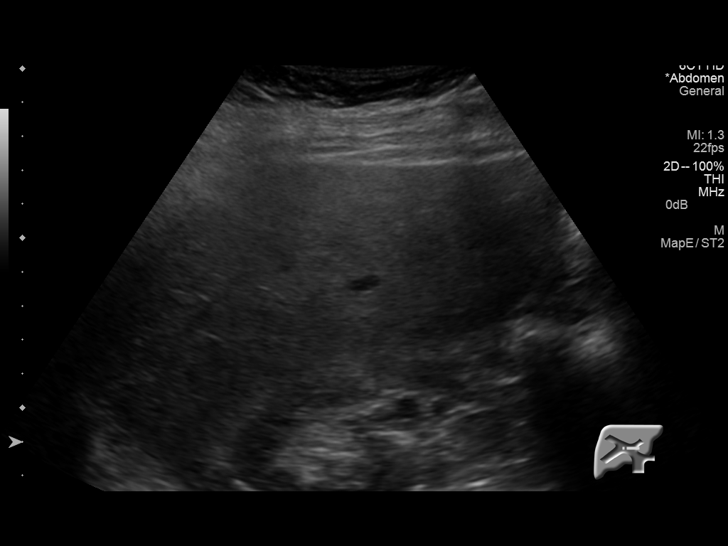
[im 47/70]
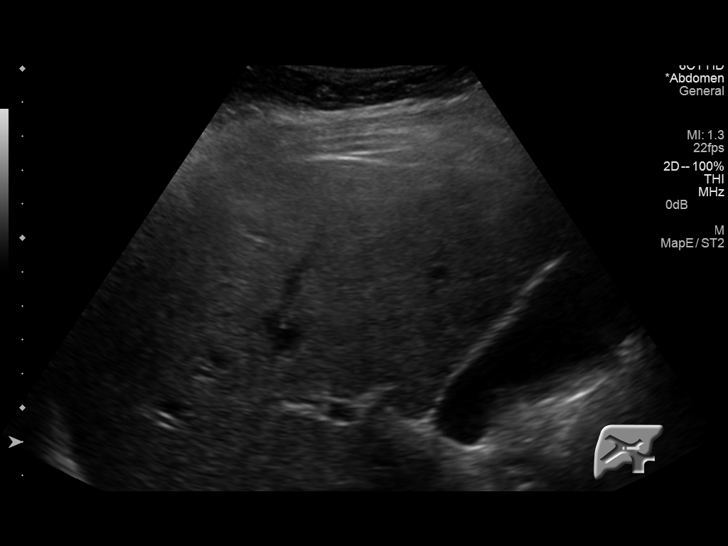
[im 52/70]
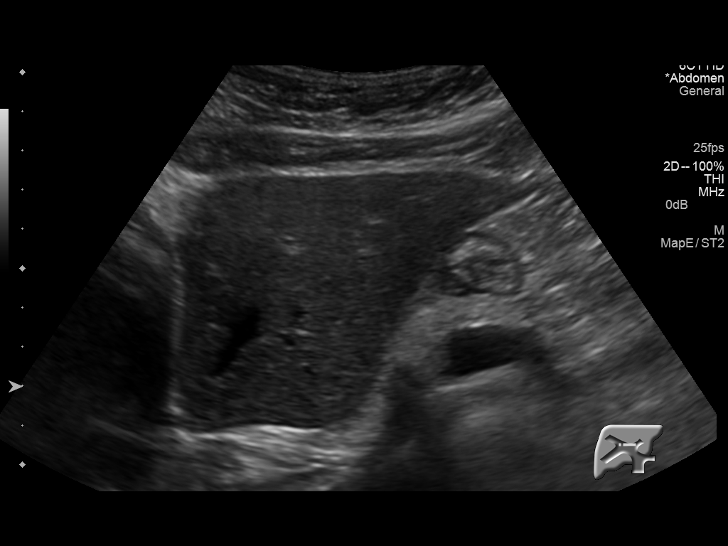
[im 58/70]
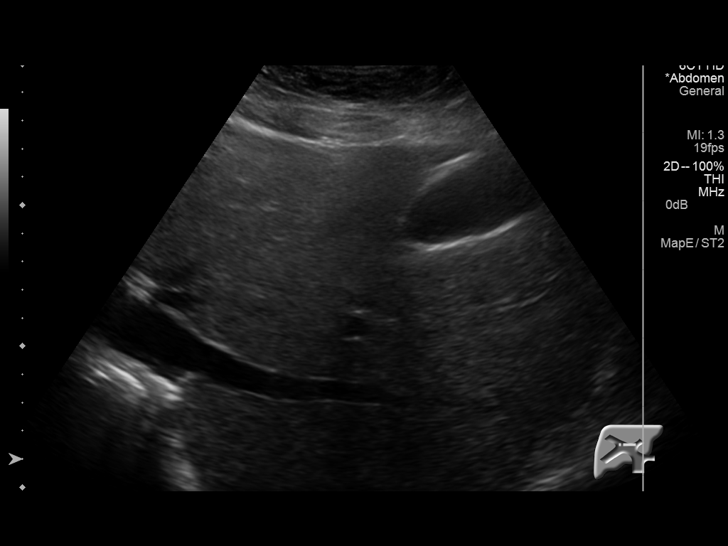
[im 64/70]
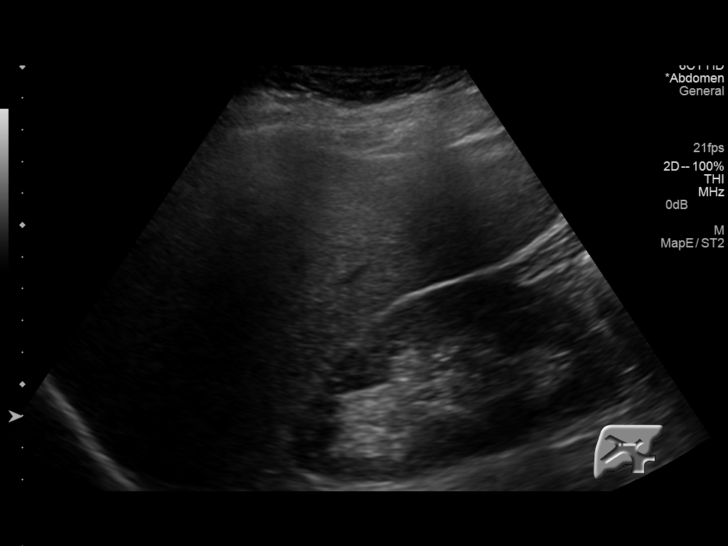
[im 70/70]
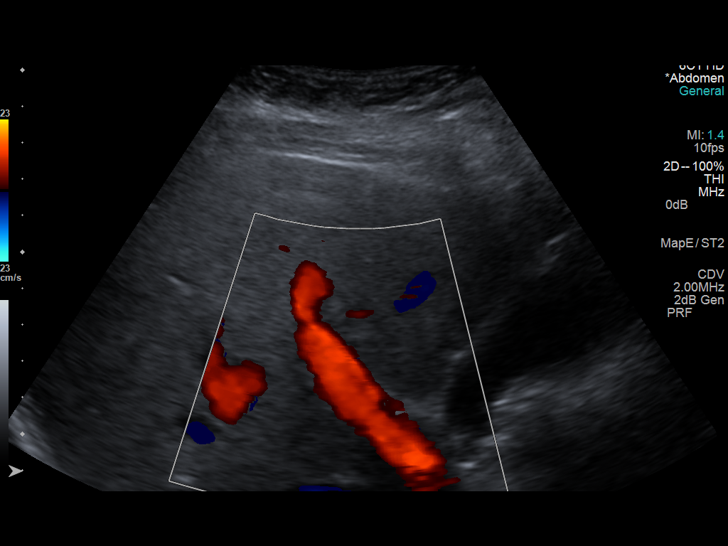

[14 of 25 positions shown; findings below may reference images not displayed]

FINDINGS: Gallbladder:

Normally distended without stones or wall thickening.

No pericholecystic fluid or sonographic Murphy sign.

Common bile duct:

Diameter: Normal caliber 4 mm diameter

Liver:

Normal appearance

No RIGHT upper quadrant free fluid.
IMPRESSION: Normal exam.

## 2015-12-12 IMAGING — CR DG UGI W/ HIGH DENSITY W/KUB
3 series · 3 of 3 positions shown · non-contrast
Comparison: Abdominal radiograph 03/12/2013

CLINICAL DATA: Dysphagia with solids for 1 month, epigastric pain,
history of melanoma

EXAM:
UPPER GI SERIES WITH KUB
TECHNIQUE: After obtaining a scout radiograph a routine upper GI series was
performed using thin and high density barium. Patient also swallowed
a 12.5 mm diameter barium tablet.

[view not recorded (1 of 3)]
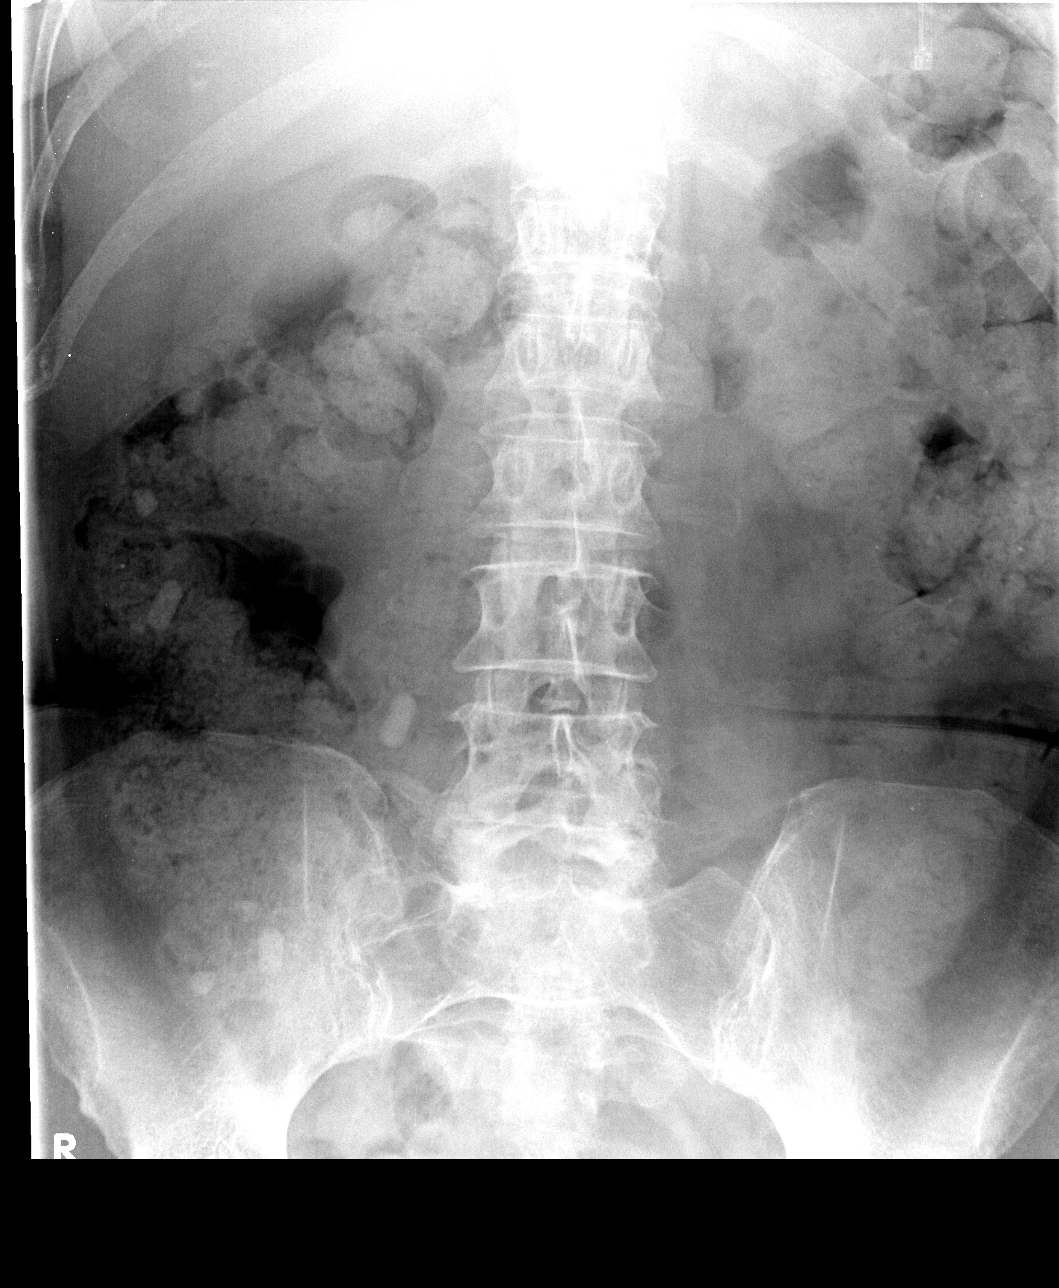

[view not recorded (2 of 3)]
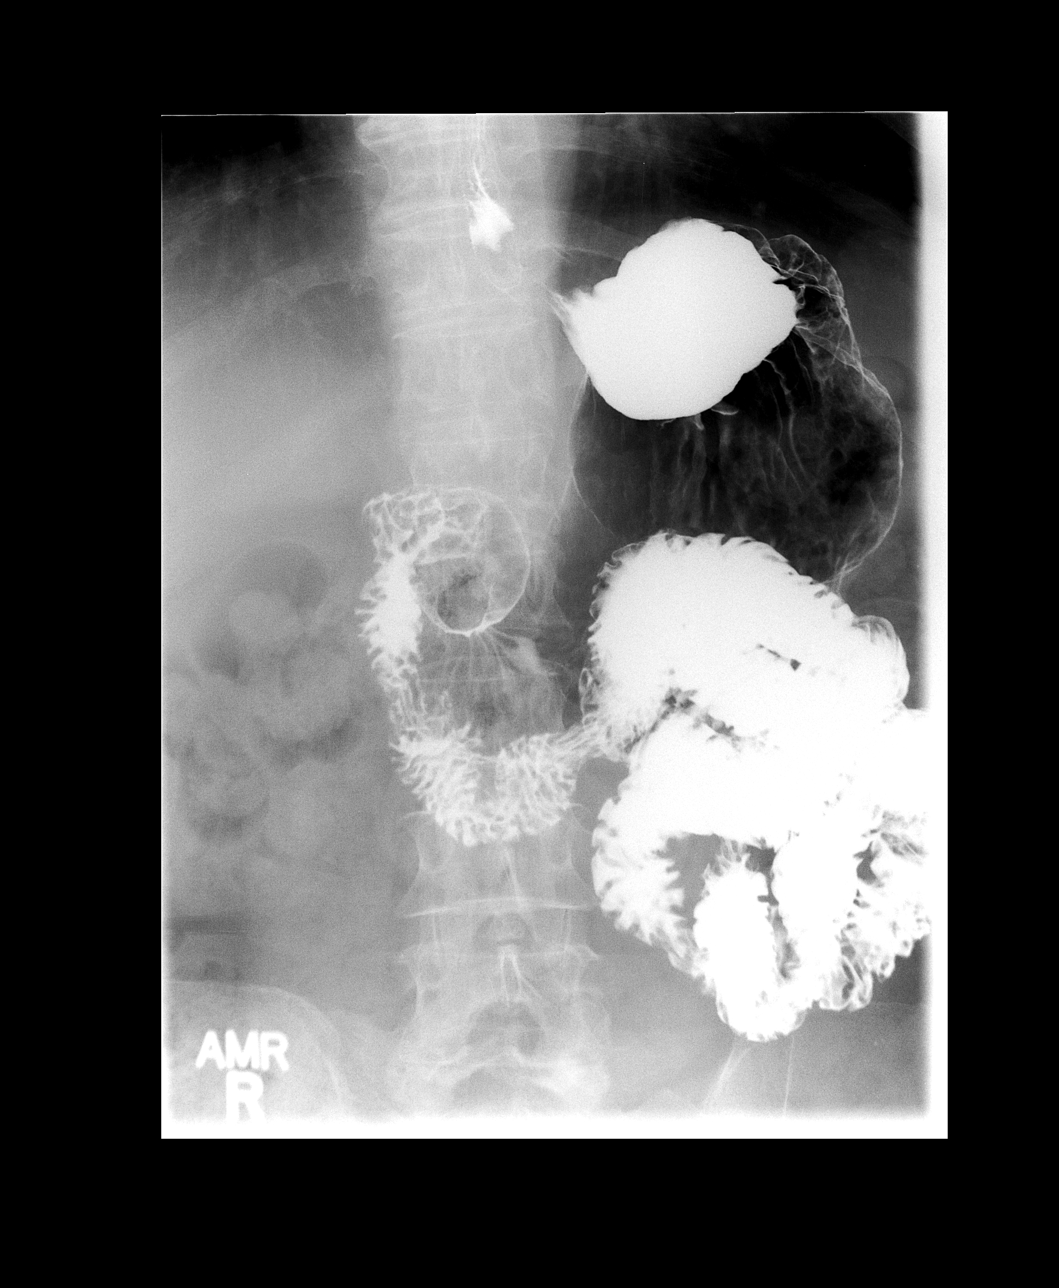

[view not recorded (3 of 3)]
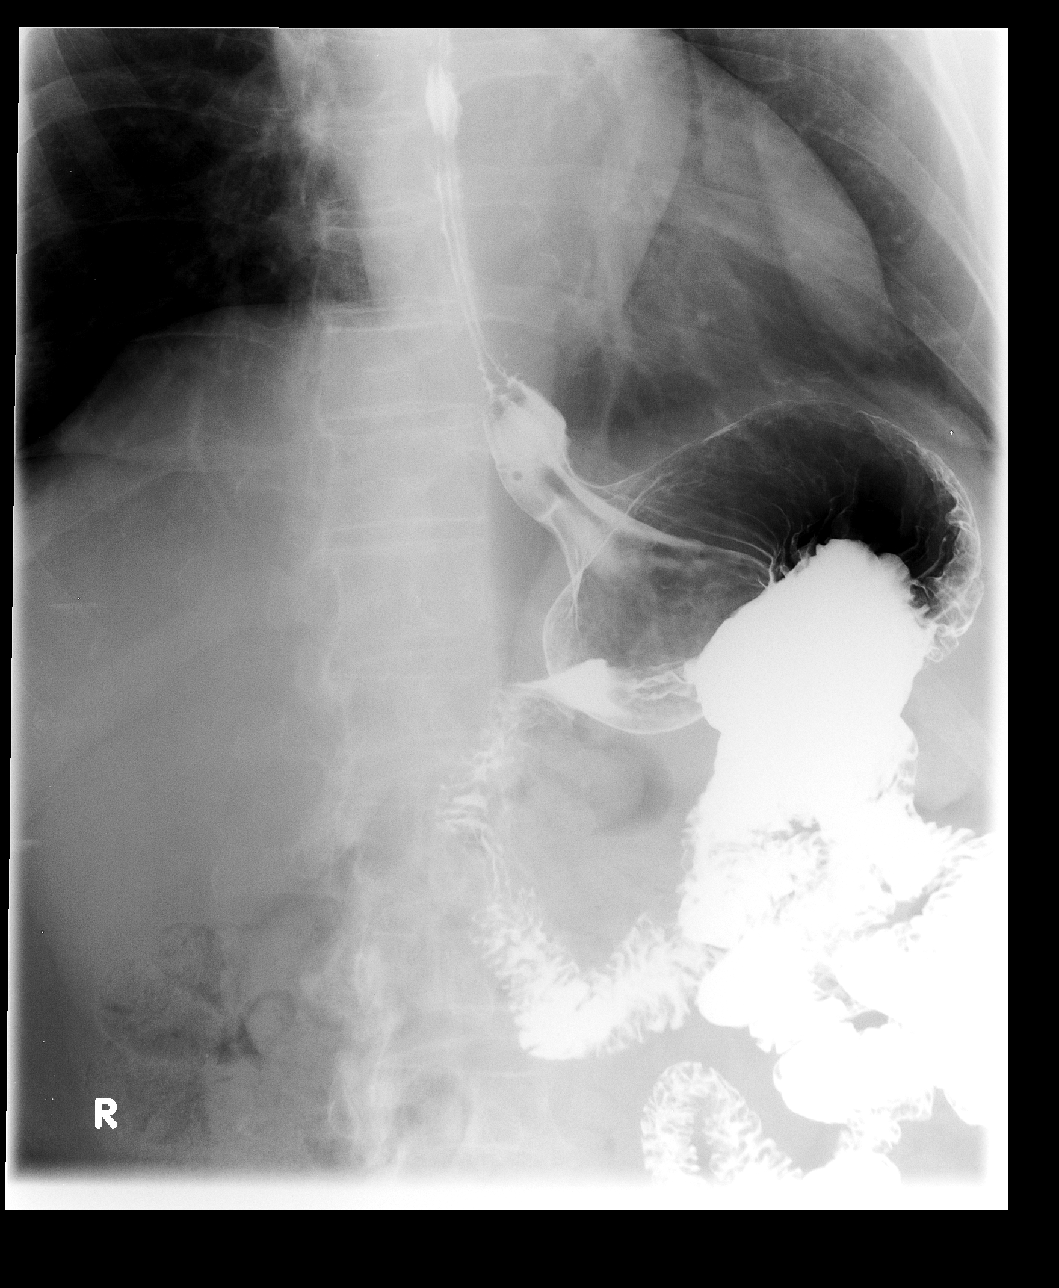

[3 of 3 positions shown; findings below may reference images not displayed]

FLUOROSCOPY TIME:  Radiation Exposure Index (as provided by the
fluoroscopic device): Not provided

If the device does not provide the exposure index:

Fluoroscopy Time (in minutes and seconds):  2 minutes 18 seconds

Number of Acquired Images: 15 plus multiple screen captures obtained
during fluoroscopy
FINDINGS: Increased stool throughout colon.

Multiple medication tablets project over proximal half of colon.

Additional tiny radiopacities within stool of descending colon.

Small bowel gas pattern normal.

Bones demineralized.

Normal esophageal distention and motility.

No esophageal mass or stricture.

12.5 mm diameter barium tablet passed from oral cavity to stomach
without obstruction.

Targeted rapid sequence imaging of cervical esophagus and
hypopharynx showed no laryngeal penetration or aspiration.

Stomach distends normally with normal rugal fold pattern.

No gastric mass, ulceration, outlet obstruction or fold thickening.

No hiatal hernia or gastroesophageal reflux seen during exam.

Duodenal bulb and sweep normal appearance.

Ligament of Treitz normal position.

Jejunal loops unremarkable.
IMPRESSION: Normal upper GI exam.

Increased stool throughout colon on scout image.

## 2015-12-24 ENCOUNTER — Other Ambulatory Visit: Payer: Self-pay | Admitting: Physician Assistant

## 2015-12-26 ENCOUNTER — Emergency Department (HOSPITAL_COMMUNITY): Payer: BLUE CROSS/BLUE SHIELD

## 2015-12-26 ENCOUNTER — Encounter (HOSPITAL_COMMUNITY): Payer: Self-pay | Admitting: *Deleted

## 2015-12-26 ENCOUNTER — Observation Stay (HOSPITAL_COMMUNITY)
Admission: EM | Admit: 2015-12-26 | Discharge: 2015-12-28 | Disposition: A | Discharge status: Home / Self Care | Payer: BLUE CROSS/BLUE SHIELD | Attending: Internal Medicine | Admitting: Internal Medicine

## 2015-12-26 DIAGNOSIS — R109 Unspecified abdominal pain: Secondary | ICD-10-CM | POA: Diagnosis present

## 2015-12-26 DIAGNOSIS — J9690 Respiratory failure, unspecified, unspecified whether with hypoxia or hypercapnia: Secondary | ICD-10-CM | POA: Diagnosis not present

## 2015-12-26 DIAGNOSIS — I252 Old myocardial infarction: Secondary | ICD-10-CM | POA: Insufficient documentation

## 2015-12-26 DIAGNOSIS — Z79899 Other long term (current) drug therapy: Secondary | ICD-10-CM | POA: Diagnosis not present

## 2015-12-26 DIAGNOSIS — I10 Essential (primary) hypertension: Secondary | ICD-10-CM | POA: Diagnosis present

## 2015-12-26 DIAGNOSIS — R42 Dizziness and giddiness: Secondary | ICD-10-CM | POA: Diagnosis not present

## 2015-12-26 DIAGNOSIS — R110 Nausea: Secondary | ICD-10-CM | POA: Diagnosis not present

## 2015-12-26 DIAGNOSIS — R1012 Left upper quadrant pain: Secondary | ICD-10-CM | POA: Insufficient documentation

## 2015-12-26 DIAGNOSIS — I214 Non-ST elevation (NSTEMI) myocardial infarction: Secondary | ICD-10-CM | POA: Diagnosis present

## 2015-12-26 DIAGNOSIS — F329 Major depressive disorder, single episode, unspecified: Secondary | ICD-10-CM | POA: Diagnosis not present

## 2015-12-26 DIAGNOSIS — K5900 Constipation, unspecified: Secondary | ICD-10-CM | POA: Diagnosis not present

## 2015-12-26 DIAGNOSIS — R52 Pain, unspecified: Secondary | ICD-10-CM

## 2015-12-26 DIAGNOSIS — R0602 Shortness of breath: Secondary | ICD-10-CM

## 2015-12-26 DIAGNOSIS — E785 Hyperlipidemia, unspecified: Secondary | ICD-10-CM | POA: Diagnosis present

## 2015-12-26 DIAGNOSIS — K219 Gastro-esophageal reflux disease without esophagitis: Secondary | ICD-10-CM | POA: Diagnosis present

## 2015-12-26 DIAGNOSIS — Z7982 Long term (current) use of aspirin: Secondary | ICD-10-CM | POA: Diagnosis not present

## 2015-12-26 DIAGNOSIS — J9601 Acute respiratory failure with hypoxia: Secondary | ICD-10-CM | POA: Diagnosis not present

## 2015-12-26 DIAGNOSIS — J9602 Acute respiratory failure with hypercapnia: Principal | ICD-10-CM | POA: Insufficient documentation

## 2015-12-26 LAB — CBC
HCT: 35.8 % — ABNORMAL LOW (ref 36.0–46.0)
Hemoglobin: 11.9 g/dL — ABNORMAL LOW (ref 12.0–15.0)
MCH: 30.7 pg (ref 26.0–34.0)
MCHC: 33.2 g/dL (ref 30.0–36.0)
MCV: 92.3 fL (ref 78.0–100.0)
Platelets: 131 10*3/uL — ABNORMAL LOW (ref 150–400)
RBC: 3.88 MIL/uL (ref 3.87–5.11)
RDW: 12.6 % (ref 11.5–15.5)
WBC: 4.2 10*3/uL (ref 4.0–10.5)

## 2015-12-26 LAB — COMPREHENSIVE METABOLIC PANEL
ALBUMIN: 4.3 g/dL (ref 3.5–5.0)
ALK PHOS: 66 U/L (ref 38–126)
ALT: 23 U/L (ref 14–54)
AST: 27 U/L (ref 15–41)
Anion gap: 7 (ref 5–15)
BILIRUBIN TOTAL: 0.6 mg/dL (ref 0.3–1.2)
BUN: 23 mg/dL — AB (ref 6–20)
CALCIUM: 8.7 mg/dL — AB (ref 8.9–10.3)
CO2: 28 mmol/L (ref 22–32)
CREATININE: 0.85 mg/dL (ref 0.44–1.00)
Chloride: 103 mmol/L (ref 101–111)
GFR calc Af Amer: >60 mL/min (ref >60)
GFR calc non Af Amer: >60 mL/min (ref >60)
GLUCOSE: 99 mg/dL (ref 65–99)
Potassium: 4.2 mmol/L (ref 3.5–5.1)
Sodium: 138 mmol/L (ref 135–145)
TOTAL PROTEIN: 7.2 g/dL (ref 6.5–8.1)

## 2015-12-26 LAB — BLOOD GAS, ARTERIAL
ACID-BASE EXCESS: 3.3 mmol/L — AB (ref 0.0–2.0)
Acid-Base Excess: 3.5 mmol/L — ABNORMAL HIGH (ref 0.0–2.0)
BICARBONATE: 25.9 meq/L — AB (ref 20.0–24.0)
Bicarbonate: 26.5 mEq/L — ABNORMAL HIGH (ref 20.0–24.0)
Drawn by: 22223
FIO2: 21
O2 CONTENT: 1.5 L/min
O2 Saturation: 91.7 %
O2 Saturation: 96.8 %
PH ART: 7.285 — AB (ref 7.350–7.450)
PH ART: 7.331 — AB (ref 7.350–7.450)
PO2 ART: 67.1 mmHg — AB (ref 80.0–100.0)
TCO2: 15.6 mmol/L (ref 0–100)
pCO2 arterial: 56.4 mmHg — ABNORMAL HIGH (ref 35.0–45.0)
pCO2 arterial: 64 mmHg (ref 35.0–45.0)
pO2, Arterial: 102 mmHg — ABNORMAL HIGH (ref 80.0–100.0)

## 2015-12-26 LAB — URINALYSIS, ROUTINE W REFLEX MICROSCOPIC
BILIRUBIN URINE: NEGATIVE
Glucose, UA: NEGATIVE mg/dL
KETONES UR: NEGATIVE mg/dL
Leukocytes, UA: NEGATIVE
NITRITE: NEGATIVE
Protein, ur: NEGATIVE mg/dL
Specific Gravity, Urine: 1.02 (ref 1.005–1.030)
pH: 7 (ref 5.0–8.0)

## 2015-12-26 LAB — URINE MICROSCOPIC-ADD ON

## 2015-12-26 LAB — D-DIMER, QUANTITATIVE (NOT AT ARMC): D DIMER QUANT: 0.53 ug{FEU}/mL — AB (ref 0.00–0.50)

## 2015-12-26 LAB — TROPONIN I: Troponin I: <0.03 ng/mL (ref <0.03)

## 2015-12-26 LAB — LIPASE, BLOOD: Lipase: 20 U/L (ref 11–51)

## 2015-12-26 LAB — AMYLASE: Amylase: 37 U/L (ref 28–100)

## 2015-12-26 MED ORDER — KETOTIFEN FUMARATE 0.025 % OP SOLN
1.0000 [drp] | Freq: Every day | OPHTHALMIC | Status: DC | PRN
  Filled 2015-12-26: qty 5

## 2015-12-26 MED ORDER — GABAPENTIN 300 MG PO CAPS
300.0000 mg | ORAL_CAPSULE | Freq: Three times a day (TID) | ORAL | Status: DC
  Administered 2015-12-26 – 2015-12-28 (×5): 300 mg via ORAL
  Filled 2015-12-26 (×5): qty 1

## 2015-12-26 MED ORDER — MAGNESIUM OXIDE 400 (241.3 MG) MG PO TABS
400.0000 mg | ORAL_TABLET | Freq: Every day | ORAL | Status: DC
  Administered 2015-12-27 – 2015-12-28 (×2): 400 mg via ORAL
  Filled 2015-12-26 (×2): qty 1

## 2015-12-26 MED ORDER — ONDANSETRON HCL 4 MG/2ML IJ SOLN: 4.0000 mg | Freq: Four times a day (QID) | INTRAMUSCULAR | Status: DC | PRN

## 2015-12-26 MED ORDER — ASPIRIN 81 MG PO CHEW
81.0000 mg | CHEWABLE_TABLET | Freq: Every day | ORAL | Status: DC
  Administered 2015-12-27 – 2015-12-28 (×2): 81 mg via ORAL
  Filled 2015-12-26 (×2): qty 1

## 2015-12-26 MED ORDER — TECHNETIUM TC 99M DIETHYLENETRIAME-PENTAACETIC ACID
30.0000 | Freq: Once | INTRAVENOUS | Status: AC | PRN
  Administered 2015-12-26: 33 via RESPIRATORY_TRACT

## 2015-12-26 MED ORDER — DEXTROSE-NACL 5-0.45 % IV SOLN
INTRAVENOUS | Status: DC
  Administered 2015-12-26 – 2015-12-27 (×2): via INTRAVENOUS

## 2015-12-26 MED ORDER — ONDANSETRON HCL 4 MG PO TABS: 4.0000 mg | ORAL_TABLET | Freq: Four times a day (QID) | ORAL | Status: DC | PRN

## 2015-12-26 MED ORDER — SODIUM CHLORIDE 0.9% FLUSH
3.0000 mL | Freq: Two times a day (BID) | INTRAVENOUS | Status: DC
  Administered 2015-12-28: 3 mL via INTRAVENOUS

## 2015-12-26 MED ORDER — IPRATROPIUM-ALBUTEROL 0.5-2.5 (3) MG/3ML IN SOLN
3.0000 mL | Freq: Once | RESPIRATORY_TRACT | Status: AC
  Administered 2015-12-26: 3 mL via RESPIRATORY_TRACT
  Filled 2015-12-26: qty 3

## 2015-12-26 MED ORDER — LORAZEPAM 2 MG/ML IJ SOLN
1.0000 mg | Freq: Once | INTRAMUSCULAR | Status: AC
  Administered 2015-12-26: 1 mg via INTRAVENOUS
  Filled 2015-12-26: qty 1

## 2015-12-26 MED ORDER — ATORVASTATIN CALCIUM 20 MG PO TABS
20.0000 mg | ORAL_TABLET | Freq: Every day | ORAL | Status: DC
  Administered 2015-12-27 – 2015-12-28 (×2): 20 mg via ORAL
  Filled 2015-12-26 (×2): qty 1

## 2015-12-26 MED ORDER — CARVEDILOL 3.125 MG PO TABS
3.1250 mg | ORAL_TABLET | Freq: Two times a day (BID) | ORAL | Status: DC
  Administered 2015-12-27 – 2015-12-28 (×3): 3.125 mg via ORAL
  Filled 2015-12-26 (×3): qty 1

## 2015-12-26 MED ORDER — ONDANSETRON HCL 4 MG/2ML IJ SOLN
4.0000 mg | Freq: Once | INTRAMUSCULAR | Status: AC
  Administered 2015-12-26: 4 mg via INTRAVENOUS
  Filled 2015-12-26: qty 2

## 2015-12-26 MED ORDER — ENOXAPARIN SODIUM 40 MG/0.4ML ~~LOC~~ SOLN
40.0000 mg | SUBCUTANEOUS | Status: DC
  Administered 2015-12-26 – 2015-12-27 (×2): 40 mg via SUBCUTANEOUS
  Filled 2015-12-26 (×2): qty 0.4

## 2015-12-26 MED ORDER — ALBUTEROL SULFATE (2.5 MG/3ML) 0.083% IN NEBU
5.0000 mg | INHALATION_SOLUTION | Freq: Once | RESPIRATORY_TRACT | Status: AC
  Administered 2015-12-26: 5 mg via RESPIRATORY_TRACT
  Filled 2015-12-26: qty 6

## 2015-12-26 MED ORDER — FENTANYL CITRATE (PF) 100 MCG/2ML IJ SOLN
50.0000 ug | Freq: Once | INTRAMUSCULAR | Status: AC
  Administered 2015-12-26: 50 ug via INTRAVENOUS
  Filled 2015-12-26: qty 2

## 2015-12-26 MED ORDER — MORPHINE SULFATE (PF) 4 MG/ML IV SOLN
4.0000 mg | Freq: Once | INTRAVENOUS | Status: AC
  Administered 2015-12-26: 4 mg via INTRAVENOUS
  Filled 2015-12-26: qty 1

## 2015-12-26 MED ORDER — PANTOPRAZOLE SODIUM 40 MG PO TBEC
40.0000 mg | DELAYED_RELEASE_TABLET | Freq: Two times a day (BID) | ORAL | Status: DC
  Administered 2015-12-26 – 2015-12-28 (×4): 40 mg via ORAL
  Filled 2015-12-26 (×4): qty 1

## 2015-12-26 MED ORDER — METHYLCELLULOSE (LAXATIVE) 500 MG PO TABS
2.0000 | ORAL_TABLET | Freq: Every day | ORAL | Status: DC
  Filled 2015-12-26: qty 2

## 2015-12-26 MED ORDER — TECHNETIUM TO 99M ALBUMIN AGGREGATED
4.0000 | Freq: Once | INTRAVENOUS | Status: AC | PRN
  Administered 2015-12-26: 4 via INTRAVENOUS

## 2015-12-26 MED ORDER — LISINOPRIL 5 MG PO TABS
2.5000 mg | ORAL_TABLET | Freq: Every day | ORAL | Status: DC
  Administered 2015-12-27 – 2015-12-28 (×2): 2.5 mg via ORAL
  Filled 2015-12-26 (×2): qty 1

## 2015-12-26 NOTE — ED Notes (Signed)
Left upper abdominal pain since last night.

## 2015-12-26 NOTE — ED Notes (Signed)
Oxygen sats in the 80's.  Placed on 2 liters  oxygen.

## 2015-12-26 NOTE — ED Notes (Addendum)
Resting with eyes shut.  arouses when call name loudly.  Pt denies any sob .  States her pain is better. Pt continues on monitor with pulse ox staying in the upper 90's on 2 liters oxygen.

## 2015-12-26 NOTE — ED Notes (Signed)
No change in pt status.

## 2015-12-26 NOTE — ED Notes (Signed)
Pt comes in with pain in her LUQ. Pt states she hasn't made a bowel movement in 2 days.

## 2015-12-26 NOTE — ED Notes (Signed)
Physician in to reassess- pt reports continued pain to L upper quad; physician speaks to her of constipation as does her sister. Pt is orthopnic and sedated. Awaiting Bipap

## 2015-12-26 NOTE — ED Notes (Signed)
Pt continues to sleep.  Arouses when call name loudly.  sats still upper 90's on nasal cannula.

## 2015-12-26 NOTE — ED Notes (Signed)
Sister has taken her clothing, glasses and phone-   If Needed :Jerrye Beavers (sister); [phone]

## 2015-12-26 NOTE — ED Notes (Signed)
CRITICAL VALUE ALERT  Critical value received:  CO2   Date of notification:  12/26/2015  Time of notification:  1915  Critical value read back:Yes.    Nurse who received alert:  Fabio Neighbors RN  MD notified (1st page):  Dr. Christy Gentles @ [phone]

## 2015-12-26 NOTE — ED Provider Notes (Signed)
History  By signing my name below, I, Bea Graff, attest that this documentation has been prepared under the direction and in the presence of Ripley Fraise, MD. Electronically Signed: Bea Graff, ED Scribe. 12/26/2015. 12:19 PM.  Chief Complaint  Patient presents with  . Abdominal Pain   Patient is a 55 y.o. female presenting with abdominal pain. The history is provided by the patient and medical records. No language interpreter was used.  Abdominal Pain Pain location:  LUQ Pain quality: stabbing   Pain radiates to:  Back Pain severity:  Severe Onset quality:  Sudden Duration:  1 day Timing:  Constant Progression:  Worsening Chronicity:  New Worsened by:  Nothing tried Associated symptoms: constipation, nausea and shortness of breath   Associated symptoms: no chills, no fever and no vomiting   Nausea:    Severity:  Mild   Onset quality:  Sudden   Duration:  1 day   Progression:  Improving Risk factors: obesity     HPI Comments:  [name] is a 55 y.o. obese female who presents to the Emergency Department complaining of severe LUQ pain that began last night. She reports associated dizziness, nausea and a racing heart. She describes the pain as stabbing and states it radiates into her back. She reports she has not eaten today and has not had an appetite. She reports some constipation and SOB. She states she took a "pain pill" and some Zofran that helped to alleviate the symptoms last night. She denies modifying factors. She denies trauma, injury or fall. She denies fever, chills, vomiting, numbness, tingling or weakness of any extremity. She denies any new medications. She states she has had an MI (Takotsubo cardiomyopathy) one year ago and reports this feels different.   Past Medical History  Diagnosis Date  . PONV (postoperative nausea and vomiting)   . Left ureteral calculus   . History of palpitations     STRESS INDUCED  . Kidney stones   . Kidney cysts      "I think it was right"  . Hypercholesterolemia dx'd 12/2014  . Childhood asthma   . NSTEMI (non-ST elevated myocardial infarction) ([place]) 12/28/2014  . GERD (gastroesophageal reflux disease)     "I don't take RX for it" (12/30/2014)  . Daily headache   . Arthritis     back (12/30/2014)  . Anxiety   . Depression   . 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 Takotsubo syndrome     "Broken heart syndrome"   Past Surgical History  Procedure Laterality Date  . Kidney surgery  1966    BILATERAL URETER'S DILATATION  . Melanoma excision with sentinel lymph node biopsy  2001    moh's procedure/  RIGHT FOREHEAD AND UPPER EYEBROW  . Lumbar laminectomy/decompression microdiscectomy  05/18/2011    Procedure: LUMBAR LAMINECTOMY/DECOMPRESSION MICRODISCECTOMY;  Surgeon: Johnn Hai;  Location: [facility];  Service: Orthopedics;  Laterality: Right;  Decompression Lumbar 4-Lumbar 5  Right    (xray)   . Cystoscopy with retrograde pyelogram, ureteroscopy and stent placement Bilateral 03/19/2013    Procedure: CYSTOSCOPY WITH RETROGRADE PYELOGRAM, BILATERAL URETEROSCOPY AND STENT PLACEMENT LEFT URETER,BILATERAL STONE EXTRACTION ,  HOLMIUM LASER LEFT URETER;  Surgeon: Molli Hazard, MD;  Location: [facility];  Service: Urology;  Laterality: Bilateral;  . Right lateral parotidectomy w/ nerve dissection / right modified radical neck dissection sparing scm eleventh nerve and internal jugular vein  09-12-2006  DR DWIGHT BATES    DR DWIGHT BATES; "inside gland; lots of lymph nodes"  . Cardiovascular stress test  03-21-2012  DR [facility]    NORMAL NUCLEAR STUDY/  NO ISCHEMIA/  EF 63%  . Cystoscopy w/ ureteral stent placement Left 03/26/2013     Procedure: CYSTOSCOPY WITH RETROGRADE PYELOGRAM ;  Surgeon: Molli Hazard, MD;  Location: [facility];  Service: Urology;  Laterality: Left;  . Cystoscopy with stent placement Left 03/26/2013    Procedure: CYSTOSCOPY WITH STENT PLACEMENT;  Surgeon: Molli Hazard, MD;  Location: [facility];  Service: Urology;  Laterality: Left;  . Cardiac catheterization  2006 ([place]PR[place])  [place]    NORMAL  . Cardiac catheterization  12/30/2014  . Back surgery    . Breast biopsy Bilateral early 2000's  . Diagnostic laparoscopy  04-12-2009  . Cardiac catheterization N/A 12/30/2014    Procedure: Left Heart Cath and Coronary Angiography;  Surgeon: Wellington Hampshire, MD;  Location: [facility];  Service: Cardiovascular;  Laterality: N/A;   Family History  Problem Relation Age of Onset  . Hypertension Mother   . COPD Mother   . Cancer Mother     breast  . Dementia Mother   . Heart disease Father   . Cancer Father     Colorectal  . Hyperlipidemia Father   . Hypertension Father    Social History  Substance Use Topics  . Smoking status: Never Smoker   . Smokeless tobacco: Never Used  . Alcohol Use: Yes     Comment: 12/30/2014 "might have a beer or glass of wine maybe once/month"   OB History    No data available     Review of Systems  Constitutional: Negative for fever and chills.  Respiratory: Positive for shortness of breath.   Gastrointestinal: Positive for nausea, abdominal pain and constipation. Negative for vomiting.  Neurological: Positive for dizziness.  All other systems reviewed and are negative.   Allergies  Cephalexin  Home Medications   Prior to Admission medications   Medication Sig Start Date End Date Taking? Authorizing Provider  aspirin 81 MG chewable tablet Chew 81 mg by mouth daily.   Yes Historical Provider, MD  atorvastatin (LIPITOR) 20 MG tablet TAKE ONE TABLET BY MOUTH ONCE DAILY 12/24/15  Yes Bhavinkumar Bhagat,  PA  Calcium Carb-Cholecalciferol (CALCIUM 600 + D) 600-200 MG-UNIT TABS Take 2 tablets by mouth daily.    Yes Historical Provider, MD  carvedilol (COREG) 3.125 MG tablet TAKE ONE TABLET BY MOUTH TWICE DAILY WITH  A  MEAL 12/24/15  Yes Bhavinkumar Bhagat, PA  gabapentin (NEURONTIN) 300 MG capsule Take 300 mg by mouth 3 (three) times daily.  02/07/15  Yes Historical Provider, MD  ketotifen (THERA TEARS ALLERGY) 0.025 % ophthalmic solution Apply 1 drop to eye daily as needed (for dry eye relief).   Yes Historical Provider, MD  lisinopril (PRINIVIL,ZESTRIL) 2.5 MG tablet TAKE ONE TABLET BY MOUTH ONCE DAILY 12/24/15  Yes Josue Hector, MD  magnesium oxide (MAG-OX) 400 MG tablet Take 400 mg by mouth daily.   Yes Historical Provider, MD  Methylcellulose, Laxative, (CITRUCEL) 500 MG TABS Take 2 tablets by mouth daily.   Yes Historical Provider,  MD  Multiple Vitamin (MULTIVITAMIN) tablet Take 1 tablet by mouth daily.   Yes Historical Provider, MD  nitroGLYCERIN (NITROSTAT) 0.4 MG SL tablet Place 1 tablet (0.4 mg total) under the tongue every 5 (five) minutes as needed for chest pain. 02/11/15  Yes Josue Hector, MD  pantoprazole (PROTONIX) 40 MG tablet Take 40 mg by mouth 2 (two) times daily.  04/06/15  Yes Historical Provider, MD  traMADol (ULTRAM) 50 MG tablet Take 50 mg by mouth every 6 (six) hours as needed for moderate pain.   Yes Historical Provider, MD   Triage Vitals: BP 119/76 mmHg  Pulse 70  Temp(Src) 97.8 F (36.6 C)  Resp 16  Ht 5\' 4"  (1.626 m)  Wt 190 lb (86.183 kg)  BMI 32.60 kg/m2  SpO2 95%  LMP 05/18/2011  Physical Exam  CONSTITUTIONAL: Well developed/well nourished HEAD: Normocephalic/atraumatic EYES: EOMI/PERRL ENMT: Mucous membranes moist NECK: supple no meningeal signs SPINE/BACK:entire spine nontender CV: S1/S2 noted, no murmurs/rubs/gallops noted LUNGS: Lungs are clear to auscultation bilaterally, no apparent distress ABDOMEN: soft, mild LUQ tenderness, no rebound or  guarding, bowel sounds noted throughout abdomen GU:no cva tenderness NEURO: Pt is awake/alert/appropriate, moves all extremitiesx4.  No facial droop.   EXTREMITIES: pulses normal/equal, full ROM SKIN: warm, color normal PSYCH: no abnormalities of mood noted, alert and oriented to situation   ED Course  Procedures  CRITICAL CARE Performed by: Sharyon Cable Total critical care time: 40 minutes Critical care time was exclusive of separately billable procedures and treating other patients. Critical care was necessary to treat or prevent imminent or life-threatening deterioration. Critical care was time spent personally by me on the following activities: development of treatment plan with patient and/or surrogate as well as nursing, discussions with consultants, evaluation of patient's response to treatment, examination of patient, obtaining history from patient or surrogate, ordering and performing treatments and interventions, ordering and review of laboratory studies, ordering and review of radiographic studies, pulse oximetry and re-evaluation of patient's condition. PATIENT WITH WORSENING HYPOXIA, HYPERCAPNEA REQUIRING BIPAP AND MONITORING  DIAGNOSTIC STUDIES: Oxygen Saturation is 95% on RA, adequate by my interpretation.   COORDINATION OF CARE: 12:17 PM- Will order pain and nausea medication, CXR and X-Ray of abdomen. Will order urinalysis. Pt verbalizes understanding and agrees to plan. 12:24 PM Pt stable She had cardiac cath one year ago that showed normal coronaries but had takutsobo cardiomyopathy Now has pain in LUQ on exam  I doubt ACS or other acute chest emergency Imaging pending at this time 1:18 PM Pt with episodes of hypoxia Lungs clear CXR negative Unclear cause of pain/hypoxia D-dimer ordered 2:09 PM D-dimer elevated Will need eval for PE CT scan not working V/Q study ordered 4:28 PM Awaiting v/q study Pt reporting increased SOB Repeat EKG  unchanged Repeat troponin ordered There may be some anxiety component.  Ativan ordered 6:54 PM All testing thus far negative However pt very somnolent, 2 hrs post ativan Will check ABG and given nebulized treatment 8:07 PM ABG REVEALS HYPERCAPNEA PT SOMNOLENT BUT AROUSABLE SHE RECEIVED ATIVAN AT 1630 BUT WOULD NOT EXPECT THIS TO CAUSE SUCH SOMNOLENCE SHE DENIES KNOWN H/O COPD MAY HAVE ?SLEEP APNEA BIPAP PLACED ON PATIENT WITH GOOD RESPONSE PATIENT DENIES PAIN MED USE AT HOME EXCEPT OCCASIONAL TRAMADOL D/W TRIAD FOR ADMISSION TO STEPDOWN UNIT   Medications  albuterol (PROVENTIL) (2.5 MG/3ML) 0.083% nebulizer solution 5 mg (not administered)  fentaNYL (SUBLIMAZE) injection 50 mcg (50 mcg Intravenous Given 12/26/15 1224)  ondansetron (ZOFRAN) injection 4 mg (  4 mg Intravenous Given 12/26/15 1224)  ipratropium-albuterol (DUONEB) 0.5-2.5 (3) MG/3ML nebulizer solution 3 mL (3 mLs Nebulization Given 12/26/15 1337)  morphine 4 MG/ML injection 4 mg (4 mg Intravenous Given 12/26/15 1428)  LORazepam (ATIVAN) injection 1 mg (1 mg Intravenous Given 12/26/15 1631)  technetium TC 23M diethylenetriame-pentaacetic acid (DTPA) injection 30 milli Curie (33 milli Curies Inhalation Given 12/26/15 1645)  technetium albumin aggregated (MAA) injection solution 4 milli Curie (4 milli Curies Intravenous Contrast Given 12/26/15 1705)    Labs Review Labs Reviewed  COMPREHENSIVE METABOLIC PANEL - Abnormal; Notable for the following:    BUN 23 (*)    Calcium 8.7 (*)    All other components within normal limits  CBC - Abnormal; Notable for the following:    Hemoglobin 11.9 (*)    HCT 35.8 (*)    Platelets 131 (*)    All other components within normal limits  URINALYSIS, ROUTINE W REFLEX MICROSCOPIC (NOT AT [facility]) - Abnormal; Notable for the following:    Color, Urine GREEN (*)    Hgb urine dipstick TRACE (*)    All other components within normal limits  D-DIMER, QUANTITATIVE (NOT AT [facility]) - Abnormal; Notable for the  following:    D-Dimer, Quant 0.53 (*)    All other components within normal limits  URINE MICROSCOPIC-ADD ON - Abnormal; Notable for the following:    Squamous Epithelial / LPF 6-30 (*)    Bacteria, UA FEW (*)    All other components within normal limits  LIPASE, BLOOD  TROPONIN I  TROPONIN I    Imaging Review Nm Pulmonary Perf And Vent  12/26/2015  CLINICAL DATA:  55 year old female with history of shortness of breath and chest pain for 1 day. EXAM: NUCLEAR MEDICINE VENTILATION - PERFUSION LUNG SCAN TECHNIQUE: Ventilation images were obtained in multiple projections using inhaled aerosol Tc-62m DTPA. Perfusion images were obtained in multiple projections after intravenous injection of Tc-23m MAA. RADIOPHARMACEUTICALS:  33 mCi Technetium-64m DTPA aerosol inhalation and 4 mCi Technetium-56m MAA IV COMPARISON:  No priors. FINDINGS: Ventilation: No focal ventilation defect. Perfusion: No wedge shaped peripheral perfusion defects to suggest acute pulmonary embolism. IMPRESSION: 1. Normal study.  No evidence of pulmonary embolism. Electronically Signed   By: Vinnie Langton M.D.   On: 12/26/2015 17:32   Dg Abd Acute W/chest  12/26/2015  CLINICAL DATA:  55 year old female with abdominal pain, nausea and weakness. EXAM: DG ABDOMEN ACUTE W/ 1V CHEST COMPARISON:  Chest x-ray 06/21/2015. Abdominal radiograph 03/12/2013. FINDINGS: Lung volumes are normal. No consolidative airspace disease. No pleural effusions. No pneumothorax. No pulmonary nodule or mass noted. Pulmonary vasculature and the cardiomediastinal silhouette are within normal limits. Gas and stool are seen scattered throughout the colon extending to the level of the distal rectum. Relatively large volume of well-formed stool throughout the colon and rectum may suggest constipation. No pathologic distension of small bowel is noted. No gross evidence of pneumoperitoneum. IMPRESSION: 1.  Nonobstructive bowel gas pattern. 2. No pneumoperitoneum. 3.  Large volume of stool throughout the colon and rectum may suggest constipation. 4. No radiographic evidence of acute cardiopulmonary disease. Electronically Signed   By: Vinnie Langton M.D.   On: 12/26/2015 13:08   I have personally reviewed and evaluated these images and lab results as part of my medical decision-making.   EKG Interpretation   Date/Time:  Sunday December 26 2015 11:05:24 EDT Ventricular Rate:  69 PR Interval:    QRS Duration: 106 QT Interval:  414 QTC Calculation: 444 R  Axis:   -53 Text Interpretation:  Sinus rhythm Left anterior fascicular block Low  voltage, extremity and precordial leads Abnormal R-wave progression, late  transition Nonspecific T abnormalities, anterior leads artifact noted No  significant change since last tracing Confirmed by Christy Gentles  MD, Numan Zylstra  [phone]) on 12/26/2015 12:05:17 PM      EKG Interpretation  Date/Time:  Sunday December 26 2015 16:19:07 EDT Ventricular Rate:  63 PR Interval:    QRS Duration: 98 QT Interval:  423 QTC Calculation: 433 R Axis:   -20 Text Interpretation:  Sinus rhythm Consider left atrial enlargement Borderline left axis deviation Probable anterior infarct, age indeterminate Confirmed by Christy Gentles  MD, Renan Danese ([id number]) on 12/26/2015 4:22:46 PM       MDM   Final diagnoses:  SOB (shortness of breath)  Acute respiratory failure with hypercapnia ([place])    Nursing notes including past medical history and social history reviewed and considered in documentation xrays/imaging reviewed by myself and considered during evaluation Labs/vital reviewed myself and considered during evaluation   I personally performed the services described in this documentation, which was scribed in my presence. The recorded information has been reviewed and is accurate.       Ripley Fraise, MD 12/26/15 2008

## 2015-12-26 NOTE — ED Notes (Signed)
Call to ICU for report- "Staffing problem" currently unable to take patient

## 2015-12-26 NOTE — ED Notes (Signed)
Pt c/o increasing LUQ abdominal pain and some throat pain.  States she feels like her breathing is more difficult.  Notified ER Physician and orders received.

## 2015-12-26 NOTE — ED Notes (Signed)
Report to [place], RN- pt will be going to room 10

## 2015-12-26 NOTE — ED Notes (Addendum)
Pt ambulated to restroom o2 maintained at 96% when ambulated back to room and obtained o2 read 84% sat on stretcher o2 increased to 92% RN & MD notified.

## 2015-12-26 NOTE — H&P (Signed)
History and Physical    [name] [id number] DOB: 1960-07-19 DOA: 12/26/2015  PCP: Claretta Fraise, MD Consultants:  [facility] - GI; [place]; [place] - GYN; [place] [facility] - ENT; [place] - Urology; [place] Patient coming from: home  Chief Complaint: abdominal pain  HPI: [name] is a 55 y.o. female with medical history significant of NSTEMI/Takatsubo CM in July 2016 and malignant melanoma as well as parotid CA.  She reports that she started feeling bad last night, with sharp stabbing pain in LUQ, constant.  Pain started about 2200 under her L breast.  Had some Tramadol so took one and went to bed, able to sleep ok.  Got up this AM and pain was even worse and she also felt woozy from the medicine so she was afraid to take another one and go to work.  Went to work but the longer she was there the worse it got.  Finally her Freight forwarder at [employer], [employer]) called EMS.  In ER, received Fentanyl and morphine and Zofran, nothing helped the pain.  The pain is still present now.  She was sent for V/Q scan, was given 1/2 dose of Ativan (about 1630) and has been excessively sleepy since.    ED Course:  As above, came in for LUQ pain.  Fentanyl and morphine did not seem to help the pain or cause sedation.  Patient with some hypoxia, given neb and sent for VQ scan.  Prior to VQ scan, given Ativan.  V/Q negative but patient extremely somnolent 2 hours later.  ABG showed hypercapnea, placed on bipap with good response.  Review of Systems: As per HPI; otherwise 10 point review of systems reviewed and negative.   Ambulatory Status: ambulatory  Past Medical History  Diagnosis Date  . PONV (postoperative nausea and vomiting)   . Left ureteral calculus   . History of palpitations     STRESS INDUCED  . Kidney stones   . Kidney cysts     "I think it was right"  . Hypercholesterolemia dx'd 12/2014  . Childhood asthma   . NSTEMI (non-ST elevated myocardial infarction) ([place]) 12/28/2014  . GERD  (gastroesophageal reflux disease)   . Daily headache   . Arthritis     back (12/30/2014)  . Anxiety   . Depression   . Malignant melanoma of skin of eyebrow ([place]) 2001     RIGHT SUPRAORBITAL (RIGHT FOREHEAD AND UPPER EYELID ----  S/P MOHS PROCDURE W/ SLN BX----   NO RECURRENCE  . 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 Takotsubo syndrome     "Broken heart syndrome"    Past Surgical History  Procedure Laterality Date  . Kidney surgery  1966    BILATERAL URETER'S DILATATION  . Melanoma excision with sentinel lymph node biopsy  2001    moh's procedure/  RIGHT FOREHEAD AND UPPER EYEBROW  . Lumbar laminectomy/decompression microdiscectomy  05/18/2011    Procedure: LUMBAR LAMINECTOMY/DECOMPRESSION MICRODISCECTOMY;  Surgeon: Johnn Hai;  Location: [facility];  Service: Orthopedics;  Laterality: Right;  Decompression Lumbar 4-Lumbar 5  Right    (xray)   . Cystoscopy with retrograde pyelogram, ureteroscopy and stent placement Bilateral 03/19/2013    Procedure: CYSTOSCOPY WITH RETROGRADE PYELOGRAM, BILATERAL URETEROSCOPY AND STENT PLACEMENT LEFT URETER,BILATERAL STONE EXTRACTION , HOLMIUM LASER LEFT URETER;  Surgeon: Molli Hazard, MD;  Location: [facility];  Service: Urology;  Laterality: Bilateral;  . Right lateral parotidectomy w/ nerve dissection / right modified radical neck dissection sparing scm eleventh nerve and internal jugular vein  09-12-2006  DR DWIGHT BATES    DR DWIGHT BATES; "inside gland; lots of lymph nodes"  . Cardiovascular stress test  03-21-2012  DR [facility]    NORMAL NUCLEAR STUDY/  NO ISCHEMIA/  EF 63%  . Cystoscopy w/ ureteral stent placement Left 03/26/2013    Procedure: CYSTOSCOPY WITH RETROGRADE PYELOGRAM ;  Surgeon: Molli Hazard, MD;  Location: [facility];  Service: Urology;  Laterality: Left;  . Cystoscopy with stent  placement Left 03/26/2013    Procedure: CYSTOSCOPY WITH STENT PLACEMENT;  Surgeon: Molli Hazard, MD;  Location: [facility];  Service: Urology;  Laterality: Left;  . Cardiac catheterization  2006 (APPROX)  [place]    NORMAL  . Cardiac catheterization  12/30/2014  . Back surgery    . Breast biopsy Bilateral early 2000's  . Diagnostic laparoscopy  04-12-2009  . Cardiac catheterization N/A 12/30/2014    Procedure: Left Heart Cath and Coronary Angiography;  Surgeon: Wellington Hampshire, MD;  Location: [facility];  Service: Cardiovascular;  Laterality: N/A;    Social History   Social History  . Marital Status: Married    Spouse Name: N/A  . Number of Children: N/A  . Years of Education: N/A   Occupational History  . 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 Smoking status: Never Smoker   . Smokeless tobacco: Never Used  . Alcohol Use: Yes     Comment: 12/30/2014 "might have a beer or glass of wine maybe once/month"  . Drug Use: No  . Sexual Activity: Not Currently   Other Topics Concern  . Not on file   Social History Narrative    Allergies  Allergen Reactions  . Cephalexin Shortness Of Breath and Rash    Family History  Problem Relation Age of Onset  . Hypertension Mother   . COPD Mother   . Cancer Mother     breast  . Dementia Mother   . Heart disease Father   . Cancer Father     Colorectal  . Hyperlipidemia Father   . Hypertension Father     Prior to Admission medications   Medication Sig Start Date End Date Taking? Authorizing Provider  aspirin 81 MG chewable tablet Chew 81 mg by mouth daily.   Yes Historical Provider, MD  atorvastatin (LIPITOR) 20 MG tablet TAKE ONE TABLET BY MOUTH ONCE DAILY 12/24/15  Yes Bhavinkumar Bhagat, PA  Calcium Carb-Cholecalciferol (CALCIUM 600 + D) 600-200 MG-UNIT TABS Take 2 tablets by mouth daily.    Yes Historical Provider, MD  carvedilol (COREG) 3.125 MG tablet TAKE ONE TABLET BY MOUTH  TWICE DAILY WITH  A  MEAL 12/24/15  Yes Bhavinkumar Bhagat, PA  gabapentin (NEURONTIN) 300 MG capsule Take 300 mg by mouth 3 (three) times daily.  02/07/15  Yes Historical Provider, MD  ketotifen (THERA TEARS ALLERGY) 0.025 % ophthalmic solution Apply 1 drop to eye daily as needed (for dry eye relief).   Yes Historical Provider, MD  lisinopril (PRINIVIL,ZESTRIL) 2.5 MG tablet TAKE ONE TABLET BY MOUTH ONCE DAILY 12/24/15  Yes Josue Hector, MD  magnesium oxide (MAG-OX) 400 MG tablet Take 400 mg by mouth daily.   Yes Historical Provider, MD  Methylcellulose, Laxative, (CITRUCEL) 500 MG TABS Take 2 tablets by mouth daily.  Yes Historical Provider, MD  Multiple Vitamin (MULTIVITAMIN) tablet Take 1 tablet by mouth daily.   Yes Historical Provider, MD  nitroGLYCERIN (NITROSTAT) 0.4 MG SL tablet Place 1 tablet (0.4 mg total) under the tongue every 5 (five) minutes as needed for chest pain. 02/11/15  Yes Josue Hector, MD  pantoprazole (PROTONIX) 40 MG tablet Take 40 mg by mouth 2 (two) times daily.  04/06/15  Yes Historical Provider, MD  traMADol (ULTRAM) 50 MG tablet Take 50 mg by mouth every 6 (six) hours as needed for moderate pain.   Yes Historical Provider, MD    Physical Exam: Filed Vitals:   12/26/15 1808 12/26/15 1900 12/26/15 1931 12/26/15 1952  BP: 111/93  108/94   Pulse: 77  89 78  Temp:   97.7 F (36.5 C)   TempSrc:   Oral   Resp: 9  16 11   Height:      Weight:      SpO2: 99% 98% 100% 99%     General: Appears somnolent but comfortable on BIPAP and is NAD Eyes:  PERRL, EOMI, normal lids, iris ENT:  grossly normal hearing, lips & tongue, mmm Neck:  no LAD, masses or thyromegaly Cardiovascular:  RRR, no m/r/g. No LE edema.  Respiratory:  CTA bilaterally, no w/r/r. Normal respiratory effort. Abdomen:  soft, ntnd, NABS Skin: She has erythema underlying both of her breasts but there are a few small erythematous macular lesions under the left breast that may be precursors to VZV  infection or may simply be benign subacute/chronic lesions Left breast without skin lesions or dimpling, there is redundant tissue but no appreciable masses, no discharge Musculoskeletal: grossly normal tone BUE/BLE, good ROM, no bony abnormality Psychiatric:  grossly normal mood and affect, speech fluent and appropriate, AOx3 - able to wake up and answer questions but generally still quite somnolent Neurologic: CN 2-12 grossly intact, moves all extremities in coordinated fashion, sensation intact  Labs on Admission: I have personally reviewed following labs and imaging studies  CBC:  Recent Labs Lab 12/26/15 1120  WBC 4.2  HGB 11.9*  HCT 35.8*  MCV 92.3  PLT [id number]*   Basic Metabolic Panel:  Recent Labs Lab 12/26/15 1120  NA 138  K 4.2  CL 103  CO2 28  GLUCOSE 99  BUN 23*  CREATININE 0.85  CALCIUM 8.7*   GFR: Estimated Creatinine Clearance: 77.6 mL/min (by C-G formula based on Cr of 0.85). Liver Function Tests:  Recent Labs Lab 12/26/15 1120  AST 27  ALT 23  ALKPHOS 66  BILITOT 0.6  PROT 7.2  ALBUMIN 4.3    Recent Labs Lab 12/26/15 1120  LIPASE 20   No results for input(s): AMMONIA in the last 168 hours. Coagulation Profile: No results for input(s): INR, PROTIME in the last 168 hours. Cardiac Enzymes:  Recent Labs Lab 12/26/15 1120 12/26/15 1728  TROPONINI <0.03 <0.03   BNP (last 3 results) No results for input(s): PROBNP in the last 8760 hours. HbA1C: No results for input(s): HGBA1C in the last 72 hours. CBG: No results for input(s): GLUCAP in the last 168 hours. Lipid Profile: No results for input(s): CHOL, HDL, LDLCALC, TRIG, CHOLHDL, LDLDIRECT in the last 72 hours. Thyroid Function Tests: No results for input(s): TSH, T4TOTAL, FREET4, T3FREE, THYROIDAB in the last 72 hours. Anemia Panel: No results for input(s): VITAMINB12, FOLATE, FERRITIN, TIBC, IRON, RETICCTPCT in the last 72 hours. Urine analysis:    Component Value Date/Time    COLORURINE GREEN* 12/26/2015 1307  APPEARANCEUR CLEAR 12/26/2015 1307   APPEARANCEUR Clear 10/05/2015 1506   LABSPEC 1.020 12/26/2015 1307   PHURINE 7.0 12/26/2015 1307   GLUCOSEU NEGATIVE 12/26/2015 1307   HGBUR TRACE* 12/26/2015 1307   BILIRUBINUR NEGATIVE 12/26/2015 1307   BILIRUBINUR Negative 10/05/2015 1506   BILIRUBINUR neg 04/02/2015 [place] 12/26/2015 1307   PROTEINUR NEGATIVE 12/26/2015 1307   PROTEINUR Negative 10/05/2015 1506   PROTEINUR neg 04/02/2015 1659   UROBILINOGEN 0.2 04/07/2015 1114   UROBILINOGEN negative 04/02/2015 1659   NITRITE NEGATIVE 12/26/2015 1307   NITRITE Negative 10/05/2015 1506   NITRITE neg 04/02/2015 1659   LEUKOCYTESUR NEGATIVE 12/26/2015 1307   LEUKOCYTESUR Trace* 10/05/2015 1506    Creatinine Clearance: Estimated Creatinine Clearance: 77.6 mL/min (by C-G formula based on Cr of 0.85).  Sepsis Labs: @LABRCNTIP (procalcitonin:4,lacticidven:4) )No results found for this or any previous visit (from the past 240 hour(s)).   Radiological Exams on Admission: Nm Pulmonary Perf And Vent  12/26/2015  CLINICAL DATA:  55 year old female with history of shortness of breath and chest pain for 1 day. EXAM: NUCLEAR MEDICINE VENTILATION - PERFUSION LUNG SCAN TECHNIQUE: Ventilation images were obtained in multiple projections using inhaled aerosol Tc-16m DTPA. Perfusion images were obtained in multiple projections after intravenous injection of Tc-75m MAA. RADIOPHARMACEUTICALS:  33 mCi Technetium-34m DTPA aerosol inhalation and 4 mCi Technetium-28m MAA IV COMPARISON:  No priors. FINDINGS: Ventilation: No focal ventilation defect. Perfusion: No wedge shaped peripheral perfusion defects to suggest acute pulmonary embolism. IMPRESSION: 1. Normal study.  No evidence of pulmonary embolism. Electronically Signed   By: Vinnie Langton M.D.   On: 12/26/2015 17:32   Dg Abd Acute W/chest  12/26/2015  CLINICAL DATA:  55 year old female with abdominal pain,  nausea and weakness. EXAM: DG ABDOMEN ACUTE W/ 1V CHEST COMPARISON:  Chest x-ray 06/21/2015. Abdominal radiograph 03/12/2013. FINDINGS: Lung volumes are normal. No consolidative airspace disease. No pleural effusions. No pneumothorax. No pulmonary nodule or mass noted. Pulmonary vasculature and the cardiomediastinal silhouette are within normal limits. Gas and stool are seen scattered throughout the colon extending to the level of the distal rectum. Relatively large volume of well-formed stool throughout the colon and rectum may suggest constipation. No pathologic distension of small bowel is noted. No gross evidence of pneumoperitoneum. IMPRESSION: 1.  Nonobstructive bowel gas pattern. 2. No pneumoperitoneum. 3. Large volume of stool throughout the colon and rectum may suggest constipation. 4. No radiographic evidence of acute cardiopulmonary disease. Electronically Signed   By: Vinnie Langton M.D.   On: 12/26/2015 13:08    EKG: Independently reviewed.  NSR with rate 63;no evidence of acute ischemia, NSCSLT  Assessment/Plan Principal Problem:   Respiratory failure (HCC) Active Problems:   GERD (gastroesophageal reflux disease)   Recent NSTEMI (Taktosubo event 12/30/14)   Hyperlipidemia   Essential (primary) hypertension   Abdominal pain    Hypercarbic respiratory failure -This is a very unusual situation and appears to be related to oversedation from medications.  She received Fentanyl (50 mcg) and Morphine (4mg ) without significant effect to pain or mental status and then 1 mg Ativan and became essentially obtunded with hypercapnea.   -Perhaps the medications had a stacking effect which led to the respiratory failure (particularly in conjunction with the Tramadol last night and her home doses of Neurontin). -If patient recovers overnight and is back to her baseline in the AM, I would suggest adding Ativan to her allergy list.  It is very unusual to require BIPAP following administration of a  medication  and would be safer to avoid it in the future if this is felt to be the culprit. -Will place patient in SDU unit overnight in Observation status with the hopes that the sedation will wear off and she will become more alert by the morning. -If patient is still somnolent in AM, would suggest repeating ABG at that time. -Will continue BIPAP overnight without other interventions at this time. -A CT chest/abdomen may also be warranted in AM if symptoms have not resolved.  As CT scanner is not available, the patient may require transport to [facility] for this study.  Abdominal pain -Initial description of pain was LUQ -With hypoxic episodes, PE was entertained.  D-dimer was positive.  CT not available but V/Q was negative -CE negative and EKG unchanged so this is unlikely ACS -Lipase was normal, amylase was not checked, will add on for completeness although this would also be an atypical presentation for pancreatitis -Has erythema under both pendulous breasts but the area under the left breast appears to be where the pain is, and the pain appears to be fairly superficial.  There are also a few scattered erythematous papules that could be a precursor to frank zoster.  This raises the concern for VZV infection/shingles.  She has had the shingles vaccine, but that doesn't eliminate the possibility of shingles.  She does take Neurontin, which helps with neuropathic pain, and so that also makes this diagnosis somewhat suspect.  However, will closely monitor the area for development of vesicular lesions c/w zoster and will treat accordingly if they develop. -She does have a slightly elevated BUN with normal creatinine; UGI bleeding should be monitored for but no evidence of this currently and Hgb is the same as it was in 1/17. -Otherwise, as above, could consider CT scan for further evaluation if the pain is ongoing.  GERD -Continue home Protonix  H/o NSTEMI/Takatsubo -There is no evidence to suggest that  this is recurrence, although those can happen. -It does not appear that patient has had a repeat Echo since 7/16 and so that might be a consideration for inpatient vs. Outpatient depending on her clinical course.  HTN -Continue home Zestril and Coreg  HLD -Continue home Lipitor   DVT prophylaxis:  Lovenox Code Status: Full - confirmed with patient/family Family Communication: Sister present at bedside throughout and in agreement with the plan Disposition Plan: Home once clinically improved Consults called: None Admission status: Obs status to SDU on BIPAP   Karmen Bongo MD [facility]  If 7PM-7AM, please contact night-coverage [email or web address] Password [email or web address]  12/26/2015, 9:20 PM

## 2015-12-26 NOTE — ED Notes (Signed)
Respiratory here with Bipap

## 2015-12-27 ENCOUNTER — Observation Stay (HOSPITAL_COMMUNITY): Payer: BLUE CROSS/BLUE SHIELD

## 2015-12-27 DIAGNOSIS — R1013 Epigastric pain: Secondary | ICD-10-CM | POA: Diagnosis not present

## 2015-12-27 DIAGNOSIS — I10 Essential (primary) hypertension: Secondary | ICD-10-CM | POA: Diagnosis not present

## 2015-12-27 DIAGNOSIS — E785 Hyperlipidemia, unspecified: Secondary | ICD-10-CM

## 2015-12-27 DIAGNOSIS — K219 Gastro-esophageal reflux disease without esophagitis: Secondary | ICD-10-CM | POA: Diagnosis not present

## 2015-12-27 LAB — CBC
HEMATOCRIT: 35.4 % — AB (ref 36.0–46.0)
HEMOGLOBIN: 11.6 g/dL — AB (ref 12.0–15.0)
MCH: 30.9 pg (ref 26.0–34.0)
MCHC: 32.8 g/dL (ref 30.0–36.0)
MCV: 94.4 fL (ref 78.0–100.0)
Platelets: 118 10*3/uL — ABNORMAL LOW (ref 150–400)
RBC: 3.75 MIL/uL — ABNORMAL LOW (ref 3.87–5.11)
RDW: 12.6 % (ref 11.5–15.5)
WBC: 4.7 10*3/uL (ref 4.0–10.5)

## 2015-12-27 LAB — BASIC METABOLIC PANEL
Anion gap: 6 (ref 5–15)
BUN: 21 mg/dL — ABNORMAL HIGH (ref 6–20)
CALCIUM: 8.5 mg/dL — AB (ref 8.9–10.3)
CHLORIDE: 99 mmol/L — AB (ref 101–111)
CO2: 30 mmol/L (ref 22–32)
Creatinine, Ser: 0.87 mg/dL (ref 0.44–1.00)
GLUCOSE: 101 mg/dL — AB (ref 65–99)
Potassium: 4.1 mmol/L (ref 3.5–5.1)
Sodium: 135 mmol/L (ref 135–145)

## 2015-12-27 LAB — MRSA PCR SCREENING: MRSA by PCR: NEGATIVE

## 2015-12-27 MED ORDER — MAGNESIUM CITRATE PO SOLN
0.5000 | Freq: Once | ORAL | Status: AC
  Administered 2015-12-27: 0.5 via ORAL
  Filled 2015-12-27: qty 296

## 2015-12-27 MED ORDER — IOPAMIDOL (ISOVUE-300) INJECTION 61%
100.0000 mL | Freq: Once | INTRAVENOUS | Status: AC | PRN
  Administered 2015-12-27: 100 mL via INTRAVENOUS

## 2015-12-27 MED ORDER — ACETAMINOPHEN 325 MG PO TABS
650.0000 mg | ORAL_TABLET | ORAL | Status: DC | PRN
  Administered 2015-12-27 – 2015-12-28 (×2): 650 mg via ORAL
  Filled 2015-12-27 (×2): qty 2

## 2015-12-27 MED ORDER — PSYLLIUM 95 % PO PACK
1.0000 | PACK | Freq: Every day | ORAL | Status: DC
  Administered 2015-12-27 – 2015-12-28 (×2): 1 via ORAL
  Filled 2015-12-27 (×3): qty 1

## 2015-12-27 NOTE — Progress Notes (Signed)
Report called to [facility] RN on unit 300. Patient transferred via wheelchair.

## 2015-12-27 NOTE — Progress Notes (Signed)
[facility] PROGRESS NOTE  [name] [id number] DOB: [birth year]    PCP:   Claretta Fraise, MD   HPI: [name] is an 55 y.o. 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  Her KUB showed severe stool burden.  She was given Ativan, and had hypoventilation requiring Bipap.  She also had negative V/Q Scan.  She is doing better today.   Rewiew of Systems:  Constitutional: Negative for malaise, fever and chills. No significant weight loss or weight gain Eyes: Negative for eye pain, redness and discharge, diplopia, visual changes, or flashes of light. ENMT: Negative for ear pain, hoarseness, nasal congestion, sinus pressure and sore throat. No headaches; tinnitus, drooling, or problem swallowing. Cardiovascular: Negative for chest pain, palpitations, diaphoresis, dyspnea and peripheral edema. ; No orthopnea, PND Respiratory: Negative for cough, hemoptysis, wheezing and stridor. No pleuritic chestpain. Gastrointestinal: Negative for nausea, vomiting, diarrhea, constipation, abdominal pain, melena, blood in stool, hematemesis, jaundice and rectal bleeding.    Genitourinary: Negative for frequency, dysuria, incontinence,flank pain and hematuria; Musculoskeletal: Negative for back pain and neck pain. Negative for swelling and trauma.;  Skin: . Negative for pruritus, rash, abrasions, bruising and skin lesion.; ulcerations Neuro: Negative for headache, lightheadedness and neck stiffness. Negative for weakness, altered level of consciousness , altered mental status, extremity weakness, burning feet, involuntary movement, seizure and syncope.  Psych: negative for anxiety, depression, insomnia, tearfulness, panic attacks, hallucinations, paranoia, suicidal or homicidal ideation    Past Medical History   Diagnosis Date  . PONV (postoperative nausea and vomiting)   . Left ureteral calculus   . History of palpitations     STRESS INDUCED  . Kidney stones   . Kidney cysts     "I think it was right"  . Hypercholesterolemia dx'd 12/2014  . Childhood asthma   . NSTEMI (non-ST elevated myocardial infarction) ([place]) 12/28/2014  . GERD (gastroesophageal reflux disease)   . Daily headache   . Arthritis     back (12/30/2014)  . Anxiety   . Depression   . 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 Takotsubo syndrome     "Broken heart syndrome"    Past Surgical History  Procedure Laterality Date  . Kidney surgery  1966    BILATERAL URETER'S DILATATION  . Melanoma excision with sentinel lymph node biopsy  2001    moh's procedure/  RIGHT FOREHEAD AND UPPER EYEBROW  . Lumbar laminectomy/decompression microdiscectomy  05/18/2011    Procedure: LUMBAR LAMINECTOMY/DECOMPRESSION MICRODISCECTOMY;  Surgeon: Johnn Hai;  Location: [facility];  Service: Orthopedics;  Laterality: Right;  Decompression Lumbar 4-Lumbar 5  Right    (xray)   . Cystoscopy with retrograde pyelogram, ureteroscopy and stent placement Bilateral 03/19/2013    Procedure: CYSTOSCOPY WITH RETROGRADE PYELOGRAM, BILATERAL URETEROSCOPY AND STENT PLACEMENT LEFT URETER,BILATERAL STONE EXTRACTION , HOLMIUM LASER LEFT URETER;  Surgeon: Molli Hazard, MD;  Location: [facility];  Service: Urology;  Laterality: Bilateral;  . Right lateral parotidectomy w/ nerve dissection / right modified  radical neck dissection sparing scm eleventh nerve and internal jugular vein  09-12-2006  DR DWIGHT BATES    DR DWIGHT BATES; "inside gland; lots of lymph nodes"  . Cardiovascular  stress test  03-21-2012  DR [facility]    NORMAL NUCLEAR STUDY/  NO ISCHEMIA/  EF 63%  . Cystoscopy w/ ureteral stent placement Left 03/26/2013    Procedure: CYSTOSCOPY WITH RETROGRADE PYELOGRAM ;  Surgeon: Molli Hazard, MD;  Location: [facility];  Service: Urology;  Laterality: Left;  . Cystoscopy with stent placement Left 03/26/2013    Procedure: CYSTOSCOPY WITH STENT PLACEMENT;  Surgeon: Molli Hazard, MD;  Location: [facility];  Service: Urology;  Laterality: Left;  . Cardiac catheterization  2006 ([place]PROX)  [place]    NORMAL  . Cardiac catheterization  12/30/2014  . Back surgery    . Breast biopsy Bilateral early 2000's  . Diagnostic laparoscopy  04-12-2009  . Cardiac catheterization N/A 12/30/2014    Procedure: Left Heart Cath and Coronary Angiography;  Surgeon: Wellington Hampshire, MD;  Location: [facility];  Service: Cardiovascular;  Laterality: N/A;    Medications:  HOME MEDS: Prior to Admission medications   Medication Sig Start Date End Date Taking? Authorizing Provider  aspirin 81 MG chewable tablet Chew 81 mg by mouth daily.   Yes Historical Provider, MD  atorvastatin (LIPITOR) 20 MG tablet TAKE ONE TABLET BY MOUTH ONCE DAILY 12/24/15  Yes Bhavinkumar Bhagat, PA  Calcium Carb-Cholecalciferol (CALCIUM 600 + D) 600-200 MG-UNIT TABS Take 2 tablets by mouth daily.    Yes Historical Provider, MD  carvedilol (COREG) 3.125 MG tablet TAKE ONE TABLET BY MOUTH TWICE DAILY WITH  A  MEAL 12/24/15  Yes Bhavinkumar Bhagat, PA  gabapentin (NEURONTIN) 300 MG capsule Take 300 mg by mouth 3 (three) times daily.  02/07/15  Yes Historical Provider, MD  ketotifen (THERA TEARS ALLERGY) 0.025 % ophthalmic solution Apply 1 drop to eye daily as needed (for dry eye relief).   Yes Historical Provider, MD  lisinopril (PRINIVIL,ZESTRIL) 2.5 MG tablet TAKE ONE TABLET BY MOUTH ONCE DAILY 12/24/15  Yes Josue Hector, MD  magnesium oxide (MAG-OX) 400 MG tablet Take  400 mg by mouth daily.   Yes Historical Provider, MD  Methylcellulose, Laxative, (CITRUCEL) 500 MG TABS Take 2 tablets by mouth daily.   Yes Historical Provider, MD  Multiple Vitamin (MULTIVITAMIN) tablet Take 1 tablet by mouth daily.   Yes Historical Provider, MD  nitroGLYCERIN (NITROSTAT) 0.4 MG SL tablet Place 1 tablet (0.4 mg total) under the tongue every 5 (five) minutes as needed for chest pain. 02/11/15  Yes Josue Hector, MD  pantoprazole (PROTONIX) 40 MG tablet Take 40 mg by mouth 2 (two) times daily.  04/06/15  Yes Historical Provider, MD  traMADol (ULTRAM) 50 MG tablet Take 50 mg by mouth every 6 (six) hours as needed for moderate pain.   Yes Historical Provider, MD     Allergies:  Allergies  Allergen Reactions  . Ativan [Lorazepam] Shortness Of Breath    She may be very sensitive to benzo.   . Cephalexin Shortness Of Breath and Rash    Social History:   reports that she has never smoked. She has never used smokeless tobacco. She reports that she drinks alcohol. She reports that she does not use illicit drugs.  Family History: Family History  Problem Relation Age of Onset  . Hypertension Mother   . COPD Mother   . Cancer  Mother     breast  . Dementia Mother   . Heart disease Father   . Cancer Father     Colorectal  . Hyperlipidemia Father   . Hypertension Father      Physical Exam: Filed Vitals:   12/27/15 0500 12/27/15 0600 12/27/15 0804 12/27/15 0918  BP:  112/71    Pulse: 72 119  72  Temp:   97.2 F (36.2 C)   TempSrc:   Oral   Resp: 11 10  14   Height:      Weight: 90.4 kg (199 lb 4.7 oz)     SpO2: 98% 100%  100%   Blood pressure 112/71, pulse 72, temperature 97.2 F (36.2 C), temperature source Oral, resp. rate 14, height 5\' 4"  (1.626 m), weight 90.4 kg (199 lb 4.7 oz), last menstrual period 05/18/2011, SpO2 100 %.  GEN:  Pleasant  patient lying in the stretcher in no acute distress; cooperative with exam. PSYCH:  alert and oriented x4; does not  appear anxious or depressed; affect is appropriate. HEENT: Mucous membranes pink and anicteric; PERRLA; EOM intact; no cervical lymphadenopathy nor thyromegaly or carotid bruit; no JVD; There were no stridor. Neck is very supple. Breasts:: Not examined CHEST WALL: No tenderness CHEST: Normal respiration, clear to auscultation bilaterally.  HEART: Regular rate and rhythm.  There are no murmur, rub, or gallops.   BACK: No kyphosis or scoliosis; no CVA tenderness ABDOMEN: soft and non-tender; no masses, no organomegaly, normal abdominal bowel sounds; no pannus; no intertriginous candida. There is no rebound and no distention. Rectal Exam: Not done EXTREMITIES: No bone or joint deformity; age-appropriate arthropathy of the hands and knees; no edema; no ulcerations.  There is no calf tenderness. Genitalia: not examined PULSES: 2+ and symmetric SKIN: Normal hydration no rash or ulceration CNS: Cranial nerves 2-12 grossly intact no focal lateralizing neurologic deficit.  Speech is fluent; uvula elevated with phonation, facial symmetry and tongue midline. DTR are normal bilaterally, cerebella exam is intact, barbinski is negative and strengths are equaled bilaterally.  No sensory loss.   Labs on Admission:  Basic Metabolic Panel:  Recent Labs Lab 12/26/15 1120 12/27/15 0615  NA 138 135  K 4.2 4.1  CL 103 99*  CO2 28 30  GLUCOSE 99 101*  BUN 23* 21*  CREATININE 0.85 0.87  CALCIUM 8.7* 8.5*   Liver Function Tests:  Recent Labs Lab 12/26/15 1120  AST 27  ALT 23  ALKPHOS 66  BILITOT 0.6  PROT 7.2  ALBUMIN 4.3    Recent Labs Lab 12/26/15 1120 12/26/15 1728  LIPASE 20  --   AMYLASE  --  37   No results for input(s): AMMONIA in the last 168 hours. CBC:  Recent Labs Lab 12/26/15 1120 12/27/15 0615  WBC 4.2 4.7  HGB 11.9* 11.6*  HCT 35.8* 35.4*  MCV 92.3 94.4  PLT 131* 118*   Cardiac Enzymes:  Recent Labs Lab 12/26/15 1120 12/26/15 1728  TROPONINI <0.03 <0.03     Radiological Exams on Admission: Nm Pulmonary Perf And Vent  12/26/2015  CLINICAL DATA:  55 year old female with history of shortness of breath and chest pain for 1 day. EXAM: NUCLEAR MEDICINE VENTILATION - PERFUSION LUNG SCAN TECHNIQUE: Ventilation images were obtained in multiple projections using inhaled aerosol Tc-14m DTPA. Perfusion images were obtained in multiple projections after intravenous injection of Tc-19m MAA. RADIOPHARMACEUTICALS:  33 mCi Technetium-29m DTPA aerosol inhalation and 4 mCi Technetium-38m MAA IV COMPARISON:  No priors. FINDINGS: Ventilation: No focal  ventilation defect. Perfusion: No wedge shaped peripheral perfusion defects to suggest acute pulmonary embolism. IMPRESSION: 1. Normal study.  No evidence of pulmonary embolism. Electronically Signed   By: Vinnie Langton M.D.   On: 12/26/2015 17:32   Dg Abd Acute W/chest  12/26/2015  CLINICAL DATA:  55 year old female with abdominal pain, nausea and weakness. EXAM: DG ABDOMEN ACUTE W/ 1V CHEST COMPARISON:  Chest x-ray 06/21/2015. Abdominal radiograph 03/12/2013. FINDINGS: Lung volumes are normal. No consolidative airspace disease. No pleural effusions. No pneumothorax. No pulmonary nodule or mass noted. Pulmonary vasculature and the cardiomediastinal silhouette are within normal limits. Gas and stool are seen scattered throughout the colon extending to the level of the distal rectum. Relatively large volume of well-formed stool throughout the colon and rectum may suggest constipation. No pathologic distension of small bowel is noted. No gross evidence of pneumoperitoneum. IMPRESSION: 1.  Nonobstructive bowel gas pattern. 2. No pneumoperitoneum. 3. Large volume of stool throughout the colon and rectum may suggest constipation. 4. No radiographic evidence of acute cardiopulmonary disease. Electronically Signed   By: Vinnie Langton M.D.   On: 12/26/2015 13:08   Assessment/Plan Present on Admission:  . Recent NSTEMI ([place]ubo  event 12/30/14) . Hyperlipidemia . Essential (primary) hypertension . GERD (gastroesophageal reflux disease) . Abdominal pain  PLAN:  Abdominal pain:  Unclear exact etiology, but suspicious for obstipation.  Will give Magcitrate and dulcolax, and obtain abdominal pelvic CT.  She could have a kidney stone, though her UA was negative.  Can transfer to telemetry today.   Respiratory failure:  Hypercapneic.  She was told to avoid benzo, and her chart was marked as allergy to Ativan.  GERD:  Stable.  Severe Anxiety:  She would benefit taking an SSRI.  Will defer to PCP.    Other plans as per orders. Code Status: FULL Haskel Khan, MD.  FACP [facility] Pager [phone] 7pm to 7am.  12/27/2015, 10:22 AM

## 2015-12-27 NOTE — Progress Notes (Signed)
Patient taken off Bipap during transport upstairs and not restarted.  I went to check on patient.  She appeared comfortable and less somnolent but I requested repeat ABG.  ABG shows improved hypercapnea but still elevated pCO2 and persistent hypoxia.  Will ask that the patient's BIPAP be resumed and continued through the night.  She can try to come off again in the AM.  Carlyon Shadow, M.D.

## 2015-12-28 ENCOUNTER — Observation Stay (HOSPITAL_COMMUNITY): Payer: BLUE CROSS/BLUE SHIELD

## 2015-12-28 DIAGNOSIS — I10 Essential (primary) hypertension: Secondary | ICD-10-CM

## 2015-12-28 DIAGNOSIS — K219 Gastro-esophageal reflux disease without esophagitis: Secondary | ICD-10-CM

## 2015-12-28 DIAGNOSIS — E785 Hyperlipidemia, unspecified: Secondary | ICD-10-CM | POA: Diagnosis not present

## 2015-12-28 MED ORDER — POLYETHYLENE GLYCOL (MIRALAX) NICU SYRINGE 0.34GM/ML: 17.0000 g | Freq: Two times a day (BID) | ORAL | Status: DC

## 2015-12-28 NOTE — Discharge Summary (Signed)
Physician Discharge Summary  [name] [id number] DOB: Jun 27, 1960 DOA: 12/26/2015  PCP: Claretta Fraise, MD  Admit date: 12/26/2015 Discharge date: 12/28/2015  Time spent: 35 minutes  Recommendations for Outpatient Follow-up:  1. Follow up with PCP for further work up.    Discharge Diagnoses:  Principal Problem:   Respiratory failure ([place]) Active Problems:   GERD (gastroesophageal reflux disease)   Recent NSTEMI (Taktosubo event 12/30/14)   Hyperlipidemia   Essential (primary) hypertension   Abdominal pain   Discharge Condition: improved without problem breathing.  Still has left lower rib area tenderness to palpation.   Diet recommendation:  Heart healthy.    Filed Weights   12/26/15 1106 12/26/15 2315 12/27/15 0500  Weight: 86.183 kg (190 lb) 90.1 kg (198 lb 10.2 oz) 90.4 kg (199 lb 4.7 oz)    History of present illness: Patient was admitted for abdominal pain on December 26, 2015 by Dr Thomasenia Bottoms.  As per her H and P:  "[name] is a 55 y.o. female with medical history significant of NSTEMI/Takatsubo CM in July 2016 and malignant melanoma as well as parotid CA. She reports that she started feeling bad last night, with sharp stabbing pain in LUQ, constant. Pain started about 2200 under her L breast. Had some Tramadol so took one and went to bed, able to sleep ok. Got up this AM and pain was even worse and she also felt woozy from the medicine so she was afraid to take another one and go to work. Went to work but the longer she was there the worse it got. Finally her Freight forwarder at [employer], [employer]) called EMS. In ER, received Fentanyl and morphine and Zofran, nothing helped the pain. The pain is still present now. She was sent for V/Q scan, was given 1/2 dose of Ativan (about 1630) and has been excessively sleepy since.    Hospital Course: Patient was admitted into the ICU, as she was hypercapneic with hypoventilation requiring bipap after giving her Ativan.  She was  deemed sensitive to benzo, and was told of this.  Her chart was marked with allergy to Ativan.  She came off the bipap the following morning when I saw her, and had no further problems.  With respect to her abdominal pain, originally, it was thought she had constipation.  A abdominal pelvic CT with contrast was done, and it was negative for any acute process.  She did have lots of stool.  Mag citrate and dulcolax was given, and she had 4 BMs, but she still had pain after that, so a rib film was done, but it did not show any fracture.  She did have frequent sneezing, so it was postulated that she may have strain a intercostal muscle.  She was told that I was n't sure, and she will likely need follow up.  However, it was felt that she is stable for discharge, and will be discharged today with PCP follow up.  Thank you and Good Day.    Consultations:  None.   Discharge Exam: Filed Vitals:   12/28/15 0658 12/28/15 1514  BP: 132/64 122/72  Pulse: 87 63  Temp: 98.5 F (36.9 C) 98.4 F (36.9 C)  Resp: 16 16    Discharge Instructions   Discharge Instructions    Diet - low sodium heart healthy    Complete by:  As directed      Discharge instructions    Complete by:  As directed   Follow up with your  PCP next week.   Take your medicine as directed.     Increase activity slowly    Complete by:  As directed           Current Discharge Medication List    START taking these medications   Details  polyethylene glycol (MIRALAX) 0.34 gm/ml SOLN Take 50 mLs (17 g total) by mouth every 12 (twelve) hours. Qty: 300 mL, Refills: 2      CONTINUE these medications which have NOT CHANGED   Details  aspirin 81 MG chewable tablet Chew 81 mg by mouth daily.    atorvastatin (LIPITOR) 20 MG tablet TAKE ONE TABLET BY MOUTH ONCE DAILY Qty: 30 tablet, Refills: 5    Calcium Carb-Cholecalciferol (CALCIUM 600 + D) 600-200 MG-UNIT TABS Take 2 tablets by mouth daily.     carvedilol (COREG) 3.125 MG tablet  TAKE ONE TABLET BY MOUTH TWICE DAILY WITH  A  MEAL Qty: 60 tablet, Refills: 5    gabapentin (NEURONTIN) 300 MG capsule Take 300 mg by mouth 3 (three) times daily.     ketotifen (THERA TEARS ALLERGY) 0.025 % ophthalmic solution Apply 1 drop to eye daily as needed (for dry eye relief).    lisinopril (PRINIVIL,ZESTRIL) 2.5 MG tablet TAKE ONE TABLET BY MOUTH ONCE DAILY Qty: 30 tablet, Refills: 3    magnesium oxide (MAG-OX) 400 MG tablet Take 400 mg by mouth daily.    Multiple Vitamin (MULTIVITAMIN) tablet Take 1 tablet by mouth daily.    nitroGLYCERIN (NITROSTAT) 0.4 MG SL tablet Place 1 tablet (0.4 mg total) under the tongue every 5 (five) minutes as needed for chest pain. Qty: 25 tablet, Refills: 3    pantoprazole (PROTONIX) 40 MG tablet Take 40 mg by mouth 2 (two) times daily.       STOP taking these medications     Methylcellulose, Laxative, (CITRUCEL) 500 MG TABS      traMADol (ULTRAM) 50 MG tablet        Allergies  Allergen Reactions  . Ativan [Lorazepam] Shortness Of Breath    She may be very sensitive to benzo.   . Cephalexin Shortness Of Breath and Rash      The results of significant diagnostics from this hospitalization (including imaging, microbiology, ancillary and laboratory) are listed below for reference.    Significant Diagnostic Studies: Dg Ribs Unilateral Left  12/28/2015  CLINICAL DATA:  Left rib pain, greater anteriorly, for the last 3 days. No known injury. EXAM: LEFT RIBS - 2 VIEW COMPARISON:  Chest and abdomen radiographs dated 12/26/2015 and chest CT dated 06/21/2015. FINDINGS: Normal sized heart. Small amount of linear density at the left lung base with improvement. Left breast surgical clip. No visible left rib abnormalities. Thoracic spine degenerative changes. IMPRESSION: 1. Minimal linear atelectasis or scarring at the left lung base with improvement. 2. No visible rib abnormalities. Electronically Signed   By: Claudie Revering M.D.   On: 12/28/2015  14:13   Ct Abdomen Pelvis W Contrast  12/27/2015  CLINICAL DATA:  Severe left upper quadrant pain and nausea since July 8th. EXAM: CT ABDOMEN AND PELVIS WITH CONTRAST TECHNIQUE: Multidetector CT imaging of the abdomen and pelvis was performed using the standard protocol following bolus administration of intravenous contrast. CONTRAST:  170mL ISOVUE-300 IOPAMIDOL (ISOVUE-300) INJECTION 61% COMPARISON:  CT abdomen dated 04/07/2015. FINDINGS: Lower chest:  No acute findings. Hepatobiliary: Liver and gallbladder appear normal. Pancreas: Partially infiltrated with fat but otherwise unremarkable. Spleen: Within normal limits in size and appearance. Adrenals/Urinary  Tract: Adrenal glands appear normal. Stable punctate nonobstructing stone within the mid pole left renal pelvis. Previous study showed additional punctate stones within the lower pole left renal pelvis, not clearly seen today. No hydronephrosis on the left. Right kidney appears normal without mass, stone or hydronephrosis. There is stable mild prominence of the bilateral ureters. No ureteral or bladder calculi identified. Bladder is unremarkable. Stomach/Bowel: Bowel is normal in caliber. No bowel wall thickening or evidence of bowel wall inflammation. Moderate amount of stool throughout the nondistended colon. Appendix is normal. Stomach appears normal. Vascular/Lymphatic: Mild atherosclerotic changes of the normal caliber abdominal aorta. No enlarged lymph nodes seen within the abdomen or pelvis. Reproductive: No mass or other significant abnormality. Other: No free fluid or abscess collection. No free intraperitoneal air. Musculoskeletal: Degenerative changes of the thoracolumbar spine, mild to moderate in degree. No acute or suspicious osseous finding. IMPRESSION: 1. No acute findings within the abdomen or pelvis. 2. Punctate nonobstructing left renal stone. No hydronephrosis. No ureteral or bladder calculi identified. No perinephric inflammation. 3.  Aortic atherosclerosis. 4. Moderate amount of stool throughout the nondistended colon (constipation? ). Electronically Signed   By: Franki Cabot M.D.   On: 12/27/2015 15:23   Nm Pulmonary Perf And Vent  12/26/2015  CLINICAL DATA:  55 year old female with history of shortness of breath and chest pain for 1 day. EXAM: NUCLEAR MEDICINE VENTILATION - PERFUSION LUNG SCAN TECHNIQUE: Ventilation images were obtained in multiple projections using inhaled aerosol Tc-35m DTPA. Perfusion images were obtained in multiple projections after intravenous injection of Tc-15m MAA. RADIOPHARMACEUTICALS:  33 mCi Technetium-9m DTPA aerosol inhalation and 4 mCi Technetium-9m MAA IV COMPARISON:  No priors. FINDINGS: Ventilation: No focal ventilation defect. Perfusion: No wedge shaped peripheral perfusion defects to suggest acute pulmonary embolism. IMPRESSION: 1. Normal study.  No evidence of pulmonary embolism. Electronically Signed   By: Vinnie Langton M.D.   On: 12/26/2015 17:32   Dg Abd Acute W/chest  12/26/2015  CLINICAL DATA:  55 year old female with abdominal pain, nausea and weakness. EXAM: DG ABDOMEN ACUTE W/ 1V CHEST COMPARISON:  Chest x-ray 06/21/2015. Abdominal radiograph 03/12/2013. FINDINGS: Lung volumes are normal. No consolidative airspace disease. No pleural effusions. No pneumothorax. No pulmonary nodule or mass noted. Pulmonary vasculature and the cardiomediastinal silhouette are within normal limits. Gas and stool are seen scattered throughout the colon extending to the level of the distal rectum. Relatively large volume of well-formed stool throughout the colon and rectum may suggest constipation. No pathologic distension of small bowel is noted. No gross evidence of pneumoperitoneum. IMPRESSION: 1.  Nonobstructive bowel gas pattern. 2. No pneumoperitoneum. 3. Large volume of stool throughout the colon and rectum may suggest constipation. 4. No radiographic evidence of acute cardiopulmonary disease.  Electronically Signed   By: Vinnie Langton M.D.   On: 12/26/2015 13:08    Microbiology: Recent Results (from the past 240 hour(s))  MRSA PCR Screening     Status: None   Collection Time: 12/26/15 11:00 PM  Result Value Ref Range Status   MRSA by PCR NEGATIVE NEGATIVE Final    Comment:        The GeneXpert MRSA Assay (FDA approved for NASAL specimens only), is one component of a comprehensive MRSA colonization surveillance program. It is not intended to diagnose MRSA infection nor to guide or monitor treatment for MRSA infections.      Labs: Basic Metabolic Panel:  Recent Labs Lab 12/26/15 1120 12/27/15 0615  NA 138 135  K 4.2 4.1  CL  103 99*  CO2 28 30  GLUCOSE 99 101*  BUN 23* 21*  CREATININE 0.85 0.87  CALCIUM 8.7* 8.5*   Liver Function Tests:  Recent Labs Lab 12/26/15 1120  AST 27  ALT 23  ALKPHOS 66  BILITOT 0.6  PROT 7.2  ALBUMIN 4.3    Recent Labs Lab 12/26/15 1120 12/26/15 1728  LIPASE 20  --   AMYLASE  --  37   CBC:  Recent Labs Lab 12/26/15 1120 12/27/15 0615  WBC 4.2 4.7  HGB 11.9* 11.6*  HCT 35.8* 35.4*  MCV 92.3 94.4  PLT 131* 118*   Cardiac Enzymes:  Recent Labs Lab 12/26/15 1120 12/26/15 1728  TROPONINI <0.03 <0.03   BNP: BNP (last 3 results)  Recent Labs  12/29/14 2300  BNP 620.8*    Signed:  Shaniyah Wix MD. Rosalita Chessman.  [facility] 12/28/2015, 3:50 PM

## 2015-12-28 NOTE — Care Management Note (Signed)
Case Management Note  Patient Details  Name: SANI DIERSEN MRN: [id number] Date of Birth: [birth year]  Subjective/Objective: Patient admitted from home with respiratory failure. She is ind with ADL's. Her PCP is Dr. Livia Snellen. She has [employer], and reports no issues with obtaining medications.                 Action/Plan: Anticipate d/c home with self care. No CM needs.    Expected Discharge Date:  12/28/15               Expected Discharge Plan:  Home/Self Care  In-House Referral:  NA  Discharge planning Services  CM Consult  Post Acute Care Choice:  NA Choice offered to:  NA  DME Arranged:    DME Agency:     HH Arranged:    HH Agency:     Status of Service:  Completed, signed off  If discussed at [employer] of Stay Meetings, dates discussed:    Additional Comments:  Josealberto Montalto, Chauncey Reading, RN 12/28/2015, 12:39 PM

## 2015-12-28 NOTE — Progress Notes (Signed)
Iv d/c with site wnl.  Belongings with pt. Pt transported via w/c by RN for d/c. Pt stable at d/c.

## 2015-12-29 ENCOUNTER — Ambulatory Visit (INDEPENDENT_AMBULATORY_CARE_PROVIDER_SITE_OTHER): Payer: BLUE CROSS/BLUE SHIELD | Admitting: Pediatrics

## 2015-12-29 ENCOUNTER — Encounter: Payer: Self-pay | Admitting: Pediatrics

## 2015-12-29 VITALS — BP 106/66 | HR 72 | Temp 97.4°F | Ht 64.0 in | Wt 191.2 lb

## 2015-12-29 DIAGNOSIS — R1013 Epigastric pain: Secondary | ICD-10-CM

## 2015-12-29 DIAGNOSIS — R067 Sneezing: Secondary | ICD-10-CM | POA: Diagnosis not present

## 2015-12-29 DIAGNOSIS — I10 Essential (primary) hypertension: Secondary | ICD-10-CM | POA: Diagnosis not present

## 2015-12-29 DIAGNOSIS — K5900 Constipation, unspecified: Secondary | ICD-10-CM | POA: Diagnosis not present

## 2015-12-29 DIAGNOSIS — K219 Gastro-esophageal reflux disease without esophagitis: Secondary | ICD-10-CM

## 2015-12-29 DIAGNOSIS — I214 Non-ST elevation (NSTEMI) myocardial infarction: Secondary | ICD-10-CM | POA: Diagnosis not present

## 2015-12-29 NOTE — Progress Notes (Signed)
Subjective:    Patient ID: [name], female    DOB: Nov 06, 1960, 55 y.o.   MRN: [id number]  CC: hospital f/u  HPI: [name] is a 55 y.o. 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 She received ativan prior to a V/Q scan and became hypercapneic with hypoventilation requiring bipap and ICU admission overnight. Found to have lots of stool, started clean out. Thought pain possibly due to strained intercostal muscle.  Still with some abdominal pain. Pain comes and goes. H/o kidney stones, this feels different than prior kidney stone pain. Frequent UTIs, followed by urology  Pt here with husband [name]  I have reviewed the hospital records and labs.  Depression screen [facility] 2/9 12/29/2015 06/18/2015 06/11/2015 01/26/2015 10/30/2014  Decreased Interest 0 0 0 0 0  Down, Depressed, Hopeless 0 0 0 0 3  PHQ - 2 Score 0 0 0 0 3  Altered sleeping - - - - 0  Tired, decreased energy - - - - 0  Change in appetite - - - - 0  Feeling bad or failure about yourself  - - - - 0  Trouble concentrating - - - - 0  Moving slowly or fidgety/restless - - - - 0  Suicidal thoughts - - - - 0  PHQ-9 Score - - - - 3     Relevant past medical, surgical, family and social history reviewed and updated as indicated.  Interim medical history since our last visit reviewed. Allergies and medications reviewed and updated.  ROS: All systems negative other than what is in HPI  History  Smoking status  . Never Smoker   Smokeless tobacco  . Never Used       Objective:    BP 106/66 mmHg  Pulse 72  Temp(Src) 97.4 F (36.3 C) (Oral)  Ht 5\' 4"  (1.626 m)  Wt 191 lb 3.2 oz (86.728 kg)  BMI 32.80 kg/m2  LMP 05/18/2011  Wt Readings from Last 3 Encounters:  12/29/15 191 lb 3.2 oz (86.728 kg)  12/27/15 199 lb 4.7 oz (90.4 kg)  10/05/15  194 lb 6.4 oz (88.179 kg)     Gen: NAD, alert, cooperative with exam, NCAT EYES: EOMI, no scleral injection or icterus ENT:  OP without erythema, pale nasal mucosa, no erythema  LYMPH: no cervical LAD CV: NRRR, normal S1/S2, no murmur, distal pulses 2+ b/l Resp: CTABL, no wheezes, normal WOB Abd: +BS, soft, NTND. no guarding or organomegaly Ext: No edema, warm Neuro: Alert and oriented, strength equal b/l UE and LE, coordination grossly normal MSK: tender with palpation over Lower L ribs     Assessment & Plan:   [name] is a 55 y.o. female presenting for hospital follow up. I have reviewed the hospital records including labs and pertinent imaging.  [name] was seen today for hospitalization follow-up.  Diagnoses and all orders for this visit:  Constipation, unspecified constipation type Take stool softener such as docusate every day. Take miralax every 2 hours until stools become clear. That is how we know you are completely "cleaned out". Keep taking miralax once or twice a day as needed to have daily bowel movements in future. Magnesium citrate prn  Essential (primary) hypertension Well controlled today  Recent NSTEMI (Taktosubo event 12/30/14) No chest pain, heart appropriate size in hosp imaging  Gastroesophageal  reflux disease, esophagitis presence not specified Symptoms different than GER symptoms  Sneezing Violent sneezing 7-9 times happens a few times a month. Trial allergy med. Strained intercostal muscle could be contributing to pain.   Follow up plan: Return if symptoms worsen or fail to improve.  Assunta Found, MD [facility] 12/29/2015, 9:41 AM

## 2015-12-29 NOTE — Patient Instructions (Signed)
Take stool softener such as docusate every day. Take miralax every 2 hours until stools become clear. That is how we know you are completely "cleaned out". Keep taking miralax once or twice a day as needed to have daily bowel movements in future. Magnesium citrate can help the colon muscles move if having trouble with bowel movements.

## 2016-01-02 ENCOUNTER — Encounter (HOSPITAL_COMMUNITY): Payer: Self-pay

## 2016-01-02 ENCOUNTER — Emergency Department (HOSPITAL_COMMUNITY): Payer: BLUE CROSS/BLUE SHIELD

## 2016-01-02 ENCOUNTER — Emergency Department (HOSPITAL_COMMUNITY)
Admission: EM | Admit: 2016-01-02 | Discharge: 2016-01-03 | Disposition: A | Discharge status: Home / Self Care | Payer: BLUE CROSS/BLUE SHIELD | Attending: Emergency Medicine | Admitting: Emergency Medicine

## 2016-01-02 ENCOUNTER — Other Ambulatory Visit: Payer: Self-pay

## 2016-01-02 DIAGNOSIS — Z79899 Other long term (current) drug therapy: Secondary | ICD-10-CM | POA: Insufficient documentation

## 2016-01-02 DIAGNOSIS — R1012 Left upper quadrant pain: Secondary | ICD-10-CM | POA: Diagnosis not present

## 2016-01-02 DIAGNOSIS — R109 Unspecified abdominal pain: Secondary | ICD-10-CM

## 2016-01-02 DIAGNOSIS — Z85840 Personal history of malignant neoplasm of eye: Secondary | ICD-10-CM | POA: Insufficient documentation

## 2016-01-02 DIAGNOSIS — K5900 Constipation, unspecified: Secondary | ICD-10-CM | POA: Insufficient documentation

## 2016-01-02 DIAGNOSIS — I252 Old myocardial infarction: Secondary | ICD-10-CM | POA: Diagnosis not present

## 2016-01-02 DIAGNOSIS — Z7982 Long term (current) use of aspirin: Secondary | ICD-10-CM | POA: Insufficient documentation

## 2016-01-02 LAB — CBC
HEMATOCRIT: 36.9 % (ref 36.0–46.0)
HEMOGLOBIN: 12.4 g/dL (ref 12.0–15.0)
MCH: 30.6 pg (ref 26.0–34.0)
MCHC: 33.6 g/dL (ref 30.0–36.0)
MCV: 91.1 fL (ref 78.0–100.0)
Platelets: 178 10*3/uL (ref 150–400)
RBC: 4.05 MIL/uL (ref 3.87–5.11)
RDW: 12.4 % (ref 11.5–15.5)
WBC: 5.6 10*3/uL (ref 4.0–10.5)

## 2016-01-02 LAB — COMPREHENSIVE METABOLIC PANEL
ALBUMIN: 4.1 g/dL (ref 3.5–5.0)
ALK PHOS: 62 U/L (ref 38–126)
ALT: 30 U/L (ref 14–54)
ANION GAP: 8 (ref 5–15)
AST: 51 U/L — ABNORMAL HIGH (ref 15–41)
BUN: 28 mg/dL — ABNORMAL HIGH (ref 6–20)
CALCIUM: 9.5 mg/dL (ref 8.9–10.3)
CO2: 25 mmol/L (ref 22–32)
Chloride: 99 mmol/L — ABNORMAL LOW (ref 101–111)
Creatinine, Ser: 1.18 mg/dL — ABNORMAL HIGH (ref 0.44–1.00)
GFR calc Af Amer: 58 mL/min — ABNORMAL LOW (ref >60)
GFR calc non Af Amer: 50 mL/min — ABNORMAL LOW (ref >60)
GLUCOSE: 94 mg/dL (ref 65–99)
Potassium: 4.8 mmol/L (ref 3.5–5.1)
SODIUM: 132 mmol/L — AB (ref 135–145)
Total Bilirubin: 0.6 mg/dL (ref 0.3–1.2)
Total Protein: 7.1 g/dL (ref 6.5–8.1)

## 2016-01-02 LAB — URINE MICROSCOPIC-ADD ON: BACTERIA UA: NONE SEEN

## 2016-01-02 LAB — URINALYSIS, ROUTINE W REFLEX MICROSCOPIC
BILIRUBIN URINE: NEGATIVE
GLUCOSE, UA: NEGATIVE mg/dL
Ketones, ur: NEGATIVE mg/dL
Leukocytes, UA: NEGATIVE
Nitrite: NEGATIVE
PH: 5.5 (ref 5.0–8.0)
Protein, ur: NEGATIVE mg/dL
SPECIFIC GRAVITY, URINE: 1.013 (ref 1.005–1.030)

## 2016-01-02 LAB — I-STAT TROPONIN, ED: Troponin i, poc: 0 ng/mL (ref 0.00–0.08)

## 2016-01-02 LAB — LIPASE, BLOOD: Lipase: 17 U/L (ref 11–51)

## 2016-01-02 MED ORDER — ACETAMINOPHEN 500 MG PO TABS
1000.0000 mg | ORAL_TABLET | Freq: Once | ORAL | Status: AC
  Administered 2016-01-02: 1000 mg via ORAL
  Filled 2016-01-02: qty 2

## 2016-01-02 MED ORDER — SODIUM CHLORIDE 0.9 % IV BOLUS (SEPSIS)
1000.0000 mL | Freq: Once | INTRAVENOUS | Status: AC
  Administered 2016-01-02: 1000 mL via INTRAVENOUS

## 2016-01-02 NOTE — ED Notes (Signed)
Pt to XR at this time via stretcher

## 2016-01-02 NOTE — ED Notes (Signed)
patient here with ongoing left epigastric pain x 1 week, was admitted to [facility] and discharged to home on Tuesday. Patient states the pain is ongoing. Decreased appetite, constipation and just not feeling well

## 2016-01-02 NOTE — ED Notes (Signed)
Pt back from XR at this time.  

## 2016-01-02 NOTE — ED Provider Notes (Signed)
CSN: [id number]     Arrival date & time 01/02/16  1608 History   First MD Initiated Contact with Patient 01/02/16 2025     Chief Complaint  Patient presents with  . Abdominal Pain   Patient still in waiting room 2039  (Consider location/radiation/quality/duration/timing/severity/associated sxs/prior Treatment) HPI [name] is a 55 y.o. female with history of an STEMI, Takotsubo 7/16, recent medical admission earlier this month for left upper quadrant abdominal pain with associated respiratory failure. Patient reports she is followed up with her PCP after discharge on 7/9, referred to GI, but was unable to secure an appointment until the end of August. Patient came in today because her symptoms have not improved and she reports her pain is worsening. Pain is constant, sharp and located just below left breast. She reports she has not had a bowel movement since Thursday. She also reports urinary hesitancy and dysuria-which she also states is fairly normal for her. She reports intermittent nausea. She also reports decreased fluid intake. No fevers, chills, emesis. States she has had kidney stones in the past, but this is dissimilar. Took tramadol at home without relief. Nothing improves symptoms. No other modifying factors.  Past Medical History  Diagnosis Date  . PONV (postoperative nausea and vomiting)   . Left ureteral calculus   . History of palpitations     STRESS INDUCED  . Kidney stones   . Kidney cysts     "I think it was right"  . Hypercholesterolemia dx'd 12/2014  . Childhood asthma   . NSTEMI (non-ST elevated myocardial infarction) ([place]) 12/28/2014  . GERD (gastroesophageal reflux disease)   . Daily headache   . Arthritis     back (12/30/2014)  . Anxiety   . Depression   . Malignant melanoma of skin of eyebrow ([place]) 2001     RIGHT SUPRAORBITAL (RIGHT FOREHEAD AND UPPER EYELID ----  S/P MOHS PROCDURE W/ SLN BX----   NO RECURRENCE  . 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 Takotsubo syndrome     "Broken heart syndrome"   Past Surgical History  Procedure Laterality Date  . Kidney surgery  1966    BILATERAL URETER'S DILATATION  . Melanoma excision with sentinel lymph node biopsy  2001    moh's procedure/  RIGHT FOREHEAD AND UPPER EYEBROW  . Lumbar laminectomy/decompression microdiscectomy  05/18/2011    Procedure: LUMBAR LAMINECTOMY/DECOMPRESSION MICRODISCECTOMY;  Surgeon: Johnn Hai;  Location: [facility];  Service: Orthopedics;  Laterality: Right;  Decompression Lumbar 4-Lumbar 5  Right    (xray)   . Cystoscopy with retrograde pyelogram, ureteroscopy and stent placement Bilateral 03/19/2013    Procedure: CYSTOSCOPY WITH RETROGRADE PYELOGRAM, BILATERAL URETEROSCOPY AND STENT PLACEMENT LEFT URETER,BILATERAL STONE EXTRACTION , HOLMIUM LASER LEFT URETER;  Surgeon: Molli Hazard, MD;  Location: [facility];  Service: Urology;  Laterality: Bilateral;  . Right lateral parotidectomy w/ nerve dissection / right modified radical neck dissection sparing scm eleventh nerve and internal jugular vein  09-12-2006  DR DWIGHT BATES    DR DWIGHT BATES; "inside gland; lots of lymph nodes"  . Cardiovascular stress [facility]  [facility]21-2012  [facility]    NORMAL NUCLEAR STUDY/  NO ISCHEMIA/  EF 63%  . Cystoscopy w/ ureteral stent placement Left 03/26/2013    Procedure: CYSTOSCOPY WITH RETROGRADE PYELOGRAM ;  Surgeon: Molli Hazard, MD;  Location: [place];  Service: Urology;  Laterality: Left;  . Cystoscopy with stent placement Left 03/26/2013    Procedure: CYSTOSCOPY WITH STENT PLACEMENT;  Surgeon: Molli Hazard, MD;  Location: [facility];  Service: Urology;  Laterality: Left;  . Cardiac catheterization  2006 ([facility])  [place]    NORMAL  . Cardiac catheterization  12/30/2014  . Back surgery    . Breast biopsy  Bilateral early 2000's  . Diagnostic laparoscopy  04-12-2009  . Cardiac catheterization N/A 12/30/2014    Procedure: Left Heart Cath and Coronary Angiography;  Surgeon: Wellington Hampshire, MD;  Location: [place] [facility];  Service: Cardiovascular;  Laterality: N/A;   Family History  Problem Relation Age of Onset  . Hypertension Mother   . COPD Mother   . Cancer Mother     breast  . Dementia Mother   . Heart disease Father   . Cancer Father     Colorectal  . Hyperlipidemia Father   . Hypertension Father    Social History  Substance Use Topics  . Smoking status: Never Smoker   . Smokeless tobacco: Never Used  . Alcohol Use: Yes     Comment: 12/30/2014 "might have a beer or glass of wine maybe once/month"   OB History    No data available     Review of Systems A 10 point review of systems was completed and was negative except for pertinent positives and negatives as mentioned in the history of present illness     Allergies  Ativan and Cephalexin  Home Medications   Prior to Admission medications   Medication Sig Start Date End Date Taking? Authorizing Provider  aspirin 81 MG chewable tablet Chew 81 mg by mouth daily.   Yes Historical Provider, MD  atorvastatin (LIPITOR) 20 MG tablet TAKE ONE TABLET BY MOUTH ONCE DAILY 12/24/15  Yes Bhavinkumar Bhagat, PA  Calcium Carb-Cholecalciferol (CALCIUM 600 + D) 600-200 MG-UNIT TABS Take 2 tablets by mouth daily.    Yes Historical Provider, MD  carvedilol (COREG) 3.125 MG tablet TAKE ONE TABLET BY MOUTH TWICE DAILY WITH  A  MEAL 12/24/15  Yes Bhavinkumar Bhagat, PA  gabapentin (NEURONTIN) 300 MG capsule Take 300 mg by mouth 3 (three) times daily.  02/07/15  Yes Historical Provider, MD  ketotifen (THERA TEARS ALLERGY) 0.025 % ophthalmic solution Apply 1 drop to eye daily as needed (for dry eye relief).   Yes Historical Provider, MD  lisinopril (PRINIVIL,ZESTRIL) 2.5 MG tablet TAKE ONE TABLET BY MOUTH ONCE DAILY 12/24/15  Yes Josue Hector, MD   magnesium oxide (MAG-OX) 400 MG tablet Take 400 mg by mouth daily.   Yes Historical Provider, MD  Multiple Vitamin (MULTIVITAMIN) tablet Take 1 tablet by mouth daily.   Yes Historical Provider, MD  nitroGLYCERIN (NITROSTAT) 0.4 MG SL tablet Place 1 tablet (0.4 mg total) under the tongue every 5 (five) minutes as needed for chest pain. 02/11/15  Yes Josue Hector, MD  pantoprazole (PROTONIX) 40 MG tablet Take 40 mg by mouth 2 (two) times daily.  04/06/15  Yes Historical Provider, MD  polyethylene glycol (MIRALAX / GLYCOLAX) packet Take 17 g by mouth 2 (two) times daily.   Yes Historical Provider, MD  traMADol (ULTRAM) 50 MG tablet Take by mouth every 6 (six) hours as needed.   Yes Historical Provider, MD  polyethylene glycol (MIRALAX) 0.34 gm/ml SOLN Take 50 mLs (17 g total) by mouth every 12 (twelve) hours. Patient not taking:  Reported on 01/02/2016 12/28/15   Orvan Falconer, MD   BP 111/67 mmHg  Pulse 74  Temp(Src) 98.2 F (36.8 C) (Oral)  Resp 18  Ht 5\' 4"  (1.626 m)  Wt 85.276 kg  BMI 32.25 kg/m2  SpO2 99%  LMP 05/18/2011 Physical Exam  Constitutional: She is oriented to person, place, and time. She appears well-developed and well-nourished.  Obese white female  HENT:  Head: Normocephalic and atraumatic.  Mouth/Throat: Oropharynx is clear and moist.  Eyes: Conjunctivae are normal. Pupils are equal, round, and reactive to light. Right eye exhibits no discharge. Left eye exhibits no discharge. No scleral icterus.  Neck: Neck supple.  Cardiovascular: Normal rate, regular rhythm and normal heart sounds.   Pulmonary/Chest: Effort normal and breath sounds normal. No respiratory distress. She has no wheezes. She has no rales.  Abdominal: Soft. There is tenderness.  Mild to moderate tenderness to palpation and left upper quadrant and inferior left ribs. No lesions or deformities, crepitus. No peritoneal signs. However, abdomen is slightly firm.   Musculoskeletal: She exhibits no tenderness.   Neurological: She is alert and oriented to person, place, and time.  Cranial Nerves II-XII grossly intact  Skin: Skin is warm and dry. No rash noted.  Psychiatric: She has a normal mood and affect.  Nursing note and vitals reviewed.   ED Course  Procedures (including critical care time) Labs Review Labs Reviewed  COMPREHENSIVE METABOLIC PANEL - Abnormal; Notable for the following:    Sodium 132 (*)    Chloride 99 (*)    BUN 28 (*)    Creatinine, Ser 1.18 (*)    AST 51 (*)    GFR calc non Af Amer 50 (*)    GFR calc Af Amer 58 (*)    All other components within normal limits  URINALYSIS, ROUTINE W REFLEX MICROSCOPIC (NOT AT [facility]) - Abnormal; Notable for the following:    APPearance CLOUDY (*)    Hgb urine dipstick SMALL (*)    All other components within normal limits  URINE MICROSCOPIC-ADD ON - Abnormal; Notable for the following:    Squamous Epithelial / LPF 6-30 (*)    All other components within normal limits  LIPASE, BLOOD  CBC  I-STAT TROPOININ, ED    Imaging Review Dg Abd Acute W/chest  01/03/2016  CLINICAL DATA:  Abdominal pain EXAM: DG ABDOMEN ACUTE W/ 1V CHEST COMPARISON:  12/27/2015 CT abdomen and pelvis FINDINGS: Formed stool throughout much of the colon. There is oral contrast in the colon from unknown exam. Nonobstructive pattern. Negative for pneumoperitoneum. No concerning intra-abdominal mass effect or calcification. Normal heart size and mediastinal contours. No acute infiltrate or edema. No effusion or pneumothorax. No acute osseous findings. IMPRESSION: 1. Large volume stool, correlate for constipation. No bowel obstruction or rectal impaction. 2. Negative chest. Electronically Signed   By: Monte Fantasia M.D.   On: 01/03/2016 00:17   I have personally reviewed and evaluated these images and lab results as part of my medical decision-making.   EKG Interpretation None     Filed Vitals:   01/02/16 2330 01/02/16 2355  BP:  111/67  Pulse: 63 74  Temp:     Resp:  18    MDM  Presents for evaluation of left upper quadrant abdominal pain. Recent admission earlier this month with extensive workup did not elucidate source of patient's upper abdominal discomfort. She has an appointment with her GI doctor in August. Physical exam was reassuring, abdomen is mildly firm likely secondary  to constipation. No peritoneal signs or rigid abdomen. Labs are reassuring. Plain film of abdomen shows stool burden with no obstruction or rectal impaction. Encouraged patient to follow-up with her GI doctor for reevaluation, increased MiraLAX as tolerated until bowel movement, aggressive oral rehydration. Return precautions discussed. She and husband at bedside verbalizes understanding, agrees with plan and subsequent discharge. Final diagnoses:  Abdominal discomfort  Constipation, unspecified constipation type        Comer Locket, PA-C 01/03/16 1926  Fredia Sorrow, MD 01/04/16 [phone]

## 2016-01-02 NOTE — ED Notes (Signed)
Spoke with XR regarding ordered imaging. XR says techs are en route at this time to get pt.

## 2016-01-03 MED ORDER — IOPAMIDOL (ISOVUE-300) INJECTION 61%
INTRAVENOUS | Status: AC
  Filled 2016-01-03: qty 100

## 2016-01-03 NOTE — ED Notes (Signed)
Pt d/c home with husband. Pt ambulates without assistance.

## 2016-01-03 NOTE — Discharge Instructions (Signed)
There does not appear to be an emergent cause for your symptoms at this time. Your labs, x-ray were reassuring. Continue taking the MiraLAX as we discussed. It is important to stay well hydrated and drink lots of water and Gatorade as we discussed. Follow up with your gastroenterologist or use the referral to follow up with gastroenterology. Return to ED for new or worsening symptoms as we discussed.  Constipation, Adult Constipation is when a person has fewer than three bowel movements a week, has difficulty having a bowel movement, or has stools that are dry, hard, or larger than normal. As people grow older, constipation is more common. A low-fiber diet, not taking in enough fluids, and taking certain medicines may make constipation worse.  CAUSES   Certain medicines, such as antidepressants, pain medicine, iron supplements, antacids, and water pills.   Certain diseases, such as diabetes, irritable bowel syndrome (IBS), thyroid disease, or depression.   Not drinking enough water.   Not eating enough fiber-rich foods.   Stress or travel.   Lack of physical activity or exercise.   Ignoring the urge to have a bowel movement.   Using laxatives too much.  SIGNS AND SYMPTOMS   Having fewer than three bowel movements a week.   Straining to have a bowel movement.   Having stools that are hard, dry, or larger than normal.   Feeling full or bloated.   Pain in the lower abdomen.   Not feeling relief after having a bowel movement.  DIAGNOSIS  Your health care provider will take a medical history and perform a physical exam. Further testing may be done for severe constipation. Some tests may include:  A barium enema X-ray to examine your rectum, colon, and, sometimes, your small intestine.   A sigmoidoscopy to examine your lower colon.   A colonoscopy to examine your entire colon. TREATMENT  Treatment will depend on the severity of your constipation and what is  causing it. Some dietary treatments include drinking more fluids and eating more fiber-rich foods. Lifestyle treatments may include regular exercise. If these diet and lifestyle recommendations do not help, your health care provider may recommend taking over-the-counter laxative medicines to help you have bowel movements. Prescription medicines may be prescribed if over-the-counter medicines do not work.  HOME CARE INSTRUCTIONS   Eat foods that have a lot of fiber, such as fruits, vegetables, whole grains, and beans.  Limit foods high in fat and processed sugars, such as french fries, hamburgers, cookies, candies, and soda.   A fiber supplement may be added to your diet if you cannot get enough fiber from foods.   Drink enough fluids to keep your urine clear or pale yellow.   Exercise regularly or as directed by your health care provider.   Go to the restroom when you have the urge to go. Do not hold it.   Only take over-the-counter or prescription medicines as directed by your health care provider. Do not take other medicines for constipation without talking to your health care provider first.  [place] IF:   You have bright red blood in your stool.   Your constipation lasts for more than 4 days or gets worse.   You have abdominal or rectal pain.   You have thin, pencil-like stools.   You have unexplained weight loss. MAKE SURE YOU:   Understand these instructions.  Will watch your condition.  Will get help right away if you are not doing well or get worse.  This information is not intended to replace advice given to you by your health care provider. Make sure you discuss any questions you have with your health care provider.   Document Released: 03/03/2004 Document Revised: 06/26/2014 Document Reviewed: 03/17/2013 Elsevier Interactive Patient Education [employer].

## 2016-01-03 NOTE — ED Notes (Signed)
Provider at bedside discussing test results at this time.

## 2016-01-03 NOTE — ED Notes (Signed)
Pt ambulated independently to the restroom.

## 2016-01-20 ENCOUNTER — Emergency Department (HOSPITAL_COMMUNITY)
Admission: EM | Admit: 2016-01-20 | Discharge: 2016-01-20 | Disposition: A | Discharge status: Home / Self Care | Payer: BLUE CROSS/BLUE SHIELD | Attending: Emergency Medicine | Admitting: Emergency Medicine

## 2016-01-20 ENCOUNTER — Emergency Department (HOSPITAL_COMMUNITY): Payer: BLUE CROSS/BLUE SHIELD

## 2016-01-20 ENCOUNTER — Encounter (HOSPITAL_COMMUNITY): Payer: Self-pay | Admitting: Emergency Medicine

## 2016-01-20 DIAGNOSIS — Z955 Presence of coronary angioplasty implant and graft: Secondary | ICD-10-CM | POA: Diagnosis not present

## 2016-01-20 DIAGNOSIS — I10 Essential (primary) hypertension: Secondary | ICD-10-CM | POA: Insufficient documentation

## 2016-01-20 DIAGNOSIS — J45909 Unspecified asthma, uncomplicated: Secondary | ICD-10-CM | POA: Diagnosis not present

## 2016-01-20 DIAGNOSIS — Z79899 Other long term (current) drug therapy: Secondary | ICD-10-CM | POA: Diagnosis not present

## 2016-01-20 DIAGNOSIS — Z7982 Long term (current) use of aspirin: Secondary | ICD-10-CM | POA: Diagnosis not present

## 2016-01-20 DIAGNOSIS — R072 Precordial pain: Secondary | ICD-10-CM | POA: Diagnosis present

## 2016-01-20 DIAGNOSIS — I252 Old myocardial infarction: Secondary | ICD-10-CM | POA: Insufficient documentation

## 2016-01-20 DIAGNOSIS — R0789 Other chest pain: Secondary | ICD-10-CM | POA: Insufficient documentation

## 2016-01-20 LAB — COMPREHENSIVE METABOLIC PANEL
ALBUMIN: 3.6 g/dL (ref 3.5–5.0)
ALK PHOS: 53 U/L (ref 38–126)
ALT: 20 U/L (ref 14–54)
ANION GAP: 7 (ref 5–15)
AST: 21 U/L (ref 15–41)
BILIRUBIN TOTAL: 0.5 mg/dL (ref 0.3–1.2)
BUN: 20 mg/dL (ref 6–20)
CALCIUM: 8.8 mg/dL — AB (ref 8.9–10.3)
CO2: 27 mmol/L (ref 22–32)
Chloride: 106 mmol/L (ref 101–111)
Creatinine, Ser: 0.79 mg/dL (ref 0.44–1.00)
GFR calc Af Amer: >60 mL/min (ref >60)
GFR calc non Af Amer: >60 mL/min (ref >60)
GLUCOSE: 99 mg/dL (ref 65–99)
Potassium: 4.6 mmol/L (ref 3.5–5.1)
Sodium: 140 mmol/L (ref 135–145)
TOTAL PROTEIN: 5.8 g/dL — AB (ref 6.5–8.1)

## 2016-01-20 LAB — CBC WITH DIFFERENTIAL/PLATELET
BASOS PCT: 0 %
Basophils Absolute: 0 10*3/uL (ref 0.0–0.1)
Eosinophils Absolute: 0.1 10*3/uL (ref 0.0–0.7)
Eosinophils Relative: 2 %
HEMATOCRIT: 34.1 % — AB (ref 36.0–46.0)
HEMOGLOBIN: 11.4 g/dL — AB (ref 12.0–15.0)
Lymphocytes Relative: 23 %
Lymphs Abs: 1.1 10*3/uL (ref 0.7–4.0)
MCH: 30.8 pg (ref 26.0–34.0)
MCHC: 33.4 g/dL (ref 30.0–36.0)
MCV: 92.2 fL (ref 78.0–100.0)
Monocytes Absolute: 0.3 10*3/uL (ref 0.1–1.0)
Monocytes Relative: 6 %
NEUTROS ABS: 3.2 10*3/uL (ref 1.7–7.7)
NEUTROS PCT: 69 %
Platelets: 120 10*3/uL — ABNORMAL LOW (ref 150–400)
RBC: 3.7 MIL/uL — ABNORMAL LOW (ref 3.87–5.11)
RDW: 12.5 % (ref 11.5–15.5)
WBC: 4.7 10*3/uL (ref 4.0–10.5)

## 2016-01-20 LAB — I-STAT TROPONIN, ED
TROPONIN I, POC: 0 ng/mL (ref 0.00–0.08)
Troponin i, poc: 0 ng/mL (ref 0.00–0.08)

## 2016-01-20 MED ORDER — FENTANYL CITRATE (PF) 100 MCG/2ML IJ SOLN
50.0000 ug | Freq: Once | INTRAMUSCULAR | Status: AC
  Administered 2016-01-20: 50 ug via INTRAVENOUS
  Filled 2016-01-20: qty 2

## 2016-01-20 NOTE — ED Triage Notes (Signed)
Patient presents today states she was at the Doctor office with husband and started having sudden onset of chest pain. Patient took 324 ASA and 3 nitro with min relief. Patient has had a MI in past states pain felt worst. Patient states she was nasuea but denies any SOB. Patient Bp 108/63 after 500 Normal saline with EMS. Patient rates pain 6/10.

## 2016-01-20 NOTE — ED Provider Notes (Signed)
[facility] DEPT Provider Note   CSN: [id number] Arrival date & time: 01/20/16  1402  First Provider Contact:  None       History   Chief Complaint Chief Complaint  Patient presents with  . Chest Pain    HPI [name] is a 55 y.o. female.  The history is provided by the patient and the spouse. No language interpreter was used.  Chest Pain   This is a recurrent problem. The current episode started 3 to 5 hours ago. The problem occurs every several days. The problem has been gradually improving. The pain is associated with rest. The pain is present in the substernal region. The pain is severe. The quality of the pain is described as sharp and vice-like. The pain does not radiate. Episode Length: a few hours  Exacerbated by: nothin. Associated symptoms include back pain, diaphoresis (mild), nausea (mild) and shortness of breath (mild). Pertinent negatives include no cough, no exertional chest pressure, no fever, no hemoptysis, no irregular heartbeat, no leg pain (only occasional cramps), no lower extremity edema, no near-syncope, no palpitations, no sputum production, no syncope, no vomiting and no weakness. She has tried nitroglycerin and rest for the symptoms. The treatment provided mild relief. Risk factors include being elderly and post-menopausal.  Her past medical history is significant for hyperlipidemia and MI (takatsubo's).  Pertinent negatives for past medical history include no aortic aneurysm, no aortic dissection, no CAD, no congenital heart disease, no connective tissue disease, no COPD, no CHF, no diabetes, no hypertension, no Marfan's syndrome, no pacemaker, no PE, no recent injury and no strokes.  Her family medical history is significant for heart disease, hyperlipidemia, hypertension and PE.  Pertinent negatives for family medical history include: no connective tissue disease, no Marfan's syndrome and no early MI.  Procedure history is positive for cardiac  catheterization and echocardiogram.    Past Medical History:  Diagnosis Date  . Anxiety   . Arthritis    back (12/30/2014)  . Childhood asthma   . Daily headache   . Depression   . GERD (gastroesophageal reflux disease)   . History of palpitations    STRESS INDUCED  . Hypercholesterolemia dx'd 12/2014  . Kidney cysts    "I think it was right"  . Kidney stones   . Left ureteral calculus   . 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 NSTEMI (non-ST elevated myocardial infarction) ([place]) 12/28/2014  . PONV (postoperative nausea and vomiting)   . Takotsubo syndrome    "Broken heart syndrome"    Patient Active Problem List   Diagnosis Date Noted  . Constipation 12/29/2015  . Respiratory failure ([place]) 12/26/2015  . Hyperlipidemia 12/26/2015  . Essential (primary) hypertension 12/26/2015  . Abdominal pain 12/26/2015  . Urinary tract infection, site not specified 03/27/2015  . Cardiomyopathy ([place])   . ICM- EF 30-35% at cath 12/30/14-improved to 45% by echo 01/06/15 01/06/2015  . Orthostatic hypotension 01/06/2015  . Near syncope 01/05/2015  . IBS (irritable bowel syndrome) 01/05/2015  . Biliary dyskinesia 01/05/2015  . Recent NSTEMI (Taktosubo event 12/30/14) 12/29/2014  . GERD (gastroesophageal reflux disease) 03/21/2012    Past Surgical History:  Procedure  Laterality Date  . BACK SURGERY    . BREAST BIOPSY Bilateral early 2000's  . CARDIAC CATHETERIZATION  2006 (APPROX)  [place]   NORMAL  . CARDIAC CATHETERIZATION  12/30/2014  . CARDIAC CATHETERIZATION N/A 12/30/2014   Procedure: Left Heart Cath and Coronary Angiography;  Surgeon: Wellington Hampshire, MD;  Location: [facility];  Service:  Cardiovascular;  Laterality: N/A;  . CARDIOVASCULAR STRESS TEST  03-21-2012  DR Johnsie Cancel   NORMAL NUCLEAR STUDY/  NO ISCHEMIA/  EF 63%  . CYSTOSCOPY W/ URETERAL STENT PLACEMENT Left 03/26/2013   Procedure: CYSTOSCOPY WITH RETROGRADE PYELOGRAM ;  Surgeon: Molli Hazard, MD;  Location: [facility];  Service: Urology;  Laterality: Left;  . CYSTOSCOPY WITH RETROGRADE PYELOGRAM, URETEROSCOPY AND STENT PLACEMENT Bilateral 03/19/2013   Procedure: CYSTOSCOPY WITH RETROGRADE PYELOGRAM, BILATERAL URETEROSCOPY AND STENT PLACEMENT LEFT URETER,BILATERAL STONE EXTRACTION , HOLMIUM LASER LEFT URETER;  Surgeon: Molli Hazard, MD;  Location: [facility];  Service: Urology;  Laterality: Bilateral;  . CYSTOSCOPY WITH STENT PLACEMENT Left 03/26/2013   Procedure: CYSTOSCOPY WITH STENT PLACEMENT;  Surgeon: Molli Hazard, MD;  Location: [facility];  Service: Urology;  Laterality: Left;  . DIAGNOSTIC LAPAROSCOPY  04-12-2009  . KIDNEY SURGERY  1966   BILATERAL URETER'S DILATATION  . LUMBAR LAMINECTOMY/DECOMPRESSION MICRODISCECTOMY  05/18/2011   Procedure: LUMBAR LAMINECTOMY/DECOMPRESSION MICRODISCECTOMY;  Surgeon: Johnn Hai;  Location: [facility];  Service: Orthopedics;  Laterality: Right;  Decompression Lumbar 4-Lumbar 5  Right    (xray)   . MELANOMA EXCISION WITH SENTINEL LYMPH NODE BIOPSY  2001   moh's procedure/  RIGHT FOREHEAD AND UPPER EYEBROW  . RIGHT LATERAL PAROTIDECTOMY W/ NERVE DISSECTION / RIGHT MODIFIED RADICAL NECK DISSECTION SPARING SCM ELEVENTH NERVE AND INTERNAL JUGULAR VEIN  09-12-2006  DR DWIGHT BATES   DR DWIGHT BATES; "inside gland; lots of lymph nodes"    OB History    No data available       Home Medications    Prior to Admission medications   Medication Sig Start Date End Date Taking? Authorizing Provider  aspirin 81 MG chewable tablet Chew 81 mg by mouth daily.   Yes Historical Provider, MD  atorvastatin (LIPITOR) 20 MG tablet TAKE ONE  TABLET BY MOUTH ONCE DAILY Patient taking differently: TAKE 20 MG BY MOUTH ONCE DAILY 12/24/15  Yes Bhavinkumar Bhagat, PA  Calcium Carb-Cholecalciferol (CALCIUM 600 + D) 600-200 MG-UNIT TABS Take 2 tablets by mouth daily.    Yes Historical Provider, MD  carvedilol (COREG) 3.125 MG tablet TAKE ONE TABLET BY MOUTH TWICE DAILY WITH  A  MEAL Patient taking differently: TAKE 3.125 MG BY MOUTH TWICE DAILY WITH  A  MEAL 12/24/15  Yes Bhavinkumar Bhagat, PA  gabapentin (NEURONTIN) 300 MG capsule Take 300 mg by mouth 3 (three) times daily.  02/07/15  Yes Historical Provider, MD  ketotifen (THERA TEARS ALLERGY) 0.025 % ophthalmic solution Apply 1 drop to eye daily as needed (for dry eye relief).   Yes Historical Provider, MD  lisinopril (PRINIVIL,ZESTRIL) 2.5 MG tablet TAKE ONE TABLET BY MOUTH ONCE DAILY Patient taking differently: TAKE 2.5 MG BY MOUTH ONCE DAILY 12/24/15  Yes Josue Hector, MD  lubiprostone (AMITIZA) 24 MCG capsule Take 24 mcg by mouth 2 (two) times daily with a meal.   Yes Historical Provider, MD  magnesium oxide (MAG-OX) 400 MG tablet Take 400 mg by mouth daily.   Yes Historical Provider, MD  Multiple Vitamin (MULTIVITAMIN) tablet Take 1 tablet  by mouth daily.   Yes Historical Provider, MD  nitroGLYCERIN (NITROSTAT) 0.4 MG SL tablet Place 1 tablet (0.4 mg total) under the tongue every 5 (five) minutes as needed for chest pain. 02/11/15  Yes Josue Hector, MD  pantoprazole (PROTONIX) 40 MG tablet Take 40 mg by mouth 2 (two) times daily.  04/06/15  Yes Historical Provider, MD  traMADol (ULTRAM) 50 MG tablet Take 50 mg by mouth every 6 (six) hours as needed for moderate pain.    Yes Historical Provider, MD  polyethylene glycol (MIRALAX) 0.34 gm/ml SOLN Take 50 mLs (17 g total) by mouth every 12 (twelve) hours. Patient not taking: Reported on 01/02/2016 12/28/15   Orvan Falconer, MD    Family History Family History  Problem Relation Age of Onset  . Hypertension Mother   . COPD Mother   . Cancer  Mother     breast  . Dementia Mother   . Heart disease Father   . Cancer Father     Colorectal  . Hyperlipidemia Father   . Hypertension Father     Social History Social History  Substance Use Topics  . Smoking status: Never Smoker  . Smokeless tobacco: Never Used  . Alcohol use Yes     Comment: 12/30/2014 "might have a beer or glass of wine maybe once/month"     Allergies   Ativan [lorazepam] and Cephalexin   Review of Systems Review of Systems  Constitutional: Positive for diaphoresis (mild). Negative for fever.  HENT: Negative.   Eyes: Negative for discharge.  Respiratory: Positive for shortness of breath (mild). Negative for cough, hemoptysis and sputum production.   Cardiovascular: Positive for chest pain. Negative for palpitations, syncope and near-syncope.  Gastrointestinal: Positive for nausea (mild). Negative for vomiting.  Genitourinary: Negative.   Musculoskeletal: Positive for back pain.  Skin: Negative for pallor.  Neurological: Negative for syncope and weakness.  Psychiatric/Behavioral: Negative for agitation and confusion.     Physical Exam Updated Vital Signs BP 99/56   Pulse 67   Temp 98.7 F (37.1 C) (Oral)   Resp 12   LMP 05/18/2011   SpO2 93%   Physical Exam  Constitutional: She is oriented to person, place, and time. She appears well-developed and well-nourished. She appears distressed (mild to mod).  Appears anxious  HENT:  Head: Normocephalic and atraumatic.  Eyes: Conjunctivae are normal. Right eye exhibits no discharge. Left eye exhibits no discharge.  Neck: Normal range of motion. Neck supple. No tracheal deviation present.  Cardiovascular: Normal rate, regular rhythm, normal heart sounds and intact distal pulses.  Exam reveals no gallop and no friction rub.   No murmur heard. Pulmonary/Chest: Effort normal and breath sounds normal. No stridor. No respiratory distress. She has no wheezes. She has no rales.  Abdominal: Soft. Bowel  sounds are normal. She exhibits no distension. There is no tenderness. There is no rebound and no guarding.  Musculoskeletal: She exhibits no edema or tenderness.  No calf tenderness  Neurological: She is alert and oriented to person, place, and time.  Skin: Skin is warm and dry. Capillary refill takes less than 2 seconds. She is not diaphoretic. No pallor.  Psychiatric: Her behavior is normal. Thought content normal.  Appears anxious  Nursing note and vitals reviewed.    ED Treatments / Results  Labs (all labs ordered are listed, but only abnormal results are displayed) Labs Reviewed  CBC WITH DIFFERENTIAL/PLATELET - Abnormal; Notable for the following:       Result Value  RBC 3.70 (*)    Hemoglobin 11.4 (*)    HCT 34.1 (*)    Platelets 120 (*)    All other components within normal limits  COMPREHENSIVE METABOLIC PANEL - Abnormal; Notable for the following:    Calcium 8.8 (*)    Total Protein 5.8 (*)    All other components within normal limits  I-STAT TROPOININ, ED    EKG  EKG Interpretation  Date/Time:  Thursday January 20 2016 14:03:41 EDT Ventricular Rate:  71 PR Interval:    QRS Duration: 94 QT Interval:  411 QTC Calculation: 447 R Axis:   -50 Text Interpretation:  Sinus rhythm Left anterior fascicular block Low voltage, precordial leads Nonspecific T abnormalities, anterior leads T wave abnormality present on prior ECG in 7/16 and 08/2006 Confirmed by [facility] MD, [place] ([id number]) on 01/20/2016 2:12:37 PM       Radiology Dg Chest 2 View  Result Date: 01/20/2016 CLINICAL DATA:  Mid chest pain since 12:30 p.m. today. Dizziness and shortness of breath. EXAM: CHEST  2 VIEW COMPARISON:  01/02/2016. FINDINGS: Normal sized heart. Clear lungs. Normal vascularity. Thoracic spine degenerative changes. Left breast surgical clip. IMPRESSION: No acute abnormality. Electronically Signed   By: Claudie Revering M.D.   On: 01/20/2016 14:50    Procedures Procedures (including critical  care time)  Medications Ordered in ED Medications  fentaNYL (SUBLIMAZE) injection 50 mcg (50 mcg Intravenous Given 01/20/16 1656)     Initial Impression / Assessment and Plan / ED Course  I have reviewed the triage vital signs and the nursing notes.  Pertinent labs & imaging results that were available during my care of the patient were reviewed by me and considered in my medical decision making (see chart for details).  Clinical Course   55 year old female with history of Takatsubo's MI about a year ago and hyperlipidemia presents to the emergency department because of sudden onset chest pain which started a few hours ago.  Patient is not PERC negative but has a well score of 0.  Patient also had a VQ scan about a month ago which was unremarkable.  Patient had left heart cath last year which showed patent coronaries.  EKG was unchanged from priors and delta troponin was negative.  Chest x-ray did not show any widened and mediastinum and patient had equal pulses in all 4 extremities, making aortic dissection unlikely.  Patient does have a history of radiation for her prior parotid gland malignancy.  As results patient may now have esophageal dysfunction causing acute painful episodes.  Patient also had some relief with fentanyl which could support a diagnosis of musculoskeletal pain causing her discomfort today.  Discussed with patient findings noted above and attempted to reassure that further workup could safely be done outpatient.  Would attempt to discuss event with the GI doctor to further rule out any pain from possible esophageal spasm.  Patient and vital signs stable at discharge.  Patient states understanding and agreement with plan.  Usual and customary return precautions for chest pain discussed.   Final Clinical Impressions(s) / ED Diagnoses   Final diagnoses:  Atypical chest pain    New Prescriptions New Prescriptions   No medications on file     Darlina Rumpf, MD 01/24/16  [id number]    Darlina Rumpf, MD 01/24/16 [id number]    Forde Dandy, MD 01/24/16 1217

## 2016-01-20 NOTE — ED Notes (Signed)
Patient husband given graham crackers and peanut butter and coffee.

## 2016-01-21 NOTE — ED Provider Notes (Signed)
I saw and evaluated the patient, reviewed the resident's note and I agree with the findings and plan.   EKG Interpretation  Date/Time:  Thursday January 20 2016 14:03:41 EDT Ventricular Rate:  71 PR Interval:    QRS Duration: 94 QT Interval:  411 QTC Calculation: 447 R Axis:   -50 Text Interpretation:  Sinus rhythm Left anterior fascicular block Low voltage, precordial leads Nonspecific T abnormalities, anterior leads T wave abnormality present on prior ECG in 7/16 and 08/2006 Confirmed by [facility] MD, [place] ([id number]) on 01/20/2016 2:12:20 PM      55 year old female who presents with chest pain. She has a history of Takastubo's cardiomyopathy with initial EF 35% now resolved to baseline per her cardiologist. With LHC in 12/2014 showing normal coronaries. Has had issues with intermittent atypical chest pain and LUQ abd pain, and work-up including recent CT abd/pelvis and V/Q scan within one month that has been unremarkable. States she drank a shake on the way to the doctor's office today for her husband, and while waiting in the waiting room developed chest pressure and tightness with LUQ abd pain. Not fully similar to prior episodes. Mild dyspnea, no syncope or near syncope. No pleuritic pain, and Pain not worse with exertion or movement. On presentation, she is nontoxic in no acute distress with non-concerning vital signs. Minimal symptoms on my evaluation. EKG with T-wave inversions in the anterolateral leads, which are unchanged from prior EKG. Serial EKG and troponins are negative, and in the setting of her recent left heart catheterization one year ago with normal coronaries, this seems unlikely to be ACS. No evidence of fluid overload. Chest x-ray is clear. Remainder basic blood work is unremarkable. Recent workup for PE was negative within this last month and I do not feel that she requires similar workup here today. Her abdomen is benign. No history of aneurysm on abdomen or chest on imaging over  the past 6 months. History is not typical or concerning for that of dissection. At this time, unclear of etiology of atypical chest pain. Not concerned about emergent intrathoracic or cardiopulmonary causes at this time. Strict return and follow-up instructions reviewed. She expressed understanding of all discharge instructions and felt comfortable with the plan of care.    Forde Dandy, MD 01/21/16 [phone]

## 2016-01-27 ENCOUNTER — Ambulatory Visit (INDEPENDENT_AMBULATORY_CARE_PROVIDER_SITE_OTHER): Payer: BLUE CROSS/BLUE SHIELD | Admitting: Family

## 2016-01-27 ENCOUNTER — Encounter: Payer: Self-pay | Admitting: Family

## 2016-01-27 VITALS — BP 112/69 | HR 75 | Temp 97.5°F | Ht 64.0 in | Wt 199.0 lb

## 2016-01-27 DIAGNOSIS — J209 Acute bronchitis, unspecified: Secondary | ICD-10-CM

## 2016-01-27 MED ORDER — ALBUTEROL SULFATE HFA 108 (90 BASE) MCG/ACT IN AERS: 2.0000 | INHALATION_SPRAY | Freq: Four times a day (QID) | RESPIRATORY_TRACT | 2 refills | Status: DC | PRN

## 2016-01-27 MED ORDER — PREDNISONE 10 MG (21) PO TBPK: ORAL_TABLET | ORAL | 0 refills | Status: DC

## 2016-01-27 NOTE — Progress Notes (Signed)
   Subjective:    Patient ID: [name], female    DOB: 1961-05-16, 55 y.o.   MRN: [id number]  Wheezing   This is a new problem. The current episode started yesterday. The problem occurs intermittently. The problem has been unchanged. Associated symptoms include shortness of breath and a sore throat. Pertinent negatives include no chills, coughing, diarrhea, fever, headaches, rhinorrhea, sputum production or vomiting. She has tried rest for the symptoms. The treatment provided no relief. There is no history of asthma, COPD or DVT.      Review of Systems  Constitutional: Negative for chills and fever.  HENT: Positive for sore throat. Negative for rhinorrhea.   Respiratory: Positive for shortness of breath and wheezing. Negative for cough and sputum production.   Gastrointestinal: Negative for diarrhea and vomiting.  Neurological: Negative for headaches.  All other systems reviewed and are negative.      Objective:   Physical Exam  Constitutional: She is oriented to person, place, and time. She appears well-developed and well-nourished. No distress.  HENT:  Head: Normocephalic and atraumatic.  Right Ear: External ear normal.  Mouth/Throat: Oropharynx is clear and moist.  Eyes: Pupils are equal, round, and reactive to light.  Neck: Normal range of motion. Neck supple. No thyromegaly present.  Cardiovascular: Normal rate, regular rhythm, normal heart sounds and intact distal pulses.   No murmur heard. Pulmonary/Chest: Effort normal. No respiratory distress. She has wheezes.  Abdominal: Soft. Bowel sounds are normal. She exhibits no distension. There is no tenderness.  Musculoskeletal: Normal range of motion. She exhibits no edema or tenderness.  Neurological: She is alert and oriented to person, place, and time. She has normal reflexes. No cranial nerve deficit.  Skin: Skin is warm and dry.  Psychiatric: She has a normal mood and affect. Her behavior is normal. Judgment and  thought content normal.  Vitals reviewed.     BP 112/69   Pulse 75   Temp 97.5 F (36.4 C) (Oral)   Ht 5\' 4"  (1.626 m)   Wt 199 lb (90.3 kg)   LMP 05/18/2011   SpO2 95%   BMI 34.16 kg/m      Assessment & Plan:  1. Acute bronchitis, unspecified organism -- Take meds as prescribed - Use a cool mist humidifier  -Use saline nose sprays frequently -Saline irrigations of the nose can be very helpful if done frequently.  * 4X daily for 1 week*  * Use of a nettie pot can be helpful with this. Follow directions with this* -Force fluids -For any cough or congestion  Use plain Mucinex- regular strength or max strength is fine   * Children- consult with Pharmacist for dosing -For fever or aces or pains- take tylenol or ibuprofen appropriate for age and weight.  * for fevers greater than 101 orally you may alternate ibuprofen and tylenol every  3 hours. -Throat lozenges if help - predniSONE (STERAPRED UNI-PAK 21 TAB) 10 MG (21) TBPK tablet; Use as directed  Dispense: 21 tablet; Refill: 0 - albuterol (PROVENTIL HFA;VENTOLIN HFA) 108 (90 Base) MCG/ACT inhaler; Inhale 2 puffs into the lungs every 6 (six) hours as needed for wheezing or shortness of breath.  Dispense: 1 Inhaler; Refill: [place], FNP

## 2016-01-27 NOTE — Patient Instructions (Addendum)
Acute Bronchitis Bronchitis is inflammation of the airways that extend from the windpipe into the lungs (bronchi). The inflammation often causes mucus to develop. This leads to a cough, which is the most common symptom of bronchitis.  In acute bronchitis, the condition usually develops suddenly and goes away over time, usually in a couple weeks. Smoking, allergies, and asthma can make bronchitis worse. Repeated episodes of bronchitis may cause further lung problems.  CAUSES Acute bronchitis is most often caused by the same virus that causes a cold. The virus can spread from person to person (contagious) through coughing, sneezing, and touching contaminated objects. SIGNS AND SYMPTOMS   Cough.   Fever.   Coughing up mucus.   Body aches.   Chest congestion.   Chills.   Shortness of breath.   Sore throat.  DIAGNOSIS  Acute bronchitis is usually diagnosed through a physical exam. Your health care provider will also ask you questions about your medical history. Tests, such as chest X-rays, are sometimes done to rule out other conditions.  TREATMENT  Acute bronchitis usually goes away in a couple weeks. Oftentimes, no medical treatment is necessary. Medicines are sometimes given for relief of fever or cough. Antibiotic medicines are usually not needed but may be prescribed in certain situations. In some cases, an inhaler may be recommended to help reduce shortness of breath and control the cough. A cool mist vaporizer may also be used to help thin bronchial secretions and make it easier to clear the chest.  HOME CARE INSTRUCTIONS  Get plenty of rest.   Drink enough fluids to keep your urine clear or pale yellow (unless you have a medical condition that requires fluid restriction). Increasing fluids may help thin your respiratory secretions (sputum) and reduce chest congestion, and it will prevent dehydration.   Take medicines only as directed by your health care provider.  If  you were prescribed an antibiotic medicine, finish it all even if you start to feel better.  Avoid smoking and secondhand smoke. Exposure to cigarette smoke or irritating chemicals will make bronchitis worse. If you are a smoker, consider using nicotine gum or skin patches to help control withdrawal symptoms. Quitting smoking will help your lungs heal faster.   Reduce the chances of another bout of acute bronchitis by washing your hands frequently, avoiding people with cold symptoms, and trying not to touch your hands to your mouth, nose, or eyes.   Keep all follow-up visits as directed by your health care provider.  SEEK MEDICAL CARE IF: Your symptoms do not improve after 1 week of treatment.  SEEK IMMEDIATE MEDICAL CARE IF:  You develop an increased fever or chills.   You have chest pain.   You have severe shortness of breath.  You have bloody sputum.   You develop dehydration.  You faint or repeatedly feel like you are going to pass out.  You develop repeated vomiting.  You develop a severe headache. MAKE SURE YOU:   Understand these instructions.  Will watch your condition.  Will get help right away if you are not doing well or get worse.   This information is not intended to replace advice given to you by your health care provider. Make sure you discuss any questions you have with your health care provider.   Document Released: 07/13/2004 Document Revised: 06/26/2014 Document Reviewed: 11/26/2012 Elsevier Interactive Patient Education 2016 [employer]  - Take meds as prescribed - Use a cool mist humidifier  -Use saline nose sprays frequently -Saline   irrigations of the nose can be very helpful if done frequently.  * 4X daily for 1 week*  * Use of a nettie pot can be helpful with this. Follow directions with this* -Force fluids -For any cough or congestion  Use plain Mucinex- regular strength or max strength is fine   * Children- consult with Pharmacist for  dosing -For fever or aces or pains- take tylenol or ibuprofen appropriate for age and weight.  * for fevers greater than 101 orally you may alternate ibuprofen and tylenol every  3 hours. -Throat lozenges if help    Jevonte Clanton, FNP  

## 2016-02-04 ENCOUNTER — Ambulatory Visit (INDEPENDENT_AMBULATORY_CARE_PROVIDER_SITE_OTHER): Payer: BLUE CROSS/BLUE SHIELD | Admitting: Pediatrics

## 2016-02-04 ENCOUNTER — Encounter: Payer: Self-pay | Admitting: Pediatrics

## 2016-02-04 VITALS — BP 133/76 | HR 71 | Temp 97.6°F | Ht 64.0 in | Wt 199.0 lb

## 2016-02-04 DIAGNOSIS — I10 Essential (primary) hypertension: Secondary | ICD-10-CM

## 2016-02-04 DIAGNOSIS — I700 Atherosclerosis of aorta: Secondary | ICD-10-CM | POA: Diagnosis not present

## 2016-02-04 DIAGNOSIS — R1013 Epigastric pain: Secondary | ICD-10-CM

## 2016-02-04 DIAGNOSIS — H65111 Acute and subacute allergic otitis media (mucoid) (sanguinous) (serous), right ear: Secondary | ICD-10-CM | POA: Diagnosis not present

## 2016-02-04 MED ORDER — AMOXICILLIN 875 MG PO TABS: 875.0000 mg | ORAL_TABLET | Freq: Two times a day (BID) | ORAL | 0 refills | Status: DC

## 2016-02-04 NOTE — Progress Notes (Signed)
  Subjective:   Patient ID: LEXY DANNELLY, female    DOB: 1961-01-26, 55 y.o.   MRN: [id number] CC: Cough; Sore Throat; and Ear Pain (right)  HPI: FLOREEN ESKANDER is a 55 y.o. female presenting for Cough; Sore Throat; and Ear Pain (right)  Using albuterol, prednisone for past week Now throat pain and R ear pain No fevers Appetite normal  Still with epigastric pain On protonix Helping some with reflux, but also with separate stabbing, burning pain in epigastric/LUQ area CT scan last month with mod stool burden Has endoscopy next  Relevant past medical, surgical, family and social history reviewed. Allergies and medications reviewed and updated. History  Smoking Status  . Never Smoker  Smokeless Tobacco  . Never Used   ROS: Per HPI   Objective:    BP 133/76   Pulse 71   Temp 97.6 F (36.4 C) (Oral)   Ht 5\' 4"  (1.626 m)   Wt 199 lb (90.3 kg)   LMP 05/18/2011   SpO2 97%   BMI 34.16 kg/m   Wt Readings from Last 3 Encounters:  02/04/16 199 lb (90.3 kg)  01/27/16 199 lb (90.3 kg)  01/02/16 188 lb (85.3 kg)    Gen: NAD, alert, cooperative with exam, NCAT EYES: EOMI, no conjunctival injection, or no icterus ENT:  R TM slightly injected, mucus behind TM, bulging, L TM pearly gray, OP without erythema LYMPH: R side of neck with post-radiation changes, hardened areas, L side no LAD CV: NRRR, normal S1/S2, no murmur, distal pulses 2+ b/l Resp: CTABL, no wheezes, normal WOB Abd: +BS, soft, mildly tender epigastric and LUQ area with palpation. no guarding or organomegaly Ext: No edema, warm Neuro: Alert and oriented  Assessment & Plan:  Joneisha was seen today for cough, sore throat and ear pain. Discussed symptomatic care. Treat for AOM with abx as below. Has been on amox without reaction in the past.  Diagnoses and all orders for this visit:  Acute mucoid otitis media of right ear -     amoxicillin (AMOXIL) 875 MG tablet; Take 1 tablet (875 mg total) by mouth 2 (two)  times daily.  Aortic atherosclerosis ([place]) Found on CT scan Pt on aspirin  Essential (primary) hypertension Well controlled, cont meds  Epigastric pain Reflux improved with protonix, continues to have LUQ pain, sharp, burning Has endoscopy scheduled for next week CT scan unrevealin gother than mod stool burden Pt has been stooling more regularly since then with no improvement in pain  Follow up plan: Return in about 6 months (around 08/06/2016). Assunta Found, MD [place]

## 2016-02-07 ENCOUNTER — Encounter (HOSPITAL_COMMUNITY): Payer: Self-pay | Admitting: *Deleted

## 2016-02-07 NOTE — Progress Notes (Signed)
Patient ID: DENIELLE COMEGYS, female   DOB: 04/17/1961, 55 y.o.   MRN: [id number]   55 y.o. previous SSCP with negative w/u . .. Had normal cath in [facility] about 9 years ago Chronically abnormal ECG   Myovue 03/21/12 normal EF 65% Short burst of PSVT BP still borderline. 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  Cath with no CAD 1. Normal coronary arteries. 2. Moderately to severely reduced LV systolic function with an ejection fraction of 30-35% with severe hypokinesis of the mid distal anterior, apical and mid to distal inferior walls consistent with stress-induced cardiomyopathy. 3. Mildly elevated left ventricular end-diastolic pressure.  July 2016 Echo reviewed and EF 45%   Started on ACE and beta blocker  September 2016 f/u MRI with normal EF 64% and no gadolinium uptake  06/22/15 seen in ER for chest pain  R/O not thought to be cardiac  CTA negative for PE  Multitude of somatic complaints including weight gain , back pain, depression ,  Husband indicates she is anxious a lot  Had EGD with Buccini yesterday no pathology sending her to [facility] for ? Manometry   ROS: Denies fever, malais, weight loss, blurry vision, decreased visual acuity, cough, sputum, SOB, hemoptysis, pleuritic pain, palpitaitons, heartburn, abdominal pain, melena, lower extremity edema, claudication, or rash.  All other systems reviewed and negative  General: Affect appropriate Healthy:  appears stated age 3: normal Neck supple with no adenopathy JVP normal no bruits no thyromegaly Lungs clear with no wheezing and good diaphragmatic motion Heart:  S1/S2 no murmur, no rub, gallop or click PMI normal Abdomen: benighn, BS positve, no tenderness, no AAA no bruit.  No HSM or HJR Distal pulses intact with no bruits No edema Neuro non-focal Skin warm  and dry No muscular weakness   No current facility-administered medications for this visit.    No current outpatient prescriptions on file.   Facility-Administered Medications Ordered in Other Visits  Medication Dose Route Frequency Provider Last Rate Last Dose  . 0.9 %  sodium chloride infusion   Intravenous Continuous Ronald Lobo, MD      . lactated ringers infusion   Intravenous Continuous Ronald Lobo, MD 10 mL/hr at 02/10/16 1048 1,000 mL at 02/10/16 1048    Allergies  Ativan [lorazepam] and Cephalexin  Electrocardiogram: 9/14  SR nonspecific ST changes  01/02/15  SR rate 72 LAD low voltage nonspecific ST changes persist  06/22/15 SR rate 69 anterolateral T wave changes   Assessment and Plan Chest Pain: resolved no CAD on cath 01/02/15  ? Esophageal spasm f/u Buccini has been [facility] To [facility]:  EF back to normal by MRI September 16  Continue beta blocker and ACE Esoph:  On protonix f/u primary  HTN:  Well controlled.  Continue current medications and low sodium Dash type diet.   Chol:  lipitor started f/u labs in 2 months  Lab Results  Component Value Date   LDLCALC 76 03/27/2015  Fatigue:  F/u primary for routine blood work to include thyroid.      Jenkins Rouge

## 2016-02-09 ENCOUNTER — Other Ambulatory Visit: Payer: Self-pay | Admitting: Gastroenterology

## 2016-02-10 ENCOUNTER — Encounter (HOSPITAL_COMMUNITY)
Admission: RE | Disposition: A | Discharge status: Home / Self Care | Payer: Self-pay | Source: Ambulatory Visit | Attending: Gastroenterology

## 2016-02-10 ENCOUNTER — Encounter (HOSPITAL_COMMUNITY): Payer: Self-pay | Admitting: *Deleted

## 2016-02-10 ENCOUNTER — Ambulatory Visit (HOSPITAL_COMMUNITY): Payer: BLUE CROSS/BLUE SHIELD | Admitting: Anesthesiology

## 2016-02-10 ENCOUNTER — Ambulatory Visit (HOSPITAL_COMMUNITY)
Admission: RE | Admit: 2016-02-10 | Discharge: 2016-02-10 | Disposition: A | Discharge status: Home / Self Care | Payer: BLUE CROSS/BLUE SHIELD | Source: Ambulatory Visit | Attending: Gastroenterology | Admitting: Gastroenterology

## 2016-02-10 DIAGNOSIS — R079 Chest pain, unspecified: Secondary | ICD-10-CM | POA: Insufficient documentation

## 2016-02-10 DIAGNOSIS — E7800 Pure hypercholesterolemia, unspecified: Secondary | ICD-10-CM | POA: Diagnosis not present

## 2016-02-10 DIAGNOSIS — Z923 Personal history of irradiation: Secondary | ICD-10-CM | POA: Diagnosis not present

## 2016-02-10 DIAGNOSIS — I252 Old myocardial infarction: Secondary | ICD-10-CM | POA: Insufficient documentation

## 2016-02-10 DIAGNOSIS — K219 Gastro-esophageal reflux disease without esophagitis: Secondary | ICD-10-CM | POA: Diagnosis not present

## 2016-02-10 DIAGNOSIS — I10 Essential (primary) hypertension: Secondary | ICD-10-CM | POA: Insufficient documentation

## 2016-02-10 DIAGNOSIS — E669 Obesity, unspecified: Secondary | ICD-10-CM | POA: Insufficient documentation

## 2016-02-10 DIAGNOSIS — J45909 Unspecified asthma, uncomplicated: Secondary | ICD-10-CM | POA: Insufficient documentation

## 2016-02-10 DIAGNOSIS — Z7982 Long term (current) use of aspirin: Secondary | ICD-10-CM | POA: Diagnosis not present

## 2016-02-10 DIAGNOSIS — Z79899 Other long term (current) drug therapy: Secondary | ICD-10-CM | POA: Insufficient documentation

## 2016-02-10 DIAGNOSIS — Z8589 Personal history of malignant neoplasm of other organs and systems: Secondary | ICD-10-CM | POA: Diagnosis not present

## 2016-02-10 DIAGNOSIS — Z85820 Personal history of malignant melanoma of skin: Secondary | ICD-10-CM | POA: Diagnosis not present

## 2016-02-10 DIAGNOSIS — I739 Peripheral vascular disease, unspecified: Secondary | ICD-10-CM | POA: Insufficient documentation

## 2016-02-10 DIAGNOSIS — R1310 Dysphagia, unspecified: Secondary | ICD-10-CM | POA: Diagnosis not present

## 2016-02-10 DIAGNOSIS — Z6834 Body mass index (BMI) 34.0-34.9, adult: Secondary | ICD-10-CM | POA: Insufficient documentation

## 2016-02-10 HISTORY — PX: ESOPHAGOGASTRODUODENOSCOPY (EGD) WITH PROPOFOL: SHX5813

## 2016-02-10 SURGERY — ESOPHAGOGASTRODUODENOSCOPY (EGD) WITH PROPOFOL
Anesthesia: Monitor Anesthesia Care

## 2016-02-10 MED ORDER — ONDANSETRON HCL 4 MG/2ML IJ SOLN
INTRAMUSCULAR | Status: DC | PRN
  Administered 2016-02-10: 4 mg via INTRAVENOUS

## 2016-02-10 MED ORDER — LACTATED RINGERS IV SOLN
INTRAVENOUS | Status: DC
  Administered 2016-02-10: 1000 mL via INTRAVENOUS

## 2016-02-10 MED ORDER — ONDANSETRON HCL 4 MG/2ML IJ SOLN
INTRAMUSCULAR | Status: AC
  Filled 2016-02-10: qty 2

## 2016-02-10 MED ORDER — PROPOFOL 500 MG/50ML IV EMUL
INTRAVENOUS | Status: DC | PRN
  Administered 2016-02-10: 200 ug/kg/min via INTRAVENOUS

## 2016-02-10 MED ORDER — PROPOFOL 10 MG/ML IV BOLUS
INTRAVENOUS | Status: AC
  Filled 2016-02-10: qty 40

## 2016-02-10 MED ORDER — SODIUM CHLORIDE 0.9 % IV SOLN: INTRAVENOUS | Status: DC

## 2016-02-10 SURGICAL SUPPLY — 14 items

## 2016-02-10 NOTE — Op Note (Signed)
[facility] Patient Name: [name] Procedure Date: 02/10/2016 MRN: [id number] Attending MD: Ronald Lobo , MD Date of Birth: 07-Oct-1960 CSN: [id number] Age: 55 Admit Type: Outpatient Procedure:                Upper GI endoscopy Indications:              Unexplained chest pain Providers:                Ronald Lobo, MD, Elmer Ramp. Tilden Dome, RN, Otilio Saber, Technician, Virgia Land, CRNA Referring MD:              Medicines:                Propofol per Anesthesia Complications:            No immediate complications. Estimated Blood Loss:     Estimated blood loss: none. Procedure:                Pre-Anesthesia Assessment:                           - Prior to the procedure, a History and Physical                            was performed, and patient medications and                            allergies were reviewed. The patient's tolerance of                            previous anesthesia was also reviewed. The risks                            and benefits of the procedure and the sedation                            options and risks were discussed with the patient.                            All questions were answered, and informed consent                            was obtained. Prior Anticoagulants: The patient has                            taken no previous anticoagulant or antiplatelet                            agents. ASA Grade Assessment: III - A patient with                            severe systemic disease. After reviewing the risks  and benefits, the patient was deemed in                            satisfactory condition to undergo the procedure.                           After obtaining informed consent, the endoscope was                            passed under direct vision. Throughout the                            procedure, the patient's blood pressure, pulse, and    oxygen saturations were monitored continuously. The                            [id number]) scope was introduced through the                            mouth, and advanced to the second part of duodenum.                            The upper GI endoscopy was accomplished without                            difficulty. The patient tolerated the procedure                            well. Scope In: Scope Out: Findings:      The examined esophagus was normal.      There is no endoscopic evidence of Barrett's esophagus, esophagitis,       hiatal hernia, mucosal abnormalities, stricture, ulcerations, signs of       external compression, abnormal motility or lower esophageal sphincter       tone abnormalities in the entire esophagus.      The entire examined stomach was normal.      The cardia and gastric fundus were normal on retroflexion.      The examined duodenum was normal. Impression:               - Normal esophagus. No cause for patient's ongoing                            intermittent chest pain and swallowing difficulties                            endoscopically evident.                           - Normal stomach.                           - Normal examined duodenum.                           - No specimens collected. Moderate Sedation:      This  patient was sedated with monitored anesthesia care, not moderate       sedation. Recommendation:           - Observe patient's clinical course.                           - Continue present medications.                           - Perform barium swallow with tablet, and/or                            routine esophageal manometry, if symptoms persist.                           - Resume previous diet. Procedure Code(s):        --- Professional ---                           [phone], Esophagogastroduodenoscopy, flexible,                            transoral; diagnostic, including collection of                            specimen(s) by  brushing or washing, when performed                            (separate procedure) Diagnosis Code(s):        --- Professional ---                           R07.9, Chest pain, unspecified CPT copyright 2016 American Medical Association. All rights reserved. The codes documented in this report are preliminary and upon coder review may  be revised to meet current compliance requirements. Ronald Lobo, MD 02/10/2016 12:34:53 PM This report has been signed electronically. Number of Addenda: 0

## 2016-02-10 NOTE — H&P (Signed)
[name] is an 55 y.o. female.   Chief Complaint: chest pain HPI: Several week h/o chest burning pain unresponsive to high-dose PPI therapy, w/ prox esoph dysphagia sx.  Doing somewhat better at this time.  Past Medical History:  Diagnosis Date  . Anxiety   . Arthritis    back (12/30/2014)  . Childhood asthma   . Daily headache   . Depression   . GERD (gastroesophageal reflux disease)   . History of palpitations    STRESS INDUCED  . Hypercholesterolemia dx'd 12/2014  . Kidney cysts    "I think it was right"  . Kidney stones   . Left ureteral calculus   . 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 NSTEMI (non-ST elevated myocardial infarction) ([place]) 12/28/2014  . PONV (postoperative nausea and vomiting)   . Takotsubo syndrome    "Broken heart syndrome"    Past Surgical History:  Procedure Laterality Date  . BACK SURGERY    . BREAST BIOPSY Bilateral early 2000's  . CARDIAC CATHETERIZATION  2006 (APPROX)  [place]   NORMAL  . CARDIAC CATHETERIZATION  12/30/2014  . CARDIAC CATHETERIZATION N/A 12/30/2014   Procedure: Left Heart Cath and Coronary Angiography;  Surgeon: Wellington Hampshire, MD;  Location: [place];  Service: Cardiovascular;  Laterality: N/A;  . CARDIOVASCULAR STRESS TEST  03-21-2012  DR Johnsie Cancel   NORMAL NUCLEAR STUDY/  NO ISCHEMIA/  EF 63%  . CYSTOSCOPY W/ URETERAL STENT PLACEMENT Left 03/26/2013   Procedure: CYSTOSCOPY WITH RETROGRADE PYELOGRAM ;  Surgeon: Molli Hazard, MD;  Location: [facility];  Service: Urology;  Laterality: Left;  . CYSTOSCOPY WITH RETROGRADE PYELOGRAM, URETEROSCOPY AND STENT PLACEMENT Bilateral 03/19/2013   Procedure: CYSTOSCOPY WITH  RETROGRADE PYELOGRAM, BILATERAL URETEROSCOPY AND STENT PLACEMENT LEFT URETER,BILATERAL STONE EXTRACTION , HOLMIUM LASER LEFT URETER;  Surgeon: Molli Hazard, MD;  Location: [facility];  Service: Urology;  Laterality: Bilateral;  . CYSTOSCOPY WITH STENT PLACEMENT Left 03/26/2013   Procedure: CYSTOSCOPY WITH STENT PLACEMENT;  Surgeon: Molli Hazard, MD;  Location: [facility];  Service: Urology;  Laterality: Left;  . DIAGNOSTIC LAPAROSCOPY  04-12-2009  . KIDNEY SURGERY  1966   BILATERAL URETER'S DILATATION  . LUMBAR LAMINECTOMY/DECOMPRESSION MICRODISCECTOMY  05/18/2011   Procedure: LUMBAR LAMINECTOMY/DECOMPRESSION MICRODISCECTOMY;  Surgeon: Johnn Hai;  Location: [facility];  Service: Orthopedics;  Laterality: Right;  Decompression Lumbar 4-Lumbar 5  Right    (xray)   . MELANOMA EXCISION WITH SENTINEL LYMPH NODE BIOPSY  2001   moh's procedure/  RIGHT FOREHEAD AND UPPER EYEBROW  . RIGHT LATERAL PAROTIDECTOMY W/ NERVE DISSECTION / RIGHT MODIFIED RADICAL NECK DISSECTION SPARING SCM ELEVENTH NERVE AND INTERNAL JUGULAR VEIN  09-12-2006  DR DWIGHT BATES   DR DWIGHT BATES; "inside gland; lots of lymph nodes"    Family History  Problem Relation Age of Onset  . Hypertension Mother   . COPD Mother   . Cancer Mother     breast  . Dementia Mother   . Heart disease Father   . Cancer Father     Colorectal  . Hyperlipidemia Father   . Hypertension Father    Social  History:  reports that she has never smoked. She has never used smokeless tobacco. She reports that she drinks alcohol. She reports that she does not use drugs.  Allergies:  Allergies  Allergen Reactions  . Ativan [Lorazepam] Shortness Of Breath and Other (See Comments)    She may be very sensitive to benzo.   . Cephalexin Shortness Of Breath and Rash    Medications Prior to Admission  Medication Sig Dispense Refill  . albuterol (PROVENTIL HFA;VENTOLIN HFA) 108 (90 Base) MCG/ACT inhaler Inhale 2 puffs into  the lungs every 6 (six) hours as needed for wheezing or shortness of breath. 1 Inhaler 2  . amoxicillin (AMOXIL) 875 MG tablet Take 1 tablet (875 mg total) by mouth 2 (two) times daily. 10 tablet 0  . aspirin 81 MG chewable tablet Chew 81 mg by mouth daily.    Marland Kitchen atorvastatin (LIPITOR) 20 MG tablet TAKE ONE TABLET BY MOUTH ONCE DAILY (Patient taking differently: TAKE 20 MG BY MOUTH ONCE DAILY) 30 tablet 5  . Calcium Carb-Cholecalciferol (CALCIUM 600 + D) 600-200 MG-UNIT TABS Take 2 tablets by mouth daily.     . carvedilol (COREG) 3.125 MG tablet TAKE ONE TABLET BY MOUTH TWICE DAILY WITH  A  MEAL (Patient taking differently: TAKE 3.125 MG BY MOUTH TWICE DAILY WITH  A  MEAL) 60 tablet 5  . gabapentin (NEURONTIN) 300 MG capsule Take 300 mg by mouth 3 (three) times daily.     Marland Kitchen ketotifen (THERA TEARS ALLERGY) 0.025 % ophthalmic solution Apply 1 drop to eye daily as needed (for dry eye relief).    Marland Kitchen lisinopril (PRINIVIL,ZESTRIL) 2.5 MG tablet TAKE ONE TABLET BY MOUTH ONCE DAILY (Patient taking differently: TAKE 2.5 MG BY MOUTH ONCE DAILY) 30 tablet 3  . lubiprostone (AMITIZA) 24 MCG capsule Take 24 mcg by mouth 2 (two) times daily with a meal.    . magnesium oxide (MAG-OX) 400 MG tablet Take 400 mg by mouth daily.    . Multiple Vitamin (MULTIVITAMIN) tablet Take 1 tablet by mouth daily.    . nitroGLYCERIN (NITROSTAT) 0.4 MG SL tablet Place 1 tablet (0.4 mg total) under the tongue every 5 (five) minutes as needed for chest pain. 25 tablet 3  . pantoprazole (PROTONIX) 40 MG tablet Take 40 mg by mouth 2 (two) times daily.     . traMADol (ULTRAM) 50 MG tablet Take 50 mg by mouth every 6 (six) hours as needed for moderate pain.       No results found for this or any previous visit (from the past 48 hour(s)). No results found.  ROS see HPI.  Sx started prior to recent antbx for sinus infection.  Overall, no clear trend toward improvement or worsening--periodic sx flare-ups.  Blood pressure 129/76, pulse  74, temperature 98.3 F (36.8 C), temperature source Oral, resp. rate 20, height 5\' 4"  (1.626 m), weight 90.3 kg (199 lb), last menstrual period 05/18/2011, SpO2 96 %. Physical Exam  Healthy appearance, s/p surg/radn changes right face/neck from parotid cancer.  Chest clr, heart nl w/out murmur, abd nontender, neuro grossly nl.  Nl mental status, nl mood  Assessment/Plan Chest pain of unclear cause; ??? Viral esophagitis.    Will proceed to egd.  Risks reviewed, pt agreeable.  Cleotis Nipper, MD 02/10/2016, 12:14 PM

## 2016-02-10 NOTE — Transfer of Care (Signed)
Immediate Anesthesia Transfer of Care Note  Patient: [name]  Procedure(s) Performed: Procedure(s): ESOPHAGOGASTRODUODENOSCOPY (EGD) WITH PROPOFOL (N/A)  Patient Location: PACU  Anesthesia Type:MAC  Level of Consciousness:  sedated, patient cooperative and responds to stimulation  Airway & Oxygen Therapy:Patient Spontanous Breathing and Patient connected to face mask oxgen  Post-op Assessment:  Report given to PACU RN and Post -op Vital signs reviewed and stable  Post vital signs:  Reviewed and stable  Last Vitals:  Vitals:   02/10/16 1035  BP: 129/76  Pulse: 74  Resp: 20  Temp: [id number] C    Complications: No apparent anesthesia complications

## 2016-02-10 NOTE — Anesthesia Preprocedure Evaluation (Addendum)
Anesthesia Evaluation  Patient identified by MRN, date of birth, ID band Patient awake    Reviewed: Allergy & Precautions, NPO status , Patient's Chart, lab work & pertinent test results  History of Anesthesia Complications (+) PONV and history of anesthetic complications  Airway Mallampati: II  TM Distance: >3 FB Neck ROM: Limited   Comment: S/p right parotid cancer surgery with radiation therapy. Dry mouth and chronic pain in right jaw and neck. Dental no notable dental hx.    Pulmonary asthma ,    Pulmonary exam normal breath sounds clear to auscultation       Cardiovascular hypertension, + Past MI and + Peripheral Vascular Disease  Normal cardiovascular exam Rhythm:Regular Rate:Normal  NSTEMI July 2016 ("Takasobo" syndrome)  EF 45% last check.  Sees Dr. Johnsie Cancel cardiologist every six months.  Doing fine without symptoms recently.   Neuro/Psych  Headaches, PSYCHIATRIC DISORDERS Anxiety Depression    GI/Hepatic Neg liver ROS, GERD  ,  Endo/Other  negative endocrine ROS  Renal/GU Renal disease  negative genitourinary   Musculoskeletal  (+) Arthritis ,   Abdominal (+) + obese,   Peds negative pediatric ROS (+)  Hematology negative hematology ROS (+)   Anesthesia Other Findings   Reproductive/Obstetrics negative OB ROS                            Anesthesia Physical Anesthesia Plan  ASA: III  Anesthesia Plan: MAC   Post-op Pain Management:    Induction: Intravenous  Airway Management Planned:   Additional Equipment:   Intra-op Plan:   Post-operative Plan: Extubation in OR  Informed Consent: I have reviewed the patients History and Physical, chart, labs and discussed the procedure including the risks, benefits and alternatives for the proposed anesthesia with the patient or authorized representative who has indicated his/her understanding and acceptance.   Dental advisory  given  Plan Discussed with: CRNA  Anesthesia Plan Comments:         Anesthesia Quick Evaluation

## 2016-02-10 NOTE — Anesthesia Postprocedure Evaluation (Signed)
Anesthesia Post Note  Patient: [name]  Procedure(s) Performed: Procedure(s) (LRB): ESOPHAGOGASTRODUODENOSCOPY (EGD) WITH PROPOFOL (N/A)  Patient location during evaluation: PACU Anesthesia Type: MAC Level of consciousness: awake and alert Pain management: pain level controlled Vital Signs Assessment: post-procedure vital signs reviewed and stable Respiratory status: spontaneous breathing, nonlabored ventilation, respiratory function stable and patient connected to nasal cannula oxygen Cardiovascular status: stable and blood pressure returned to baseline Anesthetic complications: no    Last Vitals:  Vitals:   02/10/16 1240 02/10/16 1250  BP: (!) 118/50 115/77  Pulse: 70 68  Resp: (!) 23 13  Temp:      Last Pain:  Vitals:   02/10/16 1233  TempSrc: Oral                 Kandiss Ihrig J

## 2016-02-11 ENCOUNTER — Ambulatory Visit (INDEPENDENT_AMBULATORY_CARE_PROVIDER_SITE_OTHER): Payer: BLUE CROSS/BLUE SHIELD | Admitting: Cardiovascular Disease

## 2016-02-11 ENCOUNTER — Encounter: Payer: Self-pay | Admitting: Cardiovascular Disease

## 2016-02-11 VITALS — BP 130/70 | HR 78 | Ht 64.0 in | Wt 194.8 lb

## 2016-02-11 DIAGNOSIS — Z09 Encounter for follow-up examination after completed treatment for conditions other than malignant neoplasm: Secondary | ICD-10-CM

## 2016-02-11 NOTE — Patient Instructions (Addendum)

## 2016-02-14 ENCOUNTER — Other Ambulatory Visit: Payer: BLUE CROSS/BLUE SHIELD

## 2016-02-14 ENCOUNTER — Other Ambulatory Visit: Payer: Self-pay | Admitting: Gastroenterology

## 2016-02-14 DIAGNOSIS — R079 Chest pain, unspecified: Secondary | ICD-10-CM

## 2016-02-17 ENCOUNTER — Other Ambulatory Visit: Payer: BLUE CROSS/BLUE SHIELD

## 2016-02-17 ENCOUNTER — Ambulatory Visit
Admission: RE | Admit: 2016-02-17 | Discharge: 2016-02-17 | Disposition: A | Discharge status: Home / Self Care | Payer: BLUE CROSS/BLUE SHIELD | Source: Ambulatory Visit | Attending: Gastroenterology | Admitting: Gastroenterology

## 2016-02-17 DIAGNOSIS — R079 Chest pain, unspecified: Secondary | ICD-10-CM

## 2016-03-10 ENCOUNTER — Ambulatory Visit (INDEPENDENT_AMBULATORY_CARE_PROVIDER_SITE_OTHER): Payer: BLUE CROSS/BLUE SHIELD

## 2016-03-10 DIAGNOSIS — Z23 Encounter for immunization: Secondary | ICD-10-CM | POA: Diagnosis not present

## 2016-04-21 ENCOUNTER — Emergency Department (HOSPITAL_COMMUNITY): Payer: BLUE CROSS/BLUE SHIELD

## 2016-04-21 ENCOUNTER — Other Ambulatory Visit: Payer: Self-pay

## 2016-04-21 ENCOUNTER — Emergency Department (HOSPITAL_COMMUNITY)
Admission: EM | Admit: 2016-04-21 | Discharge: 2016-04-21 | Disposition: A | Discharge status: Home / Self Care | Payer: BLUE CROSS/BLUE SHIELD | Attending: Emergency Medicine | Admitting: Emergency Medicine

## 2016-04-21 ENCOUNTER — Encounter (HOSPITAL_COMMUNITY): Payer: Self-pay | Admitting: Cardiology

## 2016-04-21 ENCOUNTER — Other Ambulatory Visit: Payer: Self-pay | Admitting: Cardiovascular Disease

## 2016-04-21 DIAGNOSIS — R319 Hematuria, unspecified: Secondary | ICD-10-CM

## 2016-04-21 DIAGNOSIS — R109 Unspecified abdominal pain: Secondary | ICD-10-CM | POA: Diagnosis present

## 2016-04-21 DIAGNOSIS — M545 Low back pain, unspecified: Secondary | ICD-10-CM

## 2016-04-21 DIAGNOSIS — I10 Essential (primary) hypertension: Secondary | ICD-10-CM | POA: Insufficient documentation

## 2016-04-21 DIAGNOSIS — Z79899 Other long term (current) drug therapy: Secondary | ICD-10-CM | POA: Insufficient documentation

## 2016-04-21 DIAGNOSIS — Z791 Long term (current) use of non-steroidal anti-inflammatories (NSAID): Secondary | ICD-10-CM | POA: Diagnosis not present

## 2016-04-21 DIAGNOSIS — R300 Dysuria: Secondary | ICD-10-CM | POA: Insufficient documentation

## 2016-04-21 DIAGNOSIS — K5900 Constipation, unspecified: Secondary | ICD-10-CM

## 2016-04-21 DIAGNOSIS — Z7982 Long term (current) use of aspirin: Secondary | ICD-10-CM | POA: Diagnosis not present

## 2016-04-21 LAB — URINALYSIS, ROUTINE W REFLEX MICROSCOPIC
BILIRUBIN URINE: NEGATIVE
GLUCOSE, UA: NEGATIVE mg/dL
KETONES UR: 15 mg/dL — AB
Leukocytes, UA: NEGATIVE
Nitrite: NEGATIVE
PH: 7 (ref 5.0–8.0)
PROTEIN: NEGATIVE mg/dL
Specific Gravity, Urine: 1.015 (ref 1.005–1.030)

## 2016-04-21 LAB — CBC WITH DIFFERENTIAL/PLATELET
BASOS ABS: 0 10*3/uL (ref 0.0–0.1)
BASOS PCT: 0 %
EOS ABS: 0.1 10*3/uL (ref 0.0–0.7)
EOS PCT: 2 %
HEMATOCRIT: 35.5 % — AB (ref 36.0–46.0)
Hemoglobin: 11.9 g/dL — ABNORMAL LOW (ref 12.0–15.0)
Lymphocytes Relative: 16 %
Lymphs Abs: 0.7 10*3/uL (ref 0.7–4.0)
MCH: 30 pg (ref 26.0–34.0)
MCHC: 33.5 g/dL (ref 30.0–36.0)
MCV: 89.4 fL (ref 78.0–100.0)
MONO ABS: 0.3 10*3/uL (ref 0.1–1.0)
MONOS PCT: 7 %
NEUTROS ABS: 3.3 10*3/uL (ref 1.7–7.7)
Neutrophils Relative %: 75 %
PLATELETS: 160 10*3/uL (ref 150–400)
RBC: 3.97 MIL/uL (ref 3.87–5.11)
RDW: 12.1 % (ref 11.5–15.5)
WBC: 4.4 10*3/uL (ref 4.0–10.5)

## 2016-04-21 LAB — BASIC METABOLIC PANEL
Anion gap: 7 (ref 5–15)
BUN: 13 mg/dL (ref 6–20)
CALCIUM: 8.8 mg/dL — AB (ref 8.9–10.3)
CO2: 27 mmol/L (ref 22–32)
CREATININE: 0.7 mg/dL (ref 0.44–1.00)
Chloride: 103 mmol/L (ref 101–111)
GLUCOSE: 100 mg/dL — AB (ref 65–99)
Potassium: 4.1 mmol/L (ref 3.5–5.1)
Sodium: 137 mmol/L (ref 135–145)

## 2016-04-21 LAB — URINE MICROSCOPIC-ADD ON

## 2016-04-21 MED ORDER — METHOCARBAMOL 500 MG PO TABS: 500.0000 mg | ORAL_TABLET | Freq: Three times a day (TID) | ORAL | 0 refills | Status: DC

## 2016-04-21 MED ORDER — HYDROMORPHONE HCL 1 MG/ML IJ SOLN
1.0000 mg | Freq: Once | INTRAMUSCULAR | Status: AC
  Administered 2016-04-21: 1 mg via INTRAVENOUS
  Filled 2016-04-21: qty 1

## 2016-04-21 MED ORDER — NAPROXEN 500 MG PO TABS: 500.0000 mg | ORAL_TABLET | Freq: Two times a day (BID) | ORAL | 0 refills | Status: DC

## 2016-04-21 MED ORDER — MORPHINE SULFATE (PF) 4 MG/ML IV SOLN
4.0000 mg | Freq: Once | INTRAVENOUS | Status: AC
  Administered 2016-04-21: 4 mg via INTRAVENOUS
  Filled 2016-04-21: qty 1

## 2016-04-21 MED ORDER — ONDANSETRON HCL 4 MG/2ML IJ SOLN
4.0000 mg | Freq: Once | INTRAMUSCULAR | Status: AC
  Administered 2016-04-21: 4 mg via INTRAVENOUS
  Filled 2016-04-21: qty 2

## 2016-04-21 MED ORDER — KETOROLAC TROMETHAMINE 30 MG/ML IJ SOLN
30.0000 mg | Freq: Once | INTRAMUSCULAR | Status: AC
  Administered 2016-04-21: 30 mg via INTRAVENOUS
  Filled 2016-04-21: qty 1

## 2016-04-21 NOTE — ED Triage Notes (Signed)
Lower back pain that radiates into left side since 4 am.  States she has recently been treated for a kidney stone and UTI.

## 2016-04-21 NOTE — Discharge Instructions (Signed)
Continue taking her Cipro as directed. Take an over-the-counter laxative for constipation. You may want to try Dulcolax or magnesium citrate. Call your urologist to arrange a follow-up appointment. Return to ER for any worsening symptoms.

## 2016-04-21 NOTE — ED Provider Notes (Signed)
[facility] DEPT Provider Note   CSN: [id number] Arrival date & time: 04/21/16  0827     History   Chief Complaint No chief complaint on file.   HPI KAMIRAH HAEFELE is a 55 y.o. female.  HPI   MONAE MCTEAGUE is a 55 y.o. female who presents to the Emergency Department complaining of low back pain for several days.  She describes an aching pain across her lower back with left side worse than right. She states that she was seen by her PCP earlier this week and diagnosed with a "kidney infection" she was started on Cipro twice daily, but she states the pain is not improving. She also describes a burning with urination. Patient has history of kidney stones states pain feels somewhat similar. She's been taking tramadol at home without relief of her pain. Pain is worse with lying and sitting improves somewhat with standing and walking. She denies recent injury, fever, chills, vomiting, urine or bowel retention or incontinence, numbness or weakness of the lower extremities.   Past Medical History:  Diagnosis Date  . Anxiety   . Arthritis    back (12/30/2014)  . Childhood asthma   . Daily headache   . Depression   . GERD (gastroesophageal reflux disease)   . History of palpitations    STRESS INDUCED  . Hypercholesterolemia dx'd 12/2014  . Kidney cysts    "I think it was right"  . Kidney stones   . Left ureteral calculus   . 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 NSTEMI (non-ST elevated myocardial infarction) ([place]) 12/28/2014  . PONV (postoperative nausea and vomiting)   . Takotsubo syndrome    "Broken heart syndrome"    Patient Active Problem List   Diagnosis Date Noted  . Aortic atherosclerosis  ([place]) 02/04/2016  . Constipation 12/29/2015  . Respiratory failure ([place]) 12/26/2015  . Hyperlipidemia 12/26/2015  . Essential (primary) hypertension 12/26/2015  . Abdominal pain 12/26/2015  . Urinary tract infection, site not specified 03/27/2015  . Cardiomyopathy ([place])   . ICM- EF 30-35% at cath 12/30/14-improved to 45% by echo 01/06/15 01/06/2015  . Orthostatic hypotension 01/06/2015  . Near syncope 01/05/2015  . IBS (irritable bowel syndrome) 01/05/2015  . Biliary dyskinesia 01/05/2015  . Recent NSTEMI ([place] event 12/30/14) 12/29/2014  . GERD (gastroesophageal reflux disease) 03/21/2012    Past Surgical History:  Procedure Laterality Date  . BACK SURGERY    . BREAST BIOPSY Bilateral early 2000's  . CARDIAC CATHETERIZATION  2006 (APPROX)  [place]   NORMAL  . CARDIAC CATHETERIZATION  12/30/2014  . CARDIAC CATHETERIZATION N/A 12/30/2014   Procedure: Left Heart Cath and Coronary Angiography;  Surgeon: Wellington Hampshire, MD;  Location: [facility];  Service: Cardiovascular;  Laterality: N/A;  . CARDIOVASCULAR STRESS TEST  03-21-2012  DR Johnsie Cancel   NORMAL NUCLEAR STUDY/  NO ISCHEMIA/  EF 63%  . CYSTOSCOPY W/ URETERAL STENT PLACEMENT Left 03/26/2013   Procedure: CYSTOSCOPY WITH RETROGRADE PYELOGRAM ;  Surgeon: Molli Hazard, MD;  Location[facility];  Service: Urology;  Laterality: Left;  . CYSTOSCOPY WITH RETROGRADE PYELOGRAM, URETEROSCOPY AND STENT PLACEMENT Bilateral 03/19/2013  Procedure: CYSTOSCOPY WITH RETROGRADE PYELOGRAM, BILATERAL URETEROSCOPY AND STENT PLACEMENT LEFT URETER,BILATERAL STONE EXTRACTION , HOLMIUM LASER LEFT URETER;  Surgeon: Molli Hazard, MD;  Location: [facility];  Service: Urology;  Laterality: Bilateral;  . CYSTOSCOPY WITH STENT PLACEMENT Left 03/26/2013   Procedure: CYSTOSCOPY WITH STENT PLACEMENT;  Surgeon: Molli Hazard, MD;  Location: [facility];  Service: Urology;  Laterality: Left;  .  DIAGNOSTIC LAPAROSCOPY  04-12-2009  . ESOPHAGOGASTRODUODENOSCOPY (EGD) WITH PROPOFOL N/A 02/10/2016   Procedure: ESOPHAGOGASTRODUODENOSCOPY (EGD) WITH PROPOFOL;  Surgeon: Ronald Lobo, MD;  Location: [facility];  Service: Endoscopy;  Laterality: N/A;  . KIDNEY SURGERY  1966   BILATERAL URETER'S DILATATION  . LUMBAR LAMINECTOMY/DECOMPRESSION MICRODISCECTOMY  05/18/2011   Procedure: LUMBAR LAMINECTOMY/DECOMPRESSION MICRODISCECTOMY;  Surgeon: Johnn Hai;  Location: [facility];  Service: Orthopedics;  Laterality: Right;  Decompression Lumbar 4-Lumbar 5  Right    (xray)   . MELANOMA EXCISION WITH SENTINEL LYMPH NODE BIOPSY  2001   moh's procedure/  RIGHT FOREHEAD AND UPPER EYEBROW  . RIGHT LATERAL PAROTIDECTOMY W/ NERVE DISSECTION / RIGHT MODIFIED RADICAL NECK DISSECTION SPARING SCM ELEVENTH NERVE AND INTERNAL JUGULAR VEIN  09-12-2006  DR DWIGHT BATES   DR DWIGHT BATES; "inside gland; lots of lymph nodes"    OB History    No data available       Home Medications    Prior to Admission medications   Medication Sig Start Date End Date Taking? Authorizing Provider  albuterol (PROVENTIL HFA;VENTOLIN HFA) 108 (90 Base) MCG/ACT inhaler Inhale 2 puffs into the lungs every 6 (six) hours as needed for wheezing or shortness of breath. 01/27/16   Sharion Balloon, FNP  aspirin 81 MG chewable tablet Chew 81 mg by mouth daily.    Historical Provider, MD  atorvastatin (LIPITOR) 20 MG tablet TAKE ONE TABLET BY MOUTH ONCE DAILY Patient taking differently: TAKE 20 MG BY MOUTH ONCE DAILY 12/24/15   Bhavinkumar Bhagat, PA  Calcium Carb-Cholecalciferol (CALCIUM 600 + D) 600-200 MG-UNIT TABS Take 2 tablets by mouth daily.     Historical Provider, MD  carvedilol (COREG) 3.125 MG tablet TAKE ONE TABLET BY MOUTH TWICE DAILY WITH  A  MEAL Patient taking differently: TAKE 3.125 MG BY MOUTH TWICE DAILY WITH  A  MEAL 12/24/15   Bhavinkumar Bhagat, PA  gabapentin (NEURONTIN) 300 MG capsule Take 300 mg by mouth 3 (three)  times daily.  02/07/15   Historical Provider, MD  ketotifen (THERA TEARS ALLERGY) 0.025 % ophthalmic solution Apply 1 drop to eye daily as needed (for dry eye relief).    Historical Provider, MD  lisinopril (PRINIVIL,ZESTRIL) 2.5 MG tablet TAKE ONE TABLET BY MOUTH ONCE DAILY Patient taking differently: TAKE 2.5 MG BY MOUTH ONCE DAILY 12/24/15   Josue Hector, MD  lubiprostone (AMITIZA) 24 MCG capsule Take 24 mcg by mouth 2 (two) times daily with a meal.    Historical Provider, MD  magnesium oxide (MAG-OX) 400 MG tablet Take 400 mg by mouth daily.    Historical Provider, MD  Multiple Vitamin (MULTIVITAMIN) tablet Take 1 tablet by mouth daily.    Historical Provider, MD  nitroGLYCERIN (NITROSTAT) 0.4 MG SL tablet Place 1 tablet (0.4 mg total) under the tongue every 5 (five) minutes as needed for chest pain. 02/11/15   Josue Hector, MD  pantoprazole (PROTONIX) 40 MG tablet Take 40 mg by mouth 2 (two) times daily.  04/06/15   Historical Provider, MD  TraMADol HCl 300 MG TB24 Take 300 mg  by mouth daily as needed for pain. 02/03/16   Historical Provider, MD    Family History Family History  Problem Relation Age of Onset  . Hypertension Mother   . COPD Mother   . Cancer Mother     breast  . Dementia Mother   . Heart disease Father   . Cancer Father     Colorectal  . Hyperlipidemia Father   . Hypertension Father     Social History Social History  Substance Use Topics  . Smoking status: Never Smoker  . Smokeless tobacco: Never Used  . Alcohol use Yes     Comment: 12/30/2014 "might have a beer or glass of wine maybe once/month"     Allergies   Ativan [lorazepam] and Cephalexin   Review of Systems Review of Systems  Constitutional: Negative for fever.  Respiratory: Negative for shortness of breath.   Gastrointestinal: Negative for abdominal pain, constipation and vomiting.  Genitourinary: Positive for dysuria and flank pain. Negative for decreased urine volume, difficulty urinating,  hematuria, vaginal bleeding and vaginal discharge.  Musculoskeletal: Positive for back pain. Negative for joint swelling.  Skin: Negative for rash.  Neurological: Negative for dizziness, weakness and numbness.  All other systems reviewed and are negative.    Physical Exam Updated Vital Signs BP 111/80 (BP Location: Right Arm)   Pulse 87   Temp 97.9 F (36.6 C) (Oral)   Resp 16   Ht 5\' 4"  (1.626 m)   Wt 88.5 kg   LMP 05/18/2011   SpO2 95%   BMI 33.47 kg/m   Physical Exam  Constitutional: She is oriented to person, place, and time. She appears well-developed and well-nourished. No distress.  HENT:  Head: Normocephalic and atraumatic.  Neck: Normal range of motion. Neck supple.  Cardiovascular: Normal rate, regular rhythm and intact distal pulses.   No murmur heard. Pulmonary/Chest: Effort normal and breath sounds normal. No respiratory distress.  Abdominal: Soft. She exhibits no distension and no mass. There is no tenderness. There is no guarding.  No CVA tenderness  Musculoskeletal: She exhibits tenderness. She exhibits no edema.       Lumbar back: She exhibits tenderness and pain. She exhibits normal range of motion, no swelling, no deformity, no laceration and normal pulse.  ttp of the bilateral lumbar paraspinal muscles, left greater than right.  No spinal tenderness.  DP pulses are brisk and symmetrical.  Distal sensation intact.  Pt has 5/5 strength against resistance of bilateral lower extremities.     Neurological: She is alert and oriented to person, place, and time. She has normal strength. No sensory deficit. She exhibits normal muscle tone. Coordination and gait normal.  Reflex Scores:      Patellar reflexes are 2+ on the right side and 2+ on the left side.      Achilles reflexes are 2+ on the right side and 2+ on the left side. Skin: Skin is warm and dry. No rash noted.  Nursing note and vitals reviewed.    ED Treatments / Results  Labs (all labs ordered are  listed, but only abnormal results are displayed) Labs Reviewed  URINALYSIS, ROUTINE W REFLEX MICROSCOPIC (NOT AT [facility]) - Abnormal; Notable for the following:       Result Value   Hgb urine dipstick LARGE (*)    Ketones, ur 15 (*)    All other components within normal limits  CBC WITH DIFFERENTIAL/PLATELET - Abnormal; Notable for the following:    Hemoglobin 11.9 (*)  HCT 35.5 (*)    All other components within normal limits  BASIC METABOLIC PANEL - Abnormal; Notable for the following:    Glucose, Bld 100 (*)    Calcium 8.8 (*)    All other components within normal limits  URINE MICROSCOPIC-ADD ON - Abnormal; Notable for the following:    Squamous Epithelial / LPF 0-5 (*)    Bacteria, UA MANY (*)    All other components within normal limits  URINE CULTURE    EKG  EKG Interpretation None       Radiology Ct Renal Stone Study  Result Date: 04/21/2016 CLINICAL DATA:  Left side abdominal pain starting 4 a.m., left flank pain EXAM: CT ABDOMEN AND PELVIS WITHOUT CONTRAST TECHNIQUE: Multidetector CT imaging of the abdomen and pelvis was performed following the standard protocol without IV contrast. COMPARISON:  04/07/2016 FINDINGS: Lower chest: The lung bases are unremarkable. Stable tiny focal ground-glass attenuation left base anteriorly. Hepatobiliary: Unenhanced liver shows no biliary ductal dilatation. No calcified gallstones are noted within gallbladder. Pancreas: Unenhanced pancreas is unremarkable. Spleen: Unenhanced spleen is unremarkable. Adrenals/Urinary Tract: No adrenal gland mass. Again noted left nonobstructive nephrolithiasis. Largest calculus in midpole measures 3.5 mm. No hydronephrosis or hydroureter. No calcified ureteral calculi. No calcified calculi are noted within urinary bladder. Bilateral distal ureter is unremarkable. Stomach/Bowel: No small bowel obstruction. No gastric outlet obstruction. Terminal ileum is unremarkable. No pericecal inflammation. Moderate stool  noted in cecum and right colon. Moderate stool and residual contrast material noted right colon descending colon and proximal transverse colon. Mild redundant transverse colon. No colitis or diverticulitis. No distal colonic obstruction. Vascular/Lymphatic: No aortic aneurysm. No retroperitoneal or mesenteric adenopathy. Reproductive: The uterus is atrophic.  No adnexal mass. Other: No ascites or free abdominal air. Musculoskeletal: No destructive bony lesions are noted. Sagittal images of the spine shows osteopenia and degenerative changes thoracolumbar spine. IMPRESSION: 1. There is left nonobstructive nephrolithiasis. No hydronephrosis or hydroureter. No calcified ureteral calculi. 2. No calcified calculi are noted within urinary bladder. 3. Moderate colonic stool. No colitis or diverticulitis. No colonic obstruction. 4. No small bowel obstruction. 5. Degenerative changes thoracolumbar spine. Electronically Signed   By: Lahoma Crocker M.D.   On: 04/21/2016 11:10    Procedures Procedures (including critical care time)  Medications Ordered in ED Medications  morphine 4 MG/ML injection 4 mg (4 mg Intravenous Given 04/21/16 0909)  ondansetron (ZOFRAN) injection 4 mg (4 mg Intravenous Given 04/21/16 0909)  HYDROmorphone (DILAUDID) injection 1 mg (1 mg Intravenous Given 04/21/16 0940)  ketorolac (TORADOL) 30 MG/ML injection 30 mg (30 mg Intravenous Given 04/21/16 1212)     Initial Impression / Assessment and Plan / ED Course  I have reviewed the triage vital signs and the nursing notes.  Pertinent labs & imaging results that were available during my care of the patient were reviewed by me and considered in my medical decision making (see chart for details).  Clinical Course   Pt well appearing.  Witnessed ambulating up the hallway, gait was steady. No focal neuro deficits.  No concerning sx's for emergent neurological process.  Labs and CT results discussed with pt.  She has hx of constipation.  No  obstructions seen on CT.  She is feeling better after medications here and appears stable for d/c.  Agrees to take OTC laxative, advised to f/u with her urologist and continue her Cipro, urine culture pending   Final Clinical Impressions(s) / ED Diagnoses   Final diagnoses:  Acute left-sided low  back pain without sciatica  Hematuria, unspecified type  Constipation, unspecified constipation type    New Prescriptions New Prescriptions   No medications on file     Kem Parkinson, PA-C 04/21/16 1233    Julianne Rice, MD 04/22/16 [phone]

## 2016-04-22 LAB — URINE CULTURE: CULTURE: NO GROWTH

## 2016-04-24 ENCOUNTER — Encounter: Payer: Self-pay | Admitting: Pediatrics

## 2016-04-24 ENCOUNTER — Ambulatory Visit (INDEPENDENT_AMBULATORY_CARE_PROVIDER_SITE_OTHER): Payer: BLUE CROSS/BLUE SHIELD | Admitting: Pediatrics

## 2016-04-24 VITALS — BP 116/62 | HR 92 | Temp 97.5°F | Wt 187.4 lb

## 2016-04-24 DIAGNOSIS — J029 Acute pharyngitis, unspecified: Secondary | ICD-10-CM

## 2016-04-24 DIAGNOSIS — J069 Acute upper respiratory infection, unspecified: Secondary | ICD-10-CM | POA: Diagnosis not present

## 2016-04-24 DIAGNOSIS — R0789 Other chest pain: Secondary | ICD-10-CM

## 2016-04-24 DIAGNOSIS — R05 Cough: Secondary | ICD-10-CM | POA: Diagnosis not present

## 2016-04-24 DIAGNOSIS — R059 Cough, unspecified: Secondary | ICD-10-CM

## 2016-04-24 LAB — CULTURE, GROUP A STREP

## 2016-04-24 LAB — RAPID STREP SCREEN (MED CTR MEBANE ONLY): STREP GP A AG, IA W/REFLEX: NEGATIVE

## 2016-04-24 MED ORDER — GUAIFENESIN-CODEINE 100-10 MG/5ML PO SOLN: 5.0000 mL | Freq: Three times a day (TID) | ORAL | 0 refills | Status: DC | PRN

## 2016-04-24 MED ORDER — FLUTICASONE PROPIONATE 50 MCG/ACT NA SUSP: 2.0000 | Freq: Every day | NASAL | 6 refills | Status: DC

## 2016-04-24 NOTE — Patient Instructions (Addendum)
Keep water with you, use lozenges as needed Use chloroseptic or hurricane throat spray for throat pain I will send the throat swab off for a culture

## 2016-04-24 NOTE — Progress Notes (Signed)
Subjective:   Patient ID: [name], female    DOB: Jul 09, 1960, 55 y.o.   MRN: [id number] CC: Sore Throat (started this AM, sneezing, cough) and Chest Pain (this AM)  HPI: [name] is a 55 y.o. 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 Allergies and medications reviewed and updated. History  Smoking Status  . Never Smoker  Smokeless Tobacco  . Never Used   ROS: Per HPI   Objective:    BP 116/62 (BP Location: Left Arm, Patient Position: Sitting, Cuff Size: Large)   Pulse 92   Temp 97.5 F (36.4 C) (Oral)   Wt 187 lb 6.4 oz (85 kg)   LMP 05/18/2011   BMI 32.17 kg/m   Wt Readings from Last 3 Encounters:  04/24/16 187 lb 6.4 oz (85 kg)  04/21/16 195 lb (88.5 kg)  02/11/16 194 lb 12.8 oz (88.4 kg)    Gen: NAD, alert, cooperative with exam, NCAT, congested, coughing EYES: EOMI, no conjunctival injection, or no icterus ENT:  TMs dull gray b/l, OP with minimal erythema LYMPH:  no cervical LAD L side, R side with post surgical/radiation changes CV: NRRR, normal S1/S2, no murmur, distal pulses 2+ b/l Resp: CTABL, no wheezes, normal WOB Abd: +BS, soft, NTND.  Ext: No pitting edema, warm Neuro: Alert and oriented, strength equal b/l UE and LE, coordination grossly normal MSK: normal muscle bulk  EKG: NSR, unchanged from prior  Assessment & Plan:  Puneet was seen today for sore throat and chest pain.  Diagnoses and all orders for this visit:  Other chest pain EKG unchanged from prior No chest pain now Chest occurred during coughing fit, resolved with the coughing Ongoing URI symptoms Will treat URI, cough as below Discussed return precautions -     EKG 12-Lead  Sore throat Rapid strep negative, will send for culture -     Rapid strep screen (not at [facility]) -     fluticasone (FLONASE) 50 MCG/ACT nasal spray; Place 2 sprays into both nostrils daily. -     Culture, Group A Strep  Coughing Take as needed, avoid driving, will cause sleepiness -     guaiFENesin-codeine 100-10 MG/5ML syrup; Take 5-10 mLs by mouth 3 (three) times daily as needed for cough.  Acute URI Discussed symptomatic care -     guaiFENesin-codeine 100-10 MG/5ML syrup; Take 5-10 mLs by mouth 3 (three) times daily as needed for cough.   Follow up plan: Return if symptoms worsen or fail to improve. Tammy Found,  MD [place]

## 2016-04-25 ENCOUNTER — Other Ambulatory Visit: Payer: Self-pay | Admitting: Obstetrics and Gynecology

## 2016-04-25 DIAGNOSIS — Z1231 Encounter for screening mammogram for malignant neoplasm of breast: Secondary | ICD-10-CM

## 2016-04-26 LAB — CULTURE, GROUP A STREP: STREP A CULTURE: NEGATIVE

## 2016-05-15 ENCOUNTER — Other Ambulatory Visit: Payer: Self-pay | Admitting: *Deleted

## 2016-05-15 MED ORDER — LISINOPRIL 2.5 MG PO TABS: 2.5000 mg | ORAL_TABLET | Freq: Every day | ORAL | 3 refills | Status: DC

## 2016-05-15 MED ORDER — CARVEDILOL 3.125 MG PO TABS: 3.1250 mg | ORAL_TABLET | Freq: Two times a day (BID) | ORAL | 3 refills | Status: DC

## 2016-05-15 MED ORDER — ATORVASTATIN CALCIUM 20 MG PO TABS: 20.0000 mg | ORAL_TABLET | Freq: Every day | ORAL | 3 refills | Status: DC

## 2016-05-25 DIAGNOSIS — K121 Other forms of stomatitis: Secondary | ICD-10-CM | POA: Insufficient documentation

## 2016-06-17 ENCOUNTER — Other Ambulatory Visit: Payer: Self-pay | Admitting: Cardiovascular Disease

## 2016-07-13 ENCOUNTER — Ambulatory Visit
Admission: RE | Admit: 2016-07-13 | Discharge: 2016-07-13 | Disposition: A | Discharge status: Home / Self Care | Payer: BLUE CROSS/BLUE SHIELD | Source: Ambulatory Visit | Attending: Obstetrics and Gynecology | Admitting: Obstetrics and Gynecology

## 2016-07-13 DIAGNOSIS — Z1231 Encounter for screening mammogram for malignant neoplasm of breast: Secondary | ICD-10-CM

## 2016-08-17 ENCOUNTER — Encounter: Payer: Self-pay | Admitting: Nurse Practitioner

## 2016-08-17 ENCOUNTER — Ambulatory Visit (INDEPENDENT_AMBULATORY_CARE_PROVIDER_SITE_OTHER): Payer: BLUE CROSS/BLUE SHIELD | Admitting: Nurse Practitioner

## 2016-08-17 VITALS — BP 124/86 | HR 88 | Temp 97.0°F | Ht 64.0 in | Wt 197.4 lb

## 2016-08-17 DIAGNOSIS — J0100 Acute maxillary sinusitis, unspecified: Secondary | ICD-10-CM

## 2016-08-17 MED ORDER — METHYLPREDNISOLONE ACETATE 80 MG/ML IJ SUSP
80.0000 mg | Freq: Once | INTRAMUSCULAR | Status: AC
  Administered 2016-08-17: 80 mg via INTRAMUSCULAR

## 2016-08-17 MED ORDER — AZITHROMYCIN 250 MG PO TABS: ORAL_TABLET | ORAL | 0 refills | Status: DC

## 2016-08-17 MED ORDER — METHYLPREDNISOLONE ACETATE 80 MG/ML IJ SUSP: 80.0000 mg | Freq: Once | INTRAMUSCULAR | Status: DC

## 2016-08-17 NOTE — Progress Notes (Signed)
   Subjective:    Patient ID: [name], female    DOB: April 24, 1961, 56 y.o.   MRN: [id number]  HPI Patient in today c/o sore throat and cough from a tickle in throat. Right ear pain that started 2 days ago. Tried OT meds with no relief.    Review of Systems  Constitutional: Negative for chills and fever.  HENT: Positive for congestion, ear pain (right), postnasal drip, sneezing, sore throat and trouble swallowing. Negative for sinus pain and voice change.   Respiratory: Positive for cough (nonproductive).   Cardiovascular: Negative.   Gastrointestinal: Negative.   Genitourinary: Negative.   Neurological: Negative.   Psychiatric/Behavioral: Negative.   All other systems reviewed and are negative.      Objective:   Physical Exam  Constitutional: She is oriented to person, place, and time. She appears well-developed and well-nourished. No distress.  HENT:  Right Ear: Hearing, tympanic membrane, external ear and ear canal normal.  Left Ear: Hearing, tympanic membrane, external ear and ear canal normal.  Nose: Mucosal edema and rhinorrhea present. Right sinus exhibits no maxillary sinus tenderness and no frontal sinus tenderness. Left sinus exhibits no maxillary sinus tenderness and no frontal sinus tenderness.  Mouth/Throat: Uvula is midline, oropharynx is clear and moist and mucous membranes are normal.  Neck: Normal range of motion. Neck supple.  Cardiovascular: Normal rate and regular rhythm.   Pulmonary/Chest: Effort normal and breath sounds normal.  Neurological: She is alert and oriented to person, place, and time.  Skin: Skin is warm.  Psychiatric: She has a normal mood and affect. Her behavior is normal. Judgment and thought content normal.   BP 124/86   Pulse 88   Temp 97 F (36.1 C) (Oral)   Ht 5\' 4"  (1.626 m)   Wt 197 lb 6.4 oz (89.5 kg)   LMP 05/18/2011   BMI 33.88 kg/m        Assessment & Plan:  1. Acute non-recurrent maxillary sinusitis 1. Take meds  as prescribed 2. Use a cool mist humidifier especially during the winter months and when heat has been humid. 3. Use saline nose sprays frequently 4. Saline irrigations of the nose can be very helpful if done frequently.  * 4X daily for 1 week*  * Use of a nettie pot can be helpful with this. Follow directions with this* 5. Drink plenty of fluids 6. Keep thermostat turn down low 7.For any cough or congestion  Use plain Mucinex- regular strength or max strength is fine   * Children- consult with Pharmacist for dosing 8. For fever or aces or pains- take tylenol or ibuprofen appropriate for age and weight.  * for fevers greater than 101 orally you may alternate ibuprofen and tylenol every  3 hours.    - azithromycin (ZITHROMAX Z-PAK) 250 MG tablet; As directed  Dispense: 6 tablet; Refill: 0 - methylPREDNISolone acetate (DEPO-MEDROL) injection 80 mg; Inject 1 mL (80 mg total) into the muscle once.  Mary-Margaret Hassell Done, FNP

## 2016-08-17 NOTE — Patient Instructions (Signed)

## 2016-08-17 NOTE — Addendum Note (Signed)
Addended by: Jamelle Haring on: 08/17/2016 05:19 PM   Modules accepted: Orders

## 2016-08-22 ENCOUNTER — Emergency Department (HOSPITAL_COMMUNITY): Payer: BLUE CROSS/BLUE SHIELD

## 2016-08-22 ENCOUNTER — Encounter: Payer: Self-pay | Admitting: Family Medicine

## 2016-08-22 ENCOUNTER — Encounter (HOSPITAL_COMMUNITY): Payer: Self-pay | Admitting: *Deleted

## 2016-08-22 ENCOUNTER — Emergency Department (HOSPITAL_COMMUNITY)
Admission: EM | Admit: 2016-08-22 | Discharge: 2016-08-22 | Disposition: A | Discharge status: Home / Self Care | Payer: BLUE CROSS/BLUE SHIELD | Attending: Emergency Medicine | Admitting: Emergency Medicine

## 2016-08-22 ENCOUNTER — Ambulatory Visit (INDEPENDENT_AMBULATORY_CARE_PROVIDER_SITE_OTHER): Payer: BLUE CROSS/BLUE SHIELD | Admitting: Family Medicine

## 2016-08-22 VITALS — BP 130/83 | HR 88 | Temp 97.4°F | Ht 64.0 in | Wt 192.5 lb

## 2016-08-22 DIAGNOSIS — R300 Dysuria: Secondary | ICD-10-CM

## 2016-08-22 DIAGNOSIS — Z7982 Long term (current) use of aspirin: Secondary | ICD-10-CM | POA: Diagnosis not present

## 2016-08-22 DIAGNOSIS — K5900 Constipation, unspecified: Secondary | ICD-10-CM | POA: Diagnosis not present

## 2016-08-22 DIAGNOSIS — Z79899 Other long term (current) drug therapy: Secondary | ICD-10-CM | POA: Diagnosis not present

## 2016-08-22 DIAGNOSIS — I10 Essential (primary) hypertension: Secondary | ICD-10-CM | POA: Diagnosis not present

## 2016-08-22 DIAGNOSIS — J0101 Acute recurrent maxillary sinusitis: Secondary | ICD-10-CM | POA: Diagnosis not present

## 2016-08-22 DIAGNOSIS — R1011 Right upper quadrant pain: Secondary | ICD-10-CM | POA: Diagnosis present

## 2016-08-22 LAB — LIPASE, BLOOD: Lipase: 19 U/L (ref 11–51)

## 2016-08-22 LAB — COMPREHENSIVE METABOLIC PANEL
ALK PHOS: 69 U/L (ref 38–126)
ALT: 25 U/L (ref 14–54)
AST: 28 U/L (ref 15–41)
Albumin: 4.6 g/dL (ref 3.5–5.0)
Anion gap: 9 (ref 5–15)
BILIRUBIN TOTAL: 0.4 mg/dL (ref 0.3–1.2)
BUN: 27 mg/dL — ABNORMAL HIGH (ref 6–20)
CALCIUM: 9.5 mg/dL (ref 8.9–10.3)
CO2: 27 mmol/L (ref 22–32)
CREATININE: 0.83 mg/dL (ref 0.44–1.00)
Chloride: 97 mmol/L — ABNORMAL LOW (ref 101–111)
GFR calc non Af Amer: >60 mL/min (ref >60)
GLUCOSE: 88 mg/dL (ref 65–99)
Potassium: 4.4 mmol/L (ref 3.5–5.1)
SODIUM: 133 mmol/L — AB (ref 135–145)
TOTAL PROTEIN: 7.5 g/dL (ref 6.5–8.1)

## 2016-08-22 LAB — URINALYSIS, COMPLETE
BILIRUBIN UA: NEGATIVE
Glucose, UA: NEGATIVE
KETONES UA: NEGATIVE
NITRITE UA: NEGATIVE
PH UA: 5.5 (ref 5.0–7.5)
Protein, UA: NEGATIVE
SPEC GRAV UA: 1.02 (ref 1.005–1.030)
UUROB: 0.2 mg/dL (ref 0.2–1.0)

## 2016-08-22 LAB — URINALYSIS, ROUTINE W REFLEX MICROSCOPIC
Bilirubin Urine: NEGATIVE
GLUCOSE, UA: NEGATIVE mg/dL
Ketones, ur: NEGATIVE mg/dL
Leukocytes, UA: NEGATIVE
Nitrite: NEGATIVE
Protein, ur: NEGATIVE mg/dL
SPECIFIC GRAVITY, URINE: 1.009 (ref 1.005–1.030)
pH: 5 (ref 5.0–8.0)

## 2016-08-22 LAB — MICROSCOPIC EXAMINATION: RENAL EPITHEL UA: NONE SEEN /HPF

## 2016-08-22 LAB — CBC
HCT: 37.7 % (ref 36.0–46.0)
Hemoglobin: 12.9 g/dL (ref 12.0–15.0)
MCH: 30.7 pg (ref 26.0–34.0)
MCHC: 34.2 g/dL (ref 30.0–36.0)
MCV: 89.8 fL (ref 78.0–100.0)
PLATELETS: 216 10*3/uL (ref 150–400)
RBC: 4.2 MIL/uL (ref 3.87–5.11)
RDW: 12.5 % (ref 11.5–15.5)
WBC: 5.2 10*3/uL (ref 4.0–10.5)

## 2016-08-22 LAB — CK: CK TOTAL: 123 U/L (ref 38–234)

## 2016-08-22 MED ORDER — DOXYCYCLINE HYCLATE 100 MG PO TABS: 100.0000 mg | ORAL_TABLET | Freq: Two times a day (BID) | ORAL | 0 refills | Status: DC

## 2016-08-22 MED ORDER — DICYCLOMINE HCL 20 MG PO TABS: 20.0000 mg | ORAL_TABLET | Freq: Two times a day (BID) | ORAL | 0 refills | Status: DC | PRN

## 2016-08-22 MED ORDER — AMITRIPTYLINE HCL 25 MG PO TABS: 25.0000 mg | ORAL_TABLET | Freq: Every day | ORAL | 1 refills | Status: DC

## 2016-08-22 MED ORDER — DOCUSATE SODIUM 100 MG PO CAPS: 100.0000 mg | ORAL_CAPSULE | Freq: Two times a day (BID) | ORAL | 0 refills | Status: DC

## 2016-08-22 MED ORDER — PREDNISONE 20 MG PO TABS: ORAL_TABLET | ORAL | 0 refills | Status: DC

## 2016-08-22 MED ORDER — DICYCLOMINE HCL 10 MG/ML IM SOLN
20.0000 mg | Freq: Once | INTRAMUSCULAR | Status: AC
  Administered 2016-08-22: 20 mg via INTRAMUSCULAR
  Filled 2016-08-22: qty 2

## 2016-08-22 NOTE — ED Notes (Signed)
Called Lab about CK level

## 2016-08-22 NOTE — ED Notes (Signed)
Pt reports she took at Tramadol PTA, currently has a urinary tract infection, hx of kidney stones.

## 2016-08-22 NOTE — ED Triage Notes (Addendum)
Pt comes in with husband from home for abdominal pain. Pt states this pain started 2 weeks ago and it is intermittent. Pt states her pain got worse today after she ate. She has had nausea, denies vomiting or diarrhea. Pt is anxious in triage.    Pt went to PCP yesterday and is currently being treated a sinus infection and a UTI.

## 2016-08-22 NOTE — Progress Notes (Signed)
BP 130/83   Pulse 88   Temp 97.4 F (36.3 C) (Oral)   Ht 5\' 4"  (1.626 m)   Wt 192 lb 8 oz (87.3 kg)   LMP 05/18/2011   BMI 33.04 kg/m    Subjective:    Patient ID: [name], female    DOB: 05-05-1961, 56 y.o.   MRN: [id number]  HPI: [name] is a 56 y.o. female presenting on 08/22/2016 for Urinary Tract Infection (burning with urination, back pain); Sinusitis (right ear feels full, sinus drainage, sore throat; was seen last week and given Z-pak and Depo Medrol, does not feel any better); and Muscle cramps (bilateral rib cage)   HPI Dysuria and hematuria Patient has been having dysuria and hematuria that has been going on chronically for the past few years. She has seen multiple urologists and gynecologist for this issue. They hinted towards interstitial cystitis as a possibility and have done biopsies and samples and talked about doing viscous lidocaine intra vesicle but she could not afford that so she did not proceed with that. She still been having her chronic dysuria issues and left a urine for [place] today. She denies any fevers or chills or flank pain or abdominal pain, most of the dysuria is near the introitus. She denies any vaginal issues of vaginal discharge or vaginal complaints. She does keep up with her gynecologist regularly and just had an examination and they did not think anything was wrong along those Lines.  Sinus congestion Patient has been having persistent sinus congestion since being seen here in office last week. She took her azithromycin and had a Depo-Medrol shot and did not see much improvement from that. She is having pain in her lower ribs from coughing. She denies any fevers or chills or shortness of breath or wheezing. She is having pain and pressure in her right ear as well that has not improved. She denies any drainage or discharge from the ear.  Relevant past medical, surgical, family and social history reviewed and updated as indicated. Interim  medical history since our last visit reviewed. Allergies and medications reviewed and updated.  Review of Systems  Constitutional: Negative for chills and fever.  HENT: Positive for congestion, ear pain, postnasal drip, rhinorrhea, sinus pressure, sneezing and sore throat. Negative for ear discharge.   Eyes: Negative for pain, redness and visual disturbance.  Respiratory: Positive for cough. Negative for chest tightness and shortness of breath.   Cardiovascular: Negative for chest pain and leg swelling.  Gastrointestinal: Positive for abdominal pain. Negative for blood in stool, constipation, diarrhea, nausea and vomiting.  Genitourinary: Positive for frequency and hematuria. Negative for decreased urine volume, difficulty urinating, dysuria, flank pain, urgency, vaginal bleeding, vaginal discharge and vaginal pain.  Musculoskeletal: Negative for back pain and gait problem.  Skin: Negative for rash.  Neurological: Positive for headaches. Negative for light-headedness.  Psychiatric/Behavioral: Negative for agitation and behavioral problems.  All other systems reviewed and are negative.   Per HPI unless specifically indicated above     Objective:    BP 130/83   Pulse 88   Temp 97.4 F (36.3 C) (Oral)   Ht 5\' 4"  (1.626 m)   Wt 192 lb 8 oz (87.3 kg)   LMP 05/18/2011   BMI 33.04 kg/m   Wt Readings from Last 3 Encounters:  08/22/16 192 lb 8 oz (87.3 kg)  08/17/16 197 lb 6.4 oz (89.5 kg)  04/24/16 187 lb 6.4 oz (85 kg)    Physical  Exam  Constitutional: She is oriented to person, place, and time. She appears well-developed and well-nourished. No distress.  HENT:  Right Ear: Tympanic membrane, external ear and ear canal normal.  Left Ear: Tympanic membrane, external ear and ear canal normal.  Nose: Mucosal edema and rhinorrhea present. No epistaxis. Right sinus exhibits no maxillary sinus tenderness and no frontal sinus tenderness. Left sinus exhibits no maxillary sinus tenderness  and no frontal sinus tenderness.  Mouth/Throat: Uvula is midline and mucous membranes are normal. Posterior oropharyngeal edema and posterior oropharyngeal erythema present. No oropharyngeal exudate or tonsillar abscesses.  Eyes: Conjunctivae are normal.  Neck: Neck supple.  Cardiovascular: Normal rate, regular rhythm, normal heart sounds and intact distal pulses.   No murmur heard. Pulmonary/Chest: Effort normal and breath sounds normal. No respiratory distress. She has no wheezes. She has no rales.  Abdominal: Soft. Bowel sounds are normal. She exhibits no distension. There is no hepatosplenomegaly. There is no tenderness. There is no rigidity, no rebound, no guarding and no CVA tenderness.  Musculoskeletal: Normal range of motion. She exhibits no edema.  Lymphadenopathy:    She has no cervical adenopathy.  Neurological: She is alert and oriented to person, place, and time. Coordination normal.  Skin: Skin is warm and dry. No rash noted. She is not diaphoretic.  Psychiatric: She has a normal mood and affect. Her behavior is normal.  Vitals reviewed.     Assessment & Plan:   Problem List Items Addressed This Visit    None    Visit Diagnoses    Dysuria    -  Primary   Chronic dysuria, concern for interstitial cystitis, has are seen urology multiple times   Relevant Medications   amitriptyline (ELAVIL) 25 MG tablet   Other Relevant Orders   Urinalysis, Complete   Urine culture   Acute recurrent maxillary sinusitis       Relevant Medications   doxycycline (VIBRA-TABS) 100 MG tablet   predniSONE (DELTASONE) 20 MG tablet       Follow up plan: Return in about 4 weeks (around 09/19/2016), or if symptoms worsen or fail to improve, for Follow-up on amitriptyline..  Counseling provided for all of the vaccine components Orders Placed This Encounter  Procedures  . Urine culture  . Urinalysis, Complete    Caryl Pina, MD [facility] 08/22/2016, 9:01  AM

## 2016-08-22 NOTE — ED Notes (Signed)
Patient given discharge instruction, verbalized understand. Patient ambulatory out of the department.  

## 2016-08-22 NOTE — ED Provider Notes (Signed)
[facility] DEPT Provider Note   CSN: [id number] Arrival date & time: 08/22/16  1159  By signing my name below, I, Evelene Croon, attest that this documentation has been prepared under the direction and in the presence of Julianne Rice, MD . Electronically Signed: Evelene Croon, Scribe. 08/22/2016. 1:10 PM.  History   Chief Complaint Chief Complaint  Patient presents with  . Abdominal Pain     The history is provided by the patient. No language interpreter was used.   HPI Comments:  [name] is a 56 y.o. female who presents to the Emergency Department complaining of severe, intermittent, sharp, stabbing pain to the RUQ x ~ 2 weeks. She has had 2 episodes today. She states she has been told in the past that she has a sluggish gallbladder but denies h/o cholelithiasis. Her pain is occasionally exacerbated after eating. She notes mild relief of pain when she applied pressure to the site. She denies nausea, vomiting, diarrhea, constipation, melena, fever and chills. Her last normal BM was today; denies straining. She recently finished course of azithromycin and is being started on doxycycline for sinus infection.    Past Medical History:  Diagnosis Date  . Anxiety   . Arthritis    back (12/30/2014)  . Childhood asthma   . Daily headache   . Depression   . GERD (gastroesophageal reflux disease)   . History of palpitations    STRESS INDUCED  . Hypercholesterolemia dx'd 12/2014  . Kidney cysts    "I think it was right"  . Kidney stones   . Left ureteral calculus   . 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 NSTEMI (non-ST elevated myocardial infarction) ([place]) 12/28/2014  . PONV (postoperative nausea  and vomiting)   . Takotsubo syndrome    "Broken heart syndrome"    Patient Active Problem List   Diagnosis Date Noted  . Aortic atherosclerosis ([place]) 02/04/2016  . Constipation 12/29/2015  . Respiratory failure ([place]) 12/26/2015  . Hyperlipidemia 12/26/2015  . Essential (primary) hypertension 12/26/2015  . Abdominal pain 12/26/2015  . Urinary tract infection, site not specified 03/27/2015  . Cardiomyopathy ([place])   . ICM- EF 30-35% at cath 12/30/14-improved to 45% by echo 01/06/15 01/06/2015  . Orthostatic hypotension 01/06/2015  . Near syncope 01/05/2015  . IBS (irritable bowel syndrome) 01/05/2015  . Biliary dyskinesia 01/05/2015  . Recent NSTEMI ([place]ub[place] event 12/30/14) 12/29/2014  . GERD (gastroesophageal reflux disease) 03/21/2012    Past Surgical History:  Procedure Laterality Date  . BACK SURGERY    . BREAST BIOPSY Bilateral early 2000's  . CARDIAC CATHETERIZATION  2006 (APPROX)  [place]   NORMAL  . CARDIAC CATHETERIZATION  12/30/2014  . CARDIAC CATHETERIZATION N/A 12/30/2014   Procedure: Left Heart Cath and Coronary Angiography;  Surgeon: Wellington Hampshire, MD;  Location: [facility];  Service: Cardiovascular;  Laterality: N/A;  . CARDIOVASCULAR STRESS TEST  03-21-2012  DR Johnsie Cancel   NORMAL NUCLEAR STUDY/  NO ISCHEMIA/  EF 63%  . CYSTOSCOPY W/ URETERAL STENT PLACEMENT Left 03/26/2013   Procedure: CYSTOSCOPY WITH RETROGRADE PYELOGRAM ;  Surgeon: Molli Hazard, MD;  Location[facility];  Service:  Urology;  Laterality: Left;  . CYSTOSCOPY WITH RETROGRADE PYELOGRAM, URETEROSCOPY AND STENT PLACEMENT Bilateral 03/19/2013   Procedure: CYSTOSCOPY WITH RETROGRADE PYELOGRAM, BILATERAL URETEROSCOPY AND STENT PLACEMENT LEFT URETER,BILATERAL STONE EXTRACTION , HOLMIUM LASER LEFT URETER;  Surgeon: Molli Hazard, MD;  Location: [facility];  Service: Urology;  Laterality: Bilateral;  . CYSTOSCOPY WITH STENT PLACEMENT Left 03/26/2013   Procedure:  CYSTOSCOPY WITH STENT PLACEMENT;  Surgeon: Molli Hazard, MD;  Location: [facility];  Service: Urology;  Laterality: Left;  . DIAGNOSTIC LAPAROSCOPY  04-12-2009  . ESOPHAGOGASTRODUODENOSCOPY (EGD) WITH PROPOFOL N/A 02/10/2016   Procedure: ESOPHAGOGASTRODUODENOSCOPY (EGD) WITH PROPOFOL;  Surgeon: Ronald Lobo, MD;  Location: [facility];  Service: Endoscopy;  Laterality: N/A;  . KIDNEY SURGERY  1966   BILATERAL URETER'S DILATATION  . LUMBAR LAMINECTOMY/DECOMPRESSION MICRODISCECTOMY  05/18/2011   Procedure: LUMBAR LAMINECTOMY/DECOMPRESSION MICRODISCECTOMY;  Surgeon: Johnn Hai;  Location: [facility];  Service: Orthopedics;  Laterality: Right;  Decompression Lumbar 4-Lumbar 5  Right    (xray)   . MELANOMA EXCISION WITH SENTINEL LYMPH NODE BIOPSY  2001   moh's procedure/  RIGHT FOREHEAD AND UPPER EYEBROW  . RIGHT LATERAL PAROTIDECTOMY W/ NERVE DISSECTION / RIGHT MODIFIED RADICAL NECK DISSECTION SPARING SCM ELEVENTH NERVE AND INTERNAL JUGULAR VEIN  09-12-2006  DR DWIGHT BATES   DR DWIGHT BATES; "inside gland; lots of lymph nodes"    OB History    No data available       Home Medications    Prior to Admission medications   Medication Sig Start Date End Date Taking? Authorizing Provider  albuterol (PROVENTIL HFA;VENTOLIN HFA) 108 (90 Base) MCG/ACT inhaler Inhale 2 puffs into the lungs every 6 (six) hours as needed for wheezing or shortness of breath. 01/27/16  Yes Sharion Balloon, FNP  amitriptyline (ELAVIL) 25 MG tablet Take 1 tablet (25 mg total) by mouth at bedtime. 08/22/16  Yes Fransisca Kaufmann Dettinger, MD  aspirin 81 MG chewable tablet Chew 81 mg by mouth daily.   Yes Historical Provider, MD  atorvastatin (LIPITOR) 20 MG tablet Take 1 tablet (20 mg total) by mouth daily. 05/15/16  Yes Josue Hector, MD  azithromycin (ZITHROMAX) 250 MG tablet Take 250 mg by mouth daily.   Yes Historical Provider, MD  Calcium Carb-Cholecalciferol (CALCIUM 600 + D) 600-200 MG-UNIT TABS  Take 2 tablets by mouth daily.    Yes Historical Provider, MD  carvedilol (COREG) 3.125 MG tablet Take 1 tablet (3.125 mg total) by mouth 2 (two) times daily with a meal. 05/15/16  Yes Josue Hector, MD  doxycycline (VIBRA-TABS) 100 MG tablet Take 1 tablet (100 mg total) by mouth 2 (two) times daily. 1 po bid 08/22/16  Yes Fransisca Kaufmann Dettinger, MD  fluticasone (FLONASE) 50 MCG/ACT nasal spray Place 2 sprays into both nostrils daily. 04/24/16  Yes Eustaquio Maize, MD  ketotifen (THERA TEARS ALLERGY) 0.025 % ophthalmic solution Apply 1 drop to eye daily as needed (for dry eye relief).   Yes Historical Provider, MD  lisinopril (PRINIVIL,ZESTRIL) 2.5 MG tablet Take 1 tablet (2.5 mg total) by mouth daily. 05/15/16  Yes Josue Hector, MD  lubiprostone (AMITIZA) 24 MCG capsule Take 24 mcg by mouth 2 (two) times daily with a meal.   Yes Historical Provider, MD  magnesium oxide (MAG-OX) 400 MG tablet Take 400 mg by mouth daily.   Yes Historical Provider, MD  Multiple Vitamin (MULTIVITAMIN) tablet Take 1 tablet by mouth daily.   Yes Historical Provider, MD  NITROSTAT 0.4  MG SL tablet DISSOLVE ONE TABLET UNDER THE TONGUE EVERY 5 MINUTES AS NEEDED FOR CHEST PAIN.  DO NOT EXCEED A TOTAL OF 3 DOSES IN 15 MINUTES 06/21/16  Yes Josue Hector, MD  predniSONE (DELTASONE) 20 MG tablet 2 po at same time daily for 5 days 08/22/16  Yes Fransisca Kaufmann Dettinger, MD  dicyclomine (BENTYL) 20 MG tablet Take 1 tablet (20 mg total) by mouth 2 (two) times daily as needed for spasms. 08/22/16   Julianne Rice, MD  docusate sodium (COLACE) 100 MG capsule Take 1 capsule (100 mg total) by mouth every 12 (twelve) hours. 08/22/16   Julianne Rice, MD    Family History Family History  Problem Relation Age of Onset  . Hypertension Mother   . COPD Mother   . Cancer Mother     breast  . Dementia Mother   . Heart disease Father   . Cancer Father     Colorectal  . Hyperlipidemia Father   . Hypertension Father     Social History Social  History  Substance Use Topics  . Smoking status: Never Smoker  . Smokeless tobacco: Never Used  . Alcohol use Yes     Comment: 12/30/2014 "might have a beer or glass of wine maybe once/month"     Allergies   Ativan [lorazepam] and Cephalexin   Review of Systems Review of Systems  Constitutional: Negative for chills and fever.  HENT: Positive for sinus pressure. Negative for sore throat.   Respiratory: Negative for shortness of breath.   Cardiovascular: Negative for chest pain.  Gastrointestinal: Positive for abdominal pain. Negative for abdominal distention, blood in stool, constipation, diarrhea, nausea and vomiting.  Genitourinary: Positive for dysuria and hematuria. Negative for difficulty urinating, flank pain and frequency.  Musculoskeletal: Positive for back pain and myalgias. Negative for neck pain and neck stiffness.  Skin: Negative for rash and wound.  Neurological: Negative for dizziness, weakness, light-headedness, numbness and headaches.  All other systems reviewed and are negative.    Physical Exam Updated Vital Signs BP 114/75 (BP Location: Right Arm)   Pulse 78   Temp 98.1 F (36.7 C) (Oral)   Resp 18   Ht 5\' 4"  (1.626 m)   Wt 192 lb (87.1 kg)   LMP 05/18/2011   SpO2 98%   BMI 32.96 kg/m   Physical Exam  Constitutional: She is oriented to person, place, and time. She appears well-developed and well-nourished.  HENT:  Head: Normocephalic and atraumatic.  Mouth/Throat: Oropharynx is clear and moist.  Eyes: EOM are normal. Pupils are equal, round, and reactive to light.  Neck: Normal range of motion. Neck supple.  Cardiovascular: Normal rate and regular rhythm.   Pulmonary/Chest: Effort normal and breath sounds normal.  Abdominal: Soft. Bowel sounds are normal. She exhibits distension. There is tenderness. There is no rebound and no guarding.  Mildly distended abdomen especially in the bilateral upper quadrants. Patient has mild right upper quadrant  tenderness with palpation. There is no rebound or guarding.  Musculoskeletal: Normal range of motion. She exhibits no edema or tenderness.  No CVA tenderness. No midline thoracic or lumbar tenderness.  Neurological: She is alert and oriented to person, place, and time.  5/5 motor in all extremities. Sensation is fully intact.  Skin: Skin is warm and dry. Capillary refill takes less than 2 seconds. No rash noted. No erythema.  Psychiatric: She has a normal mood and affect. Her behavior is normal.  Nursing note and vitals reviewed.  ED Treatments / Results  DIAGNOSTIC STUDIES:  Oxygen Saturation is 98% on RA, normal by my interpretation.    COORDINATION OF CARE:  1:08 PM Will order imaging. Discussed treatment plan with pt at bedside and pt agreed to plan.  Labs (all labs ordered are listed, but only abnormal results are displayed) Labs Reviewed  COMPREHENSIVE METABOLIC PANEL - Abnormal; Notable for the following:       Result Value   Sodium 133 (*)    Chloride 97 (*)    BUN 27 (*)    All other components within normal limits  URINALYSIS, ROUTINE W REFLEX MICROSCOPIC - Abnormal; Notable for the following:    Color, Urine STRAW (*)    Hgb urine dipstick MODERATE (*)    Bacteria, UA RARE (*)    All other components within normal limits  LIPASE, BLOOD  CBC  CK    EKG  EKG Interpretation None       Radiology US Abdomen Limited  Result Date: 08/22/2016 CLINICAL DATA:  Right upper quadrant abdominal pain EXAM: US ABDOMEN LIMITED - RIGHT UPPER QUADRANT COMPARISON:  CT 04/21/2016, 11/06/2014 FINDINGS: Gallbladder: No gallstones or wall thickening visualized. No sonographic Murphy sign noted by sonographer. Common bile duct: Diameter: 3.3 mm Liver: No focal lesion identified. Increased echogenicity consistent with fatty infiltration IMPRESSION: 1. Negative for cholelithiasis or acute cholecystitis. No biliary dilatation 2. Fatty infiltration of the liver Electronically Signed    By: Donavan Foil M.D.   On: 08/22/2016 14:01   Dg Abd Acute W/chest  Result Date: 08/22/2016 CLINICAL DATA:  Abdominal pain for 2 weeks EXAM: DG ABDOMEN ACUTE W/ 1V CHEST COMPARISON:  None. FINDINGS: Cardiac shadow is within normal limits. The lungs are well aerated bilaterally. No bony abnormality is seen. Fecal material is noted throughout the colon consistent with significant constipation. No obstructive changes are seen. No free air is noted. The bony structures are within normal limits. No definitive calcifications are seen. IMPRESSION: Changes of constipation. Electronically Signed   By: Inez Catalina M.D.   On: 08/22/2016 14:07    Procedures Procedures (including critical care time)  Medications Ordered in ED Medications  dicyclomine (BENTYL) injection 20 mg (20 mg Intramuscular Given 08/22/16 1654)     Initial Impression / Assessment and Plan / ED Course  I have reviewed the triage vital signs and the nursing notes.  Pertinent labs & imaging results that were available during my care of the patient were reviewed by me and considered in my medical decision making (see chart for details).    Ultrasound without evidence of cholelithiasis. Plain x-rays with no evidence of obstruction. Significant stool throughout colon consistent with constipation. Givens patient's spasm-like symptoms this is likely the cause of her pain. We'll start on bowel regimen. Advised to follow-up with gastroenterology. Return precautions given.   Final Clinical Impressions(s) / ED Diagnoses   Final diagnoses:  Constipation, unspecified constipation type    New Prescriptions Discharge Medication List as of 08/22/2016  5:27 PM    START taking these medications   Details  dicyclomine (BENTYL) 20 MG tablet Take 1 tablet (20 mg total) by mouth 2 (two) times daily as needed for spasms., Starting Tue 08/22/2016, Print    docusate sodium (COLACE) 100 MG capsule Take 1 capsule (100 mg total) by mouth every 12  (twelve) hours., Starting Tue 08/22/2016, Print       I personally performed the services described in this documentation, which was scribed in my presence. The recorded  information has been reviewed and is accurate.       Julianne Rice, MD 08/24/16 [phone]

## 2016-08-24 LAB — URINE CULTURE

## 2016-09-08 DIAGNOSIS — Z029 Encounter for administrative examinations, unspecified: Secondary | ICD-10-CM

## 2016-09-22 ENCOUNTER — Ambulatory Visit (INDEPENDENT_AMBULATORY_CARE_PROVIDER_SITE_OTHER): Payer: BLUE CROSS/BLUE SHIELD | Admitting: Family Medicine

## 2016-09-22 ENCOUNTER — Encounter: Payer: Self-pay | Admitting: Family Medicine

## 2016-09-22 VITALS — BP 132/83 | HR 85 | Temp 97.6°F | Ht 64.0 in | Wt 194.0 lb

## 2016-09-22 DIAGNOSIS — N3010 Interstitial cystitis (chronic) without hematuria: Secondary | ICD-10-CM | POA: Diagnosis not present

## 2016-09-22 MED ORDER — AMITRIPTYLINE HCL 50 MG PO TABS: 50.0000 mg | ORAL_TABLET | Freq: Every day | ORAL | 3 refills | Status: DC

## 2016-09-22 NOTE — Progress Notes (Signed)
BP 132/83   Pulse 85   Temp 97.6 F (36.4 C) (Oral)   Ht 5\' 4"  (1.626 m)   Wt 194 lb (88 kg)   LMP 05/18/2011   BMI 33.30 kg/m    Subjective:    Patient ID: [name], female    DOB: 11/25/1960, 56 y.o.   MRN: [id number]  HPI: [name] is a 56 y.o. female presenting on 09/22/2016 for Medication followup (Amitriptyline)   HPI Bladder spasms and dysuria Patient has been having bladder spasms and dysuria that has been going on for some time. She has been coming in with frequent symptoms of UTI and often culture negative. She has been having issues with this for at least a couple years. 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 She denies any blood in her urine but does have some urinary frequency normally. We placed her on amitriptyline one month ago and she feels like it has greatly improved and she is not having the bladder spasms as much she was having. She does still experience some burning but is doing much better with this. She denies any side effects such as dry mouth or constipation more than she normally has with amitriptyline. She does take it at night and she sleeps well with it but it does not make her groggy in the morning.  Relevant past medical, surgical, family and social history reviewed and updated as indicated. Interim medical history since our last visit reviewed. Allergies and medications reviewed and updated.  Review of Systems  Constitutional: Negative for chills and fever.  Eyes: Negative for redness and visual disturbance.  Respiratory: Negative for chest tightness and shortness of breath.   Cardiovascular: Negative for chest pain and leg swelling.  Gastrointestinal: Negative for abdominal pain.  Genitourinary: Positive for dysuria and frequency. Negative for difficulty urinating, hematuria, urgency, vaginal bleeding, vaginal discharge and vaginal pain.  Musculoskeletal: Negative for back  pain and gait problem.  Skin: Negative for rash.  Neurological: Negative for light-headedness and headaches.  Psychiatric/Behavioral: Negative for agitation and behavioral problems.  All other systems reviewed and are negative.   Per HPI unless specifically indicated above        Objective:    BP 132/83   Pulse 85   Temp 97.6 F (36.4 C) (Oral)   Ht 5\' 4"  (1.626 m)   Wt 194 lb (88 kg)   LMP 05/18/2011   BMI 33.30 kg/m   Wt Readings from Last 3 Encounters:  09/22/16 194 lb (88 kg)  08/22/16 192 lb (87.1 kg)  08/22/16 192 lb 8 oz (87.3 kg)    Physical Exam  Constitutional: She is oriented to person, place, and time. She appears well-developed and well-nourished. No distress.  Eyes: Conjunctivae are normal.  Cardiovascular: Normal rate, regular rhythm, normal heart sounds and intact distal pulses.   No murmur heard. Pulmonary/Chest: Effort normal and breath sounds normal. No respiratory distress. She has no wheezes. She has no rales.  Abdominal: Soft. Bowel sounds are normal. She exhibits no distension. There is no tenderness. There is no rebound, no guarding and no CVA tenderness.  Musculoskeletal: Normal range of motion. She exhibits no edema or tenderness.  Neurological: She is alert and oriented to person, place, and time. Coordination normal.  Skin: Skin is warm and dry. No rash noted. She is not diaphoretic.  Psychiatric: She has a normal mood and affect. Her behavior is normal.  Nursing  note and vitals reviewed.     Assessment & Plan:   Problem List Items Addressed This Visit    None    Visit Diagnoses    Interstitial cystitis    -  Primary   Relevant Medications   amitriptyline (ELAVIL) 50 MG tablet       Follow up plan: Return in about 3 months (around 12/22/2016), or if symptoms worsen or fail to improve, for Follow-up interstitial cytosine allergies..  Counseling provided for all of the vaccine components No orders of the defined types were placed in  this encounter.   Caryl Pina, MD [facility] 09/22/2016, 8:36 AM

## 2016-10-13 ENCOUNTER — Encounter: Payer: Self-pay | Admitting: Family Medicine

## 2016-10-13 ENCOUNTER — Ambulatory Visit (INDEPENDENT_AMBULATORY_CARE_PROVIDER_SITE_OTHER): Payer: BLUE CROSS/BLUE SHIELD | Admitting: Family Medicine

## 2016-10-13 VITALS — BP 136/67 | HR 82 | Temp 97.0°F | Ht 64.0 in | Wt 197.0 lb

## 2016-10-13 DIAGNOSIS — R300 Dysuria: Secondary | ICD-10-CM

## 2016-10-13 DIAGNOSIS — H6121 Impacted cerumen, right ear: Secondary | ICD-10-CM

## 2016-10-13 LAB — MICROSCOPIC EXAMINATION
BACTERIA UA: NONE SEEN
Renal Epithel, UA: NONE SEEN /hpf

## 2016-10-13 LAB — URINALYSIS, COMPLETE
Bilirubin, UA: NEGATIVE
Glucose, UA: NEGATIVE
Ketones, UA: NEGATIVE
LEUKOCYTES UA: NEGATIVE
Nitrite, UA: NEGATIVE
PH UA: 7 (ref 5.0–7.5)
Protein, UA: NEGATIVE
SPEC GRAV UA: 1.015 (ref 1.005–1.030)
Urobilinogen, Ur: 0.2 mg/dL (ref 0.2–1.0)

## 2016-10-13 NOTE — Progress Notes (Signed)
BP 136/67   Pulse 82   Temp 97 F (36.1 C) (Oral)   Ht 5\' 4"  (1.626 m)   Wt 197 lb (89.4 kg)   LMP 05/18/2011   BMI 33.81 kg/m    Subjective:    Patient ID: [name], female    DOB: 1961/05/24, 56 y.o.   MRN: [id number]  HPI: [name] is a 56 y.o. female presenting on 10/13/2016 for Urinary Tract Infection (dysuria, back pain); Right ear pain; and Discuss Amitriptyline (can she 1/2 medication, increased dosage causing dry mouth, excessive sleepiness)   HPI Dysuria and low back pain on right side Patient has been having dysuria and low back pain on the right side that has been bothering her more over the past few days. She had been taking amitriptyline for her interstitial cystitis but then had to stop it because she was getting too much dry mouth. She would like to go back on the half dose of it for this. She denies any fevers or chills. She denies any abdominal pain or urinary frequency. She denies any blood visible in her urine. This is a chronic issue that she fights off and on.  Right ear pain and pressure She has been having right ear pain and pressure that has been increased over the past couple weeks. She has seen ENT many times for this before and they had told her to use baby oil drops in her ear to help with the wax removal. She has a lot of issues because of some trauma that she had to the right side of her head along time ago. She has not been using alcohol in her ear and she feels like she started to get some pressure in the ear. She also does admit that she has a cavity on the right side of her mouth as well and does not know if the pain might be related to that. She denies any fevers or chills or cough or congestion or nasal pressure or sinus pressure.  Relevant past medical, surgical, family and social history reviewed and updated as indicated. Interim medical history since our last visit reviewed. Allergies and medications reviewed and updated.  Review of  Systems  Constitutional: Negative for chills and fever.  HENT: Positive for ear pain. Negative for congestion, ear discharge, sinus pain, sinus pressure, sneezing and sore throat.   Eyes: Negative for redness and visual disturbance.  Respiratory: Negative for chest tightness and shortness of breath.   Cardiovascular: Negative for chest pain and leg swelling.  Gastrointestinal: Negative for abdominal distention, abdominal pain, blood in stool, constipation, diarrhea, nausea and vomiting.  Genitourinary: Positive for dysuria. Negative for difficulty urinating.  Musculoskeletal: Positive for back pain. Negative for gait problem.  Skin: Negative for rash.  Neurological: Negative for light-headedness and headaches.  Psychiatric/Behavioral: Negative for agitation and behavioral problems.  All other systems reviewed and are negative.   Per HPI unless specifically indicated above        Objective:    BP 136/67   Pulse 82   Temp 97 F (36.1 C) (Oral)   Ht 5\' 4"  (1.626 m)   Wt 197 lb (89.4 kg)   LMP 05/18/2011   BMI 33.81 kg/m   Wt Readings from Last 3 Encounters:  10/13/16 197 lb (89.4 kg)  09/22/16 194 lb (88 kg)  08/22/16 192 lb (87.1 kg)    Physical Exam  Constitutional: She is oriented to person, place, and time. She appears well-developed and well-nourished.  No distress.  Eyes: Conjunctivae are normal.  Neck: Neck supple. No thyromegaly present.  Cardiovascular: Normal rate, regular rhythm, normal heart sounds and intact distal pulses.   No murmur heard. Pulmonary/Chest: Effort normal and breath sounds normal. No respiratory distress. She has no wheezes. She has no rales.  Abdominal: Soft. Bowel sounds are normal. She exhibits no distension. There is no hepatosplenomegaly. There is no tenderness. There is no rebound, no guarding and no CVA tenderness.  Musculoskeletal: Normal range of motion. She exhibits no edema.       Lumbar back: She exhibits tenderness.        Back:  Lymphadenopathy:    She has no cervical adenopathy.  Neurological: She is alert and oriented to person, place, and time. Coordination normal.  Skin: Skin is warm and dry. No rash noted. She is not diaphoretic.  Psychiatric: She has a normal mood and affect. Her behavior is normal.  Nursing note and vitals reviewed.  Urinalysis: 0-5 wbc's, 3-10 RBCs, 0-5 epithelial cells, 2+ blood    Assessment & Plan:   Problem List Items Addressed This Visit    None    Visit Diagnoses    Dysuria    -  Primary   Patient is having dysuria and has some blood in urine, difficulty telling it's interstitial cystitis versus infection, will run culture, will do urology referra   Relevant Orders   Urinalysis, Complete   Urine culture   Ambulatory referral to Urology   Excessive cerumen in ear canal, right       Nurse to lavage, if does not work and patient will go to ENT.       Follow up plan: Return in about 3 months (around 01/12/2017), or if symptoms worsen or fail to improve, for Recheck amitriptyline.  Counseling provided for all of the vaccine components Orders Placed This Encounter  Procedures  . Urine culture  . Urinalysis, Complete  . Ambulatory referral to Urology    Caryl Pina, MD [facility] 10/13/2016, 9:41 AM

## 2016-10-14 LAB — URINE CULTURE

## 2016-10-19 ENCOUNTER — Telehealth: Payer: Self-pay | Admitting: Family Medicine

## 2016-10-19 NOTE — Telephone Encounter (Signed)
Patient wanting urine culture results. Please review. Also patient states that she started her amitriptyline back last Saturday and her dysuria is worse. Please advise.

## 2016-10-19 NOTE — Telephone Encounter (Signed)
Patient aware and verbalizes understanding. 

## 2016-10-19 NOTE — Telephone Encounter (Signed)
Urine culture did not grow much of anything. The amitriptyline takes at least 2 weeks to have effect so I would continue it. If things worsen come back in and we can always run another urine culture.

## 2016-11-14 ENCOUNTER — Encounter: Payer: Self-pay | Admitting: Nurse Practitioner

## 2016-11-14 ENCOUNTER — Ambulatory Visit (INDEPENDENT_AMBULATORY_CARE_PROVIDER_SITE_OTHER): Payer: BLUE CROSS/BLUE SHIELD | Admitting: Nurse Practitioner

## 2016-11-14 VITALS — BP 115/71 | HR 76 | Temp 97.8°F | Ht 64.0 in | Wt 189.2 lb

## 2016-11-14 DIAGNOSIS — S61012A Laceration without foreign body of left thumb without damage to nail, initial encounter: Secondary | ICD-10-CM

## 2016-11-14 NOTE — Patient Instructions (Signed)
Laceration Care, Adult A laceration is a cut that goes through all layers of the skin. The cut also goes into the tissue that is right under the skin. Some cuts heal on their own. Others need to be closed with stitches (sutures), staples, skin adhesive strips, or wound glue. Taking care of your cut lowers your risk of infection and helps your cut to heal better. How to take care of your cut For stitches or staples:   Keep the wound clean and dry.  If you were given a bandage (dressing), you should change it at least one time per day or as told by your doctor. You should also change it if it gets wet or dirty.  Keep the wound completely dry for the first 24 hours or as told by your doctor. After that time, you may take a shower or a bath. However, make sure that the wound is not soaked in water until after the stitches or staples have been removed.  Clean the wound one time each day or as told by your doctor:  Wash the wound with soap and water.  Rinse the wound with water until all of the soap comes off.  Pat the wound dry with a clean towel. Do not rub the wound.  After you clean the wound, put a thin layer of antibiotic ointment on it as told by your doctor. This ointment:  Helps to prevent infection.  Keeps the bandage from sticking to the wound.  Have your stitches or staples removed as told by your doctor. If your doctor used skin adhesive strips:   Keep the wound clean and dry.  If you were given a bandage, you should change it at least one time per day or as told by your doctor. You should also change it if it gets dirty or wet.  Do not get the skin adhesive strips wet. You can take a shower or a bath, but be careful to keep the wound dry.  If the wound gets wet, pat it dry with a clean towel. Do not rub the wound.  Skin adhesive strips fall off on their own. You can trim the strips as the wound heals. Do not remove any strips that are still stuck to the wound. They will  fall off after a while. If your doctor used wound glue:   Try to keep your wound dry, but you may briefly wet it in the shower or bath. Do not soak the wound in water, such as by swimming.  After you take a shower or a bath, gently pat the wound dry with a clean towel. Do not rub the wound.  Do not do any activities that will make you really sweaty until the skin glue has fallen off on its own.  Do not apply liquid, cream, or ointment medicine to your wound while the skin glue is still on.  If you were given a bandage, you should change it at least one time per day or as told by your doctor. You should also change it if it gets dirty or wet.  If a bandage is placed over the wound, do not let the tape for the bandage touch the skin glue.  Do not pick at the glue. The skin glue usually stays on for 5-10 days. Then, it falls off of the skin. General Instructions   To help prevent scarring, make sure to cover your wound with sunscreen whenever you are outside after stitches are removed, after adhesive   strips are removed, or when wound glue stays in place and the wound is healed. Make sure to wear a sunscreen of at least 30 SPF.  Take over-the-counter and prescription medicines only as told by your doctor.  If you were given antibiotic medicine or ointment, take or apply it as told by your doctor. Do not stop using the antibiotic even if your wound is getting better.  Do not scratch or pick at the wound.  Keep all follow-up visits as told by your doctor. This is important.  Check your wound every day for signs of infection. Watch for:  Redness, swelling, or pain.  Fluid, blood, or pus.  Raise (elevate) the injured area above the level of your heart while you are sitting or lying down, if possible. Get help if:  You got a tetanus shot and you have any of these problems at the injection site:  Swelling.  Very bad pain.  Redness.  Bleeding.  You have a fever.  A wound that  was closed breaks open.  You notice a bad smell coming from your wound or your bandage.  You notice something coming out of the wound, such as wood or glass.  Medicine does not help your pain.  You have more redness, swelling, or pain at the site of your wound.  You have fluid, blood, or pus coming from your wound.  You notice a change in the color of your skin near your wound.  You need to change the bandage often because fluid, blood, or pus is coming from the wound.  You start to have a new rash.  You start to have numbness around the wound. Get help right away if:  You have very bad swelling around the wound.  Your pain suddenly gets worse and is very bad.  You notice painful lumps near the wound or on skin that is anywhere on your body.  You have a red streak going away from your wound.  The wound is on your hand or foot and you cannot move a finger or toe like you usually can.  The wound is on your hand or foot and you notice that your fingers or toes look pale or bluish. This information is not intended to replace advice given to you by your health care provider. Make sure you discuss any questions you have with your health care provider. Document Released: 11/22/2007 Document Revised: 11/11/2015 Document Reviewed: 06/01/2014 Elsevier Interactive Patient Education  2017 [employer] Inc[employer]  

## 2016-11-14 NOTE — Progress Notes (Signed)
   Subjective:    Patient ID: [name], female    DOB: 1961-04-30, 56 y.o.   MRN: [id number]  HPI Patient in today stating she was slicing potatoes this morning and cut here left thumb. No numbness- Tetanus UTD.    Review of Systems  Respiratory: Negative.   Cardiovascular: Negative.   Neurological: Negative.   Psychiatric/Behavioral: Negative.   All other systems reviewed and are negative.      Objective:   Physical Exam  Constitutional: She appears well-developed and well-nourished. No distress.  Cardiovascular: Normal rate and regular rhythm.   Pulmonary/Chest: Effort normal and breath sounds normal.  Skin: Skin is warm.  Laceration to left thumb.     Verbal consent given by patient. Local anesthesia Lidocaine 2% without 39ml Betadine prep 4-0 ethilon suture-4 simple interrupted stitches Cleaned with Saline Antibiotic ointment Dressing applied       Assessment & Plan:   1. Laceration of left thumb without foreign body without damage to nail, initial encounter    Keep clean and dry Watch for signs of infection Suture removal in 10 days RTO prn  Mary-Margaret Hassell Done, FNP

## 2016-11-17 NOTE — Progress Notes (Signed)
Letter unsent- error

## 2016-11-24 ENCOUNTER — Encounter: Payer: Self-pay | Admitting: Nurse Practitioner

## 2016-11-24 ENCOUNTER — Ambulatory Visit (INDEPENDENT_AMBULATORY_CARE_PROVIDER_SITE_OTHER): Payer: BLUE CROSS/BLUE SHIELD | Admitting: Nurse Practitioner

## 2016-11-24 VITALS — BP 125/77 | HR 72 | Temp 97.4°F | Ht 64.0 in | Wt 189.6 lb

## 2016-11-24 DIAGNOSIS — Z4802 Encounter for removal of sutures: Secondary | ICD-10-CM

## 2016-11-24 NOTE — Progress Notes (Signed)
   Subjective:    Patient ID: [name], female    DOB: 1960-08-16, 56 y.o.   MRN: [id number]  HPI Patient was seen on 11/14/16 with laceration to left thumb. Suture were inserted and patient is here today to have sutures removed.    Review of Systems  Constitutional: Negative.   HENT: Negative.   Respiratory: Negative.   Cardiovascular: Negative.   Musculoskeletal: Negative.   Neurological: Negative.   Psychiatric/Behavioral: Negative.   All other systems reviewed and are negative.      Objective:   Physical Exam  Constitutional: She is oriented to person, place, and time. She appears well-developed and well-nourished.  Cardiovascular: Normal rate and regular rhythm.   Pulmonary/Chest: Effort normal and breath sounds normal.  Neurological: She is alert and oriented to person, place, and time.  Skin: Skin is warm.  Wound edges well approximated Sutures removed  Psychiatric: She has a normal mood and affect. Her behavior is normal. Judgment and thought content normal.   BP 125/77   Pulse 72   Temp 97.4 F (36.3 C) (Oral)   Ht 5\' 4"  (1.626 m)   Wt 189 lb 9.6 oz (86 kg)   LMP 05/18/2011   BMI 32.54 kg/m      Assessment & Plan:   1. Visit for suture removal    No special care needed Follow up prn  Mary-Margaret Hassell Done, FNP

## 2016-12-28 ENCOUNTER — Ambulatory Visit (INDEPENDENT_AMBULATORY_CARE_PROVIDER_SITE_OTHER): Payer: BLUE CROSS/BLUE SHIELD | Admitting: Family Medicine

## 2016-12-28 ENCOUNTER — Encounter (INDEPENDENT_AMBULATORY_CARE_PROVIDER_SITE_OTHER): Payer: Self-pay

## 2016-12-28 ENCOUNTER — Encounter: Payer: Self-pay | Admitting: Family Medicine

## 2016-12-28 VITALS — BP 138/72 | HR 74 | Temp 98.0°F | Ht 64.0 in | Wt 187.0 lb

## 2016-12-28 DIAGNOSIS — I10 Essential (primary) hypertension: Secondary | ICD-10-CM | POA: Diagnosis not present

## 2016-12-28 DIAGNOSIS — Z1159 Encounter for screening for other viral diseases: Secondary | ICD-10-CM | POA: Diagnosis not present

## 2016-12-28 DIAGNOSIS — N200 Calculus of kidney: Secondary | ICD-10-CM

## 2016-12-28 DIAGNOSIS — Z114 Encounter for screening for human immunodeficiency virus [HIV]: Secondary | ICD-10-CM

## 2016-12-28 DIAGNOSIS — R739 Hyperglycemia, unspecified: Secondary | ICD-10-CM

## 2016-12-28 DIAGNOSIS — E782 Mixed hyperlipidemia: Secondary | ICD-10-CM

## 2016-12-28 LAB — URINALYSIS, COMPLETE
Bilirubin, UA: NEGATIVE
Glucose, UA: NEGATIVE
Ketones, UA: NEGATIVE
NITRITE UA: NEGATIVE
PH UA: 6 (ref 5.0–7.5)
PROTEIN UA: NEGATIVE
Specific Gravity, UA: 1.015 (ref 1.005–1.030)
UUROB: 0.2 mg/dL (ref 0.2–1.0)

## 2016-12-28 LAB — MICROSCOPIC EXAMINATION: RENAL EPITHEL UA: NONE SEEN /HPF

## 2016-12-28 MED ORDER — HYDROCODONE-ACETAMINOPHEN 10-325 MG PO TABS: 1.0000 | ORAL_TABLET | Freq: Three times a day (TID) | ORAL | 0 refills | Status: DC | PRN

## 2016-12-28 NOTE — Progress Notes (Signed)
BP 138/72   Pulse 74   Temp 98 F (36.7 C) (Oral)   Ht _0  (1.626 m)   Wt 187 lb (84.8 kg)   LMP 05/18/2011   BMI 32.10 kg/m    Subjective:    Patient ID: [name], female    DOB: Jun 12, 1961, 56 y.o.   MRN: [id number]  HPI: [name] is a 56 y.o. female presenting on 12/28/2016 for Follow-up (pt here today for routine check up and also c/o left flank pain and is worried about kidney stones as she has had them throughout her life.)   HPI Hyperlipidemia Patient is coming in for recheck of his hyperlipidemia. The patient is currently taking Lipitor. They deny any issues with myalgias or history of liver damage from it. They deny any focal numbness or weakness or chest pain.   Hypertension Patient is currently on lisinopril and Coreg, and their blood pressure today is 138/72. Patient denies any lightheadedness or dizziness. Patient denies headaches, blurred vision, chest pains, shortness of breath, or weakness. Denies any side effects from medication and is content with current medication.   Flank pain, left Patient comes in today with complaints of left flank pain that is worsening over the past few days. Patient has been having left flank pain that is associated with when she usually gets kidney stones. She is starting to feel some nausea associated with that. She does not feel the pain coming around into her groin at this point like usually ends up coming. She denies any fevers or chills. She has been having some urinary frequency but no dysuria to this point. She denies any hematuria to this point. Her last bout of kidney stones was about 2 years ago  Relevant past medical, surgical, family and social history reviewed and updated as indicated. Interim medical history since our last visit reviewed. Allergies and medications reviewed and updated.  Review of Systems  Constitutional: Negative for chills and fever.  HENT: Negative for congestion, ear discharge and ear  pain.   Eyes: Negative for redness and visual disturbance.  Respiratory: Negative for chest tightness and shortness of breath.   Cardiovascular: Negative for chest pain and leg swelling.  Gastrointestinal: Positive for nausea. Negative for abdominal pain and blood in stool.  Genitourinary: Positive for flank pain and frequency. Negative for decreased urine volume, difficulty urinating, dysuria, hematuria and pelvic pain.  Musculoskeletal: Negative for back pain and gait problem.  Skin: Negative for rash.  Neurological: Negative for light-headedness and headaches.  Psychiatric/Behavioral: Negative for agitation and behavioral problems.  All other systems reviewed and are negative.  Per HPI unless specifically indicated above      Objective:    BP 138/72   Pulse 74   Temp 98 F (36.7 C) (Oral)   Ht _1  (1.626 m)   Wt 187 lb (84.8 kg)   LMP 05/18/2011   BMI 32.10 kg/m   Wt Readings from Last 3 Encounters:  12/28/16 187 lb (84.8 kg)  11/24/16 189 lb 9.6 oz (86 kg)  11/14/16 189 lb 3.2 oz (85.8 kg)    Physical Exam  Constitutional: She is oriented to person, place, and time. She appears well-developed and well-nourished. No distress.  HENT:  Right Ear: External ear normal.  Left Ear: External ear normal.  Nose: Nose normal.  Mouth/Throat: No oropharyngeal exudate.  Eyes: Conjunctivae are normal.  Neck: Neck supple. No thyromegaly present.  Cardiovascular: Normal rate, regular rhythm, normal heart sounds and intact distal  pulses.   No murmur heard. Pulmonary/Chest: Effort normal and breath sounds normal. No respiratory distress. She has no wheezes.  Abdominal: Soft. Bowel sounds are normal. She exhibits no distension. There is no tenderness (No reproducible tenderness on exam or CVA tenderness on exam). There is no rebound and no guarding.  Musculoskeletal: Normal range of motion. She exhibits no edema or tenderness.  Lymphadenopathy:    She has no cervical adenopathy.    Neurological: She is alert and oriented to person, place, and time. Coordination normal.  Skin: Skin is warm and dry. No rash noted. She is not diaphoretic.  Psychiatric: She has a normal mood and affect. Her behavior is normal.  Nursing note and vitals reviewed.  Urinalysis: Trace leukocytes, 3-10 RBCs, 2+ blood, 0-10 epithelial cells, few bacteria. Otherwise normal    Assessment & Plan:   Problem List Items Addressed This Visit      Cardiovascular and Mediastinum   Essential (primary) hypertension (Chronic)   Relevant Orders   CMP14+EGFR     Other   Hyperlipidemia - Primary (Chronic)   Relevant Orders   Lipid panel    Other Visit Diagnoses    Renal stone       Relevant Medications   HYDROcodone-acetaminophen (NORCO) 10-325 MG tablet   Other Relevant Orders   Urinalysis, Complete   Need for hepatitis C screening test       Relevant Orders   Hepatitis C antibody   Screening for HIV without presence of risk factors       Relevant Orders   HIV antibody      Follow up plan: Return in about 6 months (around 06/30/2017), or if symptoms worsen or fail to improve, for Recheck cholesterol and hypertension.  Counseling provided for all of the vaccine components Orders Placed This Encounter  Procedures  . Urinalysis, Complete  . CMP14+EGFR  . Lipid panel  . HIV antibody  . Hepatitis C antibody    Caryl Pina, MD [facility] 12/28/2016, 9:46 AM

## 2016-12-29 LAB — CMP14+EGFR
A/G RATIO: 2 (ref 1.2–2.2)
ALT: 19 IU/L (ref 0–32)
AST: 26 IU/L (ref 0–40)
Albumin: 4.5 g/dL (ref 3.5–5.5)
Alkaline Phosphatase: 60 IU/L (ref 39–117)
BUN/Creatinine Ratio: 23 (ref 9–23)
BUN: 18 mg/dL (ref 6–24)
Bilirubin Total: 0.3 mg/dL (ref 0.0–1.2)
CALCIUM: 9.2 mg/dL (ref 8.7–10.2)
CHLORIDE: 104 mmol/L (ref 96–106)
CO2: 23 mmol/L (ref 20–29)
Creatinine, Ser: 0.8 mg/dL (ref 0.57–1.00)
GFR calc Af Amer: 94 mL/min/{1.73_m2} (ref >59)
GFR, EST NON AFRICAN AMERICAN: 82 mL/min/{1.73_m2} (ref >59)
GLUCOSE: 112 mg/dL — AB (ref 65–99)
Globulin, Total: 2.2 g/dL (ref 1.5–4.5)
POTASSIUM: 4.5 mmol/L (ref 3.5–5.2)
Sodium: 145 mmol/L — ABNORMAL HIGH (ref 134–144)
TOTAL PROTEIN: 6.7 g/dL (ref 6.0–8.5)

## 2016-12-29 LAB — LIPID PANEL
CHOL/HDL RATIO: 3.3 ratio (ref 0.0–4.4)
Cholesterol, Total: 211 mg/dL — ABNORMAL HIGH (ref 100–199)
HDL: 64 mg/dL (ref >39)
LDL Calculated: 133 mg/dL — ABNORMAL HIGH (ref 0–99)
TRIGLYCERIDES: 70 mg/dL (ref 0–149)
VLDL CHOLESTEROL CAL: 14 mg/dL (ref 5–40)

## 2016-12-29 LAB — HIV ANTIBODY (ROUTINE TESTING W REFLEX): HIV Screen 4th Generation wRfx: NONREACTIVE

## 2016-12-29 LAB — HEPATITIS C ANTIBODY: Hep C Virus Ab: <0.1 s/co ratio (ref 0.0–0.9)

## 2017-01-03 LAB — BAYER DCA HB A1C WAIVED: HB A1C: 5.5 % (ref <7.0)

## 2017-01-03 NOTE — Addendum Note (Signed)
Addended by: Marylin Crosby on: 01/03/2017 04:06 PM   Modules accepted: Orders

## 2017-02-24 IMAGING — CR DG CHEST 2V
2 series · 2 of 2 positions shown · non-contrast
Comparison: 01/02/2016.

CLINICAL DATA: Mid chest pain since [DATE] p.m. today. Dizziness and
shortness of breath.

EXAM:
CHEST  2 VIEW

[chest pa]
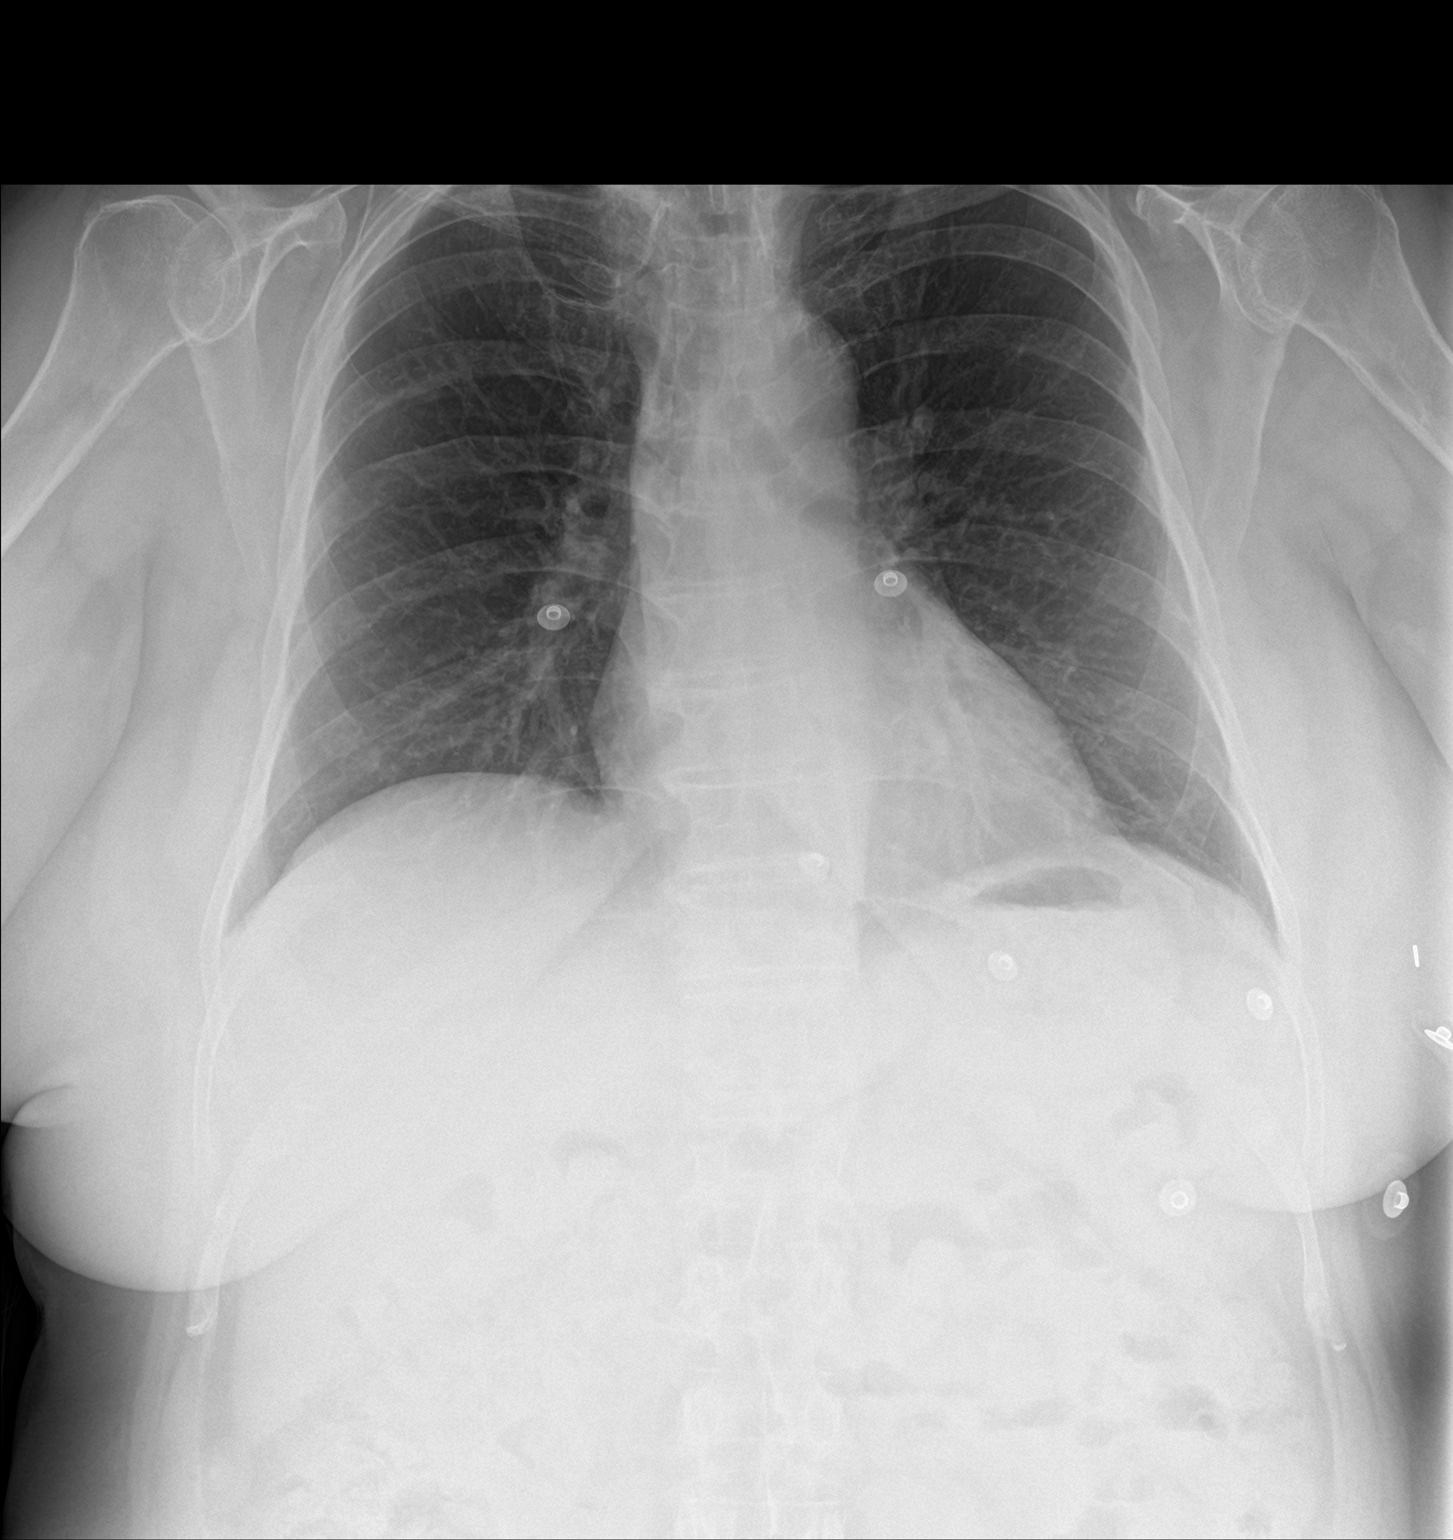

[chest lat]
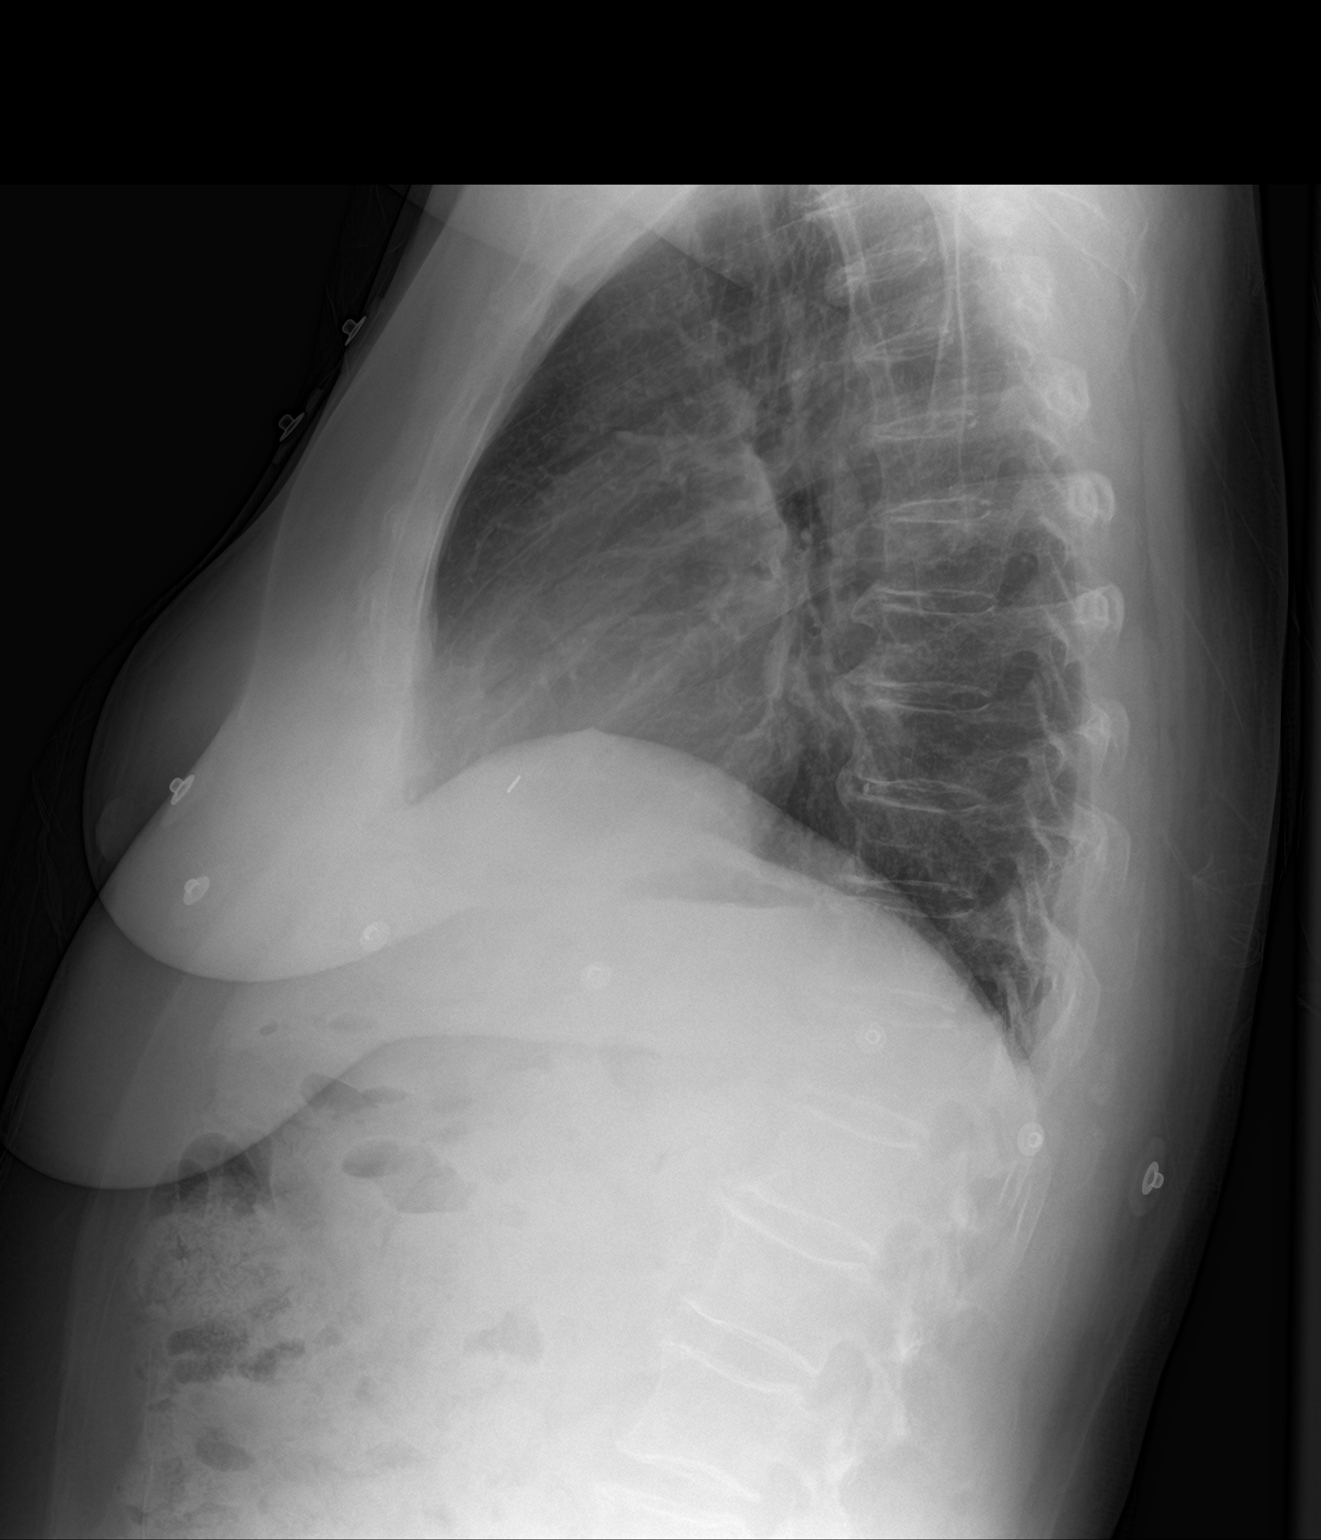

[2 of 2 positions shown; findings below may reference images not displayed]

FINDINGS: Normal sized heart. Clear lungs. Normal vascularity. Thoracic spine
degenerative changes. Left breast surgical clip.
IMPRESSION: No acute abnormality.

## 2017-03-20 ENCOUNTER — Ambulatory Visit (INDEPENDENT_AMBULATORY_CARE_PROVIDER_SITE_OTHER): Payer: BLUE CROSS/BLUE SHIELD

## 2017-03-20 DIAGNOSIS — Z23 Encounter for immunization: Secondary | ICD-10-CM | POA: Diagnosis not present

## 2017-03-28 ENCOUNTER — Ambulatory Visit (INDEPENDENT_AMBULATORY_CARE_PROVIDER_SITE_OTHER): Payer: BLUE CROSS/BLUE SHIELD | Admitting: Nurse Practitioner

## 2017-03-28 ENCOUNTER — Encounter: Payer: Self-pay | Admitting: Nurse Practitioner

## 2017-03-28 ENCOUNTER — Ambulatory Visit: Payer: BLUE CROSS/BLUE SHIELD | Admitting: Cardiovascular Disease

## 2017-03-28 VITALS — BP 122/79 | HR 70 | Temp 97.4°F | Ht 64.0 in | Wt 201.0 lb

## 2017-03-28 VITALS — BP 140/90 | HR 65 | Ht 64.0 in | Wt 200.4 lb

## 2017-03-28 DIAGNOSIS — R079 Chest pain, unspecified: Secondary | ICD-10-CM

## 2017-03-28 DIAGNOSIS — H6121 Impacted cerumen, right ear: Secondary | ICD-10-CM | POA: Diagnosis not present

## 2017-03-28 DIAGNOSIS — I5181 Takotsubo syndrome: Secondary | ICD-10-CM | POA: Diagnosis not present

## 2017-03-28 LAB — TROPONIN I: Troponin I: <0.03 ng/mL (ref <0.03)

## 2017-03-28 MED ORDER — NITROGLYCERIN 0.4 MG SL SUBL: SUBLINGUAL_TABLET | SUBLINGUAL | 3 refills | Status: DC

## 2017-03-28 NOTE — Patient Instructions (Addendum)
We will be checking the following labs today - stat Troponin I   Medication Instructions:    Continue with your current medicines.   I refilled your NTG today    Testing/Procedures To Be Arranged:  N/A  Follow-Up:   See Dr. Johnsie Cancel as planned in 2 weeks.     Other Special Instructions:  Use your NTG under your tongue for recurrent chest pain. May take one tablet every 5 minutes. If you are still having discomfort after 3 tablets in 15 minutes, call 911.   Head to the ER if you have recurrent chest pain.   Monitor your BP for [place] - keep a diary    If you need a refill on your cardiac medications before your next appointment, please call your pharmacy.   Call the [place] office at [phone] if you have any questions, problems or concerns.

## 2017-03-28 NOTE — Patient Instructions (Signed)
Earwax Buildup, Adult The ears produce a substance called earwax that helps keep bacteria out of the ear and protects the skin in the ear canal. Occasionally, earwax can build up in the ear and cause discomfort or hearing loss. What increases the risk? This condition is more likely to develop in people who:  Are female.  Are elderly.  Naturally produce more earwax.  Clean their ears often with cotton swabs.  Use earplugs often.  Use in-ear headphones often.  Wear hearing aids.  Have narrow ear canals.  Have earwax that is overly thick or sticky.  Have eczema.  Are dehydrated.  Have excess hair in the ear canal.  What are the signs or symptoms? Symptoms of this condition include:  Reduced or muffled hearing.  A feeling of fullness in the ear or feeling that the ear is plugged.  Fluid coming from the ear.  Ear pain.  Ear itch.  Ringing in the ear.  Coughing.  An obvious piece of earwax that can be seen inside the ear canal.  How is this diagnosed? This condition may be diagnosed based on:  Your symptoms.  Your medical history.  An ear exam. During the exam, your health care provider will look into your ear with an instrument called an otoscope.  You may have tests, including a hearing test. How is this treated? This condition may be treated by:  Using ear drops to soften the earwax.  Having the earwax removed by a health care provider. The health care provider may: ? Flush the ear with water. ? Use an instrument that has a loop on the end (curette). ? Use a suction device.  Surgery to remove the wax buildup. This may be done in severe cases.  Follow these instructions at home:  Take over-the-counter and prescription medicines only as told by your health care provider.  Do not put any objects, including cotton swabs, into your ear. You can clean the opening of your ear canal with a washcloth or facial tissue.  Follow instructions from your health  care provider about cleaning your ears. Do not over-clean your ears.  Drink enough fluid to keep your urine clear or pale yellow. This will help to thin the earwax.  Keep all follow-up visits as told by your health care provider. If earwax builds up in your ears often or if you use hearing aids, consider seeing your health care provider for routine, preventive ear cleanings. Ask your health care provider how often you should schedule your cleanings.  If you have hearing aids, clean them according to instructions from the manufacturer and your health care provider. Contact a health care provider if:  You have ear pain.  You develop a fever.  You have blood, pus, or other fluid coming from your ear.  You have hearing loss.  You have ringing in your ears that does not go away.  Your symptoms do not improve with treatment.  You feel like the room is spinning (vertigo). Summary  Earwax can build up in the ear and cause discomfort or hearing loss.  The most common symptoms of this condition include reduced or muffled hearing and a feeling of fullness in the ear or feeling that the ear is plugged.  This condition may be diagnosed based on your symptoms, your medical history, and an ear exam.  This condition may be treated by using ear drops to soften the earwax or by having the earwax removed by a health care provider.  Do   not put any objects, including cotton swabs, into your ear. You can clean the opening of your ear canal with a washcloth or facial tissue. This information is not intended to replace advice given to you by your health care provider. Make sure you discuss any questions you have with your health care provider. Document Released: 07/13/2004 Document Revised: 08/16/2016 Document Reviewed: 08/16/2016 Elsevier Interactive Patient Education  2018 [employer]  

## 2017-03-28 NOTE — Progress Notes (Signed)
   Subjective:    Patient ID: [name], female    DOB: 1960-11-09, 56 y.o.   MRN: [id number]  HPI  Patient comes in today with 2 complaints: - chest pain last night- came on suddenly radiated from middle of chest and radiated around under left breast and around to back. She took 3 nitro in 15 minutes and relieved pain. SHe refused to let husband take hr to the ER. No chest pain since then. SHe did have a heart attack 2 years ago. Has follow up with cardiologist in 2 weeks. - bil ears stopped up- needs wax cleaned out.  Review of Systems  Constitutional: Negative for activity change and appetite change.  HENT: Negative.   Eyes: Negative for pain.  Respiratory: Negative for shortness of breath.   Cardiovascular: Positive for chest pain. Negative for palpitations and leg swelling.  Gastrointestinal: Negative for abdominal pain.  Endocrine: Negative for polydipsia.  Genitourinary: Negative.   Skin: Negative for rash.  Neurological: Negative for dizziness, weakness and headaches.  Hematological: Does not bruise/bleed easily.  Psychiatric/Behavioral: Negative.   All other systems reviewed and are negative.      Objective:   Physical Exam  Constitutional: She is oriented to person, place, and time. She appears well-developed and well-nourished. No distress.  HENT:  Right Ear: Tympanic membrane and external ear normal.  Left Ear: Hearing, tympanic membrane, external ear and ear canal normal.  Cerumen impaction right ear canal  Cardiovascular: Normal rate and regular rhythm.   Pulmonary/Chest: Effort normal and breath sounds normal.  Neurological: She is alert and oriented to person, place, and time.  Skin: Skin is warm.  Psychiatric: She has a normal mood and affect. Her behavior is normal. Judgment and thought content normal.    BP 122/79   Pulse 70   Temp (!) 97.4 F (36.3 C) (Oral)   Ht 5\' 4"  (1.626 m)   Wt 201 lb (91.2 kg)   LMP 05/18/2011   BMI 34.50 kg/m   EKG- no  st depression or elevation- Sinus rhythym- no change from Larae Grooms, FNP      Assessment & Plan:  1. Chest pain, unspecified type To cardiology today at 11 this  morning - EKG 12-Lead - Ambulatory referral to Cardiology  2. Impacted cerumen of right ear debrox OTC 2-3 x a week  Mary-Margaret Hassell Done, FNP

## 2017-03-28 NOTE — Progress Notes (Signed)
CARDIOLOGY OFFICE NOTE  Date:  03/28/2017    [name] Date of Birth: 1961-02-05 Medical Record #[id number]  PCP:  Claretta Fraise, MD  Cardiologist:  Johnsie Cancel   Chief Complaint  Patient presents with  . Chest Pain    Work in visit - seen for Dr. Johnsie Cancel    History of Present Illness: [name] is a 56 y.o. female who presents today for a work in visit. Seen for Dr. Johnsie Cancel.   She has a history of chronically abnormal EKG. Remote normal cath in [facility] many years ago. Admitted in 2016 with chest pain - found to have Tkatsubo with EF 45% - no CAD by cath - MRI in 02/2015 showed normal EF. Hospital visit in 06/2015 for chest pain with negative evaluation.   Last seen here last August of 2017 - lots of complaints noted but cardiac status was felt to be stable.   Seen by her PCP earlier today - had had chest pain last night - refused ER evaluation. Referred here.   Thus added to my schedule for today.   Comes in today. Here with her husband Fritz Pickerel. She had been doing well up until last night. Admits she does not exercise. She knows she needs to loose weight. No real significant stress. Was getting ready to fix dinner - got a sharp pain in her chest - radiated to the breast and into her back - ended up taking 3 NTG over a 15 minute period. The whole spell lasted about 20 to 30 minutes. Noted her heart beating fast/racing. Little short of breath. Was not upset. Had had a good day at work. She thought about going to the hospital but admits she was "pig headed" and because she was feeling better she did not see the need to go. She feels fine now.  Saw her PCP this morning for a "bad ear" and told them about her chest pain - was referred to the hospital to get a troponin but tells me she was told that result would take over 24 hours and therefore was not done and to come here instead.   Past Medical History:  Diagnosis Date  . Anxiety   . Arthritis    back (12/30/2014)  .  Childhood asthma   . Daily headache   . Depression   . GERD (gastroesophageal reflux disease)   . History of palpitations    STRESS INDUCED  . Hypercholesterolemia dx'd 12/2014  . Kidney cysts    "I think it was right"  . Kidney stones   . Left ureteral calculus   . 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 NSTEMI (non-ST elevated myocardial infarction) ([place]) 12/28/2014  . PONV (postoperative nausea and vomiting)   . Takotsubo syndrome    "Broken heart syndrome"    Past Surgical History:  Procedure Laterality Date  . BACK SURGERY    . BREAST BIOPSY Bilateral early 2000's  . CARDIAC CATHETERIZATION  2006 (APPROX)  [place]   NORMAL  . CARDIAC CATHETERIZATION  12/30/2014  . CARDIAC CATHETERIZATION N/A 12/30/2014   Procedure: Left Heart Cath and Coronary Angiography;  Surgeon: Mertie Clause  Fletcher Anon, MD;  Location: [facility];  Service: Cardiovascular;  Laterality: N/A;  . CARDIOVASCULAR STRESS TEST  03-21-2012  DR Johnsie Cancel   NORMAL NUCLEAR STUDY/  NO ISCHEMIA/  EF 63%  . CYSTOSCOPY W/ URETERAL STENT PLACEMENT Left 03/26/2013   Procedure: CYSTOSCOPY WITH RETROGRADE PYELOGRAM ;  Surgeon: Molli Hazard, MD;  Location[facility];  Service: Urology;  Laterality: Left;  . CYSTOSCOPY WITH RETROGRADE PYELOGRAM, URETEROSCOPY AND STENT PLACEMENT Bilateral 03/19/2013   Procedure: CYSTOSCOPY WITH RETROGRADE PYELOGRAM, BILATERAL URETEROSCOPY AND STENT PLACEMENT LEFT URETER,BILATERAL STONE EXTRACTION , HOLMIUM LASER LEFT URETER;  Surgeon: Molli Hazard, MD;  Location: [facility];  Service: Urology;  Laterality: Bilateral;  . CYSTOSCOPY WITH STENT PLACEMENT Left 03/26/2013   Procedure: CYSTOSCOPY  WITH STENT PLACEMENT;  Surgeon: Molli Hazard, MD;  Location: [facility];  Service: Urology;  Laterality: Left;  . DIAGNOSTIC LAPAROSCOPY  04-12-2009  . ESOPHAGOGASTRODUODENOSCOPY (EGD) WITH PROPOFOL N/A 02/10/2016   Procedure: ESOPHAGOGASTRODUODENOSCOPY (EGD) WITH PROPOFOL;  Surgeon: Ronald Lobo, MD;  Location: [facility];  Service: Endoscopy;  Laterality: N/A;  . KIDNEY SURGERY  1966   BILATERAL URETER'S DILATATION  . LUMBAR LAMINECTOMY/DECOMPRESSION MICRODISCECTOMY  05/18/2011   Procedure: LUMBAR LAMINECTOMY/DECOMPRESSION MICRODISCECTOMY;  Surgeon: Johnn Hai;  Location: [facility];  Service: Orthopedics;  Laterality: Right;  Decompression Lumbar 4-Lumbar 5  Right    (xray)   . MELANOMA EXCISION WITH SENTINEL LYMPH NODE BIOPSY  2001   moh's procedure/  RIGHT FOREHEAD AND UPPER EYEBROW  . RIGHT LATERAL PAROTIDECTOMY W/ NERVE DISSECTION / RIGHT MODIFIED RADICAL NECK DISSECTION SPARING SCM ELEVENTH NERVE AND INTERNAL JUGULAR VEIN  09-12-2006  DR DWIGHT BATES   DR DWIGHT BATES; "inside gland; lots of lymph nodes"     Medications: Current Meds  Medication Sig  . albuterol (PROVENTIL HFA;VENTOLIN HFA) 108 (90 Base) MCG/ACT inhaler Inhale 2 puffs into the lungs every 6 (six) hours as needed for wheezing or shortness of breath.  Marland Kitchen aspirin 81 MG chewable tablet Chew 81 mg by mouth daily.  Marland Kitchen atorvastatin (LIPITOR) 20 MG tablet Take 1 tablet (20 mg total) by mouth daily.  . Calcium Carb-Cholecalciferol (CALCIUM 600 + D) 600-200 MG-UNIT TABS Take 2 tablets by mouth daily.   . carvedilol (COREG) 3.125 MG tablet Take 1 tablet (3.125 mg total) by mouth 2 (two) times daily with a meal.  . docusate sodium (COLACE) 100 MG capsule Take 1 capsule (100 mg total) by mouth every 12 (twelve) hours.  . fluticasone (FLONASE) 50 MCG/ACT nasal spray Place 1 spray into both nostrils as needed for allergies or rhinitis.  Marland Kitchen ketotifen (THERA TEARS ALLERGY) 0.025 % ophthalmic solution Apply 1  drop to eye daily as needed (for dry eye relief).  Marland Kitchen lisinopril (PRINIVIL,ZESTRIL) 2.5 MG tablet Take 1 tablet (2.5 mg total) by mouth daily.  . magnesium oxide (MAG-OX) 400 MG tablet Take 400 mg by mouth daily.  . Multiple Vitamin (MULTIVITAMIN) tablet Take 1 tablet by mouth daily.  . nitroGLYCERIN (NITROSTAT) 0.4 MG SL tablet DISSOLVE ONE TABLET UNDER THE TONGUE EVERY 5 MINUTES AS NEEDED FOR CHEST PAIN.  DO NOT EXCEED A TOTAL OF 3 DOSES IN 15 MINUTES  . pantoprazole (PROTONIX) 40 MG tablet Take 40 mg by mouth 2 (two) times daily.  . TRULANCE 3 MG TABS   . [DISCONTINUED] fluticasone (FLONASE) 50 MCG/ACT nasal spray Place 2 sprays into both nostrils daily. (Patient taking differently: Place 2 sprays into both nostrils as needed. )  . [  DISCONTINUED] NITROSTAT 0.4 MG SL tablet DISSOLVE ONE TABLET UNDER THE TONGUE EVERY 5 MINUTES AS NEEDED FOR CHEST PAIN.  DO NOT EXCEED A TOTAL OF 3 DOSES IN 15 MINUTES     Allergies: Allergies  Allergen Reactions  . Ativan [Lorazepam] Shortness Of Breath and Other (See Comments)    She may be very sensitive to benzo.   . Cephalexin Shortness Of Breath and Rash    Social History: The patient  reports that she has never smoked. She has never used smokeless tobacco. She reports that she drinks alcohol. She reports that she does not use drugs.   Family History: The patient's family history includes COPD in her mother; Cancer in her father and mother; Dementia in her mother; Heart disease in her father; Hyperlipidemia in her father; Hypertension in her father and mother.   Review of Systems: Please see the history of present illness.   Otherwise, the review of systems is positive for none.   All other systems are reviewed and negative.   Physical Exam: VS:  BP 140/90 (BP Location: Left Arm, Patient Position: Sitting, Cuff Size: Large)   Pulse 65   Ht 5\' 4"  (1.626 m)   Wt 200 lb 6.4 oz (90.9 kg)   LMP 05/18/2011   BMI 34.40 kg/m  .  BMI Body mass index is  34.4 kg/m.  Wt Readings from Last 3 Encounters:  03/28/17 200 lb 6.4 oz (90.9 kg)  03/28/17 201 lb (91.2 kg)  12/28/16 187 lb (84.8 kg)    General: Pleasant. Obese. Alert and in no acute distress.   HEENT: Normal.  Neck: Supple, no JVD, carotid bruits, or masses noted.  Cardiac: Regular rate and rhythm. No murmurs, rubs, or gallops. No edema.  Respiratory:  Lungs are clear to auscultation bilaterally with normal work of breathing.  GI: Soft and nontender.  MS: No deformity or atrophy. Gait and ROM intact.  Skin: Warm and dry. Color is normal.  Neuro:  Strength and sensation are intact and no gross focal deficits noted.  Psych: Alert, appropriate and with normal affect.   LABORATORY DATA:  EKG:  EKG is ordered today. This demonstrates NSR with anterior T wave changes.  [facility] Results  Component Value Date   WBC 5.2 08/22/2016   HGB 12.9 08/22/2016   HCT 37.7 08/22/2016   PLT 216 08/22/2016   GLUCOSE 112 (H) 12/28/2016   CHOL 211 (H) 12/28/2016   TRIG 70 12/28/2016   HDL 64 12/28/2016   LDLCALC 133 (H) 12/28/2016   ALT 19 12/28/2016   AST 26 12/28/2016   NA 145 (H) 12/28/2016   K 4.5 12/28/2016   CL 104 12/28/2016   CREATININE 0.80 12/28/2016   BUN 18 12/28/2016   CO2 23 12/28/2016   TSH 2.387 12/29/2014   INR 1.10 12/30/2014   HGBA1C 5.5 12/29/2014     BNP (last 3 results) No results for input(s): BNP in the last 8760 hours.  ProBNP (last 3 results) No results for input(s): PROBNP in the last 8760 hours.   Other Studies Reviewed Today:  Cardiac MRI IMPRESSION 02/2015: 1) Normal LV size and function  2) Quantitative EF 64% no RWMA;s  3) No scar tissue or hyperenhancement  Overall findings consistent with Takatsubo DCM  Electronically Signed   By: Jenkins Rouge M.D.   On: 03/18/2015 16:52   Echo Study Conclusions from 12/2014  - Left ventricle: The cavity size was normal. Systolic function was   mildly reduced. The estimated ejection  fraction  was 45%. Diffuse   hypokinesis. Doppler parameters are consistent with abnormal left   ventricular relaxation (grade 1 diastolic dysfunction). There was   no evidence of elevated ventricular filling pressure by Doppler   parameters. - Aortic valve: Trileaflet; normal thickness leaflets. There was   trivial regurgitation. - Aortic root: The aortic root was normal in size. - Mitral valve: Structurally normal valve. There was no   regurgitation. - Left atrium: The atrium was normal in size. - Right ventricle: Systolic function was normal. - Right atrium: The atrium was normal in size. - Tricuspid valve: There was trivial regurgitation. - Pulmonic valve: There was no regurgitation. - Pulmonary arteries: Systolic pressure was within the normal   range. - Inferior vena cava: The vessel was normal in size. - Pericardium, extracardiac: There was no pericardial effusion.    Procedures   Left Heart Cath and Coronary Angiography 12/2014  Conclusion   1. Normal coronary arteries. 2. Moderately to severely reduced LV systolic function with an ejection fraction of 30-35% with severe hypokinesis of the mid distal anterior, apical and mid to distal inferior walls consistent with stress-induced cardiomyopathy. 3. Mildly elevated left ventricular end-diastolic pressure.  Recommendations: Recommend a small dose beta blocker and ACE inhibitor. Avoid stress. Monitor for at least another day and possible discharge home tomorrow if she remains stable.     Assessment/Plan:  1. One prolonged episode of chest pain - no CAD noted at time of cath 2 years ago - discussed with Dr. Curt Bears (DOD) who does not feel this episode of chest pain is cardiac related. Will check troponin. Further disposition to follow. If she has recurrence - she has been advised to go to the hospital. If troponin comes back + she will need to be admitted. Her troponin I was sent to [facility].   2. Elevated BP - recheck by me is 160/90.  She denies having HTN and BP at PCP's office was fine. I have asked her to monitor - she works at [employer] and has access. I would not use the wrist cuff that she has at home.   3. History of Tatoksubo CM - EF recovered by MRI. She has no symptoms of CHF. Would keep on her current regimen for now.   Current medicines are reviewed with the patient today.  The patient does not have concerns regarding medicines other than what has been noted above.  The following changes have been made:  See above.  Labs/ tests ordered today include:    Orders Placed This Encounter  Procedures  . Troponin I  . EKG 12-Lead     Disposition:   Further disposition to follow. FU with Dr. Johnsie Cancel later this month as planned.   Patient is agreeable to this plan and will call if any problems develop in the interim.   SignedTruitt Merle, NP  03/28/2017 12:27 PM  [facility] [address] [place], [place]  [address] Phone: [phone] Fax: [phone]

## 2017-04-02 DIAGNOSIS — H6521 Chronic serous otitis media, right ear: Secondary | ICD-10-CM | POA: Insufficient documentation

## 2017-04-02 DIAGNOSIS — H9071 Mixed conductive and sensorineural hearing loss, unilateral, right ear, with unrestricted hearing on the contralateral side: Secondary | ICD-10-CM | POA: Insufficient documentation

## 2017-04-10 NOTE — Progress Notes (Signed)
CARDIOLOGY OFFICE NOTE  Date:  04/13/2017    [name] Date of Birth: [birth year] Medical Record #[id number]  PCP:  Dettinger, Fransisca Kaufmann, MD  Cardiologist:  Johnsie Cancel   No chief complaint on file.   History of Present Illness: [name] is a 56 y.o. female who presents for f/u Takatsubo DCM, Abnormal ECG, labile BP and chest pain   She has a history of chronically abnormal EKG. Remote normal cath in [facility] many years ago. Admitted in 2016 with chest pain - found to have Tkatsubo with EF 45% - no CAD by cath - MRI in 02/2015 showed normal EF. Hospital visit in 06/2015 for chest pain with negative evaluation.   Seen by PA 03/28/17 for chest pain. Sharp pain in chest radiating to left breast and back. Lasted 30 minutes and no full Relief with 3 nitro Troponin was negative  Still getting "twinges" of pain not taken any more nitro Discussed issue with micro vascular angina And options going forward     Past Medical History:  Diagnosis Date  . Anxiety   . Arthritis    back (12/30/2014)  . Childhood asthma   . Daily headache   . Depression   . GERD (gastroesophageal reflux disease)   . History of palpitations    STRESS INDUCED  . Hypercholesterolemia dx'd 12/2014  . Kidney cysts    "I think it was right"  . Kidney stones   . Left ureteral calculus   . 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 NSTEMI (non-ST elevated myocardial infarction) ([place]) 12/28/2014  . PONV (postoperative nausea and vomiting)   . Takotsubo syndrome    "Broken heart syndrome"    Past Surgical History:  Procedure Laterality Date  . BACK SURGERY    . BREAST BIOPSY Bilateral early 2000's  . CARDIAC  CATHETERIZATION  2006 (APPROX)  [place]   NORMAL  . CARDIAC CATHETERIZATION  12/30/2014  . CARDIAC CATHETERIZATION N/A 12/30/2014   Procedure: Left Heart Cath and Coronary Angiography;  Surgeon: Wellington Hampshire, MD;  Location: [facility];  Service: Cardiovascular;  Laterality: N/A;  . CARDIOVASCULAR STRESS TEST  03-21-2012  DR Johnsie Cancel   NORMAL NUCLEAR STUDY/  NO ISCHEMIA/  EF 63%  . CYSTOSCOPY W/ URETERAL STENT PLACEMENT Left 03/26/2013   Procedure: CYSTOSCOPY WITH RETROGRADE PYELOGRAM ;  Surgeon: Molli Hazard, MD;  Location: [facility];  Service: Urology;  Laterality: Left;  . CYSTOSCOPY WITH RETROGRADE PYELOGRAM, URETEROSCOPY AND STENT PLACEMENT Bilateral 03/19/2013   Procedure: CYSTOSCOPY WITH RETROGRADE PYELOGRAM, BILATERAL URETEROSCOPY AND STENT PLACEMENT LEFT URETER,BILATERAL STONE EXTRACTION , HOLMIUM LASER LEFT URETER;  Surgeon: Molli Hazard, MD;  Location: [facility];  Service: Urology;  Laterality: Bilateral;  . CYSTOSCOPY WITH STENT PLACEMENT Left 03/26/2013   Procedure: CYSTOSCOPY WITH STENT PLACEMENT;  Surgeon: Molli Hazard, MD;  Location: [facility];  Service: Urology;  Laterality: Left;  . DIAGNOSTIC LAPAROSCOPY  04-12-2009  . ESOPHAGOGASTRODUODENOSCOPY (EGD) WITH PROPOFOL N/A 02/10/2016   Procedure: ESOPHAGOGASTRODUODENOSCOPY (EGD) WITH PROPOFOL;  Surgeon: Ronald Lobo, MD;  Location: [facility];  Service: Endoscopy;  Laterality: N/A;  . KIDNEY  SURGERY  1966   BILATERAL URETER'S DILATATION  . LUMBAR LAMINECTOMY/DECOMPRESSION MICRODISCECTOMY  05/18/2011   Procedure: LUMBAR LAMINECTOMY/DECOMPRESSION MICRODISCECTOMY;  Surgeon: Johnn Hai;  Location: [facility];  Service: Orthopedics;  Laterality: Right;  Decompression Lumbar 4-Lumbar 5  Right    (xray)   . MELANOMA EXCISION WITH SENTINEL LYMPH NODE BIOPSY  2001   moh's procedure/  RIGHT FOREHEAD AND UPPER EYEBROW  . RIGHT LATERAL PAROTIDECTOMY W/ NERVE DISSECTION /  RIGHT MODIFIED RADICAL NECK DISSECTION SPARING SCM ELEVENTH NERVE AND INTERNAL JUGULAR VEIN  09-12-2006  DR DWIGHT BATES   DR DWIGHT BATES; "inside gland; lots of lymph nodes"     Medications: Current Meds  Medication Sig  . albuterol (PROVENTIL HFA;VENTOLIN HFA) 108 (90 Base) MCG/ACT inhaler Inhale 2 puffs into the lungs every 6 (six) hours as needed for wheezing or shortness of breath.  Marland Kitchen aspirin 81 MG chewable tablet Chew 81 mg by mouth daily.  Marland Kitchen atorvastatin (LIPITOR) 20 MG tablet Take 1 tablet (20 mg total) by mouth daily.  . Calcium Carb-Cholecalciferol (CALCIUM 600 + D) 600-200 MG-UNIT TABS Take 2 tablets by mouth daily.   . carvedilol (COREG) 3.125 MG tablet Take 1 tablet (3.125 mg total) by mouth 2 (two) times daily with a meal.  . docusate sodium (COLACE) 100 MG capsule Take 1 capsule (100 mg total) by mouth every 12 (twelve) hours.  . fluticasone (FLONASE) 50 MCG/ACT nasal spray Place 1 spray into both nostrils as needed for allergies or rhinitis.  Marland Kitchen ketotifen (THERA TEARS ALLERGY) 0.025 % ophthalmic solution Apply 1 drop to eye daily as needed (for dry eye relief).  Marland Kitchen lisinopril (PRINIVIL,ZESTRIL) 2.5 MG tablet Take 1 tablet (2.5 mg total) by mouth daily.  . magnesium oxide (MAG-OX) 400 MG tablet Take 400 mg by mouth daily.  . Multiple Vitamin (MULTIVITAMIN) tablet Take 1 tablet by mouth daily.  . nitroGLYCERIN (NITROSTAT) 0.4 MG SL tablet DISSOLVE ONE TABLET UNDER THE TONGUE EVERY 5 MINUTES AS NEEDED FOR CHEST PAIN.  DO NOT EXCEED A TOTAL OF 3 DOSES IN 15 MINUTES  . pantoprazole (PROTONIX) 40 MG tablet Take 40 mg by mouth 2 (two) times daily.  Mellody Memos 3 MG TABS      Allergies: Allergies  Allergen Reactions  . Ativan [Lorazepam] Shortness Of Breath and Other (See Comments)    She may be very sensitive to benzo.   . Cephalexin Shortness Of Breath and Rash    Social History: The patient  reports that she has never smoked. She has never used smokeless tobacco. She  reports that she drinks alcohol. She reports that she does not use drugs.   Family History: The patient's family history includes COPD in her mother; Cancer in her father and mother; Dementia in her mother; Heart disease in her father; Hyperlipidemia in her father; Hypertension in her father and mother.   Review of Systems: Please see the history of present illness.   Otherwise, the review of systems is positive for none.   All other systems are reviewed and negative.   Physical Exam: VS:  BP 116/74   Pulse 78   Ht 5\' 4"  (1.626 m)   Wt 196 lb 4 oz (89 kg)   LMP 05/18/2011   SpO2 97%   BMI 33.69 kg/m  .  BMI Body mass index is 33.69 kg/m.  Wt Readings from Last 3 Encounters:  04/13/17 196 lb 4 oz (89 kg)  03/28/17 200 lb 6.4 oz (90.9 kg)  03/28/17 201 lb (  91.2 kg)   Affect appropriate Healthy:  appears stated age 47: normal Neck supple with no adenopathy JVP normal no bruits no thyromegaly Lungs clear with no wheezing and good diaphragmatic motion Heart:  S1/S2 no murmur, no rub, gallop or click PMI normal Abdomen: benighn, BS positve, no tenderness, no AAA no bruit.  No HSM or HJR Distal pulses intact with no bruits No edema Neuro non-focal Skin warm and dry No muscular weakness    LABORATORY DATA:  EKG:  03/28/17  NSR with anterior T wave changes chronic .  [facility] Results  Component Value Date   WBC 5.2 08/22/2016   HGB 12.9 08/22/2016   HCT 37.7 08/22/2016   PLT 216 08/22/2016   GLUCOSE 112 (H) 12/28/2016   CHOL 211 (H) 12/28/2016   TRIG 70 12/28/2016   HDL 64 12/28/2016   LDLCALC 133 (H) 12/28/2016   ALT 19 12/28/2016   AST 26 12/28/2016   NA 145 (H) 12/28/2016   K 4.5 12/28/2016   CL 104 12/28/2016   CREATININE 0.80 12/28/2016   BUN 18 12/28/2016   CO2 23 12/28/2016   TSH 2.387 12/29/2014   INR 1.10 12/30/2014   HGBA1C 5.5 12/29/2014     BNP (last 3 results) No results for input(s): BNP in the last 8760 hours.  ProBNP (last 3 results) No  results for input(s): PROBNP in the last 8760 hours.   Other Studies Reviewed Today:  Cardiac MRI IMPRESSION 02/2015: 1) Normal LV size and function  2) Quantitative EF 64% no RWMA;s  3) No scar tissue or hyperenhancement  Overall findings consistent with Takatsubo DCM  Electronically Signed   By: Jenkins Rouge M.D.   On: 03/18/2015 16:52   Echo Study Conclusions from 12/2014  - Left ventricle: The cavity size was normal. Systolic function was   mildly reduced. The estimated ejection fraction was 45%. Diffuse   hypokinesis. Doppler parameters are consistent with abnormal left   ventricular relaxation (grade 1 diastolic dysfunction). There was   no evidence of elevated ventricular filling pressure by Doppler   parameters. - Aortic valve: Trileaflet; normal thickness leaflets. There was   trivial regurgitation. - Aortic root: The aortic root was normal in size. - Mitral valve: Structurally normal valve. There was no   regurgitation. - Left atrium: The atrium was normal in size. - Right ventricle: Systolic function was normal. - Right atrium: The atrium was normal in size. - Tricuspid valve: There was trivial regurgitation. - Pulmonic valve: There was no regurgitation. - Pulmonary arteries: Systolic pressure was within the normal   range. - Inferior vena cava: The vessel was normal in size. - Pericardium, extracardiac: There was no pericardial effusion.    Procedures   Left Heart Cath and Coronary Angiography 12/2014  Conclusion   1. Normal coronary arteries. 2. Moderately to severely reduced LV systolic function with an ejection fraction of 30-35% with severe hypokinesis of the mid distal anterior, apical and mid to distal inferior walls consistent with stress-induced cardiomyopathy. 3. Mildly elevated left ventricular end-diastolic pressure.  Recommendations: Recommend a small dose beta blocker and ACE inhibitor. Avoid stress. Monitor for at least another day  and possible discharge home tomorrow if she remains stable.     Assessment/Plan:  1. Chest pain:  no CAD noted at time of cath 2 years ago  Will order cardiac CTA  2. Elevated BP - improved continue current meds   3. History of Tatoksubo CM - EF 64% recovered by MRI 03/18/15 .  She has no symptoms of CHF. Would keep on her current regimen for now.   BMET and cardiac CTA ordered today   Jenkins Rouge

## 2017-04-13 ENCOUNTER — Encounter: Payer: Self-pay | Admitting: Cardiovascular Disease

## 2017-04-13 ENCOUNTER — Ambulatory Visit: Payer: BLUE CROSS/BLUE SHIELD | Admitting: Cardiovascular Disease

## 2017-04-13 ENCOUNTER — Ambulatory Visit (INDEPENDENT_AMBULATORY_CARE_PROVIDER_SITE_OTHER): Payer: BLUE CROSS/BLUE SHIELD | Admitting: Cardiovascular Disease

## 2017-04-13 VITALS — BP 116/74 | HR 78 | Ht 64.0 in | Wt 196.2 lb

## 2017-04-13 DIAGNOSIS — R079 Chest pain, unspecified: Secondary | ICD-10-CM | POA: Diagnosis not present

## 2017-04-13 DIAGNOSIS — I10 Essential (primary) hypertension: Secondary | ICD-10-CM | POA: Diagnosis not present

## 2017-04-13 NOTE — Patient Instructions (Addendum)
Medication Instructions:  Your physician recommends that you continue on your current medications as directed. Please refer to the Current Medication list given to you today.  Labwork: Your physician recommends that you have lab work today- BMET   Testing/Procedures: Your physician has requested that you have cardiac CT. Cardiac computed tomography (CT) is a painless test that uses an x-ray machine to take clear, detailed pictures of your heart. For further information please visit [facility]. Please follow instruction sheet as given.  Follow-Up: Your physician wants you to follow-up in: 6 months with Dr. Johnsie Cancel. You will receive a reminder letter in the mail two months in advance. If you don't receive a letter, please call our office to schedule the follow-up appointment.   If you need a refill on your cardiac medications before your next appointment, please call your pharmacy.  Please arrive at the [facility] main entrance of [facility] at xx:xx AM (30-45 minutes prior to test start time)  [facility] [address] [place], [place] [address] [phone]  Proceed to the [facility] Radiology Department (First Floor).  Please follow these instructions carefully (unless otherwise directed):  Hold all erectile dysfunction medications at least 48 hours prior to test.  On the Night Before the Test: . Drink plenty of water. . Do not consume any caffeinated/decaffeinated beverages or chocolate 12 hours prior to your test. . Do not take any antihistamines 12 hours prior to your test. . Take double of your Coreg 6.25 mg ( 2 tablets) the night before  On the Day of the Test: . Drink plenty of water. Do not drink any water within one hour of the test. . Do not eat any food 4 hours prior to the test. . You may take your regular medications prior to the test. . Take double of your Coreg 6.25 mg (2 tablets) the morning of your test.  After the  Test: . Drink plenty of water. . After receiving IV contrast, you may experience a mild flushed feeling. This is normal. . On occasion, you may experience a mild rash up to 24 hours after the test. This is not dangerous. If this occurs, you can take Benadryl 25 mg and increase your fluid intake. . If you experience trouble breathing, this can be serious. If it is severe call 911 IMMEDIATELY. If it is mild, please call our office.

## 2017-04-14 LAB — BASIC METABOLIC PANEL
BUN/Creatinine Ratio: 32 — ABNORMAL HIGH (ref 9–23)
BUN: 22 mg/dL (ref 6–24)
CHLORIDE: 103 mmol/L (ref 96–106)
CO2: 26 mmol/L (ref 20–29)
CREATININE: 0.69 mg/dL (ref 0.57–1.00)
Calcium: 9.2 mg/dL (ref 8.7–10.2)
GFR calc Af Amer: 111 mL/min/{1.73_m2} (ref >59)
GFR calc non Af Amer: 96 mL/min/{1.73_m2} (ref >59)
GLUCOSE: 100 mg/dL — AB (ref 65–99)
POTASSIUM: 4.4 mmol/L (ref 3.5–5.2)
SODIUM: 143 mmol/L (ref 134–144)

## 2017-04-19 ENCOUNTER — Ambulatory Visit (INDEPENDENT_AMBULATORY_CARE_PROVIDER_SITE_OTHER): Payer: BLUE CROSS/BLUE SHIELD | Admitting: Nurse Practitioner

## 2017-04-19 ENCOUNTER — Encounter: Payer: Self-pay | Admitting: Nurse Practitioner

## 2017-04-19 VITALS — BP 96/71 | HR 88 | Temp 97.6°F | Ht 64.0 in | Wt 197.0 lb

## 2017-04-19 DIAGNOSIS — H6521 Chronic serous otitis media, right ear: Secondary | ICD-10-CM | POA: Diagnosis not present

## 2017-04-19 MED ORDER — AZITHROMYCIN 250 MG PO TABS: ORAL_TABLET | ORAL | 0 refills | Status: DC

## 2017-04-19 MED ORDER — FLUTICASONE PROPIONATE 50 MCG/ACT NA SUSP: 1.0000 | NASAL | 2 refills | Status: DC | PRN

## 2017-04-19 NOTE — Progress Notes (Signed)
   Subjective:    Patient ID: [name], female    DOB: 10-Nov-1960, 56 y.o.   MRN: [id number]  HPI Patient in the office with c/o right ear pain x 2 weeks.  She saw an ENT who told her there was fluid behind her TM, but the pain is persistent and she does not have a f/u appt until next week.  Now her throat feels sore and swollen since yesterday.     Review of Systems  HENT: Positive for ear pain (right ear; x 2-3 wks), postnasal drip, sneezing and sore throat (x 1 day). Negative for ear discharge, rhinorrhea, sinus pain and sinus pressure.   Respiratory: Negative for cough, shortness of breath and wheezing.   Neurological: Positive for headaches (chronic).  All other systems reviewed and are negative.      Objective:   Physical Exam  Constitutional: She is oriented to person, place, and time. She appears well-developed and well-nourished. No distress.  HENT:  Head: Normocephalic.  Right Ear: Hearing, tympanic membrane and external ear normal.  Left Ear: Hearing, tympanic membrane and external ear normal.  Mouth/Throat: Oropharynx is clear and moist.  Tympanometry shows fluid on right normal on left  Eyes: Pupils are equal, round, and reactive to light.  Neck: Normal range of motion. Neck supple.  Cardiovascular: Normal rate, regular rhythm and normal heart sounds.   Pulmonary/Chest: Effort normal and breath sounds normal. No respiratory distress. She has no wheezes.  Lymphadenopathy:    She has no cervical adenopathy.  Neurological: She is alert and oriented to person, place, and time.  Skin: Skin is warm and dry.  Psychiatric: She has a normal mood and affect. Her behavior is normal.   BP 96/71   Pulse 88   Temp 97.6 F (36.4 C) (Oral)   Ht 5\' 4"  (1.626 m)   Wt 197 lb (89.4 kg)   LMP 05/18/2011   BMI 33.81 kg/m     Assessment & Plan:   1. Right chronic serous otitis media    Meds ordered this encounter  Medications  . azithromycin (ZITHROMAX Z-PAK) 250 MG  tablet    Sig: As directed    Dispense:  6 tablet    Refill:  0    Order Specific Question:   Supervising Provider    Answer:   VINCENT, CAROL L [[id number]]  . fluticasone (FLONASE) 50 MCG/ACT nasal spray    Sig: Place 1 spray into both nostrils as needed for allergies or rhinitis.    Dispense:  16 g    Refill:  2    Order Specific Question:   Supervising Provider    Answer:   Eustaquio Maize [[id number]]   Force fluids Keep follow up appointment with ENT  Mary-Margaret Hassell Done, FNP

## 2017-04-24 DIAGNOSIS — K219 Gastro-esophageal reflux disease without esophagitis: Secondary | ICD-10-CM | POA: Insufficient documentation

## 2017-05-08 DIAGNOSIS — H903 Sensorineural hearing loss, bilateral: Secondary | ICD-10-CM | POA: Insufficient documentation

## 2017-05-14 ENCOUNTER — Other Ambulatory Visit: Payer: Self-pay | Admitting: Physician Assistant

## 2017-05-14 DIAGNOSIS — H905 Unspecified sensorineural hearing loss: Secondary | ICD-10-CM

## 2017-05-15 ENCOUNTER — Ambulatory Visit (HOSPITAL_COMMUNITY)
Admission: RE | Admit: 2017-05-15 | Discharge: 2017-05-15 | Disposition: A | Discharge status: Home / Self Care | Payer: BLUE CROSS/BLUE SHIELD | Source: Ambulatory Visit | Attending: Cardiovascular Disease | Admitting: Cardiovascular Disease

## 2017-05-15 ENCOUNTER — Encounter (HOSPITAL_COMMUNITY): Payer: Self-pay

## 2017-05-15 ENCOUNTER — Ambulatory Visit (HOSPITAL_COMMUNITY): Admission: RE | Admit: 2017-05-15 | Payer: BLUE CROSS/BLUE SHIELD | Source: Ambulatory Visit

## 2017-05-15 DIAGNOSIS — K760 Fatty (change of) liver, not elsewhere classified: Secondary | ICD-10-CM | POA: Insufficient documentation

## 2017-05-15 DIAGNOSIS — I10 Essential (primary) hypertension: Secondary | ICD-10-CM | POA: Insufficient documentation

## 2017-05-15 DIAGNOSIS — R079 Chest pain, unspecified: Secondary | ICD-10-CM | POA: Diagnosis not present

## 2017-05-15 HISTORY — DX: Essential (primary) hypertension: I10

## 2017-05-15 MED ORDER — METOPROLOL TARTRATE 5 MG/5ML IV SOLN
2.5000 mg | Freq: Once | INTRAVENOUS | Status: AC
  Administered 2017-05-15: 2.5 mg via INTRAVENOUS
  Filled 2017-05-15: qty 5

## 2017-05-15 MED ORDER — IOPAMIDOL (ISOVUE-370) INJECTION 76%
100.0000 mL | Freq: Once | INTRAVENOUS | Status: AC | PRN
  Administered 2017-05-15: 80 mL via INTRAVENOUS

## 2017-05-15 MED ORDER — METOPROLOL TARTRATE 5 MG/5ML IV SOLN
INTRAVENOUS | Status: AC
  Filled 2017-05-15: qty 5

## 2017-05-15 MED ORDER — NITROGLYCERIN 0.4 MG SL SUBL
0.4000 mg | SUBLINGUAL_TABLET | SUBLINGUAL | Status: DC | PRN
  Administered 2017-05-15: 0.4 mg via SUBLINGUAL
  Filled 2017-05-15: qty 25

## 2017-05-15 MED ORDER — NITROGLYCERIN 0.4 MG SL SUBL
SUBLINGUAL_TABLET | SUBLINGUAL | Status: AC
  Administered 2017-05-15: 0.4 mg via SUBLINGUAL
  Filled 2017-05-15: qty 1

## 2017-05-23 ENCOUNTER — Emergency Department (HOSPITAL_COMMUNITY)
Admission: EM | Admit: 2017-05-23 | Discharge: 2017-05-23 | Disposition: A | Discharge status: Home / Self Care | Payer: BLUE CROSS/BLUE SHIELD | Attending: Emergency Medicine | Admitting: Emergency Medicine

## 2017-05-23 ENCOUNTER — Other Ambulatory Visit: Payer: Self-pay

## 2017-05-23 ENCOUNTER — Emergency Department (HOSPITAL_COMMUNITY): Payer: BLUE CROSS/BLUE SHIELD

## 2017-05-23 ENCOUNTER — Encounter (HOSPITAL_COMMUNITY): Payer: Self-pay | Admitting: Emergency Medicine

## 2017-05-23 DIAGNOSIS — F419 Anxiety disorder, unspecified: Secondary | ICD-10-CM | POA: Diagnosis not present

## 2017-05-23 DIAGNOSIS — R079 Chest pain, unspecified: Secondary | ICD-10-CM | POA: Diagnosis present

## 2017-05-23 DIAGNOSIS — I10 Essential (primary) hypertension: Secondary | ICD-10-CM | POA: Diagnosis not present

## 2017-05-23 DIAGNOSIS — J45909 Unspecified asthma, uncomplicated: Secondary | ICD-10-CM | POA: Insufficient documentation

## 2017-05-23 DIAGNOSIS — I2510 Atherosclerotic heart disease of native coronary artery without angina pectoris: Secondary | ICD-10-CM | POA: Insufficient documentation

## 2017-05-23 DIAGNOSIS — I252 Old myocardial infarction: Secondary | ICD-10-CM | POA: Diagnosis not present

## 2017-05-23 DIAGNOSIS — Z7982 Long term (current) use of aspirin: Secondary | ICD-10-CM | POA: Insufficient documentation

## 2017-05-23 DIAGNOSIS — Z79899 Other long term (current) drug therapy: Secondary | ICD-10-CM | POA: Insufficient documentation

## 2017-05-23 DIAGNOSIS — F329 Major depressive disorder, single episode, unspecified: Secondary | ICD-10-CM | POA: Insufficient documentation

## 2017-05-23 LAB — I-STAT TROPONIN, ED
TROPONIN I, POC: 0 ng/mL (ref 0.00–0.08)
Troponin i, poc: 0 ng/mL (ref 0.00–0.08)

## 2017-05-23 LAB — CBC
HCT: 38.7 % (ref 36.0–46.0)
Hemoglobin: 12.8 g/dL (ref 12.0–15.0)
MCH: 30.7 pg (ref 26.0–34.0)
MCHC: 33.1 g/dL (ref 30.0–36.0)
MCV: 92.8 fL (ref 78.0–100.0)
PLATELETS: 195 10*3/uL (ref 150–400)
RBC: 4.17 MIL/uL (ref 3.87–5.11)
RDW: 13.1 % (ref 11.5–15.5)
WBC: 3.3 10*3/uL — AB (ref 4.0–10.5)

## 2017-05-23 LAB — I-STAT BETA HCG BLOOD, ED (MC, WL, AP ONLY)

## 2017-05-23 LAB — BASIC METABOLIC PANEL
Anion gap: 8 (ref 5–15)
BUN: 16 mg/dL (ref 6–20)
CO2: 26 mmol/L (ref 22–32)
Calcium: 9.1 mg/dL (ref 8.9–10.3)
Chloride: 105 mmol/L (ref 101–111)
Creatinine, Ser: 0.8 mg/dL (ref 0.44–1.00)
GFR calc Af Amer: >60 mL/min (ref >60)
GFR calc non Af Amer: >60 mL/min (ref >60)
Glucose, Bld: 94 mg/dL (ref 65–99)
Potassium: 4 mmol/L (ref 3.5–5.1)
Sodium: 139 mmol/L (ref 135–145)

## 2017-05-23 MED ORDER — GI COCKTAIL ~~LOC~~
30.0000 mL | Freq: Once | ORAL | Status: AC
  Administered 2017-05-23: 30 mL via ORAL
  Filled 2017-05-23: qty 30

## 2017-05-23 MED ORDER — ACETAMINOPHEN 500 MG PO TABS
1000.0000 mg | ORAL_TABLET | Freq: Once | ORAL | Status: AC
  Administered 2017-05-23: 1000 mg via ORAL
  Filled 2017-05-23: qty 2

## 2017-05-23 NOTE — ED Notes (Signed)
Patient verbalized understanding of discharge instructions and denies any further needs or questions at this time. VS stable. Patient ambulatory with steady gait.  

## 2017-05-23 NOTE — ED Provider Notes (Signed)
[facility] DEPARTMENT Provider Note   CSN: [id number] Arrival date & time: 05/23/17  1213     History   Chief Complaint Chief Complaint  Patient presents with  . Chest Pain    HPI [name] is a 56 y.o. female presenting with acute onset central chest pain.  Patient states that she was upstairs visiting with her husband when she had sudden onset chest pain.  She took 3 nitro together without improvement of pain.  Chest pain lasted 15-20 minutes with associated shortness of breath.  She reports nausea, no vomiting.  She denies diaphoresis.  Chest pain has improved, but she has lingering tightness.  She reports a headache which began since being in the ER.  She states she has been very anxious and stressed recently, as her husband is admitted upstairs and she has been having a lot going on in her life.  She follows with cardiology with Dr. Johnsie Cancel, and had a normal cath in 2016. She has a history of takotsubo cardiomyopathy 2 years ago.  She denies recent travel, immobilization, or surgeries.  She is not on blood thinners, does not use hormones.  She denies fevers, chills, sore throat, cough, abdominal pain, urinary symptoms, abnormal bowel movements, or leg pain or swelling.  HPI  Past Medical History:  Diagnosis Date  . Anxiety   . Arthritis    back (12/30/2014)  . Childhood asthma   . Daily headache   . Depression   . GERD (gastroesophageal reflux disease)   . History of palpitations    STRESS INDUCED  . Hypercholesterolemia dx'd 12/2014  . Hypertension   . Kidney cysts    "I think it was right"  . Kidney stones   . Left ureteral calculus   . 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 NSTEMI (non-ST elevated myocardial infarction) ([place]) 12/28/2014  . PONV (postoperative nausea and vomiting)   . Takotsubo syndrome    "Broken heart syndrome"    Patient Active Problem List   Diagnosis Date Noted  . Aortic atherosclerosis ([place]) 02/04/2016  . Hyperlipidemia 12/26/2015  . Essential (primary) hypertension 12/26/2015  . Cardiomyopathy ([place])   . ICM- EF 30-35% at cath 12/30/14-improved to 45% by echo 01/06/15 01/06/2015  . Orthostatic hypotension 01/06/2015  . IBS (irritable bowel syndrome) 01/05/2015  . Biliary dyskinesia 01/05/2015  . Recent NSTEMI (Taktosubo event 12/30/14) 12/29/2014  . GERD (gastroesophageal reflux disease) 03/21/2012    Past Surgical History:  Procedure Laterality Date  . BACK SURGERY    . BREAST BIOPSY Bilateral early 2000's  . CARDIAC CATHETERIZATION  2006 (APPROX)  [place]   NORMAL  . CARDIAC CATHETERIZATION  12/30/2014  . CARDIAC CATHETERIZATION N/A 12/30/2014   Procedure: Left Heart Cath and Coronary Angiography;  Surgeon: Wellington Hampshire, MD;  Location: [facility];  Service: Cardiovascular;  Laterality: N/A;  . CARDIOVASCULAR STRESS TEST  03-21-2012  DR Johnsie Cancel   NORMAL NUCLEAR STUDY/  NO ISCHEMIA/  EF 63%  . CYSTOSCOPY W/ URETERAL STENT PLACEMENT Left 03/26/2013   Procedure: CYSTOSCOPY WITH RETROGRADE PYELOGRAM ;  Surgeon: Molli Hazard, MD;  Location: [facility];  Service: Urology;  Laterality: Left;  . CYSTOSCOPY WITH RETROGRADE PYELOGRAM, URETEROSCOPY AND STENT PLACEMENT Bilateral 03/19/2013   Procedure: CYSTOSCOPY WITH RETROGRADE PYELOGRAM, BILATERAL URETEROSCOPY AND STENT PLACEMENT LEFT URETER,BILATERAL STONE EXTRACTION , HOLMIUM LASER LEFT URETER;  Surgeon: Molli Hazard, MD;  Location: [facility];  Service: Urology;  Laterality: Bilateral;  . CYSTOSCOPY WITH STENT PLACEMENT Left 03/26/2013   Procedure: CYSTOSCOPY WITH STENT PLACEMENT;  Surgeon: Molli Hazard, MD;  Location:  [facility];  Service: Urology;  Laterality: Left;  . DIAGNOSTIC LAPAROSCOPY  04-12-2009  . ESOPHAGOGASTRODUODENOSCOPY (EGD) WITH PROPOFOL N/A 02/10/2016   Procedure: ESOPHAGOGASTRODUODENOSCOPY (EGD) WITH PROPOFOL;  Surgeon: Ronald Lobo, MD;  Location: [facility];  Service: Endoscopy;  Laterality: N/A;  . KIDNEY SURGERY  1966   BILATERAL URETER'S DILATATION  . LUMBAR LAMINECTOMY/DECOMPRESSION MICRODISCECTOMY  05/18/2011   Procedure: LUMBAR LAMINECTOMY/DECOMPRESSION MICRODISCECTOMY;  Surgeon: Johnn Hai;  Location: [facility];  Service: Orthopedics;  Laterality: Right;  Decompression Lumbar 4-Lumbar 5  Right    (xray)   . MELANOMA EXCISION WITH SENTINEL LYMPH NODE BIOPSY  2001   moh's procedure/  RIGHT FOREHEAD AND UPPER EYEBROW  . RIGHT LATERAL PAROTIDECTOMY W/ NERVE DISSECTION / RIGHT MODIFIED RADICAL NECK DISSECTION SPARING SCM ELEVENTH NERVE AND INTERNAL JUGULAR VEIN  09-12-2006  DR DWIGHT BATES   DR DWIGHT BATES; "inside gland; lots of lymph nodes"    OB History    No data available       Home Medications    Prior to Admission medications   Medication Sig Start Date End Date Taking? Authorizing Provider  albuterol (PROVENTIL HFA;VENTOLIN HFA) 108 (90 Base) MCG/ACT inhaler Inhale 2 puffs into the lungs every 6 (six) hours as needed for wheezing or shortness of breath. 01/27/16  Yes Hawks, Christy A, FNP  aspirin 81 MG chewable tablet Chew 81 mg by mouth daily.   Yes [provider]  aspirin-acetaminophen-caffeine (EXCEDRIN MIGRAINE) [phone] MG tablet Take 2 tablets by mouth every 6 (six) hours as needed for headache.   Yes [provider]  atorvastatin (LIPITOR) 20 MG tablet Take 1 tablet (20 mg total) by mouth daily. 05/15/16  Yes Josue Hector, MD  Calcium Carb-Cholecalciferol (CALCIUM 600 + D) 600-200 MG-UNIT TABS Take 2 tablets by mouth every evening.    Yes [provider]  carvedilol (COREG) 3.125 MG tablet Take 1 tablet (3.125  mg total) by mouth 2 (two) times daily with a meal. Patient taking differently: Take 700 mg by mouth 2 (two) times daily with a meal.  05/15/16  Yes Josue Hector, MD  docusate sodium (COLACE) 100 MG capsule Take 1 capsule (100 mg total) by mouth every 12 (twelve) hours. Patient taking differently: Take 200 mg by mouth every 12 (twelve) hours.  08/22/16  Yes Julianne Rice, MD  fluticasone [facility]) 50 MCG/ACT nasal spray Place 1 spray into both nostrils as needed for allergies or rhinitis. 04/19/17  Yes Martin, Mary-Margaret, FNP  ketotifen (THERA TEARS ALLERGY) 0.025 % ophthalmic solution Apply 1 drop to eye daily as needed (for dry eye relief).   Yes [provider]  lisinopril (PRINIVIL,ZESTRIL) 2.5 MG tablet Take 1 tablet (2.5 mg total) by mouth daily. 05/15/16  Yes Josue Hector, MD  loratadine (CLARITIN) 10 MG tablet Take 10 mg by mouth daily.   Yes [provider]  magnesium oxide (MAG-OX) 400 MG tablet Take 400 mg by mouth every evening.    Yes [provider]  Multiple Vitamin (MULTIVITAMIN) tablet Take 1  tablet by mouth every evening.    Yes [provider]  nitroGLYCERIN (NITROSTAT) 0.4 MG SL tablet DISSOLVE ONE TABLET UNDER THE TONGUE EVERY 5 MINUTES AS NEEDED FOR CHEST PAIN.  DO NOT EXCEED A TOTAL OF 3 DOSES IN 15 MINUTES 03/28/17  Yes Burtis Junes, NP  pantoprazole (PROTONIX) 40 MG tablet Take 40 mg by mouth 2 (two) times daily. 07/18/16  Yes [provider]  TRULANCE 3 MG TABS Take 3 mg by mouth every evening.  03/06/17  Yes [provider]    Family History Family History  Problem Relation Age of Onset  . Hypertension Mother   . COPD Mother   . Cancer Mother        breast  . Dementia Mother   . Heart disease Father   . Cancer Father        Colorectal  . Hyperlipidemia Father   . Hypertension Father     Social History Social History   Tobacco Use  . Smoking status: Never Smoker  . Smokeless tobacco: Never  Used  Substance Use Topics  . Alcohol use: Yes    Comment: 12/30/2014 "might have a beer or glass of wine maybe once/month"  . Drug use: No     Allergies   Ativan [lorazepam] and Cephalexin   Review of Systems Review of Systems  Respiratory: Positive for shortness of breath.   Cardiovascular: Positive for chest pain.  Gastrointestinal: Positive for nausea.  All other systems reviewed and are negative.    Physical Exam Updated Vital Signs BP 134/89 (BP Location: Right Arm)   Pulse 79   Temp 98.3 F (36.8 C)   Resp 18   LMP 05/18/2011   SpO2 99%   Physical Exam  Constitutional: She is oriented to person, place, and time. She appears well-developed and well-nourished. No distress.  HENT:  Head: Normocephalic and atraumatic.  Eyes: Conjunctivae and EOM are normal. Pupils are equal, round, and reactive to light.  Neck: Normal range of motion. Neck supple.  Cardiovascular: Normal rate, regular rhythm and intact distal pulses.  Pulmonary/Chest: Effort normal and breath sounds normal. No respiratory distress. She has no wheezes.  Abdominal: Soft. Bowel sounds are normal. She exhibits no distension and no mass. There is no tenderness. There is no guarding.  Musculoskeletal: Normal range of motion. She exhibits no tenderness.  No leg pain or swelling  Neurological: She is alert and oriented to person, place, and time.  Skin: Skin is warm and dry.  Psychiatric: She has a normal mood and affect.  Nursing note and vitals reviewed.    ED Treatments / Results  Labs (all labs ordered are listed, but only abnormal results are displayed) Labs Reviewed  CBC - Abnormal; Notable for the following components:      Result Value   WBC 3.3 (*)    All other components within normal limits  BASIC METABOLIC PANEL  I-STAT TROPONIN, ED  I-STAT BETA HCG BLOOD, ED ([facility], [facility], [facility] ONLY)  I-STAT TROPONIN, ED    EKG  EKG Interpretation  Date/Time:  Wednesday May 23 2017 12:21:45  EST Ventricular Rate:  87 PR Interval:  152 QRS Duration: 74 QT Interval:  356 QTC Calculation: 428 R Axis:   -37 Text Interpretation:  Normal sinus rhythm Left axis deviation Low voltage QRS Cannot rule out Anterior infarct , age undetermined Abnormal ECG Confirmed by Quintella Reichert [phone]) on 05/23/2017 6:29:00 PM       Radiology Dg Chest 2 View  Result Date: 05/23/2017 CLINICAL DATA:  Chest pain, shortness of breath, hypertension EXAM: CHEST  2 VIEW COMPARISON:  CT chest 05/15/2017, chest x-ray 06/21/2015 FINDINGS: The heart size and mediastinal contours are within normal limits. Both lungs are clear. The visualized skeletal structures are unremarkable. IMPRESSION: No active cardiopulmonary disease. Electronically Signed   By: Kathreen Devoid   On: 05/23/2017 13:20    Procedures Procedures (including critical care time)  Medications Ordered in ED Medications  gi cocktail (Maalox,Lidocaine,Donnatal) (30 mLs Oral Given 05/23/17 1840)  acetaminophen (TYLENOL) tablet 1,000 mg (1,000 mg Oral Given 05/23/17 1840)     Initial Impression / Assessment and Plan / ED Course  I have reviewed the triage vital signs and the nursing notes.  Pertinent labs & imaging results that were available during my care of the patient were reviewed by me and considered in my medical decision making (see chart for details).     Patient presenting for evaluation of chest pain.  Physical exam reassuring, she is afebrile and not tachycardic.  Currently without chest pain.  Basic labs reassuring, troponin negative.  Chest x-ray without infiltrate or other acute abnormality.  EKG baseline.  Will obtain delta troponin, and give GI cocktail for chest pain and Tylenol for headache.  Headache likely due to nitro use.   Repeat troponin negative.  Doubt ACS, PE, or infection as cause of pain.  ?GERD versus anxiety.  Discussed with attending, and Dr. Ralene Bathe evaluated the pt. Discussed findings with pt. Pt to f/u with  cardiology. At this time, patient appears to be discharge.  Return precautions given.  Patient states she understands and agrees to plan.   Final Clinical Impressions(s) / ED Diagnoses   Final diagnoses:  Nonspecific chest pain    ED Discharge Orders    None       Franchot Heidelberg, PA-C 05/24/17 0132    Quintella Reichert, MD 05/27/17 [phone]

## 2017-05-23 NOTE — Discharge Instructions (Signed)
Continue to take all your medications as prescribed. Use Tylenol or ibuprofen as needed for pain. When taking nitroglycerin, do not take multiple pills at once, this could have severe side effects. It is important that you follow-up with your cardiologist for further evaluation of your pain. Return to the emergency room if you develop persistent chest pain, difficulty breathing, or any new or worsening symptoms.

## 2017-05-23 NOTE — ED Triage Notes (Signed)
Pt arrives from upstairs where shew was visiting with her husband, states while sitting at his bedside she began having sudden onset of left sided chest pain. Pt took 3 sl nitro with no relief. Has hx of abnormal ekg. Denies sob or pain to arms or neck but pain does radiate to her back. Pt tearful in triage. resp e/u, nad.

## 2017-05-26 ENCOUNTER — Ambulatory Visit
Admission: RE | Admit: 2017-05-26 | Discharge: 2017-05-26 | Disposition: A | Discharge status: Home / Self Care | Payer: BLUE CROSS/BLUE SHIELD | Source: Ambulatory Visit | Attending: Physician Assistant | Admitting: Physician Assistant

## 2017-05-26 DIAGNOSIS — H905 Unspecified sensorineural hearing loss: Secondary | ICD-10-CM

## 2017-05-26 MED ORDER — GADOBENATE DIMEGLUMINE 529 MG/ML IV SOLN
20.0000 mL | Freq: Once | INTRAVENOUS | Status: AC | PRN
  Administered 2017-05-26: 20 mL via INTRAVENOUS

## 2017-05-29 HISTORY — PX: COLONOSCOPY WITH PROPOFOL: SHX5780

## 2017-06-20 DIAGNOSIS — H6991 Unspecified Eustachian tube disorder, right ear: Secondary | ICD-10-CM | POA: Insufficient documentation

## 2017-06-21 ENCOUNTER — Other Ambulatory Visit: Payer: Self-pay | Admitting: Cardiovascular Disease

## 2017-07-05 ENCOUNTER — Ambulatory Visit (INDEPENDENT_AMBULATORY_CARE_PROVIDER_SITE_OTHER): Payer: BLUE CROSS/BLUE SHIELD | Admitting: Family Medicine

## 2017-07-05 ENCOUNTER — Encounter: Payer: Self-pay | Admitting: Family Medicine

## 2017-07-05 VITALS — BP 134/76 | HR 82 | Temp 97.1°F | Ht 64.0 in | Wt 203.5 lb

## 2017-07-05 DIAGNOSIS — I10 Essential (primary) hypertension: Secondary | ICD-10-CM | POA: Diagnosis not present

## 2017-07-05 DIAGNOSIS — E782 Mixed hyperlipidemia: Secondary | ICD-10-CM

## 2017-07-05 DIAGNOSIS — E669 Obesity, unspecified: Secondary | ICD-10-CM | POA: Diagnosis not present

## 2017-07-05 DIAGNOSIS — K219 Gastro-esophageal reflux disease without esophagitis: Secondary | ICD-10-CM

## 2017-07-05 DIAGNOSIS — E559 Vitamin D deficiency, unspecified: Secondary | ICD-10-CM

## 2017-07-05 NOTE — Progress Notes (Signed)
BP 134/76   Pulse 82   Temp (!) 97.1 F (36.2 C) (Oral)   Ht '5\' 4"'$  (1.626 m)   Wt 203 lb 8 oz (92.3 kg)   LMP 05/18/2011   BMI 34.93 kg/m    Subjective:    Patient ID: [name], female    DOB: [birth year], 59 y.o.   MRN: [id number]  HPI: [name] is a 57 y.o. female presenting on 07/05/2017 for Hyperlipidemia (6 mo follow up); Hypertension; and Discuss Vitamin D (patient takes calcium with D, would like to know what her level is and if she may need 50,000 units)   HPI Hypertension Patient is currently on lisinopril and carvedilol, and their blood pressure today is 134/76. Patient denies any lightheadedness or dizziness. Patient denies headaches, blurred vision, chest pains, shortness of breath, or weakness. Denies any side effects from medication and is content with current medication.   Hyperlipidemia Patient is coming in for recheck of his hyperlipidemia. The patient is currently taking atorvastatin. They deny any issues with myalgias or history of liver damage from it. They deny any focal numbness or weakness or chest pain.   GERD Patient is currently on Protonix.  She denies any major symptoms or abdominal pain or belching or burping. She denies any blood in her stool or lightheadedness or dizziness.   Vitamin D deficiency Patient is coming in for recheck of vitamin D deficiency she is taking a daily supplement and is wondering if it is working or if she needs more.  Obesity and weight issues Patient comes in complaining of obesity and weight issues and says she is just not been able to lose weight like she feels like she should be able to.  She has had head and neck radiation and would like to have her thyroid checked to see if it is an issue she is also been a refocus on exercising and decreasing portion sizing.  Relevant past medical, surgical, family and social history reviewed and updated as indicated. Interim medical history since our last visit  reviewed. Allergies and medications reviewed and updated.  Review of Systems  Constitutional: Negative for chills and fever.  Eyes: Negative for visual disturbance.  Respiratory: Negative for chest tightness and shortness of breath.   Cardiovascular: Negative for chest pain and leg swelling.  Musculoskeletal: Negative for back pain and gait problem.  Skin: Negative for rash.  Neurological: Negative for dizziness, light-headedness and headaches.  Psychiatric/Behavioral: Negative for agitation and behavioral problems.  All other systems reviewed and are negative.   Per HPI unless specifically indicated above   Allergies as of 07/05/2017      Reactions   Ativan [lorazepam] Shortness Of Breath, Other (See Comments)   She may be very sensitive to benzo.    Cephalexin Shortness Of Breath, Rash      Medication List        Accurate as of 07/05/17  8:55 AM. Always use your most recent med list.          albuterol 108 (90 Base) MCG/ACT inhaler Commonly known as:  PROVENTIL HFA;VENTOLIN HFA Inhale 2 puffs into the lungs every 6 (six) hours as needed for wheezing or shortness of breath.   aspirin 81 MG chewable tablet Chew 81 mg by mouth daily.   aspirin-acetaminophen-caffeine 250-250-65 MG tablet Commonly known as:  EXCEDRIN MIGRAINE Take 2 tablets by mouth every 6 (six) hours as needed for headache.   atorvastatin 20 MG tablet Commonly known as:  LIPITOR  TAKE ONE TABLET BY MOUTH ONCE DAILY   CALCIUM 600 + D 600-200 MG-UNIT Tabs Generic drug:  Calcium Carb-Cholecalciferol Take 2 tablets by mouth every evening.   carvedilol 3.125 MG tablet Commonly known as:  COREG TAKE ONE TABLET BY MOUTH TWICE DAILY WITH A MEAL   docusate sodium 100 MG capsule Commonly known as:  COLACE Take 1 capsule (100 mg total) by mouth every 12 (twelve) hours.   fluticasone 50 MCG/ACT nasal spray Commonly known as:  FLONASE Place 1 spray into both nostrils as needed for allergies or rhinitis.    lisinopril 2.5 MG tablet Commonly known as:  PRINIVIL,ZESTRIL TAKE ONE TABLET BY MOUTH ONCE DAILY   loratadine 10 MG tablet Commonly known as:  CLARITIN Take 10 mg by mouth daily.   magnesium oxide 400 MG tablet Commonly known as:  MAG-OX Take 400 mg by mouth every evening.   multivitamin tablet Take 1 tablet by mouth every evening.   nitroGLYCERIN 0.4 MG SL tablet Commonly known as:  NITROSTAT DISSOLVE ONE TABLET UNDER THE TONGUE EVERY 5 MINUTES AS NEEDED FOR CHEST PAIN.  DO NOT EXCEED A TOTAL OF 3 DOSES IN 15 MINUTES   pantoprazole 40 MG tablet Commonly known as:  PROTONIX Take 40 mg by mouth 2 (two) times daily.   THERATEARS ALLERGY 0.025 % ophthalmic solution Generic drug:  ketotifen Apply 1 drop to eye daily as needed (for dry eye relief).   TRULANCE 3 MG Tabs Generic drug:  Plecanatide Take 3 mg by mouth every evening.          Objective:    BP 134/76   Pulse 82   Temp (!) 97.1 F (36.2 C) (Oral)   Ht '5\' 4"'$  (1.626 m)   Wt 203 lb 8 oz (92.3 kg)   LMP 05/18/2011   BMI 34.93 kg/m   Wt Readings from Last 3 Encounters:  07/05/17 203 lb 8 oz (92.3 kg)  04/19/17 197 lb (89.4 kg)  04/13/17 196 lb 4 oz (89 kg)    Physical Exam  Constitutional: She is oriented to person, place, and time. She appears well-developed and well-nourished. No distress.  Eyes: Conjunctivae are normal.  Neck: Neck supple. No thyromegaly present.  Cardiovascular: Normal rate, regular rhythm, normal heart sounds and intact distal pulses.  No murmur heard. Pulmonary/Chest: Effort normal and breath sounds normal. No respiratory distress. She has no wheezes.  Musculoskeletal: Normal range of motion. She exhibits no edema or tenderness.  Lymphadenopathy:    She has no cervical adenopathy.  Neurological: She is alert and oriented to person, place, and time. Coordination normal.  Skin: Skin is warm and dry. No rash noted. She is not diaphoretic.  Psychiatric: She has a normal mood and  affect. Her behavior is normal.  Nursing note and vitals reviewed.       Assessment & Plan:   Problem List Items Addressed This Visit      Cardiovascular and Mediastinum   Essential (primary) hypertension - Primary (Chronic)   Relevant Orders   CMP14+EGFR     Digestive   GERD (gastroesophageal reflux disease)     Other   Hyperlipidemia (Chronic)   Relevant Orders   Lipid panel    Other Visit Diagnoses    Vitamin D deficiency       Relevant Orders   VITAMIN D 25 Hydroxy (Vit-D Deficiency, Fractures)   Obesity (BMI 30.0-34.9)       Relevant Orders   TSH       Follow  up plan: Return in about 6 months (around 01/02/2018), or if symptoms worsen or fail to improve, for Recheck cholesterol.  Counseling provided for all of the vaccine components Orders Placed This Encounter  Procedures  . CMP14+EGFR  . Lipid panel  . VITAMIN D 25 Hydroxy (Vit-D Deficiency, Fractures)    Caryl Pina, MD [facility] 07/05/2017, 8:55 AM

## 2017-07-06 LAB — CMP14+EGFR
ALT: 21 IU/L (ref 0–32)
AST: 23 IU/L (ref 0–40)
Albumin/Globulin Ratio: 2.1 (ref 1.2–2.2)
Albumin: 4.8 g/dL (ref 3.5–5.5)
Alkaline Phosphatase: 75 IU/L (ref 39–117)
BUN/Creatinine Ratio: 23 (ref 9–23)
BUN: 19 mg/dL (ref 6–24)
Bilirubin Total: 0.3 mg/dL (ref 0.0–1.2)
CALCIUM: 9.3 mg/dL (ref 8.7–10.2)
CO2: 26 mmol/L (ref 20–29)
CREATININE: 0.81 mg/dL (ref 0.57–1.00)
Chloride: 103 mmol/L (ref 96–106)
GFR calc Af Amer: 93 mL/min/{1.73_m2} (ref >59)
GFR, EST NON AFRICAN AMERICAN: 80 mL/min/{1.73_m2} (ref >59)
GLOBULIN, TOTAL: 2.3 g/dL (ref 1.5–4.5)
GLUCOSE: 103 mg/dL — AB (ref 65–99)
POTASSIUM: 4.5 mmol/L (ref 3.5–5.2)
SODIUM: 145 mmol/L — AB (ref 134–144)
Total Protein: 7.1 g/dL (ref 6.0–8.5)

## 2017-07-06 LAB — LIPID PANEL
Chol/HDL Ratio: 3.4 ratio (ref 0.0–4.4)
Cholesterol, Total: 208 mg/dL — ABNORMAL HIGH (ref 100–199)
HDL: 61 mg/dL (ref >39)
LDL Calculated: 124 mg/dL — ABNORMAL HIGH (ref 0–99)
Triglycerides: 113 mg/dL (ref 0–149)
VLDL Cholesterol Cal: 23 mg/dL (ref 5–40)

## 2017-07-06 LAB — VITAMIN D 25 HYDROXY (VIT D DEFICIENCY, FRACTURES): Vit D, 25-Hydroxy: 37 ng/mL (ref 30.0–100.0)

## 2017-07-06 LAB — TSH: TSH: 2.36 u[IU]/mL (ref 0.450–4.500)

## 2017-08-06 ENCOUNTER — Encounter: Payer: Self-pay | Admitting: Nurse Practitioner

## 2017-08-06 ENCOUNTER — Ambulatory Visit (INDEPENDENT_AMBULATORY_CARE_PROVIDER_SITE_OTHER): Payer: BLUE CROSS/BLUE SHIELD | Admitting: Nurse Practitioner

## 2017-08-06 VITALS — BP 123/67 | HR 85 | Temp 97.3°F | Ht 64.0 in | Wt 206.0 lb

## 2017-08-06 DIAGNOSIS — H66002 Acute suppurative otitis media without spontaneous rupture of ear drum, left ear: Secondary | ICD-10-CM

## 2017-08-06 MED ORDER — AMOXICILLIN-POT CLAVULANATE 875-125 MG PO TABS: 1.0000 | ORAL_TABLET | Freq: Two times a day (BID) | ORAL | 0 refills | Status: DC

## 2017-08-06 MED ORDER — CIPROFLOXACIN-DEXAMETHASONE 0.3-0.1 % OT SUSP: 4.0000 [drp] | Freq: Two times a day (BID) | OTIC | 0 refills | Status: DC

## 2017-08-06 NOTE — Patient Instructions (Signed)
Ear Drainage  Ear drainage means that ear wax, pus, blood, or other fluid comes out of the ear (discharge).  Follow these instructions at home:  Pay attention to any changes in your ear drainage. Take these actions to help with your condition:  ? Take over-the-counter and prescription medicines only as told by your doctor.  ? Do not use cotton-tipped swabs in your ear. Do not put any other objects in your ear.  ? Do not swim until your doctor says it is okay.  ? Before you shower, cover a cotton ball with petroleum jelly and put that in your ear. This helps to keep water out of your ear.  ? Avoid being around smoke.  ? Wash your hands before and after you touch your ears.  ? Keep all follow-up visits as told by your doctor. This is important.    Contact a doctor if:  ? You have more drainage.  ? You have ear pain.  ? You have a fever.  ? Your drainage is not getting better with treatment.  ? Your ear drainage is bloody, white, clear, or yellow.  ? Your ear is red or swollen.  Get help right away if:  ? You have very bad ear pain.  ? You have a very bad headache.  ? You throw up (vomit).  ? You feel dizzy.  ? You have a seizure.  ? You have new hearing loss.  This information is not intended to replace advice given to you by your health care provider. Make sure you discuss any questions you have with your health care provider.  Document Released: 11/23/2009 Document Revised: 11/11/2015 Document Reviewed: 09/08/2014  Elsevier Interactive Patient Education ? 2018 [employer]

## 2017-08-06 NOTE — Progress Notes (Signed)
   Subjective:    Patient ID: [name], female    DOB: 04-Sep-1960, 57 y.o.   MRN: [id number]  HPI Patient come sin today c/o sore throat, sneezing and right ear pain and drainage. She has a vent tube in that ear. She taes an allergy pill dialy.   Review of Systems  Constitutional: Negative.  Negative for appetite change and fever.  HENT: Positive for congestion, ear pain, sneezing, sore throat and trouble swallowing.   Respiratory: Negative for cough and shortness of breath.   Cardiovascular: Negative.   Genitourinary: Negative.   Neurological: Negative.   Psychiatric/Behavioral: Negative.   All other systems reviewed and are negative.      Objective:   Physical Exam  Constitutional: She is oriented to person, place, and time. She appears well-developed and well-nourished. She appears distressed (mild).  HENT:  Right Ear: Hearing normal. A middle ear effusion (pus in right ear- unable to visualize the tm or vent tube) is present.  Left Ear: Hearing, tympanic membrane, external ear and ear canal normal.  Nose: Mucosal edema and rhinorrhea present. Right sinus exhibits no maxillary sinus tenderness and no frontal sinus tenderness. Left sinus exhibits no maxillary sinus tenderness and no frontal sinus tenderness.  Neck: Normal range of motion. Neck supple.  Cardiovascular: Normal rate and regular rhythm.  Pulmonary/Chest: Effort normal and breath sounds normal.  Dry cough   Lymphadenopathy:    She has no cervical adenopathy.  Neurological: She is alert and oriented to person, place, and time.  Skin: Skin is warm.  Psychiatric: She has a normal mood and affect. Her behavior is normal. Judgment and thought content normal.   BP 123/67   Pulse 85   Temp (!) 97.3 F (36.3 C) (Oral)   Ht 5\' 4"  (1.626 m)   Wt 206 lb (93.4 kg)   LMP 05/18/2011   BMI 35.36 kg/m         Assessment & Plan:  1. Acute suppurative otitis media of left ear without spontaneous rupture of  tympanic membrane, recurrence not specified Force fluids Rest Follow up with ENT about ear - amoxicillin-clavulanate (AUGMENTIN) 875-125 MG tablet; Take 1 tablet by mouth 2 (two) times daily.  Dispense: 20 tablet; Refill: 0 - ciprofloxacin-dexamethasone (CIPRODEX) OTIC suspension; Place 4 drops into the right ear 2 (two) times daily.  Dispense: 7.5 mL; Refill: 0  Mary-Margaret Hassell Done, FNP

## 2017-08-07 ENCOUNTER — Telehealth: Payer: Self-pay | Admitting: Family Medicine

## 2017-08-07 NOTE — Telephone Encounter (Signed)
Pt aware all lab results normal from visit in January.

## 2017-08-16 DIAGNOSIS — Z9622 Myringotomy tube(s) status: Secondary | ICD-10-CM | POA: Insufficient documentation

## 2017-09-26 ENCOUNTER — Other Ambulatory Visit: Payer: Self-pay | Admitting: Obstetrics and Gynecology

## 2017-09-26 DIAGNOSIS — Z1231 Encounter for screening mammogram for malignant neoplasm of breast: Secondary | ICD-10-CM

## 2017-10-10 ENCOUNTER — Encounter: Payer: Self-pay | Admitting: Cardiovascular Disease

## 2017-10-19 NOTE — Progress Notes (Deleted)
CARDIOLOGY OFFICE NOTE  Date:  10/19/2017    [name] Date of Birth: 11-Dec-1960 Medical Record #[id number]  PCP:  Dettinger, Fransisca Kaufmann, MD  Cardiologist:  Johnsie Cancel   No chief complaint on file.   History of Present Illness: [name] is a 57 y.o. female who presents for f/u Takatsubo DCM, Abnormal ECG, labile BP and chest pain   She has a history of chronically abnormal EKG. Remote normal cath in [facility] many years ago. Admitted in 2016 with chest pain - found to have Tkatsubo with EF 45% - no CAD by cath - MRI in 02/2015 showed normal EF. Hospital visit in 06/2015 for chest pain with negative evaluation.   Seen by PA 03/28/17 for chest pain. Sharp pain in chest radiating to left breast and back. Lasted 30 minutes and no full Relief with 3 nitro Troponin was negative  F/U cardiac CTA 05/15/17 normal with calcium score of 0  ***    Past Medical History:  Diagnosis Date  . Anxiety   . Arthritis    back (12/30/2014)  . Childhood asthma   . Daily headache   . Depression   . GERD (gastroesophageal reflux disease)   . History of palpitations    STRESS INDUCED  . Hypercholesterolemia dx'd 12/2014  . Hypertension   . Kidney cysts    "I think it was right"  . Kidney stones   . Left ureteral calculus   . 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 NSTEMI (non-ST elevated myocardial infarction) ([place]) 12/28/2014  . PONV (postoperative nausea and vomiting)   . Takotsubo syndrome    "Broken heart syndrome"    Past Surgical History:  Procedure Laterality Date  . BACK SURGERY    . BREAST BIOPSY Bilateral early 2000's  . CARDIAC CATHETERIZATION  2006 (APPROX)  [place]   NORMAL  .  CARDIAC CATHETERIZATION  12/30/2014  . CARDIAC CATHETERIZATION N/A 12/30/2014   Procedure: Left Heart Cath and Coronary Angiography;  Surgeon: Wellington Hampshire, MD;  Location: [facility];  Service: Cardiovascular;  Laterality: N/A;  . CARDIOVASCULAR STRESS TEST  03-21-2012  DR Johnsie Cancel   NORMAL NUCLEAR STUDY/  NO ISCHEMIA/  EF 63%  . CYSTOSCOPY W/ URETERAL STENT PLACEMENT Left 03/26/2013   Procedure: CYSTOSCOPY WITH RETROGRADE PYELOGRAM ;  Surgeon: Molli Hazard, MD;  Location[facility];  Service: Urology;  Laterality: Left;  . CYSTOSCOPY WITH RETROGRADE PYELOGRAM, URETEROSCOPY AND STENT PLACEMENT Bilateral 03/19/2013   Procedure: CYSTOSCOPY WITH RETROGRADE PYELOGRAM, BILATERAL URETEROSCOPY AND STENT PLACEMENT LEFT URETER,BILATERAL STONE EXTRACTION , HOLMIUM LASER LEFT URETER;  Surgeon: Molli Hazard, MD;  Location: [facility];  Service: Urology;  Laterality: Bilateral;  . CYSTOSCOPY WITH STENT PLACEMENT Left 03/26/2013   Procedure: CYSTOSCOPY WITH STENT PLACEMENT;  Surgeon: Molli Hazard, MD;  Location: [facility];  Service: Urology;  Laterality: Left;  . DIAGNOSTIC LAPAROSCOPY  04-12-2009  . ESOPHAGOGASTRODUODENOSCOPY (EGD) WITH PROPOFOL N/A 02/10/2016   Procedure: ESOPHAGOGASTRODUODENOSCOPY (EGD) WITH PROPOFOL;  Surgeon: Ronald Lobo, MD;  Location: [facility];  Service: Endoscopy;  Laterality: N/A;  . KIDNEY SURGERY  1966  BILATERAL URETER'S DILATATION  . LUMBAR LAMINECTOMY/DECOMPRESSION MICRODISCECTOMY  05/18/2011   Procedure: LUMBAR LAMINECTOMY/DECOMPRESSION MICRODISCECTOMY;  Surgeon: Johnn Hai;  Location: [facility];  Service: Orthopedics;  Laterality: Right;  Decompression Lumbar 4-Lumbar 5  Right    (xray)   . MELANOMA EXCISION WITH SENTINEL LYMPH NODE BIOPSY  2001   moh's procedure/  RIGHT FOREHEAD AND UPPER EYEBROW  . RIGHT LATERAL PAROTIDECTOMY W/ NERVE DISSECTION / RIGHT MODIFIED RADICAL NECK DISSECTION SPARING SCM ELEVENTH  NERVE AND INTERNAL JUGULAR VEIN  09-12-2006  DR DWIGHT BATES   DR DWIGHT BATES; "inside gland; lots of lymph nodes"     Medications: No outpatient medications have been marked as taking for the 10/25/17 encounter (Appointment) with Josue Hector, MD.     Allergies: Allergies  Allergen Reactions  . Ativan [Lorazepam] Shortness Of Breath and Other (See Comments)    She may be very sensitive to benzo.   . Cephalexin Shortness Of Breath and Rash    Social History: The patient  reports that she has never smoked. She has never used smokeless tobacco. She reports that she drinks alcohol. She reports that she does not use drugs.   Family History: The patient's family history includes COPD in her mother; Cancer in her father and mother; Dementia in her mother; Heart disease in her father; Hyperlipidemia in her father; Hypertension in her father and mother.   Review of Systems: Please see the history of present illness.   Otherwise, the review of systems is positive for none.   All other systems are reviewed and negative.   Physical Exam: VS:  LMP 05/18/2011  .  BMI There is no height or weight on file to calculate BMI.  Wt Readings from Last 3 Encounters:  08/06/17 206 lb (93.4 kg)  07/05/17 203 lb 8 oz (92.3 kg)  04/19/17 197 lb (89.4 kg)   Affect appropriate Healthy:  appears stated age 28: normal Neck supple with no adenopathy JVP normal no bruits no thyromegaly Lungs clear with no wheezing and good diaphragmatic motion Heart:  S1/S2 no murmur, no rub, gallop or click PMI normal Abdomen: benighn, BS positve, no tenderness, no AAA no bruit.  No HSM or HJR Distal pulses intact with no bruits No edema Neuro non-focal Skin warm and dry No muscular weakness    LABORATORY DATA:  EKG:  03/28/17  NSR with anterior T wave changes chronic .  Lab Results  Component Value Date   WBC 3.3 (L) 05/23/2017   HGB 12.8 05/23/2017   HCT 38.7 05/23/2017   PLT 195 05/23/2017    GLUCOSE 103 (H) 07/05/2017   CHOL 208 (H) 07/05/2017   TRIG 113 07/05/2017   HDL 61 07/05/2017   LDLCALC 124 (H) 07/05/2017   ALT 21 07/05/2017   AST 23 07/05/2017   NA 145 (H) 07/05/2017   K 4.5 07/05/2017   CL 103 07/05/2017   CREATININE 0.81 07/05/2017   BUN 19 07/05/2017   CO2 26 07/05/2017   TSH 2.360 07/05/2017   INR 1.10 12/30/2014   HGBA1C 5.5 12/29/2014     BNP (last 3 results) No results for input(s): BNP in the last 8760 hours.  ProBNP (last 3 results) No results for input(s): PROBNP in the last 8760 hours.   Other Studies Reviewed Today:  Cardiac MRI IMPRESSION 02/2015: 1) Normal LV size and function  2) Quantitative EF 64% no RWMA;s  3) No scar tissue or hyperenhancement  Overall findings consistent with Takatsubo DCM  Electronically Signed  By: Jenkins Rouge M.D.   On: 03/18/2015 16:52   Echo Study Conclusions from 12/2014  - Left ventricle: The cavity size was normal. Systolic function was   mildly reduced. The estimated ejection fraction was 45%. Diffuse   hypokinesis. Doppler parameters are consistent with abnormal left   ventricular relaxation (grade 1 diastolic dysfunction). There was   no evidence of elevated ventricular filling pressure by Doppler   parameters. - Aortic valve: Trileaflet; normal thickness leaflets. There was   trivial regurgitation. - Aortic root: The aortic root was normal in size. - Mitral valve: Structurally normal valve. There was no   regurgitation. - Left atrium: The atrium was normal in size. - Right ventricle: Systolic function was normal. - Right atrium: The atrium was normal in size. - Tricuspid valve: There was trivial regurgitation. - Pulmonic valve: There was no regurgitation. - Pulmonary arteries: Systolic pressure was within the normal   range. - Inferior vena cava: The vessel was normal in size. - Pericardium, extracardiac: There was no pericardial effusion.    Procedures   Left Heart Cath  and Coronary Angiography 12/2014  Conclusion   1. Normal coronary arteries. 2. Moderately to severely reduced LV systolic function with an ejection fraction of 30-35% with severe hypokinesis of the mid distal anterior, apical and mid to distal inferior walls consistent with stress-induced cardiomyopathy. 3. Mildly elevated left ventricular end-diastolic pressure.  Recommendations: Recommend a small dose beta blocker and ACE inhibitor. Avoid stress. Monitor for at least another day and possible discharge home tomorrow if she remains stable.     Assessment/Plan:  1. Chest pain:  Normal cardiac CTA 05/15/17 with calcium score 0  2. Elevated BP - improved continue current meds   3. History of Tatoksubo CM - EF 64% recovered by MRI 03/18/15 . She has no symptoms of CHF. Would keep on her current regimen for now.     Jenkins Rouge

## 2017-10-25 ENCOUNTER — Ambulatory Visit: Payer: BLUE CROSS/BLUE SHIELD | Admitting: Cardiovascular Disease

## 2017-10-25 ENCOUNTER — Ambulatory Visit
Admission: RE | Admit: 2017-10-25 | Discharge: 2017-10-25 | Disposition: A | Discharge status: Home / Self Care | Payer: BLUE CROSS/BLUE SHIELD | Source: Ambulatory Visit | Attending: Obstetrics and Gynecology | Admitting: Obstetrics and Gynecology

## 2017-10-25 DIAGNOSIS — Z1231 Encounter for screening mammogram for malignant neoplasm of breast: Secondary | ICD-10-CM

## 2017-10-29 ENCOUNTER — Encounter: Payer: Self-pay | Admitting: Family Medicine

## 2017-10-29 ENCOUNTER — Ambulatory Visit (INDEPENDENT_AMBULATORY_CARE_PROVIDER_SITE_OTHER): Payer: BLUE CROSS/BLUE SHIELD | Admitting: Family Medicine

## 2017-10-29 VITALS — BP 129/83 | HR 84 | Temp 97.9°F | Ht 64.0 in | Wt 202.0 lb

## 2017-10-29 DIAGNOSIS — H60331 Swimmer's ear, right ear: Secondary | ICD-10-CM

## 2017-10-29 DIAGNOSIS — J209 Acute bronchitis, unspecified: Secondary | ICD-10-CM

## 2017-10-29 MED ORDER — AMOXICILLIN-POT CLAVULANATE 875-125 MG PO TABS: 1.0000 | ORAL_TABLET | Freq: Two times a day (BID) | ORAL | 0 refills | Status: DC

## 2017-10-29 MED ORDER — ALBUTEROL SULFATE HFA 108 (90 BASE) MCG/ACT IN AERS
2.0000 | INHALATION_SPRAY | Freq: Four times a day (QID) | RESPIRATORY_TRACT | 0 refills | Status: DC | PRN
Start: 2017-10-29 — End: 2021-05-02

## 2017-10-29 NOTE — Progress Notes (Signed)
BP 129/83   Pulse 84   Temp 97.9 F (36.6 C) (Oral)   Ht 5\' 4"  (1.626 m)   Wt 202 lb (91.6 kg)   LMP 05/18/2011   SpO2 96%   BMI 34.67 kg/m    Subjective:    Patient ID: [name], female    DOB: Sep 29, 1960, 57 y.o.   MRN: [id number]  HPI: NYKERRIA MACCONNELL is a 57 y.o. female presenting on 10/29/2017 for Non-productive cough, right ear uncomfortable, sinus congest   HPI Cough and right ear pain and drainage and sinus congestion Patient has been having cough and right ear pain and drainage and sinus congestion that has been going on for the past 4 days.  She denies any fevers or chills but does feel like she has some shortness of breath and chest tightness and wheezing with this.  She has the cough but she cannot really clear much up with it.  She has used her Flonase and a decongestion without much success.  She does say that she was just in the hospital with her husband who ended up having a major abdominal surgery and may have been exposed to just about anything.  Relevant past medical, surgical, family and social history reviewed and updated as indicated. Interim medical history since our last visit reviewed. Allergies and medications reviewed and updated.  Review of Systems  Constitutional: Negative for chills and fever.  HENT: Positive for congestion, postnasal drip, rhinorrhea, sinus pressure and sore throat. Negative for ear discharge, ear pain and sneezing.   Eyes: Negative for pain, redness and visual disturbance.  Respiratory: Positive for cough, shortness of breath and wheezing. Negative for chest tightness.   Cardiovascular: Negative for chest pain and leg swelling.  Genitourinary: Negative for difficulty urinating and dysuria.  Musculoskeletal: Negative for back pain and gait problem.  Skin: Negative for rash.  Neurological: Negative for light-headedness and headaches.  Psychiatric/Behavioral: Negative for agitation and behavioral problems.  All other systems  reviewed and are negative.   Per HPI unless specifically indicated above   Allergies as of 10/29/2017      Reactions   Ativan [lorazepam] Shortness Of Breath, Other (See Comments)   She may be very sensitive to benzo.    Cephalexin Shortness Of Breath, Rash      Medication List        Accurate as of 10/29/17 12:16 PM. Always use your most recent med list.          albuterol 108 (90 Base) MCG/ACT inhaler Commonly known as:  PROVENTIL HFA;VENTOLIN HFA Inhale 2 puffs into the lungs every 6 (six) hours as needed for wheezing or shortness of breath.   amoxicillin-clavulanate 875-125 MG tablet Commonly known as:  AUGMENTIN Take 1 tablet by mouth 2 (two) times daily.   aspirin 81 MG chewable tablet Chew 81 mg by mouth daily.   aspirin-acetaminophen-caffeine 250-250-65 MG tablet Commonly known as:  EXCEDRIN MIGRAINE Take 2 tablets by mouth every 6 (six) hours as needed for headache.   atorvastatin 20 MG tablet Commonly known as:  LIPITOR TAKE ONE TABLET BY MOUTH ONCE DAILY   CALCIUM 600 + D 600-200 MG-UNIT Tabs Generic drug:  Calcium Carb-Cholecalciferol Take 2 tablets by mouth every evening.   carvedilol 3.125 MG tablet Commonly known as:  COREG TAKE ONE TABLET BY MOUTH TWICE DAILY WITH A MEAL   docusate sodium 100 MG capsule Commonly known as:  COLACE Take 1 capsule (100 mg total) by mouth every 12 (twelve)  hours.   lisinopril 2.5 MG tablet Commonly known as:  PRINIVIL,ZESTRIL TAKE ONE TABLET BY MOUTH ONCE DAILY   multivitamin tablet Take 1 tablet by mouth every evening.   nitroGLYCERIN 0.4 MG SL tablet Commonly known as:  NITROSTAT DISSOLVE ONE TABLET UNDER THE TONGUE EVERY 5 MINUTES AS NEEDED FOR CHEST PAIN.  DO NOT EXCEED A TOTAL OF 3 DOSES IN 15 MINUTES   pantoprazole 40 MG tablet Commonly known as:  PROTONIX Take 40 mg by mouth 2 (two) times daily.   THERATEARS ALLERGY 0.025 % ophthalmic solution Generic drug:  ketotifen Apply 1 drop to eye daily as  needed (for dry eye relief).          Objective:    BP 129/83   Pulse 84   Temp 97.9 F (36.6 C) (Oral)   Ht 5\' 4"  (1.626 m)   Wt 202 lb (91.6 kg)   LMP 05/18/2011   SpO2 96%   BMI 34.67 kg/m   Wt Readings from Last 3 Encounters:  10/29/17 202 lb (91.6 kg)  08/06/17 206 lb (93.4 kg)  07/05/17 203 lb 8 oz (92.3 kg)    Physical Exam  Constitutional: She is oriented to person, place, and time. She appears well-developed and well-nourished. No distress.  HENT:  Right Ear: External ear normal. There is drainage and swelling. No mastoid tenderness. No middle ear effusion (Unable to visualize tympanic membrane because of swelling and drainage and right ear canal).  Left Ear: Tympanic membrane, external ear and ear canal normal.  Nose: Mucosal edema and rhinorrhea present. No epistaxis. Right sinus exhibits no maxillary sinus tenderness and no frontal sinus tenderness. Left sinus exhibits no maxillary sinus tenderness and no frontal sinus tenderness.  Mouth/Throat: Uvula is midline and mucous membranes are normal. Posterior oropharyngeal edema and posterior oropharyngeal erythema present. No oropharyngeal exudate or tonsillar abscesses.  Eyes: Conjunctivae are normal.  Cardiovascular: Normal rate, regular rhythm, normal heart sounds and intact distal pulses.  No murmur heard. Pulmonary/Chest: Effort normal and breath sounds normal. No respiratory distress. She has no wheezes.  Neurological: She is alert and oriented to person, place, and time. Coordination normal.  Skin: Skin is warm and dry. No rash noted. She is not diaphoretic.  Psychiatric: She has a normal mood and affect. Her behavior is normal.  Vitals reviewed.     Assessment & Plan:   Problem List Items Addressed This Visit    None    Visit Diagnoses    Acute bronchitis, unspecified organism    -  Primary   Relevant Medications   amoxicillin-clavulanate (AUGMENTIN) 875-125 MG tablet   albuterol (PROVENTIL  HFA;VENTOLIN HFA) 108 (90 Base) MCG/ACT inhaler   Acute swimmer's ear of right side       Patient already has Ciprodex and recommended for her to go back on that.    Patient has previously tolerated Augmentin, will send that in for her along with an albuterol inhaler and she will start up on her Ciprodex drops that she has from ENT.  Follow up plan: Return if symptoms worsen or fail to improve.  Counseling provided for all of the vaccine components No orders of the defined types were placed in this encounter.   Caryl Pina, MD [facility] 10/29/2017, 12:16 PM

## 2017-11-05 ENCOUNTER — Telehealth: Payer: Self-pay | Admitting: Family Medicine

## 2017-11-05 MED ORDER — PREDNISONE 20 MG PO TABS: ORAL_TABLET | ORAL | 0 refills | Status: DC

## 2017-11-05 MED ORDER — LEVOFLOXACIN 500 MG PO TABS: 500.0000 mg | ORAL_TABLET | Freq: Every day | ORAL | 0 refills | Status: DC

## 2017-11-05 NOTE — Telephone Encounter (Signed)
Pt requested rx's be sent to [facility] in [place], rx sent and pt is aware.

## 2017-11-05 NOTE — Telephone Encounter (Signed)
Please review and advise.

## 2017-11-05 NOTE — Telephone Encounter (Signed)
I have pended a different antibiotic and a course of steroids which should help clear this, I do not have a pharmacy for her so you could call her and ask her what pharmacy and just signed and send it. Caryl Pina, MD [facility] 11/05/2017, 1:02 PM

## 2018-01-03 ENCOUNTER — Ambulatory Visit (INDEPENDENT_AMBULATORY_CARE_PROVIDER_SITE_OTHER): Payer: BLUE CROSS/BLUE SHIELD | Admitting: Family Medicine

## 2018-01-03 ENCOUNTER — Encounter: Payer: Self-pay | Admitting: Family Medicine

## 2018-01-03 VITALS — BP 139/82 | HR 76 | Temp 97.6°F | Ht 64.0 in | Wt 197.0 lb

## 2018-01-03 DIAGNOSIS — N200 Calculus of kidney: Secondary | ICD-10-CM | POA: Diagnosis not present

## 2018-01-03 DIAGNOSIS — E782 Mixed hyperlipidemia: Secondary | ICD-10-CM

## 2018-01-03 DIAGNOSIS — I10 Essential (primary) hypertension: Secondary | ICD-10-CM | POA: Diagnosis not present

## 2018-01-03 DIAGNOSIS — E669 Obesity, unspecified: Secondary | ICD-10-CM

## 2018-01-03 DIAGNOSIS — K219 Gastro-esophageal reflux disease without esophagitis: Secondary | ICD-10-CM | POA: Diagnosis not present

## 2018-01-03 LAB — MICROSCOPIC EXAMINATION: RENAL EPITHEL UA: NONE SEEN /HPF

## 2018-01-03 LAB — LIPID PANEL
CHOLESTEROL TOTAL: 146 mg/dL (ref 100–199)
Chol/HDL Ratio: 2.9 ratio (ref 0.0–4.4)
HDL: 50 mg/dL (ref >39)
LDL Calculated: 76 mg/dL (ref 0–99)
Triglycerides: 101 mg/dL (ref 0–149)
VLDL Cholesterol Cal: 20 mg/dL (ref 5–40)

## 2018-01-03 LAB — CBC WITH DIFFERENTIAL/PLATELET
BASOS: 0 %
Basophils Absolute: 0 10*3/uL (ref 0.0–0.2)
EOS (ABSOLUTE): 0.1 10*3/uL (ref 0.0–0.4)
Eos: 3 %
HEMATOCRIT: 41 % (ref 34.0–46.6)
Hemoglobin: 14.2 g/dL (ref 11.1–15.9)
Immature Grans (Abs): 0 10*3/uL (ref 0.0–0.1)
Immature Granulocytes: 0 %
LYMPHS: 41 %
Lymphocytes Absolute: 1.6 10*3/uL (ref 0.7–3.1)
MCH: 32.4 pg (ref 26.6–33.0)
MCHC: 34.6 g/dL (ref 31.5–35.7)
MCV: 94 fL (ref 79–97)
Monocytes Absolute: 0.4 10*3/uL (ref 0.1–0.9)
Monocytes: 9 %
NEUTROS ABS: 1.9 10*3/uL (ref 1.4–7.0)
Neutrophils: 47 %
PLATELETS: 170 10*3/uL (ref 150–450)
RBC: 4.38 x10E6/uL (ref 3.77–5.28)
RDW: 14.6 % (ref 12.3–15.4)
WBC: 4 10*3/uL (ref 3.4–10.8)

## 2018-01-03 LAB — URINALYSIS, COMPLETE
Bilirubin, UA: NEGATIVE
GLUCOSE, UA: NEGATIVE
KETONES UA: NEGATIVE
LEUKOCYTES UA: NEGATIVE
Nitrite, UA: NEGATIVE
Protein, UA: NEGATIVE
Specific Gravity, UA: 1.02 (ref 1.005–1.030)
Urobilinogen, Ur: 0.2 mg/dL (ref 0.2–1.0)
pH, UA: 5.5 (ref 5.0–7.5)

## 2018-01-03 LAB — CMP14+EGFR
A/G RATIO: 2.5 — AB (ref 1.2–2.2)
ALBUMIN: 4.7 g/dL (ref 3.5–5.5)
ALT: 19 IU/L (ref 0–32)
AST: 26 IU/L (ref 0–40)
Alkaline Phosphatase: 64 IU/L (ref 39–117)
BILIRUBIN TOTAL: 0.3 mg/dL (ref 0.0–1.2)
BUN / CREAT RATIO: 27 — AB (ref 9–23)
BUN: 22 mg/dL (ref 6–24)
CHLORIDE: 103 mmol/L (ref 96–106)
CO2: 26 mmol/L (ref 20–29)
Calcium: 9.8 mg/dL (ref 8.7–10.2)
Creatinine, Ser: 0.83 mg/dL (ref 0.57–1.00)
GFR calc non Af Amer: 77 mL/min/{1.73_m2} (ref >59)
GFR, EST AFRICAN AMERICAN: 89 mL/min/{1.73_m2} (ref >59)
GLOBULIN, TOTAL: 1.9 g/dL (ref 1.5–4.5)
Glucose: 96 mg/dL (ref 65–99)
POTASSIUM: 4.9 mmol/L (ref 3.5–5.2)
Sodium: 142 mmol/L (ref 134–144)
Total Protein: 6.6 g/dL (ref 6.0–8.5)

## 2018-01-03 MED ORDER — TAMSULOSIN HCL 0.4 MG PO CAPS: 0.4000 mg | ORAL_CAPSULE | Freq: Every day | ORAL | 0 refills | Status: DC

## 2018-01-03 MED ORDER — HYDROCODONE-ACETAMINOPHEN 5-325 MG PO TABS: 1.0000 | ORAL_TABLET | Freq: Four times a day (QID) | ORAL | 0 refills | Status: DC | PRN

## 2018-01-03 NOTE — Progress Notes (Signed)
BP 139/82   Pulse 76   Temp 97.6 F (36.4 C) (Oral)   Ht _0  (1.626 m)   Wt 197 lb (89.4 kg)   LMP 05/18/2011   BMI 33.81 kg/m    Subjective:    Patient ID: [name], female    DOB: 1961-01-22, 56 y.o.   MRN: [id number]  HPI: [name] is a 57 y.o. female presenting on 01/03/2018 for Hyperlipidemia; Hypertension; Gastroesophageal Reflux; and Dysuria, back pain (back pain x 2 weeks, dysuria this week; history of kidney stones)   HPI Hyperlipidemia Patient is coming in for recheck of his hyperlipidemia. The patient is currently taking Lipitor. They deny any issues with myalgias or history of liver damage from it. They deny any focal numbness or weakness or chest pain.   GERD Patient is currently on Protonix.  She denies any major symptoms or abdominal pain or belching or burping. She denies any blood in her stool or lightheadedness or dizziness.   Hypertension Patient is currently on carvedilol and lisinopril, and their blood pressure today is 139/82. Patient denies any lightheadedness or dizziness. Patient denies headaches, blurred vision, chest pains, shortness of breath, or weakness. Denies any side effects from medication and is content with current medication.  Patient has a history of MI and CAD and is on Lipitor  Right flank pain and queasiness and dysuria Patient comes in with complaints of right flank pain and nausea and dysuria that is been going on for the past couple days.  She feels like she either has an infection or stone.  She has had multiple episodes of stones previously.  He does not feel like this is as bad as some of her previous episodes.  Relevant past medical, surgical, family and social history reviewed and updated as indicated. Interim medical history since our last visit reviewed. Allergies and medications reviewed and updated.  Review of Systems  Constitutional: Negative for chills and fever.  Eyes: Negative for visual disturbance.    Respiratory: Negative for chest tightness and shortness of breath.   Cardiovascular: Negative for chest pain and leg swelling.  Gastrointestinal: Negative for abdominal pain.  Genitourinary: Positive for dysuria and flank pain. Negative for decreased urine volume, difficulty urinating, hematuria and urgency.  Musculoskeletal: Negative for back pain and gait problem.  Skin: Negative for rash.  Neurological: Negative for light-headedness and headaches.  Psychiatric/Behavioral: Negative for agitation and behavioral problems.  All other systems reviewed and are negative.   Per HPI unless specifically indicated above   Allergies as of 01/03/2018      Reactions   Ativan [lorazepam] Shortness Of Breath, Other (See Comments)   She may be very sensitive to benzo.    Cephalexin Shortness Of Breath, Rash   Cephalexin Rash, Shortness Of Breath      Medication List        Accurate as of 01/03/18  8:19 AM. Always use your most recent med list.          albuterol 108 (90 Base) MCG/ACT inhaler Commonly known as:  PROVENTIL HFA;VENTOLIN HFA Inhale 2 puffs into the lungs every 6 (six) hours as needed for wheezing or shortness of breath.   aspirin 81 MG chewable tablet Chew 81 mg by mouth daily.   aspirin-acetaminophen-caffeine 250-250-65 MG tablet Commonly known as:  EXCEDRIN MIGRAINE Take 2 tablets by mouth every 6 (six) hours as needed for headache.   atorvastatin 20 MG tablet Commonly known as:  LIPITOR TAKE ONE TABLET BY  MOUTH ONCE DAILY   CALCIUM 600 + D 600-200 MG-UNIT Tabs Generic drug:  Calcium Carb-Cholecalciferol Take 2 tablets by mouth every evening.   carvedilol 3.125 MG tablet Commonly known as:  COREG TAKE ONE TABLET BY MOUTH TWICE DAILY WITH A MEAL   docusate sodium 100 MG capsule Commonly known as:  COLACE Take 1 capsule (100 mg total) by mouth every 12 (twelve) hours.   levofloxacin 500 MG tablet Commonly known as:  LEVAQUIN Take 1 tablet (500 mg total) by  mouth daily.   lisinopril 2.5 MG tablet Commonly known as:  PRINIVIL,ZESTRIL TAKE ONE TABLET BY MOUTH ONCE DAILY   multivitamin tablet Take 1 tablet by mouth every evening.   nitroGLYCERIN 0.4 MG SL tablet Commonly known as:  NITROSTAT DISSOLVE ONE TABLET UNDER THE TONGUE EVERY 5 MINUTES AS NEEDED FOR CHEST PAIN.  DO NOT EXCEED A TOTAL OF 3 DOSES IN 15 MINUTES   pantoprazole 40 MG tablet Commonly known as:  PROTONIX Take 40 mg by mouth 2 (two) times daily.   THERATEARS ALLERGY 0.025 % ophthalmic solution Generic drug:  ketotifen Apply 1 drop to eye daily as needed (for dry eye relief).          Objective:    BP 139/82   Pulse 76   Temp 97.6 F (36.4 C) (Oral)   Ht _0  (1.626 m)   Wt 197 lb (89.4 kg)   LMP 05/18/2011   BMI 33.81 kg/m   Wt Readings from Last 3 Encounters:  01/03/18 197 lb (89.4 kg)  10/29/17 202 lb (91.6 kg)  08/06/17 206 lb (93.4 kg)    Physical Exam  Constitutional: She is oriented to person, place, and time. She appears well-developed and well-nourished. No distress.  Eyes: Conjunctivae are normal.  Cardiovascular: Normal rate, regular rhythm, normal heart sounds and intact distal pulses.  No murmur heard. Pulmonary/Chest: Effort normal and breath sounds normal. No respiratory distress. She has no wheezes.  Abdominal: Soft. Bowel sounds are normal. She exhibits no distension and no mass. There is no hepatosplenomegaly. There is no tenderness. There is CVA tenderness (right ). There is no rebound and no guarding.  Musculoskeletal: Normal range of motion. She exhibits no edema or tenderness.  Neurological: She is alert and oriented to person, place, and time. Coordination normal.  Skin: Skin is warm and dry. No rash noted. She is not diaphoretic.  Psychiatric: She has a normal mood and affect. Her behavior is normal.  Nursing note and vitals reviewed.   Urinalysis: 0-5 WBCs, 3-10 RBCs, 0-10 epithelial cells, few bacteria, 1+ blood      Assessment & Plan:   Problem List Items Addressed This Visit      Cardiovascular and Mediastinum   Essential (primary) hypertension - Primary (Chronic)   Relevant Orders   CMP14+EGFR     Digestive   GERD (gastroesophageal reflux disease)   Relevant Orders   CBC with Differential/Platelet     Other   Hyperlipidemia (Chronic)   Relevant Orders   Lipid panel   Obesity (BMI 30.0-34.9)    Other Visit Diagnoses    Renal stone       Relevant Medications   tamsulosin (FLOMAX) 0.4 MG CAPS capsule   HYDROcodone-acetaminophen (NORCO/VICODIN) 5-325 MG tablet   Other Relevant Orders   Urinalysis, Complete     Urinalysis shows more blood than anything, consistent with renal stones, we will treat like it is a renal stone.  If worsens go to the emergency department.  Follow  up plan: Return in about 6 months (around 07/06/2018), or if symptoms worsen or fail to improve, for Hypertension and GERD and cholesterol recheck.  Counseling provided for all of the vaccine components Orders Placed This Encounter  Procedures  . Urinalysis, Complete  . CBC with Differential/Platelet  . CMP14+EGFR  . Lipid panel    Caryl Pina, MD [facility] 01/03/2018, 8:19 AM

## 2018-01-03 NOTE — Patient Instructions (Signed)
46-200 g of protein daily is recommendation

## 2018-02-14 NOTE — Progress Notes (Signed)
CARDIOLOGY OFFICE NOTE  Date:  02/21/2018    [name] Date of Birth: [birth year] Medical Record #[id number]  PCP:  Dettinger, Fransisca Kaufmann, MD  Cardiologist:  Johnsie Cancel   No chief complaint on file.   History of Present Illness: [name] is a 57 y.o. female who presents for f/u Takatsubo DCM, Abnormal ECG, labile BP and chest pain   She has a history of chronically abnormal EKG. Remote normal cath in [facility] many years ago. Admitted in 2016 with chest pain - found to have Tkatsubo with EF 45% - no CAD by cath - MRI in 02/2015 showed normal EF. Hospital visit in 06/2015 for chest pain with negative evaluation.   Seen by PA 03/28/17 for chest pain. Sharp pain in chest radiating to left breast and back. Lasted 30 minutes and no full Relief with 3 nitro Troponin was negative  Still getting "twinges" of pain that are not likely cardiac more likely gas/GI She has a rash this am Likely her dog brought something in from yard      Past Medical History:  Diagnosis Date  . Anxiety   . Arthritis    back (12/30/2014)  . Childhood asthma   . Daily headache   . Depression   . GERD (gastroesophageal reflux disease)   . History of palpitations    STRESS INDUCED  . Hypercholesterolemia dx'd 12/2014  . Hypertension   . Kidney cysts    "I think it was right"  . Kidney stones   . Left ureteral calculus   . Malignant melanoma of skin of eyebrow ([place]) 2001    RIGHT SUPRAORBITAL (RIGHT FOREHEAD AND UPPER EYELID ----  S/P MOHS PROCDURE W/ SLN BX----   NO RECURRENCE  . 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 NSTEMI (non-ST elevated myocardial infarction) ([place]) 12/28/2014  . PONV (postoperative nausea and vomiting)   . Takotsubo syndrome    "Broken heart syndrome"    Past Surgical History:  Procedure Laterality Date  . BACK SURGERY    . BREAST BIOPSY  Bilateral early 2000's  . CARDIAC CATHETERIZATION  2006 (APPROX)  [place]   NORMAL  . CARDIAC CATHETERIZATION  12/30/2014  . CARDIAC CATHETERIZATION N/A 12/30/2014   Procedure: Left Heart Cath and Coronary Angiography;  Surgeon: Wellington Hampshire, MD;  Location: [place] [facility];  Service: Cardiovascular;  Laterality: N/A;  . CARDIOVASCULAR STRESS TEST  03-21-2012  DR Johnsie Cancel   NORMAL NUCLEAR STUDY/  NO ISCHEMIA/  EF 63%  . CYSTOSCOPY W/ URETERAL STENT PLACEMENT Left 03/26/2013   Procedure: CYSTOSCOPY WITH RETROGRADE PYELOGRAM ;  Surgeon: Molli Hazard, MD;  Location: [facility];  Service: Urology;  Laterality: Left;  . CYSTOSCOPY WITH RETROGRADE PYELOGRAM, URETEROSCOPY AND STENT PLACEMENT Bilateral 03/19/2013   Procedure: CYSTOSCOPY WITH RETROGRADE PYELOGRAM, BILATERAL URETEROSCOPY AND STENT PLACEMENT LEFT URETER,BILATERAL STONE EXTRACTION , HOLMIUM LASER LEFT URETER;  Surgeon: Molli Hazard, MD;  Location: [facility];  Service: Urology;  Laterality: Bilateral;  . CYSTOSCOPY WITH STENT PLACEMENT Left 03/26/2013   Procedure: CYSTOSCOPY WITH STENT PLACEMENT;  Surgeon: Molli Hazard, MD;  Location: [facility];  Service: Urology;  Laterality: Left;  . DIAGNOSTIC LAPAROSCOPY  04-12-2009  . ESOPHAGOGASTRODUODENOSCOPY (EGD) WITH PROPOFOL N/A 02/10/2016   Procedure: ESOPHAGOGASTRODUODENOSCOPY (EGD) WITH PROPOFOL;  Surgeon: Ronald Lobo, MD;  [facility];  Service: Endoscopy;  Laterality: N/A;  . KIDNEY SURGERY  1966   BILATERAL URETER'S DILATATION  . LUMBAR LAMINECTOMY/DECOMPRESSION MICRODISCECTOMY  05/18/2011   Procedure: LUMBAR LAMINECTOMY/DECOMPRESSION MICRODISCECTOMY;  Surgeon: Johnn Hai;  Location: [facility];  Service: Orthopedics;  Laterality: Right;  Decompression Lumbar 4-Lumbar 5  Right    (xray)   . MELANOMA EXCISION WITH SENTINEL LYMPH NODE BIOPSY  2001   moh's procedure/  RIGHT FOREHEAD AND UPPER EYEBROW  . RIGHT LATERAL  PAROTIDECTOMY W/ NERVE DISSECTION / RIGHT MODIFIED RADICAL NECK DISSECTION SPARING SCM ELEVENTH NERVE AND INTERNAL JUGULAR VEIN  09-12-2006  DR DWIGHT BATES   DR DWIGHT BATES; "inside gland; lots of lymph nodes"     Medications: Current Meds  Medication Sig  . albuterol (PROVENTIL HFA;VENTOLIN HFA) 108 (90 Base) MCG/ACT inhaler Inhale 2 puffs into the lungs every 6 (six) hours as needed for wheezing or shortness of breath.  Marland Kitchen aspirin 81 MG chewable tablet Chew 81 mg by mouth daily.  Marland Kitchen aspirin-acetaminophen-caffeine (EXCEDRIN MIGRAINE) 250-250-65 MG tablet Take 2 tablets by mouth every 6 (six) hours as needed for headache.  Marland Kitchen atorvastatin (LIPITOR) 20 MG tablet TAKE ONE TABLET BY MOUTH ONCE DAILY  . Calcium Carb-Cholecalciferol (CALCIUM 600 + D) 600-200 MG-UNIT TABS Take 2 tablets by mouth every evening.   . carvedilol (COREG) 3.125 MG tablet TAKE ONE TABLET BY MOUTH TWICE DAILY WITH A MEAL  . docusate sodium (COLACE) 100 MG capsule Take 100 mg by mouth as directed.  Marland Kitchen HYDROcodone-acetaminophen (NORCO/VICODIN) 5-325 MG tablet Take 1 tablet by mouth every 6 (six) hours as needed for moderate pain.  Marland Kitchen ketotifen (THERA TEARS ALLERGY) 0.025 % ophthalmic solution Apply 1 drop to eye daily as needed (for dry eye relief).  Marland Kitchen lisinopril (PRINIVIL,ZESTRIL) 2.5 MG tablet TAKE ONE TABLET BY MOUTH ONCE DAILY  . Multiple Vitamin (MULTIVITAMIN) tablet Take 1 tablet by mouth every evening.   . nitroGLYCERIN (NITROSTAT) 0.4 MG SL tablet DISSOLVE ONE TABLET UNDER THE TONGUE EVERY 5 MINUTES AS NEEDED FOR CHEST PAIN.  DO NOT EXCEED A TOTAL OF 3 DOSES IN 15 MINUTES     Allergies: Allergies  Allergen Reactions  . Ativan [Lorazepam] Shortness Of Breath and Other (See Comments)    She may be very sensitive to benzo.   . Cephalexin Shortness Of Breath and Rash  . Cephalexin Rash and Shortness Of Breath    Social History: The patient  reports that she has never smoked. She has never used smokeless tobacco.  She reports that she drinks alcohol. She reports that she does not use drugs.   Family History: The patient's family history includes COPD in her mother; Cancer in her father and mother; Dementia in her mother; Heart disease in her father; Hyperlipidemia in her father; Hypertension in her father and mother.   Review of Systems: Please see the history of present illness.   Otherwise, the review of systems is positive for none.   All other systems are reviewed and negative.   Physical Exam: VS:  BP 134/74   Pulse 72   Ht 5\' 4"  (1.626 m)   Wt 199 lb 8 oz (90.5 kg)   LMP 05/18/2011   SpO2 98%   BMI 34.24 kg/m  .  BMI Body mass index is 34.24 kg/m.  Wt Readings from Last 3 Encounters:  02/21/18 199 lb 8 oz (90.5 kg)  01/03/18 197 lb (89.4 kg)  10/29/17 202 lb (91.6 kg)   Affect appropriate Healthy:  appears stated age 32: normal Neck supple with no adenopathy JVP normal no bruits no thyromegaly Lungs clear with no wheezing and good diaphragmatic motion Heart:  S1/S2 no murmur, no rub, gallop or click PMI normal Abdomen: benighn, BS positve, no tenderness, no AAA no bruit.  No HSM or HJR Distal pulses intact with no bruits No edema Neuro non-focal Skin warm and dry No muscular weakness    LABORATORY DATA:  EKG:  03/28/17  NSR with anterior T wave changes chronic .  [facility] Results  Component Value Date   WBC 4.0 01/03/2018   HGB 14.2 01/03/2018   HCT 41.0 01/03/2018   PLT 170 01/03/2018   GLUCOSE 96 01/03/2018   CHOL 146 01/03/2018   TRIG 101 01/03/2018   HDL 50 01/03/2018   LDLCALC 76 01/03/2018   ALT 19 01/03/2018   AST 26 01/03/2018   NA 142 01/03/2018   K 4.9 01/03/2018   CL 103 01/03/2018   CREATININE 0.83 01/03/2018   BUN 22 01/03/2018   CO2 26 01/03/2018   TSH 2.360 07/05/2017   INR 1.10 12/30/2014   HGBA1C 5.5 12/29/2014     BNP (last 3 results) No results for input(s): BNP in the last 8760 hours.  ProBNP (last 3 results) No results for  input(s): PROBNP in the last 8760 hours.   Other Studies Reviewed Today:  Cardiac MRI IMPRESSION 02/2015: 1) Normal LV size and function  2) Quantitative EF 64% no RWMA;s  3) No scar tissue or hyperenhancement  Overall findings consistent with Takatsubo DCM  Electronically Signed   By: Jenkins Rouge M.D.   On: 03/18/2015 16:52   Echo Study Conclusions from 12/2014  - Left ventricle: The cavity size was normal. Systolic function was   mildly reduced. The estimated ejection fraction was 45%. Diffuse   hypokinesis. Doppler parameters are consistent with abnormal left   ventricular relaxation (grade 1 diastolic dysfunction). There was   no evidence of elevated ventricular filling pressure by Doppler   parameters. - Aortic valve: Trileaflet; normal thickness leaflets. There was   trivial regurgitation. - Aortic root: The aortic root was normal in size. - Mitral valve: Structurally normal valve. There was no   regurgitation. - Left atrium: The atrium was normal in size. - Right ventricle: Systolic function was normal. - Right atrium: The atrium was normal in size. - Tricuspid valve: There was trivial regurgitation. - Pulmonic valve: There was no regurgitation. - Pulmonary arteries: Systolic pressure was within the normal   range. - Inferior vena cava: The vessel was normal in size. - Pericardium, extracardiac: There was no pericardial effusion.    Procedures   Left Heart Cath and Coronary Angiography 12/2014  Conclusion   1. Normal coronary arteries. 2. Moderately to severely reduced LV systolic function with an ejection fraction of 30-35% with severe hypokinesis of the mid distal anterior, apical and mid to distal inferior walls consistent with stress-induced cardiomyopathy. 3. Mildly elevated left ventricular end-diastolic pressure.  Recommendations: Recommend a small dose beta blocker and ACE inhibitor. Avoid stress. Monitor for at least another day and possible  discharge home tomorrow if she remains stable.     Assessment/Plan:  1. Chest pain: no CAD cath 2016    Normal cardiac CT with calcium score 0 05/15/17 non cardiac pain   2. HTN - improved continue current meds   3. Takotsubo DCM  - EF 64% recovered by MRI 03/18/15 . She has no symptoms of CHF. Would keep on her current  regimen for now.    4. Rash:  Has a primary and dermatologist likely contact dermatitis from something dog was in contact with Told her to use benadryl and not husbands Gabapentin    F/U in a year   Jenkins Rouge

## 2018-02-21 ENCOUNTER — Encounter: Payer: Self-pay | Admitting: Cardiovascular Disease

## 2018-02-21 ENCOUNTER — Ambulatory Visit (INDEPENDENT_AMBULATORY_CARE_PROVIDER_SITE_OTHER): Payer: BLUE CROSS/BLUE SHIELD | Admitting: Cardiovascular Disease

## 2018-02-21 VITALS — BP 134/74 | HR 72 | Ht 64.0 in | Wt 199.5 lb

## 2018-02-21 DIAGNOSIS — R079 Chest pain, unspecified: Secondary | ICD-10-CM | POA: Diagnosis not present

## 2018-02-21 DIAGNOSIS — I5181 Takotsubo syndrome: Secondary | ICD-10-CM | POA: Diagnosis not present

## 2018-02-21 DIAGNOSIS — I10 Essential (primary) hypertension: Secondary | ICD-10-CM | POA: Diagnosis not present

## 2018-02-21 NOTE — Patient Instructions (Addendum)

## 2018-03-18 ENCOUNTER — Ambulatory Visit (INDEPENDENT_AMBULATORY_CARE_PROVIDER_SITE_OTHER): Payer: BLUE CROSS/BLUE SHIELD

## 2018-03-18 DIAGNOSIS — Z23 Encounter for immunization: Secondary | ICD-10-CM

## 2018-03-28 ENCOUNTER — Other Ambulatory Visit: Payer: Self-pay | Admitting: Cardiovascular Disease

## 2018-07-05 ENCOUNTER — Telehealth: Payer: Self-pay | Admitting: Cardiovascular Disease

## 2018-07-05 ENCOUNTER — Other Ambulatory Visit: Payer: Self-pay | Admitting: Nurse Practitioner

## 2018-07-05 NOTE — Telephone Encounter (Signed)
New message    *STAT* If patient is at the pharmacy, call can be transferred to refill team.   1. Which medications need to be refilled? (please list name of each medication and dose if known) nitroglycerin   2. Which pharmacy/location (including street and city if local pharmacy) is medication to be sent to?[place], [place]  3. Do they need a 30 day or 90 day supply? [place]

## 2018-07-08 ENCOUNTER — Ambulatory Visit: Payer: BLUE CROSS/BLUE SHIELD | Admitting: Family Medicine

## 2018-07-08 ENCOUNTER — Encounter: Payer: Self-pay | Admitting: Family Medicine

## 2018-07-08 VITALS — BP 121/73 | HR 73 | Temp 97.2°F | Ht 64.0 in | Wt 203.5 lb

## 2018-07-08 DIAGNOSIS — R110 Nausea: Secondary | ICD-10-CM

## 2018-07-08 DIAGNOSIS — H9209 Otalgia, unspecified ear: Secondary | ICD-10-CM | POA: Diagnosis not present

## 2018-07-08 DIAGNOSIS — I255 Ischemic cardiomyopathy: Secondary | ICD-10-CM | POA: Diagnosis not present

## 2018-07-08 MED ORDER — BETAMETHASONE SOD PHOS & ACET 6 (3-3) MG/ML IJ SUSP
6.0000 mg | Freq: Once | INTRAMUSCULAR | Status: AC
  Administered 2018-07-08: 6 mg via INTRAMUSCULAR

## 2018-07-08 MED ORDER — AMOXICILLIN-POT CLAVULANATE 875-125 MG PO TABS: 1.0000 | ORAL_TABLET | Freq: Two times a day (BID) | ORAL | 0 refills | Status: DC

## 2018-07-08 NOTE — Progress Notes (Signed)
Chief Complaint  Patient presents with  . Ear Pain    pt here today c/o ear pain and sore throat    HPI  Patient presents today for  Three weeks of earache. Some congestion as well. Has tube on right. Has felt it draining. [place] in stomach daily for 2 weks. Developed precordial pressure, severe two days ago. Ruled out at [facility] hospital with overnight stay including troponin levels. Pt. Plans to see her cardiologist soon. No further chest pain since the event.   PMH: Smoking status noted ROS: Per HPI  Objective: BP 121/73   Pulse 73   Temp (!) 97.2 F (36.2 C) (Oral)   Ht '5\' 4"'$  (1.626 m)   Wt 203 lb 8 oz (92.3 kg)   LMP 05/18/2011   BMI 34.93 kg/m  Gen: NAD, alert, cooperative with exam HEENT: NCAT, EOMI, PERRL TMs have effusion. PE tube present on right CV: RRR, good S1/S2, no murmur Resp: CTABL, no wheezes, non-labored Ext: No edema, warm Neuro: Alert and oriented, No gross deficits  Assessment and plan:  1. Otalgia, unspecified laterality   2. Nausea in adult   3. Ischemic cardiomyopathy     Meds ordered this encounter  Medications  . betamethasone acetate-betamethasone sodium phosphate (CELESTONE) injection 6 mg  . amoxicillin-clavulanate (AUGMENTIN) 875-125 MG tablet    Sig: Take 1 tablet by mouth 2 (two) times daily. Take all of this medication    Dispense:  20 tablet    Refill:  0    Orders Placed This Encounter  Procedures  . CBC with Differential/Platelet  . CMP14+EGFR    Follow up as needed.  Claretta Fraise, MD

## 2018-07-09 ENCOUNTER — Telehealth: Payer: Self-pay | Admitting: Cardiovascular Disease

## 2018-07-09 LAB — CMP14+EGFR
ALT: 21 IU/L (ref 0–32)
AST: 22 IU/L (ref 0–40)
Albumin/Globulin Ratio: 2.4 — ABNORMAL HIGH (ref 1.2–2.2)
Albumin: 4.5 g/dL (ref 3.8–4.9)
Alkaline Phosphatase: 55 IU/L (ref 39–117)
BUN/Creatinine Ratio: 21 (ref 9–23)
BUN: 17 mg/dL (ref 6–24)
Bilirubin Total: <0.2 mg/dL (ref 0.0–1.2)
CALCIUM: 9.5 mg/dL (ref 8.7–10.2)
CO2: 24 mmol/L (ref 20–29)
CREATININE: 0.81 mg/dL (ref 0.57–1.00)
Chloride: 106 mmol/L (ref 96–106)
GFR, EST AFRICAN AMERICAN: 92 mL/min/{1.73_m2} (ref >59)
GFR, EST NON AFRICAN AMERICAN: 80 mL/min/{1.73_m2} (ref >59)
GLUCOSE: 103 mg/dL — AB (ref 65–99)
Globulin, Total: 1.9 g/dL (ref 1.5–4.5)
Potassium: 4.9 mmol/L (ref 3.5–5.2)
Sodium: 146 mmol/L — ABNORMAL HIGH (ref 134–144)
Total Protein: 6.4 g/dL (ref 6.0–8.5)

## 2018-07-09 LAB — CBC WITH DIFFERENTIAL/PLATELET
BASOS ABS: 0 10*3/uL (ref 0.0–0.2)
BASOS: 0 %
EOS (ABSOLUTE): 0.1 10*3/uL (ref 0.0–0.4)
Eos: 2 %
HEMOGLOBIN: 13.1 g/dL (ref 11.1–15.9)
Hematocrit: 39.7 % (ref 34.0–46.6)
IMMATURE GRANULOCYTES: 0 %
Immature Grans (Abs): 0 10*3/uL (ref 0.0–0.1)
LYMPHS: 21 %
Lymphocytes Absolute: 0.9 10*3/uL (ref 0.7–3.1)
MCH: 31.4 pg (ref 26.6–33.0)
MCHC: 33 g/dL (ref 31.5–35.7)
MCV: 95 fL (ref 79–97)
MONOCYTES: 8 %
Monocytes Absolute: 0.4 10*3/uL (ref 0.1–0.9)
NEUTROS PCT: 69 %
Neutrophils Absolute: 3 10*3/uL (ref 1.4–7.0)
PLATELETS: 184 10*3/uL (ref 150–450)
RBC: 4.17 x10E6/uL (ref 3.77–5.28)
RDW: 12.7 % (ref 11.7–15.4)
WBC: 4.4 10*3/uL (ref 3.4–10.8)

## 2018-07-09 NOTE — Telephone Encounter (Signed)
Medical records received from [facility]. 07/09/18 vlm

## 2018-07-09 NOTE — Progress Notes (Signed)
CARDIOLOGY OFFICE NOTE  Date:  07/15/2018    [name] Date of Birth: [birth year] Medical Record #[id number]  PCP:  Dettinger, Fransisca Kaufmann, MD  Cardiologist:  Johnsie Cancel   No chief complaint on file.   History of Present Illness: [name] is a 58 y.o. female who presents for f/u Takatsubo DCM, Abnormal ECG, labile BP and chest pain   She has a history of chronically abnormal EKG. Remote normal cath in [facility] many years ago. Admitted in 2016 with chest pain - found to have Tkatsubo with EF 45% - no CAD by cath - MRI in 02/2015 showed normal EF. Hospital visit in 06/2015 for chest pain with negative evaluation.   Cardiac CT done 05/15/17 normal with calcium score 0  Seen by primary with Otalgia on 07/08/18  Started on Augmentin and Celestone   Seen in [place] for [facility] 07/06/18 R/O normal CXR thought to have gas Reviewed All records Troponin negative x 2   She is very depressed and crying She is on antibiotics for ear infection and bronchitis She " knows something is wrong and we can't find it" I offered to look into her heart Again with stress test or cath and she declined She said she couldn't afford it I also Told her that her anxiety and depression could lead to a recurrence of her Takatsubo DCM.    Past Medical History:  Diagnosis Date  . Anxiety   . Arthritis    back (12/30/2014)  . Childhood asthma   . Daily headache   . Depression   . GERD (gastroesophageal reflux disease)   . History of palpitations    STRESS INDUCED  . Hypercholesterolemia dx'd 12/2014  . Hypertension   . Kidney cysts    "I think it was right"  . Kidney stones   . Left ureteral calculus   . Malignant melanoma of skin of eyebrow ([place]) 2001    RIGHT SUPRAORBITAL (RIGHT FOREHEAD AND UPPER EYELID ----  S/P MOHS PROCDURE W/ SLN BX----   NO RECURRENCE  . 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 NSTEMI (non-ST elevated myocardial infarction) ([place]) 12/28/2014  . PONV (postoperative nausea and vomiting)   . Takotsubo syndrome    "Broken heart syndrome"    Past Surgical History:  Procedure Laterality Date  . BACK SURGERY    . BREAST BIOPSY Bilateral early 2000's  . CARDIAC CATHETERIZATION  2006 (APPROX)  [place]   NORMAL  . CARDIAC CATHETERIZATION  12/30/2014  . CARDIAC CATHETERIZATION N/A 12/30/2014   Procedure: Left Heart Cath and Coronary Angiography;  Surgeon: Wellington Hampshire, MD;  Location: [place] [facility];  Service: Cardiovascular;  Laterality: N/A;  . CARDIOVASCULAR STRESS TEST  03-21-2012  DR Johnsie Cancel   NORMAL NUCLEAR STUDY/  NO ISCHEMIA/  EF 63%  . CYSTOSCOPY W/ URETERAL STENT PLACEMENT Left 03/26/2013   Procedure: CYSTOSCOPY WITH RETROGRADE PYELOGRAM ;  Surgeon: Molli Hazard, MD;  Location: [facility];  Service: Urology;  Laterality: Left;  . CYSTOSCOPY WITH RETROGRADE PYELOGRAM, URETEROSCOPY AND STENT PLACEMENT Bilateral 03/19/2013   Procedure: CYSTOSCOPY WITH RETROGRADE PYELOGRAM, BILATERAL URETEROSCOPY AND STENT PLACEMENT LEFT URETER,BILATERAL STONE EXTRACTION , HOLMIUM LASER LEFT URETER;  Surgeon: Molli Hazard, MD;  [facility];  Service: Urology;  Laterality: Bilateral;  . CYSTOSCOPY WITH STENT PLACEMENT Left  03/26/2013   Procedure: CYSTOSCOPY WITH STENT PLACEMENT;  Surgeon: Molli Hazard, MD;  Location: [facility];  Service: Urology;  Laterality: Left;  . DIAGNOSTIC LAPAROSCOPY  04-12-2009  . ESOPHAGOGASTRODUODENOSCOPY (EGD) WITH PROPOFOL N/A 02/10/2016   Procedure: ESOPHAGOGASTRODUODENOSCOPY (EGD) WITH PROPOFOL;  Surgeon: Ronald Lobo, MD;  [facility];  Service: Endoscopy;  Laterality: N/A;  . KIDNEY SURGERY  1966   BILATERAL URETER'S DILATATION  . LUMBAR LAMINECTOMY/DECOMPRESSION MICRODISCECTOMY  05/18/2011   Procedure: LUMBAR LAMINECTOMY/DECOMPRESSION  MICRODISCECTOMY;  Surgeon: Johnn Hai;  [facility];  Service: Orthopedics;  Laterality: Right;  Decompression Lumbar 4-Lumbar 5  Right    (xray)   . MELANOMA EXCISION WITH SENTINEL LYMPH NODE BIOPSY  2001   moh's procedure/  RIGHT FOREHEAD AND UPPER EYEBROW  . RIGHT LATERAL PAROTIDECTOMY W/ NERVE DISSECTION / RIGHT MODIFIED RADICAL NECK DISSECTION SPARING SCM ELEVENTH NERVE AND INTERNAL JUGULAR VEIN  09-12-2006  DR DWIGHT BATES   DR DWIGHT BATES; "inside gland; lots of lymph nodes"     Medications: Current Meds  Medication Sig  . albuterol (PROVENTIL HFA;VENTOLIN HFA) 108 (90 Base) MCG/ACT inhaler Inhale 2 puffs into the lungs every 6 (six) hours as needed for wheezing or shortness of breath.  Marland Kitchen amoxicillin-clavulanate (AUGMENTIN) 875-125 MG tablet Take 1 tablet by mouth 2 (two) times daily. Take all of this medication  . aspirin 81 MG chewable tablet Chew 81 mg by mouth daily.  Marland Kitchen aspirin-acetaminophen-caffeine (EXCEDRIN MIGRAINE) 250-250-65 MG tablet Take 2 tablets by mouth every 6 (six) hours as needed for headache.  Marland Kitchen atorvastatin (LIPITOR) 20 MG tablet TAKE 1 TABLET BY MOUTH ONCE DAILY  . carvedilol (COREG) 3.125 MG tablet TAKE ONE TABLET BY MOUTH TWICE DAILY WITH A MEAL  . CIPRODEX OTIC suspension   . docusate sodium (COLACE) 100 MG capsule Take 100 mg by mouth as directed.  Marland Kitchen ketotifen (THERA TEARS ALLERGY) 0.025 % ophthalmic solution Apply 1 drop to eye daily as needed (for dry eye relief).  Marland Kitchen lisinopril (PRINIVIL,ZESTRIL) 2.5 MG tablet TAKE 1 TABLET BY MOUTH ONCE DAILY  . Multiple Vitamin (MULTIVITAMIN) tablet Take 1 tablet by mouth every evening.   . nitroGLYCERIN (NITROSTAT) 0.4 MG SL tablet DISSOLVE ONE TABLET UNDER THE TONGUE EVERY 5 MINUTES AS NEEDED FOR CHEST PAIN.  DO NOT EXCEED A TOTAL OF 3 DOSES IN 15 MINUTES  . pantoprazole (PROTONIX) 40 MG tablet TAKE 1 TABLET BY MOUTH TWICE DAILY FOR 30 DAYS     Allergies: Allergies  Allergen Reactions  . Ativan  [Lorazepam] Shortness Of Breath and Other (See Comments)    She may be very sensitive to benzo.   . Cephalexin Shortness Of Breath and Rash  . Cephalexin Rash and Shortness Of Breath    Social History: The patient  reports that she has never smoked. She has never used smokeless tobacco. She reports current alcohol use. She reports that she does not use drugs.   Family History: The patient's family history includes COPD in her mother; Cancer in her father and mother; Dementia in her mother; Heart disease in her father; Hyperlipidemia in her father; Hypertension in her father and mother.   Review of Systems: Please see the history of present illness.   Otherwise, the review of systems is positive for none.   All other systems are reviewed and negative.   Physical Exam: VS:  BP 122/72   Pulse 83   Ht 5\' 4"  (1.626 m)   Wt 202 lb (91.6 kg)  LMP 05/18/2011   BMI 34.67 kg/m  .  BMI Body mass index is 34.67 kg/m.  Wt Readings from Last 3 Encounters:  07/15/18 202 lb (91.6 kg)  07/08/18 203 lb 8 oz (92.3 kg)  02/21/18 199 lb 8 oz (90.5 kg)   Affect appropriate Healthy:  appears stated age 24: normal Neck supple with no adenopathy JVP normal no bruits no thyromegaly Lungs clear with no wheezing and good diaphragmatic motion Heart:  S1/S2 no murmur, no rub, gallop or click PMI normal Abdomen: benighn, BS positve, no tenderness, no AAA no bruit.  No HSM or HJR Distal pulses intact with no bruits No edema Neuro non-focal Skin warm and dry No muscular weakness    LABORATORY DATA:  EKG:  03/28/17  NSR with anterior T wave changes chronic .  [facility] Results  Component Value Date   WBC 4.4 07/08/2018   HGB 13.1 07/08/2018   HCT 39.7 07/08/2018   PLT 184 07/08/2018   GLUCOSE 103 (H) 07/08/2018   CHOL 146 01/03/2018   TRIG 101 01/03/2018   HDL 50 01/03/2018   LDLCALC 76 01/03/2018   ALT 21 07/08/2018   AST 22 07/08/2018   NA 146 (H) 07/08/2018   K 4.9 07/08/2018   CL  106 07/08/2018   CREATININE 0.81 07/08/2018   BUN 17 07/08/2018   CO2 24 07/08/2018   TSH 2.360 07/05/2017   INR 1.10 12/30/2014   HGBA1C 5.5 01/03/2017     BNP (last 3 results) No results for input(s): BNP in the last 8760 hours.  ProBNP (last 3 results) No results for input(s): PROBNP in the last 8760 hours.   Other Studies Reviewed Today:  Cardiac MRI IMPRESSION 02/2015: 1) Normal LV size and function  2) Quantitative EF 64% no RWMA;s  3) No scar tissue or hyperenhancement  Overall findings consistent with Takatsubo DCM  Electronically Signed   By: Jenkins Rouge M.D.   On: 03/18/2015 16:52   Echo Study Conclusions from 12/2014  - Left ventricle: The cavity size was normal. Systolic function was   mildly reduced. The estimated ejection fraction was 45%. Diffuse   hypokinesis. Doppler parameters are consistent with abnormal left   ventricular relaxation (grade 1 diastolic dysfunction). There was   no evidence of elevated ventricular filling pressure by Doppler   parameters. - Aortic valve: Trileaflet; normal thickness leaflets. There was   trivial regurgitation. - Aortic root: The aortic root was normal in size. - Mitral valve: Structurally normal valve. There was no   regurgitation. - Left atrium: The atrium was normal in size. - Right ventricle: Systolic function was normal. - Right atrium: The atrium was normal in size. - Tricuspid valve: There was trivial regurgitation. - Pulmonic valve: There was no regurgitation. - Pulmonary arteries: Systolic pressure was within the normal   range. - Inferior vena cava: The vessel was normal in size. - Pericardium, extracardiac: There was no pericardial effusion.    Procedures   Left Heart Cath and Coronary Angiography 12/2014  Conclusion   1. Normal coronary arteries. 2. Moderately to severely reduced LV systolic function with an ejection fraction of 30-35% with severe hypokinesis of the mid distal anterior,  apical and mid to distal inferior walls consistent with stress-induced cardiomyopathy. 3. Mildly elevated left ventricular end-diastolic pressure.  Recommendations: Recommend a small dose beta blocker and ACE inhibitor. Avoid stress. Monitor for at least another day and possible discharge home tomorrow if she remains stable.     Assessment/Plan:  1.  Chest pain: no CAD cath 2016    Normal cardiac CT with calcium score 0 05/15/17 non cardiac pain Observe Given recent ER visit offered patient stress testing and she declined    2. HTN - improved continue current meds   3. Takotsubo DCM  - EF 64% recovered by MRI 03/18/15 . She has no symptoms of CHF. Would keep on her current regimen for now.  At risk for recurrence given current anxiety and depression     F/U in 3 months    Jenkins Rouge

## 2018-07-09 NOTE — Progress Notes (Signed)
Hello Amelie,  Your lab result is normal.Some minor variations that are not significant are commonly marked abnormal, but do not represent any medical problem for you.  Best regards, Claretta Fraise, M.D.

## 2018-07-09 NOTE — Telephone Encounter (Signed)
Medical records requested from [facility] 07/09/18. Pt has appt 07/15/18 @ 08:30a. vlm

## 2018-07-10 ENCOUNTER — Telehealth: Payer: Self-pay | Admitting: Cardiovascular Disease

## 2018-07-10 NOTE — Telephone Encounter (Signed)
Created in error

## 2018-07-15 ENCOUNTER — Ambulatory Visit: Payer: BLUE CROSS/BLUE SHIELD | Admitting: Cardiovascular Disease

## 2018-07-15 VITALS — BP 122/72 | HR 83 | Ht 64.0 in | Wt 202.0 lb

## 2018-07-15 DIAGNOSIS — R079 Chest pain, unspecified: Secondary | ICD-10-CM

## 2018-07-15 DIAGNOSIS — I10 Essential (primary) hypertension: Secondary | ICD-10-CM

## 2018-07-15 DIAGNOSIS — I5181 Takotsubo syndrome: Secondary | ICD-10-CM | POA: Diagnosis not present

## 2018-07-15 NOTE — Patient Instructions (Addendum)
Medication Instructions:   If you need a refill on your cardiac medications before your next appointment, please call your pharmacy.   Lab work:  If you have labs (blood work) drawn today and your tests are completely normal, you will receive your results only by: . MyChart Message (if you have MyChart) OR . A paper copy in the mail If you have any lab test that is abnormal or we need to change your treatment, we will call you to review the results.  Testing/Procedures: None ordered today.  Follow-Up: At [facility], you and your health needs are our priority.  As part of our continuing mission to provide you with exceptional heart care, we have created designated Provider Care Teams.  These Care Teams include your primary Cardiologist (physician) and Advanced Practice Providers (APPs -  Physician Assistants and Nurse Practitioners) who all work together to provide you with the care you need, when you need it. You will need a follow up appointment in 3 months.    You may see Dr. Nishan or one of the following Advanced Practice Providers on your designated Care Team:   Lori Gerhardt, NP Laura Ingold, NP . Jill McDaniel, NP   

## 2018-07-17 ENCOUNTER — Ambulatory Visit: Payer: BLUE CROSS/BLUE SHIELD | Admitting: Family Medicine

## 2018-07-17 ENCOUNTER — Encounter: Payer: Self-pay | Admitting: Family Medicine

## 2018-07-17 VITALS — BP 123/80 | HR 68 | Temp 97.2°F | Ht 64.0 in | Wt 201.8 lb

## 2018-07-17 DIAGNOSIS — K224 Dyskinesia of esophagus: Secondary | ICD-10-CM | POA: Diagnosis not present

## 2018-07-17 NOTE — Progress Notes (Signed)
BP 123/80   Pulse 68   Temp (!) 97.2 F (36.2 C) (Oral)   Ht '5\' 4"'$  (1.626 m)   Wt 201 lb 12.8 oz (91.5 kg)   LMP 05/18/2011   BMI 34.64 kg/m    Subjective:    Patient ID: [name], female    DOB: [birth year], 58 y.o.   MRN: [id number]  HPI: [name] is a 58 y.o. female presenting on 07/17/2018 for Hospitalization Follow-up (Patient seen Dr. Livia Snellen 1/20 for hospital follow up. Patient states she went to [facility] ER since she was having chest pain.  Has seen Cardiologist since then and states her heart is fine.)   HPI Follow-up from hospital and chest pain Patient was in [place] ER for having chest pain prior to seeing my colleague on 07/08/2018.  She came in to see him for an ER/chest pain follow-up and is coming in to see me just to reestablish with me as her primary care physician.  She says she is still having a lot of issues with her right ear but she is seeing an ENT and he is trying to figure out what is going on with there.  She still has eustachian tubes in place.  She was evaluated in the ER and also had a follow-up with her cardiologist which said that her heart looked great and she did not have any cardiac abnormalities and that they thought it was more related to gastric or esophageal problems.  She said when she had the issue it came on all of a sudden as a tightness and squeezing and pressure in the center of her chest that lasted until she took a nitro and then the nitro helped resolve it.  She has not had the chest pain since leaving the hospital.  Relevant past medical, surgical, family and social history reviewed and updated as indicated. Interim medical history since our last visit reviewed. Allergies and medications reviewed and updated.  Review of Systems  Constitutional: Negative for chills and fever.  HENT: Positive for ear discharge and ear pain.   Eyes: Negative for redness and visual disturbance.  Respiratory: Negative for chest tightness  and shortness of breath.   Cardiovascular: Negative for chest pain and leg swelling.  Musculoskeletal: Negative for back pain and gait problem.  Skin: Negative for rash.  Neurological: Negative for light-headedness and headaches.  Psychiatric/Behavioral: Negative for agitation and behavioral problems.  All other systems reviewed and are negative.   Per HPI unless specifically indicated above   Allergies as of 07/17/2018      Reactions   Ativan [lorazepam] Shortness Of Breath, Other (See Comments)   She may be very sensitive to benzo.    Cephalexin Shortness Of Breath, Rash   Cephalexin Rash, Shortness Of Breath      Medication List       Accurate as of July 17, 2018 11:59 PM. Always use your most recent med list.        albuterol 108 (90 Base) MCG/ACT inhaler Commonly known as:  PROVENTIL HFA;VENTOLIN HFA Inhale 2 puffs into the lungs every 6 (six) hours as needed for wheezing or shortness of breath.   amoxicillin-clavulanate 875-125 MG tablet Commonly known as:  AUGMENTIN Take 1 tablet by mouth 2 (two) times daily. Take all of this medication   aspirin 81 MG chewable tablet Chew 81 mg by mouth daily.   aspirin-acetaminophen-caffeine 250-250-65 MG tablet Commonly known as:  EXCEDRIN MIGRAINE Take 2 tablets by mouth every 6 (  six) hours as needed for headache.   atorvastatin 20 MG tablet Commonly known as:  LIPITOR TAKE 1 TABLET BY MOUTH ONCE DAILY   carvedilol 3.125 MG tablet Commonly known as:  COREG TAKE ONE TABLET BY MOUTH TWICE DAILY WITH A MEAL   CIPRODEX OTIC suspension Generic drug:  ciprofloxacin-dexamethasone   docusate sodium 100 MG capsule Commonly known as:  COLACE Take 100 mg by mouth as directed.   lisinopril 2.5 MG tablet Commonly known as:  PRINIVIL,ZESTRIL TAKE 1 TABLET BY MOUTH ONCE DAILY   multivitamin tablet Take 1 tablet by mouth every evening.   nitroGLYCERIN 0.4 MG SL tablet Commonly known as:  NITROSTAT DISSOLVE ONE TABLET  UNDER THE TONGUE EVERY 5 MINUTES AS NEEDED FOR CHEST PAIN.  DO NOT EXCEED A TOTAL OF 3 DOSES IN 15 MINUTES   pantoprazole 40 MG tablet Commonly known as:  PROTONIX TAKE 1 TABLET BY MOUTH TWICE DAILY FOR 30 DAYS   THERATEARS ALLERGY 0.025 % ophthalmic solution Generic drug:  ketotifen Apply 1 drop to eye daily as needed (for dry eye relief).          Objective:    BP 123/80   Pulse 68   Temp (!) 97.2 F (36.2 C) (Oral)   Ht '5\' 4"'$  (1.626 m)   Wt 201 lb 12.8 oz (91.5 kg)   LMP 05/18/2011   BMI 34.64 kg/m   Wt Readings from Last 3 Encounters:  07/17/18 201 lb 12.8 oz (91.5 kg)  07/15/18 202 lb (91.6 kg)  07/08/18 203 lb 8 oz (92.3 kg)    Physical Exam Vitals signs and nursing note reviewed.  Constitutional:      General: She is not in acute distress.    Appearance: She is well-developed. She is not diaphoretic.  Eyes:     Conjunctiva/sclera: Conjunctivae normal.  Cardiovascular:     Rate and Rhythm: Normal rate and regular rhythm.     Heart sounds: Normal heart sounds. No murmur.  Pulmonary:     Effort: Pulmonary effort is normal. No respiratory distress.     Breath sounds: Normal breath sounds. No wheezing.  Musculoskeletal: Normal range of motion.        General: No tenderness.  Skin:    General: Skin is warm and dry.     Findings: No rash.  Neurological:     Mental Status: She is alert and oriented to person, place, and time.     Coordination: Coordination normal.  Psychiatric:        Behavior: Behavior normal.     Results for orders placed or performed in visit on 07/08/18  CBC with Differential/Platelet  Result Value Ref Range   WBC 4.4 3.4 - 10.8 x10E3/uL   RBC 4.17 3.77 - 5.28 x10E6/uL   Hemoglobin 13.1 11.1 - 15.9 g/dL   Hematocrit 39.7 34.0 - 46.6 %   MCV 95 79 - 97 fL   MCH 31.4 26.6 - 33.0 pg   MCHC 33.0 31.5 - 35.7 g/dL   RDW 12.7 11.7 - 15.4 %   Platelets 184 150 - 450 x10E3/uL   Neutrophils 69 Not Estab. %   Lymphs 21 Not Estab. %    Monocytes 8 Not Estab. %   Eos 2 Not Estab. %   Basos 0 Not Estab. %   Neutrophils Absolute 3.0 1.4 - 7.0 x10E3/uL   Lymphocytes Absolute 0.9 0.7 - 3.1 x10E3/uL   Monocytes Absolute 0.4 0.1 - 0.9 x10E3/uL   EOS (ABSOLUTE) 0.1 0.0 -  0.4 x10E3/uL   Basophils Absolute 0.0 0.0 - 0.2 x10E3/uL   Immature Granulocytes 0 Not Estab. %   Immature Grans (Abs) 0.0 0.0 - 0.1 x10E3/uL  CMP14+EGFR  Result Value Ref Range   Glucose 103 (H) 65 - 99 mg/dL   BUN 17 6 - 24 mg/dL   Creatinine, Ser 0.81 0.57 - 1.00 mg/dL   GFR calc non Af Amer 80 >59 mL/min/1.73   GFR calc Af Amer 92 >59 mL/min/1.73   BUN/Creatinine Ratio 21 9 - 23   Sodium 146 (H) 134 - 144 mmol/L   Potassium 4.9 3.5 - 5.2 mmol/L   Chloride 106 96 - 106 mmol/L   CO2 24 20 - 29 mmol/L   Calcium 9.5 8.7 - 10.2 mg/dL   Total Protein 6.4 6.0 - 8.5 g/dL   Albumin 4.5 3.8 - 4.9 g/dL   Globulin, Total 1.9 1.5 - 4.5 g/dL   Albumin/Globulin Ratio 2.4 (H) 1.2 - 2.2   Bilirubin Total <0.2 0.0 - 1.2 mg/dL   Alkaline Phosphatase 55 39 - 117 IU/L   AST 22 0 - 40 IU/L   ALT 21 0 - 32 IU/L      Assessment & Plan:   Problem List Items Addressed This Visit    None    Visit Diagnoses    Esophageal spasm    -  Primary    Gave education on what esophageal spasm is and if she continues to have it and then we may consider muscle relaxer in the future.  Follow up plan: Return if symptoms worsen or fail to improve.  Counseling provided for all of the vaccine components No orders of the defined types were placed in this encounter.   Caryl Pina, MD [facility] 07/22/2018, 10:07 PM

## 2018-07-17 NOTE — Patient Instructions (Signed)
Esophageal Spasm  An esophageal spasm is a sudden tightening (contraction) of the part of the body that moves food from the mouth to the stomach (esophagus). Normally, smooth, wave-like muscle contractions move food and liquids down the esophagus. Esophageal spasms are abnormal muscle contractions that can cause chest pain and trouble swallowing (dysphagia). Spasms may also cause swallowed foods or liquids to come back up into the throat (regurgitation). There are two types of esophageal spasms. You may have one or both types:  Diffuse esophageal spasms. These are irregular, uncoordinated spasms. This type tends to cause more dysphagia.  Nutcracker esophagus. This is a type of spasm in which the muscles move normally, but the contraction is very strong. This type tends to be more painful. Severe esophageal spasms can make it hard to eat and do everyday activities. They often occur with severe heartburn (reflux esophagitis). The symptoms can come and go and may be triggered or worsened depending on your diet or other medical issues. What are the causes? The cause of esophageal spasms is not known. What increases the risk? The following factors may make you more likely to develop esophageal spasms:  Being female.  Age. The risk may increase as you get older.  Depression or anxiety.  Having GERD (gastroesophageal reflux disease). What are the signs or symptoms? Symptoms may vary from day to day. They may be mild or severe. They may last for minutes or hours. Common symptoms include:  Chest pain. This may feel like a heart attack.  Back pain.  Dysphagia.  Heartburn.  A feeling that something is stuck in the throat (globus).  Regurgitation of foods or liquids. For some people, certain things may trigger symptoms, such as:  Certain foods and drinks. These may include very hot or very cold foods or drinks.  Eating very quickly. How is this diagnosed? This condition may be diagnosed  based on your symptoms and a physical exam. You may have tests, such as:  Endoscopy. This involves using a flexible tube that has a camera on the end of it (endoscope) to look down your throat and examine your esophagus.  Barium swallow. This involves drinking a substance that will show up well on X-rays (barium) and then having X-rays to see how the substance moves through your esophagus.  Esophageal manometry. This involves passing a small, thin tube through your nose and down into your throat. The tube contains pressure sensors that measure muscle contractions in the esophagus while you swallow. How is this treated? Mild esophageal spasms may not need treatment. You may be able to manage the spasms by avoiding triggers. For more frequent or severe spasms, treatment may include:  Medicine to: ? Relax the esophageal muscles. ? Relieve muscle spasms (calcium channel blockers and nitrates). ? Relieve pain by blocking nerve endings in the esophagus. This is done with an injection of a toxin (botulinum). ? Relieve heartburn (proton pump inhibitors).  Antidepressant medicines. These are sometimes used to ease symptoms.  Surgery to reduce esophageal muscle contractions (myotomy), in very severe cases. Follow these instructions at home: Eating and drinking  Keep track of foods, drinks, and habits that trigger spasms or heartburn. Avoid these triggers as much as you can.  Eat meals slowly. Chew food completely before swallowing.  Avoid swallowing foods and drinks when they are very hot or very cold. General instructions  Take over-the-counter and prescription medicines only as told by your health care provider.  Find ways to manage stress, such as regular exercise  completely before swallowing.   Avoid swallowing foods and drinks when they are very hot or very cold.  General instructions   Take over-the-counter and prescription medicines only as told by your health care provider.   Find ways to manage stress, such as regular exercise or meditation.   If you struggle with depression or anxiety, talk with your health care provider about treatment options.   Keep all follow-up visits as told by your health care provider. This is important.  Contact a health care  provider if:   Your symptoms get worse or do not get better with medicine.   You are losing weight because of dysphagia.   Your esophageal spasms affect your quality of life, such as your ability to eat.  Get help right away if:   You have severe chest pain.   You have chest pain that is different from your usual chest pain.   You have trouble breathing.   You choke.  Summary   An esophageal spasm is a sudden tightening (contraction) of the part of the body that moves food from the mouth to the stomach (esophagus). These abnormal muscle contractions can cause chest pain and trouble swallowing (dysphagia).   The cause of esophageal spasms is not known.   Treatment may not be needed for mild spasms. For frequent or more severe spasms, treatment may include medicine, or, for very severe spasms, surgery.   Keep track of foods, drinks, and habits that trigger spasms or heartburn. Avoid these triggers as much as you can.  This information is not intended to replace advice given to you by your health care provider. Make sure you discuss any questions you have with your health care provider.  Document Released: 08/26/2002 Document Revised: 03/06/2017 Document Reviewed: 03/06/2017  [employer]vier Interactive Patient Education  2019 [employer]

## 2018-10-07 ENCOUNTER — Emergency Department (HOSPITAL_COMMUNITY)
Admission: EM | Admit: 2018-10-07 | Discharge: 2018-10-07 | Disposition: A | Discharge status: Home / Self Care | Payer: BLUE CROSS/BLUE SHIELD | Attending: Emergency Medicine | Admitting: Emergency Medicine

## 2018-10-07 ENCOUNTER — Ambulatory Visit: Payer: BLUE CROSS/BLUE SHIELD | Admitting: Family Medicine

## 2018-10-07 ENCOUNTER — Emergency Department (HOSPITAL_COMMUNITY): Payer: BLUE CROSS/BLUE SHIELD

## 2018-10-07 ENCOUNTER — Encounter (HOSPITAL_COMMUNITY): Payer: Self-pay | Admitting: Emergency Medicine

## 2018-10-07 ENCOUNTER — Ambulatory Visit: Payer: BLUE CROSS/BLUE SHIELD | Admitting: *Deleted

## 2018-10-07 ENCOUNTER — Telehealth: Payer: Self-pay | Admitting: Family Medicine

## 2018-10-07 ENCOUNTER — Other Ambulatory Visit: Payer: Self-pay

## 2018-10-07 DIAGNOSIS — Z85828 Personal history of other malignant neoplasm of skin: Secondary | ICD-10-CM | POA: Insufficient documentation

## 2018-10-07 DIAGNOSIS — I10 Essential (primary) hypertension: Secondary | ICD-10-CM | POA: Insufficient documentation

## 2018-10-07 DIAGNOSIS — Z79899 Other long term (current) drug therapy: Secondary | ICD-10-CM | POA: Insufficient documentation

## 2018-10-07 DIAGNOSIS — R079 Chest pain, unspecified: Secondary | ICD-10-CM | POA: Diagnosis present

## 2018-10-07 DIAGNOSIS — Z85818 Personal history of malignant neoplasm of other sites of lip, oral cavity, and pharynx: Secondary | ICD-10-CM | POA: Diagnosis not present

## 2018-10-07 DIAGNOSIS — Z7982 Long term (current) use of aspirin: Secondary | ICD-10-CM | POA: Insufficient documentation

## 2018-10-07 DIAGNOSIS — J45909 Unspecified asthma, uncomplicated: Secondary | ICD-10-CM | POA: Insufficient documentation

## 2018-10-07 LAB — COMPREHENSIVE METABOLIC PANEL WITH GFR
ALT: 28 U/L (ref 0–44)
AST: 27 U/L (ref 15–41)
Albumin: 4.4 g/dL (ref 3.5–5.0)
Alkaline Phosphatase: 51 U/L (ref 38–126)
Anion gap: 9 (ref 5–15)
BUN: 12 mg/dL (ref 6–20)
CO2: 26 mmol/L (ref 22–32)
Calcium: 9.1 mg/dL (ref 8.9–10.3)
Chloride: 106 mmol/L (ref 98–111)
Creatinine, Ser: 0.81 mg/dL (ref 0.44–1.00)
GFR calc Af Amer: >60 mL/min
GFR calc non Af Amer: >60 mL/min
Glucose, Bld: 98 mg/dL (ref 70–99)
Potassium: 3.6 mmol/L (ref 3.5–5.1)
Sodium: 141 mmol/L (ref 135–145)
Total Bilirubin: 0.2 mg/dL — ABNORMAL LOW (ref 0.3–1.2)
Total Protein: 7.1 g/dL (ref 6.5–8.1)

## 2018-10-07 LAB — CBC WITH DIFFERENTIAL/PLATELET
Abs Immature Granulocytes: 0.01 10*3/uL (ref 0.00–0.07)
Basophils Absolute: 0 10*3/uL (ref 0.0–0.1)
Basophils Relative: 0 %
Eosinophils Absolute: 0.1 10*3/uL (ref 0.0–0.5)
Eosinophils Relative: 2 %
HCT: 39.4 % (ref 36.0–46.0)
Hemoglobin: 13.1 g/dL (ref 12.0–15.0)
Immature Granulocytes: 0 %
Lymphocytes Relative: 29 %
Lymphs Abs: 1.7 10*3/uL (ref 0.7–4.0)
MCH: 31.6 pg (ref 26.0–34.0)
MCHC: 33.2 g/dL (ref 30.0–36.0)
MCV: 94.9 fL (ref 80.0–100.0)
Monocytes Absolute: 0.5 10*3/uL (ref 0.1–1.0)
Monocytes Relative: 9 %
Neutro Abs: 3.5 10*3/uL (ref 1.7–7.7)
Neutrophils Relative %: 60 %
Platelets: 159 10*3/uL (ref 150–400)
RBC: 4.15 MIL/uL (ref 3.87–5.11)
RDW: 12.3 % (ref 11.5–15.5)
WBC: 5.8 10*3/uL (ref 4.0–10.5)
nRBC: 0 % (ref 0.0–0.2)

## 2018-10-07 LAB — TROPONIN I
Troponin I: <0.03 ng/mL (ref <0.03)
Troponin I: <0.03 ng/mL (ref <0.03)

## 2018-10-07 NOTE — Progress Notes (Signed)
Pt came into office requesting Troponin level and EKG due to chest tightness  Pt informed we are unable to do Stat Troponin level in office Spoke with Dr Dettinger regarding pt's symptoms Per Dr D, pt is to go to [facility] ED Pt informed of recommendation Verbalizes understanding

## 2018-10-07 NOTE — Discharge Instructions (Addendum)
Your vital signs remained stable.  Your oxygen level is 98% on room air.  Within normal limits by my interpretation.  Your electrocardiogram is negative for an acute event.  2 sets of cardiac enzymes were negative for acute event.  Electrolytes were also within normal limits.  Please schedule an appointment with Dr. Warrick Parisian concerning your chest discomfort.  Please return to the emergency department if any worsening of your symptoms, changes in your condition, problems, or concerns.

## 2018-10-07 NOTE — Telephone Encounter (Signed)
Yes we can go ahead and do the note for her

## 2018-10-07 NOTE — Telephone Encounter (Signed)
Please review and advise.

## 2018-10-07 NOTE — ED Provider Notes (Signed)
[facility] EMERGENCY DEPARTMENT Provider Note   CSN: [id number] Arrival date & time: 10/07/18  1437    History   Chief Complaint Chief Complaint  Patient presents with   Chest Pain    HPI [name] is a 58 y.o. female.     The history is provided by the patient. No language interpreter was used.  Chest Pain  Pain location:  Epigastric Pain quality: aching and burning   Pain radiates to:  Does not radiate Chronicity:  New Relieved by:  Nothing Worsened by:  Nothing Ineffective treatments:  None tried Associated symptoms: no abdominal pain   Pt has a history of Takotosubo syndrome.  Pt reports her last episode happened when she became upset.  Pt reports she can not breath when she wears a face mask.  Pt reports her manager at work made her wear one and she became angry. Pt reports she developed chest pain.  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   MYOEPITHIOMA  OF PAROTID SALVERY GLAND --  X35  RADIATION TX  COMPLETED AUGUST 2008--   NO RECURRENCE   NSTEMI (non-ST elevated myocardial infarction) ([place]) 12/28/2014   PONV (postoperative nausea and vomiting)    Takotsubo syndrome    "Broken heart syndrome"    Patient Active Problem List   Diagnosis Date Noted   Obesity (BMI 30.0-34.9) 01/03/2018   Aortic atherosclerosis ([place]) 02/04/2016   Hyperlipidemia  12/26/2015   Essential (primary) hypertension 12/26/2015   Cardiomyopathy ([place])    ICM- EF 30-35% at cath 12/30/14-improved to 45% by echo 01/06/15 01/06/2015   Orthostatic hypotension 01/06/2015   IBS (irritable bowel syndrome) 01/05/2015   Biliary dyskinesia 01/05/2015   Recent NSTEMI (Taktosubo event 12/30/14) 12/29/2014   GERD (gastroesophageal reflux disease) 03/21/2012    Past Surgical History:  Procedure Laterality Date   BACK SURGERY     BREAST BIOPSY Bilateral early 2000's   CARDIAC CATHETERIZATION  2006 (APPROX)  [place]   NORMAL   CARDIAC CATHETERIZATION  12/30/2014   CARDIAC CATHETERIZATION N/A 12/30/2014   Procedure: Left Heart Cath and Coronary Angiography;  Surgeon: Wellington Hampshire, MD;  Location: [place] [facility];  Service: Cardiovascular;  Laterality: N/A;   CARDIOVASCULAR STRESS TEST  03-21-2012  DR Johnsie Cancel   NORMAL NUCLEAR STUDY/  NO ISCHEMIA/  EF 63%   CYSTOSCOPY W/ URETERAL STENT PLACEMENT Left 03/26/2013   Procedure: CYSTOSCOPY WITH RETROGRADE PYELOGRAM ;  Surgeon: Molli Hazard, MD;  Location: [facility];  Service: Urology;  Laterality: Left;   CYSTOSCOPY WITH RETROGRADE PYELOGRAM, URETEROSCOPY AND STENT PLACEMENT Bilateral 03/19/2013   Procedure: CYSTOSCOPY WITH RETROGRADE PYELOGRAM, BILATERAL URETEROSCOPY AND STENT PLACEMENT LEFT URETER,BILATERAL STONE EXTRACTION , HOLMIUM LASER LEFT URETER;  Surgeon: Dennard Schaumann  Jasmine December, MD;  Location: [facility];  Service: Urology;  Laterality: Bilateral;   CYSTOSCOPY WITH STENT PLACEMENT Left 03/26/2013   Procedure: CYSTOSCOPY WITH STENT PLACEMENT;  Surgeon: Molli Hazard, MD;  Location: [facility];  Service: Urology;  Laterality: Left;   DIAGNOSTIC LAPAROSCOPY  04-12-2009   ESOPHAGOGASTRODUODENOSCOPY (EGD) WITH PROPOFOL N/A 02/10/2016   Procedure: ESOPHAGOGASTRODUODENOSCOPY (EGD) WITH PROPOFOL;  Surgeon: Ronald Lobo, MD;  Location: [facility];  Service:  Endoscopy;  Laterality: N/A;   KIDNEY SURGERY  1966   BILATERAL URETER'S DILATATION   LUMBAR LAMINECTOMY/DECOMPRESSION MICRODISCECTOMY  05/18/2011   Procedure: LUMBAR LAMINECTOMY/DECOMPRESSION MICRODISCECTOMY;  Surgeon: Johnn Hai;  Location: [facility];  Service: Orthopedics;  Laterality: Right;  Decompression Lumbar 4-Lumbar 5  Right    (xray)    MELANOMA EXCISION WITH SENTINEL LYMPH NODE BIOPSY  2001   moh's procedure/  RIGHT FOREHEAD AND UPPER EYEBROW   RIGHT LATERAL PAROTIDECTOMY W/ NERVE DISSECTION / RIGHT MODIFIED RADICAL NECK DISSECTION SPARING SCM ELEVENTH NERVE AND INTERNAL JUGULAR VEIN  09-12-2006  DR DWIGHT BATES   DR DWIGHT BATES; "inside gland; lots of lymph nodes"     OB History   No obstetric history on file.      Home Medications    Prior to Admission medications   Medication Sig Start Date End Date Taking? Authorizing Provider  albuterol (PROVENTIL HFA;VENTOLIN HFA) 108 (90 Base) MCG/ACT inhaler Inhale 2 puffs into the lungs every 6 (six) hours as needed for wheezing or shortness of breath. 10/29/17   Dettinger, Fransisca Kaufmann, MD  amoxicillin-clavulanate (AUGMENTIN) 875-125 MG tablet Take 1 tablet by mouth 2 (two) times daily. Take all of this medication 07/08/18   Claretta Fraise, MD  aspirin 81 MG chewable tablet Chew 81 mg by mouth daily.    [provider]  aspirin-acetaminophen-caffeine (EXCEDRIN MIGRAINE) [phone] MG tablet Take 2 tablets by mouth every 6 (six) hours as needed for headache.    [provider]  atorvastatin (LIPITOR) 20 MG tablet TAKE 1 TABLET BY MOUTH ONCE DAILY 03/28/18   Josue Hector, MD  carvedilol (COREG) 3.125 MG tablet TAKE ONE TABLET BY MOUTH TWICE DAILY WITH A MEAL 03/28/18   Josue Hector, MD  CIPRODEX OTIC suspension  06/25/18   [provider]  docusate sodium (COLACE) 100 MG capsule Take 100 mg by mouth as directed.    [provider]  ketotifen (THERA TEARS ALLERGY) 0.025 % ophthalmic solution  Apply 1 drop to eye daily as needed (for dry eye relief).    [provider]  lisinopril (PRINIVIL,ZESTRIL) 2.5 MG tablet TAKE 1 TABLET BY MOUTH ONCE DAILY 03/28/18   Josue Hector, MD  Multiple Vitamin (MULTIVITAMIN) tablet Take 1 tablet by mouth every evening.     [provider]  nitroGLYCERIN (NITROSTAT) 0.4 MG SL tablet DISSOLVE ONE TABLET UNDER THE TONGUE EVERY 5 MINUTES AS NEEDED FOR CHEST PAIN.  DO NOT EXCEED A TOTAL OF 3 DOSES IN 15 MINUTES 07/05/18   Burtis Junes, NP  pantoprazole (PROTONIX) 40 MG tablet TAKE 1 TABLET BY MOUTH TWICE DAILY FOR 30 DAYS 06/18/18   [provider]    Family History Family History  Problem Relation Age of Onset   Hypertension Mother    COPD Mother    Cancer Mother        breast   Dementia Mother    Heart disease Father    Cancer Father        Colorectal   Hyperlipidemia  Father    Hypertension Father     Social History Social History   Tobacco Use   Smoking status: Never Smoker   Smokeless tobacco: Never Used  Substance Use Topics   Alcohol use: Yes    Comment: 12/30/2014 "might have a beer or glass of wine maybe once/month"   Drug use: No     Allergies   Ativan [lorazepam]; Cephalexin; and Cephalexin   Review of Systems Review of Systems  Cardiovascular: Positive for chest pain.  Gastrointestinal: Negative for abdominal pain.  All other systems reviewed and are negative.    Physical Exam Updated Vital Signs BP 126/71    Pulse 80    Temp (!) 97.5 F (36.4 C) (Oral)    Resp (!) 23    LMP 05/18/2011    SpO2 97%   Physical Exam Vitals signs and nursing note reviewed.  HENT:     Head: Normocephalic.  Eyes:     Pupils: Pupils are equal, round, and reactive to light.  Cardiovascular:     Rate and Rhythm: Normal rate. Rhythm irregular.     Heart sounds: Normal heart sounds. No murmur.  Pulmonary:     Effort: Pulmonary effort is normal.  Chest:     Chest wall: No mass or deformity.   Abdominal:     General: Bowel sounds are normal.  Musculoskeletal: Normal range of motion.  Skin:    General: Skin is warm.  Neurological:     General: No focal deficit present.     Mental Status: She is alert.  Psychiatric:        Mood and Affect: Mood normal.      ED Treatments / Results  Labs (all labs ordered are listed, but only abnormal results are displayed) Labs Reviewed  CBC WITH DIFFERENTIAL/PLATELET  COMPREHENSIVE METABOLIC PANEL  TROPONIN I  TROPONIN I    EKG EKG Interpretation  Date/Time:  Monday October 07 2018 14:54:16 EDT Ventricular Rate:  79 PR Interval:    QRS Duration: 101 QT Interval:  394 QTC Calculation: 452 R Axis:   -46 Text Interpretation:  Sinus rhythm LAD, consider left anterior fascicular block Abnormal R-wave progression, late transition Nonspecific T abnormalities, anterior leads Confirmed by Virgel Manifold [phone]) on 10/07/2018 3:08:08 PM   Radiology No results found.  Procedures Procedures (including critical care time)  Medications Ordered in ED Medications - No data to display   Initial Impression / Assessment and Plan / ED Course  I have reviewed the triage vital signs and the nursing notes.  Pertinent labs & imaging results that were available during my care of the patient were reviewed by me and considered in my medical decision making (see chart for details).        MDM Pt had normal coronary arteries on Cardiac cath 12/2014.  Pt saw Dr. Johnsie Cancel 06/2018. Pt declined cardiac cath.  Troponin ordered x 2.  Pt's care turned over to [facility] with labs pending.   Final Clinical Impressions(s) / ED Diagnoses   Final diagnoses:  None    ED Discharge Orders    None       Sidney Ace 10/07/18 1618    Long, Wonda Olds, MD 10/07/18 [phone]

## 2018-10-07 NOTE — ED Provider Notes (Signed)
END OF SHIFT HAND OFF FROM Brandon Melnick, PA-C   Patient is a 58 year old female who presents to the emergency department with a complaint of chest pain.  There was no loss of consciousness.  No unusual shortness of breath, no change in color, no sweats reported.  Ms. Sophia reviewed the initial troponin, complete blood count, comprehensive metabolic panel and chest x-ray with the patient.  Second troponin is pending.  Second troponin is negative for acute event at less than 0.03.  Patient appears comfortable.  She is reading a magazine, and in no distress.  No changes on the cardiac monitor noted.  I have asked the patient to follow-up with her primary physician, and attempted to reassure her that her test today were all well within normal limits.  The patient has been asked to return immediately if any worsening of her symptoms, changes in her condition, problems, or concerns.  Questions were answered.  Patient is in agreement with this plan.   Lily Kocher, PA-C 10/07/18 Duanne Limerick, MD 10/07/18 2236

## 2018-10-07 NOTE — Telephone Encounter (Signed)
Letter written. Spoke with husband and he stated that she would like the note faxed to the [facility] in [place] and he would have her call back with the fax number.

## 2018-10-07 NOTE — ED Triage Notes (Signed)
Pt C/O chest "burning" that began around 11 this AM. Pt states while at work she was upset because "they made me wear my mask at work and I cannot breathe wearing that mask. So that really pissed me off." Pt states "stress caused my first heart attack."

## 2018-10-09 ENCOUNTER — Other Ambulatory Visit: Payer: Self-pay

## 2018-10-09 ENCOUNTER — Encounter: Payer: Self-pay | Admitting: Family Medicine

## 2018-10-09 ENCOUNTER — Ambulatory Visit (INDEPENDENT_AMBULATORY_CARE_PROVIDER_SITE_OTHER): Payer: BLUE CROSS/BLUE SHIELD | Admitting: Family Medicine

## 2018-10-09 DIAGNOSIS — K224 Dyskinesia of esophagus: Secondary | ICD-10-CM

## 2018-10-09 MED ORDER — PANTOPRAZOLE SODIUM 40 MG PO TBEC: 40.0000 mg | DELAYED_RELEASE_TABLET | Freq: Every day | ORAL | 3 refills | Status: DC

## 2018-10-09 MED ORDER — CYCLOBENZAPRINE HCL 10 MG PO TABS: 10.0000 mg | ORAL_TABLET | Freq: Three times a day (TID) | ORAL | 1 refills | Status: DC | PRN

## 2018-10-09 NOTE — Progress Notes (Signed)
Virtual Visit via telephone Note  I connected with [name] on 10/09/18 at 0808 by telephone and verified that I am speaking with the correct person using two identifiers. [name] is currently located at home and no other people are currently with her during visit. The provider, Fransisca Kaufmann Gwendolin Briel, MD is located in their office at time of visit.  Call ended at [place]  I discussed the limitations, risks, security and privacy concerns of performing an evaluation and management service by telephone and the availability of in person appointments. I also discussed with the patient that there may be a patient responsible charge related to this service. The patient expressed understanding and agreed to proceed.   History and Present Illness: Patient started 2 days ago having chest pains and is still having them.  Every time she eats the food gets stuck.  She take nitro to help and it does help some.  She was seen in the emergency department 2 days ago for this and had 2 troponins and EKGs which came back normal and ruled out anything cardiac, they suspected that she is having esophageal spasms which she has had previously.  Patient says her food gets stuck when it goes down and that she just got really anxious with work because she cannot work right now because she cannot wear a mask at work and they will not let her come to work without a mask.  She currently works for [employer].  She is asking for a letter stating that she cannot wear a mask because she gets short of breath and we have done that for but we will send it to where she wants [place] to fax it to for her workplace now.  No diagnosis found.  Outpatient Encounter Medications as of 10/09/2018  Medication Sig  . albuterol (PROVENTIL HFA;VENTOLIN HFA) 108 (90 Base) MCG/ACT inhaler Inhale 2 puffs into the lungs every 6 (six) hours as needed for wheezing or shortness of breath.  Marland Kitchen amoxicillin-clavulanate (AUGMENTIN) 875-125 MG tablet Take 1  tablet by mouth 2 (two) times daily. Take all of this medication (Patient not taking: Reported on 10/07/2018)  . APPLE CIDER VINEGAR PO Take 1 tablet by mouth at bedtime.  Marland Kitchen aspirin 81 MG chewable tablet Chew 81 mg by mouth daily.  Marland Kitchen aspirin-acetaminophen-caffeine (EXCEDRIN MIGRAINE) 250-250-65 MG tablet Take 2 tablets by mouth every 6 (six) hours as needed for headache.  Marland Kitchen atorvastatin (LIPITOR) 20 MG tablet TAKE 1 TABLET BY MOUTH ONCE DAILY (Patient taking differently: Take 20 mg by mouth every morning. )  . carvedilol (COREG) 3.125 MG tablet TAKE ONE TABLET BY MOUTH TWICE DAILY WITH A MEAL (Patient taking differently: Take 3.125 mg by mouth 2 (two) times daily with a meal. )  . CIPRODEX OTIC suspension Place 4 drops into both ears daily as needed (FOR EAR PAIN).   Marland Kitchen lisinopril (PRINIVIL,ZESTRIL) 2.5 MG tablet TAKE 1 TABLET BY MOUTH ONCE DAILY (Patient taking differently: Take 2.5 mg by mouth every morning. )  . Multiple Vitamin (MULTIVITAMIN) tablet Take 1 tablet by mouth every evening.   . nitroGLYCERIN (NITROSTAT) 0.4 MG SL tablet DISSOLVE ONE TABLET UNDER THE TONGUE EVERY 5 MINUTES AS NEEDED FOR CHEST PAIN.  DO NOT EXCEED A TOTAL OF 3 DOSES IN 15 MINUTES (Patient taking differently: Place 0.4 mg under the tongue every 5 (five) minutes as needed for chest pain. DISSOLVE ONE TABLET UNDER THE TONGUE EVERY 5 MINUTES AS NEEDED FOR CHEST PAIN.  DO NOT  EXCEED A TOTAL OF 3 DOSES IN 15 MINUTES)   No facility-administered encounter medications on file as of 10/09/2018.     Review of Systems  Constitutional: Negative for chills and fever.  Eyes: Negative for redness and visual disturbance.  Respiratory: Positive for chest tightness. Negative for shortness of breath.   Cardiovascular: Negative for chest pain and leg swelling.  Musculoskeletal: Negative for back pain and gait problem.  Skin: Negative for rash.  Neurological: Negative for light-headedness and headaches.  Psychiatric/Behavioral: Negative  for agitation and behavioral problems.  All other systems reviewed and are negative.   Observations/Objective: Patient sounds comfortable and in no acute distress  Assessment and Plan: Problem List Items Addressed This Visit    None    Visit Diagnoses    Esophageal spasm    -  Primary   Relevant Medications   pantoprazole (PROTONIX) 40 MG tablet   cyclobenzaprine (FLEXERIL) 10 MG tablet       Follow Up Instructions:  Follow up as needed   I discussed the assessment and treatment plan with the patient. The patient was provided an opportunity to ask questions and all were answered. The patient agreed with the plan and demonstrated an understanding of the instructions.   The patient was advised to call back or seek an in-person evaluation if the symptoms worsen or if the condition fails to improve as anticipated.  The above assessment and management plan was discussed with the patient. The patient verbalized understanding of and has agreed to the management plan. Patient is aware to call the clinic if symptoms persist or worsen. Patient is aware when to return to the clinic for a follow-up visit. Patient educated on when it is appropriate to go to the emergency department.    I provided 10 minutes of non-face-to-face time during this encounter.    Worthy Rancher, MD

## 2018-10-11 ENCOUNTER — Ambulatory Visit: Payer: BLUE CROSS/BLUE SHIELD | Admitting: Cardiovascular Disease

## 2018-10-13 ENCOUNTER — Emergency Department (HOSPITAL_COMMUNITY)
Admission: EM | Admit: 2018-10-13 | Discharge: 2018-10-13 | Disposition: A | Discharge status: Home / Self Care | Payer: BLUE CROSS/BLUE SHIELD | Attending: Emergency Medicine | Admitting: Emergency Medicine

## 2018-10-13 ENCOUNTER — Encounter (HOSPITAL_COMMUNITY): Payer: Self-pay | Admitting: Emergency Medicine

## 2018-10-13 ENCOUNTER — Other Ambulatory Visit: Payer: Self-pay

## 2018-10-13 ENCOUNTER — Emergency Department (HOSPITAL_COMMUNITY): Payer: BLUE CROSS/BLUE SHIELD

## 2018-10-13 DIAGNOSIS — S60222A Contusion of left hand, initial encounter: Secondary | ICD-10-CM

## 2018-10-13 DIAGNOSIS — Z7982 Long term (current) use of aspirin: Secondary | ICD-10-CM | POA: Diagnosis not present

## 2018-10-13 DIAGNOSIS — Y929 Unspecified place or not applicable: Secondary | ICD-10-CM | POA: Diagnosis not present

## 2018-10-13 DIAGNOSIS — W2209XA Striking against other stationary object, initial encounter: Secondary | ICD-10-CM | POA: Insufficient documentation

## 2018-10-13 DIAGNOSIS — Y9389 Activity, other specified: Secondary | ICD-10-CM | POA: Diagnosis not present

## 2018-10-13 DIAGNOSIS — I252 Old myocardial infarction: Secondary | ICD-10-CM | POA: Insufficient documentation

## 2018-10-13 DIAGNOSIS — I10 Essential (primary) hypertension: Secondary | ICD-10-CM | POA: Insufficient documentation

## 2018-10-13 DIAGNOSIS — Y999 Unspecified external cause status: Secondary | ICD-10-CM | POA: Insufficient documentation

## 2018-10-13 DIAGNOSIS — Z79899 Other long term (current) drug therapy: Secondary | ICD-10-CM | POA: Insufficient documentation

## 2018-10-13 DIAGNOSIS — S6992XA Unspecified injury of left wrist, hand and finger(s), initial encounter: Secondary | ICD-10-CM | POA: Diagnosis present

## 2018-10-13 NOTE — ED Triage Notes (Signed)
Pt reports moving furniture and falling into wall. Pt c/o LT hand pain and swelling. Large hematoma and edema noted.

## 2018-10-13 NOTE — Discharge Instructions (Addendum)
Xray was negative.   Ice, elevate, ace wrap.

## 2018-10-13 NOTE — ED Provider Notes (Signed)
[facility] EMERGENCY DEPARTMENT Provider Note   CSN: [id number] Arrival date & time: 10/13/18  1507    History   Chief Complaint Chief Complaint  Patient presents with  . Hand Injury    HPI [name] is a 58 y.o. female.     Patient accidentally struck the dorsum of her left hand on a wall while moving furniture just prior to visit.  No other injuries.  Severity of pain is moderate.  Palpation makes pain worse.     Past Medical History:  Diagnosis Date  . Anxiety   . Arthritis    back (12/30/2014)  . Childhood asthma   . Daily headache   . Depression   . GERD (gastroesophageal reflux disease)   . History of palpitations    STRESS INDUCED  . Hypercholesterolemia dx'd 12/2014  . Hypertension   . Kidney cysts    "I think it was right"  . Kidney stones   . Left ureteral calculus   . 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 NSTEMI (non-ST elevated myocardial infarction) ([place]) 12/28/2014  . PONV (postoperative nausea and vomiting)   . Takotsubo syndrome    "Broken heart syndrome"    Patient Active Problem List   Diagnosis Date Noted  . Obesity (BMI 30.0-34.9) 01/03/2018  . Aortic atherosclerosis ([place]) 02/04/2016  . Hyperlipidemia 12/26/2015  . Essential (primary) hypertension 12/26/2015  . Cardiomyopathy ([place])   . ICM- EF 30-35% at cath 12/30/14-improved to 45% by echo 01/06/15 01/06/2015  . Orthostatic hypotension 01/06/2015  . IBS (irritable bowel syndrome) 01/05/2015  . Biliary dyskinesia 01/05/2015  . Recent NSTEMI ([place] event 12/30/14) 12/29/2014  . GERD (gastroesophageal reflux disease) 03/21/2012    Past Surgical History:  Procedure Laterality Date  . BACK SURGERY     . BREAST BIOPSY Bilateral early 2000's  . CARDIAC CATHETERIZATION  2006 (APPROX)  [place]   NORMAL  . CARDIAC CATHETERIZATION  12/30/2014  . CARDIAC CATHETERIZATION N/A 12/30/2014   Procedure: Left Heart Cath and Coronary Angiography;  Surgeon: Wellington Hampshire, MD;  Location: [place] [facility];  Service: Cardiovascular;  Laterality: N/A;  . CARDIOVASCULAR STRESS TEST  03-21-2012  DR Johnsie Cancel   NORMAL NUCLEAR STUDY/  NO ISCHEMIA/  EF 63%  . CYSTOSCOPY W/ URETERAL STENT PLACEMENT Left 03/26/2013   Procedure: CYSTOSCOPY WITH RETROGRADE PYELOGRAM ;  Surgeon: Molli Hazard, MD;  Location: [facility];  Service: Urology;  Laterality: Left;  . CYSTOSCOPY WITH RETROGRADE PYELOGRAM, URETEROSCOPY AND STENT PLACEMENT Bilateral 03/19/2013   Procedure: CYSTOSCOPY WITH RETROGRADE PYELOGRAM, BILATERAL URETEROSCOPY AND STENT PLACEMENT LEFT URETER,BILATERAL STONE EXTRACTION , HOLMIUM LASER LEFT URETER;  Surgeon: Molli Hazard, MD;  Location: [facility];  Service: Urology;  Laterality: Bilateral;  . CYSTOSCOPY WITH STENT PLACEMENT Left 03/26/2013   Procedure: CYSTOSCOPY WITH STENT PLACEMENT;  Surgeon: Molli Hazard, MD;  Location: [facility];  Service: Urology;  Laterality: Left;  . DIAGNOSTIC LAPAROSCOPY  04-12-2009  . ESOPHAGOGASTRODUODENOSCOPY (EGD) WITH PROPOFOL N/A 02/10/2016   Procedure: ESOPHAGOGASTRODUODENOSCOPY (EGD) WITH PROPOFOL;  Surgeon: Ronald Lobo, MD;  Location: [facility];  Service: Endoscopy;  Laterality: N/A;  . KIDNEY SURGERY  1966   BILATERAL URETER'S DILATATION  . LUMBAR LAMINECTOMY/DECOMPRESSION MICRODISCECTOMY  05/18/2011   Procedure: LUMBAR LAMINECTOMY/DECOMPRESSION MICRODISCECTOMY;  Surgeon: Johnn Hai;  Location: [facility];  Service: Orthopedics;  Laterality: Right;  Decompression Lumbar 4-Lumbar 5  Right    (xray)   . MELANOMA EXCISION WITH SENTINEL LYMPH NODE BIOPSY  2001   moh's procedure/  RIGHT FOREHEAD AND UPPER EYEBROW   . RIGHT LATERAL PAROTIDECTOMY W/ NERVE DISSECTION / RIGHT MODIFIED RADICAL NECK DISSECTION SPARING SCM ELEVENTH NERVE AND INTERNAL JUGULAR VEIN  09-12-2006  DR DWIGHT BATES   DR DWIGHT BATES; "inside gland; lots of lymph nodes"     OB History   No obstetric history on file.      Home Medications    Prior to Admission medications   Medication Sig Start Date End Date Taking? Authorizing Provider  albuterol (PROVENTIL HFA;VENTOLIN HFA) 108 (90 Base) MCG/ACT inhaler Inhale 2 puffs into the lungs every 6 (six) hours as needed for wheezing or shortness of breath. 10/29/17   Dettinger, Fransisca Kaufmann, MD  APPLE CIDER VINEGAR PO Take 1 tablet by mouth at bedtime.    [provider]  aspirin 81 MG chewable tablet Chew 81 mg by mouth daily.    [provider]  aspirin-acetaminophen-caffeine (EXCEDRIN MIGRAINE) [phone] MG tablet Take 2 tablets by mouth every 6 (six) hours as needed for headache.    [provider]  atorvastatin (LIPITOR) 20 MG tablet TAKE 1 TABLET BY MOUTH ONCE DAILY Patient taking differently: Take 20 mg by mouth every morning.  03/28/18   Josue Hector, MD  carvedilol (COREG) 3.125 MG tablet TAKE ONE TABLET BY MOUTH TWICE DAILY WITH A MEAL Patient taking differently: Take 3.125 mg by mouth 2 (two) times daily with a meal.  03/28/18   Josue Hector, MD  CIPRODEX OTIC suspension Place 4 drops into both ears daily as needed (FOR EAR PAIN).  06/25/18   [provider]  cyclobenzaprine (FLEXERIL) 10 MG tablet Take 1 tablet (10 mg total) by mouth 3 (three) times daily as needed for muscle spasms. 10/09/18   Dettinger, Fransisca Kaufmann, MD  lisinopril (PRINIVIL,ZESTRIL) 2.5 MG tablet TAKE 1 TABLET BY MOUTH ONCE DAILY Patient taking differently: Take 2.5 mg by mouth every morning.  03/28/18   Josue Hector, MD  Multiple Vitamin (MULTIVITAMIN) tablet Take 1 tablet by mouth every evening.     [provider]  nitroGLYCERIN (NITROSTAT) 0.4 MG SL tablet  DISSOLVE ONE TABLET UNDER THE TONGUE EVERY 5 MINUTES AS NEEDED FOR CHEST PAIN.  DO NOT EXCEED A TOTAL OF 3 DOSES IN 15 MINUTES Patient taking differently: Place 0.4 mg under the tongue every 5 (five) minutes as needed for chest pain. DISSOLVE ONE TABLET UNDER THE TONGUE EVERY 5 MINUTES AS NEEDED FOR CHEST PAIN.  DO NOT EXCEED A TOTAL OF 3 DOSES IN 15 MINUTES 07/05/18   Burtis Junes, NP  pantoprazole (PROTONIX) 40 MG tablet Take 1 tablet (40 mg total) by mouth daily. 10/09/18   Dettinger, Fransisca Kaufmann, MD    Family History Family History  Problem Relation Age of Onset  . Hypertension Mother   . COPD Mother   . Cancer Mother        breast  . Dementia Mother   . Heart disease Father   . Cancer Father        Colorectal  . Hyperlipidemia Father   . Hypertension Father     Social History Social History   Tobacco  Use  . Smoking status: Never Smoker  . Smokeless tobacco: Never Used  Substance Use Topics  . Alcohol use: Yes    Comment: 12/30/2014 "might have a beer or glass of wine maybe once/month"  . Drug use: No     Allergies   Ativan [lorazepam]; Cephalexin; and Cephalexin   Review of Systems Review of Systems  All other systems reviewed and are negative.    Physical Exam Updated Vital Signs BP 136/70   Pulse 83   Temp 98.7 F (37.1 C) (Oral)   Resp 16   Ht 5\' 4"  (1.626 m)   Wt 90.7 kg   LMP 05/18/2011   SpO2 98%   BMI 34.33 kg/m   Physical Exam Vitals signs and nursing note reviewed.  Constitutional:      Appearance: She is well-developed.  HENT:     Head: Normocephalic and atraumatic.  Eyes:     Conjunctiva/sclera: Conjunctivae normal.  Neck:     Musculoskeletal: Neck supple.  Cardiovascular:     Rate and Rhythm: Normal rate.  Pulmonary:     Effort: Pulmonary effort is normal.  Abdominal:     General: Bowel sounds are normal.     Palpations: Abdomen is soft.  Musculoskeletal:     Comments: Left hand:  4 by 4 cm hematoma dorsum of hand  Skin:     General: Skin is warm and dry.  Neurological:     Mental Status: She is alert and oriented to person, place, and time.  Psychiatric:        Behavior: Behavior normal.      ED Treatments / Results  Labs (all labs ordered are listed, but only abnormal results are displayed) Labs Reviewed - No data to display  EKG None  Radiology Dg Hand Complete Left  Result Date: 10/13/2018 CLINICAL DATA:  Fall with pain and swelling EXAM: LEFT HAND - COMPLETE 3+ VIEW COMPARISON:  09/16/2015 FINDINGS: No acute displaced fracture or malalignment. Dorsal soft tissue swelling. No radiopaque foreign body. IMPRESSION: Soft tissue swelling.  No acute osseous abnormality. Electronically Signed   By: Donavan Foil M.D.   On: 10/13/2018 16:14    Procedures Procedures (including critical care time)  Medications Ordered in ED Medications - No data to display   Initial Impression / Assessment and Plan / ED Course  I have reviewed the triage vital signs and the nursing notes.  Pertinent labs & imaging results that were available during my care of the patient were reviewed by me and considered in my medical decision making (see chart for details).        History and physical consistent with traumatic hematoma to left hand.  X-ray negative. RICE  Final Clinical Impressions(s) / ED Diagnoses   Final diagnoses:  Traumatic hematoma of left hand, initial encounter    ED Discharge Orders    None       Nat Christen, MD 10/13/18 1651

## 2018-11-15 ENCOUNTER — Ambulatory Visit: Payer: BLUE CROSS/BLUE SHIELD | Admitting: Family Medicine

## 2018-12-02 ENCOUNTER — Other Ambulatory Visit: Payer: Self-pay | Admitting: Otolaryngology

## 2018-12-02 DIAGNOSIS — H6981 Other specified disorders of Eustachian tube, right ear: Secondary | ICD-10-CM

## 2018-12-02 DIAGNOSIS — H6521 Chronic serous otitis media, right ear: Secondary | ICD-10-CM

## 2018-12-03 ENCOUNTER — Ambulatory Visit (INDEPENDENT_AMBULATORY_CARE_PROVIDER_SITE_OTHER): Payer: BC Managed Care – PPO | Admitting: Family Medicine

## 2018-12-03 ENCOUNTER — Encounter: Payer: Self-pay | Admitting: Family Medicine

## 2018-12-03 DIAGNOSIS — R310 Gross hematuria: Secondary | ICD-10-CM

## 2018-12-03 DIAGNOSIS — N3090 Cystitis, unspecified without hematuria: Secondary | ICD-10-CM

## 2018-12-03 LAB — URINALYSIS
Bilirubin, UA: NEGATIVE
Glucose, UA: NEGATIVE
Ketones, UA: NEGATIVE
Leukocytes,UA: NEGATIVE
Nitrite, UA: NEGATIVE
Protein,UA: NEGATIVE
Specific Gravity, UA: 1.025 (ref 1.005–1.030)
Urobilinogen, Ur: 0.2 mg/dL (ref 0.2–1.0)
pH, UA: 5 (ref 5.0–7.5)

## 2018-12-03 MED ORDER — SULFAMETHOXAZOLE-TRIMETHOPRIM 800-160 MG PO TABS: 1.0000 | ORAL_TABLET | Freq: Two times a day (BID) | ORAL | 0 refills | Status: DC

## 2018-12-03 NOTE — Progress Notes (Signed)
Subjective:    Patient ID: [name], female    DOB: 1960-12-13, 58 y.o.   MRN: [id number]   HPI: [name] is a 58 y.o. female presenting for 3 days of right side back pain and hematuria . Nausea is mild. Pain not quite as bad. No fever. Bleeding with stone not as bad usually as this. Having dysuria, frequency and urgency.   Depression screen [facility] 2/9 07/17/2018 07/08/2018 10/29/2017 08/06/2017 07/05/2017  Decreased Interest 0 0 0 0 0  Down, Depressed, Hopeless 0 0 0 0 0  PHQ - 2 Score 0 0 0 0 0  Altered sleeping - - - - -  Tired, decreased energy - - - - -  Change in appetite - - - - -  Feeling bad or failure about yourself  - - - - -  Trouble concentrating - - - - -  Moving slowly or fidgety/restless - - - - -  Suicidal thoughts - - - - -  PHQ-9 Score - - - - -  Difficult doing work/chores - - - - -  Some recent data might be hidden     Relevant past medical, surgical, family and social history reviewed and updated as indicated.  Interim medical history since our last visit reviewed. Allergies and medications reviewed and updated.  ROS:  Review of Systems  Constitutional: Negative for chills, diaphoresis and fever.  HENT: Negative for congestion.   Eyes: Negative for visual disturbance.  Respiratory: Negative for cough and shortness of breath.   Cardiovascular: Negative for chest pain and palpitations.  Gastrointestinal: Negative for constipation, diarrhea and nausea.  Genitourinary: Positive for dysuria, frequency, hematuria and urgency. Negative for decreased urine volume, flank pain, menstrual problem and pelvic pain.  Musculoskeletal: Negative for arthralgias and joint swelling.  Skin: Negative for rash.  Neurological: Negative for dizziness and numbness.     Social History   Tobacco Use  Smoking Status Never Smoker  Smokeless Tobacco Never Used       Objective:     Wt Readings from Last 3 Encounters:  10/13/18 200 lb (90.7 kg)  07/17/18 201  lb 12.8 oz (91.5 kg)  07/15/18 202 lb (91.6 kg)     Exam deferred. Pt. Harboring due to COVID 19. Phone visit performed.   Assessment & Plan:   1. Cystitis   2. Gross hematuria     Meds ordered this encounter  Medications  . sulfamethoxazole-trimethoprim (BACTRIM DS) 800-160 MG tablet    Sig: Take 1 tablet by mouth 2 (two) times daily.    Dispense:  14 tablet    Refill:  0    Orders Placed This Encounter  Procedures  . Urine Culture  . Urinalysis      Diagnoses and all orders for this visit:  Cystitis -     Urinalysis -     Urine Culture  Gross hematuria -     Urinalysis -     Urine Culture  Other orders -     sulfamethoxazole-trimethoprim (BACTRIM DS) 800-160 MG tablet; Take 1 tablet by mouth 2 (two) times daily.    Virtual Visit via telephone Note  I discussed the limitations, risks, security and privacy concerns of performing an evaluation and management service by telephone and the availability of in person appointments. The patient was identified with two identifiers. Pt.expressed understanding and agreed to proceed. Pt. Is at home. Dr. Livia Snellen is in his office.  Follow Up Instructions:   I discussed  the assessment and treatment plan with the patient. The patient was provided an opportunity to ask questions and all were answered. The patient agreed with the plan and demonstrated an understanding of the instructions.   The patient was advised to call back or seek an in-person evaluation if the symptoms worsen or if the condition fails to improve as anticipated.   Total minutes including chart review and phone contact time: 10   Follow up plan: No follow-ups on file.  Claretta Fraise, MD [place]

## 2018-12-06 LAB — URINE CULTURE

## 2018-12-09 ENCOUNTER — Other Ambulatory Visit: Payer: Self-pay | Admitting: Obstetrics and Gynecology

## 2018-12-09 DIAGNOSIS — Z1231 Encounter for screening mammogram for malignant neoplasm of breast: Secondary | ICD-10-CM

## 2018-12-18 ENCOUNTER — Other Ambulatory Visit: Payer: Self-pay

## 2018-12-18 ENCOUNTER — Ambulatory Visit
Admission: RE | Admit: 2018-12-18 | Discharge: 2018-12-18 | Disposition: A | Discharge status: Home / Self Care | Payer: BC Managed Care – PPO | Source: Ambulatory Visit | Attending: Otolaryngology | Admitting: Otolaryngology

## 2018-12-18 DIAGNOSIS — H6521 Chronic serous otitis media, right ear: Secondary | ICD-10-CM

## 2018-12-18 DIAGNOSIS — H6981 Other specified disorders of Eustachian tube, right ear: Secondary | ICD-10-CM

## 2018-12-24 ENCOUNTER — Telehealth: Payer: Self-pay | Admitting: Family Medicine

## 2018-12-24 NOTE — Telephone Encounter (Signed)
Patient had apt with Dr. Livia Snellen 6/16 and was given bactrim.  States she has finished abx and she is still having dysuria and urinary frequency. Please advise

## 2018-12-24 NOTE — Telephone Encounter (Signed)
She needs actual office visit. WS

## 2018-12-24 NOTE — Telephone Encounter (Signed)
Apt scheduled.  

## 2018-12-27 ENCOUNTER — Encounter: Payer: Self-pay | Admitting: Family

## 2018-12-27 ENCOUNTER — Other Ambulatory Visit: Payer: Self-pay

## 2018-12-27 ENCOUNTER — Ambulatory Visit: Payer: BC Managed Care – PPO | Admitting: Family

## 2018-12-27 VITALS — BP 120/82 | HR 66 | Temp 97.4°F | Ht 64.0 in | Wt 196.6 lb

## 2018-12-27 DIAGNOSIS — N939 Abnormal uterine and vaginal bleeding, unspecified: Secondary | ICD-10-CM

## 2018-12-27 DIAGNOSIS — R300 Dysuria: Secondary | ICD-10-CM | POA: Diagnosis not present

## 2018-12-27 DIAGNOSIS — R319 Hematuria, unspecified: Secondary | ICD-10-CM | POA: Diagnosis not present

## 2018-12-27 DIAGNOSIS — R309 Painful micturition, unspecified: Secondary | ICD-10-CM | POA: Diagnosis not present

## 2018-12-27 DIAGNOSIS — R350 Frequency of micturition: Secondary | ICD-10-CM | POA: Diagnosis not present

## 2018-12-27 LAB — MICROSCOPIC EXAMINATION

## 2018-12-27 LAB — URINALYSIS, COMPLETE
Bilirubin, UA: NEGATIVE
Glucose, UA: NEGATIVE
Ketones, UA: NEGATIVE
Leukocytes,UA: NEGATIVE
Nitrite, UA: NEGATIVE
Protein,UA: NEGATIVE
Specific Gravity, UA: >1.03 — ABNORMAL HIGH (ref 1.005–1.030)
Urobilinogen, Ur: 0.2 mg/dL (ref 0.2–1.0)
pH, UA: 5.5 (ref 5.0–7.5)

## 2018-12-27 MED ORDER — PHENAZOPYRIDINE HCL 100 MG PO TABS: 100.0000 mg | ORAL_TABLET | Freq: Three times a day (TID) | ORAL | 0 refills | Status: DC | PRN

## 2018-12-27 NOTE — Patient Instructions (Signed)
Interstitial Cystitis  Interstitial cystitis is inflammation of the bladder. This may cause pain in the bladder area as well as a frequent and urgent need to urinate. The bladder is a hollow organ in the lower part of the abdomen. It stores urine after the urine is made in the kidneys. The severity of interstitial cystitis can vary from person to person. You may have flare-ups, and then your symptoms may go away for a while. For many people, it becomes a long-term (chronic) problem. What are the causes? The cause of this condition is not known. What increases the risk? The following factors may make you more likely to develop this condition:  You are female.  You have fibromyalgia.  You have irritable bowel syndrome (IBS).  You have endometriosis. This condition may be aggravated by:  Stress.  Smoking.  Spicy foods. What are the signs or symptoms? Symptoms of interstitial cystitis vary, and they can change over time. Symptoms may include:  Discomfort or pain in the bladder area, which is in the lower abdomen. Pain can range from mild to severe. The pain may change in intensity as the bladder fills with urine or as it empties.  Pain in the pelvic area, between the hip bones.  An urgent need to urinate.  Frequent urination.  Pain during urination.  Pain during sex.  Blood in the urine. For women, symptoms often get worse during menstruation. How is this diagnosed? This condition is diagnosed based on your symptoms, your medical history, and a physical exam. You may have tests to rule out other conditions, such as:  Urine tests.  Cystoscopy. For this test, a tool similar to a very thin telescope is used to look into your bladder.  Biopsy. This involves taking a sample of tissue from the bladder to be examined under a microscope. How is this treated? There is no cure for this condition, but treatment can help you control your symptoms. Work closely with your health care  provider to find the most effective treatments for you. Treatment options may include:  Medicines to relieve pain and reduce how often you feel the need to urinate.  Learning ways to control when you urinate (bladder training).  Lifestyle changes, such as changing your diet or taking steps to control stress.  Using a device that provides electrical stimulation to your nerves, which can relieve pain (neuromodulation therapy). The device is placed on your back, where it blocks the nerves that cause you to feel pain in your bladder area.  A procedure that stretches your bladder by filling it with air or fluid.  Surgery. This is rare. It is only done for extreme cases, if other treatments do not help. Follow these instructions at home: Bladder training   Use bladder training techniques as directed. Techniques may include: ? Urinating at scheduled times. ? Training yourself to delay urination. ? Doing exercises (Kegel exercises) to strengthen the muscles that control urine flow.  Keep a bladder diary. ? Write down the times that you urinate and any symptoms that you have. This can help you find out which foods, liquids, or activities make your symptoms worse. ? Use your bladder diary to schedule bathroom trips. If you are away from home, plan to be near a bathroom at each of your scheduled times.  Make sure that you urinate just before you leave the house and just before you go to bed. Eating and drinking  Make dietary changes as recommended by your health care provider. You   may need to avoid: ? Spicy foods. ? Foods that contain a lot of potassium.  Limit your intake of beverages that make you need to urinate. These include: ? Caffeinated beverages like soda, coffee, and tea. ? Alcohol. General instructions  Take over-the-counter and prescription medicines only as told by your health care provider.  Do not drink alcohol.  You can try a warm or cool compress over your bladder for  comfort.  Avoid wearing tight clothing.  Do not use any products that contain nicotine or tobacco, such as cigarettes and e-cigarettes. If you need help quitting, ask your health care provider.  Keep all follow-up visits as told by your health care provider. This is important. Contact a health care provider if you have:  Symptoms that do not get better with treatment.  Pain or discomfort that gets worse.  More frequent urges to urinate.  A fever. Get help right away if:  You have no control over when you urinate. Summary  Interstitial cystitis is inflammation of the bladder.  This condition may cause pain in the bladder area as well as a frequent and urgent need to urinate.  You may have flare-ups of the condition, and then it may go away for a while. For many people, it becomes a long-term (chronic) problem.  There is no cure for interstitial cystitis, but treatment methods are available to control your symptoms. This information is not intended to replace advice given to you by your health care provider. Make sure you discuss any questions you have with your health care provider. Document Released: 02/04/2004 Document Revised: 05/18/2017 Document Reviewed: 04/30/2017 Elsevier Patient Education  2020 [employer].

## 2018-12-27 NOTE — Progress Notes (Signed)
Subjective:    Patient ID: [name], female    DOB: June 12, 1961, 58 y.o.   MRN: [id number]  Chief Complaint  Patient presents with  . pain passing urine   Pt presents to the office today with recurrent dysuria. She had a virtual visit on 12/03/18 and was prescribed Bactrim. She left a sample that day and her urine culture was negative. She states her symptoms continue without any improvement.   She does report having light  vaginal bleeding for three days about a month ago. She states this resolved, but then started having dysuria & urinary frequency.  Dysuria  This is a new problem. The current episode started 1 to 4 weeks ago. The problem has been waxing and waning. The quality of the pain is described as burning. The pain is at a severity of 8/10. The pain is mild. Associated symptoms include frequency, hesitancy, nausea and urgency. Pertinent negatives include no discharge, flank pain, hematuria or vomiting. She has tried increased fluids (bactrim) for the symptoms. The treatment provided mild relief. Her past medical history is significant for kidney stones and recurrent UTIs.      Review of Systems  Gastrointestinal: Positive for nausea. Negative for vomiting.  Genitourinary: Positive for dysuria, frequency, hesitancy and urgency. Negative for flank pain and hematuria.  All other systems reviewed and are negative.      Objective:   Physical Exam Vitals signs reviewed.  Constitutional:      General: She is not in acute distress.    Appearance: She is well-developed.  HENT:     Head: Normocephalic and atraumatic.  Eyes:     Pupils: Pupils are equal, round, and reactive to light.  Neck:     Musculoskeletal: Normal range of motion and neck supple.     Thyroid: No thyromegaly.  Cardiovascular:     Rate and Rhythm: Normal rate and regular rhythm.     Heart sounds: Normal heart sounds. No murmur.  Pulmonary:     Effort: Pulmonary effort is normal. No respiratory  distress.     Breath sounds: Normal breath sounds. No wheezing.  Abdominal:     General: Bowel sounds are normal. There is no distension.     Palpations: Abdomen is soft.     Tenderness: There is no abdominal tenderness.  Musculoskeletal: Normal range of motion.        General: No tenderness.  Skin:    General: Skin is warm and dry.  Neurological:     Mental Status: She is alert and oriented to person, place, and time.     Cranial Nerves: No cranial nerve deficit.     Deep Tendon Reflexes: Reflexes are normal and symmetric.  Psychiatric:        Behavior: Behavior normal.        Thought Content: Thought content normal.        Judgment: Judgment normal.      BP 120/82   Pulse 66   Temp (!) 97.4 F (36.3 C) (Oral)   Ht 5\' 4"  (1.626 m)   Wt 196 lb 9.6 oz (89.2 kg)   LMP 05/18/2011   BMI 33.75 kg/m      Assessment & Plan:  [name] comes in today with chief complaint of pain passing urine   Diagnosis and orders addressed:  1. Pain passing urine - Urinalysis, Complete - Ambulatory referral to Urology  2. Dysuria I feel like this is a flare up of IC, will do Urologists  referral  Pyridium as needed for pain Force fluids Avoid spicy foods - phenazopyridine (PYRIDIUM) 100 MG tablet; Take 1 tablet (100 mg total) by mouth 3 (three) times daily as needed for pain.  Dispense: 10 tablet; Refill: 0 - Ambulatory referral to Urology - Urine Culture  3. Urinary frequency - Ambulatory referral to Urology - Urine Culture  4. Hematuria, unspecified type - Ambulatory referral to Urology - Urine Culture  5. Abnormal vaginal bleeding She will schedule pap asap!!! - Ambulatory referral to Urology   Follow up with Urologists Schedule pap asap  Keep follow up with PCP   Evelina Dun, FNP

## 2018-12-29 LAB — URINE CULTURE

## 2019-01-01 ENCOUNTER — Telehealth: Payer: Self-pay | Admitting: Family

## 2019-01-01 NOTE — Telephone Encounter (Signed)
In the meantime I would use over-the-counter Azo which can help with the burning until you see them or possibly even see a urologist if they are not going to manage it.  They commonly well.

## 2019-01-01 NOTE — Telephone Encounter (Signed)
Aware of provider's advice. 

## 2019-01-01 NOTE — Telephone Encounter (Signed)
Patient aware of urine culture results. Patient states that burning with urination is worsening, sees gynecologist Friday for pap and mammo.  Patient would like to know what she can do in the meantime

## 2019-01-15 ENCOUNTER — Ambulatory Visit: Payer: BLUE CROSS/BLUE SHIELD | Admitting: Family Medicine

## 2019-01-24 ENCOUNTER — Ambulatory Visit: Payer: BLUE CROSS/BLUE SHIELD

## 2019-02-17 ENCOUNTER — Other Ambulatory Visit: Payer: Self-pay

## 2019-02-19 ENCOUNTER — Ambulatory Visit (INDEPENDENT_AMBULATORY_CARE_PROVIDER_SITE_OTHER): Payer: BC Managed Care – PPO

## 2019-02-19 ENCOUNTER — Other Ambulatory Visit: Payer: Self-pay

## 2019-02-19 DIAGNOSIS — Z23 Encounter for immunization: Secondary | ICD-10-CM

## 2019-03-26 ENCOUNTER — Other Ambulatory Visit: Payer: Self-pay | Admitting: Cardiovascular Disease

## 2019-04-10 NOTE — Progress Notes (Signed)
CARDIOLOGY OFFICE NOTE  Date:  04/14/2019    [name] Date of Birth: [birth year] Medical Record [id number]  PCP:  Dettinger, Fransisca Kaufmann, MD  Cardiologist:  Johnsie Cancel   No chief complaint on file.   History of Present Illness: [name] is a 58 y.o. female who presents for f/u Takatsubo DCM, Abnormal ECG, labile BP and chest pain   She has a history of chronically abnormal EKG. Remote normal cath in [facility] many years ago. Admitted in 2016 with chest pain - found to have Tkatsubo with EF 45% - no CAD by cath - MRI in 02/2015 showed normal EF. Hospital visit in 06/2015 for chest pain with negative evaluation.   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 Anxiety   . Arthritis    back (12/30/2014)  . Childhood asthma   . Daily headache   . Depression   . GERD (gastroesophageal reflux disease)   . History of palpitations    STRESS INDUCED  . Hypercholesterolemia dx'd 12/2014  . Hypertension   . Kidney cysts    "I think it was right"  . Kidney stones   . Left ureteral calculus   . 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 NSTEMI (non-ST elevated myocardial infarction) ([place]) 12/28/2014  . PONV (postoperative nausea and vomiting)   .  Takotsubo syndrome    "Broken heart syndrome"    Past Surgical History:  Procedure Laterality Date  . BACK SURGERY    . BREAST BIOPSY Bilateral early 2000's  . CARDIAC CATHETERIZATION  2006 (APPROX)  [place]   NORMAL  . CARDIAC CATHETERIZATION  12/30/2014  . CARDIAC CATHETERIZATION N/A 12/30/2014   Procedure: Left Heart Cath and Coronary Angiography;  Surgeon: Wellington Hampshire, MD;  Location: [facility];  Service: Cardiovascular;  Laterality: N/A;  . CARDIOVASCULAR STRESS TEST  03-21-2012  DR Johnsie Cancel   NORMAL NUCLEAR STUDY/  NO ISCHEMIA/  EF 63%  . CYSTOSCOPY W/ URETERAL STENT PLACEMENT Left 03/26/2013   Procedure: CYSTOSCOPY WITH RETROGRADE PYELOGRAM ;  Surgeon: Molli Hazard, MD;  Location: [facility];  Service: Urology;  Laterality: Left;  . CYSTOSCOPY WITH RETROGRADE PYELOGRAM, URETEROSCOPY AND STENT PLACEMENT Bilateral 03/19/2013   Procedure: CYSTOSCOPY WITH RETROGRADE PYELOGRAM, BILATERAL URETEROSCOPY AND STENT PLACEMENT LEFT URETER,BILATERAL STONE EXTRACTION , HOLMIUM LASER LEFT URETER;  Surgeon: Molli Hazard, MD;  Location: [facility];  Service: Urology;  Laterality: Bilateral;  . CYSTOSCOPY WITH STENT PLACEMENT Left 03/26/2013   Procedure: CYSTOSCOPY WITH STENT PLACEMENT;  Surgeon: Molli Hazard, MD;  Location: [facility];  Service: Urology;  Laterality: Left;  . DIAGNOSTIC LAPAROSCOPY  04-12-2009  . ESOPHAGOGASTRODUODENOSCOPY (  EGD) WITH PROPOFOL N/A 02/10/2016   Procedure: ESOPHAGOGASTRODUODENOSCOPY (EGD) WITH PROPOFOL;  Surgeon: Ronald Lobo, MD;  Location: [facility];  Service: Endoscopy;  Laterality: N/A;  . KIDNEY SURGERY  1966   BILATERAL URETER'S DILATATION  . LUMBAR LAMINECTOMY/DECOMPRESSION MICRODISCECTOMY  05/18/2011   Procedure: LUMBAR LAMINECTOMY/DECOMPRESSION MICRODISCECTOMY;  Surgeon: Johnn Hai;  Location: [facility];  Service: Orthopedics;  Laterality: Right;  Decompression Lumbar 4-Lumbar 5  Right     (xray)   . MELANOMA EXCISION WITH SENTINEL LYMPH NODE BIOPSY  2001   moh's procedure/  RIGHT FOREHEAD AND UPPER EYEBROW  . RIGHT LATERAL PAROTIDECTOMY W/ NERVE DISSECTION / RIGHT MODIFIED RADICAL NECK DISSECTION SPARING SCM ELEVENTH NERVE AND INTERNAL JUGULAR VEIN  09-12-2006  DR DWIGHT BATES   DR DWIGHT BATES; "inside gland; lots of lymph nodes"     Medications: Current Meds  Medication Sig  . albuterol (PROVENTIL HFA;VENTOLIN HFA) 108 (90 Base) MCG/ACT inhaler Inhale 2 puffs into the lungs every 6 (six) hours as needed for wheezing or shortness of breath.  Marland Kitchen aspirin 81 MG chewable tablet Chew 81 mg by mouth daily.  Marland Kitchen aspirin-acetaminophen-caffeine (EXCEDRIN MIGRAINE) 250-250-65 MG tablet Take 2 tablets by mouth every 6 (six) hours as needed for headache.  Marland Kitchen atorvastatin (LIPITOR) 20 MG tablet TAKE 1 TABLET BY MOUTH ONCE DAILY (Patient taking differently: Take 20 mg by mouth every morning. )  . carvedilol (COREG) 3.125 MG tablet Take 1 tablet (3.125 mg total) by mouth 2 (two) times daily with a meal.  . CIPRODEX OTIC suspension Place 4 drops into both ears daily as needed (FOR EAR PAIN).   Marland Kitchen lisinopril (ZESTRIL) 2.5 MG tablet Take 1 tablet by mouth once daily  . Multiple Vitamin (MULTIVITAMIN) tablet Take 1 tablet by mouth every evening.   . nitroGLYCERIN (NITROSTAT) 0.4 MG SL tablet DISSOLVE ONE TABLET UNDER THE TONGUE EVERY 5 MINUTES AS NEEDED FOR CHEST PAIN.  DO NOT EXCEED A TOTAL OF 3 DOSES IN 15 MINUTES (Patient taking differently: Place 0.4 mg under the tongue every 5 (five) minutes as needed for chest pain. DISSOLVE ONE TABLET UNDER THE TONGUE EVERY 5 MINUTES AS NEEDED FOR CHEST PAIN.  DO NOT EXCEED A TOTAL OF 3 DOSES IN 15 MINUTES)     Allergies: Allergies  Allergen Reactions  . Ativan [Lorazepam] Shortness Of Breath and Other (See Comments)    She may be very sensitive to benzo.   . Cephalexin Shortness Of Breath and Rash  . Cephalexin Rash and Shortness Of Breath    Social  History: The patient  reports that she has never smoked. She has never used smokeless tobacco. She reports current alcohol use. She reports that she does not use drugs.   Family History: The patient's family history includes COPD in her mother; Cancer in her father and mother; Dementia in her mother; Heart disease in her father; Hyperlipidemia in her father; Hypertension in her father and mother.   Review of Systems: Please see the history of present illness.   Otherwise, the review of systems is positive for none.   All other systems are reviewed and negative.   Physical Exam: VS:  BP 110/68   Pulse 99   Ht 5\' 4"  (1.626 m)   Wt 185 lb (83.9 kg)   LMP 05/18/2011   SpO2 99%   BMI 31.76 kg/m  .  BMI Body mass index is 31.76 kg/m.  Wt Readings from Last 3 Encounters:  04/14/19 185 lb (83.9 kg)  12/27/18 196 lb  9.6 oz (89.2 kg)  10/13/18 200 lb (90.7 kg)   Affect appropriate Healthy:  appears stated age 31: normal Neck supple with no adenopathy JVP normal no bruits no thyromegaly Lungs clear with no wheezing and good diaphragmatic motion Heart:  S1/S2 no murmur, no rub, gallop or click PMI normal Abdomen: benighn, BS positve, no tenderness, no AAA no bruit.  No HSM or HJR Distal pulses intact with no bruits No edema Neuro non-focal Skin warm and dry No muscular weakness    LABORATORY DATA:  EKG:  03/28/17  NSR with anterior T wave changes chronic .  [facility] Results  Component Value Date   WBC 5.8 10/07/2018   HGB 13.1 10/07/2018   HCT 39.4 10/07/2018   PLT 159 10/07/2018   GLUCOSE 98 10/07/2018   CHOL 146 01/03/2018   TRIG 101 01/03/2018   HDL 50 01/03/2018   LDLCALC 76 01/03/2018   ALT 28 10/07/2018   AST 27 10/07/2018   NA 141 10/07/2018   K 3.6 10/07/2018   CL 106 10/07/2018   CREATININE 0.81 10/07/2018   BUN 12 10/07/2018   CO2 26 10/07/2018   TSH 2.360 07/05/2017   INR 1.10 12/30/2014   HGBA1C 5.5 01/03/2017     BNP (last 3 results) No results  for input(s): BNP in the last 8760 hours.  ProBNP (last 3 results) No results for input(s): PROBNP in the last 8760 hours.   Other Studies Reviewed Today:  Cardiac MRI IMPRESSION 02/2015: 1) Normal LV size and function  2) Quantitative EF 64% no RWMA;s  3) No scar tissue or hyperenhancement  Overall findings consistent with Takatsubo DCM  Electronically Signed   By: Jenkins Rouge M.D.   On: 03/18/2015 16:52   Echo Study Conclusions from 12/2014  - Left ventricle: The cavity size was normal. Systolic function was   mildly reduced. The estimated ejection fraction was 45%. Diffuse   hypokinesis. Doppler parameters are consistent with abnormal left   ventricular relaxation (grade 1 diastolic dysfunction). There was   no evidence of elevated ventricular filling pressure by Doppler   parameters. - Aortic valve: Trileaflet; normal thickness leaflets. There was   trivial regurgitation. - Aortic root: The aortic root was normal in size. - Mitral valve: Structurally normal valve. There was no   regurgitation. - Left atrium: The atrium was normal in size. - Right ventricle: Systolic function was normal. - Right atrium: The atrium was normal in size. - Tricuspid valve: There was trivial regurgitation. - Pulmonic valve: There was no regurgitation. - Pulmonary arteries: Systolic pressure was within the normal   range. - Inferior vena cava: The vessel was normal in size. - Pericardium, extracardiac: There was no pericardial effusion.    Procedures   Left Heart Cath and Coronary Angiography 12/2014  Conclusion   1. Normal coronary arteries. 2. Moderately to severely reduced LV systolic function with an ejection fraction of 30-35% with severe hypokinesis of the mid distal anterior, apical and mid to distal inferior walls consistent with stress-induced cardiomyopathy. 3. Mildly elevated left ventricular end-diastolic pressure.  Recommendations: Recommend a small dose beta  blocker and ACE inhibitor. Avoid stress. Monitor for at least another day and possible discharge home tomorrow if she remains stable.     Assessment/Plan:  1. Chest pain: no CAD cath 2016    Normal cardiac CT with calcium score 0 05/15/17 non cardiac pain Observe    2. HTN - improved continue current meds   3. Takotsubo DCM  -  EF 64% recovered by MRI 03/18/15 . She has no symptoms of CHF. Would keep on her current regimen for now.  At risk for recurrence given current anxiety and depression     F/U in a year   Jenkins Rouge

## 2019-04-14 ENCOUNTER — Ambulatory Visit: Payer: BC Managed Care – PPO | Admitting: Cardiovascular Disease

## 2019-04-14 ENCOUNTER — Other Ambulatory Visit: Payer: Self-pay

## 2019-04-14 ENCOUNTER — Encounter: Payer: Self-pay | Admitting: Cardiovascular Disease

## 2019-04-14 VITALS — BP 110/68 | HR 99 | Ht 64.0 in | Wt 185.0 lb

## 2019-04-14 DIAGNOSIS — I5181 Takotsubo syndrome: Secondary | ICD-10-CM

## 2019-04-14 NOTE — Patient Instructions (Signed)

## 2019-05-02 ENCOUNTER — Other Ambulatory Visit: Payer: Self-pay | Admitting: Cardiovascular Disease

## 2019-05-20 ENCOUNTER — Ambulatory Visit: Payer: BC Managed Care – PPO | Admitting: Family Medicine

## 2019-06-05 ENCOUNTER — Telehealth: Payer: Self-pay | Admitting: Family Medicine

## 2019-06-05 DIAGNOSIS — G4452 New daily persistent headache (NDPH): Secondary | ICD-10-CM

## 2019-06-05 NOTE — Telephone Encounter (Signed)
PATENT STATES SHE DOES NOT NEED TO BE SEEN SHE HAS HISTORY YOU KNOW ABOUT

## 2019-06-05 NOTE — Telephone Encounter (Signed)
I put an MRI ordered but insurance may require a visit prior to paying, will go ahead and see.

## 2019-06-05 NOTE — Telephone Encounter (Signed)
Lmtcb.

## 2019-06-08 ENCOUNTER — Other Ambulatory Visit: Payer: Self-pay | Admitting: Cardiovascular Disease

## 2019-06-09 ENCOUNTER — Telehealth: Payer: Self-pay | Admitting: Family Medicine

## 2019-06-10 NOTE — Telephone Encounter (Signed)
Patient called wanting to know if she can see another provider to be referred for MRI since Dr Dettinger doesn't have anything available soon. Would like to be called back.

## 2019-06-10 NOTE — Telephone Encounter (Signed)
Patient called stating that she received a missed call from someone here around [place]. Requested to be called back.

## 2019-06-18 ENCOUNTER — Telehealth: Payer: Self-pay | Admitting: Family Medicine

## 2019-06-18 NOTE — Telephone Encounter (Signed)
Attempted to contact patient- no call back- this encounter will be closed.

## 2019-06-23 ENCOUNTER — Other Ambulatory Visit: Payer: BC Managed Care – PPO

## 2019-06-23 NOTE — Telephone Encounter (Signed)
rc for [employer]

## 2019-06-25 DIAGNOSIS — Z029 Encounter for administrative examinations, unspecified: Secondary | ICD-10-CM

## 2019-07-03 ENCOUNTER — Encounter: Payer: Self-pay | Admitting: Family Medicine

## 2019-07-03 ENCOUNTER — Ambulatory Visit (INDEPENDENT_AMBULATORY_CARE_PROVIDER_SITE_OTHER): Payer: BC Managed Care – PPO | Admitting: Family Medicine

## 2019-07-03 DIAGNOSIS — Z8589 Personal history of malignant neoplasm of other organs and systems: Secondary | ICD-10-CM

## 2019-07-03 DIAGNOSIS — G4452 New daily persistent headache (NDPH): Secondary | ICD-10-CM | POA: Diagnosis not present

## 2019-07-03 NOTE — Progress Notes (Signed)
Virtual Visit via telephone Note  I connected with [name] on 07/03/19 at 0800 by telephone and verified that I am speaking with the correct person using two identifiers. [name] is currently located at home and no other people are currently with her during visit. The provider, Fransisca Kaufmann Parlee Amescua, MD is located in their office at time of visit.  Call ended at 0815  I discussed the limitations, risks, security and privacy concerns of performing an evaluation and management service by telephone and the availability of in person appointments. I also discussed with the patient that there may be a patient responsible charge related to this service. The patient expressed understanding and agreed to proceed.   History and Present Illness: Patient is calling in for headaches. She is having daily headaches, with a constant stabbing headache above right eye.  She take excedrin and caffeine and that helps.  She has a history of cancer and radiation on that right side of her neck.  She denies vision changes.  She says the headaches are worsening and increasing in severity.  With the excedrin they will go away in 30 minutes. Her BP is running good. She does have ear problems on that right side from radiation. It has been like this for 2 months.    Outpatient Encounter Medications as of 07/03/2019  Medication Sig  . albuterol (PROVENTIL HFA;VENTOLIN HFA) 108 (90 Base) MCG/ACT inhaler Inhale 2 puffs into the lungs every 6 (six) hours as needed for wheezing or shortness of breath.  Marland Kitchen aspirin 81 MG chewable tablet Chew 81 mg by mouth daily.  Marland Kitchen aspirin-acetaminophen-caffeine (EXCEDRIN MIGRAINE) 250-250-65 MG tablet Take 2 tablets by mouth every 6 (six) hours as needed for headache.  Marland Kitchen atorvastatin (LIPITOR) 20 MG tablet Take 1 tablet by mouth once daily  . carvedilol (COREG) 3.125 MG tablet TAKE 1 TABLET BY MOUTH TWICE DAILY WITH A MEAL  . CIPRODEX OTIC suspension Place 4 drops into both ears  daily as needed (FOR EAR PAIN).   Marland Kitchen lisinopril (ZESTRIL) 2.5 MG tablet Take 1 tablet by mouth once daily  . Multiple Vitamin (MULTIVITAMIN) tablet Take 1 tablet by mouth every evening.   . nitroGLYCERIN (NITROSTAT) 0.4 MG SL tablet DISSOLVE ONE TABLET UNDER THE TONGUE EVERY 5 MINUTES AS NEEDED FOR CHEST PAIN.  DO NOT EXCEED A TOTAL OF 3 DOSES IN 15 MINUTES   No facility-administered encounter medications on file as of 07/03/2019.    Review of Systems  Constitutional: Negative for chills and fever.  Eyes: Negative for visual disturbance.  Respiratory: Negative for chest tightness and shortness of breath.   Cardiovascular: Negative for chest pain and leg swelling.  Musculoskeletal: Negative for back pain and gait problem.  Skin: Negative for rash.  Neurological: Positive for headaches. Negative for dizziness, weakness, light-headedness and numbness.  Psychiatric/Behavioral: Negative for agitation and behavioral problems.  All other systems reviewed and are negative.   Observations/Objective: Patient sounds comfortable and in no acute distress  Assessment and Plan: Problem List Items Addressed This Visit    None    Visit Diagnoses    New daily persistent headache    -  Primary   Relevant Orders   MR Brain Wo Contrast   History of head and neck cancer          New headaches daily and more severe than they were Follow up plan: Return if symptoms worsen or fail to improve.     I discussed the assessment  and treatment plan with the patient. The patient was provided an opportunity to ask questions and all were answered. The patient agreed with the plan and demonstrated an understanding of the instructions.   The patient was advised to call back or seek an in-person evaluation if the symptoms worsen or if the condition fails to improve as anticipated.  The above assessment and management plan was discussed with the patient. The patient verbalized understanding of and has agreed to  the management plan. Patient is aware to call the clinic if symptoms persist or worsen. Patient is aware when to return to the clinic for a follow-up visit. Patient educated on when it is appropriate to go to the emergency department.    I provided 15 minutes of non-face-to-face time during this encounter.    Worthy Rancher, MD

## 2019-07-12 ENCOUNTER — Other Ambulatory Visit: Payer: Self-pay

## 2019-07-12 ENCOUNTER — Ambulatory Visit
Admission: RE | Admit: 2019-07-12 | Discharge: 2019-07-12 | Disposition: A | Discharge status: Home / Self Care | Payer: BC Managed Care – PPO | Source: Ambulatory Visit | Attending: Family Medicine | Admitting: Family Medicine

## 2019-07-12 DIAGNOSIS — G4452 New daily persistent headache (NDPH): Secondary | ICD-10-CM

## 2019-08-14 DIAGNOSIS — M1711 Unilateral primary osteoarthritis, right knee: Secondary | ICD-10-CM | POA: Insufficient documentation

## 2019-08-19 ENCOUNTER — Other Ambulatory Visit: Payer: Self-pay | Admitting: Nurse Practitioner

## 2019-08-20 ENCOUNTER — Encounter (HOSPITAL_COMMUNITY): Payer: Self-pay | Admitting: *Deleted

## 2019-08-20 ENCOUNTER — Emergency Department (HOSPITAL_COMMUNITY): Payer: BC Managed Care – PPO

## 2019-08-20 ENCOUNTER — Other Ambulatory Visit: Payer: Self-pay

## 2019-08-20 ENCOUNTER — Emergency Department (HOSPITAL_COMMUNITY)
Admission: EM | Admit: 2019-08-20 | Discharge: 2019-08-20 | Disposition: A | Discharge status: Home / Self Care | Payer: BC Managed Care – PPO | Attending: Emergency Medicine | Admitting: Emergency Medicine

## 2019-08-20 DIAGNOSIS — R0789 Other chest pain: Secondary | ICD-10-CM | POA: Diagnosis not present

## 2019-08-20 DIAGNOSIS — I252 Old myocardial infarction: Secondary | ICD-10-CM | POA: Insufficient documentation

## 2019-08-20 DIAGNOSIS — I10 Essential (primary) hypertension: Secondary | ICD-10-CM | POA: Diagnosis not present

## 2019-08-20 DIAGNOSIS — R079 Chest pain, unspecified: Secondary | ICD-10-CM | POA: Diagnosis not present

## 2019-08-20 LAB — CBC
HCT: 38 % (ref 36.0–46.0)
Hemoglobin: 12.5 g/dL (ref 12.0–15.0)
MCH: 32.2 pg (ref 26.0–34.0)
MCHC: 32.9 g/dL (ref 30.0–36.0)
MCV: 97.9 fL (ref 80.0–100.0)
Platelets: 170 10*3/uL (ref 150–400)
RBC: 3.88 MIL/uL (ref 3.87–5.11)
RDW: 12.3 % (ref 11.5–15.5)
WBC: 7 10*3/uL (ref 4.0–10.5)
nRBC: 0 % (ref 0.0–0.2)

## 2019-08-20 LAB — BASIC METABOLIC PANEL
Anion gap: 9 (ref 5–15)
BUN: 37 mg/dL — ABNORMAL HIGH (ref 6–20)
CO2: 27 mmol/L (ref 22–32)
Calcium: 9.3 mg/dL (ref 8.9–10.3)
Chloride: 102 mmol/L (ref 98–111)
Creatinine, Ser: 0.8 mg/dL (ref 0.44–1.00)
GFR calc Af Amer: >60 mL/min (ref >60)
GFR calc non Af Amer: >60 mL/min (ref >60)
Glucose, Bld: 92 mg/dL (ref 70–99)
Potassium: 3.9 mmol/L (ref 3.5–5.1)
Sodium: 138 mmol/L (ref 135–145)

## 2019-08-20 LAB — TROPONIN I (HIGH SENSITIVITY)
Troponin I (High Sensitivity): <2 ng/L (ref <18)
Troponin I (High Sensitivity): <2 ng/L (ref <18)

## 2019-08-20 MED ORDER — SODIUM CHLORIDE 0.9% FLUSH
3.0000 mL | Freq: Once | INTRAVENOUS | Status: AC
  Administered 2019-08-20: 3 mL via INTRAVENOUS

## 2019-08-20 NOTE — ED Notes (Signed)
Up to BR without assistance, gait steady.

## 2019-08-20 NOTE — ED Triage Notes (Signed)
Pt c/o chest pain that started last night that was relieved with 3 nitro last night, pt describes that pain last night as a bear having her in a hug. Chest pain returned this am, pt took two more nitro with relief of chest pain and was located in the back of chest area and right side of chest area. Pain was associated with sob.

## 2019-08-20 NOTE — Discharge Instructions (Addendum)
You were evaluated in the Emergency Department and after careful evaluation, we did not find any emergent condition requiring admission or further testing in the hospital.  Your exam/testing today is overall reassuring.  Please return to the Emergency Department if you experience any worsening of your condition.  We encourage you to follow up with a primary care provider.  Thank you for allowing [place] to be a part of your care.   Follow-up with your cardiologist in the next week

## 2019-08-20 NOTE — ED Provider Notes (Signed)
[facility] Emergency Department Provider Note MRN:  [id number]  Arrival date & time: 08/20/19     Chief Complaint   Chest Pain   History of Present Illness   [name] is a 59 y.o. year-old female with a history of GERD, Takotsubo cardiomyopathy presenting to the ED with chief complaint of chest pain.  Central chest tightness occurred last night, similar to prior esophageal issues.  Improved with nitro.  Right-sided chest pain today, again improved with nitro.  Here to make sure that her heart is okay.  Denies dizziness, no diaphoresis, no nausea, no vomiting, no shortness of breath.  No cough, no fever.  Review of Systems  A complete 10 system review of systems was obtained and all systems are negative except as noted in the HPI and PMH.   Patient's Health History    Past Medical History:  Diagnosis Date  . Anxiety   . Arthritis    back (12/30/2014)  . Childhood asthma   . Daily headache   . Depression   . GERD (gastroesophageal reflux disease)   . History of palpitations    STRESS INDUCED  . Hypercholesterolemia dx'd 12/2014  . Hypertension   . Kidney cysts    "I think it was right"  . Kidney stones   . Left ureteral calculus   . 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 NSTEMI (non-ST elevated myocardial infarction) ([place]) 12/28/2014  . PONV (postoperative nausea and vomiting)   . Takotsubo syndrome    "Broken heart syndrome"    Past Surgical History:  Procedure Laterality Date  . BACK SURGERY    . BREAST BIOPSY Bilateral early 2000's  . CARDIAC CATHETERIZATION  2006 (APPROX)  [place]   NORMAL  . CARDIAC CATHETERIZATION  12/30/2014  . CARDIAC  CATHETERIZATION N/A 12/30/2014   Procedure: Left Heart Cath and Coronary Angiography;  Surgeon: Wellington Hampshire, MD;  Location: [facility];  Service: Cardiovascular;  Laterality: N/A;  . CARDIOVASCULAR STRESS TEST  03-21-2012  DR Johnsie Cancel   NORMAL NUCLEAR STUDY/  NO ISCHEMIA/  EF 63%  . CYSTOSCOPY W/ URETERAL STENT PLACEMENT Left 03/26/2013   Procedure: CYSTOSCOPY WITH RETROGRADE PYELOGRAM ;  Surgeon: Molli Hazard, MD;  Location: [facility];  Service: Urology;  Laterality: Left;  . CYSTOSCOPY WITH RETROGRADE PYELOGRAM, URETEROSCOPY AND STENT PLACEMENT Bilateral 03/19/2013   Procedure: CYSTOSCOPY WITH RETROGRADE PYELOGRAM, BILATERAL URETEROSCOPY AND STENT PLACEMENT LEFT URETER,BILATERAL STONE EXTRACTION , HOLMIUM LASER LEFT URETER;  Surgeon: Molli Hazard, MD;  Location: [facility];  Service: Urology;  Laterality: Bilateral;  . CYSTOSCOPY WITH STENT PLACEMENT Left 03/26/2013   Procedure: CYSTOSCOPY WITH STENT PLACEMENT;  Surgeon: Molli Hazard, MD;  Location: [facility];  Service: Urology;  Laterality: Left;  . DIAGNOSTIC LAPAROSCOPY  04-12-2009  . ESOPHAGOGASTRODUODENOSCOPY (EGD) WITH PROPOFOL N/A 02/10/2016   Procedure: ESOPHAGOGASTRODUODENOSCOPY (EGD) WITH PROPOFOL;  Surgeon: Ronald Lobo, MD;  Location: [facility];  Service: Endoscopy;  Laterality: N/A;  . KIDNEY SURGERY  1966   BILATERAL URETER'S DILATATION  . LUMBAR LAMINECTOMY/DECOMPRESSION MICRODISCECTOMY  05/18/2011   Procedure: LUMBAR LAMINECTOMY/DECOMPRESSION MICRODISCECTOMY;  Surgeon: Johnn Hai;  Location: [facility];  Service: Orthopedics;  Laterality: Right;  Decompression Lumbar 4-Lumbar 5  Right    (xray)   . MELANOMA EXCISION WITH SENTINEL LYMPH NODE BIOPSY  2001   moh's procedure/  RIGHT FOREHEAD AND UPPER EYEBROW  . RIGHT LATERAL PAROTIDECTOMY W/ NERVE DISSECTION / RIGHT MODIFIED RADICAL NECK DISSECTION SPARING SCM ELEVENTH NERVE AND INTERNAL JUGULAR VEIN  09-12-2006   DR DWIGHT BATES   DR DWIGHT BATES; "inside gland; lots of lymph nodes"    Family History  Problem Relation Age of Onset  . Hypertension Mother   . COPD Mother   . Cancer Mother        breast  . Dementia Mother   . Heart disease Father   . Cancer Father        Colorectal  . Hyperlipidemia Father   . Hypertension Father     Social History   Socioeconomic History  . Marital status: Married    Spouse name: Not on file  . Number of children: Not on file  . Years of education: Not on file  . Highest education level: Not on file  Occupational History  . Occupation: Scientist, water quality at [employer]  . Smoking status: Never Smoker  . Smokeless tobacco: Never Used  Substance and Sexual Activity  . Alcohol use: Yes    Comment: 12/30/2014 "might have a beer or glass of wine maybe once/month"  . Drug use: No  . Sexual activity: Not Currently  Other Topics Concern  . Not on file  Social History Narrative  . Not on file   Social Determinants of Health   Financial Resource Strain:   . Difficulty of Paying Living Expenses: Not on file  Food Insecurity:   . Worried About Charity fundraiser in the Last Year: Not on file  . Ran Out of Food in the Last Year: Not on file  Transportation Needs:   . Lack of Transportation (Medical): Not on file  . Lack of Transportation (Non-Medical): Not on file  Physical Activity:   . Days of Exercise per Week: Not on file  . Minutes of Exercise per Session: Not on file  Stress:   . Feeling of Stress : Not on file  Social Connections:   . Frequency of Communication with Friends and Family: Not on file  . Frequency of Social Gatherings with Friends and Family: Not on file  . Attends Religious Services: Not on file  . Active Member of Clubs or Organizations: Not on file  . Attends Archivist Meetings: Not on file  . Marital Status: Not on file  Intimate Partner Violence:   . Fear of Current or Ex-Partner: Not on file  . Emotionally  Abused: Not on file  . Physically Abused: Not on file  . Sexually Abused: Not on file     Physical Exam   Vitals:   08/20/19 1430 08/20/19 1500  BP: 115/61 119/62  Pulse: 66 65  Resp: (!) 21 19  Temp:    SpO2: 97% 98%    CONSTITUTIONAL: Well-appearing, NAD NEURO:  Alert and oriented x 3, no focal deficits EYES:  eyes equal and reactive ENT/NECK:  no LAD, no JVD CARDIO: Regular rate, well-perfused, normal S1 and S2 PULM:  CTAB no wheezing or rhonchi GI/GU:  normal bowel sounds, non-distended, non-tender MSK/SPINE:  No gross deformities, no edema SKIN:  no rash, atraumatic PSYCH:  Appropriate speech and behavior  *Additional and/or pertinent findings included in MDM below  Diagnostic and Interventional Summary    EKG Interpretation  Date/Time:  Wednesday August 20 2019 13:05:45 EST Ventricular Rate:  82 PR Interval:  138 QRS Duration: 80 QT Interval:  382 QTC Calculation: 446 R Axis:   -82 Text Interpretation: Normal sinus rhythm Left axis deviation Low voltage QRS Nonspecific T wave abnormality Abnormal ECG Confirmed by Gerlene Fee [phone]) on 08/20/2019 2:49:28 PM      Cardiac Monitoring Interpretation:  Labs Reviewed  BASIC METABOLIC PANEL - Abnormal; Notable for the following components:      Result Value   BUN 37 (*)    All other components within normal limits  CBC  TROPONIN I (HIGH SENSITIVITY)  TROPONIN I (HIGH SENSITIVITY)    DG Chest 2 View  Final Result      Medications  sodium chloride flush (NS) 0.9 % injection 3 mL (3 mLs Intravenous Given 08/20/19 1429)     Procedures  /  Critical Care Procedures  ED Course and Medical Decision Making  I have reviewed the triage vital signs, the nursing notes, and pertinent available records from the EMR.  Pertinent labs & imaging results that were available during my care of the patient were reviewed by me and considered in my medical decision making (see below for details).     History of Takotsubo's  cardiomyopathy in the past, but largely normal coronary arteries based on chart review.  Has known history of esophageal spasms, which her GI doctor has told her to take nitro for discomfort.  Favoring GI etiology today, EKG is very reassuring, no ischemic changes.  Will obtain 2 troponins, if reassuring will plan to discharge.  Nothing to suggest PE or dissection.  Signed out to oncoming provider at shift change.    Barth Kirks. [place]onia Small, [place] [email or web address]  Final Clinical Impressions(s) / ED Diagnoses     ICD-10-CM   1. Chest pain, unspecified type  R07.9     ED Discharge Orders    None       Discharge Instructions Discussed with and Provided to Patient:     Discharge Instructions     You were evaluated in the Emergency Department and after careful evaluation, we did not find any emergent condition requiring admission or further testing in the hospital.  Your exam/testing today is overall reassuring.  Please return to the Emergency Department if you experience any worsening of your condition.  We encourage you to follow up with a primary care provider.  Thank you for allowing [place] to be a part of your care.       Maudie Flakes, MD 08/20/19 [phone]

## 2019-09-04 ENCOUNTER — Other Ambulatory Visit: Payer: Self-pay | Admitting: Cardiovascular Disease

## 2019-11-03 ENCOUNTER — Telehealth: Payer: Self-pay | Admitting: Family Medicine

## 2019-11-03 DIAGNOSIS — R1310 Dysphagia, unspecified: Secondary | ICD-10-CM

## 2019-11-03 NOTE — Telephone Encounter (Signed)
Placed referral for the patient. 

## 2019-11-03 NOTE — Telephone Encounter (Signed)
Requesting referral

## 2019-11-03 NOTE — Telephone Encounter (Signed)
  REFERRAL REQUEST Telephone Note 11/03/2019  What type of referral do you need? Dr buccini/ pt is having trouble swallowing. She is willing to see his pa tina. [phone]  Have you been seen at our office for this problem? yes (Advise that they may need an appointment with their PCP before a referral can be done)  Is there a particular doctor or location that you prefer? Dr Cristina Gong  Patient notified that referrals can take up to a week or longer to process. If they haven't heard anything within a week they should call back and speak with the referral department.

## 2019-11-07 ENCOUNTER — Ambulatory Visit: Payer: BC Managed Care – PPO | Admitting: Nurse Practitioner

## 2019-11-26 ENCOUNTER — Other Ambulatory Visit: Payer: Self-pay | Admitting: Physician Assistant

## 2019-11-26 DIAGNOSIS — R1310 Dysphagia, unspecified: Secondary | ICD-10-CM

## 2019-12-01 ENCOUNTER — Ambulatory Visit
Admission: RE | Admit: 2019-12-01 | Discharge: 2019-12-01 | Disposition: A | Discharge status: Home / Self Care | Payer: BC Managed Care – PPO | Source: Ambulatory Visit | Attending: Physician Assistant | Admitting: Physician Assistant

## 2019-12-01 DIAGNOSIS — R1310 Dysphagia, unspecified: Secondary | ICD-10-CM

## 2019-12-18 ENCOUNTER — Telehealth: Payer: Self-pay | Admitting: Family Medicine

## 2019-12-18 NOTE — Telephone Encounter (Signed)
Yes I am fine filling out an FMLA so she can take care of her husband, I will need to know exactly what diagnosis or what he is being treated for in the hospital.

## 2019-12-18 NOTE — Telephone Encounter (Signed)
Spoke to pt regarding FMLA papers she dropped off. She is requesting FMLA to care for her husband which is also a pt of Dr Building control surveyor. 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 Pt had dropped off forms and had completed the part that the Medical Provider is supposed to complete so advised her we would need blank forms to complete. Unsure of when her spouse will be discharged from hospital and then he will be going to Rehab facility per pt. Are you ok with filling out FMLA to care for spouse when the spouse is in the hospital and then going to rehab?   Please advise.  Pt also requested that I at least fax over the FMLA portion where the pt signed her name and the name of the family member she is caring for. I did fax page 1 and 2 as requested.

## 2019-12-24 ENCOUNTER — Ambulatory Visit: Payer: BC Managed Care – PPO | Admitting: Dermatology

## 2020-01-02 NOTE — Telephone Encounter (Signed)
Forms on providers desk for signature

## 2020-01-15 ENCOUNTER — Telehealth: Payer: Self-pay | Admitting: Family Medicine

## 2020-01-15 NOTE — Telephone Encounter (Signed)
Spoke with patient and she gave me the number to [facility] [employer] [employer] [facility] to request another FMLA form.  Tel # [phone] Her Case # [phone] IFN  I called [employer] and they will be faxing [place] another form to be filled out for patient today  Also needs paperwork for husband, Derrek Gu, from his employer to be filled out too.  She is going to call his company and see if they will send [place] another form.

## 2020-01-15 NOTE — Telephone Encounter (Signed)
LMOM to relay to patient that the [facility] paperwork that was filled out by Dr. Warrick Parisian cannot be located.  Unsure as to where the paperwork went from Dr. Warrick Parisian, but scan center has searched for this as well as our office.  Tye [place] is requesting patient to send [place] another blank form to have filled out for her.

## 2020-01-15 NOTE — Telephone Encounter (Signed)
FMLA forms filled out, signed by Dr Livia Snellen (covering for PCP)  And faxed to [facility] with confirmation page. Pt is aware it has been faxed.

## 2020-01-18 ENCOUNTER — Other Ambulatory Visit: Payer: Self-pay | Admitting: Cardiovascular Disease

## 2020-02-18 ENCOUNTER — Ambulatory Visit: Payer: BC Managed Care – PPO | Admitting: Dermatology

## 2020-02-18 ENCOUNTER — Encounter: Payer: Self-pay | Admitting: Dermatology

## 2020-02-18 ENCOUNTER — Other Ambulatory Visit: Payer: Self-pay

## 2020-02-18 DIAGNOSIS — D229 Melanocytic nevi, unspecified: Secondary | ICD-10-CM | POA: Diagnosis not present

## 2020-02-18 DIAGNOSIS — L821 Other seborrheic keratosis: Secondary | ICD-10-CM

## 2020-02-18 DIAGNOSIS — Z1283 Encounter for screening for malignant neoplasm of skin: Secondary | ICD-10-CM | POA: Diagnosis not present

## 2020-02-18 DIAGNOSIS — L814 Other melanin hyperpigmentation: Secondary | ICD-10-CM | POA: Diagnosis not present

## 2020-02-18 NOTE — Patient Instructions (Addendum)
Follow-up visit for Xitlalli Newhard date of birth 10-26-1960.  Several issues discussed.  There are flat pigmented areas on the left forearm and each cheek.  The spot on the right cheek has a very subtle texture and fits a stucco keratosis.  The one on the left cheek is macular and fits a solar lentigo; dermoscopy showed no atypia.  The one on the left forearm is an intergrade between the 2 and if they are clinically stable none of these need removal.  She has experienced a linear sinking on an area to the outside of her right mouth.  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  There is definite fibrosis and atrophy below the mandible.  There is a curvilinear 4 cm subtle indentation with no palpable pathology externally or intraorally.  No adenopathy.  I suspect this is a loss of subcutaneous mass but I cannot define why this has happened.  I do not believe imaging studies would be helpful.  On her left shin is a several month old 2 mm slightly indented pink dermal papule.  This may be an early dermatofibroma and if it is stable we will leave it.  There are no atypical moles on the back.  On the right lower back is a small seborrheic keratosis which is safe to leave.  Routine follow-up in 1 year.

## 2020-03-14 ENCOUNTER — Encounter: Payer: Self-pay | Admitting: Dermatology

## 2020-03-14 NOTE — Progress Notes (Signed)
   Follow-Up Visit   Subjective  [name] is a 59 y.o. female who presents for the following: Skin Problem (indentation of the face on right side--noticed since April--also check spost on the left and legs).  Dark spots Location: Face and arm Duration:  Quality:  Associated Signs/Symptoms: Modifying Factors:  Severity:  Timing: Context: Would like general skin check done  Objective  Well appearing patient in no apparent distress; mood and affect are within normal limits.  A full examination was performed including scalp, head, eyes, ears, nose, lips, neck, chest, axillae, abdomen, back, buttocks, bilateral upper extremities, bilateral lower extremities, hands, feet, fingers, toes, fingernails, and toenails. All findings within normal limits unless otherwise noted below.   Assessment & Plan     Follow-up visit for [name] date of birth 1960-09-10.  Several issues discussed.  There are flat pigmented areas on the left forearm and each cheek.  The spot on the right cheek has a very subtle texture and fits a stucco keratosis.  The one on the left cheek is macular and fits a solar lentigo; dermoscopy showed no atypia.  The one on the left forearm is an intergrade between the 2 and if they are clinically stable none of these need removal.  She has experienced a linear sinking on an area to the outside of her right mouth.  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  There is definite fibrosis and atrophy below the mandible.  There is a curvilinear 4 cm subtle indentation with no palpable pathology externally or intraorally.  No adenopathy.  I suspect this is a loss of subcutaneous mass but I cannot define why this has happened.  I do not believe imaging studies would be helpful.  On her left shin is a several month old 2 mm slightly indented pink dermal papule.  This may be an early dermatofibroma and if it is stable we will leave it.  There are no  atypical moles on the back.  On the right lower back is a small seborrheic keratosis which is safe to leave.  Routine follow-up in 1 year.   I, Lavonna Monarch, MD, have reviewed all documentation for this visit.  The documentation on 03/14/20 for the exam, diagnosis, procedures, and orders are all accurate and complete.

## 2020-04-06 ENCOUNTER — Telehealth: Payer: Self-pay

## 2020-04-06 ENCOUNTER — Encounter: Payer: Self-pay | Admitting: Nurse Practitioner

## 2020-04-06 ENCOUNTER — Ambulatory Visit (INDEPENDENT_AMBULATORY_CARE_PROVIDER_SITE_OTHER): Payer: BC Managed Care – PPO | Admitting: Nurse Practitioner

## 2020-04-06 ENCOUNTER — Other Ambulatory Visit: Payer: Self-pay | Admitting: Family Medicine

## 2020-04-06 DIAGNOSIS — R300 Dysuria: Secondary | ICD-10-CM

## 2020-04-06 LAB — URINALYSIS, COMPLETE
Bilirubin, UA: NEGATIVE
Glucose, UA: NEGATIVE
Ketones, UA: NEGATIVE
Leukocytes,UA: NEGATIVE
Nitrite, UA: NEGATIVE
Protein,UA: NEGATIVE
Specific Gravity, UA: 1.02 (ref 1.005–1.030)
Urobilinogen, Ur: 0.2 mg/dL (ref 0.2–1.0)
pH, UA: 5.5 (ref 5.0–7.5)

## 2020-04-06 LAB — MICROSCOPIC EXAMINATION: Bacteria, UA: NONE SEEN

## 2020-04-06 NOTE — Progress Notes (Signed)
° °  Virtual Visit via telephone Note Due to COVID-19 pandemic this visit was conducted virtually. This visit type was conducted due to national recommendations for restrictions regarding the COVID-19 Pandemic (e.g. social distancing, sheltering in place) in an effort to limit this patient's exposure and mitigate transmission in our community. All issues noted in this document were discussed and addressed.  A physical exam was not performed with this format.  I connected with [name] on 04/06/20 at 1:15 by telephone and verified that I am speaking with the correct person using two identifiers. [name] is currently located at home and no one is currently with  her during visit. The provider, Mary-Margaret Hassell Done, FNP is located in their office at time of visit.  I discussed the limitations, risks, security and privacy concerns of performing an evaluation and management service by telephone and the availability of in person appointments. I also discussed with the patient that there may be a patient responsible charge related to this service. The patient expressed understanding and agreed to proceed.   History and Present Illness:   Chief Complaint: Urinary Tract Infection   HPI  Patient having telephone visit stating that she is having right flank pain with dysuria. C/O urinary frequency and urgency. Denies fever.   Review of Systems  Constitutional: Negative for chills and fever.  Genitourinary: Positive for dysuria, flank pain, frequency and urgency. Negative for hematuria.  Musculoskeletal: Positive for back pain.  All other systems reviewed and are negative.    Observations/Objective: Alert and oriented- answers all questions appropriately No distress  Urine clear  Assessment and Plan: [name] in today with chief complaint of Urinary Tract Infection   1. Dysuria  Cotton underwear Take shower not bath Cranberry juice, yogurt Force fluids AZO over the  counter X2 days Culture pending RTO prn  - Urine Culture - Urinalysis, Complete    Follow Up Instructions: prn    I discussed the assessment and treatment plan with the patient. The patient was provided an opportunity to ask questions and all were answered. The patient agreed with the plan and demonstrated an understanding of the instructions.   The patient was advised to call back or seek an in-person evaluation if the symptoms worsen or if the condition fails to improve as anticipated.  The above assessment and management plan was discussed with the patient. The patient verbalized understanding of and has agreed to the management plan. Patient is aware to call the clinic if symptoms persist or worsen. Patient is aware when to return to the clinic for a follow-up visit. Patient educated on when it is appropriate to go to the emergency department.   Time call ended:  1:34  I provided 18 minutes of non-face-to-face time during this encounter.    Mary-Margaret Hassell Done, FNP

## 2020-04-09 ENCOUNTER — Telehealth: Payer: Self-pay

## 2020-04-09 LAB — URINE CULTURE

## 2020-04-09 MED ORDER — AMOXICILLIN 875 MG PO TABS: 875.0000 mg | ORAL_TABLET | Freq: Two times a day (BID) | ORAL | 0 refills | Status: DC

## 2020-04-09 NOTE — Telephone Encounter (Signed)
Sent in amoxicillin rx to pharmacy- pt aware

## 2020-04-15 ENCOUNTER — Other Ambulatory Visit: Payer: Self-pay | Admitting: Cardiovascular Disease

## 2020-04-25 NOTE — Progress Notes (Signed)
CARDIOLOGY OFFICE NOTE  Date:  04/27/2020    [name] Date of Birth: [birth year] Medical Record #[id number]  PCP:  Dettinger, Fransisca Kaufmann, MD  Cardiologist:  Johnsie Cancel   No chief complaint on file.   History of Present Illness: [name] is a 59 y.o. female who presents for f/u Takatsubo DCM, Abnormal ECG, labile BP and chest pain   She has a history of chronically abnormal EKG. Remote normal cath in [facility] many years ago. Admitted in 2016 with chest pain - found to have Tkatsubo with EF 45% - no CAD by cath - MRI in 02/2015 showed normal EF. Hospital visit in 06/2015 for chest pain with negative evaluation.   Cardiac CT done 05/15/17 normal with calcium score 0  Seen by primary with Otalgia on 07/08/18  Started on Augmentin and Celestone   Seen in [place] for SSCP 07/06/18 R/O normal CXR thought to have gas Reviewed All records Troponin negative x 2   Has anxiety and depression   Seen in ER 08/20/19 for chest pain right sided ? Similar to esophageal pain with spasm ECG and troponins normal  Told to f/u with GI   Now Working 5 days/week in [place]   Husband also a patient of mine and has been sick a lot this year awith need for two rehab stays. She is a bit overwhelmed by all his health issues   Rx UTI October 2021     Past Medical History:  Diagnosis Date  . Anxiety   . Arthritis    back (12/30/2014)  . Childhood asthma   . Daily headache   . Depression   . GERD (gastroesophageal reflux disease)   . History of palpitations    STRESS INDUCED  . Hypercholesterolemia dx'd 12/2014  . Hypertension   . Kidney cysts    "I think it was right"  . Kidney stones   . Left ureteral calculus   . 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 NSTEMI (non-ST elevated myocardial infarction) ([place]) 12/28/2014  . PONV (postoperative nausea and vomiting)   . Takotsubo syndrome    "Broken heart syndrome"    Past Surgical History:  Procedure Laterality Date  . BACK SURGERY    . BREAST BIOPSY Bilateral early 2000's  . CARDIAC CATHETERIZATION  2006 (APPROX)  [place]   NORMAL  . CARDIAC CATHETERIZATION  12/30/2014  . CARDIAC CATHETERIZATION N/A 12/30/2014   Procedure: Left Heart Cath and Coronary Angiography;  Surgeon: Wellington Hampshire, MD;  Location: [facility];  Service: Cardiovascular;  Laterality: N/A;  . CARDIOVASCULAR STRESS TEST  03-21-2012  DR Johnsie Cancel   NORMAL NUCLEAR STUDY/  NO ISCHEMIA/  EF 63%  . CYSTOSCOPY W/ URETERAL STENT PLACEMENT Left 03/26/2013   Procedure: CYSTOSCOPY WITH RETROGRADE PYELOGRAM ;  Surgeon: Molli Hazard, MD;  [facility];  Service: Urology;  Laterality: Left;  . CYSTOSCOPY WITH RETROGRADE PYELOGRAM, URETEROSCOPY AND STENT PLACEMENT Bilateral 03/19/2013   Procedure: CYSTOSCOPY WITH RETROGRADE PYELOGRAM, BILATERAL URETEROSCOPY AND STENT PLACEMENT LEFT URETER,BILATERAL STONE EXTRACTION , HOLMIUM LASER LEFT URETER;  Surgeon: Molli Hazard, MD;  Location: [facility];  Service: Urology;  Laterality: Bilateral;  . CYSTOSCOPY WITH STENT PLACEMENT Left 03/26/2013   Procedure: CYSTOSCOPY WITH STENT PLACEMENT;  Surgeon: Molli Hazard, MD;  Location: [facility];  Service: Urology;  Laterality: Left;  . DIAGNOSTIC LAPAROSCOPY  04-12-2009  . ESOPHAGOGASTRODUODENOSCOPY (EGD) WITH PROPOFOL N/A 02/10/2016   Procedure: ESOPHAGOGASTRODUODENOSCOPY (EGD) WITH PROPOFOL;  Surgeon: Ronald Lobo, MD;  Location: [facility];  Service: Endoscopy;  Laterality: N/A;  . KIDNEY SURGERY  1966   BILATERAL URETER'S DILATATION  . LUMBAR LAMINECTOMY/DECOMPRESSION MICRODISCECTOMY  05/18/2011   Procedure: LUMBAR  LAMINECTOMY/DECOMPRESSION MICRODISCECTOMY;  Surgeon: Johnn Hai;  Location: [facility];  Service: Orthopedics;  Laterality: Right;  Decompression Lumbar 4-Lumbar 5  Right    (xray)   . MELANOMA EXCISION WITH SENTINEL LYMPH NODE BIOPSY  2001   moh's procedure/  RIGHT FOREHEAD AND UPPER EYEBROW  . RIGHT LATERAL PAROTIDECTOMY W/ NERVE DISSECTION / RIGHT MODIFIED RADICAL NECK DISSECTION SPARING SCM ELEVENTH NERVE AND INTERNAL JUGULAR VEIN  09-12-2006  DR DWIGHT BATES   DR DWIGHT BATES; "inside gland; lots of lymph nodes"     Medications: Current Meds  Medication Sig  . albuterol (PROVENTIL HFA;VENTOLIN HFA) 108 (90 Base) MCG/ACT inhaler Inhale 2 puffs into the lungs every 6 (six) hours as needed for wheezing or shortness of breath.  Marland Kitchen aspirin 81 MG chewable tablet Chew 81 mg by mouth in the morning.   Marland Kitchen aspirin-acetaminophen-caffeine (EXCEDRIN MIGRAINE) 250-250-65 MG tablet Take 2 tablets by mouth every 6 (six) hours as needed for headache.  Marland Kitchen atorvastatin (LIPITOR) 20 MG tablet Take 1 tablet by mouth once daily  . Calcium Citrate-Vitamin D (CITRACAL + D PO) Take 1 tablet by mouth in the morning.  . carvedilol (COREG) 3.125 MG tablet TAKE 1 TABLET BY MOUTH TWICE DAILY WITH A MEAL  . docusate sodium (COLACE) 100 MG capsule Take 200 mg by mouth daily as needed for mild constipation.  Marland Kitchen lisinopril (ZESTRIL) 2.5 MG tablet TAKE 1 TABLET BY MOUTH IN THE MORNING  . Multiple Vitamin (MULTIVITAMIN) tablet Take 1 tablet by mouth in the morning.   . nitroGLYCERIN (NITROSTAT) 0.4 MG SL tablet Place 1 tablet (0.4 mg total) under the tongue every 5 (five) minutes as needed for chest pain. DISSOLVE ONE TABLET UNDER THE TONGUE EVERY 5 MINUTES AS NEEDED FOR CHEST PAIN.  DO NOT EXCEED A TOTAL OF 3 DOSES IN 15 MINUTES  . pantoprazole (PROTONIX) 40 MG tablet Take 40 mg by mouth 2 (two) times daily.     Allergies: Allergies  Allergen Reactions  . Cephalexin Rash and Shortness Of Breath  . Lorazepam Shortness Of  Breath and Other (See Comments)    She may be very sensitive to benzo.  Other reaction(s): Other (See Comments) She may be very sensitive to benzo.  Other reaction(s): Other (See Comments) She may be very sensitive to benzo.  Other reaction(s): Other (See Comments) She may be very sensitive to benzo.  She may be very sensitive to benzo.  She may be very sensitive to benzo.  Other reaction(s): Other (See Comments) She may be very sensitive to benzo.     Social History: The patient  reports that she has never smoked. She has never used smokeless tobacco. She reports current alcohol use. She reports that she does not use drugs.   Family History: The patient's family history includes COPD in her mother; Cancer in her father and mother; Dementia in her mother; Heart disease in her father; Hyperlipidemia in her  father; Hypertension in her father and mother.   Review of Systems: Please see the history of present illness.   Otherwise, the review of systems is positive for none.   All other systems are reviewed and negative.   Physical Exam: VS:  BP (!) 142/86   Pulse 84   Ht 5\' 4"  (1.626 m)   Wt 90.5 kg   LMP 05/18/2011   SpO2 98%   BMI 34.26 kg/m  .  BMI Body mass index is 34.26 kg/m.  Wt Readings from Last 3 Encounters:  04/27/20 90.5 kg  08/20/19 86.2 kg  04/14/19 83.9 kg   Affect appropriate Healthy:  appears stated age 68: normal Neck supple with no adenopathy JVP normal no bruits no thyromegaly Lungs clear with no wheezing and good diaphragmatic motion Heart:  S1/S2 no murmur, no rub, gallop or click PMI normal Abdomen: benighn, BS positve, no tenderness, no AAA no bruit.  No HSM or HJR Distal pulses intact with no bruits No edema Neuro non-focal Skin warm and dry No muscular weakness    LABORATORY DATA:  EKG:  03/28/17  NSR with anterior T wave changes chronic .  [facility] Results  Component Value Date   WBC 7.0 08/20/2019   HGB 12.5 08/20/2019   HCT 38.0  08/20/2019   PLT 170 08/20/2019   GLUCOSE 92 08/20/2019   CHOL 146 01/03/2018   TRIG 101 01/03/2018   HDL 50 01/03/2018   LDLCALC 76 01/03/2018   ALT 28 10/07/2018   AST 27 10/07/2018   NA 138 08/20/2019   K 3.9 08/20/2019   CL 102 08/20/2019   CREATININE 0.80 08/20/2019   BUN 37 (H) 08/20/2019   CO2 27 08/20/2019   TSH 2.360 07/05/2017   INR 1.10 12/30/2014   HGBA1C 5.5 01/03/2017     BNP (last 3 results) No results for input(s): BNP in the last 8760 hours.  ProBNP (last 3 results) No results for input(s): PROBNP in the last 8760 hours.   Other Studies Reviewed Today:  Cardiac MRI IMPRESSION 02/2015: 1) Normal LV size and function  2) Quantitative EF 64% no RWMA;s  3) No scar tissue or hyperenhancement  Overall findings consistent with Takatsubo DCM  Electronically Signed   By: Jenkins Rouge M.D.   On: 03/18/2015 16:52   Echo Study Conclusions from 12/2014  - Left ventricle: The cavity size was normal. Systolic function was   mildly reduced. The estimated ejection fraction was 45%. Diffuse   hypokinesis. Doppler parameters are consistent with abnormal left   ventricular relaxation (grade 1 diastolic dysfunction). There was   no evidence of elevated ventricular filling pressure by Doppler   parameters. - Aortic valve: Trileaflet; normal thickness leaflets. There was   trivial regurgitation. - Aortic root: The aortic root was normal in size. - Mitral valve: Structurally normal valve. There was no   regurgitation. - Left atrium: The atrium was normal in size. - Right ventricle: Systolic function was normal. - Right atrium: The atrium was normal in size. - Tricuspid valve: There was trivial regurgitation. - Pulmonic valve: There was no regurgitation. - Pulmonary arteries: Systolic pressure was within the normal   range. - Inferior vena cava: The vessel was normal in size. - Pericardium, extracardiac: There was no pericardial effusion.    Procedures    Left Heart Cath and Coronary Angiography 12/2014  Conclusion   1. Normal coronary arteries. 2. Moderately to severely reduced LV systolic function with an ejection fraction of 30-35% with severe  hypokinesis of the mid distal anterior, apical and mid to distal inferior walls consistent with stress-induced cardiomyopathy. 3. Mildly elevated left ventricular end-diastolic pressure.  Recommendations: Recommend a small dose beta blocker and ACE inhibitor. Avoid stress. Monitor for at least another day and possible discharge home tomorrow if she remains stable.     Assessment/Plan:  1. Chest pain: no CAD cath 2016    Normal cardiac CT with calcium score 0 05/15/17 non cardiac pain Observe  History of esophageal spasm f/u GI   2. HTN - improved continue current meds   3. Takotsubo DCM  - EF 64% recovered by MRI 03/18/15 . She has no symptoms of CHF. Would keep on her current regimen for now.  At risk for recurrence given current anxiety and depression     F/U in a year   Jenkins Rouge

## 2020-04-27 ENCOUNTER — Other Ambulatory Visit: Payer: Self-pay

## 2020-04-27 ENCOUNTER — Encounter: Payer: Self-pay | Admitting: Cardiovascular Disease

## 2020-04-27 ENCOUNTER — Ambulatory Visit: Payer: BC Managed Care – PPO | Admitting: Cardiovascular Disease

## 2020-04-27 VITALS — BP 142/86 | HR 84 | Ht 64.0 in | Wt 199.6 lb

## 2020-04-27 DIAGNOSIS — R079 Chest pain, unspecified: Secondary | ICD-10-CM | POA: Diagnosis not present

## 2020-04-27 MED ORDER — CARVEDILOL 3.125 MG PO TABS: ORAL_TABLET | ORAL | 3 refills | Status: DC

## 2020-04-27 MED ORDER — ATORVASTATIN CALCIUM 20 MG PO TABS: 20.0000 mg | ORAL_TABLET | Freq: Every day | ORAL | 3 refills | Status: DC

## 2020-04-27 MED ORDER — NITROGLYCERIN 0.4 MG SL SUBL: 0.4000 mg | SUBLINGUAL_TABLET | SUBLINGUAL | 3 refills | Status: DC | PRN

## 2020-04-27 MED ORDER — LISINOPRIL 2.5 MG PO TABS: 2.5000 mg | ORAL_TABLET | Freq: Every morning | ORAL | 3 refills | Status: DC

## 2020-04-27 NOTE — Patient Instructions (Signed)
Medication Instructions:  *If you need a refill on your cardiac medications before your next appointment, please call your pharmacy*  Lab Work: If you have labs (blood work) drawn today and your tests are completely normal, you will receive your results only by: . MyChart Message (if you have MyChart) OR . A paper copy in the mail If you have any lab test that is abnormal or we need to change your treatment, we will call you to review the results.  Follow-Up: At [facility], you and your health needs are our priority.  As part of our continuing mission to provide you with exceptional heart care, we have created designated Provider Care Teams.  These Care Teams include your primary Cardiologist (physician) and Advanced Practice Providers (APPs -  Physician Assistants and Nurse Practitioners) who all work together to provide you with the care you need, when you need it.  We recommend signing up for the patient portal called "MyChart".  Sign up information is provided on this After Visit Summary.  MyChart is used to connect with patients for Virtual Visits (Telemedicine).  Patients are able to view lab/test results, encounter notes, upcoming appointments, etc.  Non-urgent messages can be sent to your provider as well.   To learn more about what you can do with MyChart, go to [email or web address].    Your next appointment:   6 month(s)  The format for your next appointment:   In Person  Provider:   You may see Peter Nishan, MD or one of the following Advanced Practice Providers on your designated Care Team:    Lori Gerhardt, NP  Laura Ingold, NP  Jill McDaniel, NP    

## 2020-05-06 ENCOUNTER — Telehealth: Payer: Self-pay | Admitting: Family Medicine

## 2020-05-10 ENCOUNTER — Encounter: Payer: Self-pay | Admitting: Family Medicine

## 2020-05-10 ENCOUNTER — Ambulatory Visit (INDEPENDENT_AMBULATORY_CARE_PROVIDER_SITE_OTHER): Payer: BC Managed Care – PPO | Admitting: Family Medicine

## 2020-05-10 ENCOUNTER — Other Ambulatory Visit: Payer: Self-pay

## 2020-05-10 VITALS — BP 148/71 | HR 78 | Temp 97.7°F | Ht 64.0 in | Wt 197.0 lb

## 2020-05-10 DIAGNOSIS — R229 Localized swelling, mass and lump, unspecified: Secondary | ICD-10-CM

## 2020-05-10 NOTE — Progress Notes (Signed)
BP (!) 148/71   Pulse 78   Temp 97.7 F (36.5 C)   Ht 5\' 4"  (1.626 m)   Wt 197 lb (89.4 kg)   LMP 05/18/2011   SpO2 94%   BMI 33.81 kg/m    Subjective:   Patient ID: [name], female    DOB: 1960/06/20, 59 y.o.   MRN: [id number]  HPI: [name] is a 59 y.o. female presenting on 05/10/2020 for leg nodule (left leg. Painful to touch.)   HPI Patient has a nodule on her left anterior lower leg, mid shin that has popped up over the past couple weeks that she has felt it.  She says she has not noticed any warmth or redness or overlying skin changes.  It is slightly tender to touch but only to touch.  She does have a history of melanoma so that makes her little more concerned about any spots that pop up.  She denies any pain or redness or warmth or nodules anywhere else.  Relevant past medical, surgical, family and social history reviewed and updated as indicated. Interim medical history since our last visit reviewed. Allergies and medications reviewed and updated.  Review of Systems  Constitutional: Negative for chills and fever.  Eyes: Negative for visual disturbance.  Cardiovascular: Negative for leg swelling.  Skin: Negative for color change, rash and wound.  Neurological: Negative for light-headedness and headaches.  Psychiatric/Behavioral: Negative for agitation and behavioral problems.  All other systems reviewed and are negative.   Per HPI unless specifically indicated above   Allergies as of 05/10/2020      Reactions   Cephalexin Rash, Shortness Of Breath   Lorazepam Shortness Of Breath, Other (See Comments)   She may be very sensitive to benzo.  Other reaction(s): Other (See Comments) She may be very sensitive to benzo.  Other reaction(s): Other (See Comments) She may be very sensitive to benzo.  Other reaction(s): Other (See Comments) She may be very sensitive to benzo.  She may be very sensitive to benzo.  She may be very sensitive to benzo.    Other reaction(s): Other (See Comments) She may be very sensitive to benzo.       Medication List       Accurate as of May 10, 2020  9:36 AM. If you have any questions, ask your nurse or doctor.        albuterol 108 (90 Base) MCG/ACT inhaler Commonly known as: VENTOLIN HFA Inhale 2 puffs into the lungs every 6 (six) hours as needed for wheezing or shortness of breath.   aspirin 81 MG chewable tablet Chew 81 mg by mouth in the morning.   aspirin-acetaminophen-caffeine 250-250-65 MG tablet Commonly known as: EXCEDRIN MIGRAINE Take 2 tablets by mouth every 6 (six) hours as needed for headache.   atorvastatin 20 MG tablet Commonly known as: LIPITOR Take 1 tablet (20 mg total) by mouth daily.   carvedilol 3.125 MG tablet Commonly known as: COREG TAKE 1 TABLET BY MOUTH TWICE DAILY WITH A MEAL   CITRACAL + D PO Take 1 tablet by mouth in the morning.   docusate sodium 100 MG capsule Commonly known as: COLACE Take 200 mg by mouth daily as needed for mild constipation.   lisinopril 2.5 MG tablet Commonly known as: ZESTRIL Take 1 tablet (2.5 mg total) by mouth every morning.   multivitamin tablet Take 1 tablet by mouth in the morning.   nitroGLYCERIN 0.4 MG SL tablet Commonly known as: NITROSTAT Place  1 tablet (0.4 mg total) under the tongue every 5 (five) minutes as needed for chest pain. DISSOLVE ONE TABLET UNDER THE TONGUE EVERY 5 MINUTES AS NEEDED FOR CHEST PAIN.  DO NOT EXCEED A TOTAL OF 3 DOSES IN 15 MINUTES   pantoprazole 40 MG tablet Commonly known as: PROTONIX Take 40 mg by mouth 2 (two) times daily.        Objective:   BP (!) 148/71   Pulse 78   Temp 97.7 F (36.5 C)   Ht 5\' 4"  (1.626 m)   Wt 197 lb (89.4 kg)   LMP 05/18/2011   SpO2 94%   BMI 33.81 kg/m   Wt Readings from Last 3 Encounters:  05/10/20 197 lb (89.4 kg)  04/27/20 199 lb 9.6 oz (90.5 kg)  08/20/19 190 lb (86.2 kg)    Physical Exam Vitals and nursing note reviewed.   Constitutional:      Appearance: Normal appearance.  Skin:    General: Skin is warm and dry.     Findings: No ecchymosis, erythema or wound.       Neurological:     Mental Status: She is alert.       Assessment & Plan:   Problem List Items Addressed This Visit    None    Visit Diagnoses    Skin nodule    -  Primary   Relevant Orders   [place] LT LOWER EXTREM LTD SOFT TISSUE NON VASCULAR      Looks like possibly varicose veins, will do ultrasound just because of history. Follow up plan: Return if symptoms worsen or fail to improve.  Counseling provided for all of the vaccine components Orders Placed This Encounter  Procedures  . [place] LT LOWER EXTREM LTD SOFT TISSUE NON VASCULAR    Caryl Pina, MD [facility] 05/10/2020, 9:36 AM

## 2020-05-11 ENCOUNTER — Ambulatory Visit (HOSPITAL_COMMUNITY): Payer: BC Managed Care – PPO

## 2020-05-12 ENCOUNTER — Ambulatory Visit (HOSPITAL_COMMUNITY)
Admission: RE | Admit: 2020-05-12 | Discharge: 2020-05-12 | Disposition: A | Discharge status: Home / Self Care | Payer: BC Managed Care – PPO | Source: Ambulatory Visit | Attending: Family Medicine | Admitting: Family Medicine

## 2020-05-12 ENCOUNTER — Other Ambulatory Visit: Payer: Self-pay

## 2020-05-12 DIAGNOSIS — R229 Localized swelling, mass and lump, unspecified: Secondary | ICD-10-CM | POA: Insufficient documentation

## 2020-05-17 ENCOUNTER — Telehealth: Payer: Self-pay | Admitting: Family Medicine

## 2020-05-17 NOTE — Telephone Encounter (Signed)
Yes that is fine

## 2020-05-17 NOTE — Telephone Encounter (Signed)
Pt wants to discuss ultrasound results and what she needs to do going forward

## 2020-05-17 NOTE — Telephone Encounter (Signed)
Aware of results. 

## 2020-05-17 NOTE — Telephone Encounter (Signed)
Patient called back and states she is not comfortable waiting a few weeks.  Would like for Dr. Warrick Parisian to go ahead and order further imaging so we can figure out what is going on.  Would like the referral to be placed stat again.  Please advise

## 2020-05-17 NOTE — Telephone Encounter (Signed)
I guess see if he can fit her in some place tomorrow that does not look too packed, we could see her tomorrow sometime.  Preferably in the morning

## 2020-05-17 NOTE — Telephone Encounter (Signed)
Are you talking about squeezing her in on your schedule tomorrow?

## 2020-05-18 NOTE — Telephone Encounter (Signed)
I offered patient an appointment in the morning but she has to work and cannot come in then.  I scheduled her for a followup with you on 06/02/20, early morning, in a spot that I thought may not get you too behind.

## 2020-05-27 DIAGNOSIS — S32000A Wedge compression fracture of unspecified lumbar vertebra, initial encounter for closed fracture: Secondary | ICD-10-CM

## 2020-05-27 HISTORY — DX: Wedge compression fracture of unspecified lumbar vertebra, initial encounter for closed fracture: S32.000A

## 2020-06-02 ENCOUNTER — Other Ambulatory Visit: Payer: Self-pay

## 2020-06-02 ENCOUNTER — Ambulatory Visit: Payer: BC Managed Care – PPO | Admitting: Family Medicine

## 2020-06-02 ENCOUNTER — Encounter: Payer: Self-pay | Admitting: Family Medicine

## 2020-06-02 VITALS — BP 146/72 | HR 75 | Ht 64.0 in | Wt 198.0 lb

## 2020-06-02 DIAGNOSIS — E782 Mixed hyperlipidemia: Secondary | ICD-10-CM | POA: Diagnosis not present

## 2020-06-02 DIAGNOSIS — K219 Gastro-esophageal reflux disease without esophagitis: Secondary | ICD-10-CM

## 2020-06-02 DIAGNOSIS — I10 Essential (primary) hypertension: Secondary | ICD-10-CM

## 2020-06-02 NOTE — Progress Notes (Signed)
BP (!) 146/72   Pulse 75   Ht $R'5\' 4"'Zm$  (1.626 m)   Wt 198 lb (89.8 kg)   LMP 05/18/2011   SpO2 97%   BMI 33.99 kg/m    Subjective:   Patient ID: [name], female    DOB: 08-Jul-1960, 59 y.o.   MRN: [id number]  HPI: [name] is a 59 y.o. female presenting on 06/02/2020 for Leg Pain (S/p u/s for LLE )   HPI Hypertension Patient is currently on lisinopril, and their blood pressure today is 146/72. Patient denies any lightheadedness or dizziness. Patient denies headaches, blurred vision, chest pains, shortness of breath, or weakness. Denies any side effects from medication and is content with current medication.   Hyperlipidemia Patient is coming in for recheck of his hyperlipidemia. The patient is currently taking statin. They deny any issues with myalgias or history of liver damage from it. They deny any focal numbness or weakness or chest pain.   GERD Patient is currently on pantoprazole.  She denies any major symptoms or abdominal pain or belching or burping. She denies any blood in her stool or lightheadedness or dizziness.   Relevant past medical, surgical, family and social history reviewed and updated as indicated. Interim medical history since our last visit reviewed. Allergies and medications reviewed and updated.  Review of Systems  Constitutional: Negative for chills and fever.  Eyes: Negative for redness and visual disturbance.  Respiratory: Negative for chest tightness and shortness of breath.   Cardiovascular: Negative for chest pain and leg swelling.  Musculoskeletal: Negative for back pain and gait problem.  Skin: Negative for rash.  Neurological: Negative for light-headedness and headaches.  Psychiatric/Behavioral: Negative for agitation and behavioral problems.  All other systems reviewed and are negative.   Per HPI unless specifically indicated above   Allergies as of 06/02/2020      Reactions   Cephalexin Rash, Shortness Of Breath    Lorazepam Shortness Of Breath, Other (See Comments)   She may be very sensitive to benzo.  Other reaction(s): Other (See Comments) She may be very sensitive to benzo.  Other reaction(s): Other (See Comments) She may be very sensitive to benzo.  Other reaction(s): Other (See Comments) She may be very sensitive to benzo.  She may be very sensitive to benzo.  She may be very sensitive to benzo.  Other reaction(s): Other (See Comments) She may be very sensitive to benzo.       Medication List       Accurate as of June 02, 2020  9:26 AM. If you have any questions, ask your nurse or doctor.        albuterol 108 (90 Base) MCG/ACT inhaler Commonly known as: VENTOLIN HFA Inhale 2 puffs into the lungs every 6 (six) hours as needed for wheezing or shortness of breath.   aspirin 81 MG chewable tablet Chew 81 mg by mouth in the morning.   aspirin-acetaminophen-caffeine 250-250-65 MG tablet Commonly known as: EXCEDRIN MIGRAINE Take 2 tablets by mouth every 6 (six) hours as needed for headache.   atorvastatin 20 MG tablet Commonly known as: LIPITOR Take 1 tablet (20 mg total) by mouth daily.   carvedilol 3.125 MG tablet Commonly known as: COREG TAKE 1 TABLET BY MOUTH TWICE DAILY WITH A MEAL   CITRACAL + D PO Take 1 tablet by mouth in the morning.   docusate sodium 100 MG capsule Commonly known as: COLACE Take 200 mg by mouth daily as needed for mild constipation.  lisinopril 2.5 MG tablet Commonly known as: ZESTRIL Take 1 tablet (2.5 mg total) by mouth every morning.   multivitamin tablet Take 1 tablet by mouth in the morning.   nitroGLYCERIN 0.4 MG SL tablet Commonly known as: NITROSTAT Place 1 tablet (0.4 mg total) under the tongue every 5 (five) minutes as needed for chest pain. DISSOLVE ONE TABLET UNDER THE TONGUE EVERY 5 MINUTES AS NEEDED FOR CHEST PAIN.  DO NOT EXCEED A TOTAL OF 3 DOSES IN 15 MINUTES   pantoprazole 40 MG tablet Commonly known as: PROTONIX Take  40 mg by mouth 2 (two) times daily.        Objective:   BP (!) 146/72   Pulse 75   Ht $R'5\' 4"'gY$  (1.626 m)   Wt 198 lb (89.8 kg)   LMP 05/18/2011   SpO2 97%   BMI 33.99 kg/m   Wt Readings from Last 3 Encounters:  06/02/20 198 lb (89.8 kg)  05/10/20 197 lb (89.4 kg)  04/27/20 199 lb 9.6 oz (90.5 kg)    Physical Exam Vitals and nursing note reviewed.  Constitutional:      General: She is not in acute distress.    Appearance: She is well-developed and well-nourished. She is not diaphoretic.  Eyes:     Extraocular Movements: EOM normal.     Conjunctiva/sclera: Conjunctivae normal.  Cardiovascular:     Rate and Rhythm: Normal rate and regular rhythm.     Pulses: Intact distal pulses.     Heart sounds: Normal heart sounds. No murmur heard.   Pulmonary:     Effort: Pulmonary effort is normal. No respiratory distress.     Breath sounds: Normal breath sounds. No wheezing.  Musculoskeletal:        General: No tenderness or edema. Normal range of motion.  Skin:    General: Skin is warm and dry.     Findings: No rash.  Neurological:     Mental Status: She is alert and oriented to person, place, and time.     Coordination: Coordination normal.  Psychiatric:        Mood and Affect: Mood and affect normal.        Behavior: Behavior normal.       Assessment & Plan:   Problem List Items Addressed This Visit      Cardiovascular and Mediastinum   Essential (primary) hypertension (Chronic)   Relevant Orders   CMP14+EGFR     Digestive   GERD (gastroesophageal reflux disease)   Relevant Orders   CBC with Differential/Platelet     Other   Hyperlipidemia - Primary (Chronic)   Relevant Orders   Lipid panel      Continue current medication, no changes. Follow up plan: Return in about 6 months (around 12/01/2020), or if symptoms worsen or fail to improve, for Tension and GERD and hyperlipidemia.  Counseling provided for all of the vaccine components No orders of the  defined types were placed in this encounter.   Caryl Pina, MD [facility] 06/02/2020, 9:26 AM

## 2020-06-16 ENCOUNTER — Telehealth: Payer: Self-pay

## 2020-06-16 NOTE — Telephone Encounter (Signed)
Called patient, no answer, left message to return call 

## 2020-06-16 NOTE — Telephone Encounter (Signed)
Pt returned missed call from nurse. Says she ended up going to the Urgent Care and they prescribed her some medicine.

## 2020-06-16 NOTE — Telephone Encounter (Signed)
Pt is going to make an appt to be tested for COVID but says in the mean time, she wants providers advise on what she can take OTC to help with her symptoms, which are headache, ear pain, tooth pain, drainage/congestion.  Please advise and call patient.

## 2020-06-30 ENCOUNTER — Other Ambulatory Visit: Payer: Self-pay | Admitting: Urology

## 2020-07-06 ENCOUNTER — Other Ambulatory Visit (HOSPITAL_COMMUNITY)
Admission: RE | Admit: 2020-07-06 | Discharge: 2020-07-06 | Disposition: A | Discharge status: Home / Self Care | Payer: BC Managed Care – PPO | Source: Ambulatory Visit | Attending: Urology | Admitting: Urology

## 2020-07-06 ENCOUNTER — Other Ambulatory Visit: Payer: Self-pay

## 2020-07-06 ENCOUNTER — Encounter (HOSPITAL_BASED_OUTPATIENT_CLINIC_OR_DEPARTMENT_OTHER): Payer: Self-pay | Admitting: Urology

## 2020-07-06 DIAGNOSIS — Z20822 Contact with and (suspected) exposure to covid-19: Secondary | ICD-10-CM | POA: Insufficient documentation

## 2020-07-06 DIAGNOSIS — N200 Calculus of kidney: Secondary | ICD-10-CM | POA: Diagnosis not present

## 2020-07-06 DIAGNOSIS — R310 Gross hematuria: Secondary | ICD-10-CM | POA: Diagnosis not present

## 2020-07-06 DIAGNOSIS — Z87442 Personal history of urinary calculi: Secondary | ICD-10-CM | POA: Diagnosis not present

## 2020-07-06 DIAGNOSIS — Z7982 Long term (current) use of aspirin: Secondary | ICD-10-CM | POA: Diagnosis not present

## 2020-07-06 DIAGNOSIS — Z79899 Other long term (current) drug therapy: Secondary | ICD-10-CM | POA: Diagnosis not present

## 2020-07-06 DIAGNOSIS — Z01818 Encounter for other preprocedural examination: Secondary | ICD-10-CM | POA: Insufficient documentation

## 2020-07-06 DIAGNOSIS — Z881 Allergy status to other antibiotic agents status: Secondary | ICD-10-CM | POA: Diagnosis not present

## 2020-07-06 DIAGNOSIS — Z888 Allergy status to other drugs, medicaments and biological substances status: Secondary | ICD-10-CM | POA: Diagnosis not present

## 2020-07-06 DIAGNOSIS — Z791 Long term (current) use of non-steroidal anti-inflammatories (NSAID): Secondary | ICD-10-CM | POA: Diagnosis not present

## 2020-07-06 NOTE — Progress Notes (Addendum)
ADDENDUM:  Chart reviewed by anesthesia, Konrad Felix PA, ok to proceed.   Spoke w/ via phone for pre-op interview--- PT Lab needs dos----Istat and ask mda if they want another ekg (order entered)               Lab results------ current ekg dated 08-20-2019 in epic/ chart COVID test ------ 07-06-2020 @ 1450 Arrive at ------- 0930 NPO after MN NO Solid Food.  Clear liquids from MN until--- 0830 Medications to take morning of surgery ----- Coreg, Protonix, Claritin, Hydocodone Diabetic medication ----- n/a Patient Special Instructions ----- n/a Pre-Op special Istructions ----- n/a Patient verbalized understanding of instructions that were given at this phone interview. Patient denies shortness of breath, chest pain, fever, cough at this phone interview.   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 Pt stated last taken a nitro approx. 6 months ago, relief after one.  Chart to be reviewed by anesthesia, Konrad Felix PA.  PCP:  Dr Lenna Sciara. [place]  ([place] 06-05-2020 epic) Cardiologist :  Dr Johnsie Cancel ([place] 04-27-2020 epic) Chest x-ray :   08-20-2019 epic EKG :  08-20-2019 epic Echo : 01-06-2015 epic Stress test:  03-21-2012 epic Cardiac Cath :  [id number] epic Cardiac MRI:  03-15-2015 epic Activity level:  Pt denies sob w/ any activity Sleep Study/ CPAP :  NO Fasting Blood Sugar :      / Checks Blood Sugar -- times a day:  N/A Blood Thinner/ Instructions Maryjane Hurter Dose:  NO ASA / Instructions/ Last Dose : ASA 81mg  Pt stated was told by Dr Gloriann Loan office to stop 5 days prior to surgery , last dose 07-02-2020.

## 2020-07-07 ENCOUNTER — Ambulatory Visit (HOSPITAL_BASED_OUTPATIENT_CLINIC_OR_DEPARTMENT_OTHER)
Admission: RE | Admit: 2020-07-07 | Discharge: 2020-07-07 | Disposition: A | Discharge status: Home / Self Care | Payer: BC Managed Care – PPO | Attending: Urology | Admitting: Urology

## 2020-07-07 ENCOUNTER — Encounter (HOSPITAL_BASED_OUTPATIENT_CLINIC_OR_DEPARTMENT_OTHER)
Admission: RE | Disposition: A | Discharge status: Home / Self Care | Payer: Self-pay | Source: Home / Self Care | Attending: Urology

## 2020-07-07 ENCOUNTER — Ambulatory Visit (HOSPITAL_BASED_OUTPATIENT_CLINIC_OR_DEPARTMENT_OTHER): Payer: BC Managed Care – PPO | Admitting: Physician Assistant

## 2020-07-07 ENCOUNTER — Encounter (HOSPITAL_BASED_OUTPATIENT_CLINIC_OR_DEPARTMENT_OTHER): Payer: Self-pay | Admitting: Urology

## 2020-07-07 ENCOUNTER — Other Ambulatory Visit: Payer: Self-pay

## 2020-07-07 DIAGNOSIS — Z87442 Personal history of urinary calculi: Secondary | ICD-10-CM | POA: Insufficient documentation

## 2020-07-07 DIAGNOSIS — Z791 Long term (current) use of non-steroidal anti-inflammatories (NSAID): Secondary | ICD-10-CM | POA: Insufficient documentation

## 2020-07-07 DIAGNOSIS — Z888 Allergy status to other drugs, medicaments and biological substances status: Secondary | ICD-10-CM | POA: Diagnosis not present

## 2020-07-07 DIAGNOSIS — N200 Calculus of kidney: Secondary | ICD-10-CM | POA: Insufficient documentation

## 2020-07-07 DIAGNOSIS — Z20822 Contact with and (suspected) exposure to covid-19: Secondary | ICD-10-CM | POA: Insufficient documentation

## 2020-07-07 DIAGNOSIS — R310 Gross hematuria: Secondary | ICD-10-CM | POA: Diagnosis not present

## 2020-07-07 DIAGNOSIS — Z79899 Other long term (current) drug therapy: Secondary | ICD-10-CM | POA: Insufficient documentation

## 2020-07-07 DIAGNOSIS — Z881 Allergy status to other antibiotic agents status: Secondary | ICD-10-CM | POA: Insufficient documentation

## 2020-07-07 DIAGNOSIS — Z7982 Long term (current) use of aspirin: Secondary | ICD-10-CM | POA: Insufficient documentation

## 2020-07-07 HISTORY — DX: Other chronic pain: G89.29

## 2020-07-07 HISTORY — DX: Presence of spectacles and contact lenses: Z97.3

## 2020-07-07 HISTORY — DX: Cerebral cysts: G93.0

## 2020-07-07 HISTORY — DX: Hyperlipidemia, unspecified: E78.5

## 2020-07-07 HISTORY — DX: Dysuria: R30.0

## 2020-07-07 HISTORY — DX: Urgency of urination: R39.15

## 2020-07-07 HISTORY — DX: Personal history of other diseases of the digestive system: Z87.19

## 2020-07-07 HISTORY — DX: Frequency of micturition: R35.0

## 2020-07-07 HISTORY — DX: Headache, unspecified: R51.9

## 2020-07-07 HISTORY — DX: Personal history of other diseases of the respiratory system: Z87.09

## 2020-07-07 HISTORY — PX: CYSTOSCOPY/URETEROSCOPY/HOLMIUM LASER/STENT PLACEMENT: SHX6546

## 2020-07-07 HISTORY — DX: Muscle spasm of back: M62.830

## 2020-07-07 HISTORY — DX: Unspecified disorder of middle ear and mastoid, unspecified ear: H74.90

## 2020-07-07 HISTORY — DX: Low back pain, unspecified: M54.50

## 2020-07-07 HISTORY — DX: Other allergic rhinitis: J30.89

## 2020-07-07 HISTORY — DX: Other complications of anesthesia, initial encounter: T88.59XA

## 2020-07-07 HISTORY — DX: Chronic systolic (congestive) heart failure: I50.22

## 2020-07-07 HISTORY — DX: Dilated cardiomyopathy: I42.0

## 2020-07-07 LAB — POCT I-STAT, CHEM 8
BUN: 19 mg/dL (ref 8–23)
Calcium, Ion: 1.26 mmol/L (ref 1.15–1.40)
Chloride: 104 mmol/L (ref 98–111)
Creatinine, Ser: 0.8 mg/dL (ref 0.44–1.00)
Glucose, Bld: 94 mg/dL (ref 70–99)
HCT: 34 % — ABNORMAL LOW (ref 36.0–46.0)
Hemoglobin: 11.6 g/dL — ABNORMAL LOW (ref 12.0–15.0)
Potassium: 4.2 mmol/L (ref 3.5–5.1)
Sodium: 142 mmol/L (ref 135–145)
TCO2: 26 mmol/L (ref 22–32)

## 2020-07-07 LAB — SARS CORONAVIRUS 2 (TAT 6-24 HRS): SARS Coronavirus 2: NEGATIVE

## 2020-07-07 SURGERY — CYSTOSCOPY/URETEROSCOPY/HOLMIUM LASER/STENT PLACEMENT
Anesthesia: General | Site: Ureter | Laterality: Left

## 2020-07-07 MED ORDER — CIPROFLOXACIN IN D5W 400 MG/200ML IV SOLN
INTRAVENOUS | Status: AC
  Filled 2020-07-07: qty 200

## 2020-07-07 MED ORDER — PROPOFOL 10 MG/ML IV BOLUS
INTRAVENOUS | Status: AC
  Filled 2020-07-07: qty 40

## 2020-07-07 MED ORDER — LIDOCAINE HCL (PF) 2 % IJ SOLN
INTRAMUSCULAR | Status: AC
  Filled 2020-07-07: qty 5

## 2020-07-07 MED ORDER — SCOPOLAMINE 1 MG/3DAYS TD PT72
1.0000 | MEDICATED_PATCH | TRANSDERMAL | Status: DC
  Administered 2020-07-07: 1.5 mg via TRANSDERMAL

## 2020-07-07 MED ORDER — ACETAMINOPHEN 500 MG PO TABS: 1000.0000 mg | ORAL_TABLET | Freq: Once | ORAL | Status: DC

## 2020-07-07 MED ORDER — MIDAZOLAM HCL 2 MG/2ML IJ SOLN
INTRAMUSCULAR | Status: AC
  Filled 2020-07-07: qty 2

## 2020-07-07 MED ORDER — PROPOFOL 10 MG/ML IV BOLUS
INTRAVENOUS | Status: DC | PRN
  Administered 2020-07-07: 110 mg via INTRAVENOUS

## 2020-07-07 MED ORDER — KETOROLAC TROMETHAMINE 30 MG/ML IJ SOLN
INTRAMUSCULAR | Status: AC
  Filled 2020-07-07: qty 1

## 2020-07-07 MED ORDER — FENTANYL CITRATE (PF) 100 MCG/2ML IJ SOLN
INTRAMUSCULAR | Status: AC
  Filled 2020-07-07: qty 2

## 2020-07-07 MED ORDER — DEXAMETHASONE SODIUM PHOSPHATE 10 MG/ML IJ SOLN
INTRAMUSCULAR | Status: AC
  Filled 2020-07-07: qty 1

## 2020-07-07 MED ORDER — MIDAZOLAM HCL 5 MG/5ML IJ SOLN
INTRAMUSCULAR | Status: DC | PRN
  Administered 2020-07-07: 2 mg via INTRAVENOUS

## 2020-07-07 MED ORDER — LIDOCAINE 2% (20 MG/ML) 5 ML SYRINGE
INTRAMUSCULAR | Status: DC | PRN
  Administered 2020-07-07: 50 mg via INTRAVENOUS

## 2020-07-07 MED ORDER — ALBUTEROL SULFATE HFA 108 (90 BASE) MCG/ACT IN AERS
INHALATION_SPRAY | RESPIRATORY_TRACT | Status: DC | PRN
  Administered 2020-07-07: 4 via RESPIRATORY_TRACT

## 2020-07-07 MED ORDER — OXYCODONE HCL 5 MG/5ML PO SOLN
5.0000 mg | Freq: Once | ORAL | Status: DC | PRN
Start: 2020-07-07 — End: 2020-07-07

## 2020-07-07 MED ORDER — SCOPOLAMINE 1 MG/3DAYS TD PT72
MEDICATED_PATCH | TRANSDERMAL | Status: AC
  Filled 2020-07-07: qty 1

## 2020-07-07 MED ORDER — ONDANSETRON HCL 4 MG/2ML IJ SOLN
INTRAMUSCULAR | Status: AC
  Filled 2020-07-07: qty 2

## 2020-07-07 MED ORDER — DEXAMETHASONE SODIUM PHOSPHATE 10 MG/ML IJ SOLN
INTRAMUSCULAR | Status: DC | PRN
  Administered 2020-07-07: 5 mg via INTRAVENOUS

## 2020-07-07 MED ORDER — FENTANYL CITRATE (PF) 100 MCG/2ML IJ SOLN
25.0000 ug | INTRAMUSCULAR | Status: DC | PRN
Start: 2020-07-07 — End: 2020-07-07

## 2020-07-07 MED ORDER — IOHEXOL 300 MG/ML  SOLN
INTRAMUSCULAR | Status: DC | PRN
  Administered 2020-07-07: 8 mL via URETHRAL

## 2020-07-07 MED ORDER — PROMETHAZINE HCL 25 MG/ML IJ SOLN: 6.2500 mg | INTRAMUSCULAR | Status: DC | PRN

## 2020-07-07 MED ORDER — ACETAMINOPHEN 10 MG/ML IV SOLN
1000.0000 mg | Freq: Once | INTRAVENOUS | Status: DC | PRN
Start: 2020-07-07 — End: 2020-07-07

## 2020-07-07 MED ORDER — FENTANYL CITRATE (PF) 100 MCG/2ML IJ SOLN
INTRAMUSCULAR | Status: DC | PRN
  Administered 2020-07-07: 50 ug via INTRAVENOUS

## 2020-07-07 MED ORDER — LACTATED RINGERS IV SOLN: INTRAVENOUS | Status: DC

## 2020-07-07 MED ORDER — ONDANSETRON HCL 4 MG/2ML IJ SOLN
INTRAMUSCULAR | Status: DC | PRN
  Administered 2020-07-07: 4 mg via INTRAVENOUS

## 2020-07-07 MED ORDER — KETOROLAC TROMETHAMINE 30 MG/ML IJ SOLN
30.0000 mg | Freq: Once | INTRAMUSCULAR | Status: AC
  Administered 2020-07-07: 30 mg via INTRAVENOUS

## 2020-07-07 MED ORDER — CIPROFLOXACIN IN D5W 400 MG/200ML IV SOLN
400.0000 mg | INTRAVENOUS | Status: AC
  Administered 2020-07-07: 400 mg via INTRAVENOUS

## 2020-07-07 MED ORDER — SODIUM CHLORIDE 0.9 % IR SOLN
Status: DC | PRN
  Administered 2020-07-07: 3000 mL via INTRAVESICAL

## 2020-07-07 MED ORDER — ALBUTEROL SULFATE HFA 108 (90 BASE) MCG/ACT IN AERS
INHALATION_SPRAY | RESPIRATORY_TRACT | Status: AC
  Filled 2020-07-07: qty 6.7

## 2020-07-07 MED ORDER — ACETAMINOPHEN 500 MG PO TABS
ORAL_TABLET | ORAL | Status: AC
  Filled 2020-07-07: qty 2

## 2020-07-07 MED ORDER — OXYCODONE HCL 5 MG PO TABS: 5.0000 mg | ORAL_TABLET | Freq: Once | ORAL | Status: DC | PRN

## 2020-07-07 SURGICAL SUPPLY — 20 items
BAG DRAIN URO-CYSTO SKYTR STRL (DRAIN) ×3 IMPLANT
BAG DRN UROCATH (DRAIN) ×1
CATH INTERMIT  6FR 70CM (CATHETERS) ×3 IMPLANT
CLOTH BEACON ORANGE TIMEOUT ST (SAFETY) ×3 IMPLANT
FIBER LASER FLEXIVA 365 (UROLOGICAL SUPPLIES) IMPLANT
GLOVE BIO SURGEON STRL SZ7.5 (GLOVE) ×3 IMPLANT
GOWN STRL REUS W/TWL XL LVL3 (GOWN DISPOSABLE) ×3 IMPLANT
GUIDEWIRE STR DUAL SENSOR (WIRE) ×6 IMPLANT
IV NS IRRIG 3000ML ARTHROMATIC (IV SOLUTION) ×3 IMPLANT
KIT TURNOVER CYSTO (KITS) ×3 IMPLANT
MANIFOLD NEPTUNE II (INSTRUMENTS) ×3 IMPLANT
NS IRRIG 500ML POUR BTL (IV SOLUTION) ×3 IMPLANT
PACK CYSTO (CUSTOM PROCEDURE TRAY) ×3 IMPLANT
SHEATH URET ACCESS 12FR/35CM (UROLOGICAL SUPPLIES) ×3 IMPLANT
STENT URET 6FRX24 CONTOUR (STENTS) ×3 IMPLANT
TRACTIP FLEXIVA PULS ID 200XHI (Laser) ×1 IMPLANT
TRACTIP FLEXIVA PULSE ID 200 (Laser) ×3
TUBE CONNECTING 12'X1/4 (SUCTIONS) ×1
TUBE CONNECTING 12X1/4 (SUCTIONS) ×2 IMPLANT
TUBING UROLOGY SET (TUBING) ×3 IMPLANT

## 2020-07-07 NOTE — Anesthesia Preprocedure Evaluation (Addendum)
Anesthesia Evaluation  Patient identified by MRN, date of birth, ID band Patient awake    Reviewed: Allergy & Precautions, NPO status , Patient's Chart, lab work & pertinent test results  History of Anesthesia Complications (+) PONV and history of anesthetic complications  Airway Mallampati: III  TM Distance: >3 FB Neck ROM: Full   Comment: S/p right parotid cancer surgery with radiation therapy. Dental no notable dental hx.    Pulmonary neg pulmonary ROS,    Pulmonary exam normal breath sounds clear to auscultation       Cardiovascular hypertension, Pt. on home beta blockers and Pt. on medications + CAD, + Past MI and +CHF  Normal cardiovascular exam Rhythm:Regular Rate:Normal     Neuro/Psych  Headaches, PSYCHIATRIC DISORDERS Anxiety Depression    GI/Hepatic Neg liver ROS, GERD  Medicated and Controlled,  Endo/Other  negative endocrine ROS  Renal/GU Renal disease     Musculoskeletal  (+) Arthritis ,   Abdominal (+) + obese,   Peds  Hematology HLD   Anesthesia Other Findings LEFT RENAL STONE  Reproductive/Obstetrics                           Anesthesia Physical Anesthesia Plan  ASA: III  Anesthesia Plan: General   Post-op Pain Management:    Induction: Intravenous  PONV Risk Score and Plan: 4 or greater and Ondansetron, Dexamethasone, Midazolam, Treatment may vary due to age or medical condition and Propofol infusion  Airway Management Planned: LMA  Additional Equipment:   Intra-op Plan:   Post-operative Plan: Extubation in OR  Informed Consent: I have reviewed the patients History and Physical, chart, labs and discussed the procedure including the risks, benefits and alternatives for the proposed anesthesia with the patient or authorized representative who has indicated his/her understanding and acceptance.     Dental advisory given  Plan Discussed with:  CRNA  Anesthesia Plan Comments:        Anesthesia Quick Evaluation

## 2020-07-07 NOTE — H&P (Signed)
CC/HPI: Cc: Gross hematuria, dysuria.  HPI:  01/24/2019  Past 2 months, the patient has been having pelvic pain, lower right back pain, and dysuria every time she voids. She also admits to an episode of gross hematuria. She has a history of nephrolithiasis. She states that she has had a diagnosis of interstitial cystitis in the past. She did have pelvic floor physical therapy a couple of years ago. Her symptoms improved up until about 2 months ago.   02/18/2019  Patient underwent a CT IVP that revealed nonobstructing left nephrolithiasis. There was no hydronephrosis bilaterally. No ureteral or renal calculi. No bladder calculi. There were no suspicious renal masses. Patient continues to have dysuria as well as pelvic pain and some back pain. She started cimetidine twice daily for the dysuria and her symptoms have not improved. She also complains about overactive bladder symptoms including urgency, frequency.   06/29/2020  For the past few weeks, the patient has been having left lower back pain. She also has noticed some gross hematuria, urinary frequency, some dysuria. She completed 10 days of Bactrim. She has a history nephrolithiasis. She has some intermittent nausea. No vomiting.     ALLERGIES: Ativan - Respiratory Distress Keflex TABS - Skin Rash, flushing    MEDICATIONS: Aspirin 81 mg tablet,chewable  Lisinopril 2.5 mg tablet  Albuterol Sulfate  Atorvastatin Calcium 1 PO Daily  Calcium + D  Carvedilol 3.125 mg tablet  Hydrocodone-Acetaminophen  Methocarbamol 500 mg tablet  Multivitamin  Naproxen  Nitroglycerin 1 PO Daily     GU PSH: Cysto Uretero Lithotripsy - 2014 Cystoscopy - 02/18/2019 Cystoscopy Insert Stent - 2014, 2014 Locm 300-399Mg /Ml Iodine,1Ml - 02/12/2019, 2017 Ureteroscopic stone removal - 2014       PSH Notes: Dermatological Surgery  Bladder Surgery  Ureteroplasty   NON-GU PSH: Breast Biopsy - 2011 Breast Surgery Procedure - 2014 Diag Laparo Separate Proc -  2011 Laparoscope Proc; Ureter - 2014 Remove Spinal Lamina - 2014 Revise Ureter - 2011     GU PMH: Interstitial Cystitis (w/o hematuria) - 02/18/2019 Urinary Frequency - 02/18/2019 Urinary Urgency - 02/18/2019 Gross hematuria - 01/24/2019 Flank Pain (Acute), Left, Ketorolac 60 mg IM. Ketorolac 10 mg 1 po Q6 hrs prn. Instructed to f/u w/PCP if pain persist may need LS xray. If pain worsens or develops gross hematuria will need CT otherwise f/u in Nov w/Dr. 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  2013-03-12 14:52:43 - Note: Melanoma In Situ Of The Skin  2013-03-12 14:52:43 - Note: Arthritis   NON-GU PMH: Muscle weakness (generalized) - 2017, - 2017, - 2017 Other muscle spasm - 2017, - 2017, - 2017 Cervicalgia - 2017 Chronic idiopathic constipation - 2017 Other lack of coordination - 2017 Encounter for general adult medical examination without abnormal findings, Encounter for preventive health examination - 2014 Hypercalciuria, Hypercalciuria - 2014 Other specified congenital malformations of kidney, Extrarenal pelvis - 2014 Asthma, Asthma - 2014    FAMILY HISTORY: Breast Cancer - Runs In Family, Mother Chronic Obstructive  Pulmonary Disease - Runs In Family Colon Cancer - Runs In Family, Father, Sister Death In The Family Father - Runs In Family Dementia - Runs In Family Diabetes - Father Heart Disease - Runs In Family Hypertension - Runs In Family nephrolithiasis - Grandfather    SOCIAL HISTORY: Marital Status: Married Preferred Language: English; Race: White Current Smoking Status: Patient has never smoked.  Drinks 1 drink per month. Social Drinker.  Drinks 4+ caffeinated drinks per day. Patient's occupation Arts administrator.    REVIEW OF SYSTEMS:    GU Review Female:   Patient reports frequent urination, hard to postpone urination, and burning /pain with urination. Patient denies get up at night to urinate, leakage of urine, stream starts and stops, trouble starting your stream, have to strain to urinate, and being pregnant.  Gastrointestinal (Upper):   Patient reports nausea. Patient denies vomiting and indigestion/ heartburn.  Gastrointestinal (Lower):   Patient denies diarrhea and constipation.  Constitutional:   Patient denies fever, night sweats, weight loss, and fatigue.  Skin:   Patient denies skin rash/ lesion and itching.  Eyes:   Patient denies blurred vision and double vision.  Ears/ Nose/ Throat:   Patient denies sore throat and sinus problems.  Hematologic/Lymphatic:   Patient denies easy bruising and swollen glands.  Cardiovascular:   Patient denies leg swelling and chest pains.  Respiratory:   Patient denies cough and shortness of breath.  Endocrine:   Patient denies excessive thirst.  Musculoskeletal:   Patient reports back pain. Patient denies joint pain.  Neurological:   Patient denies headaches and dizziness.  Psychologic:   Patient denies depression and anxiety.   Notes: hematuria    VITAL SIGNS:      06/29/2020 09:54 AM  Weight 200 lb / 90.72 kg  Height 64 in / 162.56 cm  BP 119/78 mmHg  Heart Rate 79 /min  Temperature 98.0 F / 36.6 C  BMI 34.3 kg/m   MULTI-SYSTEM PHYSICAL EXAMINATION:    Gastrointestinal: No mass, no tenderness, no rigidity, non obese abdomen.     Complexity of Data:  Source Of History:  Patient  Records Review:   Previous Doctor Records, Previous Patient Records  Urine Test Review:   Urinalysis   X-Ray Review: C.T. Abdomen/Pelvis: Reviewed Films. Reviewed Report. Discussed With Patient.     PROCEDURES:         C.T. Urogram - [id number]  Patient has a left renal pelvic stone. Another calcification within the left kidney      Patient confirmed No Neulasta OnPro Device.          KUB - [id number]  A single view of the abdomen is obtained.  radiopacity in the left consistent with the left renal pelvic stone. Another calcification consistent with the other stone      Patient confirmed No Neulasta OnPro Device.           PVR Ultrasound - [id number]  Scanned Volume: 332 cc         Urinalysis w/Scope Dipstick Dipstick Cont'd Micro  Color: Yellow Bilirubin: Neg mg/dL WBC/hpf: 0 - 5/hpf  Appearance: Clear Ketones: Neg mg/dL RBC/hpf: 20 - 40/hpf  Specific Gravity: 1.025 Blood: 3+ ery/uL Bacteria: Few (10-25/hpf)  pH: 5.5 Protein: Trace mg/dL Cystals: NS (Not Seen)  Glucose: Neg mg/dL Urobilinogen: 0.2 mg/dL Casts: NS (Not Seen)    Nitrites: Neg Trichomonas: Not Present    Leukocyte Esterase: Neg leu/uL Mucous: Present      Epithelial Cells: 0 - 5/hpf  Yeast: NS (Not Seen)      Sperm: Not Present    ASSESSMENT:      ICD-10 Details  1 GU:   Flank Pain - R10.84 Undiagnosed New Problem  2   Gross hematuria - R31.0 Undiagnosed New Problem  3   Renal calculus - N20.0 Undiagnosed New Problem   PLAN:           Orders Labs Urine Culture  X-Rays: KUB    C.T. Stone Protocol Without Contrast  X-Ray Notes: History:  Hematuria: Yes/No  Patient to see MD after exam: Yes/No  Previous exam: CT / IVP/ US/ KUB/ None  When:  Where:  Diabetic: Yes/ No  BUN/ Creatine:  Date of last BUN Creatinine:  Weight in pounds:  Allergy- Contrasts/ Shellfish: Yes/ No  Conflicting diabetic meds: Yes/ No  Oral contrast and instructions given to patient:   Prior Authorization #: [id number] valid 06/29/20 thru 08/27/20            Schedule         Document Letter(s):  Created for  Patient: Clinical Summary         Notes:   We discussed the management of urinary stones. These options include observation, ureteroscopy, and shockwave lithotripsy. We discussed which options are relevant to these particular stones. We discussed the natural history of stones as well as the complications of untreated stones and the impact on quality of life without treatment as well as with each of the above listed treatments. We also discussed the efficacy of each treatment in its ability to clear the stone burden. With any of these management options I discussed the signs and symptoms of infection and the need for emergent treatment should these be experienced. For each option we discussed the ability of each procedure to clear the patient of their stone burden.   For observation I described the risks which include but are not limited to silent renal damage, life-threatening infection, need for emergent surgery, failure to pass stone, and pain.   For ureteroscopy I described the risks which include heart attack, stroke, pulmonary embolus, death, bleeding, infection, damage to contiguous structures, positioning injury, ureteral stricture, ureteral avulsion, ureteral injury, need for ureteral stent, inability to perform ureteroscopy, need for an interval procedure, inability to clear stone burden, stent discomfort and pain.   For shockwave lithotripsy I described the risks which include arrhythmia, kidney contusion, kidney hemorrhage, need for transfusion, pain, inability to break up stone, inability to pass stone fragments, Steinstrasse, infection associated with obstructing stones, need for different surgical procedure, need for repeat shockwave lithotripsy.   she would like to proceed with ureteroscopy.   CC: Dr. Livia Snellen   Signed by Link Snuffer, III, M.D. on 06/29/20 at 12:24 PM (EST

## 2020-07-07 NOTE — Op Note (Signed)
Operative Note  Preoperative diagnosis:  1.  Left renal calculus  Postoperative diagnosis: 1.  Left renal calculus  Procedure(s): 1.  Cystoscopy with left retrograde pyelogram, left ureteroscopy with laser lithotripsy, ureteral stent placement  Surgeon: Link Snuffer, MD  Assistants: None  Anesthesia: General  Complications: None immediate  EBL: Minimal  Specimens: 1.  None  Drains/Catheters: 1.  6 x 24 double-J ureteral stent  Intraoperative findings: 1.  Normal urethra and bladder 2.  Left retrograde pyelogram revealed a filling defect in the renal pelvis consistent with known stone.  No other obvious filling defects.  No hydronephrosis.  There were 2 separate stones in the kidney that were fragmented to tiny fragments  Indication: 60 year old female with left renal calculi desired intervention.  Description of procedure:  The patient was identified and consent was obtained.  The patient was taken to the operating room and placed in the supine position.  The patient was placed under general anesthesia.  Perioperative antibiotics were administered.  The patient was placed in dorsal lithotomy.  Patient was prepped and draped in a standard sterile fashion and a timeout was performed.  A 21 French rigid cystoscope was advanced into the urethra and into the bladder.  Complete cystoscopy was performed with no abnormal findings.  The left ureter was cannulated with a open-ended ureteral catheter and a retrograde pyelogram was performed.  A sensor wire was advanced up to the kidney under fluoroscopic guidance.  A second wire was advanced alongside this and into the kidney.  The scope was withdrawn.  One of the wires was secured to the drape.  The other wire was used to advance a 12 x 14 ureteral access sheath over the wire under continuous fluoroscopic guidance.  The inner sheath and wire were withdrawn.  Flexible ureteroscopy was performed and the stones were encountered which were laser  fragmented to tiny fragments.  No other stone fragments were seen.  I then withdrew the scope along with the access sheath.  No obvious ureteral injury was noted.  No ureteral calculi.  I backloaded the wire onto a rigid cystoscope and advanced into the bladder followed by routine placement of a 6 x 24 double-J ureteral stent.  Fluoroscopy confirmed proximal placement and direct visualization confirmed a good coil within the bladder.  I drained the bladder and withdrew the scope.  Patient tolerated procedure well and was stable postop.  Plan: Follow-up in 1 week for stent removal

## 2020-07-07 NOTE — Anesthesia Postprocedure Evaluation (Signed)
Anesthesia Post Note  Patient: [name]  Procedure(s) Performed: CYSTOSCOPY LEFT URETEROSCOPY/HOLMIUM LASER/STENT PLACEMENT (Left Ureter)     Patient location during evaluation: PACU Anesthesia Type: General Level of consciousness: awake Pain management: pain level controlled Vital Signs Assessment: post-procedure vital signs reviewed and stable Respiratory status: spontaneous breathing, nonlabored ventilation, respiratory function stable and patient connected to nasal cannula oxygen Cardiovascular status: blood pressure returned to baseline and stable Postop Assessment: no apparent nausea or vomiting Anesthetic complications: no   No complications documented.  Last Vitals:  Vitals:   07/07/20 1300 07/07/20 1415  BP: 136/73 135/73  Pulse: 69 71  Resp: 12 14  Temp: 36.4 C 36.6 C  SpO2: 96% 95%    Last Pain:  Vitals:   07/07/20 1415  TempSrc:   PainSc: 5                  Cortlynn Hollinsworth P Alazar Cherian

## 2020-07-07 NOTE — Interval H&P Note (Signed)
History and Physical Interval Note:  07/07/2020 11:31 AM  [name]  has presented today for surgery, with the diagnosis of LEFT RENAL STONE.  The various methods of treatment have been discussed with the patient and family. After consideration of risks, benefits and other options for treatment, the patient has consented to  Procedure(s): CYSTOSCOPY LEFT URETEROSCOPY/HOLMIUM LASER/STENT PLACEMENT (Left) as a surgical intervention.  The patient's history has been reviewed, patient examined, no change in status, stable for surgery.  I have reviewed the patient's chart and labs.  Questions were answered to the patient's satisfaction.     Marton Redwood, III

## 2020-07-07 NOTE — Discharge Instructions (Addendum)
[facility] [phone] Post Ureteroscopy With or Without Stent Instructions  Definitions:  Ureter: The duct that transports urine from the kidney to the bladder. Stent:   A plastic hollow tube that is placed into the ureter, from the kidney to the bladder to prevent the ureter from swelling shut.  Follow-up in one week for stent removal.   GENERAL INSTRUCTIONS:  Despite the fact that no skin incisions were used, the area around the ureter and bladder is raw and irritated. The stent is a foreign body which will further irritate the bladder wall. This irritation is manifested by increased frequency of urination, both day and night, and by an increase in the urge to urinate. In some, the urge to urinate is present almost always. Sometimes the urge is strong enough that you may not be able to stop yourself from urinating. The only real cure is to remove the stent and then give time for the bladder wall to heal which can't be done until the danger of the ureter swelling shut has passed, which varies.  You may see some blood in your urine while the stent is in place and a few days afterwards. Do not be alarmed, even if the urine was clear for a while. Get off your feet and drink lots of fluids until clearing occurs. If you start to pass clots or don't improve, call us.  DIET: You may return to your normal diet immediately. Because of the raw surface of your bladder, alcohol, spicy foods, acid type foods and drinks with caffeine may cause irritation or frequency and should be used in moderation. To keep your urine flowing freely and to avoid constipation, drink plenty of fluids during the day ( 8-10 glasses ). Tip: Avoid cranberry juice because it is very acidic.  ACTIVITY: Your physical activity doesn't need to be restricted. However, if you are very active, you may see some blood in your urine. We suggest that you reduce your activity under these circumstances until the bleeding  has stopped.  BOWELS: It is important to keep your bowels regular during the postoperative period. Straining with bowel movements can cause bleeding. A bowel movement every other day is reasonable. Use a mild laxative if needed, such as Milk of Magnesia 2-3 tablespoons, or 2 Dulcolax tablets. Call if you continue to have problems. If you have been taking narcotics for pain, before, during or after your surgery, you may be constipated. Take a laxative if necessary.   MEDICATION: You should resume your pre-surgery medications unless told not to. You may take oxybutynin or flomax if prescribed for bladder spasms or discomfort from the stent Take pain medication as directed for pain refractory to conservative management  PROBLEMS YOU SHOULD REPORT TO [place]:  Fevers over 100.5 Fahrenheit.  Heavy bleeding, or clots ( See above notes about blood in urine ).  Inability to urinate.  Drug reactions ( hives, rash, nausea, vomiting, diarrhea ).  Severe burning or pain with urination that is not improving.   Post Anesthesia Home Care Instructions  Activity: Get plenty of rest for the remainder of the day. A responsible individual must stay with you for 24 hours following the procedure.  For the next 24 hours, DO NOT: -Drive a car -Paediatric nurse -Drink alcoholic beverages -Take any medication unless instructed by your physician -Make any legal decisions or sign important papers.  Meals: Start with liquid foods such as gelatin or soup. Progress to regular foods as tolerated. Avoid greasy, spicy, heavy foods.  If nausea and/or vomiting occur, drink only clear liquids until the nausea and/or vomiting subsides. Call your physician if vomiting continues.  Special Instructions/Symptoms: Your throat may feel dry or sore from the anesthesia or the breathing tube placed in your throat during surgery. If this causes discomfort, gargle with warm salt water. The discomfort should disappear within 24  hours.  If you had a scopolamine patch placed behind your ear for the management of post- operative nausea and/or vomiting:  1. The medication in the patch is effective for 72 hours, after which it should be removed.  Wrap patch in a tissue and discard in the trash. Wash hands thoroughly with soap and water. 2. You may remove the patch earlier than 72 hours if you experience unpleasant side effects which may include dry mouth, dizziness or visual disturbances. 3. Avoid touching the patch. Wash your hands with soap and water after contact with the patch.    Remove patch behind Left ear on or before Saturday, July 10, 2020.  No ibuprofen, Advil, Aleve, Motrin, or naproxen until after 6:30 pm today if needed.

## 2020-07-07 NOTE — Transfer of Care (Signed)
Immediate Anesthesia Transfer of Care Note  Patient: [name]  Procedure(s) Performed: CYSTOSCOPY LEFT URETEROSCOPY/HOLMIUM LASER/STENT PLACEMENT (Left Ureter)  Patient Location: PACU  Anesthesia Type:General  Level of Consciousness: drowsy  Airway & Oxygen Therapy: Patient Spontanous Breathing and Patient connected to nasal cannula oxygen  Post-op Assessment: Report given to RN  Post vital signs: Reviewed and stable  Last Vitals:  Vitals Value Taken Time  BP 170/77 07/07/20 1224  Temp    Pulse 74 07/07/20 1225  Resp 12 07/07/20 1225  SpO2 100 % 07/07/20 1225  Vitals shown include unvalidated device data.  Last Pain:  Vitals:   07/07/20 1003  TempSrc: Oral         Complications: No complications documented.

## 2020-07-07 NOTE — Anesthesia Procedure Notes (Signed)
Procedure Name: LMA Insertion Date/Time: 07/07/2020 11:40 AM Performed by: Bonney Aid, CRNA Pre-anesthesia Checklist: Patient identified, Emergency Drugs available, Suction available and Patient being monitored Patient Re-evaluated:Patient Re-evaluated prior to induction Oxygen Delivery Method: Circle system utilized Preoxygenation: Pre-oxygenation with 100% oxygen Induction Type: IV induction Ventilation: Mask ventilation without difficulty LMA: LMA inserted LMA Size: 4.0 Number of attempts: 1 Airway Equipment and Method: Bite block Placement Confirmation: positive ETCO2 Tube secured with: Tape Dental Injury: Teeth and Oropharynx as per pre-operative assessment

## 2020-07-08 ENCOUNTER — Encounter (HOSPITAL_BASED_OUTPATIENT_CLINIC_OR_DEPARTMENT_OTHER): Payer: Self-pay | Admitting: Urology

## 2020-08-10 ENCOUNTER — Encounter: Payer: Self-pay | Admitting: Nurse Practitioner

## 2020-08-10 ENCOUNTER — Other Ambulatory Visit: Payer: Self-pay

## 2020-08-10 ENCOUNTER — Ambulatory Visit: Payer: BC Managed Care – PPO | Admitting: Nurse Practitioner

## 2020-08-10 VITALS — BP 117/66 | HR 91 | Temp 96.8°F | Ht 64.0 in | Wt 194.8 lb

## 2020-08-10 DIAGNOSIS — M5440 Lumbago with sciatica, unspecified side: Secondary | ICD-10-CM

## 2020-08-10 DIAGNOSIS — G8929 Other chronic pain: Secondary | ICD-10-CM | POA: Insufficient documentation

## 2020-08-10 DIAGNOSIS — R300 Dysuria: Secondary | ICD-10-CM | POA: Insufficient documentation

## 2020-08-10 LAB — URINALYSIS, COMPLETE
Bilirubin, UA: NEGATIVE
Glucose, UA: NEGATIVE
Ketones, UA: NEGATIVE
Leukocytes,UA: NEGATIVE
Nitrite, UA: NEGATIVE
Protein,UA: NEGATIVE
Specific Gravity, UA: 1.02 (ref 1.005–1.030)
Urobilinogen, Ur: 0.2 mg/dL (ref 0.2–1.0)
pH, UA: 5 (ref 5.0–7.5)

## 2020-08-10 LAB — MICROSCOPIC EXAMINATION: Bacteria, UA: NONE SEEN

## 2020-08-10 MED ORDER — KETOROLAC TROMETHAMINE 30 MG/ML IJ SOLN
30.0000 mg | Freq: Once | INTRAMUSCULAR | Status: AC
  Administered 2020-08-10: 30 mg via INTRAMUSCULAR

## 2020-08-10 MED ORDER — DICLOFENAC SODIUM 75 MG PO TBEC: 75.0000 mg | DELAYED_RELEASE_TABLET | Freq: Two times a day (BID) | ORAL | 0 refills | Status: DC

## 2020-08-10 MED ORDER — NITROFURANTOIN MONOHYD MACRO 100 MG PO CAPS: 100.0000 mg | ORAL_CAPSULE | Freq: Two times a day (BID) | ORAL | 0 refills | Status: DC

## 2020-08-10 NOTE — Assessment & Plan Note (Signed)
Symptoms not well controlled, this is not new for patient.  Continue Voltaren gel, Toradol injection given in office.  Follow-up for worsening unresolved symptoms.

## 2020-08-10 NOTE — Progress Notes (Signed)
Acute Office Visit  Subjective:    Patient ID: [name], female    DOB: 1961-04-11, 60 y.o.   MRN: [id number]  Chief Complaint  Patient presents with  . Dysuria    Dysuria  This is a new problem. The current episode started in the past 7 days. The problem occurs every urination. The problem has been unchanged. The quality of the pain is described as aching. The pain is moderate. There has been no fever. There is no history of pyelonephritis. Associated symptoms include urgency. Pertinent negatives include no chills, discharge, flank pain, hematuria, nausea or vomiting. She has tried nothing for the symptoms.    Past Medical History:  Diagnosis Date  . Anxiety   . Arthritis    back (12/30/2014)  . Cerebral cyst    per brain MRI 07-12-2019 unchanged 45mm cyst inferomedial right frontal lobe  . Chronic headaches   . Chronic systolic (congestive) heart failure ([place])    followed by dr Frances Nickels  . Complication of anesthesia    pt is very sensitive to benzodiazepines, pt stated "almost died"  . Compression fracture of lumbar vertebra ([place]) 05/27/2020   per pt L3 and L4  . DCM (dilated cardiomyopathy) ([place])    w/ Takatsubo---  followed by dr Johnsie Cancel---  stress-induced --- cardiac cath 12-30-2014 ef 30-35%, recovered per cardiac MRI 03-15-2015 ef  64%  . Depression   . Disorder of mastoid    per brain MRI 07-12-2019 persistant large mastoid effusion  . Dysuria   . Environmental and seasonal allergies   . Frequency of urination   . GERD (gastroesophageal reflux disease)   . History of asthma    childhood  . History of esophageal spasm   . History of kidney stones   . History of melanoma excision 2001   right supraorbital (right forehead and upper eyelid )  s/p  Moh's procedure w/ sln bx,  no recurrence per pt  . History of non-ST elevation myocardial infarction (NSTEMI) 12/29/2014   per cath normal coronaries , ef 30-35%---  stress-- induced Takotsubo syndrome   . History of  palpitations 01/05/2015   STRESS INDUCED  . 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 Hyperlipidemia   . Hypertension   . Kidney cysts   . Left ureteral calculus   . Low back pain   . Muscle spasm of back   . PONV (postoperative nausea and vomiting)   . Takotsubo syndrome 12/29/2014   cardiologist---  dr Frances Nickels--- dx NSTEMI -- stress-induced w/ DCM--  per cath 12-30-2014 normal coronaries , ef 30-35%,  recovered ef per cardiac MRI  ef 64%  . Urgency of urination   . Wears glasses     Past Surgical History:  Procedure Laterality Date  . BREAST BIOPSY Bilateral early 2000's  . CARDIAC CATHETERIZATION  2006 (APPROX)  [place]   NORMAL  . CARDIAC CATHETERIZATION  12/30/2014  . CARDIAC CATHETERIZATION N/A 12/30/2014   Procedure: Left Heart Cath and Coronary Angiography;  Surgeon: Wellington Hampshire, MD;  Location: [facility];  Service: Cardiovascular;  Laterality: N/A;  . CARDIOVASCULAR STRESS TEST  03-21-2012  DR Johnsie Cancel   NORMAL NUCLEAR STUDY/  NO ISCHEMIA/  EF 63%  . COLONOSCOPY WITH PROPOFOL  05/29/2017  . CYSTOSCOPY  W/ URETERAL STENT PLACEMENT Left 03/26/2013   Procedure: CYSTOSCOPY WITH RETROGRADE PYELOGRAM ;  Surgeon: Molli Hazard, MD;  Location[facility];  Service: Urology;  Laterality: Left;  . CYSTOSCOPY WITH RETROGRADE PYELOGRAM, URETEROSCOPY AND STENT PLACEMENT Bilateral 03/19/2013   Procedure: CYSTOSCOPY WITH RETROGRADE PYELOGRAM, BILATERAL URETEROSCOPY AND STENT PLACEMENT LEFT URETER,BILATERAL STONE EXTRACTION , HOLMIUM LASER LEFT URETER;  Surgeon: Molli Hazard, MD;  Location: [facility];  Service: Urology;  Laterality: Bilateral;  . CYSTOSCOPY WITH STENT PLACEMENT Left 03/26/2013   Procedure: CYSTOSCOPY WITH STENT PLACEMENT;   Surgeon: Molli Hazard, MD;  Location: [facility];  Service: Urology;  Laterality: Left;  . CYSTOSCOPY/URETEROSCOPY/HOLMIUM LASER/STENT PLACEMENT Left 07/07/2020   Procedure: CYSTOSCOPY LEFT URETEROSCOPY/HOLMIUM LASER/STENT PLACEMENT;  Surgeon: Lucas Mallow, MD;  Location: [facility]  Service: Urology;  Laterality: Left;  . DIAGNOSTIC LAPAROSCOPY  04-12-2009  . ESOPHAGOGASTRODUODENOSCOPY (EGD) WITH PROPOFOL N/A 02/10/2016   Procedure: ESOPHAGOGASTRODUODENOSCOPY (EGD) WITH PROPOFOL;  Surgeon: Ronald Lobo, MD;  Location: [facility];  Service: Endoscopy;  Laterality: N/A;  . KIDNEY SURGERY  1966   BILATERAL URETER'S DILATATION  . LUMBAR LAMINECTOMY/DECOMPRESSION MICRODISCECTOMY  05/18/2011   Procedure: LUMBAR LAMINECTOMY/DECOMPRESSION MICRODISCECTOMY;  Surgeon: Johnn Hai;  Location: [facility];  Service: Orthopedics;  Laterality: Right;  Decompression Lumbar 4-Lumbar 5  Right    (xray)   . MELANOMA EXCISION WITH SENTINEL LYMPH NODE BIOPSY  2001   moh's procedure/  RIGHT FOREHEAD AND UPPER EYEBROW  . RIGHT LATERAL PAROTIDECTOMY W/ NERVE DISSECTION / RIGHT MODIFIED RADICAL NECK DISSECTION SPARING SCM ELEVENTH NERVE AND INTERNAL JUGULAR VEIN  09-12-2006  DR DWIGHT BATES   DR DWIGHT BATES; "inside gland; lots of lymph nodes"    Family History  Problem Relation Age of Onset  . Hypertension Mother   . COPD Mother   . Cancer Mother        breast  . Dementia Mother   . Heart disease Father   . Cancer Father        Colorectal  . Hyperlipidemia Father   . Hypertension Father     Social History   Socioeconomic History  . Marital status: Married    Spouse name: Not on file  . Number of children: Not on file  . Years of education: Not on file  . Highest education level: Not on file  Occupational History  . Occupation: Scientist, water quality at [employer]  . Smoking status: Never Smoker  . Smokeless tobacco: Never Used  Vaping Use  . Vaping  Use: Never used  Substance and Sexual Activity  . Alcohol use: Not Currently    Comment: very rare  . Drug use: Never  . Sexual activity: Not on file  Other Topics Concern  . Not on file  Social History Narrative  . Not on file   Social Determinants of Health   Financial Resource Strain: Not on file  Food Insecurity: Not on file  Transportation Needs: Not on file  Physical Activity: Not on file  Stress: Not on file  Social Connections: Not on file  Intimate Partner Violence: Not on file    Outpatient Medications Prior to Visit  Medication Sig Dispense Refill  . albuterol (PROVENTIL HFA;VENTOLIN HFA) 108 (90 Base) MCG/ACT inhaler Inhale 2 puffs into the lungs every 6 (six) hours as needed for wheezing or shortness of breath. 1 Inhaler 0  . aspirin 81 MG chewable tablet Chew 81 mg by mouth in the morning.     Marland Kitchen  aspirin-acetaminophen-caffeine (EXCEDRIN MIGRAINE) 250-250-65 MG tablet Take 2 tablets by mouth every 6 (six) hours as needed for headache.    Marland Kitchen atorvastatin (LIPITOR) 20 MG tablet Take 1 tablet (20 mg total) by mouth daily. (Patient taking differently: Take 20 mg by mouth at bedtime.) 90 tablet 3  . Calcium Citrate-Vitamin D (CITRACAL + D PO) Take 1 tablet by mouth in the morning.    . carvedilol (COREG) 3.125 MG tablet TAKE 1 TABLET BY MOUTH TWICE DAILY WITH A MEAL 180 tablet 3  . docusate sodium (COLACE) 100 MG capsule Take 200 mg by mouth daily as needed for mild constipation.    Marland Kitchen HYDROcodone-acetaminophen (NORCO/VICODIN) 5-325 MG tablet Take 1 tablet by mouth every 6 (six) hours as needed for moderate pain.    Marland Kitchen lisinopril (ZESTRIL) 2.5 MG tablet Take 1 tablet (2.5 mg total) by mouth every morning. 90 tablet 3  . loratadine (CLARITIN) 10 MG tablet Take 10 mg by mouth daily.    . Multiple Vitamin (MULTIVITAMIN) tablet Take 1 tablet by mouth in the morning.     . Multiple Vitamins-Minerals (ZINC PO) Take by mouth daily.    . nitroGLYCERIN (NITROSTAT) 0.4 MG SL tablet  Place 1 tablet (0.4 mg total) under the tongue every 5 (five) minutes as needed for chest pain. DISSOLVE ONE TABLET UNDER THE TONGUE EVERY 5 MINUTES AS NEEDED FOR CHEST PAIN.  DO NOT EXCEED A TOTAL OF 3 DOSES IN 15 MINUTES 25 tablet 3  . pantoprazole (PROTONIX) 40 MG tablet Take 40 mg by mouth 2 (two) times daily.     No facility-administered medications prior to visit.    Allergies  Allergen Reactions  . Cephalexin Shortness Of Breath and Rash  . Lorazepam Shortness Of Breath and Other (See Comments)    She may be very sensitive to benzo.        Review of Systems  Constitutional: Negative.  Negative for chills.  HENT: Negative.   Respiratory: Negative.   Cardiovascular: Negative.   Gastrointestinal: Negative.  Negative for nausea and vomiting.  Genitourinary: Positive for dysuria and urgency. Negative for flank pain and hematuria.  Musculoskeletal: Negative.   Skin: Negative.   All other systems reviewed and are negative.      Objective:    Physical Exam Vitals reviewed.  Constitutional:      Appearance: Normal appearance.  HENT:     Head: Normocephalic.     Nose: Nose normal.  Eyes:     Conjunctiva/sclera: Conjunctivae normal.  Cardiovascular:     Rate and Rhythm: Normal rate and regular rhythm.     Pulses: Normal pulses.     Heart sounds: Normal heart sounds.  Pulmonary:     Effort: Pulmonary effort is normal.     Breath sounds: Normal breath sounds.  Abdominal:     General: Bowel sounds are normal.  Musculoskeletal:        General: Tenderness present.  Skin:    General: Skin is warm.  Neurological:     Mental Status: She is alert and oriented to person, place, and time.  Psychiatric:        Behavior: Behavior normal.     BP 117/66   Pulse 91   Temp (!) 96.8 F (36 C)   Ht 5\' 4"  (1.626 m)   Wt 194 lb 12.8 oz (88.4 kg)   LMP 05/18/2011   SpO2 95%   BMI 33.44 kg/m  Wt Readings from Last 3 Encounters:  08/10/20 194 lb 12.8 oz (  88.4 kg)  07/07/20  202 lb 6.4 oz (91.8 kg)  06/02/20 198 lb (89.8 kg)    Health Maintenance Due  Topic Date Due  . PAP SMEAR-Modifier  06/09/2018  . COVID-19 Vaccine (3 - Moderna risk 4-dose series) 10/10/2019  . MAMMOGRAM  10/26/2019      Lab Results  Component Value Date   TSH 2.360 07/05/2017   Lab Results  Component Value Date   WBC 7.0 08/20/2019   HGB 11.6 (L) 07/07/2020   HCT 34.0 (L) 07/07/2020   MCV 97.9 08/20/2019   PLT 170 08/20/2019   Lab Results  Component Value Date   NA 142 07/07/2020   K 4.2 07/07/2020   CO2 27 08/20/2019   GLUCOSE 94 07/07/2020   BUN 19 07/07/2020   CREATININE 0.80 07/07/2020   BILITOT 0.2 (L) 10/07/2018   ALKPHOS 51 10/07/2018   AST 27 10/07/2018   ALT 28 10/07/2018   PROT 7.1 10/07/2018   ALBUMIN 4.4 10/07/2018   CALCIUM 9.3 08/20/2019   ANIONGAP 9 08/20/2019   Lab Results  Component Value Date   CHOL 146 01/03/2018   Lab Results  Component Value Date   HDL 50 01/03/2018   Lab Results  Component Value Date   LDLCALC 76 01/03/2018   Lab Results  Component Value Date   TRIG 101 01/03/2018   Lab Results  Component Value Date   CHOLHDL 2.9 01/03/2018   Lab Results  Component Value Date   HGBA1C 5.5 01/03/2017       Assessment & Plan:   Problem List Items Addressed This Visit      Nervous and Auditory   Chronic midline low back pain with sciatica    Symptoms not well controlled, this is not new for patient.  Continue Voltaren gel, Toradol injection given in office.  Follow-up for worsening unresolved symptoms.      Relevant Medications   diclofenac (VOLTAREN) 75 MG EC tablet     Other   Dysuria - Primary    Worsening dysuria symptoms.  Completed urinalysis/urine culture results pending.  Started patient on Macrobid 100 mg twice daily for 7 days.  Rx sent to pharmacy Please follow-up with unresolved symptoms Education provided to patient with printed handouts given.      Relevant Medications   diclofenac (VOLTAREN)  75 MG EC tablet   nitrofurantoin, macrocrystal-monohydrate, (MACROBID) 100 MG capsule   Other Relevant Orders   Urinalysis, Complete (Completed)   Urine Culture       Meds ordered this encounter  Medications  . diclofenac (VOLTAREN) 75 MG EC tablet    Sig: Take 1 tablet (75 mg total) by mouth 2 (two) times daily.    Dispense:  30 tablet    Refill:  0    Order Specific Question:   Supervising Provider    Answer:   Janora Norlander [[id number]]  . ketorolac (TORADOL) 30 MG/ML injection 30 mg  . nitrofurantoin, macrocrystal-monohydrate, (MACROBID) 100 MG capsule    Sig: Take 1 capsule (100 mg total) by mouth 2 (two) times daily. 1 po BId    Dispense:  14 capsule    Refill:  0    Order Specific Question:   Supervising Provider    Answer:   Janora Norlander [[id number]]     Ivy Lynn, NP

## 2020-08-10 NOTE — Patient Instructions (Addendum)
[email or web address]://[email or web address]">  Chronic Back Pain When back pain lasts longer than 3 months, it is called chronic back pain. The cause of your back pain may not be known. Some common causes include:  Wear and tear (degenerative disease) of the bones, ligaments, or disks in your back.  Inflammation and stiffness in your back (arthritis). People who have chronic back pain often go through certain periods in which the pain is more intense (flare-ups). Many people can learn to manage the pain with home care. Follow these instructions at home: Pay attention to any changes in your symptoms. Take these actions to help with your pain: Managing pain and stiffness  If directed, apply ice to the painful area. Your health care provider may recommend applying ice during the first 24-48 hours after a flare-up begins. To do this: ? Put ice in a plastic bag. ? Place a towel between your skin and the bag. ? Leave the ice on for 20 minutes, 2-3 times per day.  If directed, apply heat to the affected area as often as told by your health care provider. Use the heat source that your health care provider recommends, such as a moist heat pack or a heating pad. ? Place a towel between your skin and the heat source. ? Leave the heat on for 20-30 minutes. ? Remove the heat if your skin turns bright red. This is especially important if you are unable to feel pain, heat, or cold. You may have a greater risk of getting burned.  Try soaking in a warm tub.      Activity  Avoid bending and other activities that make the problem worse.  Maintain a proper position when standing or sitting: ? When standing, keep your upper back and neck straight, with your shoulders pulled back. Avoid slouching. ? When sitting, keep your back straight and relax your shoulders. Do not round your shoulders or pull them backward.  Do not sit or stand in one place for long periods of time.  Take brief periods of rest  throughout the day. This will reduce your pain. Resting in a lying or standing position is usually better than sitting to rest.  When you are resting for longer periods, mix in some mild activity or stretching between periods of rest. This will help to prevent stiffness and pain.  Get regular exercise. Ask your health care provider what activities are safe for you.  Do not lift anything that is heavier than 10 lb (4.5 kg), or the limit that you are told, until your health care provider says that it is safe. Always use proper lifting technique, which includes: ? Bending your knees. ? Keeping the load close to your body. ? Avoiding twisting.  Sleep on a firm mattress in a comfortable position. Try lying on your side with your knees slightly bent. If you lie on your back, put a pillow under your knees.   Medicines  Treatment may include medicines for pain and inflammation taken by mouth or applied to the skin, prescription pain medicine, or muscle relaxants. Take over-the-counter and prescription medicines only as told by your health care provider.  Ask your health care provider if the medicine prescribed to you: ? Requires you to avoid driving or using machinery. ? Can cause constipation. You may need to take these actions to prevent or treat constipation:  Drink enough fluid to keep your urine pale yellow.  Take over-the-counter or prescription medicines.  Eat foods that are high in fiber, such as   beans, whole grains, and fresh fruits and vegetables.  Limit foods that are high in fat and processed sugars, such as fried or sweet foods. General instructions  Do not use any products that contain nicotine or tobacco, such as cigarettes, e-cigarettes, and chewing tobacco. If you need help quitting, ask your health care provider.  Keep all follow-up visits as told by your health care provider. This is important. Contact a health care provider if:  You have pain that is not relieved with  rest or medicine.  Your pain gets worse, or you have new pain.  You have a high fever.  You have rapid weight loss.  You have trouble doing your normal activities. Get help right away if:  You have weakness or numbness in one or both of your legs or feet.  You have trouble controlling your bladder or your bowels.  You have severe back pain and have any of the following: ? Nausea or vomiting. ? Pain in your abdomen. ? Shortness of breath or you faint. Summary  Chronic back pain is back pain that lasts longer than 3 months.  When a flare-up begins, apply ice to the painful area for the first 24-48 hours.  Apply a moist heat pad or use a heating pad on the painful area as directed by your health care provider.  When you are resting for longer periods, mix in some mild activity or stretching between periods of rest. This will help to prevent stiffness and pain. This information is not intended to replace advice given to you by your health care provider. Make sure you discuss any questions you have with your health care provider. Document Revised: 07/16/2019 Document Reviewed: 07/16/2019 Elsevier Patient Education  2021 [place]. Dysuria Dysuria is pain or discomfort during urination. The pain or discomfort may be felt in the part of the body that drains urine from the bladder (urethra) or in the surrounding tissue of the genitals. The pain may also be felt in the groin area, lower abdomen, or lower back. You may have to urinate frequently or have the sudden feeling that you have to urinate (urgency). Dysuria can affect anyone, but it is more common in females. Dysuria can be caused by many different things, including:  Urinary tract infection.  Kidney stones or bladder stones.  Certain STIs (sexually transmitted infections), such as chlamydia.  Dehydration.  Inflammation of the tissues of the vagina.  Use of certain medicines.  Use of certain soaps or scented products  that cause irritation. Follow these instructions at home: Medicines  Take over-the-counter and prescription medicines only as told by your health care provider.  If you were prescribed an antibiotic medicine, take it as told by your health care provider. Do not stop taking the antibiotic even if you start to feel better. Eating and drinking  Drink enough fluid to keep your urine pale yellow.  Avoid caffeinated beverages, tea, and alcohol. These beverages can irritate the bladder and make dysuria worse. In males, alcohol may irritate the prostate.   General instructions  Watch your condition for any changes.  Urinate often. Avoid holding urine for long periods of time.  If you are female, you should wipe from front to back after urinating or having a bowel movement. Use each piece of toilet paper only once.  Empty your bladder after sex.  Keep all follow-up visits. This is important.  If you had any tests done to find the cause of dysuria, it is up to you  to get your test results. Ask your health care provider, or the department that is doing the test, when your results will be ready. Contact a health care provider if:  You have a fever.  You develop pain in your back or sides.  You have nausea or vomiting.  You have blood in your urine.  You are not urinating as often as you usually do. Get help right away if:  Your pain is severe and not relieved with medicines.  You cannot eat or drink without vomiting.  You are confused.  You have a rapid heartbeat while resting.  You have shaking or chills.  You feel extremely weak. Summary  Dysuria is pain or discomfort while urinating. Many different conditions can lead to dysuria.  If you have dysuria, you may have to urinate frequently or have the sudden feeling that you have to urinate (urgency).  Watch your condition for any changes. Keep all follow-up visits.  Make sure that you urinate often and drink enough fluid  to keep your urine pale yellow. This information is not intended to replace advice given to you by your health care provider. Make sure you discuss any questions you have with your health care provider. Document Revised: 01/16/2020 Document Reviewed: 01/16/2020 Elsevier Patient Education  [place].

## 2020-08-10 NOTE — Assessment & Plan Note (Signed)
Worsening dysuria symptoms.  Completed urinalysis/urine culture results pending.  Started patient on Macrobid 100 mg twice daily for 7 days.  Rx sent to pharmacy Please follow-up with unresolved symptoms Education provided to patient with printed handouts given.

## 2020-08-11 LAB — URINE CULTURE: Organism ID, Bacteria: NO GROWTH

## 2020-09-13 ENCOUNTER — Other Ambulatory Visit: Payer: Self-pay | Admitting: Physician Assistant

## 2020-09-17 ENCOUNTER — Other Ambulatory Visit: Payer: Self-pay | Admitting: Physician Assistant

## 2020-09-17 DIAGNOSIS — R1011 Right upper quadrant pain: Secondary | ICD-10-CM

## 2020-10-01 ENCOUNTER — Other Ambulatory Visit: Payer: Self-pay | Admitting: Physician Assistant

## 2020-10-01 DIAGNOSIS — R109 Unspecified abdominal pain: Secondary | ICD-10-CM

## 2020-10-06 ENCOUNTER — Ambulatory Visit
Admission: RE | Admit: 2020-10-06 | Discharge: 2020-10-06 | Disposition: A | Discharge status: Home / Self Care | Payer: BC Managed Care – PPO | Source: Ambulatory Visit | Attending: Physician Assistant | Admitting: Physician Assistant

## 2020-10-06 DIAGNOSIS — R109 Unspecified abdominal pain: Secondary | ICD-10-CM

## 2020-10-07 ENCOUNTER — Other Ambulatory Visit (HOSPITAL_COMMUNITY): Payer: Self-pay | Admitting: Physician Assistant

## 2020-10-07 DIAGNOSIS — R1011 Right upper quadrant pain: Secondary | ICD-10-CM

## 2020-10-07 DIAGNOSIS — R932 Abnormal findings on diagnostic imaging of liver and biliary tract: Secondary | ICD-10-CM

## 2020-10-17 HISTORY — PX: COLONOSCOPY: SHX174

## 2020-10-27 ENCOUNTER — Ambulatory Visit (HOSPITAL_COMMUNITY)
Admission: RE | Admit: 2020-10-27 | Discharge: 2020-10-27 | Disposition: A | Discharge status: Home / Self Care | Payer: BC Managed Care – PPO | Source: Ambulatory Visit | Attending: Physician Assistant | Admitting: Physician Assistant

## 2020-10-27 ENCOUNTER — Other Ambulatory Visit: Payer: Self-pay

## 2020-10-27 DIAGNOSIS — R932 Abnormal findings on diagnostic imaging of liver and biliary tract: Secondary | ICD-10-CM | POA: Insufficient documentation

## 2020-10-27 DIAGNOSIS — R1011 Right upper quadrant pain: Secondary | ICD-10-CM | POA: Diagnosis present

## 2020-10-27 MED ORDER — TECHNETIUM TC 99M MEBROFENIN IV KIT
5.4000 | PACK | Freq: Once | INTRAVENOUS | Status: AC
  Administered 2020-10-27: 5.4 via INTRAVENOUS

## 2020-12-02 ENCOUNTER — Ambulatory Visit (INDEPENDENT_AMBULATORY_CARE_PROVIDER_SITE_OTHER): Payer: BC Managed Care – PPO | Admitting: Family Medicine

## 2020-12-02 ENCOUNTER — Ambulatory Visit: Payer: BC Managed Care – PPO | Admitting: Family Medicine

## 2020-12-02 ENCOUNTER — Other Ambulatory Visit: Payer: Self-pay

## 2020-12-02 ENCOUNTER — Encounter: Payer: Self-pay | Admitting: Family Medicine

## 2020-12-02 VITALS — BP 126/76 | HR 74 | Ht 64.0 in | Wt 193.0 lb

## 2020-12-02 DIAGNOSIS — I10 Essential (primary) hypertension: Secondary | ICD-10-CM

## 2020-12-02 DIAGNOSIS — K219 Gastro-esophageal reflux disease without esophagitis: Secondary | ICD-10-CM

## 2020-12-02 DIAGNOSIS — E782 Mixed hyperlipidemia: Secondary | ICD-10-CM | POA: Diagnosis not present

## 2020-12-02 NOTE — Progress Notes (Signed)
BP 126/76   Pulse 74   Ht _0  (1.626 m)   Wt 193 lb (87.5 kg)   LMP 05/18/2011   SpO2 95%   BMI 33.13 kg/m    Subjective:   Patient ID: [name], female    DOB: [birth year], 60 y.o.   MRN: [id number]  HPI: [name] is a 60 y.o. female presenting on 12/02/2020 for Medical Management of Chronic Issues and Hyperlipidemia   HPI Hyperlipidemia Patient is coming in for recheck of his hyperlipidemia. The patient is currently taking Lipitor. They deny any issues with myalgias or history of liver damage from it. They deny any focal numbness or weakness or chest pain.   Hypertension Patient is currently on lisinopril and carvedilol, and their blood pressure today is 126/76. Patient denies any lightheadedness or dizziness. Patient denies headaches, blurred vision, chest pains, shortness of breath, or weakness. Denies any side effects from medication and is content with current medication.   GERD Patient is currently on pantoprazole.  She denies any major symptoms or abdominal pain or belching or burping. She denies any blood in her stool or lightheadedness or dizziness.   Relevant past medical, surgical, family and social history reviewed and updated as indicated. Interim medical history since our last visit reviewed. Allergies and medications reviewed and updated.  Review of Systems  Constitutional:  Negative for chills and fever.  Eyes:  Negative for redness and visual disturbance.  Respiratory:  Negative for chest tightness and shortness of breath.   Cardiovascular:  Negative for chest pain and leg swelling.  Genitourinary:  Negative for difficulty urinating and dysuria.  Musculoskeletal:  Positive for back pain (Working with a spinal doctor). Negative for gait problem.  Skin:  Negative for rash.  Neurological:  Negative for light-headedness and headaches.  Psychiatric/Behavioral:  Negative for agitation and behavioral problems.   All other systems reviewed and are  negative.  Per HPI unless specifically indicated above   Allergies as of 12/02/2020       Reactions   Cephalexin Shortness Of Breath, Rash   Lorazepam Shortness Of Breath, Other (See Comments)   She may be very sensitive to benzo.           Medication List        Accurate as of December 02, 2020 11:11 AM. If you have any questions, ask your nurse or doctor.          STOP taking these medications    diclofenac 75 MG EC tablet Commonly known as: VOLTAREN Stopped by: Fransisca Kaufmann Kang Ishida, MD   nitrofurantoin (macrocrystal-monohydrate) 100 MG capsule Commonly known as: Macrobid Stopped by: Worthy Rancher, MD       TAKE these medications    albuterol 108 (90 Base) MCG/ACT inhaler Commonly known as: VENTOLIN HFA Inhale 2 puffs into the lungs every 6 (six) hours as needed for wheezing or shortness of breath.   aspirin 81 MG chewable tablet Chew 81 mg by mouth in the morning.   aspirin-acetaminophen-caffeine 250-250-65 MG tablet Commonly known as: EXCEDRIN MIGRAINE Take 2 tablets by mouth every 6 (six) hours as needed for headache.   atorvastatin 20 MG tablet Commonly known as: LIPITOR Take 1 tablet (20 mg total) by mouth daily. What changed: when to take this   carvedilol 3.125 MG tablet Commonly known as: COREG TAKE 1 TABLET BY MOUTH TWICE DAILY WITH A MEAL   CITRACAL + D PO Take 1 tablet by mouth in the morning.  docusate sodium 100 MG capsule Commonly known as: COLACE Take 200 mg by mouth daily as needed for mild constipation.   HYDROcodone-acetaminophen 5-325 MG tablet Commonly known as: NORCO/VICODIN Take 1 tablet by mouth every 6 (six) hours as needed for moderate pain.   lisinopril 2.5 MG tablet Commonly known as: ZESTRIL Take 1 tablet (2.5 mg total) by mouth every morning.   loratadine 10 MG tablet Commonly known as: CLARITIN Take 10 mg by mouth daily.   multivitamin tablet Take 1 tablet by mouth in the morning.   nitroGLYCERIN 0.4 MG  SL tablet Commonly known as: NITROSTAT Place 1 tablet (0.4 mg total) under the tongue every 5 (five) minutes as needed for chest pain. DISSOLVE ONE TABLET UNDER THE TONGUE EVERY 5 MINUTES AS NEEDED FOR CHEST PAIN.  DO NOT EXCEED A TOTAL OF 3 DOSES IN 15 MINUTES   pantoprazole 40 MG tablet Commonly known as: PROTONIX Take 40 mg by mouth 2 (two) times daily.   ZINC PO Take by mouth daily.         Objective:   BP 126/76   Pulse 74   Ht _0  (1.626 m)   Wt 193 lb (87.5 kg)   LMP 05/18/2011   SpO2 95%   BMI 33.13 kg/m   Wt Readings from Last 3 Encounters:  12/02/20 193 lb (87.5 kg)  08/10/20 194 lb 12.8 oz (88.4 kg)  07/07/20 202 lb 6.4 oz (91.8 kg)    Physical Exam Vitals and nursing note reviewed.  Constitutional:      General: She is not in acute distress.    Appearance: She is well-developed. She is not diaphoretic.  Eyes:     Conjunctiva/sclera: Conjunctivae normal.  Cardiovascular:     Rate and Rhythm: Normal rate and regular rhythm.     Heart sounds: Normal heart sounds. No murmur heard. Pulmonary:     Effort: Pulmonary effort is normal. No respiratory distress.     Breath sounds: Normal breath sounds. No wheezing.  Musculoskeletal:        General: No swelling.  Skin:    General: Skin is warm and dry.     Findings: No rash.  Neurological:     Mental Status: She is alert and oriented to person, place, and time.     Coordination: Coordination normal.  Psychiatric:        Behavior: Behavior normal.      Assessment & Plan:   Problem List Items Addressed This Visit       Cardiovascular and Mediastinum   Essential (primary) hypertension (Chronic)   Relevant Orders   CMP14+EGFR     Digestive   GERD (gastroesophageal reflux disease)   Relevant Orders   CBC with Differential/Platelet     Other   Hyperlipidemia - Primary (Chronic)   Relevant Orders   Lipid panel    Continue current medication, seems to be doing well we will see what blood work  is. Follow up plan: Return in about 6 months (around 06/03/2021), or if symptoms worsen or fail to improve, for Hypertension cholesterol.  Counseling provided for all of the vaccine components Orders Placed This Encounter  Procedures   CBC with Differential/Platelet   CMP14+EGFR   Lipid panel    Caryl Pina, MD [facility] 12/02/2020, 11:11 AM

## 2020-12-03 LAB — CMP14+EGFR
ALT: 27 IU/L (ref 0–32)
AST: 30 IU/L (ref 0–40)
Albumin/Globulin Ratio: 2.1 (ref 1.2–2.2)
Albumin: 4.8 g/dL (ref 3.8–4.8)
Alkaline Phosphatase: 86 IU/L (ref 44–121)
BUN/Creatinine Ratio: 21 (ref 12–28)
BUN: 17 mg/dL (ref 8–27)
Bilirubin Total: 0.2 mg/dL (ref 0.0–1.2)
CO2: 22 mmol/L (ref 20–29)
Calcium: 9.4 mg/dL (ref 8.7–10.3)
Chloride: 105 mmol/L (ref 96–106)
Creatinine, Ser: 0.81 mg/dL (ref 0.57–1.00)
Globulin, Total: 2.3 g/dL (ref 1.5–4.5)
Glucose: 99 mg/dL (ref 65–99)
Potassium: 4.5 mmol/L (ref 3.5–5.2)
Sodium: 142 mmol/L (ref 134–144)
Total Protein: 7.1 g/dL (ref 6.0–8.5)
eGFR: 82 mL/min/{1.73_m2} (ref >59)

## 2020-12-03 LAB — CBC WITH DIFFERENTIAL/PLATELET
Basophils Absolute: 0 10*3/uL (ref 0.0–0.2)
Basos: 0 %
EOS (ABSOLUTE): 0.1 10*3/uL (ref 0.0–0.4)
Eos: 3 %
Hematocrit: 39.9 % (ref 34.0–46.6)
Hemoglobin: 13.4 g/dL (ref 11.1–15.9)
Immature Grans (Abs): 0 10*3/uL (ref 0.0–0.1)
Immature Granulocytes: 0 %
Lymphocytes Absolute: 1.2 10*3/uL (ref 0.7–3.1)
Lymphs: 29 %
MCH: 31.4 pg (ref 26.6–33.0)
MCHC: 33.6 g/dL (ref 31.5–35.7)
MCV: 93 fL (ref 79–97)
Monocytes Absolute: 0.4 10*3/uL (ref 0.1–0.9)
Monocytes: 8 %
Neutrophils Absolute: 2.6 10*3/uL (ref 1.4–7.0)
Neutrophils: 60 %
Platelets: 175 10*3/uL (ref 150–450)
RBC: 4.27 x10E6/uL (ref 3.77–5.28)
RDW: 12.5 % (ref 11.7–15.4)
WBC: 4.3 10*3/uL (ref 3.4–10.8)

## 2020-12-03 LAB — LIPID PANEL
Chol/HDL Ratio: 3.1 ratio (ref 0.0–4.4)
Cholesterol, Total: 182 mg/dL (ref 100–199)
HDL: 58 mg/dL (ref >39)
LDL Chol Calc (NIH): 105 mg/dL — ABNORMAL HIGH (ref 0–99)
Triglycerides: 108 mg/dL (ref 0–149)
VLDL Cholesterol Cal: 19 mg/dL (ref 5–40)

## 2020-12-10 NOTE — Progress Notes (Signed)
Pt returned missed call. Reviewed lab results with pt per Dr Neldon Mc note. Pt voiced understanding.

## 2021-01-06 DIAGNOSIS — Z8589 Personal history of malignant neoplasm of other organs and systems: Secondary | ICD-10-CM | POA: Insufficient documentation

## 2021-01-11 NOTE — Progress Notes (Signed)
CARDIOLOGY OFFICE NOTE  Date:  01/17/2021    [name] Date of Birth: 12/03/1960 Medical Record [id number]  PCP:  Dettinger, Fransisca Kaufmann, MD  Cardiologist:  Johnsie Cancel   No chief complaint on file.   History of Present Illness: [name] is a 60 y.o. female who presents for f/u Takatsubo DCM, Abnormal ECG, labile BP and chest pain   She has a history of chronically abnormal EKG. Remote normal cath in [facility] many years ago. Admitted in 2016 with chest pain - found to have Tkatsubo with EF 45% - no CAD by cath - MRI in 02/2015 showed normal EF. Hospital visit in 06/2015 for chest pain with negative evaluation.   Cardiac CT done 05/15/17 normal with calcium score 0  Seen in [place] for SSCP 07/06/18 R/O normal CXR thought to have gas Reviewed All records Troponin negative x 2   Has anxiety and depression   Seen in ER 08/20/19 for chest pain right sided ? Similar to esophageal pain with spasm ECG and troponins normal Told to f/u with GI   Now Working 5 days/week in [place]   Husband also a patient of mine and she gets overwhelmed at times with his health   Going to [place] for [employer] event with her husband He is a founder of large group and known nationally     Past Medical History:  Diagnosis Date   Anxiety    Arthritis    back (12/30/2014)   Cerebral cyst    per brain MRI 07-12-2019 unchanged 31m cyst inferomedial right frontal lobe   Chronic headaches    Chronic systolic [place]congestive) heart failure [place]    followed by dr nFrances Nickels  Complication of anesthesia    pt is very sensitive to benzodiazepines, pt stated "almost died"   [place] 05/[place]   per pt L3 and L4   D[place]    w/ Takatsubo---  followed by dr nJohnsie Cancel--  stress-induced --- cardiac cath 12-30-2014 ef 30-35%, recovered per cardiac MRI 03-15-2015 ef  64%   Depression    Disorder of mastoid    per brain MRI  07-12-2019 persistant large mastoid effusion   Dysuria    Environmental and seasonal allergies    Frequency of urination    GERD (gastroesophageal reflux disease)    History of asthma    childhood   History of esophageal spasm    History of kidney stones    History of melanoma excision 2001   right supraorbital (right forehead and upper eyelid )  s/p  Moh's procedure w/ sln bx,  no recurrence per pt   History of non-ST elevation myocardial infarction (NSTEMI) 12/29/2014   per cath normal coronaries , ef 30-35%---  stress-- induced Takotsubo syndrome    History of palpitations 01/05/2015   STRESS INDUCED   History of parotid cancer 2008   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-2006 s/p  right lateral parotidectomy w/ nerve dissection , modified radical neck dissection ;  completed  x35 fractions raditation 2008;  no recurrence per pt   Hyperlipidemia    Hypertension    Kidney cysts    Left ureteral calculus    Low back pain    Muscle spasm of back    PONV (postoperative nausea and vomiting)    Takotsubo syndrome 12/29/2014   cardiologist---  dr nFrances Nickels-- dx NSTEMI -- stress-induced w/ DCM--  per cath 12-30-2014  normal coronaries , ef 30-35%,  recovered ef per cardiac MRI  ef 64%   Urgency of urination    Wears glasses     Past Surgical History:  Procedure Laterality Date   BREAST BIOPSY Bilateral early 2000's   CARDIAC CATHETERIZATION  2006 (APPROX)  [place]   NORMAL   CARDIAC CATHETERIZATION  12/30/2014   CARDIAC CATHETERIZATION N/A 12/30/2014   Procedure: Left Heart Cath and Coronary Angiography;  Surgeon: Wellington Hampshire, MD;  Location: [facility];  Service: Cardiovascular;  Laterality: N/A;   CARDIOVASCULAR STRESS TEST  03-21-2012  DR Johnsie Cancel   NORMAL NUCLEAR STUDY/  NO ISCHEMIA/  EF 63%   COLONOSCOPY WITH PROPOFOL  05/29/2017   CYSTOSCOPY W/ URETERAL STENT PLACEMENT Left 03/26/2013   Procedure: CYSTOSCOPY WITH RETROGRADE PYELOGRAM ;   Surgeon: Molli Hazard, MD;  Location: [facility];  Service: Urology;  Laterality: Left;   CYSTOSCOPY WITH RETROGRADE PYELOGRAM, URETEROSCOPY AND STENT PLACEMENT Bilateral 03/19/2013   Procedure: CYSTOSCOPY WITH RETROGRADE PYELOGRAM, BILATERAL URETEROSCOPY AND STENT PLACEMENT LEFT URETER,BILATERAL STONE EXTRACTION , HOLMIUM LASER LEFT URETER;  Surgeon: Molli Hazard, MD;  Location: [facility];  Service: Urology;  Laterality: Bilateral;   CYSTOSCOPY WITH STENT PLACEMENT Left 03/26/2013   Procedure: CYSTOSCOPY WITH STENT PLACEMENT;  Surgeon: Molli Hazard, MD;  [facility];  Service: Urology;  Laterality: Left;   CYSTOSCOPY/URETEROSCOPY/HOLMIUM LASER/STENT PLACEMENT Left 07/07/2020   Procedure: CYSTOSCOPY LEFT URETEROSCOPY/HOLMIUM LASER/STENT PLACEMENT;  Surgeon: Lucas Mallow, MD;  [facility]: [facility];  Service: Urology;  Laterality: Left;   DIAGNOSTIC LAPAROSCOPY  04-12-2009   ESOPHAGOGASTRODUODENOSCOPY (EGD) WITH PROPOFOL N/A 02/10/2016   Procedure: ESOPHAGOGASTRODUODENOSCOPY (EGD) WITH PROPOFOL;  Surgeon: Ronald Lobo, MD;  Location: [facility];  Service: Endoscopy;  Laterality: N/A;   KIDNEY SURGERY  1966   BILATERAL URETER'S DILATATION   LUMBAR LAMINECTOMY/DECOMPRESSION MICRODISCECTOMY  05/18/2011   Procedure: LUMBAR LAMINECTOMY/DECOMPRESSION MICRODISCECTOMY;  Surgeon: Johnn Hai;  Location: [facility];  Service: Orthopedics;  Laterality: Right;  Decompression Lumbar 4-Lumbar 5  Right    (xray)    MELANOMA EXCISION WITH SENTINEL LYMPH NODE BIOPSY  2001   moh's procedure/  RIGHT FOREHEAD AND UPPER EYEBROW   RIGHT LATERAL PAROTIDECTOMY W/ NERVE DISSECTION / RIGHT MODIFIED RADICAL NECK DISSECTION SPARING SCM ELEVENTH NERVE AND INTERNAL JUGULAR VEIN  09-12-2006  DR DWIGHT BATES   DR DWIGHT BATES; "inside gland; lots of lymph nodes"     Medications: Current Meds  Medication Sig   albuterol (PROVENTIL  HFA;VENTOLIN HFA) 108 (90 Base) MCG/ACT inhaler Inhale 2 puffs into the lungs every 6 (six) hours as needed for wheezing or shortness of breath.   aspirin-acetaminophen-caffeine (EXCEDRIN MIGRAINE) 250-250-65 MG tablet Take 2 tablets by mouth every 6 (six) hours as needed for headache.   atorvastatin (LIPITOR) 20 MG tablet Take 1 tablet (20 mg total) by mouth daily.   carvedilol (COREG) 3.125 MG tablet TAKE 1 TABLET BY MOUTH TWICE DAILY WITH A MEAL   docusate sodium (COLACE) 100 MG capsule Take 200 mg by mouth daily as needed for mild constipation.   HYDROcodone-acetaminophen (NORCO/VICODIN) 5-325 MG tablet Take 1 tablet by mouth every 6 (six) hours as needed for moderate pain.   lisinopril (ZESTRIL) 2.5 MG tablet Take 1 tablet (2.5 mg total) by mouth every morning.   loratadine (CLARITIN) 10 MG tablet Take 10 mg by mouth daily.   nitroGLYCERIN (NITROSTAT) 0.4 MG SL tablet Place 1 tablet (0.4 mg total) under  the tongue every 5 (five) minutes as needed for chest pain. DISSOLVE ONE TABLET UNDER THE TONGUE EVERY 5 MINUTES AS NEEDED FOR CHEST PAIN.  DO NOT EXCEED A TOTAL OF 3 DOSES IN 15 MINUTES   pantoprazole (PROTONIX) 40 MG tablet Take 40 mg by mouth 2 (two) times daily.   [DISCONTINUED] aspirin 81 MG chewable tablet Chew 81 mg by mouth in the morning.    [DISCONTINUED] Calcium Citrate-Vitamin D (CITRACAL + D PO) Take 1 tablet by mouth in the morning.   [DISCONTINUED] Multiple Vitamin (MULTIVITAMIN) tablet Take 1 tablet by mouth in the morning.    [DISCONTINUED] Multiple Vitamins-Minerals (ZINC PO) Take by mouth daily.     Allergies: Allergies  Allergen Reactions   Cephalexin Shortness Of Breath and Rash   Lorazepam Shortness Of Breath and Other (See Comments)    She may be very sensitive to benzo.        Social History: The patient  reports that she has never smoked. She has never used smokeless tobacco. She reports previous alcohol use. She reports that she does not use drugs.   Family  History: The patient's family history includes COPD in her mother; Cancer in her father and mother; Dementia in her mother; Heart disease in her father; Hyperlipidemia in her father; Hypertension in her father and mother.   Review of Systems: Please see the history of present illness.   Otherwise, the review of systems is positive for none.   All other systems are reviewed and negative.   Physical Exam: VS:  BP 110/64   Pulse 71   Ht '5\' 4"'$  (1.626 m)   Wt 86.5 kg   LMP 05/18/2011   SpO2 97%   BMI 32.75 kg/m  .  BMI Body mass index is 32.75 kg/m.  Wt Readings from Last 3 Encounters:  01/17/21 86.5 kg  12/02/20 87.5 kg  08/10/20 88.4 kg   Affect appropriate Healthy:  appears stated age 34: normal Neck supple with no adenopathy JVP normal no bruits no thyromegaly Lungs clear with no wheezing and good diaphragmatic motion Heart:  S1/S2 no murmur, no rub, gallop or click PMI normal Abdomen: benighn, BS positve, no tenderness, no AAA no bruit.  No HSM or HJR Distal pulses intact with no bruits No edema Neuro non-focal Skin warm and dry No muscular weakness    LABORATORY DATA:  EKG:  03/28/17  NSR with anterior T wave changes chronic .  [facility] Results  Component Value Date   WBC 4.3 12/02/2020   HGB 13.4 12/02/2020   HCT 39.9 12/02/2020   PLT 175 12/02/2020   GLUCOSE 99 12/02/2020   CHOL 182 12/02/2020   TRIG 108 12/02/2020   HDL 58 12/02/2020   LDLCALC 105 (H) 12/02/2020   ALT 27 12/02/2020   AST 30 12/02/2020   NA 142 12/02/2020   K 4.5 12/02/2020   CL 105 12/02/2020   CREATININE 0.81 12/02/2020   BUN 17 12/02/2020   CO2 22 12/02/2020   TSH 2.360 07/05/2017   INR 1.10 12/30/2014   HGBA1C 5.5 01/03/2017     BNP (last 3 results) No results for input(s): BNP in the last 8760 hours.  ProBNP (last 3 results) No results for input(s): PROBNP in the last 8760 hours.   Other Studies Reviewed Today:  Cardiac MRI IMPRESSION 02/2015: 1) Normal LV size and  function   2) Quantitative EF 64% no RWMA;s   3) No scar tissue or hyperenhancement   Overall findings consistent with  Takatsubo DCM   Electronically Signed   By: Jenkins Rouge M.D.   On: 03/18/2015 16:52   Echo Study Conclusions from 12/2014   - Left ventricle: The cavity size was normal. Systolic function was   mildly reduced. The estimated ejection fraction was 45%. Diffuse   hypokinesis. Doppler parameters are consistent with abnormal left   ventricular relaxation (grade 1 diastolic dysfunction). There was   no evidence of elevated ventricular filling pressure by Doppler   parameters. - Aortic valve: Trileaflet; normal thickness leaflets. There was   trivial regurgitation. - Aortic root: The aortic root was normal in size. - Mitral valve: Structurally normal valve. There was no   regurgitation. - Left atrium: The atrium was normal in size. - Right ventricle: Systolic function was normal. - Right atrium: The atrium was normal in size. - Tricuspid valve: There was trivial regurgitation. - Pulmonic valve: There was no regurgitation. - Pulmonary arteries: Systolic pressure was within the normal   range. - Inferior vena cava: The vessel was normal in size. - Pericardium, extracardiac: There was no pericardial effusion.    Procedures   Left Heart Cath and Coronary Angiography 12/2014  Conclusion   1. Normal coronary arteries. 2. Moderately to severely reduced LV systolic function with an ejection fraction of 30-35% with severe hypokinesis of the mid distal anterior, apical and mid to distal inferior walls consistent with stress-induced cardiomyopathy. 3. Mildly elevated left ventricular end-diastolic pressure.   Recommendations: Recommend a small dose beta blocker and ACE inhibitor. Avoid stress. Monitor for at least another day and possible discharge home tomorrow if she remains stable.     Assessment/Plan:  1. Chest pain: no CAD cath 2016    Normal cardiac CT with  calcium score 0 05/15/17 non cardiac pain Observe  History of esophageal spasm f/u GI   2. HTN - improved continue current meds   3. Takotsubo DCM  - EF 64% recovered by MRI 03/18/15 . She has no symptoms of CHF. Would keep on her current regimen for now.  At risk for recurrence given current anxiety and depression   4. HLD:  on statin LDL 105 acceptable with calcium score 0   5. GERD:  low carb diet continue pantoprazole     F/U in a year   Jenkins Rouge

## 2021-01-17 ENCOUNTER — Ambulatory Visit (INDEPENDENT_AMBULATORY_CARE_PROVIDER_SITE_OTHER): Payer: BC Managed Care – PPO | Admitting: Cardiovascular Disease

## 2021-01-17 ENCOUNTER — Other Ambulatory Visit: Payer: Self-pay

## 2021-01-17 ENCOUNTER — Encounter: Payer: Self-pay | Admitting: Cardiovascular Disease

## 2021-01-17 VITALS — BP 110/64 | HR 71 | Ht 64.0 in | Wt 190.8 lb

## 2021-01-17 DIAGNOSIS — R079 Chest pain, unspecified: Secondary | ICD-10-CM

## 2021-01-17 DIAGNOSIS — E782 Mixed hyperlipidemia: Secondary | ICD-10-CM | POA: Diagnosis not present

## 2021-01-17 DIAGNOSIS — I10 Essential (primary) hypertension: Secondary | ICD-10-CM

## 2021-01-17 NOTE — Patient Instructions (Signed)

## 2021-02-11 ENCOUNTER — Telehealth: Payer: Self-pay | Admitting: Family Medicine

## 2021-02-11 NOTE — Telephone Encounter (Signed)
At this time we do not have anything available before 03/14/21 for a surgical clearance with Dr. Warrick Parisian.  I spoke with patient's husband and advised and recommended they contact us 2-3 times a week and we will check for cancellations.

## 2021-02-16 ENCOUNTER — Ambulatory Visit (INDEPENDENT_AMBULATORY_CARE_PROVIDER_SITE_OTHER): Payer: BC Managed Care – PPO | Admitting: Family Medicine

## 2021-02-16 ENCOUNTER — Other Ambulatory Visit: Payer: Self-pay

## 2021-02-16 ENCOUNTER — Encounter: Payer: Self-pay | Admitting: Family Medicine

## 2021-02-16 VITALS — BP 107/70 | HR 77 | Temp 97.8°F | Ht 64.0 in | Wt 187.2 lb

## 2021-02-16 DIAGNOSIS — I255 Ischemic cardiomyopathy: Secondary | ICD-10-CM

## 2021-02-16 DIAGNOSIS — Z01818 Encounter for other preprocedural examination: Secondary | ICD-10-CM | POA: Diagnosis not present

## 2021-02-16 DIAGNOSIS — G8929 Other chronic pain: Secondary | ICD-10-CM

## 2021-02-16 DIAGNOSIS — I10 Essential (primary) hypertension: Secondary | ICD-10-CM

## 2021-02-16 DIAGNOSIS — M5440 Lumbago with sciatica, unspecified side: Secondary | ICD-10-CM

## 2021-02-16 NOTE — Progress Notes (Signed)
Pt is a 60 y.o. female who is here for preoperative clearance for lumbar decompression under general anesthesia with Dr. Tonita Cong.   1) High Risk Cardiac Conditions  1) Recent MI - No.  2) Decompensated Heart Failure - No. History of dilated cardiomyopathy.  3) Unstable angina - No.  4) Symptomatic arrythmia - No.  5) Sx Valvular Disease - No.  2) Intermediate Risk Factors - DM, CKD, CVA, CHF, CAD - Yes.     3) Surgery Specific Risk - intermediate  4) Further Noninvasive evaluation - needs to see cardiology for clearance  1) EKG - Yes.   Prior EKG from cardiology office reviewed   1) Hx of CVA, CAD, DM, CKD  2) Echo - No. Needs to see cardiology prior to clearance, last echo 2016.  3) Stress Testing - Active Cardiac Disease - needs to see cardiology for clearance, can determine if stress testing needed.   5) Need for medical therapy - Beta Blocker, Statins indicated ? Yes.    History, medications, family history, and social history reviewed.  Physical Exam: Vitals:   02/16/21 1039  BP: 107/70  Pulse: 77  Temp: 97.8 F (36.6 C)  SpO2: 95%   Physical Exam Vitals and nursing note reviewed.  Constitutional:      General: She is not in acute distress.    Appearance: Normal appearance. She is obese. She is not ill-appearing, toxic-appearing or diaphoretic.  HENT:     Head: Normocephalic and atraumatic.     Nose: Nose normal.     Mouth/Throat:     Mouth: Mucous membranes are moist.     Pharynx: Oropharynx is clear.  Eyes:     Extraocular Movements: Extraocular movements intact.     Pupils: Pupils are equal, round, and reactive to light.  Cardiovascular:     Rate and Rhythm: Normal rate and regular rhythm.     Heart sounds: No murmur heard.   No friction rub. No gallop.  Pulmonary:     Effort: Pulmonary effort is normal. No respiratory distress.     Breath sounds: Normal breath sounds. No stridor. No wheezing, rhonchi or rales.  Chest:     Chest wall: No tenderness.   Abdominal:     General: Bowel sounds are normal. There is no distension.     Palpations: Abdomen is soft.     Tenderness: There is no abdominal tenderness.  Musculoskeletal:     Cervical back: Normal.     Thoracic back: Normal.     Lumbar back: Tenderness present. Decreased range of motion.     Right hip: Normal.     Left hip: Normal.  Skin:    General: Skin is warm and dry.     Capillary Refill: Capillary refill takes less than 2 seconds.  Neurological:     General: No focal deficit present.     Mental Status: She is alert and oriented to person, place, and time.     Cranial Nerves: No cranial nerve deficit.     Sensory: No sensory deficit.     Motor: No weakness.     Coordination: Coordination normal.     Gait: Gait abnormal (antalgic).     Deep Tendon Reflexes: Reflexes normal.  Psychiatric:        Mood and Affect: Mood normal.        Behavior: Behavior normal.        Thought Content: Thought content normal.        Judgment: Judgment normal.  '  Assess and Plan Teague was seen today for surgical clearance .  Diagnoses and all orders for this visit:  Preoperative clearance Needs to see cardiology for cardiac clearance prior to surgery, cleared from a primary care standpoint.   Chronic midline low back pain with sciatica, sciatica laterality unspecified Plan is for lumbar decompression surgery under general anesthesia. Needs cardiac clearance prior to surgery.   Ischemic cardiomyopathy Follow up with cardiology as discussed.   Essential (primary) hypertension Well controlled. Continue current regimen.    I have independently evaluated patient.  SAKENA WEHRS is a 60 y.o. female who is intermediate to high risk for an intermediate risk surgery. She should be evaluated by cardiology for surgical clearance.   Monia Pouch, FNP-C [facility] [address] [place], [place] [address] [phone]

## 2021-02-17 ENCOUNTER — Telehealth: Payer: Self-pay | Admitting: *Deleted

## 2021-02-17 NOTE — Telephone Encounter (Signed)
    Patient Name: LINSI BOTERO  DOB: [birth year] MRN: [id number]  Primary Cardiologist: Jenkins Rouge, MD  Chart reviewed as part of pre-operative protocol coverage. Patient was recently seen by Dr. Johnsie Cancel on 01/24/2021 at which time patient was stable from a cardiac standpoint. She has a history of non-cardiac chest pain. Coronary CTA in 04/2017 showed calcium score of 0 with no evidence of CAD. She was advised to follow-up in 1 year. Given past medical history and time since last visit, based on ACC/AHA guidelines, STORME FESMIRE would be at acceptable risk for the planned procedure without further cardiovascular testing.   I will route this recommendation to the requesting party via Epic fax function and remove from pre-op pool.  Please call with questions.  Darreld Mclean, PA-C 02/17/2021, 1:58 PM

## 2021-02-17 NOTE — Telephone Encounter (Signed)
   [facility] Pre-operative Risk Assessment    Patient Name: [name]  DOB: 1961/05/17 MRN: [id number]  HEARTCARE STAFF:  - IMPORTANT!!!!!! Under Visit Info/Reason for Call, type in Other and utilize the format Clearance MM/DD/YY or Clearance TBD. Do not use dashes or single digits. - Please review there is not already an duplicate clearance open for this procedure. - If request is for dental extraction, please clarify the # of teeth to be extracted. - If the patient is currently at the dentist's office, call Pre-Op Callback Staff (MA/nurse) to input urgent request.  - If the patient is not currently in the dentist office, please route to the Pre-Op pool.  Request for surgical clearance:  What type of surgery is being performed?   MICROLUMBAR DECOMPRESSION L5-S1  When is this surgery scheduled?  TBD  What type of clearance is required (medical clearance vs. Pharmacy clearance to hold med vs. Both)?  MEDICAL  Are there any medications that need to be held prior to surgery and how long?  N/A  Practice name and name of physician performing surgery?  [facility] / DR. Tonita Cong  What is the office phone number?  [phone]   7.   What is the office fax number?  [phone] ATTN:  SHERRI  8.   Anesthesia type (None, local, MAC, general) ?     Jeanann Lewandowsky 02/17/2021, 12:14 PM  _________________________________________________________________   (provider comments below)

## 2021-02-22 ENCOUNTER — Other Ambulatory Visit: Payer: Self-pay

## 2021-02-22 ENCOUNTER — Encounter: Payer: Self-pay | Admitting: Dermatology

## 2021-02-22 ENCOUNTER — Ambulatory Visit (INDEPENDENT_AMBULATORY_CARE_PROVIDER_SITE_OTHER): Payer: BC Managed Care – PPO | Admitting: Dermatology

## 2021-02-22 DIAGNOSIS — L814 Other melanin hyperpigmentation: Secondary | ICD-10-CM | POA: Diagnosis not present

## 2021-02-22 DIAGNOSIS — L570 Actinic keratosis: Secondary | ICD-10-CM | POA: Diagnosis not present

## 2021-02-22 DIAGNOSIS — C4492 Squamous cell carcinoma of skin, unspecified: Secondary | ICD-10-CM

## 2021-02-22 DIAGNOSIS — D0439 Carcinoma in situ of skin of other parts of face: Secondary | ICD-10-CM | POA: Diagnosis not present

## 2021-02-22 DIAGNOSIS — Z1283 Encounter for screening for malignant neoplasm of skin: Secondary | ICD-10-CM | POA: Diagnosis not present

## 2021-02-22 DIAGNOSIS — D485 Neoplasm of uncertain behavior of skin: Secondary | ICD-10-CM

## 2021-02-22 DIAGNOSIS — L249 Irritant contact dermatitis, unspecified cause: Secondary | ICD-10-CM | POA: Diagnosis not present

## 2021-02-22 HISTORY — DX: Squamous cell carcinoma of skin, unspecified: C44.92

## 2021-02-22 MED ORDER — SILVER SULFADIAZINE 1 % EX CREA: 1.0000 "application " | TOPICAL_CREAM | Freq: Every day | CUTANEOUS | 0 refills | Status: DC

## 2021-02-22 NOTE — Patient Instructions (Signed)

## 2021-02-24 ENCOUNTER — Ambulatory Visit: Payer: Self-pay | Admitting: Orthopedic Surgery

## 2021-02-27 ENCOUNTER — Encounter: Payer: Self-pay | Admitting: Dermatology

## 2021-02-27 NOTE — Progress Notes (Addendum)
   Follow-Up Visit   Subjective  [name] is a 60 y.o. female who presents for the following: Annual Exam (Right shoulder- x 10 days- + itch, + bleed , right forehead- x 2 months- red & tender).  Annual skin check, growth on forehead and face, recent severe itching on right inner shoulder. Location:  Duration:  Quality:  Associated Signs/Symptoms: Modifying Factors:  Severity:  Timing: Context:   Objective  Well appearing patient in no apparent distress; mood and affect are within normal limits. Head Full body skin check facial solar lentigo, new crusty lesion near old scar biopsy today.  Plan to go shows no dermoscopic atypia.  Neck Patient stated the worst itch she ever had,right post shoulder only; this apparently has improved.  There is a 5 cm patch with lichenification and excoriations.  I am uncertain what triggered this, local allergy versus neurogenic pruritus.  Right Forehead Subtle 1 cm waxy crust, rule out superficial carcinoma     face      A full examination was performed including scalp, head, eyes, ears, nose, lips, neck, chest, axillae, abdomen, back, buttocks, bilateral upper extremities, bilateral lower extremities, hands, feet, fingers, toes, fingernails, and toenails. All findings within normal limits unless otherwise noted below.  Areas beneath undergarments not fully examined.   Assessment & Plan    Solar lentigo Head  If stable okay to leave clinic (freckle).  Biopsy scaly area  Irritant dermatitis Neck  Topical Silvadene daily after bathing for 2 weeks.  If there is no improvement, biopsy may be indicated.  silver sulfADIAZINE (SILVADENE) 1 % cream - Neck Apply 1 application topically daily. Apply to shoulder nightly 7-14 days  Neoplasm of uncertain behavior of skin Right Forehead  Skin / nail biopsy Type of biopsy: tangential   Informed consent: discussed and consent obtained   Timeout: patient name, date of birth, surgical  site, and procedure verified   Anesthesia: the lesion was anesthetized in a standard fashion   Anesthetic:  1% lidocaine w/ epinephrine 1-100,000 local infiltration Instrument used: flexible razor blade   Hemostasis achieved with: aluminum chloride and electrodesiccation   Outcome: patient tolerated procedure well   Post-procedure details: wound care instructions given    Specimen 1 - Surgical pathology Differential Diagnosis: bcc scc  Check Margins: No  AK (actinic keratosis) face  Destruction of lesion - face Complexity: simple   Destruction method: cryotherapy   Informed consent: discussed and consent obtained   Lesion destroyed using liquid nitrogen: Yes   Cryotherapy cycles:  3 Outcome: patient tolerated procedure well with no complications       I, Lavonna Monarch, MD, have reviewed all documentation for this visit.  The documentation on 03/12/21 for the exam, diagnosis, procedures, and orders are all accurate and complete.

## 2021-02-28 ENCOUNTER — Telehealth: Payer: Self-pay

## 2021-02-28 DIAGNOSIS — Z01419 Encounter for gynecological examination (general) (routine) without abnormal findings: Secondary | ICD-10-CM | POA: Diagnosis not present

## 2021-02-28 DIAGNOSIS — Z1231 Encounter for screening mammogram for malignant neoplasm of breast: Secondary | ICD-10-CM | POA: Diagnosis not present

## 2021-02-28 DIAGNOSIS — Z130 Encounter for screening for diseases of the blood and blood-forming organs and certain disorders involving the immune mechanism: Secondary | ICD-10-CM | POA: Diagnosis not present

## 2021-02-28 DIAGNOSIS — E669 Obesity, unspecified: Secondary | ICD-10-CM | POA: Diagnosis not present

## 2021-02-28 DIAGNOSIS — Z1389 Encounter for screening for other disorder: Secondary | ICD-10-CM | POA: Diagnosis not present

## 2021-02-28 NOTE — Telephone Encounter (Signed)
-----   Message from Lavonna Monarch, MD sent at 02/25/2021  8:19 AM EDT ----- Schedule surgery with Dr. Darene Lamer

## 2021-02-28 NOTE — Telephone Encounter (Signed)
Left message for patient to call and get surgery appointment

## 2021-03-02 ENCOUNTER — Telehealth: Payer: Self-pay | Admitting: *Deleted

## 2021-03-02 NOTE — Telephone Encounter (Signed)
Pathology to patient-surgery appointment scheduled.  

## 2021-03-02 NOTE — Telephone Encounter (Signed)
-----   Message from Lavonna Monarch, MD sent at 02/25/2021  8:19 AM EDT ----- Schedule surgery with Dr. Darene Lamer

## 2021-03-14 ENCOUNTER — Ambulatory Visit: Payer: BC Managed Care – PPO | Admitting: Family Medicine

## 2021-03-23 ENCOUNTER — Ambulatory Visit: Payer: Self-pay | Admitting: Orthopedic Surgery

## 2021-03-23 NOTE — H&P (Signed)
[name] is an 60 y.o. female.   Chief Complaint: back and leg pain HPI: Reason for Visit: low back Context: The patient is 8 1/2 months out Location (Lower Extremity): lower back pain on the right; right buttock and hip Severity: pain level 8/10 Aggravating Factors: sitting for Associated Symptoms: no numbness/tingling; no weakness; cramping bilateral lower legs Medications: The patient was prescribed Percocet. She takes Aleve or Excedrin during the day. Notes: The patient is 8 weeks following a right S1 SNRB Patient reports 2 days of relief of pain. And then her pain returns it radiates down into the right leg outer aspect of the foot. 7 to take pain medicine.  Past Medical History:  Diagnosis Date   Anxiety    Arthritis    back (12/30/2014)   Cerebral cyst    per brain MRI 07-12-2019 unchanged 43mm cyst inferomedial right frontal lobe   Chronic headaches    Chronic systolic (congestive) heart failure ([place])    followed by dr Frances Nickels   Complication of anesthesia    pt is very sensitive to benzodiazepines, pt stated "almost died"   Compression fracture of lumbar vertebra ([place]) 05/27/2020   per pt L3 and L4   DCM (dilated cardiomyopathy) ([place])    w/ Takatsubo---  followed by dr Johnsie Cancel---  stress-induced --- cardiac cath 12-30-2014 ef 30-35%, recovered per cardiac MRI 03-15-2015 ef  64%   Depression    Disorder of mastoid    per brain MRI 07-12-2019 persistant large mastoid effusion   Dysuria    Environmental and seasonal allergies    Frequency of urination    GERD (gastroesophageal reflux disease)    History of asthma    childhood   History of esophageal spasm    History of kidney stones    History of melanoma excision 2001   right supraorbital (right forehead and upper eyelid )  s/p  Moh's procedure w/ sln bx,  no recurrence per pt   History of non-ST elevation myocardial infarction (NSTEMI) 12/29/2014   per cath normal coronaries , ef 30-35%---  stress-- induced  Takotsubo syndrome    History of palpitations 01/05/2015   STRESS INDUCED   History of parotid cancer 2008   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-2006 s/p  right lateral parotidectomy w/ nerve dissection , modified radical neck dissection ;  completed  x35 fractions raditation 2008;  no recurrence per pt   Hyperlipidemia    Hypertension    Kidney cysts    Left ureteral calculus    Low back pain    Melanoma ([place])    14 years ago per patient   Muscle spasm of back    PONV (postoperative nausea and vomiting)    Takotsubo syndrome 12/29/2014   cardiologist---  dr Frances Nickels--- dx NSTEMI -- stress-induced w/ DCM--  per cath 12-30-2014 normal coronaries , ef 30-35%,  recovered ef per cardiac MRI  ef 64%   Urgency of urination    Wears glasses     Past Surgical History:  Procedure Laterality Date   BREAST BIOPSY Bilateral early 2000's   CARDIAC CATHETERIZATION  2006 (APPROX)  [place]   NORMAL   CARDIAC CATHETERIZATION  12/30/2014   CARDIAC CATHETERIZATION N/A 12/30/2014   Procedure: Left Heart Cath and Coronary Angiography;  Surgeon: Wellington Hampshire, MD;  Location: [facility];  Service: Cardiovascular;  Laterality: N/A;   CARDIOVASCULAR STRESS TEST  03-21-2012  DR Johnsie Cancel   NORMAL NUCLEAR STUDY/  NO  ISCHEMIA/  EF 63%   COLONOSCOPY WITH PROPOFOL  05/29/2017   CYSTOSCOPY W/ URETERAL STENT PLACEMENT Left 03/26/2013   Procedure: CYSTOSCOPY WITH RETROGRADE PYELOGRAM ;  Surgeon: Molli Hazard, MD;  Location[facility];  Service: Urology;  Laterality: Left;   CYSTOSCOPY WITH RETROGRADE PYELOGRAM, URETEROSCOPY AND STENT PLACEMENT Bilateral 03/19/2013   Procedure: CYSTOSCOPY WITH RETROGRADE PYELOGRAM, BILATERAL URETEROSCOPY AND STENT PLACEMENT LEFT URETER,BILATERAL STONE EXTRACTION , HOLMIUM LASER LEFT URETER;  Surgeon: Molli Hazard, MD;  Location: [facility];  Service: Urology;  Laterality: Bilateral;   CYSTOSCOPY WITH STENT PLACEMENT  Left 03/26/2013   Procedure: CYSTOSCOPY WITH STENT PLACEMENT;  Surgeon: Molli Hazard, MD;  Location: [facility];  Service: Urology;  Laterality: Left;   CYSTOSCOPY/URETEROSCOPY/HOLMIUM LASER/STENT PLACEMENT Left 07/07/2020   Procedure: CYSTOSCOPY LEFT URETEROSCOPY/HOLMIUM LASER/STENT PLACEMENT;  Surgeon: Lucas Mallow, MD;  Location: [facility];  Service: Urology;  Laterality: Left;   DIAGNOSTIC LAPAROSCOPY  04-12-2009   ESOPHAGOGASTRODUODENOSCOPY (EGD) WITH PROPOFOL N/A 02/10/2016   Procedure: ESOPHAGOGASTRODUODENOSCOPY (EGD) WITH PROPOFOL;  Surgeon: Ronald Lobo, MD;  Location: [facility];  Service: Endoscopy;  Laterality: N/A;   KIDNEY SURGERY  1966   BILATERAL URETER'S DILATATION   LUMBAR LAMINECTOMY/DECOMPRESSION MICRODISCECTOMY  05/18/2011   Procedure: LUMBAR LAMINECTOMY/DECOMPRESSION MICRODISCECTOMY;  Surgeon: Johnn Hai;  Location: [facility];  Service: Orthopedics;  Laterality: Right;  Decompression Lumbar 4-Lumbar 5  Right    (xray)    MELANOMA EXCISION WITH SENTINEL LYMPH NODE BIOPSY  2001   moh's procedure/  RIGHT FOREHEAD AND UPPER EYEBROW   RIGHT LATERAL PAROTIDECTOMY W/ NERVE DISSECTION / RIGHT MODIFIED RADICAL NECK DISSECTION SPARING SCM ELEVENTH NERVE AND INTERNAL JUGULAR VEIN  09-12-2006  DR DWIGHT BATES   DR DWIGHT BATES; "inside gland; lots of lymph nodes"    Family History  Problem Relation Age of Onset   Hypertension Mother    COPD Mother    Cancer Mother        breast   Dementia Mother    Heart disease Father    Cancer Father        Colorectal   Hyperlipidemia Father    Hypertension Father    Social History:  reports that she has never smoked. She has never used smokeless tobacco. She reports that she does not currently use alcohol. She reports that she does not use drugs.  Allergies:  Allergies  Allergen Reactions   Cephalexin Shortness Of Breath and Rash   Lorazepam Shortness Of Breath and Other (See  Comments)    She may be very sensitive to benzo.        Medications: atorvastatin 20 mg tablet carvediloL 3.125 mg tablet Ciprodex 0.3 %-0.1 % ear drops,suspension Lidoderm 5 % topical patch lisinopriL 2.5 mg tablet nitroglycerin 0.4 mg sublingual tablet omeprazole oxyCODONE 5 mg tablet  Review of Systems  Constitutional: Negative.   HENT: Negative.    Eyes: Negative.   Respiratory: Negative.    Cardiovascular: Negative.   Gastrointestinal: Negative.   Endocrine: Negative.   Genitourinary: Negative.   Musculoskeletal:  Positive for back pain, gait problem and myalgias.  Allergic/Immunologic: Negative.   Neurological:  Positive for weakness and numbness.   Last menstrual period 05/18/2011. Physical Exam Constitutional:      Appearance: Normal appearance.  HENT:     Head: Normocephalic.     Right Ear: External ear normal.     Left Ear: External ear normal.     Nose: Nose normal.  Mouth/Throat:     Pharynx: Oropharynx is clear.  Eyes:     Conjunctiva/sclera: Conjunctivae normal.  Cardiovascular:     Rate and Rhythm: Normal rate.     Pulses: Normal pulses.  Pulmonary:     Effort: Pulmonary effort is normal.     Breath sounds: Normal breath sounds.  Abdominal:     General: Bowel sounds are normal.  Musculoskeletal:     Cervical back: Normal range of motion.     Comments: Gait and Station: Appearance: ambulating with no assistive devices and antalgic gait.  Constitutional: General Appearance: healthy-appearing and distress (mild).  Psychiatric: Mood and Affect: active and alert.  Cardiovascular System: Edema Right: none; Dorsalis and posterior tibial pulses 2+. Edema Left: none.  Abdomen: Inspection and Palpation: non-distended and no tenderness.  Skin: Inspection and palpation: no rash.  Lumbar Spine: Inspection: normal alignment. Bony Palpation of the Lumbar Spine: tender at lumbosacral junction.. Bony Palpation of the Right Hip: no tenderness of the  greater trochanter and tenderness of the SI joint; Pelvis stable. Bony Palpation of the Left Hip: no tenderness of the greater trochanter and tenderness of the SI joint. Soft Tissue Palpation on the Right: No flank pain with percussion. Active Range of Motion: limited flexion and extention.  Motor Strength: L1 Motor Strength on the Right: hip flexion iliopsoas 5/5. L1 Motor Strength on the Left: hip flexion iliopsoas 5/5. L2-L4 Motor Strength on the Right: knee extension quadriceps 5/5. L2-L4 Motor Strength on the Left: knee extension quadriceps 5/5. L5 Motor Strength on the Right: ankle dorsiflexion tibialis anterior 5/5 and great toe extension extensor hallucis longus 5/5. L5 Motor Strength on the Left: ankle dorsiflexion tibialis anterior 5/5 and great toe extension extensor hallucis longus 5/5. S1 Motor Strength on the Right: plantar flexion gastrocnemius 5/5. S1 Motor Strength on the Left: plantar flexion gastrocnemius 5/5.  Neurological System: Knee Reflex Right: normal (2). Knee Reflex Left: normal (2). Ankle Reflex Right: normal (2). Ankle Reflex Left: normal (2). Babinski Reflex Right: plantar reflex absent. Babinski Reflex Left: plantar reflex absent. Sensation on the Right: normal distal extremities. Sensation on the Left: normal distal extremities. Special Tests on the Right: no clonus of the ankle/knee and seated straight leg raising test positive. Special Tests on the Left: no clonus of the ankle/knee.  Trace EHL weakness in the right compared to the left.  Skin:    General: Skin is warm and dry.  Neurological:     Mental Status: She is alert.    X-rays of the lumbar spine demonstrate a mild levorotatory scoliosis. Osteopenia. Disc degeneration L5-S1. Degenerative changes L3-4 L2-3 and the lower thoracic segments. L3-L4 compression fracture without change.  MRI demonstrated in March a subacute fracture at L3 and possibly L4. 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 Temporary relief from 2 epidural steroid injections at L5-S1. History of lumbar decompression  Plan:  We discussed options that include living with her symptoms with activity modification pain management etc. I do not believe any further epidurals will be of therapeutic benefit giving the temporary relief from 2 epidurals on the right L4-5  Other option would be to perform a decompression at L5-S1. Unilaterally or bilateral.  She would like to proceed with that.  I had an  extensive discussion with the patient concerning the pathology relevant anatomy and treatment options. At this point exhausting conservative treatment and in the presence of a neurologic deficit we discussed microlumbar decompression. I discussed the risks and benefits including bleeding, infection, DVT, PE, anesthetic complications, worsening in their symptoms, improvement in their symptoms, C SF leakage, epidural fibrosis, need for future surgeries such as revision discectomy and lumbar fusion. I also indicated that this is an operation to basically decompress the nerve root to allow recovery as opposed to fixing a herniated disc if iy is encountered and that the incidence of recurrent chest disc herniation can approach 15%. Also that nerve root recovery is variable and may not recover completely.  I discussed the operative course including overnight in the hospital. Immediate ambulation. Follow-up in 2 weeks for suture removal. 6 weeks until healing of the herniation followed by 6 weeks of reconditioning and strengthening of the core musculature. Also discussed the need to employ the concepts of disc pressure management and core motion following the surgery to minimize  the risk of recurrent disc herniation. We will obtain preoperative clearance i if necessary and proceed accordingly.  Patient asked for pain medicine  A prescription for an opioid was given to be taken as directed for pain control. I discussed the risks including cognitive changes which may affect the ability to operate machinery and to drive. In addition the side effect of constipation as well as to avoid taking with conflicting medications that were discussed. The patient's prescription drug monitoring report was reviewed with no red flags noted.  She is to follow-up with a medical physician for treatment of presumed osteoporosis.  Plan microlumbar decompression L5-S1  Cecilie Kicks, PA-C for Dr Tonita Cong 03/23/2021, 1:26 PM

## 2021-03-23 NOTE — H&P (View-Only) (Signed)
[name] is an 60 y.o. female.   Chief Complaint: back and leg pain HPI: Reason for Visit: low back Context: The patient is 8 1/2 months out Location (Lower Extremity): lower back pain on the right; right buttock and hip Severity: pain level 8/10 Aggravating Factors: sitting for Associated Symptoms: no numbness/tingling; no weakness; cramping bilateral lower legs Medications: The patient was prescribed Percocet. She takes Aleve or Excedrin during the day. Notes: The patient is 8 weeks following a right S1 SNRB Patient reports 2 days of relief of pain. And then her pain returns it radiates down into the right leg outer aspect of the foot. 7 to take pain medicine.  Past Medical History:  Diagnosis Date   Anxiety    Arthritis    back (12/30/2014)   Cerebral cyst    per brain MRI 07-12-2019 unchanged 65mm cyst inferomedial right frontal lobe   Chronic headaches    Chronic systolic (congestive) heart failure ([place])    followed by dr Frances Nickels   Complication of anesthesia    pt is very sensitive to benzodiazepines, pt stated "almost died"   Compression fracture of lumbar vertebra ([place]) 05/27/2020   per pt L3 and L4   DCM (dilated cardiomyopathy) ([place])    w/ Takatsubo---  followed by dr Johnsie Cancel---  stress-induced --- cardiac cath 12-30-2014 ef 30-35%, recovered per cardiac MRI 03-15-2015 ef  64%   Depression    Disorder of mastoid    per brain MRI 07-12-2019 persistant large mastoid effusion   Dysuria    Environmental and seasonal allergies    Frequency of urination    GERD (gastroesophageal reflux disease)    History of asthma    childhood   History of esophageal spasm    History of kidney stones    History of melanoma excision 2001   right supraorbital (right forehead and upper eyelid )  s/p  Moh's procedure w/ sln bx,  no recurrence per pt   History of non-ST elevation myocardial infarction (NSTEMI) 12/29/2014   per cath normal coronaries , ef 30-35%---  stress-- induced  Takotsubo syndrome    History of palpitations 01/05/2015   STRESS INDUCED   History of parotid cancer 2008   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-2006 s/p  right lateral parotidectomy w/ nerve dissection , modified radical neck dissection ;  completed  x35 fractions raditation 2008;  no recurrence per pt   Hyperlipidemia    Hypertension    Kidney cysts    Left ureteral calculus    Low back pain    Melanoma ([place])    14 years ago per patient   Muscle spasm of back    PONV (postoperative nausea and vomiting)    Takotsubo syndrome 12/29/2014   cardiologist---  dr Frances Nickels--- dx NSTEMI -- stress-induced w/ DCM--  per cath 12-30-2014 normal coronaries , ef 30-35%,  recovered ef per cardiac MRI  ef 64%   Urgency of urination    Wears glasses     Past Surgical History:  Procedure Laterality Date   BREAST BIOPSY Bilateral early 2000's   CARDIAC CATHETERIZATION  2006 (APPROX)  [place]   NORMAL   CARDIAC CATHETERIZATION  12/30/2014   CARDIAC CATHETERIZATION N/A 12/30/2014   Procedure: Left Heart Cath and Coronary Angiography;  Surgeon: Wellington Hampshire, MD;  Location: [place] [facility];  Service: Cardiovascular;  Laterality: N/A;   CARDIOVASCULAR STRESS TEST  03-21-2012  DR Johnsie Cancel   NORMAL NUCLEAR STUDY/  NO  ISCHEMIA/  EF 63%   COLONOSCOPY WITH PROPOFOL  05/29/2017   CYSTOSCOPY W/ URETERAL STENT PLACEMENT Left 03/26/2013   Procedure: CYSTOSCOPY WITH RETROGRADE PYELOGRAM ;  Surgeon: Molli Hazard, MD;  Location: [facility];  Service: Urology;  Laterality: Left;   CYSTOSCOPY WITH RETROGRADE PYELOGRAM, URETEROSCOPY AND STENT PLACEMENT Bilateral 03/19/2013   Procedure: CYSTOSCOPY WITH RETROGRADE PYELOGRAM, BILATERAL URETEROSCOPY AND STENT PLACEMENT LEFT URETER,BILATERAL STONE EXTRACTION , HOLMIUM LASER LEFT URETER;  Surgeon: Molli Hazard, MD;  Location: [facility];  Service: Urology;  Laterality: Bilateral;   CYSTOSCOPY WITH STENT PLACEMENT  Left 03/26/2013   Procedure: CYSTOSCOPY WITH STENT PLACEMENT;  Surgeon: Molli Hazard, MD;  Location: [facility];  Service: Urology;  Laterality: Left;   CYSTOSCOPY/URETEROSCOPY/HOLMIUM LASER/STENT PLACEMENT Left 07/07/2020   Procedure: CYSTOSCOPY LEFT URETEROSCOPY/HOLMIUM LASER/STENT PLACEMENT;  Surgeon: Lucas Mallow, MD;  Location: [facility];  Service: Urology;  Laterality: Left;   DIAGNOSTIC LAPAROSCOPY  04-12-2009   ESOPHAGOGASTRODUODENOSCOPY (EGD) WITH PROPOFOL N/A 02/10/2016   Procedure: ESOPHAGOGASTRODUODENOSCOPY (EGD) WITH PROPOFOL;  Surgeon: Ronald Lobo, MD;  Location[facility];  Service: Endoscopy;  Laterality: N/A;   KIDNEY SURGERY  1966   BILATERAL URETER'S DILATATION   LUMBAR LAMINECTOMY/DECOMPRESSION MICRODISCECTOMY  05/18/2011   Procedure: LUMBAR LAMINECTOMY/DECOMPRESSION MICRODISCECTOMY;  Surgeon: Johnn Hai;  Location: [facility];  Service: Orthopedics;  Laterality: Right;  Decompression Lumbar 4-Lumbar 5  Right    (xray)    MELANOMA EXCISION WITH SENTINEL LYMPH NODE BIOPSY  2001   moh's procedure/  RIGHT FOREHEAD AND UPPER EYEBROW   RIGHT LATERAL PAROTIDECTOMY W/ NERVE DISSECTION / RIGHT MODIFIED RADICAL NECK DISSECTION SPARING SCM ELEVENTH NERVE AND INTERNAL JUGULAR VEIN  09-12-2006  DR DWIGHT BATES   DR DWIGHT BATES; "inside gland; lots of lymph nodes"    Family History  Problem Relation Age of Onset   Hypertension Mother    COPD Mother    Cancer Mother        breast   Dementia Mother    Heart disease Father    Cancer Father        Colorectal   Hyperlipidemia Father    Hypertension Father    Social History:  reports that she has never smoked. She has never used smokeless tobacco. She reports that she does not currently use alcohol. She reports that she does not use drugs.  Allergies:  Allergies  Allergen Reactions   Cephalexin Shortness Of Breath and Rash   Lorazepam Shortness Of Breath and Other (See  Comments)    She may be very sensitive to benzo.        Medications: atorvastatin 20 mg tablet carvediloL 3.125 mg tablet Ciprodex 0.3 %-0.1 % ear drops,suspension Lidoderm 5 % topical patch lisinopriL 2.5 mg tablet nitroglycerin 0.4 mg sublingual tablet omeprazole oxyCODONE 5 mg tablet  Review of Systems  Constitutional: Negative.   HENT: Negative.    Eyes: Negative.   Respiratory: Negative.    Cardiovascular: Negative.   Gastrointestinal: Negative.   Endocrine: Negative.   Genitourinary: Negative.   Musculoskeletal:  Positive for back pain, gait problem and myalgias.  Allergic/Immunologic: Negative.   Neurological:  Positive for weakness and numbness.   Last menstrual period 05/18/2011. Physical Exam Constitutional:      Appearance: Normal appearance.  HENT:     Head: Normocephalic.     Right Ear: External ear normal.     Left Ear: External ear normal.     Nose: Nose normal.  Mouth/Throat:     Pharynx: Oropharynx is clear.  Eyes:     Conjunctiva/sclera: Conjunctivae normal.  Cardiovascular:     Rate and Rhythm: Normal rate.     Pulses: Normal pulses.  Pulmonary:     Effort: Pulmonary effort is normal.     Breath sounds: Normal breath sounds.  Abdominal:     General: Bowel sounds are normal.  Musculoskeletal:     Cervical back: Normal range of motion.     Comments: Gait and Station: Appearance: ambulating with no assistive devices and antalgic gait.  Constitutional: General Appearance: healthy-appearing and distress (mild).  Psychiatric: Mood and Affect: active and alert.  Cardiovascular System: Edema Right: none; Dorsalis and posterior tibial pulses 2+. Edema Left: none.  Abdomen: Inspection and Palpation: non-distended and no tenderness.  Skin: Inspection and palpation: no rash.  Lumbar Spine: Inspection: normal alignment. Bony Palpation of the Lumbar Spine: tender at lumbosacral junction.. Bony Palpation of the Right Hip: no tenderness of the  greater trochanter and tenderness of the SI joint; Pelvis stable. Bony Palpation of the Left Hip: no tenderness of the greater trochanter and tenderness of the SI joint. Soft Tissue Palpation on the Right: No flank pain with percussion. Active Range of Motion: limited flexion and extention.  Motor Strength: L1 Motor Strength on the Right: hip flexion iliopsoas 5/5. L1 Motor Strength on the Left: hip flexion iliopsoas 5/5. L2-L4 Motor Strength on the Right: knee extension quadriceps 5/5. L2-L4 Motor Strength on the Left: knee extension quadriceps 5/5. L5 Motor Strength on the Right: ankle dorsiflexion tibialis anterior 5/5 and great toe extension extensor hallucis longus 5/5. L5 Motor Strength on the Left: ankle dorsiflexion tibialis anterior 5/5 and great toe extension extensor hallucis longus 5/5. S1 Motor Strength on the Right: plantar flexion gastrocnemius 5/5. S1 Motor Strength on the Left: plantar flexion gastrocnemius 5/5.  Neurological System: Knee Reflex Right: normal (2). Knee Reflex Left: normal (2). Ankle Reflex Right: normal (2). Ankle Reflex Left: normal (2). Babinski Reflex Right: plantar reflex absent. Babinski Reflex Left: plantar reflex absent. Sensation on the Right: normal distal extremities. Sensation on the Left: normal distal extremities. Special Tests on the Right: no clonus of the ankle/knee and seated straight leg raising test positive. Special Tests on the Left: no clonus of the ankle/knee.  Trace EHL weakness in the right compared to the left.  Skin:    General: Skin is warm and dry.  Neurological:     Mental Status: She is alert.    X-rays of the lumbar spine demonstrate a mild levorotatory scoliosis. Osteopenia. Disc degeneration L5-S1. Degenerative changes L3-4 L2-3 and the lower thoracic segments. L3-L4 compression fracture without change.  MRI demonstrated in March a subacute fracture at L3 and possibly L4. 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 Temporary relief from 2 epidural steroid injections at L5-S1. History of lumbar decompression  Plan:  We discussed options that include living with her symptoms with activity modification pain management etc. I do not believe any further epidurals will be of therapeutic benefit giving the temporary relief from 2 epidurals on the right L4-5  Other option would be to perform a decompression at L5-S1. Unilaterally or bilateral.  She would like to proceed with that.  I had an  extensive discussion with the patient concerning the pathology relevant anatomy and treatment options. At this point exhausting conservative treatment and in the presence of a neurologic deficit we discussed microlumbar decompression. I discussed the risks and benefits including bleeding, infection, DVT, PE, anesthetic complications, worsening in their symptoms, improvement in their symptoms, C SF leakage, epidural fibrosis, need for future surgeries such as revision discectomy and lumbar fusion. I also indicated that this is an operation to basically decompress the nerve root to allow recovery as opposed to fixing a herniated disc if iy is encountered and that the incidence of recurrent chest disc herniation can approach 15%. Also that nerve root recovery is variable and may not recover completely.  I discussed the operative course including overnight in the hospital. Immediate ambulation. Follow-up in 2 weeks for suture removal. 6 weeks until healing of the herniation followed by 6 weeks of reconditioning and strengthening of the core musculature. Also discussed the need to employ the concepts of disc pressure management and core motion following the surgery to minimize  the risk of recurrent disc herniation. We will obtain preoperative clearance i if necessary and proceed accordingly.  Patient asked for pain medicine  A prescription for an opioid was given to be taken as directed for pain control. I discussed the risks including cognitive changes which may affect the ability to operate machinery and to drive. In addition the side effect of constipation as well as to avoid taking with conflicting medications that were discussed. The patient's prescription drug monitoring report was reviewed with no red flags noted.  She is to follow-up with a medical physician for treatment of presumed osteoporosis.  Plan microlumbar decompression L5-S1  Cecilie Kicks, PA-C for Dr Tonita Cong 03/23/2021, 1:26 PM

## 2021-03-30 ENCOUNTER — Encounter (HOSPITAL_COMMUNITY): Payer: Self-pay | Admitting: Specialist

## 2021-03-30 ENCOUNTER — Ambulatory Visit (HOSPITAL_COMMUNITY): Payer: BC Managed Care – PPO | Admitting: Emergency Medicine

## 2021-03-30 NOTE — Anesthesia Preprocedure Evaluation (Deleted)
Anesthesia Evaluation  Patient identified by MRN, date of birth, ID band Patient awake    Reviewed: Allergy & Precautions, NPO status , Patient's Chart, lab work & pertinent test results  History of Anesthesia Complications (+) PONV and history of anesthetic complications  Airway Mallampati: II  TM Distance: >3 FB Neck ROM: Full    Dental  (+) Teeth Intact, Dental Advisory Given   Pulmonary asthma ,    breath sounds clear to auscultation       Cardiovascular hypertension, Pt. on home beta blockers and Pt. on medications + Past MI and +CHF   Rhythm:Regular Rate:Normal  Echo: - Left ventricle: The cavity size was normal. Systolic function was  mildly reduced. The estimated ejection fraction was 45%. Diffuse  hypokinesis. Doppler parameters are consistent with abnormal left  ventricular relaxation (grade 1 diastolic dysfunction). There was  no evidence of elevated ventricular filling pressure by Doppler  parameters.  - Aortic valve: Trileaflet; normal thickness leaflets. There was  trivial regurgitation.  - Aortic root: The aortic root was normal in size.  - Mitral valve: Structurally normal valve. There was no  regurgitation.  - Left atrium: The atrium was normal in size.  - Right ventricle: Systolic function was normal.  - Right atrium: The atrium was normal in size.  - Tricuspid valve: There was trivial regurgitation.  - Pulmonic valve: There was no regurgitation.  - Pulmonary arteries: Systolic pressure was within the normal  range.  - Inferior vena cava: The vessel was normal in size.  - Pericardium, extracardiac: There was no pericardial effusion.    Neuro/Psych  Headaches, PSYCHIATRIC DISORDERS Anxiety Depression  Neuromuscular disease    GI/Hepatic Neg liver ROS, GERD  Medicated,  Endo/Other    Renal/GU      Musculoskeletal   Abdominal Normal abdominal exam  (+)   Peds  Hematology    Anesthesia Other Findings   Reproductive/Obstetrics                           Anesthesia Physical Anesthesia Plan  ASA: 2  Anesthesia Plan: General   Post-op Pain Management:    Induction: Intravenous  PONV Risk Score and Plan: 4 or greater and Ondansetron, Dexamethasone, Midazolam and Scopolamine patch - Pre-op  Airway Management Planned: Oral ETT  Additional Equipment: None  Intra-op Plan:   Post-operative Plan: Extubation in OR  Informed Consent: I have reviewed the patients History and Physical, chart, labs and discussed the procedure including the risks, benefits and alternatives for the proposed anesthesia with the patient or authorized representative who has indicated his/her understanding and acceptance.     Dental advisory given  Plan Discussed with: CRNA  Anesthesia Plan Comments: (See APP note by Durel Salts, FNP   Cancelled due to positive COVID test. Will be rescheduled by [place].)       Anesthesia Quick Evaluation

## 2021-03-30 NOTE — Progress Notes (Signed)
Anesthesia Chart Review:  Pt is a same day work up   Case: [id number] Date/Time: 03/31/21 0715   Procedure: Microlumbar decompression L5-S1   Anesthesia type: General   Pre-op diagnosis: Spinal Stenosis L5-S1   Location: [facility] OR ROOM 19 / [place] [facility]   Surgeons: Susa Day, MD       DISCUSSION: Pt is 60 years old with hx Takatsubo cardiomyopathy (2016), CHF, HTN, palpitations, asthma  Anesthesia history includes: sensitive to benzodiazepines, pt stated "almost died"    PROVIDERS: - PCP is Dettinger, Fransisca Kaufmann, MD - Cardiologist is Jenkins Rouge, MD. Last office visit 01/17/21. Cleared for surgery at acceptable risk by Sande Rives, PA on 02/17/21  LABS: Will be obtained day of surgery    EKG 06/27/20: NSR. Low voltage QRS. LAFB. Anterior/anterolateral Twave inversion, possible ischemia pattern   CV: Cardiac MRI 02/2015: 1) Normal LV size and function 2) Quantitative EF 64% no RWMA;s 3) No scar tissue or hyperenhancement - Overall findings consistent with Takatsubo DCM    Echo 01/06/15:  - Left ventricle: The cavity size was normal. Systolic function was mildly reduced. The estimated ejection fraction was 45%. Diffuse hypokinesis. Doppler parameters are consistent with abnormal left ventricular relaxation (grade 1 diastolic dysfunction). There was no evidence of elevated ventricular filling pressure by Doppler parameters.  - Aortic valve: Trileaflet; normal thickness leaflets. There was trivial regurgitation.  - Aortic root: The aortic root was normal in size.  - Mitral valve: Structurally normal valve. There was no regurgitation.  - Left atrium: The atrium was normal in size.  - Right ventricle: Systolic function was normal.  - Right atrium: The atrium was normal in size.  - Tricuspid valve: There was trivial regurgitation.  - Pulmonic valve: There was no regurgitation.  - Pulmonary arteries: Systolic pressure was within the normal range.  - Inferior vena cava: The vessel was  normal in size.  - Pericardium, extracardiac: There was no pericardial effusion.    Cardiac cath 12/30/14:  1. Normal coronary arteries. 2. Moderately to severely reduced LV systolic function with an ejection fraction of 30-35% with severe hypokinesis of the mid distal anterior, apical and mid to distal inferior walls consistent with stress-induced cardiomyopathy. 3. Mildly elevated left ventricular end-diastolic pressure.   Past Medical History:  Diagnosis Date   Anxiety    Arthritis    back (12/30/2014)   Asthma    as a child, no problems as an adult, has an albuterol inhaler   Cerebral cyst    per brain MRI 07-12-2019 unchanged 57mm cyst inferomedial right frontal lobe   Chronic headaches    Chronic systolic (congestive) heart failure ([place])    followed by dr Frances Nickels   Complication of anesthesia    pt is very sensitive to benzodiazepines, pt stated "almost died"   Compression fracture of lumbar vertebra ([place]) 05/27/2020   per pt L3 and L4   DCM (dilated cardiomyopathy) ([place])    w/ Takatsubo---  followed by dr Johnsie Cancel---  stress-induced --- cardiac cath 12-30-2014 ef 30-35%, recovered per cardiac MRI 03-15-2015 ef  64%   Depression    Disorder of mastoid    per brain MRI 07-12-2019 persistant large mastoid effusion   Dysuria    Environmental and seasonal allergies    Frequency of urination    GERD (gastroesophageal reflux disease)    History of asthma    childhood   History of esophageal spasm    History of kidney stones    History of melanoma excision  2001   right supraorbital (right forehead and upper eyelid )  s/p  Moh's procedure w/ sln bx,  no recurrence per pt   History of non-ST elevation myocardial infarction (NSTEMI) 12/29/2014   per cath normal coronaries , ef 30-35%---  stress-- induced Takotsubo syndrome    History of palpitations 01/05/2015   STRESS INDUCED   History of parotid cancer 2008   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-2006  s/p  right lateral parotidectomy w/ nerve dissection , modified radical neck dissection ;  completed  x35 fractions raditation 2008;  no recurrence per pt   Hyperlipidemia    Hypertension    Kidney cysts    Left ureteral calculus    Low back pain    Melanoma ([place]) 2001   face   Muscle spasm of back    Myocardial infarction ([place]) 2016   PONV (postoperative nausea and vomiting)    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   right side   Takotsubo syndrome 12/29/2014   cardiologist---  dr Frances Nickels--- dx NSTEMI -- stress-induced w/ DCM--  per cath 12-30-2014 normal coronaries , ef 30-35%,  recovered ef per cardiac MRI  ef 64%   Urgency of urination    Wears glasses     Past Surgical History:  Procedure Laterality Date   BREAST BIOPSY Bilateral early 2000's   CARDIAC CATHETERIZATION  2006 (APPROX)  [place]   NORMAL   CARDIAC CATHETERIZATION  12/30/2014   CARDIAC CATHETERIZATION N/A 12/30/2014   Procedure: Left Heart Cath and Coronary Angiography;  Surgeon: Wellington Hampshire, MD;  Location: [place] [facility];  Service: Cardiovascular;  Laterality: N/A;   CARDIOVASCULAR STRESS TEST  03-21-2012  DR Johnsie Cancel   NORMAL NUCLEAR STUDY/  NO ISCHEMIA/  EF 63%   COLONOSCOPY  10/2020   COLONOSCOPY WITH PROPOFOL  05/29/2017   CYSTOSCOPY W/ URETERAL STENT PLACEMENT Left 03/26/2013   Procedure: CYSTOSCOPY WITH RETROGRADE PYELOGRAM ;  Surgeon: Molli Hazard, MD;  Location: [facility];  Service: Urology;  Laterality: Left;   CYSTOSCOPY WITH RETROGRADE PYELOGRAM, URETEROSCOPY AND STENT PLACEMENT Bilateral 03/19/2013   Procedure: CYSTOSCOPY WITH RETROGRADE PYELOGRAM, BILATERAL URETEROSCOPY AND STENT PLACEMENT LEFT URETER,BILATERAL STONE EXTRACTION , HOLMIUM LASER LEFT URETER;  Surgeon: Molli Hazard, MD;  [facility];  Service: Urology;  Laterality: Bilateral;   CYSTOSCOPY WITH STENT PLACEMENT Left 03/26/2013   Procedure: CYSTOSCOPY WITH STENT PLACEMENT;  Surgeon: Molli Hazard, MD;  Location: [facility];  Service: Urology;  Laterality: Left;   CYSTOSCOPY/URETEROSCOPY/HOLMIUM LASER/STENT PLACEMENT Left 07/07/2020   Procedure: CYSTOSCOPY LEFT URETEROSCOPY/HOLMIUM LASER/STENT PLACEMENT;  Surgeon: Lucas Mallow, MD;  Location: [facility];  Service: Urology;  Laterality: Left;   DIAGNOSTIC LAPAROSCOPY  04/12/2009   ESOPHAGOGASTRODUODENOSCOPY (EGD) WITH PROPOFOL N/A 02/10/2016   Procedure: ESOPHAGOGASTRODUODENOSCOPY (EGD) WITH PROPOFOL;  Surgeon: Ronald Lobo, MD;  [facility];  Service: Endoscopy;  Laterality: N/A;   KIDNEY SURGERY  1966   BILATERAL URETER'S DILATATION   LUMBAR LAMINECTOMY/DECOMPRESSION MICRODISCECTOMY  05/18/2011   Procedure: LUMBAR LAMINECTOMY/DECOMPRESSION MICRODISCECTOMY;  Surgeon: Johnn Hai;  [facility];  Service: Orthopedics;  Laterality: Right;  Decompression Lumbar 4-Lumbar 5  Right    (xray)    MELANOMA EXCISION WITH SENTINEL LYMPH NODE BIOPSY  2001   moh's procedure/  RIGHT FOREHEAD AND UPPER EYEBROW   RIGHT LATERAL PAROTIDECTOMY W/ NERVE DISSECTION / RIGHT MODIFIED RADICAL NECK DISSECTION SPARING SCM ELEVENTH NERVE AND INTERNAL JUGULAR VEIN  09-12-2006  DR DWIGHT BATES   DR DWIGHT BATES; "inside gland; lots of lymph nodes"    MEDICATIONS: No current facility-administered medications for this encounter.    albuterol (PROVENTIL HFA;VENTOLIN HFA) 108 (90 Base) MCG/ACT inhaler   aspirin-acetaminophen-caffeine (EXCEDRIN MIGRAINE) 250-250-65 MG tablet   atorvastatin (LIPITOR) 20 MG tablet   Carboxymethylcellulose Sodium (THERATEARS) 0.25 % SOLN   carvedilol (COREG) 3.125 MG tablet   HYDROcodone-acetaminophen (NORCO/VICODIN) 5-325 MG tablet   lidocaine (LIDODERM) 5 %   lisinopril (ZESTRIL) 2.5 MG tablet   loratadine (CLARITIN) 10 MG tablet   nitroGLYCERIN (NITROSTAT) 0.4 MG SL tablet   pantoprazole (PROTONIX) 40 MG tablet   silver sulfADIAZINE (SILVADENE) 1 % cream     If labs acceptable day of surgery, I anticipate pt can proceed with surgery as scheduled.  Willeen Cass, PhD, FNP-BC [facility]/Anesthesiology Phone: [phone] 03/30/2021 9:14 AM

## 2021-03-30 NOTE — Progress Notes (Signed)
DUE TO COVID-19 ONLY ONE VISITOR IS ALLOWED TO COME WITH YOU AND STAY IN THE WAITING ROOM ONLY DURING PRE OP AND PROCEDURE DAY OF SURGERY.   Two  VISITORS  MAY VISIT WITH YOU AFTER SURGERY IN YOUR PRIVATE ROOM DURING VISITING HOURS ONLY!   PCP - Dr Merrily Pew Dettinger Cardiologist - Dr Jenkins Rouge  Chest x-ray - n/a EKG - 07/07/20 Stress Test - 03/21/12 ECHO - 01/06/15 Cardiac Cath - 12/30/14  ICD Pacemaker/Loop - n/a  Sleep Study -  n/a CPAP - none  ERAS: Clear liquids til 0430 DOS.  Clear liquids were reviewed with patient.  Anesthesia review: Yes  STOP now taking any Aspirin (unless otherwise instructed by your surgeon), Aleve, Naproxen, Ibuprofen, Motrin, Advil, Goody's, BC's, all herbal medications, fish oil, and all vitamins.   Coronavirus Screening Covid test is scheduled on DOS Do you have any of the following symptoms:  Cough yes/no: No Fever (>100.59F)  yes/no: No Runny nose yes/no: No Sore throat yes/no: No Difficulty breathing/shortness of breath  yes/no: No  Have you traveled in the last 14 days and where? yes/no: No  Patient verbalized understanding of instructions that were given via phone.

## 2021-03-31 ENCOUNTER — Encounter (HOSPITAL_COMMUNITY): Payer: Self-pay | Admitting: Specialist

## 2021-03-31 ENCOUNTER — Ambulatory Visit (HOSPITAL_COMMUNITY): Payer: BC Managed Care – PPO

## 2021-03-31 ENCOUNTER — Ambulatory Visit (HOSPITAL_COMMUNITY)
Admission: RE | Admit: 2021-03-31 | Discharge: 2021-03-31 | Disposition: A | Discharge status: Home / Self Care | Payer: BC Managed Care – PPO | Attending: Specialist | Admitting: Specialist

## 2021-03-31 ENCOUNTER — Encounter (HOSPITAL_COMMUNITY)
Admission: RE | Disposition: A | Discharge status: Home / Self Care | Payer: Self-pay | Source: Home / Self Care | Attending: Specialist

## 2021-03-31 DIAGNOSIS — Z01818 Encounter for other preprocedural examination: Secondary | ICD-10-CM | POA: Insufficient documentation

## 2021-03-31 DIAGNOSIS — Z538 Procedure and treatment not carried out for other reasons: Secondary | ICD-10-CM | POA: Insufficient documentation

## 2021-03-31 DIAGNOSIS — M5136 Other intervertebral disc degeneration, lumbar region: Secondary | ICD-10-CM | POA: Insufficient documentation

## 2021-03-31 DIAGNOSIS — Q7649 Other congenital malformations of spine, not associated with scoliosis: Secondary | ICD-10-CM | POA: Diagnosis not present

## 2021-03-31 DIAGNOSIS — M4856XA Collapsed vertebra, not elsewhere classified, lumbar region, initial encounter for fracture: Secondary | ICD-10-CM | POA: Diagnosis not present

## 2021-03-31 DIAGNOSIS — U071 COVID-19: Secondary | ICD-10-CM | POA: Insufficient documentation

## 2021-03-31 DIAGNOSIS — Z419 Encounter for procedure for purposes other than remedying health state, unspecified: Secondary | ICD-10-CM

## 2021-03-31 DIAGNOSIS — M5126 Other intervertebral disc displacement, lumbar region: Secondary | ICD-10-CM

## 2021-03-31 LAB — SURGICAL PCR SCREEN
MRSA, PCR: NEGATIVE
Staphylococcus aureus: NEGATIVE

## 2021-03-31 LAB — CBC
HCT: 35.6 % — ABNORMAL LOW (ref 36.0–46.0)
Hemoglobin: 12.4 g/dL (ref 12.0–15.0)
MCH: 32.2 pg (ref 26.0–34.0)
MCHC: 34.8 g/dL (ref 30.0–36.0)
MCV: 92.5 fL (ref 80.0–100.0)
Platelets: 148 10*3/uL — ABNORMAL LOW (ref 150–400)
RBC: 3.85 MIL/uL — ABNORMAL LOW (ref 3.87–5.11)
RDW: 12 % (ref 11.5–15.5)
WBC: 4.1 10*3/uL (ref 4.0–10.5)
nRBC: 0 % (ref 0.0–0.2)

## 2021-03-31 LAB — BASIC METABOLIC PANEL
Anion gap: 8 (ref 5–15)
BUN: 13 mg/dL (ref 8–23)
CO2: 26 mmol/L (ref 22–32)
Calcium: 9.1 mg/dL (ref 8.9–10.3)
Chloride: 105 mmol/L (ref 98–111)
Creatinine, Ser: 0.86 mg/dL (ref 0.44–1.00)
GFR, Estimated: >60 mL/min (ref >60)
Glucose, Bld: 107 mg/dL — ABNORMAL HIGH (ref 70–99)
Potassium: 4 mmol/L (ref 3.5–5.1)
Sodium: 139 mmol/L (ref 135–145)

## 2021-03-31 LAB — SARS CORONAVIRUS 2 BY RT PCR (HOSPITAL ORDER, PERFORMED IN ~~LOC~~ HOSPITAL LAB): SARS Coronavirus 2: POSITIVE — AB

## 2021-03-31 SURGERY — LUMBAR LAMINECTOMY/DECOMPRESSION MICRODISCECTOMY 1 LEVEL
Anesthesia: General

## 2021-03-31 MED ORDER — LACTATED RINGERS IV SOLN: INTRAVENOUS | Status: DC

## 2021-03-31 MED ORDER — CHLORHEXIDINE GLUCONATE 0.12 % MT SOLN
OROMUCOSAL | Status: AC
  Filled 2021-03-31: qty 15

## 2021-03-31 MED ORDER — ACETAMINOPHEN 10 MG/ML IV SOLN
1000.0000 mg | INTRAVENOUS | Status: DC
  Filled 2021-03-31: qty 100

## 2021-03-31 MED ORDER — FENTANYL CITRATE (PF) 250 MCG/5ML IJ SOLN
INTRAMUSCULAR | Status: AC
  Filled 2021-03-31: qty 5

## 2021-03-31 MED ORDER — SCOPOLAMINE 1 MG/3DAYS TD PT72
MEDICATED_PATCH | TRANSDERMAL | Status: AC
  Administered 2021-03-31: 1.5 mg via TRANSDERMAL
  Filled 2021-03-31: qty 1

## 2021-03-31 MED ORDER — GENTAMICIN SULFATE 40 MG/ML IJ SOLN
1.5000 mg/kg | INTRAVENOUS | Status: DC
  Filled 2021-03-31: qty 3.25

## 2021-03-31 MED ORDER — PROPOFOL 10 MG/ML IV BOLUS
INTRAVENOUS | Status: AC
  Filled 2021-03-31: qty 20

## 2021-03-31 MED ORDER — MIDAZOLAM HCL 2 MG/2ML IJ SOLN
INTRAMUSCULAR | Status: AC
  Filled 2021-03-31: qty 2

## 2021-03-31 MED ORDER — VANCOMYCIN HCL IN DEXTROSE 1-5 GM/200ML-% IV SOLN
1000.0000 mg | INTRAVENOUS | Status: AC
  Administered 2021-03-31: 1000 mg via INTRAVENOUS
  Filled 2021-03-31: qty 200

## 2021-03-31 MED ORDER — SCOPOLAMINE 1 MG/3DAYS TD PT72: 1.0000 | MEDICATED_PATCH | TRANSDERMAL | Status: DC

## 2021-03-31 NOTE — Progress Notes (Signed)
Patient's surgery cancelled due to positive covid test. Patient is aware and has spoken with Dr. Tonita Cong. IV was removed and intact. Patient dressed and discharged home. Patient states she drove herself to the hospital and is ambulatory. Patient ambulated out with nurse. Patient is asymptomatic, stable, and in no distress.

## 2021-03-31 NOTE — Interval H&P Note (Signed)
History and Physical Interval Note:  Patient is tested positive for COVID.  I discussed with her rescheduling.  Patient does not have a neurologic deficit. Recommend to her to talk to her medical doctor about whether she should have any treatment for the COVID diagnosis. She currently reports no symptoms. We will get her rescheduled to soon as possible. Apparently the patient did not come in for a preop testing a week ago as they were unable to contact her. Test was therefore taken this morning.    03/31/2021 7:21 AM   Johnn Hai

## 2021-03-31 NOTE — Progress Notes (Signed)
Received call from microbiology that patient's Covid test was positive.  Dr. Smith Robert made aware.

## 2021-04-01 ENCOUNTER — Encounter: Payer: Self-pay | Admitting: Family Medicine

## 2021-04-01 ENCOUNTER — Ambulatory Visit (INDEPENDENT_AMBULATORY_CARE_PROVIDER_SITE_OTHER): Payer: BC Managed Care – PPO | Admitting: Family Medicine

## 2021-04-01 DIAGNOSIS — U071 COVID-19: Secondary | ICD-10-CM | POA: Diagnosis not present

## 2021-04-01 MED ORDER — MOLNUPIRAVIR EUA 200MG CAPSULE: 4.0000 | ORAL_CAPSULE | Freq: Two times a day (BID) | ORAL | 0 refills | Status: DC

## 2021-04-01 MED ORDER — MOLNUPIRAVIR EUA 200MG CAPSULE: 4.0000 | ORAL_CAPSULE | Freq: Two times a day (BID) | ORAL | 0 refills | Status: AC

## 2021-04-01 NOTE — Progress Notes (Signed)
Virtual Visit via telephone Note Due to COVID-19 pandemic this visit was conducted virtually. This visit type was conducted due to national recommendations for restrictions regarding the COVID-19 Pandemic (e.g. social distancing, sheltering in place) in an effort to limit this patient's exposure and mitigate transmission in our community. All issues noted in this document were discussed and addressed.  A physical exam was not performed with this format.   I connected with [name] on 04/01/2021 at 0915 by telephone and verified that I am speaking with the correct person using two identifiers. [name] is currently located at home and family is currently with them during visit. The provider, Monia Pouch, FNP is located in their office at time of visit.  I discussed the limitations, risks, security and privacy concerns of performing an evaluation and management service by telephone and the availability of in person appointments. I also discussed with the patient that there may be a patient responsible charge related to this service. The patient expressed understanding and agreed to proceed.  Subjective:  Patient ID: [name], female    DOB: 1960/12/01, 60 y.o.   MRN: [id number]  Chief Complaint:  Covid Positive   HPI: [name] is a 60 y.o. female presenting on 04/01/2021 for Covid Positive   Pt reports testing positive for COVID-19 yesterday. Woke up this morning with symptoms. States she has headache, cough, congestion, myalgias, and malaise. No shortness of breath, fever, chills, weakness, or confusion.     Relevant past medical, surgical, family, and social history reviewed and updated as indicated.  Allergies and medications reviewed and updated.   Past Medical History:  Diagnosis Date   Anxiety    Arthritis    back (12/30/2014)   Asthma    as a child, no problems as an adult, has an albuterol inhaler   Cerebral cyst    per brain MRI 07-12-2019  unchanged 27mm cyst inferomedial right frontal lobe   Chronic headaches    Chronic systolic (congestive) heart failure ([place])    followed by dr Frances Nickels   Complication of anesthesia    pt is very sensitive to benzodiazepines, pt stated "almost died"   Compression fracture of lumbar vertebra ([place]) 05/27/2020   per pt L3 and L4   DCM (dilated cardiomyopathy) ([place])    w/ Takatsubo---  followed by dr Johnsie Cancel---  stress-induced --- cardiac cath 12-30-2014 ef 30-35%, recovered per cardiac MRI 03-15-2015 ef  64%   Depression    Disorder of mastoid    per brain MRI 07-12-2019 persistant large mastoid effusion   Dysuria    Environmental and seasonal allergies    Frequency of urination    GERD (gastroesophageal reflux disease)    History of asthma    childhood   History of esophageal spasm    History of kidney stones    History of melanoma excision 2001   right supraorbital (right forehead and upper eyelid )  s/p  Moh's procedure w/ sln bx,  no recurrence per pt   History of non-ST elevation myocardial infarction (NSTEMI) 12/29/2014   per cath normal coronaries , ef 30-35%---  stress-- induced Takotsubo syndrome    History of palpitations 01/05/2015   STRESS INDUCED   History of parotid cancer 2008   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-2006 s/p  right lateral parotidectomy w/ nerve dissection , modified radical neck dissection ;  completed  x35 fractions raditation 2008;  no recurrence per pt  Hyperlipidemia    Hypertension    Kidney cysts    Left ureteral calculus    Low back pain    Melanoma ([place]) 2001   face   Muscle spasm of back    Myocardial infarction ([place]) 2016   PONV (postoperative nausea and vomiting)    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   Takotsubo syndrome 12/29/2014   cardiologist---  dr Frances Nickels--- dx NSTEMI -- stress-induced w/ DCM--  per cath 12-30-2014 normal coronaries , ef 30-35%,  recovered ef per cardiac MRI  ef 64%   Urgency of  urination    Wears glasses     Past Surgical History:  Procedure Laterality Date   BREAST BIOPSY Bilateral early 2000's   CARDIAC CATHETERIZATION  2006 (APPROX)  [place]   NORMAL   CARDIAC CATHETERIZATION  12/30/2014   CARDIAC CATHETERIZATION N/A 12/30/2014   Procedure: Left Heart Cath and Coronary Angiography;  Surgeon: Wellington Hampshire, MD;  Location: [facility];  Service: Cardiovascular;  Laterality: N/A;   CARDIOVASCULAR STRESS TEST  03-21-2012  DR Johnsie Cancel   NORMAL NUCLEAR STUDY/  NO ISCHEMIA/  EF 63%   COLONOSCOPY  10/2020   COLONOSCOPY WITH PROPOFOL  05/29/2017   CYSTOSCOPY W/ URETERAL STENT PLACEMENT Left 03/26/2013   Procedure: CYSTOSCOPY WITH RETROGRADE PYELOGRAM ;  Surgeon: Molli Hazard, MD;  Location: [facility];  Service: Urology;  Laterality: Left;   CYSTOSCOPY WITH RETROGRADE PYELOGRAM, URETEROSCOPY AND STENT PLACEMENT Bilateral 03/19/2013   Procedure: CYSTOSCOPY WITH RETROGRADE PYELOGRAM, BILATERAL URETEROSCOPY AND STENT PLACEMENT LEFT URETER,BILATERAL STONE EXTRACTION , HOLMIUM LASER LEFT URETER;  Surgeon: Molli Hazard, MD;  Location: [facility];  Service: Urology;  Laterality: Bilateral;   CYSTOSCOPY WITH STENT PLACEMENT Left 03/26/2013   Procedure: CYSTOSCOPY WITH STENT PLACEMENT;  Surgeon: Molli Hazard, MD;  Location: [facility];  Service: Urology;  Laterality: Left;   CYSTOSCOPY/URETEROSCOPY/HOLMIUM LASER/STENT PLACEMENT Left 07/07/2020   Procedure: CYSTOSCOPY LEFT URETEROSCOPY/HOLMIUM LASER/STENT PLACEMENT;  Surgeon: Lucas Mallow, MD;  Location: [facility];  Service: Urology;  Laterality: Left;   DIAGNOSTIC LAPAROSCOPY  04/12/2009   ESOPHAGOGASTRODUODENOSCOPY (EGD) WITH PROPOFOL N/A 02/10/2016   Procedure: ESOPHAGOGASTRODUODENOSCOPY (EGD) WITH PROPOFOL;  Surgeon: Ronald Lobo, MD;  Location: [facility];  Service: Endoscopy;  Laterality: N/A;   KIDNEY SURGERY  1966   BILATERAL  URETER'S DILATATION   LUMBAR LAMINECTOMY/DECOMPRESSION MICRODISCECTOMY  05/18/2011   Procedure: LUMBAR LAMINECTOMY/DECOMPRESSION MICRODISCECTOMY;  Surgeon: Johnn Hai;  Location: [facility];  Service: Orthopedics;  Laterality: Right;  Decompression Lumbar 4-Lumbar 5  Right    (xray)    MELANOMA EXCISION WITH SENTINEL LYMPH NODE BIOPSY  2001   moh's procedure/  RIGHT FOREHEAD AND UPPER EYEBROW   RIGHT LATERAL PAROTIDECTOMY W/ NERVE DISSECTION / RIGHT MODIFIED RADICAL NECK DISSECTION SPARING SCM ELEVENTH NERVE AND INTERNAL JUGULAR VEIN  09-12-2006  DR DWIGHT BATES   DR DWIGHT BATES; "inside gland; lots of lymph nodes"    Social History   Socioeconomic History   Marital status: Married    Spouse name: Not on file   Number of children: Not on file   Years of education: Not on file   Highest education level: Not on file  Occupational History   Occupation: Scientist, water quality at [employer]   Smoking status: Never   Smokeless tobacco: Never  Vaping Use   Vaping Use: Never used  Substance and Sexual Activity   Alcohol use: Not Currently  Comment: occasional   Drug use: Never   Sexual activity: Not on file  Other Topics Concern   Not on file  Social History Narrative   Not on file   Social Determinants of Health   Financial Resource Strain: Not on file  Food Insecurity: Not on file  Transportation Needs: Not on file  Physical Activity: Not on file  Stress: Not on file  Social Connections: Not on file  Intimate Partner Violence: Not on file    Outpatient Encounter Medications as of 04/01/2021  Medication Sig   [DISCONTINUED] molnupiravir EUA (LAGEVRIO) 200 mg CAPS capsule Take 4 capsules (800 mg total) by mouth 2 (two) times daily for 5 days.   albuterol (PROVENTIL HFA;VENTOLIN HFA) 108 (90 Base) MCG/ACT inhaler Inhale 2 puffs into the lungs every 6 (six) hours as needed for wheezing or shortness of breath.   aspirin-acetaminophen-caffeine (EXCEDRIN MIGRAINE) 250-250-65 MG  tablet Take 2 tablets by mouth every 6 (six) hours as needed for headache.   atorvastatin (LIPITOR) 20 MG tablet Take 1 tablet (20 mg total) by mouth daily.   Carboxymethylcellulose Sodium (THERATEARS) 0.25 % SOLN Place 1 drop into both eyes daily as needed (dry eyes).   carvedilol (COREG) 3.125 MG tablet TAKE 1 TABLET BY MOUTH TWICE DAILY WITH A MEAL   HYDROcodone-acetaminophen (NORCO/VICODIN) 5-325 MG tablet Take 1 tablet by mouth every 6 (six) hours as needed for moderate pain.   lidocaine (LIDODERM) 5 % Place 1 patch onto the skin daily as needed (pain).   lisinopril (ZESTRIL) 2.5 MG tablet Take 1 tablet (2.5 mg total) by mouth every morning.   loratadine (CLARITIN) 10 MG tablet Take 10 mg by mouth daily as needed for allergies.   molnupiravir EUA (LAGEVRIO) 200 mg CAPS capsule Take 4 capsules (800 mg total) by mouth 2 (two) times daily for 5 days.   nitroGLYCERIN (NITROSTAT) 0.4 MG SL tablet Place 1 tablet (0.4 mg total) under the tongue every 5 (five) minutes as needed for chest pain. DISSOLVE ONE TABLET UNDER THE TONGUE EVERY 5 MINUTES AS NEEDED FOR CHEST PAIN.  DO NOT EXCEED A TOTAL OF 3 DOSES IN 15 MINUTES   pantoprazole (PROTONIX) 40 MG tablet Take 40 mg by mouth daily as needed (GERD).   silver sulfADIAZINE (SILVADENE) 1 % cream Apply 1 application topically daily. Apply to shoulder nightly 7-14 days (Patient not taking: No sig reported)   No facility-administered encounter medications on file as of 04/01/2021.    Allergies  Allergen Reactions   Cephalexin Shortness Of Breath and Rash   Lorazepam Shortness Of Breath and Other (See Comments)    She may be very sensitive to benzo.    Stops breathing     Review of Systems  Constitutional:  Positive for activity change. Negative for appetite change, chills, diaphoresis, fatigue, fever and unexpected weight change.  HENT:  Positive for congestion, rhinorrhea and sore throat.   Eyes: Negative.   Respiratory:  Positive for cough.  Negative for chest tightness and shortness of breath.   Cardiovascular:  Negative for chest pain, palpitations and leg swelling.  Gastrointestinal:  Negative for abdominal pain, blood in stool, constipation, diarrhea, nausea and vomiting.  Endocrine: Negative.   Genitourinary:  Negative for decreased urine volume, difficulty urinating, dysuria, frequency and urgency.  Musculoskeletal:  Positive for arthralgias and myalgias.  Skin: Negative.   Allergic/Immunologic: Negative.   Neurological:  Positive for headaches. Negative for dizziness, tremors, seizures, syncope, facial asymmetry, speech difficulty, weakness, light-headedness and numbness.  Hematological:  Negative.   Psychiatric/Behavioral:  Negative for confusion, hallucinations, sleep disturbance and suicidal ideas.   All other systems reviewed and are negative.       Observations/Objective: No vital signs or physical exam, this was a telephone or virtual health encounter.  Pt alert and oriented, answers all questions appropriately, and able to speak in full sentences.    Assessment and Plan: Layni was seen today for covid positive.  Diagnoses and all orders for this visit:  COVID-19 Positive for COVID-19 with significant comorbidities. Will start antiviral therapy along with symptomatic care. Pt aware to report any new, worsening, or persistent symptoms.  -     molnupiravir EUA (LAGEVRIO) 200 mg CAPS capsule; Take 4 capsules (800 mg total) by mouth 2 (two) times daily for 5 days.     Follow Up Instructions: Return if symptoms worsen or fail to improve.    I discussed the assessment and treatment plan with the patient. The patient was provided an opportunity to ask questions and all were answered. The patient agreed with the plan and demonstrated an understanding of the instructions.   The patient was advised to call back or seek an in-person evaluation if the symptoms worsen or if the condition fails to improve as  anticipated.  The above assessment and management plan was discussed with the patient. The patient verbalized understanding of and has agreed to the management plan. Patient is aware to call the clinic if they develop any new symptoms or if symptoms persist or worsen. Patient is aware when to return to the clinic for a follow-up visit. Patient educated on when it is appropriate to go to the emergency department.    I provided 15 minutes of non-face-to-face time during this encounter. The call started at 0915. The call ended at 0930. The other time was used for coordination of care.    Monia Pouch, FNP-C [facility] [address] [place],  [place] [address] [phone] 04/01/2021

## 2021-04-04 ENCOUNTER — Telehealth: Payer: Self-pay

## 2021-04-04 NOTE — Telephone Encounter (Signed)
Positive Covid test result given to husband today for pt.

## 2021-04-07 ENCOUNTER — Ambulatory Visit: Payer: Self-pay | Admitting: Orthopedic Surgery

## 2021-04-07 DIAGNOSIS — M48062 Spinal stenosis, lumbar region with neurogenic claudication: Secondary | ICD-10-CM

## 2021-04-07 MED ORDER — ACETAMINOPHEN 10 MG/ML IV SOLN
1000.0000 mg | INTRAVENOUS | Status: AC
Start: 2021-04-08 — End: 2021-04-09

## 2021-04-07 MED ORDER — GENTAMICIN SULFATE 40 MG/ML IJ SOLN: 1.5000 mg/kg | INTRAVENOUS | Status: AC

## 2021-04-11 DIAGNOSIS — I959 Hypotension, unspecified: Secondary | ICD-10-CM | POA: Diagnosis not present

## 2021-04-11 DIAGNOSIS — K644 Residual hemorrhoidal skin tags: Secondary | ICD-10-CM | POA: Diagnosis not present

## 2021-04-11 DIAGNOSIS — J9811 Atelectasis: Secondary | ICD-10-CM | POA: Diagnosis not present

## 2021-04-11 DIAGNOSIS — A09 Infectious gastroenteritis and colitis, unspecified: Secondary | ICD-10-CM | POA: Diagnosis not present

## 2021-04-11 DIAGNOSIS — U071 COVID-19: Secondary | ICD-10-CM | POA: Diagnosis not present

## 2021-04-11 DIAGNOSIS — Z743 Need for continuous supervision: Secondary | ICD-10-CM | POA: Diagnosis not present

## 2021-04-11 DIAGNOSIS — Z881 Allergy status to other antibiotic agents status: Secondary | ICD-10-CM | POA: Diagnosis not present

## 2021-04-11 DIAGNOSIS — R58 Hemorrhage, not elsewhere classified: Secondary | ICD-10-CM | POA: Diagnosis not present

## 2021-04-11 DIAGNOSIS — K760 Fatty (change of) liver, not elsewhere classified: Secondary | ICD-10-CM | POA: Diagnosis not present

## 2021-04-11 DIAGNOSIS — Z79899 Other long term (current) drug therapy: Secondary | ICD-10-CM | POA: Diagnosis not present

## 2021-04-11 DIAGNOSIS — Z7982 Long term (current) use of aspirin: Secondary | ICD-10-CM | POA: Diagnosis not present

## 2021-04-11 DIAGNOSIS — K529 Noninfective gastroenteritis and colitis, unspecified: Secondary | ICD-10-CM | POA: Diagnosis not present

## 2021-04-11 DIAGNOSIS — I252 Old myocardial infarction: Secondary | ICD-10-CM | POA: Diagnosis not present

## 2021-04-11 DIAGNOSIS — K625 Hemorrhage of anus and rectum: Secondary | ICD-10-CM | POA: Diagnosis not present

## 2021-04-11 DIAGNOSIS — K589 Irritable bowel syndrome without diarrhea: Secondary | ICD-10-CM | POA: Diagnosis not present

## 2021-04-11 DIAGNOSIS — R1032 Left lower quadrant pain: Secondary | ICD-10-CM | POA: Diagnosis not present

## 2021-04-13 ENCOUNTER — Telehealth: Payer: Self-pay | Admitting: Family Medicine

## 2021-04-14 NOTE — Telephone Encounter (Signed)
Appointment made with Dr. Warrick Parisian Oct 28th at 10:25 am

## 2021-04-15 ENCOUNTER — Encounter: Payer: Self-pay | Admitting: Family Medicine

## 2021-04-15 ENCOUNTER — Ambulatory Visit (INDEPENDENT_AMBULATORY_CARE_PROVIDER_SITE_OTHER): Payer: BC Managed Care – PPO | Admitting: Family Medicine

## 2021-04-15 ENCOUNTER — Other Ambulatory Visit: Payer: Self-pay

## 2021-04-15 VITALS — BP 120/74 | HR 78 | Wt 183.0 lb

## 2021-04-15 DIAGNOSIS — Z029 Encounter for administrative examinations, unspecified: Secondary | ICD-10-CM

## 2021-04-15 DIAGNOSIS — K529 Noninfective gastroenteritis and colitis, unspecified: Secondary | ICD-10-CM

## 2021-04-15 MED ORDER — PROMETHAZINE HCL 25 MG PO TABS
25.0000 mg | ORAL_TABLET | Freq: Three times a day (TID) | ORAL | 0 refills | Status: DC | PRN
Start: 2021-04-15 — End: 2021-06-24

## 2021-04-15 NOTE — Progress Notes (Signed)
BP 120/74   Pulse 78   Wt 183 lb (83 kg)   LMP 05/18/2011   SpO2 95%   BMI 31.41 kg/m    Subjective:   Patient ID: [name], female    DOB: [birth year], 60 y.o.   MRN: [id number]  HPI: KIAUNA Vazquez is a 60 y.o. female presenting on 04/15/2021 for Hospitalization Follow-up (Colitis flare- Sees [facility]), Constipation, and Nausea   HPI Hospital follow-up for colitis Patient is coming in for follow-up for colitis.  She was in the ER on 04/11/2021.  She says she had a lot of bleeding per rectum but that has been improved but she is still having a lot of abdominal pain issues and that sometimes on the left lower and then sometimes right upper.  She has some nausea and has not been able to take the antibiotics completely.  Relevant past medical, surgical, family and social history reviewed and updated as indicated. Interim medical history since our last visit reviewed. Allergies and medications reviewed and updated.  Review of Systems  Constitutional:  Negative for chills and fever.  Eyes:  Negative for visual disturbance.  Respiratory:  Negative for chest tightness and shortness of breath.   Cardiovascular:  Negative for chest pain and leg swelling.  Gastrointestinal:  Positive for abdominal pain, constipation, diarrhea and nausea. Negative for blood in stool.  Musculoskeletal:  Negative for back pain and gait problem.  Skin:  Negative for rash.  Neurological:  Negative for light-headedness and headaches.  Psychiatric/Behavioral:  Negative for agitation and behavioral problems.   All other systems reviewed and are negative.  Per HPI unless specifically indicated above   Allergies as of 04/15/2021       Reactions   Cephalexin Shortness Of Breath, Rash   Lorazepam Shortness Of Breath, Other (See Comments)   She may be very sensitive to benzo.    Stops breathing         Medication List        Accurate as of April 15, 2021 11:23 AM. If you have any questions,  ask your nurse or doctor.          albuterol 108 (90 Base) MCG/ACT inhaler Commonly known as: VENTOLIN HFA Inhale 2 puffs into the lungs every 6 (six) hours as needed for wheezing or shortness of breath.   aspirin-acetaminophen-caffeine 250-250-65 MG tablet Commonly known as: EXCEDRIN MIGRAINE Take 2 tablets by mouth every 6 (six) hours as needed for headache.   atorvastatin 20 MG tablet Commonly known as: LIPITOR Take 1 tablet (20 mg total) by mouth daily.   carvedilol 3.125 MG tablet Commonly known as: COREG TAKE 1 TABLET BY MOUTH TWICE DAILY WITH A MEAL   HYDROcodone-acetaminophen 5-325 MG tablet Commonly known as: NORCO/VICODIN Take 1 tablet by mouth every 6 (six) hours as needed for moderate pain.   lidocaine 5 % Commonly known as: LIDODERM Place 1 patch onto the skin daily as needed (pain).   lisinopril 2.5 MG tablet Commonly known as: ZESTRIL Take 1 tablet (2.5 mg total) by mouth every morning.   loratadine 10 MG tablet Commonly known as: CLARITIN Take 10 mg by mouth daily as needed for allergies.   nitroGLYCERIN 0.4 MG SL tablet Commonly known as: NITROSTAT Place 1 tablet (0.4 mg total) under the tongue every 5 (five) minutes as needed for chest pain. DISSOLVE ONE TABLET UNDER THE TONGUE EVERY 5 MINUTES AS NEEDED FOR CHEST PAIN.  DO NOT EXCEED A TOTAL OF 3 DOSES  IN 15 MINUTES   pantoprazole 40 MG tablet Commonly known as: PROTONIX Take 40 mg by mouth daily as needed (GERD).   promethazine 25 MG tablet Commonly known as: PHENERGAN Take 1 tablet (25 mg total) by mouth every 8 (eight) hours as needed for nausea or vomiting. Started by: Fransisca Kaufmann Robbert Langlinais, MD   silver sulfADIAZINE 1 % cream Commonly known as: Silvadene Apply 1 application topically daily. Apply to shoulder nightly 7-14 days   Theratears 0.25 % Soln Generic drug: Carboxymethylcellulose Sodium Place 1 drop into both eyes daily as needed (dry eyes).         Objective:   BP 120/74    Pulse 78   Wt 183 lb (83 kg)   LMP 05/18/2011   SpO2 95%   BMI 31.41 kg/m   Wt Readings from Last 3 Encounters:  04/15/21 183 lb (83 kg)  03/31/21 200 lb (90.7 kg)  02/16/21 187 lb 3.2 oz (84.9 kg)    Physical Exam Vitals and nursing note reviewed.  Constitutional:      General: She is not in acute distress.    Appearance: She is well-developed. She is not diaphoretic.  Eyes:     Conjunctiva/sclera: Conjunctivae normal.  Abdominal:     General: Abdomen is flat. There is no distension.     Palpations: Abdomen is soft.     Tenderness: There is abdominal tenderness (Right upper quadrant abdominal tenderness on exam). There is no guarding or rebound.     Hernia: No hernia is present.  Skin:    General: Skin is warm and dry.     Findings: No rash.  Neurological:     Mental Status: She is alert and oriented to person, place, and time.     Coordination: Coordination normal.  Psychiatric:        Behavior: Behavior normal.      Assessment & Plan:   Problem List Items Addressed This Visit   None Visit Diagnoses     Colitis    -  Primary   Relevant Medications   promethazine (PHENERGAN) 25 MG tablet   Other Relevant Orders   CBC with Differential/Platelet   CMP14+EGFR       Patient seems to be doing slightly better, no longer having bleeding, will check blood counts, recommended for her to continue the antibiotic and give her Phenergan to help with. Follow up plan: Return if symptoms worsen or fail to improve.  Counseling provided for all of the vaccine components Orders Placed This Encounter  Procedures   CBC with Differential/Platelet   Aquilla Daxtin Leiker, MD [facility] 04/15/2021, 11:23 AM

## 2021-04-16 LAB — CBC WITH DIFFERENTIAL/PLATELET
Basophils Absolute: 0 10*3/uL (ref 0.0–0.2)
Basos: 0 %
EOS (ABSOLUTE): 0.1 10*3/uL (ref 0.0–0.4)
Eos: 2 %
Hematocrit: 39.8 % (ref 34.0–46.6)
Hemoglobin: 13.6 g/dL (ref 11.1–15.9)
Immature Grans (Abs): 0 10*3/uL (ref 0.0–0.1)
Immature Granulocytes: 0 %
Lymphocytes Absolute: 1.3 10*3/uL (ref 0.7–3.1)
Lymphs: 25 %
MCH: 31.8 pg (ref 26.6–33.0)
MCHC: 34.2 g/dL (ref 31.5–35.7)
MCV: 93 fL (ref 79–97)
Monocytes Absolute: 0.4 10*3/uL (ref 0.1–0.9)
Monocytes: 7 %
Neutrophils Absolute: 3.5 10*3/uL (ref 1.4–7.0)
Neutrophils: 66 %
Platelets: 200 10*3/uL (ref 150–450)
RBC: 4.28 x10E6/uL (ref 3.77–5.28)
RDW: 12.5 % (ref 11.7–15.4)
WBC: 5.3 10*3/uL (ref 3.4–10.8)

## 2021-04-16 LAB — CMP14+EGFR
ALT: 12 IU/L (ref 0–32)
AST: 16 IU/L (ref 0–40)
Albumin/Globulin Ratio: 2.6 — ABNORMAL HIGH (ref 1.2–2.2)
Albumin: 4.5 g/dL (ref 3.8–4.8)
Alkaline Phosphatase: 66 IU/L (ref 44–121)
BUN/Creatinine Ratio: 16 (ref 12–28)
BUN: 12 mg/dL (ref 8–27)
Bilirubin Total: 0.4 mg/dL (ref 0.0–1.2)
CO2: 25 mmol/L (ref 20–29)
Calcium: 9.2 mg/dL (ref 8.7–10.3)
Chloride: 102 mmol/L (ref 96–106)
Creatinine, Ser: 0.76 mg/dL (ref 0.57–1.00)
Globulin, Total: 1.7 g/dL (ref 1.5–4.5)
Glucose: 87 mg/dL (ref 70–99)
Potassium: 4.7 mmol/L (ref 3.5–5.2)
Sodium: 141 mmol/L (ref 134–144)
Total Protein: 6.2 g/dL (ref 6.0–8.5)
eGFR: 89 mL/min/{1.73_m2} (ref >59)

## 2021-04-21 ENCOUNTER — Encounter: Payer: BC Managed Care – PPO | Admitting: Dermatology

## 2021-04-25 ENCOUNTER — Ambulatory Visit: Payer: Self-pay | Admitting: Orthopedic Surgery

## 2021-04-25 ENCOUNTER — Telehealth: Payer: Self-pay | Admitting: Family Medicine

## 2021-04-25 NOTE — H&P (Signed)
[name] is an 60 y.o. female.   Chief Complaint: back and leg pain   HPI: Reason for Visit: low back  Context: The patient is 10 1/2 months out  Location (Lower Extremity): lower back pain on the right; right buttock and hip  Severity: pain level 8/10  Aggravating Factors: sitting for  Associated Symptoms: no numbness/tingling; no weakness; cramping bilateral lower legs  Medications: The patient was prescribed Percocet. She takes Aleve or Excedrin during the day.  Notes:  The patient is 8 weeks following a right S1 SNRB  Patient reports 2 days of relief of pain. And then her pain returns it radiates down into the right leg outer aspect of the foot. 7 to take pain medicine.  Surgery initially cancelled as she tested positive for Covid (asymptomatic). She was rescheduled then had colitis and needed to complete a course of abx for that. She obtained new clearance after completion.      Past Medical History:  Diagnosis Date   Anxiety    Arthritis    back (12/30/2014)   Cerebral cyst    per brain MRI 07-12-2019 unchanged 88mm cyst inferomedial right frontal lobe   Chronic headaches    Chronic systolic (congestive) heart failure ([place])    followed by dr Frances Nickels   Complication of anesthesia    pt is very sensitive to benzodiazepines, pt stated "almost died"   Compression fracture of lumbar vertebra ([place]) 05/27/2020   per pt L3 and L4   DCM (dilated cardiomyopathy) ([place])    w/ Takatsubo--- followed by dr Johnsie Cancel--- stress-induced --- cardiac cath 12-30-2014 ef 30-35%, recovered per cardiac MRI 03-15-2015 ef 64%   Depression    Disorder of mastoid    per brain MRI 07-12-2019 persistant large mastoid effusion   Dysuria    Environmental and seasonal allergies    Frequency of urination    GERD (gastroesophageal reflux disease)    History of asthma    childhood   History of esophageal spasm    History of kidney stones    History of melanoma excision 2001   right supraorbital (right  forehead and upper eyelid ) s/p Moh's procedure w/ sln bx, no recurrence per pt   History of non-ST elevation myocardial infarction (NSTEMI) 12/29/2014   per cath normal coronaries , ef 30-35%--- stress-- induced Takotsubo syndrome    History of palpitations 01/05/2015   STRESS INDUCED   History of parotid cancer 2008   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 s/p right lateral parotidectomy w/ nerve dissection , modified radical neck dissection ; completed x35 fractions raditation 2008; no recurrence per pt   Hyperlipidemia    Hypertension    Kidney cysts    Left ureteral calculus    Low back pain    Melanoma ([place])    14 years ago per patient   Muscle spasm of back    PONV (postoperative nausea and vomiting)    Takotsubo syndrome 12/29/2014   cardiologist--- dr Frances Nickels--- dx NSTEMI -- stress-induced w/ DCM-- per cath 12-30-2014 normal coronaries , ef 30-35%, recovered ef per cardiac MRI ef 64%   Urgency of urination    Wears glasses         Past Surgical History:  Procedure Laterality Date   BREAST BIOPSY Bilateral early 2000's   CARDIAC CATHETERIZATION  2006 (APPROX) [place]   NORMAL   CARDIAC CATHETERIZATION  12/30/2014   CARDIAC CATHETERIZATION N/A 12/30/2014   Procedure: Left Heart Cath and Coronary Angiography;  Surgeon: Wellington Hampshire, MD; Location: [facility]; Service: Cardiovascular; Laterality: N/A;   CARDIOVASCULAR STRESS TEST  03-21-2012 DR Johnsie Cancel   NORMAL NUCLEAR STUDY/ NO ISCHEMIA/ EF 63%   COLONOSCOPY WITH PROPOFOL  05/29/2017   CYSTOSCOPY W/ URETERAL STENT PLACEMENT Left 03/26/2013   Procedure: CYSTOSCOPY WITH RETROGRADE PYELOGRAM ; Surgeon: Molli Hazard, MD; Location: [facility]; Service: Urology; Laterality: Left;   CYSTOSCOPY WITH RETROGRADE PYELOGRAM, URETEROSCOPY AND STENT PLACEMENT Bilateral 03/19/2013   Procedure: CYSTOSCOPY WITH RETROGRADE PYELOGRAM, BILATERAL URETEROSCOPY AND STENT PLACEMENT LEFT  URETER,BILATERAL STONE EXTRACTION , HOLMIUM LASER LEFT URETER; Surgeon: Molli Hazard, MD; Location: [facility]; Service: Urology; Laterality: Bilateral;   CYSTOSCOPY WITH STENT PLACEMENT Left 03/26/2013   Procedure: CYSTOSCOPY WITH STENT PLACEMENT; Surgeon: Molli Hazard, MD; Location: [facility]; Service: Urology; Laterality: Left;   CYSTOSCOPY/URETEROSCOPY/HOLMIUM LASER/STENT PLACEMENT Left 07/07/2020   Procedure: CYSTOSCOPY LEFT URETEROSCOPY/HOLMIUM LASER/STENT PLACEMENT; Surgeon: Lucas Mallow, MD; Location[facility]; Service: Urology; Laterality: Left;   DIAGNOSTIC LAPAROSCOPY  04-12-2009   ESOPHAGOGASTRODUODENOSCOPY (EGD) WITH PROPOFOL N/A 02/10/2016   Procedure: ESOPHAGOGASTRODUODENOSCOPY (EGD) WITH PROPOFOL; Surgeon: Ronald Lobo, MD; Location: [facility]; Service: Endoscopy; Laterality: N/A;   KIDNEY SURGERY  1966   BILATERAL URETER'S DILATATION   LUMBAR LAMINECTOMY/DECOMPRESSION MICRODISCECTOMY  05/18/2011   Procedure: LUMBAR LAMINECTOMY/DECOMPRESSION MICRODISCECTOMY; Surgeon: Johnn Hai; Location: [facility]; Service: Orthopedics; Laterality: Right; Decompression Lumbar 4-Lumbar 5 Right (xray)    MELANOMA EXCISION WITH SENTINEL LYMPH NODE BIOPSY  2001   moh's procedure/ RIGHT FOREHEAD AND UPPER EYEBROW   RIGHT LATERAL PAROTIDECTOMY W/ NERVE DISSECTION / RIGHT MODIFIED RADICAL NECK DISSECTION SPARING SCM ELEVENTH NERVE AND INTERNAL JUGULAR VEIN  09-12-2006 DR DWIGHT BATES   DR DWIGHT BATES; "inside gland; lots of lymph nodes"        Family History  Problem Relation Age of Onset   Hypertension Mother    COPD Mother    Cancer Mother    breast   Dementia Mother    Heart disease Father    Cancer Father    Colorectal   Hyperlipidemia Father    Hypertension Father    Social History: reports that she has never smoked. She has never used smokeless tobacco. She reports that she does not currently use alcohol. She reports that she  does not use drugs.  Allergies:       Allergies  Allergen Reactions   Cephalexin Shortness Of Breath and Rash   Lorazepam Shortness Of Breath and Other (See Comments)    She may be very sensitive to benzo.    Medications:  atorvastatin 20 mg tablet  carvediloL 3.125 mg tablet  Ciprodex 0.3 %-0.1 % ear drops,suspension  Lidoderm 5 % topical patch  lisinopriL 2.5 mg tablet  nitroglycerin 0.4 mg sublingual tablet  omeprazole  oxyCODONE 5 mg tablet  Review of Systems  Constitutional: Negative.  HENT: Negative.  Eyes: Negative.  Respiratory: Negative.  Cardiovascular: Negative.  Gastrointestinal: Negative.  Endocrine: Negative.  Genitourinary: Negative.  Musculoskeletal: Positive for back pain, gait problem and myalgias.  Allergic/Immunologic: Negative.  Neurological: Positive for weakness and numbness.  Last menstrual period 05/18/2011.  Physical Exam  Constitutional:  Appearance: Normal appearance.  HENT:  Head: Normocephalic.  Right Ear: External ear normal.  Left Ear: External ear normal.  Nose: Nose normal.  Mouth/Throat:  Pharynx: Oropharynx is clear.  Eyes:  Conjunctiva/sclera: Conjunctivae normal.  Cardiovascular:  Rate and Rhythm: Normal rate.  Pulses: Normal pulses.  Pulmonary:  Effort:  Pulmonary effort is normal.  Breath sounds: Normal breath sounds.  Abdominal:  General: Bowel sounds are normal.  Musculoskeletal:  Cervical back: Normal range of motion.  Comments: Gait and Station: Appearance: ambulating with no assistive devices and antalgic gait.  Constitutional: General Appearance: healthy-appearing and distress (mild).  Psychiatric: Mood and Affect: active and alert.  Cardiovascular System: Edema Right: none; Dorsalis and posterior tibial pulses 2+. Edema Left: none.  Abdomen: Inspection and Palpation: non-distended and no tenderness.  Skin: Inspection and palpation: no rash.  Lumbar Spine: Inspection: normal alignment. Bony Palpation of the Lumbar  Spine: tender at lumbosacral junction.. Bony Palpation of the Right Hip: no tenderness of the greater trochanter and tenderness of the SI joint; Pelvis stable. Bony Palpation of the Left Hip: no tenderness of the greater trochanter and tenderness of the SI joint. Soft Tissue Palpation on the Right: No flank pain with percussion. Active Range of Motion: limited flexion and extention.  Motor Strength: L1 Motor Strength on the Right: hip flexion iliopsoas 5/5. L1 Motor Strength on the Left: hip flexion iliopsoas 5/5. L2-L4 Motor Strength on the Right: knee extension quadriceps 5/5. L2-L4 Motor Strength on the Left: knee extension quadriceps 5/5. L5 Motor Strength on the Right: ankle dorsiflexion tibialis anterior 5/5 and great toe extension extensor hallucis longus 5/5. L5 Motor Strength on the Left: ankle dorsiflexion tibialis anterior 5/5 and great toe extension extensor hallucis longus 5/5. S1 Motor Strength on the Right: plantar flexion gastrocnemius 5/5. S1 Motor Strength on the Left: plantar flexion gastrocnemius 5/5.  Neurological System: Knee Reflex Right: normal (2). Knee Reflex Left: normal (2). Ankle Reflex Right: normal (2). Ankle Reflex Left: normal (2). Babinski Reflex Right: plantar reflex absent. Babinski Reflex Left: plantar reflex absent. Sensation on the Right: normal distal extremities. Sensation on the Left: normal distal extremities. Special Tests on the Right: no clonus of the ankle/knee and seated straight leg raising test positive. Special Tests on the Left: no clonus of the ankle/knee.  Trace EHL weakness in the right compared to the left.  Skin:  General: Skin is warm and dry.  Neurological:  Mental Status: She is alert.   X-rays of the lumbar spine demonstrate a mild levorotatory scoliosis. Osteopenia. Disc degeneration L5-S1. Degenerative changes L3-4 L2-3 and the lower thoracic segments. L3-L4 compression fracture without change.  MRI demonstrated in March a subacute fracture at  L3 and possibly L4. 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 Temporary relief from 2 epidural steroid injections at L5-S1. History of lumbar decompression   Plan:  We discussed options that include living with her symptoms with activity modification pain management etc. I do not believe any further epidurals will be of therapeutic benefit giving the temporary relief from 2 epidurals on the right L4-5  Other option would be to perform a decompression at L5-S1. Unilaterally or bilateral.  She would like to proceed with that.  I had an extensive discussion with the patient concerning the pathology relevant anatomy and treatment options. At this point exhausting conservative treatment and in the presence of a neurologic deficit we discussed microlumbar decompression. I discussed the risks and benefits including bleeding, infection, DVT, PE, anesthetic complications, worsening in their symptoms, improvement in their symptoms, C SF leakage, epidural fibrosis, need  for future surgeries such as revision discectomy and lumbar fusion. I also indicated that this is an operation to basically decompress the nerve root to allow recovery as opposed to fixing a herniated disc if iy is encountered and that the incidence of recurrent chest disc herniation can approach 15%. Also that nerve root recovery is variable and may not recover completely.  I discussed the operative course including overnight in the hospital. Immediate ambulation. Follow-up in 2 weeks for suture removal. 6 weeks until healing of the herniation followed by 6 weeks of reconditioning and strengthening of the core musculature. Also discussed the need to employ the concepts  of disc pressure management and core motion following the surgery to minimize the risk of recurrent disc herniation. We will obtain preoperative clearance i if necessary and proceed accordingly.  Patient asked for pain medicine  A prescription for an opioid was given to be taken as directed for pain control. I discussed the risks including cognitive changes which may affect the ability to operate machinery and to drive. In addition the side effect of constipation as well as to avoid taking with conflicting medications that were discussed.  The patient's prescription drug monitoring report was reviewed with no red flags noted.  She is to follow-up with a medical physician for treatment of presumed osteoporosis.   Plan microlumbar decompression L5-S1   Cecilie Kicks, PA-C for Dr Tonita Cong

## 2021-04-25 NOTE — Telephone Encounter (Signed)
Pt called requesting to speak with [facility] regarding her FMLA. Says her job needs to know if [employer] received paperwork that they faxed to her for the [facility] and also says that they need OV notes (last notes when saw Dr Building control surveyor)  for the [employer].  Please advise and call patient.

## 2021-04-25 NOTE — Telephone Encounter (Signed)
Pt aware ppw on Michelle's desk and we discussed which OV notes they want, and release was done with last 3 visits

## 2021-04-25 NOTE — H&P (View-Only) (Signed)
[name] is an 60 y.o. female.   Chief Complaint: back and leg pain   HPI: Reason for Visit: low back  Context: The patient is 10 1/2 months out  Location (Lower Extremity): lower back pain on the right; right buttock and hip  Severity: pain level 8/10  Aggravating Factors: sitting for  Associated Symptoms: no numbness/tingling; no weakness; cramping bilateral lower legs  Medications: The patient was prescribed Percocet. She takes Aleve or Excedrin during the day.  Notes:  The patient is 8 weeks following a right S1 SNRB  Patient reports 2 days of relief of pain. And then her pain returns it radiates down into the right leg outer aspect of the foot. 7 to take pain medicine.  Surgery initially cancelled as she tested positive for Covid (asymptomatic). She was rescheduled then had colitis and needed to complete a course of abx for that. She obtained new clearance after completion.      Past Medical History:  Diagnosis Date   Anxiety    Arthritis    back (12/30/2014)   Cerebral cyst    per brain MRI 07-12-2019 unchanged 70mm cyst inferomedial right frontal lobe   Chronic headaches    Chronic systolic (congestive) heart failure ([place])    followed by dr Frances Nickels   Complication of anesthesia    pt is very sensitive to benzodiazepines, pt stated "almost died"   Compression fracture of lumbar vertebra ([place]) 05/27/2020   per pt L3 and L4   DCM (dilated cardiomyopathy) ([place])    w/ Takatsubo--- followed by dr Johnsie Cancel--- stress-induced --- cardiac cath 12-30-2014 ef 30-35%, recovered per cardiac MRI 03-15-2015 ef 64%   Depression    Disorder of mastoid    per brain MRI 07-12-2019 persistant large mastoid effusion   Dysuria    Environmental and seasonal allergies    Frequency of urination    GERD (gastroesophageal reflux disease)    History of asthma    childhood   History of esophageal spasm    History of kidney stones    History of melanoma excision 2001   right supraorbital (right  forehead and upper eyelid ) s/p Moh's procedure w/ sln bx, no recurrence per pt   History of non-ST elevation myocardial infarction (NSTEMI) 12/29/2014   per cath normal coronaries , ef 30-35%--- stress-- induced Takotsubo syndrome    History of palpitations 01/05/2015   STRESS INDUCED   History of parotid cancer 2008   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 s/p right lateral parotidectomy w/ nerve dissection , modified radical neck dissection ; completed x35 fractions raditation 2008; no recurrence per pt   Hyperlipidemia    Hypertension    Kidney cysts    Left ureteral calculus    Low back pain    Melanoma ([place])    14 years ago per patient   Muscle spasm of back    PONV (postoperative nausea and vomiting)    Takotsubo syndrome 12/29/2014   cardiologist--- dr Frances Nickels--- dx NSTEMI -- stress-induced w/ DCM-- per cath 12-30-2014 normal coronaries , ef 30-35%, recovered ef per cardiac MRI ef 64%   Urgency of urination    Wears glasses         Past Surgical History:  Procedure Laterality Date   BREAST BIOPSY Bilateral early 2000's   CARDIAC CATHETERIZATION  2006 (APPROX) [place]   NORMAL   CARDIAC CATHETERIZATION  12/30/2014   CARDIAC CATHETERIZATION N/A 12/30/2014   Procedure: Left Heart Cath and Coronary Angiography;  Surgeon: Wellington Hampshire, MD; Location: [facility]; Service: Cardiovascular; Laterality: N/A;   CARDIOVASCULAR STRESS TEST  03-21-2012 DR Johnsie Cancel   NORMAL NUCLEAR STUDY/ NO ISCHEMIA/ EF 63%   COLONOSCOPY WITH PROPOFOL  05/29/2017   CYSTOSCOPY W/ URETERAL STENT PLACEMENT Left 03/26/2013   Procedure: CYSTOSCOPY WITH RETROGRADE PYELOGRAM ; Surgeon: Molli Hazard, MD; Location: [facility]; Service: Urology; Laterality: Left;   CYSTOSCOPY WITH RETROGRADE PYELOGRAM, URETEROSCOPY AND STENT PLACEMENT Bilateral 03/19/2013   Procedure: CYSTOSCOPY WITH RETROGRADE PYELOGRAM, BILATERAL URETEROSCOPY AND STENT PLACEMENT LEFT  URETER,BILATERAL STONE EXTRACTION , HOLMIUM LASER LEFT URETER; Surgeon: Molli Hazard, MD; Location: [facility]; Service: Urology; Laterality: Bilateral;   CYSTOSCOPY WITH STENT PLACEMENT Left 03/26/2013   Procedure: CYSTOSCOPY WITH STENT PLACEMENT; Surgeon: Molli Hazard, MD; Location: [facility]; Service: Urology; Laterality: Left;   CYSTOSCOPY/URETEROSCOPY/HOLMIUM LASER/STENT PLACEMENT Left 07/07/2020   Procedure: CYSTOSCOPY LEFT URETEROSCOPY/HOLMIUM LASER/STENT PLACEMENT; Surgeon: Lucas Mallow, MD; Location: [facility]; Service: Urology; Laterality: Left;   DIAGNOSTIC LAPAROSCOPY  04-12-2009   ESOPHAGOGASTRODUODENOSCOPY (EGD) WITH PROPOFOL N/A 02/10/2016   Procedure: ESOPHAGOGASTRODUODENOSCOPY (EGD) WITH PROPOFOL; Surgeon: Ronald Lobo, MD; Location: [facility]; Service: Endoscopy; Laterality: N/A;   KIDNEY SURGERY  1966   BILATERAL URETER'S DILATATION   LUMBAR LAMINECTOMY/DECOMPRESSION MICRODISCECTOMY  05/18/2011   Procedure: LUMBAR LAMINECTOMY/DECOMPRESSION MICRODISCECTOMY; Surgeon: Johnn Hai; Location: [facility]; Service: Orthopedics; Laterality: Right; Decompression Lumbar 4-Lumbar 5 Right (xray)    MELANOMA EXCISION WITH SENTINEL LYMPH NODE BIOPSY  2001   moh's procedure/ RIGHT FOREHEAD AND UPPER EYEBROW   RIGHT LATERAL PAROTIDECTOMY W/ NERVE DISSECTION / RIGHT MODIFIED RADICAL NECK DISSECTION SPARING SCM ELEVENTH NERVE AND INTERNAL JUGULAR VEIN  09-12-2006 DR DWIGHT BATES   DR DWIGHT BATES; "inside gland; lots of lymph nodes"        Family History  Problem Relation Age of Onset   Hypertension Mother    COPD Mother    Cancer Mother    breast   Dementia Mother    Heart disease Father    Cancer Father    Colorectal   Hyperlipidemia Father    Hypertension Father    Social History: reports that she has never smoked. She has never used smokeless tobacco. She reports that she does not currently use alcohol. She reports that she  does not use drugs.  Allergies:       Allergies  Allergen Reactions   Cephalexin Shortness Of Breath and Rash   Lorazepam Shortness Of Breath and Other (See Comments)    She may be very sensitive to benzo.    Medications:  atorvastatin 20 mg tablet  carvediloL 3.125 mg tablet  Ciprodex 0.3 %-0.1 % ear drops,suspension  Lidoderm 5 % topical patch  lisinopriL 2.5 mg tablet  nitroglycerin 0.4 mg sublingual tablet  omeprazole  oxyCODONE 5 mg tablet  Review of Systems  Constitutional: Negative.  HENT: Negative.  Eyes: Negative.  Respiratory: Negative.  Cardiovascular: Negative.  Gastrointestinal: Negative.  Endocrine: Negative.  Genitourinary: Negative.  Musculoskeletal: Positive for back pain, gait problem and myalgias.  Allergic/Immunologic: Negative.  Neurological: Positive for weakness and numbness.  Last menstrual period 05/18/2011.  Physical Exam  Constitutional:  Appearance: Normal appearance.  HENT:  Head: Normocephalic.  Right Ear: External ear normal.  Left Ear: External ear normal.  Nose: Nose normal.  Mouth/Throat:  Pharynx: Oropharynx is clear.  Eyes:  Conjunctiva/sclera: Conjunctivae normal.  Cardiovascular:  Rate and Rhythm: Normal rate.  Pulses: Normal pulses.  Pulmonary:  Effort:  Pulmonary effort is normal.  Breath sounds: Normal breath sounds.  Abdominal:  General: Bowel sounds are normal.  Musculoskeletal:  Cervical back: Normal range of motion.  Comments: Gait and Station: Appearance: ambulating with no assistive devices and antalgic gait.  Constitutional: General Appearance: healthy-appearing and distress (mild).  Psychiatric: Mood and Affect: active and alert.  Cardiovascular System: Edema Right: none; Dorsalis and posterior tibial pulses 2+. Edema Left: none.  Abdomen: Inspection and Palpation: non-distended and no tenderness.  Skin: Inspection and palpation: no rash.  Lumbar Spine: Inspection: normal alignment. Bony Palpation of the Lumbar  Spine: tender at lumbosacral junction.. Bony Palpation of the Right Hip: no tenderness of the greater trochanter and tenderness of the SI joint; Pelvis stable. Bony Palpation of the Left Hip: no tenderness of the greater trochanter and tenderness of the SI joint. Soft Tissue Palpation on the Right: No flank pain with percussion. Active Range of Motion: limited flexion and extention.  Motor Strength: L1 Motor Strength on the Right: hip flexion iliopsoas 5/5. L1 Motor Strength on the Left: hip flexion iliopsoas 5/5. L2-L4 Motor Strength on the Right: knee extension quadriceps 5/5. L2-L4 Motor Strength on the Left: knee extension quadriceps 5/5. L5 Motor Strength on the Right: ankle dorsiflexion tibialis anterior 5/5 and great toe extension extensor hallucis longus 5/5. L5 Motor Strength on the Left: ankle dorsiflexion tibialis anterior 5/5 and great toe extension extensor hallucis longus 5/5. S1 Motor Strength on the Right: plantar flexion gastrocnemius 5/5. S1 Motor Strength on the Left: plantar flexion gastrocnemius 5/5.  Neurological System: Knee Reflex Right: normal (2). Knee Reflex Left: normal (2). Ankle Reflex Right: normal (2). Ankle Reflex Left: normal (2). Babinski Reflex Right: plantar reflex absent. Babinski Reflex Left: plantar reflex absent. Sensation on the Right: normal distal extremities. Sensation on the Left: normal distal extremities. Special Tests on the Right: no clonus of the ankle/knee and seated straight leg raising test positive. Special Tests on the Left: no clonus of the ankle/knee.  Trace EHL weakness in the right compared to the left.  Skin:  General: Skin is warm and dry.  Neurological:  Mental Status: She is alert.   X-rays of the lumbar spine demonstrate a mild levorotatory scoliosis. Osteopenia. Disc degeneration L5-S1. Degenerative changes L3-4 L2-3 and the lower thoracic segments. L3-L4 compression fracture without change.  MRI demonstrated in March a subacute fracture at  L3 and possibly L4. 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 Temporary relief from 2 epidural steroid injections at L5-S1. History of lumbar decompression   Plan:  We discussed options that include living with her symptoms with activity modification pain management etc. I do not believe any further epidurals will be of therapeutic benefit giving the temporary relief from 2 epidurals on the right L4-5  Other option would be to perform a decompression at L5-S1. Unilaterally or bilateral.  She would like to proceed with that.  I had an extensive discussion with the patient concerning the pathology relevant anatomy and treatment options. At this point exhausting conservative treatment and in the presence of a neurologic deficit we discussed microlumbar decompression. I discussed the risks and benefits including bleeding, infection, DVT, PE, anesthetic complications, worsening in their symptoms, improvement in their symptoms, C SF leakage, epidural fibrosis, need  for future surgeries such as revision discectomy and lumbar fusion. I also indicated that this is an operation to basically decompress the nerve root to allow recovery as opposed to fixing a herniated disc if iy is encountered and that the incidence of recurrent chest disc herniation can approach 15%. Also that nerve root recovery is variable and may not recover completely.  I discussed the operative course including overnight in the hospital. Immediate ambulation. Follow-up in 2 weeks for suture removal. 6 weeks until healing of the herniation followed by 6 weeks of reconditioning and strengthening of the core musculature. Also discussed the need to employ the concepts  of disc pressure management and core motion following the surgery to minimize the risk of recurrent disc herniation. We will obtain preoperative clearance i if necessary and proceed accordingly.  Patient asked for pain medicine  A prescription for an opioid was given to be taken as directed for pain control. I discussed the risks including cognitive changes which may affect the ability to operate machinery and to drive. In addition the side effect of constipation as well as to avoid taking with conflicting medications that were discussed.  The patient's prescription drug monitoring report was reviewed with no red flags noted.  She is to follow-up with a medical physician for treatment of presumed osteoporosis.   Plan microlumbar decompression L5-S1   Cecilie Kicks, PA-C for Dr Tonita Cong

## 2021-04-27 NOTE — Telephone Encounter (Signed)
LMOV[employer] FMLA ppw was faxed to [facility] and I received a confirmation that it went through

## 2021-05-02 NOTE — Progress Notes (Signed)
Surgical Instructions    Your procedure is scheduled on 05/06/21.  Report to [facility] Main Entrance "A" at 11:00 A.M., then check in with the Admitting office.  Call this number if you have problems the morning of surgery:  [phone]   If you have any questions prior to your surgery date call [phone]: Open Monday-Friday 8am-4pm    Remember:  Do not eat after midnight the night before your surgery  You may drink clear liquids until 10:00 the morning of your surgery.   Clear liquids allowed are: Water, Non-Citrus Juices (without pulp), Carbonated Beverages, Clear Tea, Black Coffee ONLY (NO MILK, CREAM OR POWDERED CREAMER of any kind), and Gatorade  Please complete your PRE-SURGERY ENSURE that was provided to you by 11:00 am the morning of surgery.  Please, if able, drink it in one setting. DO NOT SIP.     Take these medicines the morning of surgery with A SIP OF WATER: atorvastatin (LIPITOR)  carvedilol (COREG)   As Needed: acetaminophen (TYLENOL) nitroGLYCERIN (NITROSTAT) oxyCODONE-acetaminophen (PERCOCET/ROXICET)  pantoprazole (PROTONIX)   As of today, STOP taking any Aspirin (unless otherwise instructed by your surgeon) Aleve, Naproxen, Ibuprofen, Motrin, Advil, Goody's, BC's, all herbal medications, fish oil, and all vitamins.     After your COVID test   You are not required to quarantine however you are required to wear a well-fitting mask when you are out and around people not in your household.  If your mask becomes wet or soiled, replace with a new one.  Wash your hands often with soap and water for 20 seconds or clean your hands with an alcohol-based hand sanitizer that contains at least 60% alcohol.  Do not share personal items.  Notify your provider: if you are in close contact with someone who has COVID  or if you develop a fever of 100.4 or greater, sneezing, cough, sore throat, shortness of breath or body aches.           Do not wear jewelry  Do  not wear lotions, powders, colognes, or deodorant. Do not shave 48 hours prior to surgery.  Do not bring valuables to the hospital. DO Not wear nail polish, gel polish, artificial nails, or any other type of covering on natural nails including finger and toenails. If patients have artificial nails, gel coating, etc. that need to be removed by a nail salon, please have this removed prior to surgery or surgery may need to be canceled/delayed if the surgeon/ anesthesia feels like the patient is unable to be adequately monitored.             [facility] is not responsible for any belongings or valuables.  Do NOT Smoke (Tobacco/Vaping)  24 hours prior to your procedure  If you use a CPAP at night, you may bring your mask for your overnight stay.   Contacts, glasses, hearing aids, dentures or partials may not be worn into surgery, please bring cases for these belongings   For patients admitted to the hospital, discharge time will be determined by your treatment team.   Patients discharged the day of surgery will not be allowed to drive home, and someone needs to stay with them for 24 hours.  NO VISITORS WILL BE ALLOWED IN PRE-OP WHERE PATIENTS ARE PREPPED FOR SURGERY.  ONLY 1 SUPPORT PERSON MAY BE PRESENT IN THE WAITING ROOM WHILE YOU ARE IN SURGERY.  IF YOU ARE TO BE ADMITTED, ONCE YOU ARE IN YOUR ROOM YOU WILL BE ALLOWED TWO (2)  VISITORS. 1 (ONE) VISITOR MAY STAY OVERNIGHT BUT MUST ARRIVE TO THE ROOM BY 8pm.  Minor children may have two parents present. Special consideration for safety and communication needs will be reviewed on a case by case basis.  Special instructions:    Oral Hygiene is also important to reduce your risk of infection.  Remember - BRUSH YOUR TEETH THE MORNING OF SURGERY WITH YOUR REGULAR TOOTHPASTE   [facility]- Preparing For Surgery  Before surgery, you can play an important role. Because skin is not sterile, your skin needs to be as free of germs as possible. You can  reduce the number of germs on your skin by washing with CHG (chlorahexidine gluconate) Soap before surgery.  CHG is an antiseptic cleaner which kills germs and bonds with the skin to continue killing germs even after washing.     Please do not use if you have an allergy to CHG or antibacterial soaps. If your skin becomes reddened/irritated stop using the CHG.  Do not shave (including legs and underarms) for at least 48 hours prior to first CHG shower. It is OK to shave your face.  Please follow these instructions carefully.     Shower the NIGHT BEFORE SURGERY and the MORNING OF SURGERY with CHG Soap.   If you chose to wash your hair, wash your hair first as usual with your normal shampoo. After you shampoo, rinse your hair and body thoroughly to remove the shampoo.  Then [employer] and genitals (private parts) with your normal soap and rinse thoroughly to remove soap.  After that Use CHG Soap as you would any other liquid soap. You can apply CHG directly to the skin and wash gently with a scrungie or a clean washcloth.   Apply the CHG Soap to your body ONLY FROM THE NECK DOWN.  Do not use on open wounds or open sores. Avoid contact with your eyes, ears, mouth and genitals (private parts). Wash Face and genitals (private parts)  with your normal soap.   Wash thoroughly, paying special attention to the area where your surgery will be performed.  Thoroughly rinse your body with warm water from the neck down.  DO NOT shower/wash with your normal soap after using and rinsing off the CHG Soap.  Pat yourself dry with a CLEAN TOWEL.  Wear CLEAN PAJAMAS to bed the night before surgery  Place CLEAN SHEETS on your bed the night before your surgery  DO NOT SLEEP WITH PETS.   Day of Surgery:  Take a shower with CHG soap. Wear Clean/Comfortable clothing the morning of surgery Do not apply any deodorants/lotions.   Remember to brush your teeth WITH YOUR REGULAR TOOTHPASTE.   Please read over  the following fact sheets that you were given.

## 2021-05-03 ENCOUNTER — Encounter (HOSPITAL_COMMUNITY)
Admission: RE | Admit: 2021-05-03 | Discharge: 2021-05-03 | Disposition: A | Discharge status: Home / Self Care | Payer: BC Managed Care – PPO | Source: Ambulatory Visit | Attending: Specialist | Admitting: Specialist

## 2021-05-03 ENCOUNTER — Other Ambulatory Visit: Payer: Self-pay

## 2021-05-03 ENCOUNTER — Encounter (HOSPITAL_COMMUNITY): Payer: Self-pay

## 2021-05-03 ENCOUNTER — Ambulatory Visit (HOSPITAL_COMMUNITY)
Admission: RE | Admit: 2021-05-03 | Discharge: 2021-05-03 | Disposition: A | Discharge status: Home / Self Care | Payer: BC Managed Care – PPO | Source: Ambulatory Visit | Attending: Orthopedic Surgery | Admitting: Orthopedic Surgery

## 2021-05-03 VITALS — BP 113/62 | HR 75 | Temp 98.0°F | Resp 17 | Ht 64.0 in | Wt 182.0 lb

## 2021-05-03 DIAGNOSIS — M5126 Other intervertebral disc displacement, lumbar region: Secondary | ICD-10-CM

## 2021-05-03 DIAGNOSIS — Z01818 Encounter for other preprocedural examination: Secondary | ICD-10-CM | POA: Diagnosis not present

## 2021-05-03 DIAGNOSIS — M48062 Spinal stenosis, lumbar region with neurogenic claudication: Secondary | ICD-10-CM | POA: Diagnosis not present

## 2021-05-03 DIAGNOSIS — M2578 Osteophyte, vertebrae: Secondary | ICD-10-CM | POA: Diagnosis not present

## 2021-05-03 HISTORY — DX: Pneumonia, unspecified organism: J18.9

## 2021-05-03 LAB — CBC
HCT: 39.2 % (ref 36.0–46.0)
Hemoglobin: 13.2 g/dL (ref 12.0–15.0)
MCH: 31.7 pg (ref 26.0–34.0)
MCHC: 33.7 g/dL (ref 30.0–36.0)
MCV: 94 fL (ref 80.0–100.0)
Platelets: 169 10*3/uL (ref 150–400)
RBC: 4.17 MIL/uL (ref 3.87–5.11)
RDW: 12.1 % (ref 11.5–15.5)
WBC: 5.4 10*3/uL (ref 4.0–10.5)
nRBC: 0 % (ref 0.0–0.2)

## 2021-05-03 LAB — BASIC METABOLIC PANEL
Anion gap: 7 (ref 5–15)
BUN: 13 mg/dL (ref 8–23)
CO2: 27 mmol/L (ref 22–32)
Calcium: 9.2 mg/dL (ref 8.9–10.3)
Chloride: 104 mmol/L (ref 98–111)
Creatinine, Ser: 0.79 mg/dL (ref 0.44–1.00)
GFR, Estimated: >60 mL/min (ref >60)
Glucose, Bld: 93 mg/dL (ref 70–99)
Potassium: 4 mmol/L (ref 3.5–5.1)
Sodium: 138 mmol/L (ref 135–145)

## 2021-05-03 LAB — SURGICAL PCR SCREEN
MRSA, PCR: NEGATIVE
Staphylococcus aureus: NEGATIVE

## 2021-05-03 NOTE — Progress Notes (Signed)
PCP - Dettinger, Vonna Kotyk, MD Cardiologist - Jenkins Rouge, MD  PPM/ICD - denies Device Orders - n/a Rep Notified - n/a  Chest x-ray - 08/20/2019 EKG - 07/07/2020 Stress Test - 03/27/2012 ECHO - 01/06/2015 Cardiac Cath - 12/30/2014  Sleep Study - denies CPAP - n/a  Fasting Blood Sugar - n/a  Blood Thinner Instructions: n/a  Aspirin Instructions: Patient was instructed: As of today, STOP taking any Aspirin (unless otherwise instructed by your surgeon) Aleve, Naproxen, Ibuprofen, Motrin, Advil, Goody's, BC's, all herbal medications, fish oil, and all vitamins.    ERAS Protcol - yes  PRE-SURGERY Ensure - yes   COVID TEST- no, patient was tested positive for COVID on 03/31/2021   Anesthesia review: yes - cardiac history  Patient denies shortness of breath, fever, cough and chest pain at PAT appointment   All instructions explained to the patient, with a verbal understanding of the material. Patient agrees to go over the instructions while at home for a better understanding. Patient also instructed to self quarantine after being tested for COVID-19. The opportunity to ask questions was provided.

## 2021-05-04 NOTE — Progress Notes (Signed)
Anesthesia Chart Review:   Case: [id number] Date/Time: 05/06/21 1245   Procedure: Microlumbar decompression L5-S1 - 3 C-bed 90 mins   Anesthesia type: General   Pre-op diagnosis: Spinal Stenosis L5-S1   Location: [facility] OR ROOM 04 / [facility]   Surgeons: Susa Day, MD       DISCUSSION: Pt is 60 years old with hx Takatsubo cardiomyopathy (2016), CHF, HTN, palpitations, asthma   Anesthesia history includes: sensitive to benzodiazepines, pt stated "almost died"  Surgery was originally scheduled for 03/31/21 but cancelled day of surgery as pt was positive for COVID.   S/p colitis 04/11/21 - seen in ED in [place] (notes in care everywhere) for colitis, rectal bleeding. Tx for infectious colitis with Cipro and flagyl   VS: BP 113/62   Pulse 75   Temp 36.7 C   Resp 17   Ht 5\' 4"  (1.626 m)   Wt 82.6 kg   LMP 05/18/2011   SpO2 97%   BMI 31.24 kg/m   PROVIDERS: - PCP is Dettinger, Fransisca Kaufmann, MD - Cardiologist is Jenkins Rouge, MD. Last office visit 01/17/21. Cleared for surgery at acceptable risk by Sande Rives, PA on 02/17/21    LABS: Labs reviewed: Acceptable for surgery. (all labs ordered are listed, but only abnormal results are displayed)  Labs Reviewed  SURGICAL PCR SCREEN  CBC  BASIC METABOLIC PANEL     IMAGES: CTA abd pelvis 04/11/21 (care everywhere): VASCULAR  1. No active contrast extravasation or otherwise CTA evidence of brisk bleeding within the abdomen or pelvis.  2. Mildly-enhancing internal and external hemorrhoids. In the absence of additional findings, these are likely the etiology of the patient's reported rectal bleeding.  3. Incidental, large LEFT anterior thigh venous varix. Findings suspicious for venous insufficiency. Consider referral to vascular interventional radiology for further evaluation.  NON-VASCULAR  1. Nonspecific descending colon and sigmoid mucosal thickening and pericolonic stranding. Findings likely to represent acute, uncomplicated  colitis.     EKG 06/27/20: NSR. Low voltage QRS. LAFB. Anterior/anterolateral Twave inversion, possible ischemia pattern     CV: Cardiac MRI 02/2015: 1) Normal LV size and function 2) Quantitative EF 64% no RWMA;s 3) No scar tissue or hyperenhancement - Overall findings consistent with Takatsubo DCM     Echo 01/06/15:  - Left ventricle: The cavity size was normal. Systolic function was mildly reduced. The estimated ejection fraction was 45%. Diffuse hypokinesis. Doppler parameters are consistent with abnormal left ventricular relaxation (grade 1 diastolic dysfunction). There was no evidence of elevated ventricular filling pressure by Doppler parameters.  - Aortic valve: Trileaflet; normal thickness leaflets. There was trivial regurgitation.  - Aortic root: The aortic root was normal in size.  - Mitral valve: Structurally normal valve. There was no regurgitation.  - Left atrium: The atrium was normal in size.  - Right ventricle: Systolic function was normal.  - Right atrium: The atrium was normal in size.  - Tricuspid valve: There was trivial regurgitation.  - Pulmonic valve: There was no regurgitation.  - Pulmonary arteries: Systolic pressure was within the normal range.  - Inferior vena cava: The vessel was normal in size.  - Pericardium, extracardiac: There was no pericardial effusion.      Cardiac cath 12/30/14:  1. Normal coronary arteries. 2. Moderately to severely reduced LV systolic function with an ejection fraction of 30-35% with severe hypokinesis of the mid distal anterior, apical and mid to distal inferior walls consistent with stress-induced cardiomyopathy. 3. Mildly elevated left ventricular end-diastolic pressure.  Past Medical History:  Diagnosis Date   Anxiety    Arthritis    back (12/30/2014)   Asthma    as a child, no problems as an adult, has an albuterol inhaler   Cerebral cyst    per brain MRI 07-12-2019 unchanged 59mm cyst inferomedial right frontal lobe    Chronic headaches    Chronic systolic (congestive) heart failure ([place])    followed by dr Frances Nickels   Complication of anesthesia    pt is very sensitive to benzodiazepines, pt stated "almost died"   Compression fracture of lumbar vertebra ([place]) 05/27/2020   per pt L3 and L4   DCM (dilated cardiomyopathy) ([place])    w/ Takatsubo---  followed by dr Johnsie Cancel---  stress-induced --- cardiac cath 12-30-2014 ef 30-35%, recovered per cardiac MRI 03-15-2015 ef  64%   Depression    Disorder of mastoid    per brain MRI 07-12-2019 persistant large mastoid effusion   Dysuria    Environmental and seasonal allergies    Frequency of urination    GERD (gastroesophageal reflux disease)    History of asthma    childhood   History of esophageal spasm    History of kidney stones    History of melanoma excision 2001   right supraorbital (right forehead and upper eyelid )  s/p  Moh's procedure w/ sln bx,  no recurrence per pt   History of non-ST elevation myocardial infarction (NSTEMI) 12/29/2014   per cath normal coronaries , ef 30-35%---  stress-- induced Takotsubo syndrome    History of palpitations 01/05/2015   STRESS INDUCED   History of parotid cancer 2008   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-2006 s/p  right lateral parotidectomy w/ nerve dissection , modified radical neck dissection ;  completed  x35 fractions raditation 2008;  no recurrence per pt   Hyperlipidemia    Hypertension    Kidney cysts    Left ureteral calculus    Low back pain    Melanoma ([place]) 2001   face   Muscle spasm of back    Myocardial infarction ([place]) 2016   Pneumonia    as a child   PONV (postoperative nausea and vomiting)    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;  Service: Cardiovascular;  Laterality: N/A;   CARDIOVASCULAR STRESS TEST  03-21-2012  DR Johnsie Cancel   NORMAL NUCLEAR STUDY/  NO ISCHEMIA/  EF 63%   COLONOSCOPY  10/2020   COLONOSCOPY WITH PROPOFOL  05/29/2017   CYSTOSCOPY W/ URETERAL STENT PLACEMENT Left 03/26/2013   Procedure: CYSTOSCOPY WITH RETROGRADE PYELOGRAM ;  Surgeon: Molli Hazard, MD;  Location: [facility];  Service: Urology;  Laterality: Left;   CYSTOSCOPY WITH RETROGRADE PYELOGRAM, URETEROSCOPY AND STENT PLACEMENT Bilateral 03/19/2013   Procedure: CYSTOSCOPY WITH RETROGRADE PYELOGRAM,  BILATERAL URETEROSCOPY AND STENT PLACEMENT LEFT URETER,BILATERAL STONE EXTRACTION , HOLMIUM LASER LEFT URETER;  Surgeon: Molli Hazard, MD;  Location: [facility];  Service: Urology;  Laterality: Bilateral;   CYSTOSCOPY WITH STENT PLACEMENT Left 03/26/2013   Procedure: CYSTOSCOPY WITH STENT PLACEMENT;  Surgeon: Molli Hazard, MD;  Location: [facility];  Service: Urology;  Laterality: Left;   CYSTOSCOPY/URETEROSCOPY/HOLMIUM LASER/STENT PLACEMENT Left 07/07/2020   Procedure: CYSTOSCOPY LEFT URETEROSCOPY/HOLMIUM LASER/STENT PLACEMENT;  Surgeon: Lucas Mallow, MD;  Location: [facility];  Service: Urology;  Laterality: Left;   DIAGNOSTIC LAPAROSCOPY  04/12/2009   ESOPHAGOGASTRODUODENOSCOPY (EGD) WITH PROPOFOL N/A 02/10/2016   Procedure: ESOPHAGOGASTRODUODENOSCOPY (EGD) WITH PROPOFOL;  Surgeon: Ronald Lobo, MD;  Location: [facility];  Service: Endoscopy;  Laterality: N/A;   KIDNEY SURGERY  1966   BILATERAL  URETER'S DILATATION   LUMBAR LAMINECTOMY/DECOMPRESSION MICRODISCECTOMY  05/18/2011   Procedure: LUMBAR LAMINECTOMY/DECOMPRESSION MICRODISCECTOMY;  Surgeon: Johnn Hai;  Location: [facility];  Service: Orthopedics;  Laterality: Right;  Decompression Lumbar 4-Lumbar 5  Right    (xray)    MELANOMA EXCISION WITH SENTINEL LYMPH NODE BIOPSY  2001   moh's procedure/  RIGHT FOREHEAD AND UPPER EYEBROW   RIGHT LATERAL PAROTIDECTOMY W/ NERVE DISSECTION / RIGHT MODIFIED RADICAL NECK DISSECTION SPARING SCM ELEVENTH NERVE AND INTERNAL JUGULAR VEIN  09-12-2006  DR DWIGHT BATES   DR DWIGHT BATES; "inside gland; lots of lymph nodes"    MEDICATIONS:  acetaminophen (TYLENOL) 500 MG tablet   atorvastatin (LIPITOR) 20 MG tablet   calcium carbonate (TUMS - DOSED IN MG ELEMENTAL CALCIUM) 500 MG chewable tablet   carvedilol (COREG) 3.125 MG tablet   ibuprofen (ADVIL) 200 MG tablet   lidocaine (LIDODERM) 5 %   lisinopril (ZESTRIL) 2.5 MG tablet   nitroGLYCERIN (NITROSTAT) 0.4 MG SL tablet   oxyCODONE-acetaminophen (PERCOCET/ROXICET) 5-325 MG tablet   pantoprazole (PROTONIX) 40 MG tablet   promethazine (PHENERGAN) 25 MG tablet   silver sulfADIAZINE (SILVADENE) 1 % cream   No current facility-administered medications for this encounter.    If no changes, I anticipate pt can proceed with surgery as scheduled.   Willeen Cass, PhD, FNP-BC [facility]/Anesthesiology Phone: [phone] 05/04/2021 1:54 PM

## 2021-05-04 NOTE — Anesthesia Preprocedure Evaluation (Addendum)
Anesthesia Evaluation  Patient identified by MRN, date of birth, ID band Patient awake    Reviewed: Allergy & Precautions, H&P , NPO status , Patient's Chart, lab work & pertinent test results, reviewed documented beta blocker date and time   History of Anesthesia Complications (+) PONV and history of anesthetic complications  Airway Mallampati: II  TM Distance: >3 FB Neck ROM: full    Dental no notable dental hx. (+) Teeth Intact, Dental Advisory Given, Caps   Pulmonary neg pulmonary ROS,    Pulmonary exam normal breath sounds clear to auscultation       Cardiovascular Exercise Tolerance: Good hypertension, Pt. on medications + Past MI and +CHF   Rhythm:regular Rate:Normal  Cardiac MRI 02/2015: 1) Normal LV size and function 2) Quantitative EF 64% no RWMA;s 3) No scar tissue or hyperenhancement -Overall findings consistent with Takatsubo DCM  Echo 01/06/15: - Left ventricle: The cavity size was normal. Systolic function was mildly reduced. The estimated ejection fraction was 45%. Diffuse hypokinesis. Doppler parameters are consistent with abnormal left ventricular relaxation (grade 1 diastolic dysfunction). There was no evidence of elevated ventricular filling pressure by Doppler parameters.  - Aortic valve: Trileaflet; normal thickness leaflets. There was trivial regurgitation.  - Aortic root: The aortic root was normal in size.  - Mitral valve: Structurally normal valve. There was no regurgitation.  - Left atrium: The atrium was normal in size.  - Right ventricle: Systolic function was normal.  - Right atrium: The atrium was normal in size.  - Tricuspid valve: There was trivial regurgitation.  - Pulmonic valve: There was no regurgitation.  - Pulmonary arteries: Systolic pressure was within the normal range.  - Inferior vena cava: The vessel was normal in size.  - Pericardium, extracardiac: There was no pericardial  effusion.   Cardiac cath 12/30/14: 1. Normal coronary arteries. 2. Moderately to severely reduced LV systolic function with an ejection fraction of 30-35% with severe hypokinesis of the mid distal anterior, apical and mid to distal inferior walls consistent with stress-induced cardiomyopathy. 3. Mildly elevated left ventricular end-diastolic pressure.    Neuro/Psych  Headaches, PSYCHIATRIC DISORDERS Anxiety Depression  Neuromuscular disease    GI/Hepatic Neg liver ROS, GERD  ,  Endo/Other  negative endocrine ROS  Renal/GU negative Renal ROS  negative genitourinary   Musculoskeletal  (+) Arthritis , Osteoarthritis,    Abdominal   Peds  Hematology negative hematology ROS (+)   Anesthesia Other Findings   Reproductive/Obstetrics negative OB ROS                           Anesthesia Physical Anesthesia Plan  ASA: 3  Anesthesia Plan: General   Post-op Pain Management:    Induction: Intravenous  PONV Risk Score and Plan: 3 and Ondansetron, Dexamethasone and Treatment may vary due to age or medical condition  Airway Management Planned: Oral ETT  Additional Equipment: None  Intra-op Plan:   Post-operative Plan: Extubation in OR  Informed Consent: I have reviewed the patients History and Physical, chart, labs and discussed the procedure including the risks, benefits and alternatives for the proposed anesthesia with the patient or authorized representative who has indicated his/her understanding and acceptance.     Dental Advisory Given  Plan Discussed with: CRNA and Anesthesiologist  Anesthesia Plan Comments: (See APP note by Durel Salts, FNP  Pt is 60 years old with hx Takatsubo cardiomyopathy (2016), CHF, HTN, palpitations, asthma Anesthesia history includes:sensitive to benzodiazepines, pt stated "  almost died" Surgery was originally scheduled for 03/31/21 but cancelled day of surgery as pt was positive for COVID.  S/p colitis 04/11/21 -  seen in ED in [place] (notes in care everywhere) for colitis, rectal bleeding. Tx for infectious colitis with Cipro and flagyl)       Anesthesia Quick Evaluation

## 2021-05-06 ENCOUNTER — Encounter (HOSPITAL_COMMUNITY): Payer: Self-pay | Admitting: Specialist

## 2021-05-06 ENCOUNTER — Ambulatory Visit (HOSPITAL_COMMUNITY)
Admission: RE | Admit: 2021-05-06 | Discharge: 2021-05-07 | Disposition: A | Discharge status: Home / Self Care | Payer: BC Managed Care – PPO | Attending: Specialist | Admitting: Specialist

## 2021-05-06 ENCOUNTER — Ambulatory Visit (HOSPITAL_COMMUNITY): Payer: BC Managed Care – PPO

## 2021-05-06 ENCOUNTER — Other Ambulatory Visit: Payer: Self-pay

## 2021-05-06 ENCOUNTER — Ambulatory Visit (HOSPITAL_COMMUNITY): Payer: BC Managed Care – PPO | Admitting: Emergency Medicine

## 2021-05-06 ENCOUNTER — Ambulatory Visit (HOSPITAL_COMMUNITY): Payer: BC Managed Care – PPO | Admitting: Anesthesiology

## 2021-05-06 ENCOUNTER — Encounter (HOSPITAL_COMMUNITY)
Admission: RE | Disposition: A | Discharge status: Home / Self Care | Payer: Self-pay | Source: Home / Self Care | Attending: Specialist

## 2021-05-06 DIAGNOSIS — K219 Gastro-esophageal reflux disease without esophagitis: Secondary | ICD-10-CM | POA: Diagnosis not present

## 2021-05-06 DIAGNOSIS — M5117 Intervertebral disc disorders with radiculopathy, lumbosacral region: Secondary | ICD-10-CM | POA: Diagnosis not present

## 2021-05-06 DIAGNOSIS — M2578 Osteophyte, vertebrae: Secondary | ICD-10-CM | POA: Insufficient documentation

## 2021-05-06 DIAGNOSIS — M4807 Spinal stenosis, lumbosacral region: Secondary | ICD-10-CM | POA: Insufficient documentation

## 2021-05-06 DIAGNOSIS — M48061 Spinal stenosis, lumbar region without neurogenic claudication: Secondary | ICD-10-CM | POA: Diagnosis present

## 2021-05-06 DIAGNOSIS — I110 Hypertensive heart disease with heart failure: Secondary | ICD-10-CM | POA: Insufficient documentation

## 2021-05-06 DIAGNOSIS — M5127 Other intervertebral disc displacement, lumbosacral region: Secondary | ICD-10-CM | POA: Diagnosis not present

## 2021-05-06 DIAGNOSIS — Z9889 Other specified postprocedural states: Secondary | ICD-10-CM | POA: Diagnosis not present

## 2021-05-06 DIAGNOSIS — Z419 Encounter for procedure for purposes other than remedying health state, unspecified: Secondary | ICD-10-CM

## 2021-05-06 DIAGNOSIS — I5022 Chronic systolic (congestive) heart failure: Secondary | ICD-10-CM | POA: Insufficient documentation

## 2021-05-06 DIAGNOSIS — I252 Old myocardial infarction: Secondary | ICD-10-CM | POA: Insufficient documentation

## 2021-05-06 DIAGNOSIS — I700 Atherosclerosis of aorta: Secondary | ICD-10-CM | POA: Diagnosis not present

## 2021-05-06 DIAGNOSIS — G9519 Other vascular myelopathies: Secondary | ICD-10-CM | POA: Diagnosis not present

## 2021-05-06 DIAGNOSIS — G9589 Other specified diseases of spinal cord: Secondary | ICD-10-CM | POA: Diagnosis not present

## 2021-05-06 HISTORY — PX: LUMBAR LAMINECTOMY/DECOMPRESSION MICRODISCECTOMY: SHX5026

## 2021-05-06 SURGERY — LUMBAR LAMINECTOMY/DECOMPRESSION MICRODISCECTOMY 1 LEVEL
Anesthesia: General

## 2021-05-06 MED ORDER — HYDROMORPHONE HCL 1 MG/ML IJ SOLN: 0.5000 mg | INTRAMUSCULAR | Status: DC | PRN

## 2021-05-06 MED ORDER — VANCOMYCIN HCL 1000 MG/200ML IV SOLN
1000.0000 mg | Freq: Once | INTRAVENOUS | Status: AC
  Administered 2021-05-06: 1000 mg via INTRAVENOUS
  Filled 2021-05-06: qty 200

## 2021-05-06 MED ORDER — OXYCODONE HCL 5 MG PO TABS
5.0000 mg | ORAL_TABLET | ORAL | Status: DC | PRN
  Administered 2021-05-06: 5 mg via ORAL
  Filled 2021-05-06: qty 1

## 2021-05-06 MED ORDER — METHOCARBAMOL 500 MG PO TABS: 500.0000 mg | ORAL_TABLET | Freq: Three times a day (TID) | ORAL | 1 refills | Status: DC | PRN

## 2021-05-06 MED ORDER — THROMBIN 20000 UNITS EX SOLR: CUTANEOUS | Status: DC | PRN

## 2021-05-06 MED ORDER — PROPOFOL 500 MG/50ML IV EMUL
INTRAVENOUS | Status: DC | PRN
  Administered 2021-05-06: 25 ug/kg/min via INTRAVENOUS

## 2021-05-06 MED ORDER — MENTHOL 3 MG MT LOZG
1.0000 | LOZENGE | OROMUCOSAL | Status: DC | PRN
  Administered 2021-05-06: 3 mg via ORAL
  Filled 2021-05-06: qty 9

## 2021-05-06 MED ORDER — ACETAMINOPHEN 325 MG PO TABS: 325.0000 mg | ORAL_TABLET | ORAL | Status: DC | PRN

## 2021-05-06 MED ORDER — EPHEDRINE SULFATE-NACL 50-0.9 MG/10ML-% IV SOSY
PREFILLED_SYRINGE | INTRAVENOUS | Status: DC | PRN
Start: 2021-05-06 — End: 2021-05-06
  Administered 2021-05-06 (×2): 5 mg via INTRAVENOUS

## 2021-05-06 MED ORDER — SUGAMMADEX SODIUM 200 MG/2ML IV SOLN
INTRAVENOUS | Status: DC | PRN
  Administered 2021-05-06: 200 mg via INTRAVENOUS

## 2021-05-06 MED ORDER — ALUM & MAG HYDROXIDE-SIMETH 200-200-20 MG/5ML PO SUSP: 30.0000 mL | Freq: Four times a day (QID) | ORAL | Status: DC | PRN

## 2021-05-06 MED ORDER — OXYCODONE HCL 5 MG PO TABS
5.0000 mg | ORAL_TABLET | Freq: Once | ORAL | Status: AC | PRN
  Administered 2021-05-06: 5 mg via ORAL

## 2021-05-06 MED ORDER — POLYETHYLENE GLYCOL 3350 17 G PO PACK: 17.0000 g | PACK | Freq: Every day | ORAL | 0 refills | Status: DC

## 2021-05-06 MED ORDER — KCL IN DEXTROSE-NACL 20-5-0.45 MEQ/L-%-% IV SOLN: INTRAVENOUS | Status: DC

## 2021-05-06 MED ORDER — PHENYLEPHRINE 40 MCG/ML (10ML) SYRINGE FOR IV PUSH (FOR BLOOD PRESSURE SUPPORT)
PREFILLED_SYRINGE | INTRAVENOUS | Status: DC | PRN
Start: 2021-05-06 — End: 2021-05-06
  Administered 2021-05-06 (×3): 40 ug via INTRAVENOUS

## 2021-05-06 MED ORDER — VANCOMYCIN HCL IN DEXTROSE 1-5 GM/200ML-% IV SOLN
1000.0000 mg | INTRAVENOUS | Status: AC
  Administered 2021-05-06: 1000 mg via INTRAVENOUS
  Filled 2021-05-06: qty 200

## 2021-05-06 MED ORDER — FENTANYL CITRATE (PF) 250 MCG/5ML IJ SOLN
INTRAMUSCULAR | Status: AC
  Filled 2021-05-06: qty 5

## 2021-05-06 MED ORDER — PROPOFOL 10 MG/ML IV BOLUS
INTRAVENOUS | Status: DC | PRN
  Administered 2021-05-06: 140 mg via INTRAVENOUS

## 2021-05-06 MED ORDER — NITROGLYCERIN 0.4 MG SL SUBL: 0.4000 mg | SUBLINGUAL_TABLET | SUBLINGUAL | Status: DC | PRN

## 2021-05-06 MED ORDER — PHENOL 1.4 % MT LIQD: 1.0000 | OROMUCOSAL | Status: DC | PRN

## 2021-05-06 MED ORDER — DEXAMETHASONE SODIUM PHOSPHATE 10 MG/ML IJ SOLN
INTRAMUSCULAR | Status: DC | PRN
  Administered 2021-05-06: 10 mg via INTRAVENOUS

## 2021-05-06 MED ORDER — THROMBIN 20000 UNITS EX SOLR
CUTANEOUS | Status: AC
  Filled 2021-05-06: qty 20000

## 2021-05-06 MED ORDER — PROMETHAZINE HCL 25 MG PO TABS: 25.0000 mg | ORAL_TABLET | Freq: Three times a day (TID) | ORAL | Status: DC | PRN

## 2021-05-06 MED ORDER — CHLORHEXIDINE GLUCONATE 0.12 % MT SOLN
OROMUCOSAL | Status: AC
  Administered 2021-05-06: 15 mL
  Filled 2021-05-06: qty 15

## 2021-05-06 MED ORDER — ROCURONIUM BROMIDE 10 MG/ML (PF) SYRINGE
PREFILLED_SYRINGE | INTRAVENOUS | Status: DC | PRN
  Administered 2021-05-06: 100 mg via INTRAVENOUS

## 2021-05-06 MED ORDER — BUPIVACAINE-EPINEPHRINE 0.5% -1:200000 IJ SOLN
INTRAMUSCULAR | Status: AC
  Filled 2021-05-06: qty 1

## 2021-05-06 MED ORDER — ACETAMINOPHEN 10 MG/ML IV SOLN
1000.0000 mg | INTRAVENOUS | Status: AC
  Administered 2021-05-06: 1000 mg via INTRAVENOUS
  Filled 2021-05-06: qty 100

## 2021-05-06 MED ORDER — ONDANSETRON HCL 4 MG/2ML IJ SOLN: 4.0000 mg | Freq: Once | INTRAMUSCULAR | Status: DC | PRN

## 2021-05-06 MED ORDER — PANTOPRAZOLE SODIUM 40 MG PO TBEC: 40.0000 mg | DELAYED_RELEASE_TABLET | Freq: Every day | ORAL | Status: DC | PRN

## 2021-05-06 MED ORDER — METHOCARBAMOL 1000 MG/10ML IJ SOLN
500.0000 mg | Freq: Four times a day (QID) | INTRAVENOUS | Status: DC | PRN
  Filled 2021-05-06: qty 5

## 2021-05-06 MED ORDER — OXYCODONE HCL 5 MG/5ML PO SOLN: 5.0000 mg | Freq: Once | ORAL | Status: AC | PRN

## 2021-05-06 MED ORDER — OXYCODONE HCL 5 MG PO TABS
10.0000 mg | ORAL_TABLET | ORAL | Status: DC | PRN
  Administered 2021-05-06 – 2021-05-07 (×2): 10 mg via ORAL
  Filled 2021-05-06 (×2): qty 2

## 2021-05-06 MED ORDER — BISACODYL 5 MG PO TBEC: 5.0000 mg | DELAYED_RELEASE_TABLET | Freq: Every day | ORAL | Status: DC | PRN

## 2021-05-06 MED ORDER — BUPIVACAINE-EPINEPHRINE 0.5% -1:200000 IJ SOLN
INTRAMUSCULAR | Status: DC | PRN
  Administered 2021-05-06: 4 mL

## 2021-05-06 MED ORDER — PHENYLEPHRINE HCL-NACL 20-0.9 MG/250ML-% IV SOLN
INTRAVENOUS | Status: DC | PRN
  Administered 2021-05-06: 25 ug/min via INTRAVENOUS

## 2021-05-06 MED ORDER — DOCUSATE SODIUM 100 MG PO CAPS: 100.0000 mg | ORAL_CAPSULE | Freq: Two times a day (BID) | ORAL | 1 refills | Status: DC | PRN

## 2021-05-06 MED ORDER — LIDOCAINE 2% (20 MG/ML) 5 ML SYRINGE
INTRAMUSCULAR | Status: DC | PRN
  Administered 2021-05-06: 80 mg via INTRAVENOUS

## 2021-05-06 MED ORDER — OXYCODONE HCL 5 MG PO TABS: 5.0000 mg | ORAL_TABLET | ORAL | 0 refills | Status: DC | PRN

## 2021-05-06 MED ORDER — LISINOPRIL 2.5 MG PO TABS
2.5000 mg | ORAL_TABLET | Freq: Every morning | ORAL | Status: DC
  Filled 2021-05-06: qty 1

## 2021-05-06 MED ORDER — FENTANYL CITRATE (PF) 250 MCG/5ML IJ SOLN
INTRAMUSCULAR | Status: DC | PRN
  Administered 2021-05-06: 100 ug via INTRAVENOUS
  Administered 2021-05-06: 50 ug via INTRAVENOUS

## 2021-05-06 MED ORDER — ONDANSETRON HCL 4 MG/2ML IJ SOLN
INTRAMUSCULAR | Status: DC | PRN
  Administered 2021-05-06: 4 mg via INTRAVENOUS

## 2021-05-06 MED ORDER — METHOCARBAMOL 500 MG PO TABS
500.0000 mg | ORAL_TABLET | Freq: Four times a day (QID) | ORAL | Status: DC | PRN
  Administered 2021-05-06 – 2021-05-07 (×2): 500 mg via ORAL
  Filled 2021-05-06 (×2): qty 1

## 2021-05-06 MED ORDER — ONDANSETRON HCL 4 MG/2ML IJ SOLN: 4.0000 mg | Freq: Four times a day (QID) | INTRAMUSCULAR | Status: DC | PRN

## 2021-05-06 MED ORDER — ACETAMINOPHEN 325 MG PO TABS: 650.0000 mg | ORAL_TABLET | ORAL | Status: DC | PRN

## 2021-05-06 MED ORDER — POLYETHYLENE GLYCOL 3350 17 G PO PACK: 17.0000 g | PACK | Freq: Every day | ORAL | Status: DC | PRN

## 2021-05-06 MED ORDER — 0.9 % SODIUM CHLORIDE (POUR BTL) OPTIME
TOPICAL | Status: DC | PRN
  Administered 2021-05-06: 1000 mL

## 2021-05-06 MED ORDER — CALCIUM CARBONATE ANTACID 500 MG PO CHEW: 1000.0000 mg | CHEWABLE_TABLET | Freq: Every day | ORAL | Status: DC | PRN

## 2021-05-06 MED ORDER — CARVEDILOL 3.125 MG PO TABS
3.1250 mg | ORAL_TABLET | Freq: Two times a day (BID) | ORAL | Status: DC
  Administered 2021-05-07: 3.125 mg via ORAL
  Filled 2021-05-06: qty 1

## 2021-05-06 MED ORDER — PROPOFOL 10 MG/ML IV BOLUS
INTRAVENOUS | Status: AC
  Filled 2021-05-06: qty 20

## 2021-05-06 MED ORDER — ONDANSETRON HCL 4 MG PO TABS: 4.0000 mg | ORAL_TABLET | Freq: Four times a day (QID) | ORAL | Status: DC | PRN

## 2021-05-06 MED ORDER — ACETAMINOPHEN 160 MG/5ML PO SOLN: 325.0000 mg | ORAL | Status: DC | PRN

## 2021-05-06 MED ORDER — LACTATED RINGERS IV SOLN: INTRAVENOUS | Status: DC

## 2021-05-06 MED ORDER — GENTAMICIN SULFATE 40 MG/ML IJ SOLN
1.5000 mg/kg | INTRAVENOUS | Status: AC
  Administered 2021-05-06: 120 mg via INTRAVENOUS
  Filled 2021-05-06: qty 3

## 2021-05-06 MED ORDER — DOCUSATE SODIUM 100 MG PO CAPS
100.0000 mg | ORAL_CAPSULE | Freq: Two times a day (BID) | ORAL | Status: DC
  Administered 2021-05-06 – 2021-05-07 (×2): 100 mg via ORAL
  Filled 2021-05-06 (×2): qty 1

## 2021-05-06 MED ORDER — FENTANYL CITRATE (PF) 100 MCG/2ML IJ SOLN: 25.0000 ug | INTRAMUSCULAR | Status: DC | PRN

## 2021-05-06 MED ORDER — OXYCODONE HCL 5 MG PO TABS
ORAL_TABLET | ORAL | Status: AC
  Filled 2021-05-06: qty 1

## 2021-05-06 MED ORDER — ACETAMINOPHEN 650 MG RE SUPP: 650.0000 mg | RECTAL | Status: DC | PRN

## 2021-05-06 MED ORDER — MEPERIDINE HCL 25 MG/ML IJ SOLN: 6.2500 mg | INTRAMUSCULAR | Status: DC | PRN

## 2021-05-06 MED ORDER — MIDAZOLAM HCL 2 MG/2ML IJ SOLN
INTRAMUSCULAR | Status: AC
  Filled 2021-05-06: qty 2

## 2021-05-06 MED ORDER — RISAQUAD PO CAPS
1.0000 | ORAL_CAPSULE | Freq: Every day | ORAL | Status: DC
  Administered 2021-05-06 – 2021-05-07 (×2): 1 via ORAL
  Filled 2021-05-06 (×2): qty 1

## 2021-05-06 SURGICAL SUPPLY — 57 items
BAG COUNTER SPONGE SURGICOUNT (BAG) ×2 IMPLANT
BAG DECANTER FOR FLEXI CONT (MISCELLANEOUS) IMPLANT
BAND RUBBER #18 3X1/16 STRL (MISCELLANEOUS) ×4 IMPLANT
BUR EGG ELITE 5.0 (BURR) IMPLANT
BUR RND DIAMOND ELITE 4.0 (BURR) ×2 IMPLANT
BUR STRYKR EGG 5.0 (BURR) IMPLANT
CARTRIDGE OIL MAESTRO DRILL (MISCELLANEOUS) IMPLANT
CLEANER TIP ELECTROSURG 2X2 (MISCELLANEOUS) ×2 IMPLANT
CNTNR URN SCR LID CUP LEK RST (MISCELLANEOUS) ×1 IMPLANT
CONT SPEC 4OZ STRL OR WHT (MISCELLANEOUS) ×2
DIFFUSER DRILL AIR PNEUMATIC (MISCELLANEOUS) IMPLANT
DRAPE LAPAROTOMY 100X72X124 (DRAPES) ×2 IMPLANT
DRAPE MICROSCOPE LEICA (MISCELLANEOUS) ×2 IMPLANT
DRAPE SHEET LG 3/4 BI-LAMINATE (DRAPES) ×2 IMPLANT
DRAPE SURG 17X11 SM STRL (DRAPES) ×2 IMPLANT
DRAPE UTILITY XL STRL (DRAPES) ×2 IMPLANT
DRSG AQUACEL AG ADV 3.5X 4 (GAUZE/BANDAGES/DRESSINGS) ×2 IMPLANT
DRSG AQUACEL AG ADV 3.5X 6 (GAUZE/BANDAGES/DRESSINGS) IMPLANT
DRSG TELFA 3X8 NADH (GAUZE/BANDAGES/DRESSINGS) IMPLANT
DURAPREP 26ML APPLICATOR (WOUND CARE) ×2 IMPLANT
DURASEAL SPINE SEALANT 3ML (MISCELLANEOUS) IMPLANT
ELECT BLADE 4.0 EZ CLEAN MEGAD (MISCELLANEOUS)
ELECT REM PT RETURN 9FT ADLT (ELECTROSURGICAL) ×2
ELECTRODE BLDE 4.0 EZ CLN MEGD (MISCELLANEOUS) IMPLANT
ELECTRODE REM PT RTRN 9FT ADLT (ELECTROSURGICAL) ×1 IMPLANT
GLOVE SURG POLYISO LF SZ7.5 (GLOVE) ×2 IMPLANT
GLOVE SURG POLYISO LF SZ8 (GLOVE) ×4 IMPLANT
GLOVE SURG UNDER POLY LF SZ7 (GLOVE) ×2 IMPLANT
GOWN STRL REUS W/ TWL LRG LVL3 (GOWN DISPOSABLE) ×1 IMPLANT
GOWN STRL REUS W/ TWL XL LVL3 (GOWN DISPOSABLE) ×1 IMPLANT
GOWN STRL REUS W/TWL LRG LVL3 (GOWN DISPOSABLE) ×2
GOWN STRL REUS W/TWL XL LVL3 (GOWN DISPOSABLE) ×2
IV CATH 14GX2 1/4 (CATHETERS) ×2 IMPLANT
KIT BASIN OR (CUSTOM PROCEDURE TRAY) ×2 IMPLANT
NEEDLE 22X1 1/2 (OR ONLY) (NEEDLE) ×2 IMPLANT
NEEDLE SPNL 18GX3.5 QUINCKE PK (NEEDLE) ×4 IMPLANT
OIL CARTRIDGE MAESTRO DRILL (MISCELLANEOUS)
PACK LAMINECTOMY NEURO (CUSTOM PROCEDURE TRAY) ×2 IMPLANT
PATTIES SURGICAL .75X.75 (GAUZE/BANDAGES/DRESSINGS) ×2 IMPLANT
SPONGE SURGIFOAM ABS GEL 100 (HEMOSTASIS) ×2 IMPLANT
SPONGE T-LAP 4X18 ~~LOC~~+RFID (SPONGE) IMPLANT
STAPLER VISISTAT (STAPLE) ×2 IMPLANT
STRIP CLOSURE SKIN 1/2X4 (GAUZE/BANDAGES/DRESSINGS) ×2 IMPLANT
SUT NURALON 4 0 TR CR/8 (SUTURE) IMPLANT
SUT PROLENE 3 0 PS 2 (SUTURE) ×2 IMPLANT
SUT VIC AB 1 CT1 27 (SUTURE) ×2
SUT VIC AB 1 CT1 27XBRD ANTBC (SUTURE) ×1 IMPLANT
SUT VIC AB 1-0 CT2 27 (SUTURE) IMPLANT
SUT VIC AB 2-0 CT1 27 (SUTURE)
SUT VIC AB 2-0 CT1 TAPERPNT 27 (SUTURE) IMPLANT
SUT VIC AB 2-0 CT2 27 (SUTURE) ×2 IMPLANT
SYR 3ML LL SCALE MARK (SYRINGE) ×2 IMPLANT
TOWEL GREEN STERILE (TOWEL DISPOSABLE) ×2 IMPLANT
TOWEL GREEN STERILE FF (TOWEL DISPOSABLE) ×2 IMPLANT
TRAY FOLEY MTR SLVR 16FR STAT (SET/KITS/TRAYS/PACK) ×2 IMPLANT
WIPE CHG CHLORHEXIDINE 2% (PERSONAL CARE ITEMS) ×2 IMPLANT
YANKAUER SUCT BULB TIP NO VENT (SUCTIONS) ×2 IMPLANT

## 2021-05-06 NOTE — Brief Op Note (Signed)
05/06/2021  4:32 PM  PATIENT:  [name]  61 y.o. female  PRE-OPERATIVE DIAGNOSIS:  Spinal Stenosis L5-S1  POST-OPERATIVE DIAGNOSIS:  spinal stenosis Lumbar five sacral one  PROCEDURE:  Procedure(s) with comments: Microlumbar decompression L5-S1 (N/A) - 3 C-bed 90 mins  SURGEON:  Surgeon(s) and Role:    Susa Day, MD - Primary  PHYSICIAN ASSISTANT:   ASSISTANTS: [place]   ANESTHESIA:   general  EBL:  50 mL   BLOOD ADMINISTERED:none  DRAINS: none   LOCAL MEDICATIONS USED:  MARCAINE     SPECIMEN:  No Specimen  DISPOSITION OF SPECIMEN:  N/A  COUNTS:  YES  TOURNIQUET:  * No tourniquets in log *  DICTATION: .Other Dictation: Dictation Number [id number]  PLAN OF CARE: Admit for overnight observation  PATIENT DISPOSITION:  PACU - hemodynamically stable.   Delay start of Pharmacological VTE agent (>24hrs) due to surgical blood loss or risk of bleeding: yes

## 2021-05-06 NOTE — Interval H&P Note (Signed)
History and Physical Interval Note:  05/06/2021 12:59 PM  [name]  has presented today for surgery, with the diagnosis of Spinal Stenosis L5-S1.  The various methods of treatment have been discussed with the patient and family. After consideration of risks, benefits and other options for treatment, the patient has consented to  Procedure(s) with comments: Microlumbar decompression L5-S1 (N/A) - 3 C-bed 90 mins as a surgical intervention.  The patient's history has been reviewed, patient examined, no change in status, stable for surgery.  I have reviewed the patient's chart and labs.  Questions were answered to the patient's satisfaction.     Johnn Hai

## 2021-05-06 NOTE — Anesthesia Postprocedure Evaluation (Signed)
Anesthesia Post Note  Patient: [name]  Procedure(s) Performed: Microlumbar decompression L5-S1     Patient location during evaluation: PACU Anesthesia Type: General Level of consciousness: awake and alert Pain management: pain level controlled Vital Signs Assessment: post-procedure vital signs reviewed and stable Respiratory status: spontaneous breathing, nonlabored ventilation, respiratory function stable and patient connected to nasal cannula oxygen Cardiovascular status: blood pressure returned to baseline and stable Postop Assessment: no apparent nausea or vomiting Anesthetic complications: no   No notable events documented.  Last Vitals:  Vitals:   05/06/21 1119 05/06/21 1636  BP: (!) 166/71 (!) 151/88  Pulse: 70 77  Resp: 17 16  Temp: 36.6 C 37.1 C  SpO2: 98% 96%    Last Pain:  Vitals:   05/06/21 1636  TempSrc:   PainSc: 0-No pain    LLE Motor Response: Purposeful movement;Responds to commands (05/06/21 1636) LLE Sensation: Full sensation (05/06/21 1636) RLE Motor Response: Purposeful movement;Responds to commands (05/06/21 1636) RLE Sensation: Full sensation (05/06/21 1636)      [name]

## 2021-05-06 NOTE — Anesthesia Procedure Notes (Signed)
Procedure Name: Intubation Date/Time: 05/06/2021 2:21 PM Performed by: Dorthea Cove, CRNA Pre-anesthesia Checklist: Patient identified, Emergency Drugs available, Suction available and Patient being monitored Patient Re-evaluated:Patient Re-evaluated prior to induction Oxygen Delivery Method: Circle system utilized Preoxygenation: Pre-oxygenation with 100% oxygen Induction Type: IV induction Ventilation: Mask ventilation without difficulty Laryngoscope Size: Mac and 3 Grade View: Grade I Tube type: Oral Tube size: 7.0 mm Number of attempts: 1 Airway Equipment and Method: Stylet and Oral airway Placement Confirmation: ETT inserted through vocal cords under direct vision, positive ETCO2 and breath sounds checked- equal and bilateral Secured at: 21 cm Tube secured with: Tape Dental Injury: Teeth and Oropharynx as per pre-operative assessment

## 2021-05-06 NOTE — Discharge Instructions (Signed)

## 2021-05-06 NOTE — Transfer of Care (Signed)
Immediate Anesthesia Transfer of Care Note  Patient: [name]  Procedure(s) Performed: Microlumbar decompression L5-S1  Patient Location: PACU  Anesthesia Type:General  Level of Consciousness: awake, drowsy and patient cooperative  Airway & Oxygen Therapy: Patient Spontanous Breathing  Post-op Assessment: Report given to RN and Post -op Vital signs reviewed and stable  Post vital signs: Reviewed and stable  Last Vitals:  Vitals Value Taken Time  BP 151/88 05/06/21 1636  Temp    Pulse 76 05/06/21 1638  Resp 17 05/06/21 1638  SpO2 95 % 05/06/21 1638  Vitals shown include unvalidated device data.  Last Pain:  Vitals:   05/06/21 1125  TempSrc:   PainSc: 0-No pain      Patients Stated Pain Goal: 2 (93/24/19 9144)  Complications: No notable events documented.

## 2021-05-07 ENCOUNTER — Encounter (HOSPITAL_COMMUNITY): Payer: Self-pay | Admitting: Specialist

## 2021-05-07 DIAGNOSIS — I5022 Chronic systolic (congestive) heart failure: Secondary | ICD-10-CM | POA: Diagnosis not present

## 2021-05-07 DIAGNOSIS — M2578 Osteophyte, vertebrae: Secondary | ICD-10-CM | POA: Diagnosis not present

## 2021-05-07 DIAGNOSIS — I252 Old myocardial infarction: Secondary | ICD-10-CM | POA: Diagnosis not present

## 2021-05-07 DIAGNOSIS — M5117 Intervertebral disc disorders with radiculopathy, lumbosacral region: Secondary | ICD-10-CM | POA: Diagnosis not present

## 2021-05-07 DIAGNOSIS — K219 Gastro-esophageal reflux disease without esophagitis: Secondary | ICD-10-CM | POA: Diagnosis not present

## 2021-05-07 DIAGNOSIS — I110 Hypertensive heart disease with heart failure: Secondary | ICD-10-CM | POA: Diagnosis not present

## 2021-05-07 DIAGNOSIS — M4807 Spinal stenosis, lumbosacral region: Secondary | ICD-10-CM | POA: Diagnosis not present

## 2021-05-07 LAB — BASIC METABOLIC PANEL
Anion gap: 7 (ref 5–15)
BUN: 8 mg/dL (ref 8–23)
CO2: 26 mmol/L (ref 22–32)
Calcium: 9 mg/dL (ref 8.9–10.3)
Chloride: 104 mmol/L (ref 98–111)
Creatinine, Ser: 0.77 mg/dL (ref 0.44–1.00)
GFR, Estimated: >60 mL/min (ref >60)
Glucose, Bld: 128 mg/dL — ABNORMAL HIGH (ref 70–99)
Potassium: 4.3 mmol/L (ref 3.5–5.1)
Sodium: 137 mmol/L (ref 135–145)

## 2021-05-07 MED ORDER — DIPHENHYDRAMINE HCL 25 MG PO CAPS
25.0000 mg | ORAL_CAPSULE | Freq: Four times a day (QID) | ORAL | Status: DC | PRN
  Administered 2021-05-07: 25 mg via ORAL
  Filled 2021-05-07: qty 1

## 2021-05-07 NOTE — Discharge Summary (Signed)
In most cases prophylactic antibiotics for Dental procdeures after total joint surgery are not necessary.  Exceptions are as follows:  1. History of prior total joint infection  2. Severely immunocompromised (Organ Transplant, cancer chemotherapy, Rheumatoid biologic meds such as [place])  3. Poorly controlled diabetes (A1C &gt; 8.0, blood glucose over 200)  If you have one of these conditions, contact your surgeon for an antibiotic prescription, prior to your dental procedure. Orthopedic Discharge Summary        Physician Discharge Summary  Patient ID: [name] MRN: [id number] DOB/AGE: September 11, 1960 59 y.o.  Admit date: 05/06/2021 Discharge date: 05/07/2021   Procedures:  Procedure(s) (LRB): Microlumbar decompression L5-S1 (N/A)  Attending Physician:  Dr. Esmond Plants  Admission Diagnoses:   spinal stenosis  Discharge Diagnoses:  spinal stenosis   Past Medical History:  Diagnosis Date   Anxiety    Arthritis    back (12/30/2014)   Asthma    as a child, no problems as an adult, has an albuterol inhaler   Cerebral cyst    per brain MRI 07-12-2019 unchanged 55mm cyst inferomedial right frontal lobe   Chronic headaches    Chronic systolic (congestive) heart failure ([place])    followed by dr Frances Nickels   Complication of anesthesia    pt is very sensitive to benzodiazepines, pt stated "almost died"   Compression fracture of lumbar vertebra ([place]) 05/27/2020   per pt L3 and L4   DCM (dilated cardiomyopathy) ([place])    w/ Takatsubo---  followed by dr Johnsie Cancel---  stress-induced --- cardiac cath 12-30-2014 ef 30-35%, recovered per cardiac MRI 03-15-2015 ef  64%   Depression    Disorder of mastoid    per brain MRI 07-12-2019 persistant large mastoid effusion   Dysuria    Environmental and seasonal allergies    Frequency of urination    GERD (gastroesophageal reflux disease)    History of asthma    childhood   History of esophageal spasm    History of kidney stones     History of melanoma excision 2001   right supraorbital (right forehead and upper eyelid )  s/p  Moh's procedure w/ sln bx,  no recurrence per pt   History of non-ST elevation myocardial infarction (NSTEMI) 12/29/2014   per cath normal coronaries , ef 30-35%---  stress-- induced Takotsubo syndrome    History of palpitations 01/05/2015   STRESS INDUCED   History of parotid cancer 2008   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-2006 s/p  right lateral parotidectomy w/ nerve dissection , modified radical neck dissection ;  completed  x35 fractions raditation 2008;  no recurrence per pt   Hyperlipidemia    Hypertension    Kidney cysts    Left ureteral calculus    Low back pain    Melanoma ([place]) 2001   face   Muscle spasm of back    Myocardial infarction ([place]) 2016   Pneumonia    as a child   PONV (postoperative nausea and vomiting)    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 Patient tolerated the procedure well and brought to the recovery room in good condition and subsequently to the floor. Patient had an uncomplicated hospital course and was stable for discharge.   Disposition: Discharge disposition: 01-Home or Self Care      with follow up in 2 weeks    Follow-up Information     Susa Day, MD Follow up in 2 week(s).   Specialty: Orthopedic Surgery Contact information: [address] [place] [address] [phone]                 Dental Antibiotics:  In most cases prophylactic antibiotics for Dental procdeures after total joint surgery  are not necessary.  Exceptions are as follows:  1. History of prior total joint infection  2. Severely immunocompromised (Organ Transplant, cancer chemotherapy, Rheumatoid biologic meds such as [place])  3. Poorly controlled diabetes (A1C &gt; 8.0, blood glucose over 200)  If you have one of these conditions, contact your surgeon for an antibiotic prescription, prior to your dental procedure.  Discharge Instructions     Call MD / Call 911   Complete by: As directed    If you experience chest pain or shortness of breath, CALL 911 and be transported to the hospital emergency room.  If you develope a fever above 101 F, pus (white drainage) or increased drainage or redness at the wound, or calf pain, call your surgeon's office.   Constipation Prevention   Complete by: As directed    Drink plenty of fluids.  Prune juice may be helpful.  You may use a stool softener, such as Colace (over the counter) 100 mg twice a day.  Use MiraLax (over the counter) for constipation as needed.   Diet - low sodium heart healthy   Complete by: As directed    Increase activity slowly as tolerated   Complete by: As directed    Post-operative opioid taper instructions:   Complete by: As directed    POST-OPERATIVE OPIOID TAPER INSTRUCTIONS: It is important to wean off of your opioid medication as soon as possible. If you do not need pain medication after your surgery it is ok to stop day one. Opioids include: Codeine, Hydrocodone(Norco, Vicodin), Oxycodone(Percocet, oxycontin) and hydromorphone amongst others.  Long term and even short term use of opiods can cause: Increased pain response Dependence Constipation Depression Respiratory depression And more.  Withdrawal symptoms can include Flu like symptoms Nausea, vomiting And more Techniques to manage these symptoms Hydrate well Eat regular healthy meals Stay active Use relaxation techniques(deep breathing, meditating, yoga) Do Not substitute  Alcohol to help with tapering If you have been on opioids for less than two weeks and do not have pain than it is ok to stop all together.  Plan to wean off of opioids This plan should start within one week post op of your joint replacement. Maintain the same interval or time between taking each dose and first decrease the dose.  Cut the total daily intake of opioids by one tablet each day Next start to increase the time between doses. The last dose that should be eliminated is the evening dose.          Allergies as of 05/07/2021       Reactions   Cephalexin Shortness Of Breath, Rash   Lorazepam Shortness Of Breath, Other (See Comments)   She may be very sensitive to benzo.    Stops breathing    Promethazine    Felt strange, "knocked me  out"        Medication List     STOP taking these medications    atorvastatin 20 MG tablet Commonly known as: LIPITOR   ibuprofen 200 MG tablet Commonly known as: ADVIL   oxyCODONE-acetaminophen 5-325 MG tablet Commonly known as: PERCOCET/ROXICET       TAKE these medications    acetaminophen 500 MG tablet Commonly known as: TYLENOL Take 1,000 mg by mouth every 6 (six) hours as needed for moderate pain.   calcium carbonate 500 MG chewable tablet Commonly known as: TUMS - dosed in mg elemental calcium Chew 1,000 mg by mouth daily as needed for indigestion or heartburn.   carvedilol 3.125 MG tablet Commonly known as: COREG TAKE 1 TABLET BY MOUTH TWICE DAILY WITH A MEAL   docusate sodium 100 MG capsule Commonly known as: Colace Take 1 capsule (100 mg total) by mouth 2 (two) times daily as needed for mild constipation.   lidocaine 5 % Commonly known as: LIDODERM Place 1 patch onto the skin daily as needed (pain).   lisinopril 2.5 MG tablet Commonly known as: ZESTRIL Take 1 tablet (2.5 mg total) by mouth every morning.   methocarbamol 500 MG tablet Commonly known as: Robaxin Take 1 tablet (500 mg total) by mouth every  8 (eight) hours as needed for muscle spasms.   nitroGLYCERIN 0.4 MG SL tablet Commonly known as: NITROSTAT Place 1 tablet (0.4 mg total) under the tongue every 5 (five) minutes as needed for chest pain. DISSOLVE ONE TABLET UNDER THE TONGUE EVERY 5 MINUTES AS NEEDED FOR CHEST PAIN.  DO NOT EXCEED A TOTAL OF 3 DOSES IN 15 MINUTES   oxyCODONE 5 MG immediate release tablet Commonly known as: Oxy IR/ROXICODONE Take 1 tablet (5 mg total) by mouth every 4 (four) hours as needed for severe pain.   pantoprazole 40 MG tablet Commonly known as: PROTONIX Take 40 mg by mouth daily as needed (GERD).   polyethylene glycol 17 g packet Commonly known as: MIRALAX / GLYCOLAX Take 17 g by mouth daily.   promethazine 25 MG tablet Commonly known as: PHENERGAN Take 1 tablet (25 mg total) by mouth every 8 (eight) hours as needed for nausea or vomiting.   silver sulfADIAZINE 1 % cream Commonly known as: Silvadene Apply 1 application topically daily. Apply to shoulder nightly 7-14 days          Signed: Ventura Bruns 05/07/2021, 7:43 AM  [facility] is now [employer] [address], [place], [place], [place] [address] Phone: [place]

## 2021-05-07 NOTE — Progress Notes (Signed)
   Subjective: 1 Day Post-Op Procedure(s) (LRB): Microlumbar decompression L5-S1 (N/A)  Pt doing well Feeling better this morning Denies any numbness/tingling distally Otherwise ready for d/c Patient reports pain as mild.  Objective:   VITALS:   Vitals:   05/06/21 2336 05/07/21 0408  BP: (!) 142/62 (!) 117/56  Pulse: 73 69  Resp: 18 18  Temp: 97.8 F (36.6 C) 97.9 F (36.6 C)  SpO2: 94% 96%    Lumbar incision healing well Nv intact distally No rashes or edema distally Good rom of bilateral lower extremities  LABS No results for input(s): HGB, HCT, WBC, PLT in the last 72 hours.  Recent Labs    05/07/21 0339  NA 137  K 4.3  BUN 8  CREATININE 0.77  GLUCOSE 128*     Assessment/Plan: 1 Day Post-Op Procedure(s) (LRB): Microlumbar decompression L5-S1 (N/A) Pt doing well D/c home today F/u in  2 weeks in the office     Brad Billyjack Trompeter PA-C, [facility] is now [employer] [facility] [address], [place], [place], [place] [address] Phone: [phone] [email or web address] Facebook  [employer]

## 2021-05-07 NOTE — Op Note (Signed)
[name], [name] MEDICAL RECORD NO: [id number] ACCOUNT NO: [id number] DATE OF BIRTH: 1961-02-26 FACILITY: [facility] LOCATION: [facility] PHYSICIAN: Johnn Hai, MD  Operative Report   DATE OF PROCEDURE: 05/06/2021  SURGEON:  Johnn Hai, MD  PREOPERATIVE DIAGNOSES:  Spinal stenosis; herniated nucleus pulposus, L5-S1, right.  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  PROCEDURE PERFORMED: 1.  Microlumbar decompression, L5-S1, right. 2.  Foraminotomies, L5-S1, right. 3.  Lysis of epidural venous plexus tethering the S1 nerve root.  ANESTHESIA:  General.  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  Risks and benefits discussed including bleeding, infection, damage to neurovascular structures, no change in symptoms, worsening symptoms, DVT, PE, anesthetic complications, etc.  TECHNIQUE:  The patient in supine position after induction of adequate general anesthesia, vancomycin and gentamicin for antimicrobial prophylaxis.  The patient was placed prone on the Wilson frame.  Foley to gravity.  All bony prominences well padded.   Lumbar region was prepped and draped in the usual sterile fashion.  Two 18-gauge spinal needle was utilized to localize L5-S1 interspace, confirmed with x-ray.  Incision was made from the spinous process of L5-S1.  Subcutaneous tissues were dissected.   Electrocautery was utilized to achieve hemostasis.  Dorsal lumbar fascia was divided in line with skin incision.  Paraspinous muscle elevated from the lamina of L5 and S1.  McCulloch retractor was placed.  Operating microscope was draped, brought in the   surgical field.  Confirmatory radiograph obtained.  Next, a curette was utilized to detach the ligamentum flavum from the cephalad edge of S1 and caudad edge of L5.  There was some scar tissue from the previous surgery.  Next, with the [facility]  protecting the neural elements, I performed a foraminotomy of S1 and a hemilaminotomy of S1.  The ligamentum flavum removed from the interspace.  The nerve root was found to be compressed into the lateral recess.  I gently mobilized it medially.  In  hypertrophic facet, I used a 2 mm Kerrison to decompress that to the medial border of the pedicle.  There was a small disk herniation noted.  This was retrieved with a micropituitary.  The remainder of that was a hardened disk with an osteophyte.  I  performed a foraminotomy of L5 as well.  There was tethering veins extensively along the lateral aspect of the S1 nerve root, tethering the root laterally.  We gently mobilized the nerve root medially, isolated the vascular plexus and one by one isolated  them with a nerve hook and then bipolar electrocautery to cauterize the vein and lyse it.  This freed up the S1 nerve roots.  We had 1 mm of excursion of the medial pedicle without tension.  I curetted the laminotomy, smoothed it out and bone waxed it.   Following this, the probe passed freely out the foramen of L5 and S1 past the pedicles.  No evidence of disk herniation within the axilla, foramen underneath the thecal sac cephalad or caudad.  Copiously irrigated with irrigation.  A confirmatory  radiograph obtained of the disk space.  Following this, thrombin-soaked Gelfoam was placed in the laminotomy defect and the Gelfoam removed.  No evidence of CSF leakage or active  bleeding.  Removed the McCulloch retractor and copiously irrigated the  paraspinous musculature.  I then closed the dorsal lumbar fascia with 1 Vicryl in interrupted figure-of-eight sutures, subcutaneous with 2-0 and skin with Prolene.  Sterile dressing  applied.  Placed supine on the hospital bed, extubated without  difficulty and transported to the recovery room in satisfactory condition.  The patient tolerated the procedure well.  No complications.  Assistant, Lacie Draft, PA was used throughout the case for patient positioning, gentle intermittent neural retraction, suction and closure.  EBL was 50 mL.   NIK D: 05/06/2021 4:38:10 pm T: 05/07/2021 3:01:00 am  JOB: [id number]/ [id number]

## 2021-05-07 NOTE — Plan of Care (Signed)

## 2021-05-07 NOTE — Evaluation (Signed)
Physical Therapy Evaluation and Discharge Patient Details Name: [name] MRN: [id number] DOB: 07-22-1960 Today's Date: 05/07/2021  History of Present Illness  This 60 yo female is s/p microlumbar decompression, L5-S1, right; Foraminotomies, L5-S1, right; and lysis of epidural venous plexus tethering the S1 nerve root 05/06/2021. Significant PMH: recent NSTEMI.  Clinical Impression  Patient evaluated by Physical Therapy with no further acute PT needs identified. Pt reports improvement in radicular symptoms. Overall, she is moving well, ambulating 400 feet with no assistive device and navigated 3 steps with a railing. Continues to display mild left lateral shift and instructed on right lateral shifts for midline/neutral posture. Education provided regarding activity/exercise recommendations, spinal precautions for comfort, car transfer technique. All education has been completed and the patient has no further questions. No follow-up Physical Therapy or equipment needs. PT is signing off. Thank you for this referral.      Recommendations for follow up therapy are one component of a multi-disciplinary discharge planning process, led by the attending physician.  Recommendations may be updated based on patient status, additional functional criteria and insurance authorization.  Follow Up Recommendations No PT follow up    Assistance Recommended at Discharge Intermittent Supervision/Assistance  Functional Status Assessment Patient has had a recent decline in their functional status and demonstrates the ability to make significant improvements in function in a reasonable and predictable amount of time.  Equipment Recommendations  None recommended by PT    Recommendations for Other Services       Precautions / Restrictions Precautions Precautions: Back Precaution Booklet Issued: Yes (comment) Restrictions Weight Bearing Restrictions: No      Mobility  Bed Mobility Overal bed  mobility: Modified Independent             General bed mobility comments: Sitting EOB upon arrival    Transfers Overall transfer level: Independent Equipment used: None                    Ambulation/Gait Ambulation/Gait assistance: Modified independent (Device/Increase time) Gait Distance (Feet): 400 Feet Assistive device: None Gait Pattern/deviations: Step-through pattern       General Gait Details: Pt with increased L foot inversion/pronation; able to correct some with cues. Overall steady pace  Stairs Stairs: Yes Stairs assistance: Modified independent (Device/Increase time) Stair Management: One rail Right Number of Stairs: 3 General stair comments: cues for step by step  Wheelchair Mobility    Modified Rankin (Stroke Patients Only)       Balance Overall balance assessment: Mild deficits observed, not formally tested                                           Pertinent Vitals/Pain Pain Assessment: Faces Faces Pain Scale: Hurts a little bit Pain Location: incisional Pain Descriptors / Indicators: Aching;Sore Pain Intervention(s): Monitored during session    Home Living Family/patient expects to be discharged to:: Private residence Living Arrangements: Spouse/significant other (caregiver for husband) Available Help at Discharge: Family;Available PRN/intermittently Type of Home: House Home Access: Stairs to enter Entrance Stairs-Rails: Psychiatric nurse of Steps: 3   Home Layout: One level Home Equipment: Conservation officer, nature (2 wheels);Rollator (4 wheels);Cane - single point;Grab bars - tub/shower      Prior Function Prior Level of Function : Independent/Modified Independent             Mobility Comments: Works as Occupational psychologist  Hand Dominance   Dominant Hand: Right    Extremity/Trunk Assessment   Upper Extremity Assessment Upper Extremity Assessment: Overall WFL for tasks assessed    Lower  Extremity Assessment Lower Extremity Assessment: RLE deficits/detail;LLE deficits/detail RLE Deficits / Details: strength 5/5 LLE Deficits / Details: strength 5/5    Cervical / Trunk Assessment Cervical / Trunk Assessment: Back Surgery  Communication   Communication: No difficulties  Cognition Arousal/Alertness: Awake/alert Behavior During Therapy: WFL for tasks assessed/performed Overall Cognitive Status: Within Functional Limits for tasks assessed                                          General Comments      Exercises Other Exercises Other Exercises: R lateral shifts x 3   Assessment/Plan    PT Assessment Patient does not need any further PT services  PT Problem List         PT Treatment Interventions      PT Goals (Current goals can be found in the Care Plan section)  Acute Rehab PT Goals Patient Stated Goal: be able to return to work PT Goal Formulation: All assessment and education complete, DC therapy    Frequency     Barriers to discharge        Co-evaluation               AM-PAC PT "6 Clicks" Mobility  Outcome Measure Help needed turning from your back to your side while in a flat bed without using bedrails?: None Help needed moving from lying on your back to sitting on the side of a flat bed without using bedrails?: None Help needed moving to and from a bed to a chair (including a wheelchair)?: None Help needed standing up from a chair using your arms (e.g., wheelchair or bedside chair)?: None Help needed to walk in hospital room?: None Help needed climbing 3-5 steps with a railing? : None 6 Click Score: 24    End of Session   Activity Tolerance: Patient tolerated treatment well Patient left: in bed;with call bell/phone within reach Nurse Communication: Mobility status PT Visit Diagnosis: Pain Pain - part of body:  (back)    Time: 2409-7353 PT Time Calculation (min) (ACUTE ONLY): 21 min   Charges:   PT  Evaluation $PT Eval Low Complexity: [place], PT, DPT Acute Rehabilitation Services Pager [phone] Office [phone]   Deno Etienne 05/07/2021, 9:55 AM

## 2021-05-07 NOTE — Evaluation (Signed)
Occupational Therapy Evaluation and Discharge Patient Details Name: [name] MRN: [id number] DOB: Oct 09, 1960 Today's Date: 05/07/2021   History of Present Illness This 60 yo female is s/p microlumbar decompression, L5-S1, right; Foraminotomies, L5-S1, right; and lysis of epidural venous plexus tethering the S1 nerve root.   Clinical Impression   This 60 yo female admitted and underwent above presents to acute OT with all education completed, we will D/C from acute OT.      Recommendations for follow up therapy are one component of a multi-disciplinary discharge planning process, led by the attending physician.  Recommendations may be updated based on patient status, additional functional criteria and insurance authorization.   Follow Up Recommendations  No OT follow up    Assistance Recommended at Discharge Intermittent Supervision/Assistance  Functional Status Assessment  Patient has had a recent decline in their functional status and demonstrates the ability to make significant improvements in function in a reasonable and predictable amount of time.  Equipment Recommendations  None recommended by OT       Precautions / Restrictions Precautions Precautions: Back Precaution Booklet Issued: Yes (comment) Restrictions Weight Bearing Restrictions: No      Mobility Bed Mobility Overal bed mobility: Modified Independent             General bed mobility comments: VCs on technique in and OOB no rail and HOB flat    Transfers Overall transfer level: Modified independent Equipment used: Straight cane                      Balance Overall balance assessment: Mild deficits observed, not formally tested                                         ADL either performed or assessed with clinical judgement   ADL Overall ADL's : Needs assistance/impaired Eating/Feeding: Independent;Sitting     Grooming Details (indicate cue type and reason):  Educated on using 2 cups for brushing teeth to avoid bending over sink Upper Body Bathing: Independent;Sitting   Lower Body Bathing: Modified independent;Sit to/from stand   Upper Body Dressing : Independent;Standing   Lower Body Dressing: Modified independent;Sit to/from stand   Toilet Transfer: Programmer, applications Details (indicate cue type and reason): simulated bed>door>sit EOB   Toileting - Clothing Manipulation Details (indicate cue type and reason): Educated on use of wet wipes for back peri care       General ADL Comments: Educated on squat stance or use reacher, educated on not sitting more than than 20-30 minutes     Vision Baseline Vision/History: 1 Wears glasses Ability to See in Adequate Light: 0 Adequate Patient Visual Report: No change from baseline              Pertinent Vitals/Pain Pain Assessment: Faces Faces Pain Scale: Hurts a little bit Pain Location: incisional Pain Descriptors / Indicators: Aching;Sore Pain Intervention(s): Limited activity within patient's tolerance     Hand Dominance Right   Extremity/Trunk Assessment Upper Extremity Assessment Upper Extremity Assessment: Overall WFL for tasks assessed           Communication Communication Communication: No difficulties   Cognition Arousal/Alertness: Awake/alert Behavior During Therapy: WFL for tasks assessed/performed Overall Cognitive Status: Within Functional Limits for tasks assessed  Home Living Family/patient expects to be discharged to:: Private residence Living Arrangements: Spouse/significant other (she cares for him) Available Help at Discharge: Family;Available PRN/intermittently Type of Home: House Home Access: Stairs to enter [employer] of Steps: 3 Entrance Stairs-Rails: Right;Left Home Layout: One level     Bathroom Shower/Tub: Tub/shower unit;Curtain   Bathroom Toilet:  Handicapped height     Home Equipment: Conservation officer, nature (2 wheels);Rollator (4 wheels);Cane - single point;Grab bars - tub/shower          Prior Functioning/Environment Prior Level of Function : Independent/Modified Independent                        OT Problem List: Decreased range of motion;Impaired balance (sitting and/or standing);Pain         OT Goals(Current goals can be found in the care plan section) Acute Rehab OT Goals Patient Stated Goal: to go home today OT Goal Formulation: With patient                AM-PAC OT "6 Clicks" Daily Activity     Outcome Measure Help from another person eating meals?: None Help from another person taking care of personal grooming?: None Help from another person toileting, which includes using toliet, bedpan, or urinal?: None Help from another person bathing (including washing, rinsing, drying)?: None Help from another person to put on and taking off regular upper body clothing?: None Help from another person to put on and taking off regular lower body clothing?: None 6 Click Score: 24   End of Session Equipment Utilized During Treatment:  [facility]) Nurse Communication:  (no OT needs)  Activity Tolerance: Patient tolerated treatment well Patient left:  (sitting EOB with breakfaast)  OT Visit Diagnosis: Other abnormalities of gait and mobility (R26.89);Pain Pain - part of body:  (incisional)                Time: 9242-6834 OT Time Calculation (min): 29 min Charges:  OT General Charges $OT Visit: 1 Visit OT Evaluation $OT Eval Moderate Complexity: 1 Mod OT Treatments $Self Care/Home Management : 8-22 mins  [place]en Circle, OTR/L Acute [employer] Pager [phone] Office [phone]    Almon Register 05/07/2021, 9:35 AM

## 2021-05-07 NOTE — Progress Notes (Signed)
Patient awaiting transport to her vehicle via wheelchair by RN for discharge home; in no acute distress nor complaints of pain nor discomfort; incision on her back with hydrocolloid dressing and is clean, dry and intact; room was checked and accounted for all her belongings; discharge instructions concerning her medications, incision care, follow up appointment and when to call the doctor as needed were all discussed with patient by RN and patient expressed understanding on the instructions given.

## 2021-05-11 ENCOUNTER — Encounter: Payer: Self-pay | Admitting: Nurse Practitioner

## 2021-05-11 ENCOUNTER — Ambulatory Visit (INDEPENDENT_AMBULATORY_CARE_PROVIDER_SITE_OTHER): Payer: BC Managed Care – PPO | Admitting: Nurse Practitioner

## 2021-05-11 ENCOUNTER — Other Ambulatory Visit: Payer: Self-pay

## 2021-05-11 VITALS — BP 133/78 | HR 103 | Temp 97.5°F | Resp 20 | Ht 64.0 in | Wt 178.0 lb

## 2021-05-11 DIAGNOSIS — R531 Weakness: Secondary | ICD-10-CM | POA: Insufficient documentation

## 2021-05-11 NOTE — Assessment & Plan Note (Signed)
Patient is 5 days postop back surgery and presents to clinic today with weakness and poor appetite with tachycardia.  On assessment patient is presenting as a postop patient recovering with no other signs and symptoms of blood clot.  Patient started  on 81 mg aspirin daily on tuesday, no chest pain, shortness of breath, chest tightness, fever, nausea, headache or blurry vision.  I provided education to patient on the signs and symptoms of blood clot and to seek emergency care over the weekend while clinic is closed.  Patient verbalized understanding.  Printed handouts given.

## 2021-05-11 NOTE — Patient Instructions (Signed)
Weakness Weakness is a lack of strength. You may feel weak all over your body (generalized), or you may feel weak in one part of your body (focal). There are many potential causes of weakness. Sometimes, the cause of your weakness may not be known. Some causes of weakness can be serious, so it isimportant to see your doctor. Follow these instructions at home: Activity Rest as needed. Try to get enough sleep. Most adults need 7-8 hours of sleep each night. Talk to your doctor about how much sleep you need each night. Do exercises, such as arm curls and leg raises, for 30 minutes at least 2 days a week or as told by your doctor. Think about working with a physical therapist or trainer to help you get stronger. General instructions  Take over-the-counter and prescription medicines only as told by your doctor. Eat a healthy, well-balanced diet. This includes: Proteins to build muscles, such as lean meats and fish. Fresh fruits and vegetables. Carbohydrates to boost energy, such as whole grains. Drink enough fluid to keep your pee (urine) pale yellow. Keep all follow-up visits as told by your doctor. This is important.  Contact a doctor if: Your weakness does not get better or it gets worse. Your weakness affects your ability to: Think clearly. Do your normal daily activities. Get help right away if you: Have sudden weakness on one side of your face or body. Have chest pain. Have trouble breathing or shortness of breath. Have problems with your vision. Have trouble talking or swallowing. Have trouble standing or walking. Are light-headed. Pass out (lose consciousness). Summary Weakness is a lack of strength. You may feel weak all over your body or just in one part of your body. There are many potential causes of weakness. Sometimes, the cause of your weakness may not be known. Rest as needed, and try to get enough sleep. Most adults need 7-8 hours of sleep each night. Eat a healthy,  well-balanced diet. This information is not intended to replace advice given to you by your health care provider. Make sure you discuss any questions you have with your healthcare provider. Document Revised: 01/09/2018 Document Reviewed: 01/09/2018 Elsevier Patient Education  2022 Elsevier Inc.  

## 2021-05-11 NOTE — Progress Notes (Signed)
Acute Office Visit  Subjective:    Patient ID: [name], female    DOB: 1961-04-19, 60 y.o.   MRN: [id number]  Chief Complaint  Patient presents with   Weakness   no appetite    Weakness Associated symptoms include weakness. Pertinent negatives include no abdominal pain, chest pain, nausea or rash.  Patient is a 60 year old female who presents to clinic today for weakness and low appetite.  Patient had back surgery 5 days ago.  She presents with tachycardia heart rate of 103, blood pressure of 133/78, temperature of 97.5% and oxygen saturation of 98%.  No fever, chills body ache, chest pain or tightness, nausea or vomiting associated with current symptoms.  Past Medical History:  Diagnosis Date   Anxiety    Arthritis    back (12/30/2014)   Asthma    as a child, no problems as an adult, has an albuterol inhaler   Cerebral cyst    per brain MRI 07-12-2019 unchanged 47m cyst inferomedial right frontal lobe   Chronic headaches    Chronic systolic (congestive) heart failure ([facility]    followed by dr nFrances Nickels  Complication of anesthesia    pt is very sensitive to benzodiazepines, pt stated "almost died"   Compression fracture of lumbar vertebra ([facility] 05/27/2020   per pt L3 and L4   D[place] (dilated cardiomyopathy) ([facility]    w/ Takatsubo---  followed by dr nJohnsie Cancel--  stress-induced --- cardiac cath 12-30-2014 ef 30-35%, recovered per cardiac MRI 03-15-2015 ef  64%   Depression    Disorder of mastoid    per brain MRI 07-12-2019 persistant large mastoid effusion   Dysuria    Environmental and seasonal allergies    Frequency of urination    GERD (gastroesophageal reflux disease)    History of asthma    childhood   History of esophageal spasm    History of kidney stones    History of melanoma excision 2001   right supraorbital (right forehead and upper eyelid )  s/p  Moh's procedure w/ sln bx,  no recurrence per pt   History of non-ST elevation myocardial infarction (NSTEMI)  12/29/2014   per cath normal coronaries , ef 30-35%---  stress-- induced Takotsubo syndrome    History of palpitations 01/05/2015   STRESS INDUCED   History of parotid cancer 2008   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-2006 s/p  right lateral parotidectomy w/ nerve dissection , modified radical neck dissection ;  completed  x35 fractions raditation 2008;  no recurrence per pt   Hyperlipidemia    Hypertension    Kidney cysts    Left ureteral calculus    Low back pain    Melanoma ([facility] 2001   face   Muscle spasm of back    Myocardial infarction ([facility] 2016   Pneumonia    as a child   PONV (postoperative nausea and vomiting)    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   Takotsubo syndrome 12/29/2014   cardiologist---  dr nFrances Nickels-- dx NSTEMI -- stress-induced w/ DCM--  per cath 12-30-2014 normal coronaries , ef 30-35%,  recovered ef per cardiac MRI  ef 64%   Urgency of urination    Wears glasses     Past Surgical History:  Procedure Laterality Date   BACK SURGERY     BREAST BIOPSY Bilateral early 2000's   CARDIAC CATHETERIZATION  2006 (APPROX)  [place]   NORMAL   CARDIAC CATHETERIZATION  12/30/2014   CARDIAC CATHETERIZATION N/A 12/30/2014   Procedure: Left Heart Cath and Coronary Angiography;  Surgeon: Wellington Hampshire, MD;  Location: [facility];  Service: Cardiovascular;  Laterality: N/A;   CARDIOVASCULAR STRESS TEST  03-21-2012  DR Johnsie Cancel   NORMAL NUCLEAR STUDY/  NO ISCHEMIA/  EF 63%   COLONOSCOPY  10/2020   COLONOSCOPY WITH PROPOFOL  05/29/2017   CYSTOSCOPY W/ URETERAL STENT PLACEMENT Left 03/26/2013   Procedure: CYSTOSCOPY WITH RETROGRADE PYELOGRAM ;  Surgeon: Molli Hazard, MD;  Location: [facility];  Service: Urology;  Laterality: Left;   CYSTOSCOPY WITH RETROGRADE PYELOGRAM, URETEROSCOPY AND STENT PLACEMENT Bilateral 03/19/2013   Procedure: CYSTOSCOPY WITH RETROGRADE PYELOGRAM, BILATERAL URETEROSCOPY AND  STENT PLACEMENT LEFT URETER,BILATERAL STONE EXTRACTION , HOLMIUM LASER LEFT URETER;  Surgeon: Molli Hazard, MD;  Location: [facility];  Service: Urology;  Laterality: Bilateral;   CYSTOSCOPY WITH STENT PLACEMENT Left 03/26/2013   Procedure: CYSTOSCOPY WITH STENT PLACEMENT;  Surgeon: Molli Hazard, MD;  Location: [facility];  Service: Urology;  Laterality: Left;   CYSTOSCOPY/URETEROSCOPY/HOLMIUM LASER/STENT PLACEMENT Left 07/07/2020   Procedure: CYSTOSCOPY LEFT URETEROSCOPY/HOLMIUM LASER/STENT PLACEMENT;  Surgeon: Lucas Mallow, MD;  Location: [facility];  Service: Urology;  Laterality: Left;   DIAGNOSTIC LAPAROSCOPY  04/12/2009   ESOPHAGOGASTRODUODENOSCOPY (EGD) WITH PROPOFOL N/A 02/10/2016   Procedure: ESOPHAGOGASTRODUODENOSCOPY (EGD) WITH PROPOFOL;  Surgeon: Ronald Lobo, MD;  Location: [facility];  Service: Endoscopy;  Laterality: N/A;   KIDNEY SURGERY  1966   BILATERAL URETER'S DILATATION   LUMBAR LAMINECTOMY/DECOMPRESSION MICRODISCECTOMY  05/18/2011   Procedure: LUMBAR LAMINECTOMY/DECOMPRESSION MICRODISCECTOMY;  Surgeon: Johnn Hai;  Location: [facility];  Service: Orthopedics;  Laterality: Right;  Decompression Lumbar 4-Lumbar 5  Right    (xray)    LUMBAR LAMINECTOMY/DECOMPRESSION MICRODISCECTOMY N/A 05/06/2021   Procedure: Microlumbar decompression L5-S1;  Surgeon: Susa Day, MD;  Location: [facility];  Service: Orthopedics;  Laterality: N/A;  3 C-bed 90 mins   MELANOMA EXCISION WITH SENTINEL LYMPH NODE BIOPSY  2001   moh's procedure/  RIGHT FOREHEAD AND UPPER EYEBROW   RIGHT LATERAL PAROTIDECTOMY W/ NERVE DISSECTION / RIGHT MODIFIED RADICAL NECK DISSECTION SPARING SCM ELEVENTH NERVE AND INTERNAL JUGULAR VEIN  09-12-2006  DR DWIGHT BATES   DR DWIGHT BATES; "inside gland; lots of lymph nodes"    Family History  Problem Relation Age of Onset   Hypertension Mother    COPD Mother    Cancer Mother        breast   Dementia Mother     Heart disease Father    Cancer Father        Colorectal   Hyperlipidemia Father    Hypertension Father     Social History   Socioeconomic History   Marital status: Married    Spouse name: Not on file   Number of children: Not on file   Years of education: Not on file   Highest education level: Not on file  Occupational History   Occupation: Scientist, water quality at [employer] Use   Smoking status: Never   Smokeless tobacco: Never  Vaping Use   Vaping Use: Never used  Substance and Sexual Activity   Alcohol use: Not Currently    Comment: occasional- 1 drink per month   Drug use: Never   Sexual activity: Not on file  Other Topics Concern   Not on file  Social History Narrative   Not on file   Social Determinants of Health  Financial Resource Strain: Not on file  Food Insecurity: Not on file  Transportation Needs: Not on file  Physical Activity: Not on file  Stress: Not on file  Social Connections: Not on file  Intimate Partner Violence: Not on file    Outpatient Medications Prior to Visit  Medication Sig Dispense Refill   acetaminophen (TYLENOL) 500 MG tablet Take 1,000 mg by mouth every 6 (six) hours as needed for moderate pain.     calcium carbonate (TUMS - DOSED IN MG ELEMENTAL CALCIUM) 500 MG chewable tablet Chew 1,000 mg by mouth daily as needed for indigestion or heartburn.     carvedilol (COREG) 3.125 MG tablet TAKE 1 TABLET BY MOUTH TWICE DAILY WITH A MEAL 180 tablet 3   docusate sodium (COLACE) 100 MG capsule Take 1 capsule (100 mg total) by mouth 2 (two) times daily as needed for mild constipation. 30 capsule 1   lidocaine (LIDODERM) 5 % Place 1 patch onto the skin daily as needed (pain).     lisinopril (ZESTRIL) 2.5 MG tablet Take 1 tablet (2.5 mg total) by mouth every morning. 90 tablet 3   methocarbamol (ROBAXIN) 500 MG tablet Take 1 tablet (500 mg total) by mouth every 8 (eight) hours as needed for muscle spasms. 40 tablet 1   nitroGLYCERIN (NITROSTAT) 0.4 MG  SL tablet Place 1 tablet (0.4 mg total) under the tongue every 5 (five) minutes as needed for chest pain. DISSOLVE ONE TABLET UNDER THE TONGUE EVERY 5 MINUTES AS NEEDED FOR CHEST PAIN.  DO NOT EXCEED A TOTAL OF 3 DOSES IN 15 MINUTES 25 tablet 3   pantoprazole (PROTONIX) 40 MG tablet Take 40 mg by mouth daily as needed (GERD).     polyethylene glycol (MIRALAX / GLYCOLAX) 17 g packet Take 17 g by mouth daily. 14 each 0   promethazine (PHENERGAN) 25 MG tablet Take 1 tablet (25 mg total) by mouth every 8 (eight) hours as needed for nausea or vomiting. 30 tablet 0   silver sulfADIAZINE (SILVADENE) 1 % cream Apply 1 application topically daily. Apply to shoulder nightly 7-14 days 50 g 0   oxyCODONE (OXY IR/ROXICODONE) 5 MG immediate release tablet Take 1 tablet (5 mg total) by mouth every 4 (four) hours as needed for severe pain. (Patient not taking: Reported on 05/11/2021) 40 tablet 0   No facility-administered medications prior to visit.    Allergies  Allergen Reactions   Cephalexin Shortness Of Breath and Rash   Lorazepam Shortness Of Breath and Other (See Comments)    She may be very sensitive to benzo.    Stops breathing    Promethazine     Felt strange, "knocked me out"    Review of Systems  Constitutional: Negative.   HENT: Negative.    Eyes: Negative.   Respiratory: Negative.    Cardiovascular:  Negative for chest pain, palpitations and leg swelling.  Gastrointestinal: Negative.  Negative for abdominal distention, abdominal pain, diarrhea and nausea.  Skin: Negative.  Negative for rash.  Neurological:  Positive for weakness.  Psychiatric/Behavioral: Negative.  The patient is not nervous/anxious.   All other systems reviewed and are negative.     Objective:    Physical Exam Vitals and nursing note reviewed.  Constitutional:      Appearance: Normal appearance.  HENT:     Head: Normocephalic.     Right Ear: Ear canal and external ear normal.     Left Ear: Ear canal and  external ear normal.  Nose: Nose normal. No congestion.     Mouth/Throat:     Mouth: Mucous membranes are moist.     Pharynx: Oropharynx is clear.  Eyes:     Conjunctiva/sclera: Conjunctivae normal.  Cardiovascular:     Rate and Rhythm: Tachycardia present.     Pulses: Normal pulses.     Heart sounds: Normal heart sounds.  Pulmonary:     Effort: Pulmonary effort is normal.     Breath sounds: Normal breath sounds.  Abdominal:     General: Bowel sounds are normal.  Musculoskeletal:     Cervical back: Normal range of motion.  Skin:    General: Skin is warm.     Coloration: Skin is not pale.     Findings: No erythema or rash.  Neurological:     Mental Status: She is alert and oriented to person, place, and time.  Psychiatric:        Behavior: Behavior normal.    BP 133/78   Pulse (!) 103   Temp (!) 97.5 F (36.4 C) (Temporal)   Resp 20   Ht _0  (1.626 m)   Wt 178 lb (80.7 kg)   LMP 05/18/2011   SpO2 98%   BMI 30.55 kg/m  Wt Readings from Last 3 Encounters:  05/11/21 178 lb (80.7 kg)  05/06/21 182 lb 1.6 oz (82.6 kg)  05/03/21 182 lb (82.6 kg)    Health Maintenance Due  Topic Date Due   INFLUENZA VACCINE  01/17/2021    There are no preventive care reminders to display for this patient.   Lab Results  Component Value Date   TSH 2.360 07/05/2017   Lab Results  Component Value Date   WBC 5.4 05/03/2021   HGB 13.2 05/03/2021   HCT 39.2 05/03/2021   MCV 94.0 05/03/2021   PLT 169 05/03/2021   Lab Results  Component Value Date   NA 137 05/07/2021   K 4.3 05/07/2021   CO2 26 05/07/2021   GLUCOSE 128 (H) 05/07/2021   BUN 8 05/07/2021   CREATININE 0.77 05/07/2021   BILITOT 0.4 04/15/2021   ALKPHOS 66 04/15/2021   AST 16 04/15/2021   ALT 12 04/15/2021   PROT 6.2 04/15/2021   ALBUMIN 4.5 04/15/2021   CALCIUM 9.0 05/07/2021   ANIONGAP 7 05/07/2021   EGFR 89 04/15/2021   Lab Results  Component Value Date   CHOL 182 12/02/2020   Lab Results   Component Value Date   HDL 58 12/02/2020   Lab Results  Component Value Date   LDLCALC 105 (H) 12/02/2020   Lab Results  Component Value Date   TRIG 108 12/02/2020   Lab Results  Component Value Date   CHOLHDL 3.1 12/02/2020   Lab Results  Component Value Date   HGBA1C 5.5 01/03/2017       Assessment & Plan:   Problem List Items Addressed This Visit       Other   Weakness - Primary    Patient is 5 days postop back surgery and presents to clinic today with weakness and poor appetite with tachycardia.  On assessment patient is presenting as a postop patient recovering with no other signs and symptoms of blood clot.  Patient started  on 81 mg aspirin daily on tuesday, no chest pain, shortness of breath, chest tightness, fever, nausea, headache or blurry vision.  I provided education to patient on the signs and symptoms of blood clot and to seek emergency care over the weekend while clinic  is closed.  Patient verbalized understanding.  Printed handouts given.        No orders of the defined types were placed in this encounter.    Ivy Lynn, NP

## 2021-06-02 ENCOUNTER — Ambulatory Visit (INDEPENDENT_AMBULATORY_CARE_PROVIDER_SITE_OTHER): Payer: BC Managed Care – PPO | Admitting: Dermatology

## 2021-06-02 ENCOUNTER — Other Ambulatory Visit: Payer: Self-pay

## 2021-06-02 ENCOUNTER — Encounter: Payer: Self-pay | Admitting: Dermatology

## 2021-06-02 DIAGNOSIS — D0439 Carcinoma in situ of skin of other parts of face: Secondary | ICD-10-CM | POA: Diagnosis not present

## 2021-06-02 NOTE — Patient Instructions (Signed)

## 2021-06-03 ENCOUNTER — Encounter: Payer: Self-pay | Admitting: Family Medicine

## 2021-06-03 ENCOUNTER — Ambulatory Visit: Payer: BC Managed Care – PPO | Admitting: Family Medicine

## 2021-06-03 ENCOUNTER — Ambulatory Visit (INDEPENDENT_AMBULATORY_CARE_PROVIDER_SITE_OTHER): Payer: BC Managed Care – PPO | Admitting: Family Medicine

## 2021-06-03 ENCOUNTER — Other Ambulatory Visit: Payer: Self-pay

## 2021-06-03 DIAGNOSIS — N3001 Acute cystitis with hematuria: Secondary | ICD-10-CM | POA: Diagnosis not present

## 2021-06-03 DIAGNOSIS — R399 Unspecified symptoms and signs involving the genitourinary system: Secondary | ICD-10-CM

## 2021-06-03 LAB — MICROSCOPIC EXAMINATION: Renal Epithel, UA: NONE SEEN /hpf

## 2021-06-03 LAB — URINALYSIS, COMPLETE
Bilirubin, UA: NEGATIVE
Glucose, UA: NEGATIVE
Ketones, UA: NEGATIVE
Nitrite, UA: NEGATIVE
Protein,UA: NEGATIVE
Specific Gravity, UA: 1.025 (ref 1.005–1.030)
Urobilinogen, Ur: 0.2 mg/dL (ref 0.2–1.0)
pH, UA: 6 (ref 5.0–7.5)

## 2021-06-03 MED ORDER — SULFAMETHOXAZOLE-TRIMETHOPRIM 800-160 MG PO TABS: 1.0000 | ORAL_TABLET | Freq: Two times a day (BID) | ORAL | 0 refills | Status: AC

## 2021-06-03 NOTE — Progress Notes (Signed)
Virtual Visit via telephone note Due to COVID-19 pandemic this visit was conducted virtually. This visit type was conducted due to national recommendations for restrictions regarding the COVID-19 Pandemic (e.g. social distancing, sheltering in place) in an effort to limit this patient's exposure and mitigate transmission in our community. All issues noted in this document were discussed and addressed.  A physical exam was not performed with this format.   I connected with [name] on 06/03/2021 at 1135 by telephone and verified that I am speaking with the correct person using two identifiers. [name] is currently located at home and family is currently with them during visit. The provider, Monia Pouch, FNP is located in their office at time of visit.  I discussed the limitations, risks, security and privacy concerns of performing an evaluation and management service by virtual visit and the availability of in person appointments. I also discussed with the patient that there may be a patient responsible charge related to this service. The patient expressed understanding and agreed to proceed.  Subjective:  Patient ID: [name], female    DOB: [birth year], 60 y.o.   MRN: [id number]  Chief Complaint:  Urinary Tract Infection   HPI: [name] is a 60 y.o. female presenting on 06/03/2021 for Urinary Tract Infection   Patient reports dysuria, urgency, and frequency since Wednesday.  She denies fever, chills, flank pain, weakness, confusion.  No hematuria noted.  Has not tried anything for symptoms.  Urinary Tract Infection  The current episode started in the past 7 days. The problem occurs every urination. The problem has been gradually worsening. The quality of the pain is described as burning. The pain is at a severity of 3/10. The pain is mild. There has been no fever. She is Not sexually active. There is No history of pyelonephritis. Associated symptoms include  frequency and urgency. Pertinent negatives include no chills, discharge, flank pain, hematuria, hesitancy, nausea, possible pregnancy, sweats or vomiting. She has tried nothing for the symptoms.    Relevant past medical, surgical, family, and social history reviewed and updated as indicated.  Allergies and medications reviewed and updated.   Past Medical History:  Diagnosis Date   Anxiety    Arthritis    back (12/30/2014)   Asthma    as a child, no problems as an adult, has an albuterol inhaler   Cerebral cyst    per brain MRI 07-12-2019 unchanged 17mm cyst inferomedial right frontal lobe   Chronic headaches    Chronic systolic (congestive) heart failure ([place])    followed by dr Frances Nickels   Complication of anesthesia    pt is very sensitive to benzodiazepines, pt stated "almost died"   Compression fracture of lumbar vertebra ([place]) 05/27/2020   per pt L3 and L4   DCM (dilated cardiomyopathy) ([place])    w/ Takatsubo---  followed by dr Johnsie Cancel---  stress-induced --- cardiac cath 12-30-2014 ef 30-35%, recovered per cardiac MRI 03-15-2015 ef  64%   Depression    Disorder of mastoid    per brain MRI 07-12-2019 persistant large mastoid effusion   Dysuria    Environmental and seasonal allergies    Frequency of urination    GERD (gastroesophageal reflux disease)    History of asthma    childhood   History of esophageal spasm    History of kidney stones    History of melanoma excision 2001   right supraorbital (right forehead and upper eyelid )  s/p  Moh's  procedure w/ sln bx,  no recurrence per pt   History of non-ST elevation myocardial infarction (NSTEMI) 12/29/2014   per cath normal coronaries , ef 30-35%---  stress-- induced Takotsubo syndrome    History of palpitations 01/05/2015   STRESS INDUCED   History of parotid cancer 2008   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-2006 s/p  right lateral parotidectomy w/ nerve dissection , modified radical neck dissection ;   completed  x35 fractions raditation 2008;  no recurrence per pt   Hyperlipidemia    Hypertension    Kidney cysts    Left ureteral calculus    Low back pain    Melanoma ([place]) 2001   face   Muscle spasm of back    Myocardial infarction ([place]) 2016   Pneumonia    as a child   PONV (postoperative nausea and vomiting)    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   Squamous cell carcinoma of skin 02/22/2021   in istu- right forehead ([id number])   Takotsubo syndrome 12/29/2014   cardiologist---  dr Frances Nickels--- dx NSTEMI -- stress-induced w/ DCM--  per cath 12-30-2014 normal coronaries , ef 30-35%,  recovered ef per cardiac MRI  ef 64%   Urgency of urination    Wears glasses     Past Surgical History:  Procedure Laterality Date   BACK SURGERY     BREAST BIOPSY Bilateral early 2000's   CARDIAC CATHETERIZATION  2006 (APPROX)  [place]   NORMAL   CARDIAC CATHETERIZATION  12/30/2014   CARDIAC CATHETERIZATION N/A 12/30/2014   Procedure: Left Heart Cath and Coronary Angiography;  Surgeon: Wellington Hampshire, MD;  Location: [facility];  Service: Cardiovascular;  Laterality: N/A;   CARDIOVASCULAR STRESS TEST  03-21-2012  DR Johnsie Cancel   NORMAL NUCLEAR STUDY/  NO ISCHEMIA/  EF 63%   COLONOSCOPY  10/2020   COLONOSCOPY WITH PROPOFOL  05/29/2017   CYSTOSCOPY W/ URETERAL STENT PLACEMENT Left 03/26/2013   Procedure: CYSTOSCOPY WITH RETROGRADE PYELOGRAM ;  Surgeon: Molli Hazard, MD;  Location: [facility];  Service: Urology;  Laterality: Left;   CYSTOSCOPY WITH RETROGRADE PYELOGRAM, URETEROSCOPY AND STENT PLACEMENT Bilateral 03/19/2013   Procedure: CYSTOSCOPY WITH RETROGRADE PYELOGRAM, BILATERAL URETEROSCOPY AND STENT PLACEMENT LEFT URETER,BILATERAL STONE EXTRACTION , HOLMIUM LASER LEFT URETER;  Surgeon: Molli Hazard, MD;  Location: [facility];  Service: Urology;  Laterality: Bilateral;   CYSTOSCOPY WITH STENT PLACEMENT Left 03/26/2013   Procedure: CYSTOSCOPY WITH  STENT PLACEMENT;  Surgeon: Molli Hazard, MD;  Location: [facility];  Service: Urology;  Laterality: Left;   CYSTOSCOPY/URETEROSCOPY/HOLMIUM LASER/STENT PLACEMENT Left 07/07/2020   Procedure: CYSTOSCOPY LEFT URETEROSCOPY/HOLMIUM LASER/STENT PLACEMENT;  Surgeon: Lucas Mallow, MD;  Location: [facility];  Service: Urology;  Laterality: Left;   DIAGNOSTIC LAPAROSCOPY  04/12/2009   ESOPHAGOGASTRODUODENOSCOPY (EGD) WITH PROPOFOL N/A 02/10/2016   Procedure: ESOPHAGOGASTRODUODENOSCOPY (EGD) WITH PROPOFOL;  Surgeon: Ronald Lobo, MD;  Location: [facility];  Service: Endoscopy;  Laterality: N/A;   KIDNEY SURGERY  1966   BILATERAL URETER'S DILATATION   LUMBAR LAMINECTOMY/DECOMPRESSION MICRODISCECTOMY  05/18/2011   Procedure: LUMBAR LAMINECTOMY/DECOMPRESSION MICRODISCECTOMY;  Surgeon: Johnn Hai;  Location: [facility];  Service: Orthopedics;  Laterality: Right;  Decompression Lumbar 4-Lumbar 5  Right    (xray)    LUMBAR LAMINECTOMY/DECOMPRESSION MICRODISCECTOMY N/A 05/06/2021   Procedure: Microlumbar decompression L5-S1;  Surgeon: Susa Day, MD;  Location: [facility];  Service: Orthopedics;  Laterality: N/A;  3 C-bed 90 mins   MELANOMA EXCISION WITH SENTINEL LYMPH NODE BIOPSY  2001   moh's procedure/  RIGHT FOREHEAD AND UPPER EYEBROW   RIGHT LATERAL PAROTIDECTOMY W/ NERVE DISSECTION / RIGHT MODIFIED RADICAL NECK DISSECTION SPARING SCM ELEVENTH NERVE AND INTERNAL JUGULAR VEIN  09-12-2006  DR DWIGHT BATES   DR DWIGHT BATES; "inside gland; lots of lymph nodes"    Social History   Socioeconomic History   Marital status: Married    Spouse name: Not on file   Number of children: Not on file   Years of education: Not on file   Highest education level: Not on file  Occupational History   Occupation: Scientist, water quality at [employer] Use   Smoking status: Never   Smokeless tobacco: Never  Vaping Use   Vaping Use: Never used  Substance and Sexual Activity    Alcohol use: Not Currently    Comment: occasional- 1 drink per month   Drug use: Never   Sexual activity: Not on file  Other Topics Concern   Not on file  Social History Narrative   Not on file   Social Determinants of Health   Financial Resource Strain: Not on file  Food Insecurity: Not on file  Transportation Needs: Not on file  Physical Activity: Not on file  Stress: Not on file  Social Connections: Not on file  Intimate Partner Violence: Not on file    Outpatient Encounter Medications as of 06/03/2021  Medication Sig   sulfamethoxazole-trimethoprim (BACTRIM DS) 800-160 MG tablet Take 1 tablet by mouth 2 (two) times daily for 5 days.   acetaminophen (TYLENOL) 500 MG tablet Take 1,000 mg by mouth every 6 (six) hours as needed for moderate pain.   calcium carbonate (TUMS - DOSED IN MG ELEMENTAL CALCIUM) 500 MG chewable tablet Chew 1,000 mg by mouth daily as needed for indigestion or heartburn.   carvedilol (COREG) 3.125 MG tablet TAKE 1 TABLET BY MOUTH TWICE DAILY WITH A MEAL   docusate sodium (COLACE) 100 MG capsule Take 1 capsule (100 mg total) by mouth 2 (two) times daily as needed for mild constipation.   lidocaine (LIDODERM) 5 % Place 1 patch onto the skin daily as needed (pain).   lisinopril (ZESTRIL) 2.5 MG tablet Take 1 tablet (2.5 mg total) by mouth every morning.   methocarbamol (ROBAXIN) 500 MG tablet Take 1 tablet (500 mg total) by mouth every 8 (eight) hours as needed for muscle spasms.   nitroGLYCERIN (NITROSTAT) 0.4 MG SL tablet Place 1 tablet (0.4 mg total) under the tongue every 5 (five) minutes as needed for chest pain. DISSOLVE ONE TABLET UNDER THE TONGUE EVERY 5 MINUTES AS NEEDED FOR CHEST PAIN.  DO NOT EXCEED A TOTAL OF 3 DOSES IN 15 MINUTES   oxyCODONE (OXY IR/ROXICODONE) 5 MG immediate release tablet Take 1 tablet (5 mg total) by mouth every 4 (four) hours as needed for severe pain.   pantoprazole (PROTONIX) 40 MG tablet Take 40 mg by mouth daily as needed  (GERD).   polyethylene glycol (MIRALAX / GLYCOLAX) 17 g packet Take 17 g by mouth daily.   promethazine (PHENERGAN) 25 MG tablet Take 1 tablet (25 mg total) by mouth every 8 (eight) hours as needed for nausea or vomiting.   silver sulfADIAZINE (SILVADENE) 1 % cream Apply 1 application topically daily. Apply to shoulder nightly 7-14 days   No facility-administered encounter medications on file as of 06/03/2021.    Allergies  Allergen Reactions   Cephalexin Shortness Of  Breath and Rash   Lorazepam Shortness Of Breath and Other (See Comments)    She may be very sensitive to benzo.    Stops breathing    Promethazine     Felt strange, "knocked me out"    Review of Systems  Constitutional:  Negative for chills.  Gastrointestinal:  Negative for abdominal pain, nausea and vomiting.  Genitourinary:  Positive for dysuria, frequency and urgency. Negative for decreased urine volume, difficulty urinating, enuresis, flank pain, genital sores, hematuria, hesitancy, pelvic pain, vaginal bleeding, vaginal discharge and vaginal pain.  Musculoskeletal:  Negative for back pain.  Neurological:  Negative for weakness.  Psychiatric/Behavioral:  Negative for confusion.   All other systems reviewed and are negative.       Observations/Objective: No vital signs or physical exam, this was a virtual health encounter.  Pt alert and oriented, answers all questions appropriately, and able to speak in full sentences.   Analysis in office positive for leukocytes, RBCs, and bacteria.  Negative for nitrites.  Assessment and Plan: Janise was seen today for urinary tract infection.  Diagnoses and all orders for this visit:  UTI symptoms Acute cystitis with hematuria Urinalysis as noted.  No reported symptoms concerning for acute pyelonephritis.  Will initiate Bactrim as prescribed.  Culture pending, will change therapy if warranted.  Symptomatic care discussed in detail.  Patient aware to increase water intake  and avoid bladder irritants.  Report any new, worsening, or persistent symptoms. -     Urinalysis, Complete -     Urine Culture -     Microscopic Examination -     sulfamethoxazole-trimethoprim (BACTRIM DS) 800-160 MG tablet; Take 1 tablet by mouth 2 (two) times daily for 5 days.    Follow Up Instructions: Return if symptoms worsen or fail to improve.    I discussed the assessment and treatment plan with the patient. The patient was provided an opportunity to ask questions and all were answered. The patient agreed with the plan and demonstrated an understanding of the instructions.   The patient was advised to call back or seek an in-person evaluation if the symptoms worsen or if the condition fails to improve as anticipated.  The above assessment and management plan was discussed with the patient. The patient verbalized understanding of and has agreed to the management plan. Patient is aware to call the clinic if they develop any new symptoms or if symptoms persist or worsen. Patient is aware when to return to the clinic for a follow-up visit. Patient educated on when it is appropriate to go to the emergency department.    I provided 15 minutes of time during this telephone encounter.   Monia Pouch, FNP-C [facility] [address] [place], [place] [address] [phone] 06/03/2021

## 2021-06-08 LAB — URINE CULTURE

## 2021-06-09 ENCOUNTER — Telehealth: Payer: Self-pay | Admitting: Family Medicine

## 2021-06-09 DIAGNOSIS — R399 Unspecified symptoms and signs involving the genitourinary system: Secondary | ICD-10-CM

## 2021-06-09 NOTE — Telephone Encounter (Signed)
Pt going to come by in the afternoon tomorrow to leave sample.

## 2021-06-10 ENCOUNTER — Other Ambulatory Visit: Payer: BC Managed Care – PPO

## 2021-06-10 DIAGNOSIS — R399 Unspecified symptoms and signs involving the genitourinary system: Secondary | ICD-10-CM

## 2021-06-10 LAB — MICROSCOPIC EXAMINATION
Bacteria, UA: NONE SEEN
Epithelial Cells (non renal): NONE SEEN /hpf (ref 0–10)
Renal Epithel, UA: NONE SEEN /hpf
WBC, UA: NONE SEEN /hpf (ref 0–5)

## 2021-06-10 LAB — URINALYSIS, ROUTINE W REFLEX MICROSCOPIC
Bilirubin, UA: NEGATIVE
Glucose, UA: NEGATIVE
Leukocytes,UA: NEGATIVE
Nitrite, UA: NEGATIVE
Specific Gravity, UA: >1.03 — ABNORMAL HIGH (ref 1.005–1.030)
Urobilinogen, Ur: 0.2 mg/dL (ref 0.2–1.0)
pH, UA: 5.5 (ref 5.0–7.5)

## 2021-06-14 LAB — URINE CULTURE: Organism ID, Bacteria: NO GROWTH

## 2021-06-24 ENCOUNTER — Telehealth: Payer: Self-pay | Admitting: Family Medicine

## 2021-06-24 ENCOUNTER — Encounter: Payer: Self-pay | Admitting: Family Medicine

## 2021-06-24 ENCOUNTER — Ambulatory Visit (INDEPENDENT_AMBULATORY_CARE_PROVIDER_SITE_OTHER): Payer: BC Managed Care – PPO | Admitting: Family Medicine

## 2021-06-24 VITALS — BP 128/72 | HR 70 | Ht 64.0 in | Wt 189.0 lb

## 2021-06-24 DIAGNOSIS — E782 Mixed hyperlipidemia: Secondary | ICD-10-CM | POA: Diagnosis not present

## 2021-06-24 DIAGNOSIS — K219 Gastro-esophageal reflux disease without esophagitis: Secondary | ICD-10-CM | POA: Diagnosis not present

## 2021-06-24 DIAGNOSIS — Z8589 Personal history of malignant neoplasm of other organs and systems: Secondary | ICD-10-CM

## 2021-06-24 DIAGNOSIS — I10 Essential (primary) hypertension: Secondary | ICD-10-CM

## 2021-06-24 LAB — CMP14+EGFR
ALT: 24 IU/L (ref 0–32)
AST: 26 IU/L (ref 0–40)
Albumin/Globulin Ratio: 2.1 (ref 1.2–2.2)
Albumin: 4.5 g/dL (ref 3.8–4.8)
Alkaline Phosphatase: 64 IU/L (ref 44–121)
BUN/Creatinine Ratio: 19 (ref 12–28)
BUN: 17 mg/dL (ref 8–27)
Bilirubin Total: 0.2 mg/dL (ref 0.0–1.2)
CO2: 27 mmol/L (ref 20–29)
Calcium: 9 mg/dL (ref 8.7–10.3)
Chloride: 105 mmol/L (ref 96–106)
Creatinine, Ser: 0.9 mg/dL (ref 0.57–1.00)
Globulin, Total: 2.1 g/dL (ref 1.5–4.5)
Glucose: 90 mg/dL (ref 70–99)
Potassium: 4.3 mmol/L (ref 3.5–5.2)
Sodium: 142 mmol/L (ref 134–144)
Total Protein: 6.6 g/dL (ref 6.0–8.5)
eGFR: 72 mL/min/{1.73_m2} (ref >59)

## 2021-06-24 LAB — LIPID PANEL
Chol/HDL Ratio: 5.2 ratio — ABNORMAL HIGH (ref 0.0–4.4)
Cholesterol, Total: 299 mg/dL — ABNORMAL HIGH (ref 100–199)
HDL: 57 mg/dL (ref >39)
LDL Chol Calc (NIH): 212 mg/dL — ABNORMAL HIGH (ref 0–99)
Triglycerides: 162 mg/dL — ABNORMAL HIGH (ref 0–149)
VLDL Cholesterol Cal: 30 mg/dL (ref 5–40)

## 2021-06-24 LAB — CBC WITH DIFFERENTIAL/PLATELET
Basophils Absolute: 0 10*3/uL (ref 0.0–0.2)
Basos: 0 %
EOS (ABSOLUTE): 0.1 10*3/uL (ref 0.0–0.4)
Eos: 3 %
Hematocrit: 36.7 % (ref 34.0–46.6)
Hemoglobin: 12.3 g/dL (ref 11.1–15.9)
Immature Grans (Abs): 0 10*3/uL (ref 0.0–0.1)
Immature Granulocytes: 0 %
Lymphocytes Absolute: 1.4 10*3/uL (ref 0.7–3.1)
Lymphs: 34 %
MCH: 31.7 pg (ref 26.6–33.0)
MCHC: 33.5 g/dL (ref 31.5–35.7)
MCV: 95 fL (ref 79–97)
Monocytes Absolute: 0.4 10*3/uL (ref 0.1–0.9)
Monocytes: 10 %
Neutrophils Absolute: 2.2 10*3/uL (ref 1.4–7.0)
Neutrophils: 53 %
Platelets: 169 10*3/uL (ref 150–450)
RBC: 3.88 x10E6/uL (ref 3.77–5.28)
RDW: 13 % (ref 11.7–15.4)
WBC: 4.1 10*3/uL (ref 3.4–10.8)

## 2021-06-24 NOTE — Telephone Encounter (Signed)
Placed oncology referral for the patient

## 2021-06-24 NOTE — Telephone Encounter (Signed)
Pt aware referral as placed

## 2021-06-24 NOTE — Progress Notes (Signed)
° °BP 128/72    Pulse 70    Ht 5' 4" (1.626 m)    Wt 189 lb (85.7 kg)    LMP 05/18/2011    SpO2 95%    BMI 32.44 kg/m²   ° °Subjective:  ° °Patient ID: [name], female    DOB: 09/24/1960, 60 y.o.   MRN: [id number] ° °HPI: °[name] is a 60 y.o. female presenting on 06/24/2021 for Medical Management of Chronic Issues, Hyperlipidemia, and Hypertension ° ° °HPI °Hypertension °Patient is currently on carvedilol and lisinopril, and their blood pressure today is 128/72. Patient denies any lightheadedness or dizziness. Patient denies headaches, blurred vision, chest pains, shortness of breath, or weakness. Denies any side effects from medication and is content with current medication.  ° °Hyperlipidemia °Patient is coming in for recheck of his hyperlipidemia. The patient is currently taking no medication currently, focusing on diet. They deny any issues with myalgias or history of liver damage from it. They deny any focal numbness or weakness or chest pain.  ° °GERD °Patient is currently on pantoprazole.  She denies any major symptoms or abdominal pain or belching or burping. She denies any blood in her stool or lightheadedness or dizziness.  ° °Relevant past medical, surgical, family and social history reviewed and updated as indicated. Interim medical history since our last visit reviewed. °Allergies and medications reviewed and updated. ° °Review of Systems  °Constitutional:  Negative for chills and fever.  °Eyes:  Negative for visual disturbance.  °Respiratory:  Negative for chest tightness and shortness of breath.   °Cardiovascular:  Negative for chest pain and leg swelling.  °Skin:  Negative for rash.  °Neurological:  Negative for light-headedness and headaches.  °Psychiatric/Behavioral:  Negative for agitation and behavioral problems.   °All other systems reviewed and are negative. ° °Per HPI unless specifically indicated above ° ° °Allergies as of 06/24/2021   ° °   Reactions  ° Cephalexin Shortness Of  Breath, Rash  ° Lorazepam Shortness Of Breath, Other (See Comments)  ° She may be very sensitive to benzo.    °Stops breathing   ° Promethazine   ° Felt strange, "knocked me out"  ° °  ° °  °Medication List  °  ° °  ° Accurate as of June 24, 2021 10:42 AM. If you have any questions, ask your nurse or doctor.  °  °  ° °  ° °STOP taking these medications   ° °oxyCODONE 5 MG immediate release tablet °Commonly known as: Oxy IR/ROXICODONE °Stopped by: Joshua A Dettinger, MD °  °promethazine 25 MG tablet °Commonly known as: PHENERGAN °Stopped by: Joshua A Dettinger, MD °  ° °  ° °TAKE these medications   ° °acetaminophen 500 MG tablet °Commonly known as: TYLENOL °Take 1,000 mg by mouth every 6 (six) hours as needed for moderate pain. °  °calcium carbonate 500 MG chewable tablet °Commonly known as: TUMS - dosed in mg elemental calcium °Chew 1,000 mg by mouth daily as needed for indigestion or heartburn. °  °carvedilol 3.125 MG tablet °Commonly known as: COREG °TAKE 1 TABLET BY MOUTH TWICE DAILY WITH A MEAL °  °docusate sodium 100 MG capsule °Commonly known as: Colace °Take 1 capsule (100 mg total) by mouth 2 (two) times daily as needed for mild constipation. °  °lidocaine 5 % °Commonly known as: LIDODERM °Place 1 patch onto the skin daily as needed (pain). °  °lisinopril 2.5 MG tablet °Commonly known as: ZESTRIL °  Take 1 tablet (2.5 mg total) by mouth every morning. °  °methocarbamol 500 MG tablet °Commonly known as: Robaxin °Take 1 tablet (500 mg total) by mouth every 8 (eight) hours as needed for muscle spasms. °  °nitroGLYCERIN 0.4 MG SL tablet °Commonly known as: NITROSTAT °Place 1 tablet (0.4 mg total) under the tongue every 5 (five) minutes as needed for chest pain. DISSOLVE ONE TABLET UNDER THE TONGUE EVERY 5 MINUTES AS NEEDED FOR CHEST PAIN.  DO NOT EXCEED A TOTAL OF 3 DOSES IN 15 MINUTES °  °pantoprazole 40 MG tablet °Commonly known as: PROTONIX °Take 40 mg by mouth daily as needed (GERD). °  °polyethylene glycol  17 g packet °Commonly known as: MIRALAX / GLYCOLAX °Take 17 g by mouth daily. °  °silver sulfADIAZINE 1 % cream °Commonly known as: Silvadene °Apply 1 application topically daily. Apply to shoulder nightly 7-14 days °  ° °  ° ° ° °Objective:  ° °BP 128/72    Pulse 70    Ht 5' 4" (1.626 m)    Wt 189 lb (85.7 kg)    LMP 05/18/2011    SpO2 95%    BMI 32.44 kg/m²   °Wt Readings from Last 3 Encounters:  °06/24/21 189 lb (85.7 kg)  °05/11/21 178 lb (80.7 kg)  °05/06/21 182 lb 1.6 oz (82.6 kg)  °  °Physical Exam °Vitals and nursing note reviewed.  °Constitutional:   °   General: She is not in acute distress. °   Appearance: She is well-developed. She is not diaphoretic.  °Eyes:  °   Conjunctiva/sclera: Conjunctivae normal.  °Cardiovascular:  °   Rate and Rhythm: Normal rate and regular rhythm.  °   Heart sounds: Normal heart sounds. No murmur heard. °Pulmonary:  °   Effort: Pulmonary effort is normal. No respiratory distress.  °   Breath sounds: Normal breath sounds. No wheezing.  °Musculoskeletal:     °   General: No swelling or tenderness.  °Skin: °   General: Skin is warm and dry.  °   Findings: No rash.  °Neurological:  °   Mental Status: She is alert and oriented to person, place, and time.  °   Coordination: Coordination normal.  °Psychiatric:     °   Behavior: Behavior normal.  ° ° ° ° °Assessment & Plan:  ° °Problem List Items Addressed This Visit   ° °  ° Cardiovascular and Mediastinum  ° Essential (primary) hypertension (Chronic)  ° Relevant Orders  ° CMP14+EGFR  ° Lipid panel  °  ° Digestive  ° GERD (gastroesophageal reflux disease)  ° Relevant Orders  ° CBC with Differential/Platelet  °  ° Other  ° Hyperlipidemia - Primary (Chronic)  ° Relevant Orders  ° Lipid panel  °  °Continue current medicine, no changes.-She feels like she has some changes on her skin or dimpling on her face where she had cancer previously, recommended for her to call up her oncologist and see if they want to do a scan to see if they  thought it was anything significant. °Follow up plan: °Return in about 6 months (around 12/22/2021), or if symptoms worsen or fail to improve, for Physical exam and hypertension and hyperlipidemia. ° °Counseling provided for all of the vaccine components °Orders Placed This Encounter  °Procedures  ° CBC with Differential/Platelet  ° CMP14+EGFR  ° Lipid panel  ° ° °Joshua Dettinger, MD °[facility] °06/24/2021, 10:42 AM ° ° ° ° °

## 2021-06-28 ENCOUNTER — Encounter: Payer: Self-pay | Admitting: Dermatology

## 2021-06-28 NOTE — Progress Notes (Signed)
° °  Follow-Up Visit   Subjective  [name] is a 61 y.o. female who presents for the following: Procedure (Here for treatment- right forehead - scc x 1).  Carcinoma in situ right forehead Location:  Duration:  Quality:  Associated Signs/Symptoms: Modifying Factors:  Severity:  Timing: Context:   Objective  Well appearing patient in no apparent distress; mood and affect are within normal limits. Right Forehead Biopsy site identified by nurse, patient, and me    A focused examination was performed including head and neck. Relevant physical exam findings are noted in the Assessment and Plan.   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  Destruction of lesion - Right Forehead Complexity: simple   Destruction method: electrodesiccation and curettage   Informed consent: discussed and consent obtained   Timeout:  patient name, date of birth, surgical site, and procedure verified Anesthesia: the lesion was anesthetized in a standard fashion   Anesthetic:  1% lidocaine w/ epinephrine 1-100,000 local infiltration Curettage performed in three different directions: Yes   Curettage cycles:  3 Lesion length (cm):  1.6 Lesion width (cm):  1.6 Margin per side (cm):  0 Final wound size (cm):  1.6 Hemostasis achieved with:  ferric subsulfate Outcome: patient tolerated procedure well with no complications   Additional details:  Wound innoculated with 5 fluorouracil solution.      I, Lavonna Monarch, MD, have reviewed all documentation for this visit.  The documentation on 06/28/21 for the exam, diagnosis, procedures, and orders are all accurate and complete.

## 2021-07-01 ENCOUNTER — Telehealth: Payer: Self-pay | Admitting: Family Medicine

## 2021-07-06 ENCOUNTER — Telehealth: Payer: Self-pay | Admitting: Hematology and Oncology

## 2021-07-06 NOTE — Telephone Encounter (Signed)
Scheduled appt per 1/6 referral. Spoke to pt who is aware of appt date and time. Pt is aware to arrive 15 mins prior to appt time.

## 2021-07-17 ENCOUNTER — Other Ambulatory Visit: Payer: Self-pay | Admitting: Cardiovascular Disease

## 2021-07-21 ENCOUNTER — Inpatient Hospital Stay: Payer: BC Managed Care – PPO

## 2021-07-21 ENCOUNTER — Other Ambulatory Visit: Payer: Self-pay

## 2021-07-21 ENCOUNTER — Inpatient Hospital Stay: Payer: BC Managed Care – PPO | Attending: Hematology and Oncology | Admitting: Hematology and Oncology

## 2021-07-21 VITALS — BP 151/70 | HR 76 | Temp 97.7°F | Resp 18 | Wt 197.6 lb

## 2021-07-21 DIAGNOSIS — R238 Other skin changes: Secondary | ICD-10-CM | POA: Insufficient documentation

## 2021-07-21 DIAGNOSIS — R233 Spontaneous ecchymoses: Secondary | ICD-10-CM | POA: Insufficient documentation

## 2021-07-21 DIAGNOSIS — Z85820 Personal history of malignant melanoma of skin: Secondary | ICD-10-CM | POA: Insufficient documentation

## 2021-07-21 DIAGNOSIS — C07 Malignant neoplasm of parotid gland: Secondary | ICD-10-CM

## 2021-07-21 DIAGNOSIS — Z800 Family history of malignant neoplasm of digestive organs: Secondary | ICD-10-CM

## 2021-07-21 DIAGNOSIS — Z803 Family history of malignant neoplasm of breast: Secondary | ICD-10-CM

## 2021-07-21 DIAGNOSIS — Z85828 Personal history of other malignant neoplasm of skin: Secondary | ICD-10-CM | POA: Diagnosis not present

## 2021-07-21 DIAGNOSIS — Z923 Personal history of irradiation: Secondary | ICD-10-CM | POA: Insufficient documentation

## 2021-07-21 DIAGNOSIS — Z85858 Personal history of malignant neoplasm of other endocrine glands: Secondary | ICD-10-CM

## 2021-07-21 NOTE — Progress Notes (Signed)
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 I did discuss that this is a very rare tumor and it is unusual to recur after 15 years. I wonder if this could be muscle wasting from nerve injury over time however her progressive symptoms are certainly concerning Since I didn't see her before, it is hard to compare her previous appearance to her current appearance. I did discuss the patient's presentation with our neuroradiology, they suggested CT neck which would also include face for evaluation. This presentation is less consistent with melanoma recurrence. I discussed this with the patient. She is agreeable to repeat imaging. If there is indeed recurrence, we will send her to Dr Tammi Klippel and possibly [facility] for opinion since this is a very rare malignancy. She will RTC in 2/3 weeks for imaging review and treatment recommendations. I tried to review some of her old records but not all of them are available to review.  HISTORY OF PRESENTING ILLNESS:  [name] 61 y.o. female is here because of history of malignant myoepithelioma of right parotid gland  2001, melanoma of right eyebrow,  mohr's surgery in [place]. She didn't get any adjuvant therapy. She doesn't believe there is LN involvement. Dr Lacinda Axon was the surgeon. 2008, right parotid malignant myoepithelioma, treated with surgery and radiation by Dr Tammi Klippel. Dr Redmond Baseman is the surgeon. She now has noted caving of the right face for the past 6 months, getting worse, asymmetry. She has also noticed spontaneous bruising of the right eyelid a couple days ago. NO speech changes, swallowing changes, pulling in the cheek feeling is present. No difficulty breathing. No changes in bowel habits or trouble urinating. She has history of kidney stones, no UTI, had previous procedures. History of Takotsubo cardiomyopathy, resolved.  She has been going to her dentist who has been examining her but did not feel like these changes were significant.  She last saw Dr. Redmond Baseman early 2021.  She has not seen Dr. Tammi Klippel in a few years. Rest of the pertinent 10 point ROS reviewed and negative.  MEDICAL HISTORY:  Past Medical History:  Diagnosis Date   Anxiety    Arthritis    back (12/30/2014)   Asthma    as a child, no problems as an adult, has an albuterol inhaler   Cerebral cyst    per brain MRI 07-12-2019 unchanged 85mm cyst inferomedial right frontal lobe   Chronic headaches    Chronic systolic (congestive) heart failure ([place])    followed by dr Frances Nickels   Complication of anesthesia    pt is very sensitive to benzodiazepines, pt stated "almost died"   Compression fracture of lumbar vertebra ([place]) 05/27/2020   per pt L3 and L4   DCM (dilated cardiomyopathy) (  [place])    w/ Takatsubo---  followed by dr Johnsie Cancel---  stress-induced --- cardiac cath 12-30-2014 ef 30-35%, recovered per cardiac MRI 03-15-2015 ef  64%   Depression    Disorder of mastoid    per brain MRI 07-12-2019 persistant large mastoid effusion   Dysuria    Environmental and seasonal allergies    Frequency of urination    GERD (gastroesophageal reflux disease)    History of asthma     childhood   History of esophageal spasm    History of kidney stones    History of melanoma excision 2001   right supraorbital (right forehead and upper eyelid )  s/p  Moh's procedure w/ sln bx,  no recurrence per pt   History of non-ST elevation myocardial infarction (NSTEMI) 12/29/2014   per cath normal coronaries , ef 30-35%---  stress-- induced Takotsubo syndrome    History of palpitations 01/05/2015   STRESS INDUCED   History of parotid cancer 2008   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-2006 s/p  right lateral parotidectomy w/ nerve dissection , modified radical neck dissection ;  completed  x35 fractions raditation 2008;  no recurrence per pt   Hyperlipidemia    Hypertension    Kidney cysts    Left ureteral calculus    Low back pain    Melanoma ([place]) 2001   face   Muscle spasm of back    Myocardial infarction ([place]) 2016   Pneumonia    as a child   PONV (postoperative nausea and vomiting)    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   Squamous cell carcinoma of skin 02/22/2021   in [place]- right forehead ([id number])   Takotsubo syndrome 12/29/2014   cardiologist---  dr Frances Nickels--- dx NSTEMI -- stress-induced w/ DCM--  per cath 12-30-2014 normal coronaries , ef 30-35%,  recovered ef per cardiac MRI  ef 64%   Urgency of urination    Wears glasses     SURGICAL HISTORY: Past Surgical History:  Procedure Laterality Date   BACK SURGERY     BREAST BIOPSY Bilateral early 2000's   CARDIAC CATHETERIZATION  2006 (APPROX)  [place]   NORMAL   CARDIAC CATHETERIZATION  12/30/2014   CARDIAC CATHETERIZATION N/A 12/30/2014   Procedure: Left Heart Cath and Coronary Angiography;  Surgeon: Wellington Hampshire, MD;  Location: [facility];  Service: Cardiovascular;  Laterality: N/A;   CARDIOVASCULAR STRESS TEST  03-21-2012  DR Johnsie Cancel   NORMAL NUCLEAR STUDY/  NO ISCHEMIA/  EF 63%   COLONOSCOPY  10/2020   COLONOSCOPY WITH PROPOFOL  05/29/2017   CYSTOSCOPY W/  URETERAL STENT PLACEMENT Left 03/26/2013   Procedure: CYSTOSCOPY WITH RETROGRADE PYELOGRAM ;  Surgeon: Molli Hazard, MD;  Location: [facility];  Service: Urology;  Laterality: Left;   CYSTOSCOPY WITH RETROGRADE PYELOGRAM, URETEROSCOPY AND STENT PLACEMENT Bilateral 03/19/2013   Procedure: CYSTOSCOPY WITH RETROGRADE PYELOGRAM, BILATERAL URETEROSCOPY AND STENT PLACEMENT LEFT URETER,BILATERAL STONE EXTRACTION , HOLMIUM LASER LEFT URETER;  Surgeon: Molli Hazard, MD;  Location: [facility];  Service: Urology;  Laterality: Bilateral;   CYSTOSCOPY WITH STENT PLACEMENT Left 03/26/2013   Procedure: CYSTOSCOPY WITH STENT PLACEMENT;  Surgeon: Molli Hazard, MD;  Location: [facility];  Service: Urology;  Laterality: Left;   CYSTOSCOPY/URETEROSCOPY/HOLMIUM LASER/STENT PLACEMENT Left 07/07/2020   Procedure: CYSTOSCOPY LEFT URETEROSCOPY/HOLMIUM LASER/STENT PLACEMENT;  Surgeon: Lucas Mallow, MD;  Location[facility];  Service: Urology;  Laterality:  Left;   DIAGNOSTIC LAPAROSCOPY  04/12/2009   ESOPHAGOGASTRODUODENOSCOPY (EGD) WITH PROPOFOL N/A 02/10/2016   Procedure: ESOPHAGOGASTRODUODENOSCOPY (EGD) WITH PROPOFOL;  Surgeon: Ronald Lobo, MD;  Location: [facility];  Service: Endoscopy;  Laterality: N/A;   KIDNEY SURGERY  1966   BILATERAL URETER'S DILATATION   LUMBAR LAMINECTOMY/DECOMPRESSION MICRODISCECTOMY  05/18/2011   Procedure: LUMBAR LAMINECTOMY/DECOMPRESSION MICRODISCECTOMY;  Surgeon: Johnn Hai;  Location: [facility];  Service: Orthopedics;  Laterality: Right;  Decompression Lumbar 4-Lumbar 5  Right    (xray)    LUMBAR LAMINECTOMY/DECOMPRESSION MICRODISCECTOMY N/A 05/06/2021   Procedure: Microlumbar decompression L5-S1;  Surgeon: Susa Day, MD;  Location: [facility];  Service: Orthopedics;  Laterality: N/A;  3 C-bed 90 mins   MELANOMA EXCISION WITH SENTINEL LYMPH NODE BIOPSY  2001   moh's procedure/  RIGHT FOREHEAD AND UPPER  EYEBROW   RIGHT LATERAL PAROTIDECTOMY W/ NERVE DISSECTION / RIGHT MODIFIED RADICAL NECK DISSECTION SPARING SCM ELEVENTH NERVE AND INTERNAL JUGULAR VEIN  09-12-2006  DR DWIGHT BATES   DR DWIGHT BATES; "inside gland; lots of lymph nodes"    SOCIAL HISTORY: Social History   Socioeconomic History   Marital status: Married    Spouse name: Not on file   Number of children: Not on file   Years of education: Not on file   Highest education level: Not on file  Occupational History   Occupation: Scientist, water quality at [employer] Use   Smoking status: Never   Smokeless tobacco: Never  Vaping Use   Vaping Use: Never used  Substance and Sexual Activity   Alcohol use: Not Currently    Comment: occasional- 1 drink per month   Drug use: Never   Sexual activity: Not on file  Other Topics Concern   Not on file  Social History Narrative   Not on file   Social Determinants of Health   Financial Resource Strain: Not on file  Food Insecurity: Not on file  Transportation Needs: Not on file  Physical Activity: Not on file  Stress: Not on file  Social Connections: Not on file  Intimate Partner Violence: Not on file    FAMILY HISTORY: Family History  Problem Relation Age of Onset   Hypertension Mother    COPD Mother    Cancer Mother        breast   Dementia Mother    Heart disease Father    Cancer Father        Colorectal   Hyperlipidemia Father    Hypertension Father     ALLERGIES:  is allergic to cephalexin, lorazepam, and promethazine.  MEDICATIONS:  Current Outpatient Medications  Medication Sig Dispense Refill   acetaminophen (TYLENOL) 500 MG tablet Take 1,000 mg by mouth every 6 (six) hours as needed for moderate pain.     calcium carbonate (TUMS - DOSED IN MG ELEMENTAL CALCIUM) 500 MG chewable tablet Chew 1,000 mg by mouth daily as needed for indigestion or heartburn.     carvedilol (COREG) 3.125 MG tablet TAKE 1 TABLET BY MOUTH TWICE DAILY WITH A MEAL 180 tablet 1   docusate  sodium (COLACE) 100 MG capsule Take 1 capsule (100 mg total) by mouth 2 (two) times daily as needed for mild constipation. 30 capsule 1   lidocaine (LIDODERM) 5 % Place 1 patch onto the skin daily as needed (pain).     lisinopril (ZESTRIL) 2.5 MG tablet TAKE 1 TABLET BY MOUTH IN THE MORNING 90 tablet 1   methocarbamol (ROBAXIN) 500 MG tablet Take 1 tablet (500  mg total) by mouth every 8 (eight) hours as needed for muscle spasms. 40 tablet 1   nitroGLYCERIN (NITROSTAT) 0.4 MG SL tablet Place 1 tablet (0.4 mg total) under the tongue every 5 (five) minutes as needed for chest pain. DISSOLVE ONE TABLET UNDER THE TONGUE EVERY 5 MINUTES AS NEEDED FOR CHEST PAIN.  DO NOT EXCEED A TOTAL OF 3 DOSES IN 15 MINUTES 25 tablet 3   pantoprazole (PROTONIX) 40 MG tablet Take 40 mg by mouth daily as needed (GERD).     polyethylene glycol (MIRALAX / GLYCOLAX) 17 g packet Take 17 g by mouth daily. 14 each 0   silver sulfADIAZINE (SILVADENE) 1 % cream Apply 1 application topically daily. Apply to shoulder nightly 7-14 days 50 g 0   No current facility-administered medications for this visit.     PHYSICAL EXAMINATION: ECOG PERFORMANCE STATUS: 0 - Asymptomatic  Vitals:   07/21/21 1406  BP: (!) 151/70  Pulse: 76  Resp: 18  Temp: 97.7 F (36.5 C)  SpO2: 96%   Filed Weights   07/21/21 1406  Weight: 197 lb 9.6 oz (89.6 kg)    GENERAL:alert, no distress and comfortable SKIN: skin color, texture, turgor are normal, no rashes or significant lesions EYES: normal, conjunctiva are pink and non-injected, sclera clear Face: There is a clear asymmetry of the right versus the left side of the face.  I agree with the caving in of the right side of the face, there is a spontaneous ecchymosis of her right eyelid noted.  I did not feel any masses on the face or the parotid gland area. OROPHARYNX:no exudate, no erythema and lips, buccal mucosa, and tongue normal,examination of inside of mouth did not also reveal any  masses. NECK: Postradiation changes noted in the neck but no definitive palpable masses. LUNGS: clear to auscultation and percussion with normal breathing effort HEART: regular rate & rhythm and no murmurs Musculoskeletal:no cyanosis of digits and no clubbing  PSYCH: alert & oriented x 3 with fluent speech NEURO: no focal motor/sensory deficits  LABORATORY DATA:  I have reviewed the data as listed Lab Results  Component Value Date   WBC 4.1 06/24/2021   HGB 12.3 06/24/2021   HCT 36.7 06/24/2021   MCV 95 06/24/2021   PLT 169 06/24/2021     Chemistry      Component Value Date/Time   NA 142 06/24/2021 1049   K 4.3 06/24/2021 1049   CL 105 06/24/2021 1049   CO2 27 06/24/2021 1049   BUN 17 06/24/2021 1049   CREATININE 0.90 06/24/2021 1049      Component Value Date/Time   CALCIUM 9.0 06/24/2021 1049   ALKPHOS 64 06/24/2021 1049   AST 26 06/24/2021 1049   ALT 24 06/24/2021 1049   BILITOT 0.2 06/24/2021 1049       RADIOGRAPHIC STUDIES: I have personally reviewed the radiological images as listed and agreed with the findings in the report. No results found.  All questions were answered. The patient knows to call the clinic with any problems, questions or concerns. I spent 60 minutes in the care of this patient including H and P, review of records, counseling and coordination of care.     Benay Pike, MD 07/21/2021 2:24 PM

## 2021-07-22 ENCOUNTER — Encounter: Payer: Self-pay | Admitting: Hematology and Oncology

## 2021-08-04 ENCOUNTER — Telehealth: Payer: Self-pay | Admitting: Hematology and Oncology

## 2021-08-04 ENCOUNTER — Ambulatory Visit (HOSPITAL_COMMUNITY): Payer: BC Managed Care – PPO

## 2021-08-04 NOTE — Telephone Encounter (Signed)
Rescheduled appointment per provider. Patient aware.  

## 2021-08-08 ENCOUNTER — Ambulatory Visit (HOSPITAL_COMMUNITY)
Admission: RE | Admit: 2021-08-08 | Discharge: 2021-08-08 | Disposition: A | Discharge status: Home / Self Care | Payer: BC Managed Care – PPO | Source: Ambulatory Visit | Attending: Hematology and Oncology | Admitting: Hematology and Oncology

## 2021-08-08 ENCOUNTER — Other Ambulatory Visit: Payer: Self-pay

## 2021-08-08 DIAGNOSIS — C07 Malignant neoplasm of parotid gland: Secondary | ICD-10-CM | POA: Insufficient documentation

## 2021-08-08 DIAGNOSIS — Z87828 Personal history of other (healed) physical injury and trauma: Secondary | ICD-10-CM | POA: Diagnosis not present

## 2021-08-08 DIAGNOSIS — J341 Cyst and mucocele of nose and nasal sinus: Secondary | ICD-10-CM | POA: Diagnosis not present

## 2021-08-08 DIAGNOSIS — Z85818 Personal history of malignant neoplasm of other sites of lip, oral cavity, and pharynx: Secondary | ICD-10-CM | POA: Diagnosis not present

## 2021-08-08 DIAGNOSIS — R233 Spontaneous ecchymoses: Secondary | ICD-10-CM | POA: Diagnosis not present

## 2021-08-08 LAB — POCT I-STAT CREATININE: Creatinine, Ser: 0.8 mg/dL (ref 0.44–1.00)

## 2021-08-08 MED ORDER — IOHEXOL 300 MG/ML  SOLN
75.0000 mL | Freq: Once | INTRAMUSCULAR | Status: AC | PRN
  Administered 2021-08-08: 75 mL via INTRAVENOUS

## 2021-08-08 MED ORDER — SODIUM CHLORIDE (PF) 0.9 % IJ SOLN
INTRAMUSCULAR | Status: AC
  Filled 2021-08-08: qty 50

## 2021-08-16 ENCOUNTER — Other Ambulatory Visit: Payer: Self-pay

## 2021-08-16 ENCOUNTER — Inpatient Hospital Stay: Payer: BC Managed Care – PPO | Admitting: Hematology and Oncology

## 2021-08-16 ENCOUNTER — Encounter: Payer: Self-pay | Admitting: Hematology and Oncology

## 2021-08-16 VITALS — BP 163/73 | HR 91 | Temp 97.5°F | Resp 20 | Wt 197.4 lb

## 2021-08-16 DIAGNOSIS — R233 Spontaneous ecchymoses: Secondary | ICD-10-CM | POA: Diagnosis not present

## 2021-08-16 DIAGNOSIS — Z85820 Personal history of malignant melanoma of skin: Secondary | ICD-10-CM | POA: Diagnosis not present

## 2021-08-16 DIAGNOSIS — Z923 Personal history of irradiation: Secondary | ICD-10-CM | POA: Diagnosis not present

## 2021-08-16 DIAGNOSIS — C07 Malignant neoplasm of parotid gland: Secondary | ICD-10-CM

## 2021-08-16 DIAGNOSIS — Z85828 Personal history of other malignant neoplasm of skin: Secondary | ICD-10-CM | POA: Diagnosis not present

## 2021-08-16 DIAGNOSIS — R238 Other skin changes: Secondary | ICD-10-CM | POA: Diagnosis not present

## 2021-08-16 NOTE — Progress Notes (Signed)
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 Since her last visit, we have a CT soft tissue neck with contrast (this was recommended as the test of choice by neuroradiology to evaluate the area as well as the neck).  No concern for recurrence At this time, we dont have evidence for cancer recurrence. I have sent an in basket message to Dr. Tammi Klippel to see if this could be postradiation changes.  I recommended that patient go back to see ENT for follow-up and to discuss with Dr. Warrick Parisian for any further concerns. I do not see clear need for MRI brain at this time but this is certainly a discussion that she can have with Dr. Warrick Parisian to see if this is warranted. Return to medical oncology as needed.  HISTORY OF PRESENTING ILLNESS:  [name] 61 y.o. female is here because of history of malignant myoepithelioma of right parotid gland  2001, melanoma of right eyebrow, mohr's surgery in [place]. She didn't get any adjuvant therapy. She doesn't believe there is LN involvement. Dr Lacinda Axon was the surgeon. 2008, right parotid malignant myoepithelioma, treated with surgery and radiation by Dr Tammi Klippel. Dr Redmond Baseman is the  surgeon. She now has noted caving of the right face for the past 6 months, getting worse, asymmetry. She has also noticed spontaneous bruising of the right eyelid a couple days ago. NO speech changes, swallowing changes, pulling in the cheek feeling is present. No difficulty breathing. No changes in bowel habits or trouble urinating. She has history of kidney stones, no UTI, had previous procedures. History of Takotsubo cardiomyopathy, resolved.  She has been going to her dentist who has been examining her but did not feel like these changes were significant.  She last saw Dr. Redmond Baseman early 2021.  She has not seen Dr. Tammi Klippel in a few years.  Interval history She continues to complain of ongoing changes in the face, asymmetry, more prominent nasolabial fold on the left side and more caving of the right side.  Had a bruise on the right side has resolved since her last visit.  She tells me that she occasionally stumbles as well and was wondering if she needs to get an MRI brain.  Rest of the pertinent 10 point ROS reviewed and negative.  MEDICAL HISTORY:  Past Medical History:  Diagnosis Date   Anxiety    Arthritis    back (12/30/2014)   Asthma    as a child, no problems as an adult, has an albuterol inhaler   Cerebral cyst    per brain MRI 07-12-2019 unchanged 52mm cyst inferomedial right frontal lobe   Chronic headaches    Chronic systolic (congestive) heart failure ([place])    followed by dr Frances Nickels   Complication of anesthesia  pt is very sensitive to benzodiazepines, pt stated "almost died"   Compression fracture of lumbar vertebra ([place]) 05/27/2020   per pt L3 and L4   DCM (dilated cardiomyopathy) ([place])    w/ Takatsubo---  followed by dr Johnsie Cancel---  stress-induced --- cardiac cath 12-30-2014 ef 30-35%, recovered per cardiac MRI 03-15-2015 ef  64%   Depression    Disorder of mastoid    per brain MRI 07-12-2019 persistant large mastoid effusion   Dysuria    Environmental and seasonal  allergies    Frequency of urination    GERD (gastroesophageal reflux disease)    History of asthma    childhood   History of esophageal spasm    History of kidney stones    History of melanoma excision 2001   right supraorbital (right forehead and upper eyelid )  s/p  Moh's procedure w/ sln bx,  no recurrence per pt   History of non-ST elevation myocardial infarction (NSTEMI) 12/29/2014   per cath normal coronaries , ef 30-35%---  stress-- induced Takotsubo syndrome    History of palpitations 01/05/2015   STRESS INDUCED   History of parotid cancer 2008   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-2006 s/p  right lateral parotidectomy w/ nerve dissection , modified radical neck dissection ;  completed  x35 fractions raditation 2008;  no recurrence per pt   Hyperlipidemia    Hypertension    Kidney cysts    Left ureteral calculus    Low back pain    Melanoma ([place]) 2001   face   Muscle spasm of back    Myocardial infarction ([place]) 2016   Pneumonia    as a child   PONV (postoperative nausea and vomiting)    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   Squamous cell carcinoma of skin 02/22/2021   in [place]- right forehead ([id number])   Takotsubo syndrome 12/29/2014   cardiologist---  dr Frances Nickels--- dx NSTEMI -- stress-induced w/ DCM--  per cath 12-30-2014 normal coronaries , ef 30-35%,  recovered ef per cardiac MRI  ef 64%   Urgency of urination    Wears glasses     SURGICAL HISTORY: Past Surgical History:  Procedure Laterality Date   BACK SURGERY     BREAST BIOPSY Bilateral early 2000's   CARDIAC CATHETERIZATION  2006 (APPROX)  [place]   NORMAL   CARDIAC CATHETERIZATION  12/30/2014   CARDIAC CATHETERIZATION N/A 12/30/2014   Procedure: Left Heart Cath and Coronary Angiography;  Surgeon: Wellington Hampshire, MD;  Location: [facility];  Service: Cardiovascular;  Laterality: N/A;   CARDIOVASCULAR STRESS TEST  03-21-2012  DR Johnsie Cancel   NORMAL NUCLEAR  STUDY/  NO ISCHEMIA/  EF 63%   COLONOSCOPY  10/2020   COLONOSCOPY WITH PROPOFOL  05/29/2017   CYSTOSCOPY W/ URETERAL STENT PLACEMENT Left 03/26/2013   Procedure: CYSTOSCOPY WITH RETROGRADE PYELOGRAM ;  Surgeon: Molli Hazard, MD;  Location: [facility];  Service: Urology;  Laterality: Left;   CYSTOSCOPY WITH RETROGRADE PYELOGRAM, URETEROSCOPY AND STENT PLACEMENT Bilateral 03/19/2013   Procedure: CYSTOSCOPY WITH RETROGRADE PYELOGRAM, BILATERAL URETEROSCOPY AND STENT PLACEMENT LEFT URETER,BILATERAL STONE EXTRACTION , HOLMIUM LASER LEFT URETER;  Surgeon: Molli Hazard, MD;  Location: [facility];  Service: Urology;  Laterality: Bilateral;   CYSTOSCOPY WITH STENT PLACEMENT Left 03/26/2013   Procedure: CYSTOSCOPY WITH STENT PLACEMENT;  Surgeon: Molli Hazard, MD;  Location: [facility];  Service: Urology;  Laterality: Left;  CYSTOSCOPY/URETEROSCOPY/HOLMIUM LASER/STENT PLACEMENT Left 07/07/2020   Procedure: CYSTOSCOPY LEFT URETEROSCOPY/HOLMIUM LASER/STENT PLACEMENT;  Surgeon: Lucas Mallow, MD;  Location: [facility];  Service: Urology;  Laterality: Left;   DIAGNOSTIC LAPAROSCOPY  04/12/2009   ESOPHAGOGASTRODUODENOSCOPY (EGD) WITH PROPOFOL N/A 02/10/2016   Procedure: ESOPHAGOGASTRODUODENOSCOPY (EGD) WITH PROPOFOL;  Surgeon: Ronald Lobo, MD;  Location: [facility];  Service: Endoscopy;  Laterality: N/A;   KIDNEY SURGERY  1966   BILATERAL URETER'S DILATATION   LUMBAR LAMINECTOMY/DECOMPRESSION MICRODISCECTOMY  05/18/2011   Procedure: LUMBAR LAMINECTOMY/DECOMPRESSION MICRODISCECTOMY;  Surgeon: Johnn Hai;  Location: [facility];  Service: Orthopedics;  Laterality: Right;  Decompression Lumbar 4-Lumbar 5  Right    (xray)    LUMBAR LAMINECTOMY/DECOMPRESSION MICRODISCECTOMY N/A 05/06/2021   Procedure: Microlumbar decompression L5-S1;  Surgeon: Susa Day, MD;  Location: [facility];  Service: Orthopedics;  Laterality: N/A;  3 C-bed 90  mins   MELANOMA EXCISION WITH SENTINEL LYMPH NODE BIOPSY  2001   moh's procedure/  RIGHT FOREHEAD AND UPPER EYEBROW   RIGHT LATERAL PAROTIDECTOMY W/ NERVE DISSECTION / RIGHT MODIFIED RADICAL NECK DISSECTION SPARING SCM ELEVENTH NERVE AND INTERNAL JUGULAR VEIN  09-12-2006  DR DWIGHT BATES   DR DWIGHT BATES; "inside gland; lots of lymph nodes"    SOCIAL HISTORY: Social History   Socioeconomic History   Marital status: Married    Spouse name: Not on file   Number of children: Not on file   Years of education: Not on file   Highest education level: Not on file  Occupational History   Occupation: Scientist, water quality at [employer] Use   Smoking status: Never   Smokeless tobacco: Never  Vaping Use   Vaping Use: Never used  Substance and Sexual Activity   Alcohol use: Not Currently    Comment: occasional- 1 drink per month   Drug use: Never   Sexual activity: Not on file  Other Topics Concern   Not on file  Social History Narrative   Not on file   Social Determinants of Health   Financial Resource Strain: Not on file  Food Insecurity: Not on file  Transportation Needs: Not on file  Physical Activity: Not on file  Stress: Not on file  Social Connections: Not on file  Intimate Partner Violence: Not on file    FAMILY HISTORY: Family History  Problem Relation Age of Onset   Hypertension Mother    COPD Mother    Cancer Mother        breast   Dementia Mother    Heart disease Father    Cancer Father        Colorectal   Hyperlipidemia Father    Hypertension Father     ALLERGIES:  is allergic to cephalexin, lorazepam, and promethazine.  MEDICATIONS:  Current Outpatient Medications  Medication Sig Dispense Refill   acetaminophen (TYLENOL) 500 MG tablet Take 1,000 mg by mouth every 6 (six) hours as needed for moderate pain.     calcium carbonate (TUMS - DOSED IN MG ELEMENTAL CALCIUM) 500 MG chewable tablet Chew 1,000 mg by mouth daily as needed for indigestion or heartburn.      carvedilol (COREG) 3.125 MG tablet TAKE 1 TABLET BY MOUTH TWICE DAILY WITH A MEAL 180 tablet 1   docusate sodium (COLACE) 100 MG capsule Take 1 capsule (100 mg total) by mouth 2 (two) times daily as needed for mild constipation. 30 capsule 1   lidocaine (LIDODERM) 5 % Place 1 patch onto the skin daily as needed (pain).  lisinopril (ZESTRIL) 2.5 MG tablet TAKE 1 TABLET BY MOUTH IN THE MORNING 90 tablet 1   methocarbamol (ROBAXIN) 500 MG tablet Take 1 tablet (500 mg total) by mouth every 8 (eight) hours as needed for muscle spasms. 40 tablet 1   nitroGLYCERIN (NITROSTAT) 0.4 MG SL tablet Place 1 tablet (0.4 mg total) under the tongue every 5 (five) minutes as needed for chest pain. DISSOLVE ONE TABLET UNDER THE TONGUE EVERY 5 MINUTES AS NEEDED FOR CHEST PAIN.  DO NOT EXCEED A TOTAL OF 3 DOSES IN 15 MINUTES 25 tablet 3   pantoprazole (PROTONIX) 40 MG tablet Take 40 mg by mouth daily as needed (GERD).     polyethylene glycol (MIRALAX / GLYCOLAX) 17 g packet Take 17 g by mouth daily. 14 each 0   silver sulfADIAZINE (SILVADENE) 1 % cream Apply 1 application topically daily. Apply to shoulder nightly 7-14 days 50 g 0   No current facility-administered medications for this visit.     PHYSICAL EXAMINATION: ECOG PERFORMANCE STATUS: 0 - Asymptomatic  Vitals:   08/16/21 0905  BP: (!) 163/73  Pulse: 91  Resp: 20  Temp: (!) 97.5 F (36.4 C)  SpO2: 99%   Filed Weights   08/16/21 0905  Weight: 197 lb 6.4 oz (89.5 kg)   Physical exam deferred in lieu of counseling.   I do not appreciate any significant difference in the face compared to her last exam on inspection.  LABORATORY DATA:  I have reviewed the data as listed Lab Results  Component Value Date   WBC 4.1 06/24/2021   HGB 12.3 06/24/2021   HCT 36.7 06/24/2021   MCV 95 06/24/2021   PLT 169 06/24/2021     Chemistry      Component Value Date/Time   NA 142 06/24/2021 1049   K 4.3 06/24/2021 1049   CL 105 06/24/2021 1049   CO2  27 06/24/2021 1049   BUN 17 06/24/2021 1049   CREATININE 0.80 08/08/2021 1437      Component Value Date/Time   CALCIUM 9.0 06/24/2021 1049   ALKPHOS 64 06/24/2021 1049   AST 26 06/24/2021 1049   ALT 24 06/24/2021 1049   BILITOT 0.2 06/24/2021 1049       RADIOGRAPHIC STUDIES: I have personally reviewed the radiological images as listed and agreed with the findings in the report. CT Soft Tissue Neck W Contrast  Result Date: 08/09/2021 CLINICAL DATA:  History of malignant myoepoithelioma of right parotid gland, now with changes in right face, cava of face, spontaneous ecchymosis of right eye. EXAM: CT NECK WITH CONTRAST TECHNIQUE: Multidetector CT imaging of the neck was performed using the standard protocol following the bolus administration of intravenous contrast. RADIATION DOSE REDUCTION: This exam was performed according to the departmental dose-optimization program which includes automated exposure control, adjustment of the mA and/or kV according to patient size and/or use of iterative reconstruction technique. CONTRAST:  22mL OMNIPAQUE IOHEXOL 300 MG/ML  SOLN COMPARISON:  Neck CT 10/17/2014. FINDINGS: Pharynx and larynx: Streak and beam hardening artifact arising from dental restoration partially obscures the oral cavity. No appreciable swelling or discrete mass within the oral cavity, pharynx or larynx. Salivary glands: Redemonstrated sequela of prior right parotid and submandibular gland resection, and presumed right neck is dissection (with unchanged scarring at these sites).Unremarkable appearance of the left parotid and submandibular glands. Thyroid: Atrophy of the right lobe. Otherwise unremarkable. Lymph nodes: No pathologically enlarged cervical chain lymph nodes identified. Vascular: The major vascular structures of the neck  are patent. Atherosclerotic plaque within the visualized aortic arch, proximal major branch vessels of the neck and carotid arteries. Limited intracranial: No  appreciable acute intracranial abnormality within the field of view. Visualized orbits: Incompletely imaged. No orbital mass or acute orbital finding at the imaged levels. Mastoids and visualized paranasal sinuses: Small mucous retention cyst within the left maxillary sinus. Chronic right mastoid effusion. Skeleton: Straightening of the expected cervical lordosis. Cervical spondylosis. No acute bony abnormality or aggressive osseous lesion. Upper chest: No consolidation within the imaged lung apices. IMPRESSION: Redemonstrated sequela of prior right parotid and submandibular gland resection, and presumed right neck dissection. No evidence of residual/recurrent tumor. No pathologically enlarged cervical chain lymph nodes. Small left maxillary sinus mucous retention cyst. Chronic right mastoid effusion. Cervical spondylosis. Aortic Atherosclerosis (ICD10-I70.0). Electronically Signed   By: Kellie Simmering D.O.   On: 08/09/2021 14:33    All questions were answered. The patient knows to call the clinic with any problems, questions or concerns. I spent 20 minutes in the care of this patient including H and P, review of records, counseling and coordination of care.     Benay Pike, MD 08/16/2021 9:41 AM

## 2021-08-18 ENCOUNTER — Inpatient Hospital Stay: Payer: BC Managed Care – PPO | Admitting: Hematology and Oncology

## 2021-08-18 ENCOUNTER — Ambulatory Visit: Payer: BC Managed Care – PPO | Admitting: Hematology and Oncology

## 2021-08-19 ENCOUNTER — Telehealth: Payer: Self-pay | Admitting: *Deleted

## 2021-08-19 NOTE — Telephone Encounter (Addendum)
This RN called both home and cell number- informed pt at work and would not be reachable at this time. Message given per MD with this RN's name and return call number. ? ?----- Message from Benay Pike, MD sent at 08/17/2021  7:10 AM EST ----- ?Val, can you talk to her and see if she wants a referral to Dr Marla Roe, plastic surgery. ?----- Message ----- ?From: Tyler Pita, MD ?Sent: 08/16/2021   5:22 PM EST ?To: Benay Pike, MD ? ?Thanks Praveena, ?I reviewed the patient's scan.  She had removal of the right parotid gland and submandibular gland.  This loss of the gland tissue, does produce some facial asymmetry, which is not immediately as apparent in the years after surgery and radiation due to some post-op swelling and lymphedema.  As these post-treatment changes have resolved.  I think the caving and asymmetry reflect the absence of the glands.  Fortunately, there does not appear to be any recurrence.  If she is concerned about the cosmetic appearance, there are some references about post-parotidectomy cosmetic options with facial sculpting using fillers like voluma, radiesse, lyft, juvederm and sculptra and fat.  I wonder if a referral to Audelia Hives would be beneficial? ?I don't think she would get a lot out of seeing me.  I'll e-mail Lyndee Leo to figure out if the are any options to repair? ?Matt ? ?----- Message ----- ?From: Benay Pike, MD ?Sent: 08/16/2021   9:43 AM EST ?To: Tyler Pita, MD ? ?Dr Tammi Klippel ? ?I am seeing Ms Sprung who had myoepitheloid tumor of the right parotid gland s.p surgery and adjuvant radiation. She is referred to Medical oncology because of some changes in her face such as increased caving and asymmetry. ?I couldn't appreciate any physical exam abnormalities, CT neck ( radiology recommended this to look at neck and face) with no recurrence. ?I wonder if you can evaluate the patient for further guidance, if this is just post radiation changes vs something  else. ? ?Thanks, ? ? ? ?

## 2021-08-23 ENCOUNTER — Telehealth: Payer: Self-pay | Admitting: *Deleted

## 2021-08-23 NOTE — Telephone Encounter (Signed)
This RN spoke with pt per her call regarding prior call made by this RN inquiring if she would like to see a plastic surgeon for noted changes in facial asymmetry s/p remote head and neck cancer. ? ?[name] states she is not so concerned about " how I look - just more concerned as to why this is now happening and if the cancer was recurring ?" ?" The doctor told me I was fine from a cancer point of view so what would be causing this to be more notable now?" ? ?This RN discussed aspect of remote surgeries and or injuries that change as we age. ? ?Function wise she has a dryer mouth then previously but able to swallow and speak without any issues. ? ?This RN reviewed with her possible benefit with use of sour lozenges ( she uses cough drops ) and frequent swishing with coconut oil. ? ?She did ask what benefit would the plastic surgeon offer besides cosmetics- this RN informed her that would need to be addressed by the MD. ? ?[name] states she appreciated discussion and she will think about proceeding to a Psychiatric nurse and let us know if she wants to proceed. ?

## 2021-08-29 DIAGNOSIS — Z029 Encounter for administrative examinations, unspecified: Secondary | ICD-10-CM

## 2021-08-30 DIAGNOSIS — Z8719 Personal history of other diseases of the digestive system: Secondary | ICD-10-CM | POA: Diagnosis not present

## 2021-08-30 DIAGNOSIS — Z9889 Other specified postprocedural states: Secondary | ICD-10-CM | POA: Diagnosis not present

## 2021-08-30 DIAGNOSIS — H6121 Impacted cerumen, right ear: Secondary | ICD-10-CM | POA: Diagnosis not present

## 2021-09-26 ENCOUNTER — Telehealth: Payer: Self-pay | Admitting: Cardiovascular Disease

## 2021-09-27 ENCOUNTER — Other Ambulatory Visit: Payer: Self-pay | Admitting: *Deleted

## 2021-09-27 MED ORDER — ATORVASTATIN CALCIUM 40 MG PO TABS: 40.0000 mg | ORAL_TABLET | Freq: Every day | ORAL | 3 refills | Status: DC

## 2021-09-27 NOTE — Telephone Encounter (Signed)
Pt c/o medication issue: ? ?1. Name of Medication: atorvastatin (LIPITOR) 40 MG tablet [[id number]]  ? ?2. How are you currently taking this medication (dosage and times per day)? 40 mg daily  ? ?3. Are you having a reaction (difficulty breathing--STAT)? No  ? ?4. What is your medication issue? Pt husband called and would like to know why this refill was denied?  She is out of this med  ? ?Best number  [phone]  ?

## 2021-09-27 NOTE — Telephone Encounter (Signed)
S/w pt's husband per ( DPR). Reason for recent denial pt's oncologist has been filling medication. Pt hs been off this medication for 1 month due to recent back surgery per spouse.  Spouse would like to know if pt can start back and if so # 90 day supply to [facility].  Will send to Dr. Johnsie Cancel to advise.  ?

## 2021-09-27 NOTE — Telephone Encounter (Signed)
S/w spouse per (DPR) Dr. Johnsie Cancel stated was ok to fill Atorvastatin. ?

## 2021-11-01 ENCOUNTER — Ambulatory Visit: Payer: BC Managed Care – PPO | Admitting: Dermatology

## 2021-11-01 ENCOUNTER — Encounter: Payer: Self-pay | Admitting: Dermatology

## 2021-11-01 DIAGNOSIS — L851 Acquired keratosis [keratoderma] palmaris et plantaris: Secondary | ICD-10-CM

## 2021-11-01 DIAGNOSIS — L219 Seborrheic dermatitis, unspecified: Secondary | ICD-10-CM

## 2021-11-01 DIAGNOSIS — L570 Actinic keratosis: Secondary | ICD-10-CM

## 2021-11-01 DIAGNOSIS — L821 Other seborrheic keratosis: Secondary | ICD-10-CM

## 2021-11-10 DIAGNOSIS — K589 Irritable bowel syndrome without diarrhea: Secondary | ICD-10-CM | POA: Diagnosis not present

## 2021-11-10 DIAGNOSIS — I255 Ischemic cardiomyopathy: Secondary | ICD-10-CM | POA: Diagnosis not present

## 2021-11-10 DIAGNOSIS — E785 Hyperlipidemia, unspecified: Secondary | ICD-10-CM | POA: Diagnosis not present

## 2021-11-10 DIAGNOSIS — I700 Atherosclerosis of aorta: Secondary | ICD-10-CM | POA: Diagnosis not present

## 2021-11-10 DIAGNOSIS — I10 Essential (primary) hypertension: Secondary | ICD-10-CM | POA: Diagnosis not present

## 2021-11-10 DIAGNOSIS — R1013 Epigastric pain: Secondary | ICD-10-CM | POA: Diagnosis not present

## 2021-11-10 DIAGNOSIS — I252 Old myocardial infarction: Secondary | ICD-10-CM | POA: Diagnosis not present

## 2021-11-10 DIAGNOSIS — K219 Gastro-esophageal reflux disease without esophagitis: Secondary | ICD-10-CM | POA: Diagnosis not present

## 2021-11-10 DIAGNOSIS — M79671 Pain in right foot: Secondary | ICD-10-CM | POA: Diagnosis not present

## 2021-11-10 DIAGNOSIS — R1031 Right lower quadrant pain: Secondary | ICD-10-CM | POA: Diagnosis not present

## 2021-11-10 DIAGNOSIS — R079 Chest pain, unspecified: Secondary | ICD-10-CM | POA: Diagnosis not present

## 2021-11-10 DIAGNOSIS — R519 Headache, unspecified: Secondary | ICD-10-CM | POA: Diagnosis not present

## 2021-11-10 DIAGNOSIS — I2510 Atherosclerotic heart disease of native coronary artery without angina pectoris: Secondary | ICD-10-CM | POA: Insufficient documentation

## 2021-11-10 DIAGNOSIS — R0789 Other chest pain: Secondary | ICD-10-CM | POA: Diagnosis not present

## 2021-11-10 DIAGNOSIS — G8929 Other chronic pain: Secondary | ICD-10-CM | POA: Diagnosis not present

## 2021-11-10 DIAGNOSIS — R109 Unspecified abdominal pain: Secondary | ICD-10-CM | POA: Diagnosis not present

## 2021-11-16 ENCOUNTER — Encounter: Payer: Self-pay | Admitting: Family Medicine

## 2021-11-16 ENCOUNTER — Ambulatory Visit: Payer: BC Managed Care – PPO | Admitting: Family Medicine

## 2021-11-16 VITALS — BP 138/77 | HR 80 | Temp 98.3°F | Ht 64.0 in | Wt 201.0 lb

## 2021-11-16 DIAGNOSIS — I951 Orthostatic hypotension: Secondary | ICD-10-CM | POA: Diagnosis not present

## 2021-11-16 DIAGNOSIS — I10 Essential (primary) hypertension: Secondary | ICD-10-CM | POA: Diagnosis not present

## 2021-11-16 DIAGNOSIS — K5900 Constipation, unspecified: Secondary | ICD-10-CM | POA: Diagnosis not present

## 2021-11-16 MED ORDER — POLYETHYLENE GLYCOL 3350 17 G PO PACK: 17.0000 g | PACK | Freq: Every day | ORAL | 1 refills | Status: DC

## 2021-11-16 NOTE — Progress Notes (Signed)
BP 138/77   Pulse 80   Temp 98.3 F (36.8 C)   Ht '5\' 4"'$  (1.626 m)   Wt 201 lb (91.2 kg)   LMP 05/18/2011   SpO2 96%   BMI 34.50 kg/m    Subjective:   Patient ID: [name], female    DOB: 1961/03/01, 61 y.o.   MRN: [id number]  HPI: [name] is a 61 y.o. female presenting on 11/16/2021 for Hypertension and Dizziness   HPI Patient is coming in today with complaints of an episode that happened about 3 days ago where she had some sharp left upper quadrant abdominal pain and then some pain radiating down her left arm and then she was a little bit nauseous with it and she felt lightheaded and dizzy and felt flushed.  She was in the pharmacy at the time and they checked her blood pressure and it was 190/130 and then it came down to 160/120 and she was taking a couple nitro and did not get improvement with that and so they did send her to the emergency department.  She was evaluated in the emergency department for cardiac and did a CT scan of the abdomen and pelvis and also did a CT scan of her head because of headaches and dizziness.  Since this day she has not had this seen episode again but she still does have some left upper quadrant abdominal soreness and tenderness just under the ribs.  She denies any nausea or vomiting.  She does say she uses stool softener every day and is able to move bowel movements every day but they are hard little pebbles.  She denies any nausea vomiting or diarrhea today.  Denies any blood in her stool.  She did have a colonoscopy last year.  Relevant past medical, surgical, family and social history reviewed and updated as indicated. Interim medical history since our last visit reviewed. Allergies and medications reviewed and updated.  Review of Systems  Constitutional:  Negative for chills and fever.  Eyes:  Negative for visual disturbance.  Respiratory:  Negative for chest tightness and shortness of breath.   Cardiovascular:  Negative for chest  pain and leg swelling.  Gastrointestinal:  Positive for constipation. Negative for blood in stool, diarrhea and vomiting.  Genitourinary:  Negative for difficulty urinating and dysuria.  Musculoskeletal:  Negative for back pain and gait problem.  Skin:  Negative for rash.  Neurological:  Negative for light-headedness and headaches.  Psychiatric/Behavioral:  Negative for agitation and behavioral problems.   All other systems reviewed and are negative.  Per HPI unless specifically indicated above   Allergies as of 11/16/2021       Reactions   Cephalexin Shortness Of Breath, Rash   Lorazepam Shortness Of Breath, Other (See Comments)   She may be very sensitive to benzo.    Stops breathing    Promethazine    Felt strange, "knocked me out"        Medication List        Accurate as of Nov 16, 2021 10:03 AM. If you have any questions, ask your nurse or doctor.          acetaminophen 500 MG tablet Commonly known as: TYLENOL Take 1,000 mg by mouth every 6 (six) hours as needed for moderate pain.   atorvastatin 40 MG tablet Commonly known as: LIPITOR Take 1 tablet (40 mg total) by mouth daily.   calcium carbonate 500 MG chewable tablet Commonly known as: TUMS -  dosed in mg elemental calcium Chew 1,000 mg by mouth daily as needed for indigestion or heartburn.   carvedilol 3.125 MG tablet Commonly known as: COREG TAKE 1 TABLET BY MOUTH TWICE DAILY WITH A MEAL   cetirizine 10 MG tablet Commonly known as: ZYRTEC Take by mouth.   docusate sodium 100 MG capsule Commonly known as: Colace Take 1 capsule (100 mg total) by mouth 2 (two) times daily as needed for mild constipation.   fexofenadine-pseudoephedrine 180-240 MG 24 hr tablet Commonly known as: ALLEGRA-D 24 Take 1 tablet by mouth as needed.   lidocaine 5 % Commonly known as: LIDODERM Place 1 patch onto the skin daily as needed (pain).   lisinopril 2.5 MG tablet Commonly known as: ZESTRIL TAKE 1 TABLET BY MOUTH  IN THE MORNING   loratadine-pseudoephedrine 10-240 MG 24 hr tablet Commonly known as: CLARITIN-D 24-hour Take 1 tablet by mouth as needed for allergies.   nitroGLYCERIN 0.4 MG SL tablet Commonly known as: NITROSTAT Place 1 tablet (0.4 mg total) under the tongue every 5 (five) minutes as needed for chest pain. DISSOLVE ONE TABLET UNDER THE TONGUE EVERY 5 MINUTES AS NEEDED FOR CHEST PAIN.  DO NOT EXCEED A TOTAL OF 3 DOSES IN 15 MINUTES   pantoprazole 40 MG tablet Commonly known as: PROTONIX Take 40 mg by mouth daily as needed (GERD).   polyethylene glycol 17 g packet Commonly known as: MIRALAX / GLYCOLAX Take 17 g by mouth daily.   silver sulfADIAZINE 1 % cream Commonly known as: Silvadene Apply 1 application topically daily. Apply to shoulder nightly 7-14 days         Objective:   BP 138/77   Pulse 80   Temp 98.3 F (36.8 C)   Ht '5\' 4"'$  (1.626 m)   Wt 201 lb (91.2 kg)   LMP 05/18/2011   SpO2 96%   BMI 34.50 kg/m   Wt Readings from Last 3 Encounters:  11/16/21 201 lb (91.2 kg)  08/16/21 197 lb 6.4 oz (89.5 kg)  07/21/21 197 lb 9.6 oz (89.6 kg)    Physical Exam Vitals and nursing note reviewed.  Constitutional:      General: She is not in acute distress.    Appearance: She is well-developed. She is not diaphoretic.  Eyes:     Conjunctiva/sclera: Conjunctivae normal.  Cardiovascular:     Rate and Rhythm: Normal rate and regular rhythm.     Heart sounds: Normal heart sounds. No murmur heard. Pulmonary:     Effort: Pulmonary effort is normal. No respiratory distress.     Breath sounds: Normal breath sounds. No wheezing.  Abdominal:     General: Abdomen is flat. Bowel sounds are normal. There is no distension.     Palpations: Abdomen is soft.     Tenderness: There is abdominal tenderness in the left upper quadrant. There is no right CVA tenderness, left CVA tenderness, guarding or rebound. Negative signs include Murphy's sign and McBurney's sign.  Musculoskeletal:         General: No swelling or tenderness. Normal range of motion.  Skin:    General: Skin is warm and dry.     Findings: No rash.  Neurological:     Mental Status: She is alert and oriented to person, place, and time.     Coordination: Coordination normal.  Psychiatric:        Behavior: Behavior normal.      Assessment & Plan:   Problem List Items Addressed This Visit  Cardiovascular and Mediastinum   Essential (primary) hypertension - Primary (Chronic)   Orthostatic hypotension   Other Visit Diagnoses     Constipation, unspecified constipation type       Relevant Medications   polyethylene glycol (MIRALAX / GLYCOLAX) 17 g packet       We will try and get the patient to take MiraLAX every day to have more regular bowel movements on top of the Colace and see if that reduces the abdominal pain. Follow up plan: Return if symptoms worsen or fail to improve.  Counseling provided for all of the vaccine components No orders of the defined types were placed in this encounter.   Caryl Pina, MD [facility] 11/16/2021, 10:03 AM

## 2021-11-24 ENCOUNTER — Encounter: Payer: Self-pay | Admitting: Dermatology

## 2021-11-24 NOTE — Progress Notes (Signed)
   Follow-Up Visit   Subjective  [name] is a 61 y.o. female who presents for the following: Skin Problem (Patient states that she has a few lesions that she would like checked. Per patient she has a skin tag on her neck that she would like removed. Check lesion on left forearm x few weeks no bleeding, no pain. Check lesion on right hand x few months that's itching and raised, no bleeding and no pain. Check three raised lesions on the left side of face x several months no bleeding no pain. Per patient she has redness and swelling on the left side of her nose x 1 month no injury no bleeding.).  Skin check, several areas of concern to patient Location:  Duration:  Quality:  Associated Signs/Symptoms: Modifying Factors:  Severity:  Timing: Context:   Objective  Well appearing patient in no apparent distress; mood and affect are within normal limits. Right Dorsal Hand Tan flattopped textured 6 mm papule with compatible dermoscopy  Left Forearm - Posterior Flat tan 5 mm symmetric papule  Left Forehead, Left Zygomatic Area Pink gritty to hornlike 3 mm crust  Left Nasal Sidewall Pink dermatitis with fine scale.  The asymmetry is a bit atypical for seborrheic dermatitis (no involvement scalp or ears).  Trial with over-the-counter hydrocortisone is reasonable.    All skin waist up examined.  Areas beneath undergarments not fully examined.   Assessment & Plan    Seborrheic keratosis Right Dorsal Hand  Leave if stable  Stucco keratosis Left Forearm - Posterior  No intervention necessary  AK (actinic keratosis) (2) Left Zygomatic Area; Left Forehead  Destruction of lesion - Left Forehead, Left Zygomatic Area Complexity: simple   Destruction method: cryotherapy   Informed consent: discussed and consent obtained   Timeout:  patient name, date of birth, surgical site, and procedure verified Lesion destroyed using liquid nitrogen: Yes   Cryotherapy cycles:   4 Outcome: patient tolerated procedure well with no complications   Post-procedure details: wound care instructions given    Seborrheic dermatitis Left Nasal Sidewall  Get OTC 1% Hydrocortisone Ointment apply nightly for 2 to 4 weeks and contact me if there is no improvement      I, Lavonna Monarch, MD, have reviewed all documentation for this visit.  The documentation on 11/24/21 for the exam, diagnosis, procedures, and orders are all accurate and complete.

## 2021-12-15 ENCOUNTER — Encounter: Payer: Self-pay | Admitting: Family

## 2021-12-15 ENCOUNTER — Ambulatory Visit: Payer: BC Managed Care – PPO | Admitting: Family

## 2021-12-15 VITALS — BP 107/68 | HR 91 | Temp 97.3°F | Ht 64.0 in | Wt 195.0 lb

## 2021-12-15 DIAGNOSIS — M722 Plantar fascial fibromatosis: Secondary | ICD-10-CM | POA: Diagnosis not present

## 2021-12-15 DIAGNOSIS — M79671 Pain in right foot: Secondary | ICD-10-CM | POA: Diagnosis not present

## 2021-12-15 DIAGNOSIS — J019 Acute sinusitis, unspecified: Secondary | ICD-10-CM

## 2021-12-15 MED ORDER — DOXYCYCLINE HYCLATE 100 MG PO TABS: 100.0000 mg | ORAL_TABLET | Freq: Two times a day (BID) | ORAL | 0 refills | Status: DC

## 2021-12-15 NOTE — Patient Instructions (Signed)

## 2021-12-15 NOTE — Progress Notes (Signed)
Subjective:    Patient ID: [name], female    DOB: Jun 18, 1961, 61 y.o.   MRN: [id number]  Chief Complaint  Patient presents with   Cough   Nasal Congestion   Sinusitis   Pt presents to the office today for sinus problems. 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  Cough Associated symptoms include chills, ear pain (right), headaches and a sore throat.  Sinusitis This is a new problem. The current episode started 1 to 4 weeks ago. The problem has been gradually worsening since onset. There has been no fever. Her pain is at a severity of 6/10. The pain is mild. Associated symptoms include chills, congestion, coughing, ear pain (right), headaches, a hoarse voice, sinus pressure, sneezing and a sore throat. Past treatments include oral decongestants and nasal decongestants. The treatment provided mild relief.      Review of Systems  Constitutional:  Positive for chills.  HENT:  Positive for congestion, ear pain (right), hoarse voice, sinus pressure, sneezing and sore throat.   Respiratory:  Positive for cough.   Neurological:  Positive for headaches.  All other systems reviewed and are negative.      Objective:   Physical Exam Vitals reviewed.  Constitutional:      General: She is not in acute distress.    Appearance: She is well-developed.  HENT:     Head: Normocephalic and atraumatic.     Right Ear: Tympanic membrane normal.     Left Ear: Tympanic membrane normal.     Nose:     Right Sinus: Maxillary sinus tenderness present.     Left Sinus: Maxillary sinus tenderness present.  Eyes:     Pupils: Pupils are equal, round, and reactive to light.  Neck:     Thyroid: No thyromegaly.  Cardiovascular:     Rate and Rhythm: Normal rate and regular rhythm.     Heart sounds: Normal heart sounds. No murmur heard. Pulmonary:     Effort: Pulmonary effort is normal. No respiratory distress.     Breath sounds: Normal breath sounds. No wheezing.   Abdominal:     General: Bowel sounds are normal. There is no distension.     Palpations: Abdomen is soft.     Tenderness: There is no abdominal tenderness.  Musculoskeletal:        General: No tenderness. Normal range of motion.     Cervical back: Normal range of motion and neck supple.  Skin:    General: Skin is warm and dry.  Neurological:     Mental Status: She is alert and oriented to person, place, and time.     Cranial Nerves: No cranial nerve deficit.     Deep Tendon Reflexes: Reflexes are normal and symmetric.  Psychiatric:        Behavior: Behavior normal.        Thought Content: Thought content normal.        Judgment: Judgment normal.      BP 107/68   Pulse 91   Temp (!) 97.3 F (36.3 C)   Ht '5\' 4"'$  (1.626 m)   Wt 195 lb (88.5 kg)   LMP 05/18/2011   SpO2 96%   BMI 33.47 kg/m       Assessment & Plan:  [name] comes in today with chief complaint of Cough, Nasal Congestion, and Sinusitis   Diagnosis and orders addressed:  1. Acute sinusitis, recurrence not specified, unspecified location - Take meds  as prescribed - Use a cool mist humidifier  -Use saline nose sprays frequently -Force fluids -For any cough or congestion  Use plain Mucinex- regular strength or max strength is fine -For fever or aces or pains- take tylenol or ibuprofen. -Throat lozenges if help -New toothbrush in 3 days Follow up if symptoms worsen or do not improve  - doxycycline (VIBRA-TABS) 100 MG tablet; Take 1 tablet (100 mg total) by mouth 2 (two) times daily.  Dispense: 20 tablet; Refill: 0   Evelina Dun, FNP

## 2021-12-21 ENCOUNTER — Other Ambulatory Visit: Payer: Self-pay | Admitting: Cardiovascular Disease

## 2021-12-29 ENCOUNTER — Telehealth: Payer: Self-pay | Admitting: Family Medicine

## 2021-12-29 ENCOUNTER — Encounter: Payer: Self-pay | Admitting: Family Medicine

## 2021-12-29 ENCOUNTER — Ambulatory Visit: Payer: BC Managed Care – PPO | Admitting: Family Medicine

## 2021-12-29 VITALS — BP 129/74 | HR 74 | Temp 97.8°F | Resp 20 | Ht 64.0 in | Wt 197.0 lb

## 2021-12-29 DIAGNOSIS — J329 Chronic sinusitis, unspecified: Secondary | ICD-10-CM | POA: Diagnosis not present

## 2021-12-29 DIAGNOSIS — Z8589 Personal history of malignant neoplasm of other organs and systems: Secondary | ICD-10-CM | POA: Diagnosis not present

## 2021-12-29 DIAGNOSIS — J3489 Other specified disorders of nose and nasal sinuses: Secondary | ICD-10-CM | POA: Diagnosis not present

## 2021-12-29 MED ORDER — METHYLPREDNISOLONE ACETATE 80 MG/ML IJ SUSP
80.0000 mg | Freq: Once | INTRAMUSCULAR | Status: AC
  Administered 2021-12-29: 80 mg via INTRAMUSCULAR

## 2021-12-29 MED ORDER — PREDNISONE 20 MG PO TABS: ORAL_TABLET | ORAL | 0 refills | Status: DC

## 2021-12-29 NOTE — Progress Notes (Signed)
BP 129/74   Pulse 74   Temp 97.8 F (36.6 C) (Temporal)   Resp 20   Ht '5\' 4"'$  (1.626 m)   Wt 197 lb (89.4 kg)   LMP 05/18/2011   SpO2 98%   BMI 33.81 kg/m    Subjective:   Patient ID: [name], female    DOB: 1960/12/17, 61 y.o.   MRN: [id number]  HPI: [name] is a 61 y.o. female presenting on 12/29/2021 for Sinus Problem (Still no better) and Ear Pain (Right ear/)   HPI Sinus pressure and ear pain Patient is coming in for sinus issues and pressure.  She came in and was treated 12/15/2021 with doxycycline and has been taking Mucinex and Flonase and your allergy medicine and that has not seemed to improve and she still fighting a lot of issues with it.  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  Relevant past medical, surgical, family and social history reviewed and updated as indicated. Interim medical history since our last visit reviewed. Allergies and medications reviewed and updated.  Review of Systems  Constitutional:  Negative for chills and fever.  HENT:  Positive for congestion, ear pain, sinus pressure and sinus pain.   Eyes:  Negative for visual disturbance.  Respiratory:  Negative for chest tightness and shortness of breath.   Cardiovascular:  Negative for chest pain and leg swelling.  Musculoskeletal:  Negative for back pain and gait problem.  Skin:  Negative for rash.  Neurological:  Negative for light-headedness and headaches.  Psychiatric/Behavioral:  Negative for agitation and behavioral problems.   All other systems reviewed and are negative.   Per HPI unless specifically indicated above   Allergies as of 12/29/2021       Reactions   Cephalexin Shortness Of Breath, Rash   Lorazepam Shortness Of Breath, Other (See Comments)   She may be very sensitive to benzo.    Stops breathing    Promethazine    Felt strange, "knocked me out"        Medication List        Accurate as of December 29, 2021  2:21 PM. If  you have any questions, ask your nurse or doctor.          STOP taking these medications    doxycycline 100 MG tablet Commonly known as: VIBRA-TABS Stopped by: Fransisca Kaufmann Sierrah Luevano, MD   pantoprazole 40 MG tablet Commonly known as: PROTONIX Stopped by: Worthy Rancher, MD       TAKE these medications    acetaminophen 500 MG tablet Commonly known as: TYLENOL Take 1,000 mg by mouth every 6 (six) hours as needed for moderate pain.   atorvastatin 40 MG tablet Commonly known as: LIPITOR Take 1 tablet (40 mg total) by mouth daily.   calcium carbonate 500 MG chewable tablet Commonly known as: TUMS - dosed in mg elemental calcium Chew 1,000 mg by mouth daily as needed for indigestion or heartburn.   carvedilol 3.125 MG tablet Commonly known as: COREG TAKE 1 TABLET BY MOUTH TWICE DAILY WITH A MEAL   cetirizine 10 MG tablet Commonly known as: ZYRTEC Take by mouth.   docusate sodium 100 MG capsule Commonly known as: Colace Take 1 capsule (100 mg total) by mouth 2 (two) times daily as needed for mild constipation.   famotidine 40 MG tablet Commonly known as: PEPCID Take 40 mg by mouth daily.   fexofenadine-pseudoephedrine 180-240 MG 24 hr tablet Commonly  known as: ALLEGRA-D 24 Take 1 tablet by mouth as needed.   lidocaine 5 % Commonly known as: LIDODERM Place 1 patch onto the skin daily as needed (pain).   lisinopril 2.5 MG tablet Commonly known as: ZESTRIL TAKE 1 TABLET BY MOUTH IN THE MORNING   loratadine-pseudoephedrine 10-240 MG 24 hr tablet Commonly known as: CLARITIN-D 24-hour Take 1 tablet by mouth as needed for allergies.   nitroGLYCERIN 0.4 MG SL tablet Commonly known as: NITROSTAT Place 1 tablet (0.4 mg total) under the tongue every 5 (five) minutes as needed for chest pain. DISSOLVE ONE TABLET UNDER THE TONGUE EVERY 5 MINUTES AS NEEDED FOR CHEST PAIN.  DO NOT EXCEED A TOTAL OF 3 DOSES IN 15 MINUTES   polyethylene glycol 17 g packet Commonly known  as: MIRALAX / GLYCOLAX Take 17 g by mouth daily.   predniSONE 20 MG tablet Commonly known as: DELTASONE Take 3 tabs daily for 1 week, then 2 tabs daily for week 2, then 1 tab daily for week 3. Started by: Fransisca Kaufmann Ashana Tullo, MD   silver sulfADIAZINE 1 % cream Commonly known as: Silvadene Apply 1 application topically daily. Apply to shoulder nightly 7-14 days         Objective:   BP 129/74   Pulse 74   Temp 97.8 F (36.6 C) (Temporal)   Resp 20   Ht '5\' 4"'$  (1.626 m)   Wt 197 lb (89.4 kg)   LMP 05/18/2011   SpO2 98%   BMI 33.81 kg/m   Wt Readings from Last 3 Encounters:  12/29/21 197 lb (89.4 kg)  12/15/21 195 lb (88.5 kg)  11/16/21 201 lb (91.2 kg)    Physical Exam Vitals and nursing note reviewed.  Constitutional:      General: She is not in acute distress.    Appearance: She is well-developed. She is not diaphoretic.  Eyes:     Conjunctiva/sclera: Conjunctivae normal.  Cardiovascular:     Rate and Rhythm: Normal rate and regular rhythm.     Heart sounds: Normal heart sounds. No murmur heard. Pulmonary:     Effort: Pulmonary effort is normal. No respiratory distress.     Breath sounds: Normal breath sounds. No wheezing.  Musculoskeletal:        General: Swelling present. No tenderness. Normal range of motion.  Skin:    General: Skin is warm and dry.     Findings: No rash.  Neurological:     Mental Status: She is alert and oriented to person, place, and time.     Coordination: Coordination normal.  Psychiatric:        Behavior: Behavior normal.       Assessment & Plan:   Problem List Items Addressed This Visit   None Visit Diagnoses     Sinus pressure    -  Primary   Relevant Medications   predniSONE (DELTASONE) 20 MG tablet   methylPREDNISolone acetate (DEPO-MEDROL) injection 80 mg (Start on 12/29/2021  2:30 PM)   Other Relevant Orders   CT Maxillofacial W/Cm   Recurrent sinusitis       Relevant Medications   predniSONE (DELTASONE) 20 MG  tablet   methylPREDNISolone acetate (DEPO-MEDROL) injection 80 mg (Start on 12/29/2021  2:30 PM)   Other Relevant Orders   CT Maxillofacial W/Cm   History of head and neck cancer       Relevant Medications   predniSONE (DELTASONE) 20 MG tablet   methylPREDNISolone acetate (DEPO-MEDROL) injection 80 mg (Start on 12/29/2021  2:30 PM)   Other Relevant Orders   CT Maxillofacial W/Cm       Likely recurrent inflammation or chronic due to head neck cancer and radiation, will give steroids, including steroid injection and oral steroids and see if we can calm this down.  We will also schedule for CT sinus to evaluate further. Follow up plan: Return if symptoms worsen or fail to improve.  Counseling provided for all of the vaccine components Orders Placed This Encounter  Procedures   CT Maxillofacial W/Cm    Caryl Pina, MD [facility] 12/29/2021, 2:21 PM

## 2022-01-10 DIAGNOSIS — R070 Pain in throat: Secondary | ICD-10-CM | POA: Diagnosis not present

## 2022-01-10 DIAGNOSIS — H9211 Otorrhea, right ear: Secondary | ICD-10-CM | POA: Diagnosis not present

## 2022-01-25 ENCOUNTER — Other Ambulatory Visit: Payer: Self-pay | Admitting: Otolaryngology

## 2022-01-30 ENCOUNTER — Ambulatory Visit
Admission: RE | Admit: 2022-01-30 | Discharge: 2022-01-30 | Disposition: A | Discharge status: Home / Self Care | Payer: BC Managed Care – PPO | Source: Ambulatory Visit | Attending: Family Medicine | Admitting: Family Medicine

## 2022-01-30 DIAGNOSIS — J3489 Other specified disorders of nose and nasal sinuses: Secondary | ICD-10-CM

## 2022-01-30 DIAGNOSIS — J329 Chronic sinusitis, unspecified: Secondary | ICD-10-CM | POA: Diagnosis not present

## 2022-01-30 DIAGNOSIS — Z8589 Personal history of malignant neoplasm of other organs and systems: Secondary | ICD-10-CM

## 2022-01-30 DIAGNOSIS — J342 Deviated nasal septum: Secondary | ICD-10-CM | POA: Diagnosis not present

## 2022-02-01 NOTE — Progress Notes (Signed)
CARDIOLOGY OFFICE NOTE  Date:  02/08/2022    [name] Date of Birth: 1961-06-15 Medical Record #[id number]  PCP:  Dettinger, Tammy Kaufmann, MD  Cardiologist:  [name]   No chief complaint on file.    History of Present Illness: [name] is a 61 y.o. female who presents for f/u Takatsubo DCM, Abnormal ECG, labile BP and chest pain   She has a history of chronically abnormal EKG. Remote normal cath in [facility] many years ago. Admitted in 2016 with chest pain - found to have Tkatsubo with EF 45% - no CAD by cath - MRI in 02/2015 showed normal EF. Hospital visit in 06/2015 for chest pain with negative evaluation.   Cardiac CT done 05/15/17 normal with calcium score 0  Seen in [place] for SSCP 07/06/18 R/O normal CXR thought to have gas Reviewed All records Troponin negative x 2   Has anxiety and depression   Seen in ER 08/20/19 for chest pain right sided ? Similar to esophageal pain with spasm ECG and troponins normal Told to f/u with GI   Now Working 5 days/week in [place]   Husband also a patient of mine and she gets overwhelmed at times with his health   Went to [place] for [employer] event with her husband He is a founder of large group and known Hydrographic surveyor poorly last few months Seeing ENT/GI Headaches with sinus pain sneezing and cough  Had atypical SSCP earlier this month ? Esophageal EMT did ECG I reviewed and normal other than LAD    Past Medical History:  Diagnosis Date   Anxiety    Arthritis    back (12/30/2014)   Asthma    as a child, no problems as an adult, has an albuterol inhaler   Cerebral cyst    per brain MRI 07-12-2019 unchanged 50m cyst inferomedial right frontal lobe   Chronic headaches    Chronic systolic (congestive) heart failure ([facility]    followed by dr nFrances Nickels  Complication of anesthesia    pt is very sensitive to benzodiazepines, pt stated "almost died"   Compression fracture of lumbar vertebra ([facility] 05/27/2020    per pt L3 and L4   D[place] ([place]ilated [place]) [place]    w/ Takatsubo---  followed by dr nJohnsie Vazquez--  stress-induced --- cardiac cath 12-30-2014 ef 30-35%, recovered per cardiac MRI 03-15-2015 ef  64%   Depression    Disorder of mastoid    per brain MRI 07-12-2019 persistant large mastoid effusion   Dysuria    Environmental and seasonal allergies    Frequency of urination    GERD (gastroesophageal reflux disease)    History of asthma    childhood   History of esophageal spasm    History of kidney stones    History of melanoma excision 2001   right supraorbital (right forehead and upper eyelid )  s/p  Moh's procedure w/ sln bx,  no recurrence per pt   History of non-ST elevation myocardial infarction (NSTEMI) 12/29/2014   per cath normal coronaries , ef 30-35%---  stress-- induced Takotsubo syndrome    History of palpitations 01/05/2015   STRESS INDUCED   History of parotid cancer 2008   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-2006 s/p  right lateral parotidectomy w/ nerve dissection , modified radical neck dissection ;  completed  x35 fractions raditation 2008;  no recurrence per pt   Hyperlipidemia    Hypertension  Kidney cysts    Left ureteral calculus    Low back pain    Melanoma ([place]) 2001   face   Muscle spasm of back    Myocardial infarction [facility]) 2016   Pneumonia    as a child   PONV (postoperative nausea and vomiting)    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   Squamous cell carcinoma of skin 02/22/2021   in [place]- right forehead (CX35FU)   Takotsubo syndrome 12/29/2014   cardiologist---  dr Frances Nickels--- dx NSTEMI -- stress-induced w/ DCM--  per cath 12-30-2014 normal coronaries , ef 30-35%,  recovered ef per cardiac MRI  ef 64%   Urgency of urination    Wears glasses     Past Surgical History:  Procedure Laterality Date   BACK SURGERY     BREAST BIOPSY Bilateral early 2000's   CARDIAC CATHETERIZATION  2006 (APPROX)  [place]    NORMAL   CARDIAC CATHETERIZATION  12/30/2014   CARDIAC CATHETERIZATION N/A 12/30/2014   Procedure: Left Heart Cath and Coronary Angiography;  Surgeon: Tammy Hampshire, MD;  Location: [facility];  Service: Cardiovascular;  Laterality: N/A;   CARDIOVASCULAR STRESS TEST  03-21-2012  DR [name]   NORMAL NUCLEAR STUDY/  NO ISCHEMIA/  EF 63%   COLONOSCOPY  10/2020   COLONOSCOPY WITH PROPOFOL  05/29/2017   CYSTOSCOPY W/ URETERAL STENT PLACEMENT Left 03/26/2013   Procedure: CYSTOSCOPY WITH RETROGRADE PYELOGRAM ;  Surgeon: Tammy Hazard, MD;  Location: [facility];  Service: Urology;  Laterality: Left;   CYSTOSCOPY WITH RETROGRADE PYELOGRAM, URETEROSCOPY AND STENT PLACEMENT Bilateral 03/19/2013   Procedure: CYSTOSCOPY WITH RETROGRADE PYELOGRAM, BILATERAL URETEROSCOPY AND STENT PLACEMENT LEFT URETER,BILATERAL STONE EXTRACTION , HOLMIUM LASER LEFT URETER;  Surgeon: Tammy Hazard, MD;  Location: [facility];  Service: Urology;  Laterality: Bilateral;   CYSTOSCOPY WITH STENT PLACEMENT Left 03/26/2013   Procedure: CYSTOSCOPY WITH STENT PLACEMENT;  Surgeon: Tammy Hazard, MD;  Location: [facility];  Service: Urology;  Laterality: Left;   CYSTOSCOPY/URETEROSCOPY/HOLMIUM LASER/STENT PLACEMENT Left 07/07/2020   Procedure: CYSTOSCOPY LEFT URETEROSCOPY/HOLMIUM LASER/STENT PLACEMENT;  Surgeon: Tammy Mallow, MD;  Location[facility];  Service: Urology;  Laterality: Left;   DIAGNOSTIC LAPAROSCOPY  04/12/2009   ESOPHAGOGASTRODUODENOSCOPY (EGD) WITH PROPOFOL N/A 02/10/2016   Procedure: ESOPHAGOGASTRODUODENOSCOPY (EGD) WITH PROPOFOL;  Surgeon: Ronald Lobo, MD;  Location: [facility];  Service: Endoscopy;  Laterality: N/A;   KIDNEY SURGERY  1966   BILATERAL URETER'S DILATATION   LUMBAR LAMINECTOMY/DECOMPRESSION MICRODISCECTOMY  05/18/2011   Procedure: LUMBAR LAMINECTOMY/DECOMPRESSION MICRODISCECTOMY;  Surgeon: Johnn Hai;   Location: [facility];  Service: Orthopedics;  Laterality: Right;  Decompression Lumbar 4-Lumbar 5  Right    (xray)    LUMBAR LAMINECTOMY/DECOMPRESSION MICRODISCECTOMY N/A 05/06/2021   Procedure: Microlumbar decompression L5-S1;  Surgeon: Susa Day, MD;  Location: [place];  Service: Orthopedics;  Laterality: N/A;  3 C-bed 90 mins   MELANOMA EXCISION WITH SENTINEL LYMPH NODE BIOPSY  2001   moh's procedure/  RIGHT FOREHEAD AND UPPER EYEBROW   RIGHT LATERAL PAROTIDECTOMY W/ NERVE DISSECTION / RIGHT MODIFIED RADICAL NECK DISSECTION SPARING SCM ELEVENTH NERVE AND INTERNAL JUGULAR VEIN  09-12-2006  DR DWIGHT BATES   DR DWIGHT BATES; "inside gland; lots of lymph nodes"     Medications: Current Meds  Medication Sig   acetaminophen (TYLENOL) 500 MG tablet Take 1,000 mg by mouth every 6 (six) hours as needed for moderate pain.   atorvastatin (  LIPITOR) 40 MG tablet Take 1 tablet (40 mg total) by mouth daily.   calcium carbonate (TUMS - DOSED IN MG ELEMENTAL CALCIUM) 500 MG chewable tablet Chew 1,000 mg by mouth daily as needed for indigestion or heartburn.   carvedilol (COREG) 3.125 MG tablet TAKE 1 TABLET BY MOUTH TWICE DAILY WITH A MEAL   docusate sodium (COLACE) 100 MG capsule Take 1 capsule (100 mg total) by mouth 2 (two) times daily as needed for mild constipation.   famotidine (PEPCID) 40 MG tablet Take 40 mg by mouth daily.   lidocaine (LIDODERM) 5 % Place 1 patch onto the skin daily as needed (pain).   lisinopril (ZESTRIL) 2.5 MG tablet TAKE 1 TABLET BY MOUTH IN THE MORNING   loratadine-pseudoephedrine (CLARITIN-D 24-HOUR) 10-240 MG 24 hr tablet Take 1 tablet by mouth as needed for allergies.   nitroGLYCERIN (NITROSTAT) 0.4 MG SL tablet Place 1 tablet (0.4 mg total) under the tongue every 5 (five) minutes as needed for chest pain. DISSOLVE ONE TABLET UNDER THE TONGUE EVERY 5 MINUTES AS NEEDED FOR CHEST PAIN.  DO NOT EXCEED A TOTAL OF 3 DOSES IN 15 MINUTES   polyethylene glycol (MIRALAX / GLYCOLAX)  17 g packet Take 17 g by mouth daily.   silver sulfADIAZINE (SILVADENE) 1 % cream Apply 1 application topically daily. Apply to shoulder nightly 7-14 days     Allergies: Allergies  Allergen Reactions   Cephalexin Shortness Of Breath and Rash   Lorazepam Shortness Of Breath and Other (See Comments)    She may be very sensitive to benzo.    Stops breathing    Promethazine     Felt strange, "knocked me out"    Social History: The patient  reports that she has never smoked. She has never used smokeless tobacco. She reports that she does not currently use alcohol. She reports that she does not use drugs.   Family History: The patient's family history includes COPD in her mother; Cancer in her father and mother; Dementia in her mother; Heart disease in her father; Hyperlipidemia in her father; Hypertension in her father and mother.   Review of Systems: Please see the history of present illness.   Otherwise, the review of systems is positive for none.   All other systems are reviewed and negative.   Physical Exam: VS:  BP 132/68 (BP Location: Left Arm, Patient Position: Sitting, Cuff Size: Normal)   Pulse 71   Ht '5\' 4"'$  (1.626 m)   Wt 203 lb (92.1 kg)   LMP 05/18/2011   SpO2 98%   BMI 34.84 kg/m  .  BMI Body mass index is 34.84 kg/m.  Wt Readings from Last 3 Encounters:  02/08/22 203 lb (92.1 kg)  02/03/22 202 lb (91.6 kg)  12/29/21 197 lb (89.4 kg)   Affect appropriate Healthy:  appears stated age 1: normal Neck supple with no adenopathy JVP normal no bruits no thyromegaly Lungs clear with no wheezing and good diaphragmatic motion Heart:  S1/S2 no murmur, no rub, gallop or click PMI normal Abdomen: benighn, BS positve, no tenderness, no AAA no bruit.  No HSM or HJR Distal pulses intact with no bruits No edema Neuro non-focal Skin warm and dry No muscular weakness    LABORATORY DATA:  EKG:  03/28/17  NSR with anterior T wave changes chronic .  [facility] Results   Component Value Date   WBC 3.1 (L) 02/03/2022   HGB 12.8 02/03/2022   HCT 38.8 02/03/2022   PLT 158  02/03/2022   GLUCOSE 100 (H) 02/03/2022   CHOL 299 (H) 06/24/2021   TRIG 162 (H) 06/24/2021   HDL 57 06/24/2021   LDLCALC 212 (H) 06/24/2021   ALT 30 02/03/2022   AST 29 02/03/2022   NA 145 (H) 02/03/2022   K 4.2 02/03/2022   CL 106 02/03/2022   CREATININE 0.93 02/03/2022   BUN 17 02/03/2022   CO2 24 02/03/2022   TSH 1.250 02/03/2022   INR 1.10 12/30/2014   HGBA1C 5.5 01/03/2017     BNP (last 3 results) No results for input(s): "BNP" in the last 8760 hours.  ProBNP (last 3 results) No results for input(s): "PROBNP" in the last 8760 hours.   Other Studies Reviewed Today:  Cardiac MRI IMPRESSION 02/2015: 1) Normal LV size and function   2) Quantitative EF 64% no RWMA;s   3) No scar tissue or hyperenhancement   Overall findings consistent with Takatsubo DCM   Electronically Signed   By: Jenkins Rouge M.D.   On: 03/18/2015 16:52   Echo Study Conclusions from 12/2014   - Left ventricle: The cavity size was normal. Systolic function was   mildly reduced. The estimated ejection fraction was 45%. Diffuse   hypokinesis. Doppler parameters are consistent with abnormal left   ventricular relaxation (grade 1 diastolic dysfunction). There was   no evidence of elevated ventricular filling pressure by Doppler   parameters. - Aortic valve: Trileaflet; normal thickness leaflets. There was   trivial regurgitation. - Aortic root: The aortic root was normal in size. - Mitral valve: Structurally normal valve. There was no   regurgitation. - Left atrium: The atrium was normal in size. - Right ventricle: Systolic function was normal. - Right atrium: The atrium was normal in size. - Tricuspid valve: There was trivial regurgitation. - Pulmonic valve: There was no regurgitation. - Pulmonary arteries: Systolic pressure was within the normal   range. - Inferior vena cava: The  vessel was normal in size. - Pericardium, extracardiac: There was no pericardial effusion.    Procedures   Left Heart Cath and Coronary Angiography 12/2014  Conclusion   1. Normal coronary arteries. 2. Moderately to severely reduced LV systolic function with an ejection fraction of 30-35% with severe hypokinesis of the mid distal anterior, apical and mid to distal inferior walls consistent with stress-induced cardiomyopathy. 3. Mildly elevated left ventricular end-diastolic pressure.   Recommendations: Recommend a small dose beta blocker and ACE inhibitor. Avoid stress. Monitor for at least another day and possible discharge home tomorrow if she remains stable.     Assessment/Plan:  1. Chest pain: no CAD cath 2016    Normal cardiac CT with calcium score 0 05/15/17 non cardiac pain Observe  History of esophageal spasm f/u GI   2. HTN - improved continue current meds   3. Takotsubo DCM  - EF 64% recovered by MRI 03/18/15 . She has no symptoms of CHF. Would keep on her current regimen for now.  At risk for recurrence given current anxiety and depression   4. HLD:  on statin LDL 105 acceptable with calcium score 0   5. GERD:  low carb diet continue pantoprazole     F/U in a year   Jenkins Rouge

## 2022-02-02 DIAGNOSIS — R079 Chest pain, unspecified: Secondary | ICD-10-CM | POA: Diagnosis not present

## 2022-02-03 ENCOUNTER — Encounter: Payer: Self-pay | Admitting: Family Medicine

## 2022-02-03 ENCOUNTER — Ambulatory Visit: Payer: BC Managed Care – PPO | Admitting: Family Medicine

## 2022-02-03 VITALS — BP 114/75 | HR 76 | Temp 98.0°F | Ht 64.0 in | Wt 202.0 lb

## 2022-02-03 DIAGNOSIS — Z85820 Personal history of malignant melanoma of skin: Secondary | ICD-10-CM

## 2022-02-03 DIAGNOSIS — R519 Headache, unspecified: Secondary | ICD-10-CM

## 2022-02-03 NOTE — Progress Notes (Signed)
BP 114/75   Pulse 76   Temp 98 F (36.7 C)   Ht 5' 4" (1.626 m)   Wt 202 lb (91.6 kg)   LMP 05/18/2011   SpO2 96%   BMI 34.67 kg/m    Subjective:   Patient ID: [name], female    DOB: March 27, 1961, 61 y.o.   MRN: [id number]  HPI: [name] is a 61 y.o. female presenting on 02/03/2022 for Discuss recent CT and requesting labs and Headache   HPI Patient has continued to have headaches congestion and sinus pressure.  She saw her ENT and had a CT scan of the head neck and showed no abnormalities.  She was prescribed Augmentin and has been taking it for at least 5 days and says is not getting better.  She is still sneezing and has right-sided frontal headaches and ear pressure on that right side and feels like she has drainage and sore throat.  The headaches have been persistent and she feels like they are just not getting better except for when she takes Excedrin all of the time.  She does have previous head and neck surgery.  Patient did have an episode more than 24 hours ago where she had substernal chest tightness and pressure and called EMS and they evaluated her but she refused to go to the hospital and she says she is better today and she does have an appoint with cardiology next week and is not having those symptoms today.  Patient had a dermatologist with a history of melanoma but her dermatologist is leaving and so she needs a new one.  Relevant past medical, surgical, family and social history reviewed and updated as indicated. Interim medical history since our last visit reviewed. Allergies and medications reviewed and updated.  Review of Systems  Constitutional:  Negative for chills and fever.  HENT:  Positive for congestion, ear discharge, ear pain and sore throat. Negative for hearing loss.   Eyes:  Negative for redness and visual disturbance.  Respiratory:  Negative for chest tightness and shortness of breath.   Cardiovascular:  Negative for chest pain and  leg swelling.  Genitourinary:  Negative for difficulty urinating and dysuria.  Musculoskeletal:  Negative for back pain and gait problem.  Skin:  Negative for rash.  Neurological:  Positive for headaches. Negative for dizziness, weakness and light-headedness.  Psychiatric/Behavioral:  Negative for agitation and behavioral problems.   All other systems reviewed and are negative.   Per HPI unless specifically indicated above   Allergies as of 02/03/2022       Reactions   Cephalexin Shortness Of Breath, Rash   Lorazepam Shortness Of Breath, Other (See Comments)   She may be very sensitive to benzo.    Stops breathing    Promethazine    Felt strange, "knocked me out"        Medication List        Accurate as of February 03, 2022 11:36 AM. If you have any questions, ask your nurse or doctor.          acetaminophen 500 MG tablet Commonly known as: TYLENOL Take 1,000 mg by mouth every 6 (six) hours as needed for moderate pain.   atorvastatin 40 MG tablet Commonly known as: LIPITOR Take 1 tablet (40 mg total) by mouth daily.   calcium carbonate 500 MG chewable tablet Commonly known as: TUMS - dosed in mg elemental calcium Chew 1,000 mg by mouth daily as needed for indigestion or  heartburn.   carvedilol 3.125 MG tablet Commonly known as: COREG TAKE 1 TABLET BY MOUTH TWICE DAILY WITH A MEAL   cetirizine 10 MG tablet Commonly known as: ZYRTEC Take by mouth.   docusate sodium 100 MG capsule Commonly known as: Colace Take 1 capsule (100 mg total) by mouth 2 (two) times daily as needed for mild constipation.   famotidine 40 MG tablet Commonly known as: PEPCID Take 40 mg by mouth daily.   fexofenadine-pseudoephedrine 180-240 MG 24 hr tablet Commonly known as: ALLEGRA-D 24 Take 1 tablet by mouth as needed.   lidocaine 5 % Commonly known as: LIDODERM Place 1 patch onto the skin daily as needed (pain).   lisinopril 2.5 MG tablet Commonly known as: ZESTRIL TAKE 1  TABLET BY MOUTH IN THE MORNING   loratadine-pseudoephedrine 10-240 MG 24 hr tablet Commonly known as: CLARITIN-D 24-hour Take 1 tablet by mouth as needed for allergies.   nitroGLYCERIN 0.4 MG SL tablet Commonly known as: NITROSTAT Place 1 tablet (0.4 mg total) under the tongue every 5 (five) minutes as needed for chest pain. DISSOLVE ONE TABLET UNDER THE TONGUE EVERY 5 MINUTES AS NEEDED FOR CHEST PAIN.  DO NOT EXCEED A TOTAL OF 3 DOSES IN 15 MINUTES   polyethylene glycol 17 g packet Commonly known as: MIRALAX / GLYCOLAX Take 17 g by mouth daily.   predniSONE 20 MG tablet Commonly known as: DELTASONE Take 3 tabs daily for 1 week, then 2 tabs daily for week 2, then 1 tab daily for week 3.   silver sulfADIAZINE 1 % cream Commonly known as: Silvadene Apply 1 application topically daily. Apply to shoulder nightly 7-14 days         Objective:   BP 114/75   Pulse 76   Temp 98 F (36.7 C)   Ht 5' 4" (1.626 m)   Wt 202 lb (91.6 kg)   LMP 05/18/2011   SpO2 96%   BMI 34.67 kg/m   Wt Readings from Last 3 Encounters:  02/03/22 202 lb (91.6 kg)  12/29/21 197 lb (89.4 kg)  12/15/21 195 lb (88.5 kg)    Physical Exam Vitals and nursing note reviewed.  Constitutional:      General: She is not in acute distress.    Appearance: Normal appearance. She is well-developed. She is not diaphoretic.  Eyes:     Conjunctiva/sclera: Conjunctivae normal.  Cardiovascular:     Rate and Rhythm: Normal rate and regular rhythm.     Heart sounds: Normal heart sounds. No murmur heard. Pulmonary:     Effort: Pulmonary effort is normal. No respiratory distress.     Breath sounds: Normal breath sounds. No wheezing.  Musculoskeletal:        General: No swelling or tenderness. Normal range of motion.  Skin:    General: Skin is warm and dry.     Findings: No rash.  Neurological:     Mental Status: She is alert and oriented to person, place, and time.     Coordination: Coordination normal.   Psychiatric:        Behavior: Behavior normal.       Assessment & Plan:   Problem List Items Addressed This Visit   None Visit Diagnoses     Bilateral headaches    -  Primary   Relevant Orders   Ambulatory referral to Neurology   CBC with Differential/Platelet   CMP14+EGFR   TSH   History of melanoma       Relevant Orders  Ambulatory referral to Dermatology   CBC with Differential/Platelet   CMP14+EGFR   TSH       Continue current medicine, will refer to dermatology neurology to replace her dermatologist and neurology for headaches to see if we can find a cause for why she is having them more consistently and more severely. Follow up plan: Return if symptoms worsen or fail to improve, for headaches.  Counseling provided for all of the vaccine components Orders Placed This Encounter  Procedures   CBC with Differential/Platelet   CMP14+EGFR   TSH   Ambulatory referral to Neurology   Ambulatory referral to Dermatology    Joshua Dettinger, MD [facility] 02/03/2022, 11:36 AM     

## 2022-02-04 LAB — CBC WITH DIFFERENTIAL/PLATELET
Basophils Absolute: 0 10*3/uL (ref 0.0–0.2)
Basos: 0 %
EOS (ABSOLUTE): 0.1 10*3/uL (ref 0.0–0.4)
Eos: 4 %
Hematocrit: 38.8 % (ref 34.0–46.6)
Hemoglobin: 12.8 g/dL (ref 11.1–15.9)
Immature Grans (Abs): 0 10*3/uL (ref 0.0–0.1)
Immature Granulocytes: 0 %
Lymphocytes Absolute: 1 10*3/uL (ref 0.7–3.1)
Lymphs: 32 %
MCH: 32.7 pg (ref 26.6–33.0)
MCHC: 33 g/dL (ref 31.5–35.7)
MCV: 99 fL — ABNORMAL HIGH (ref 79–97)
Monocytes Absolute: 0.3 10*3/uL (ref 0.1–0.9)
Monocytes: 10 %
Neutrophils Absolute: 1.7 10*3/uL (ref 1.4–7.0)
Neutrophils: 54 %
Platelets: 158 10*3/uL (ref 150–450)
RBC: 3.92 x10E6/uL (ref 3.77–5.28)
RDW: 13.2 % (ref 11.7–15.4)
WBC: 3.1 10*3/uL — ABNORMAL LOW (ref 3.4–10.8)

## 2022-02-04 LAB — CMP14+EGFR
ALT: 30 IU/L (ref 0–32)
AST: 29 IU/L (ref 0–40)
Albumin/Globulin Ratio: 2.2 (ref 1.2–2.2)
Albumin: 4.2 g/dL (ref 3.9–4.9)
Alkaline Phosphatase: 59 IU/L (ref 44–121)
BUN/Creatinine Ratio: 18 (ref 12–28)
BUN: 17 mg/dL (ref 8–27)
Bilirubin Total: <0.2 mg/dL (ref 0.0–1.2)
CO2: 24 mmol/L (ref 20–29)
Calcium: 8.9 mg/dL (ref 8.7–10.3)
Chloride: 106 mmol/L (ref 96–106)
Creatinine, Ser: 0.93 mg/dL (ref 0.57–1.00)
Globulin, Total: 1.9 g/dL (ref 1.5–4.5)
Glucose: 100 mg/dL — ABNORMAL HIGH (ref 70–99)
Potassium: 4.2 mmol/L (ref 3.5–5.2)
Sodium: 145 mmol/L — ABNORMAL HIGH (ref 134–144)
Total Protein: 6.1 g/dL (ref 6.0–8.5)
eGFR: 69 mL/min/{1.73_m2} (ref >59)

## 2022-02-04 LAB — TSH: TSH: 1.25 u[IU]/mL (ref 0.450–4.500)

## 2022-02-07 ENCOUNTER — Encounter: Payer: Self-pay | Admitting: Neurology

## 2022-02-08 ENCOUNTER — Ambulatory Visit: Payer: BC Managed Care – PPO | Admitting: Cardiovascular Disease

## 2022-02-08 ENCOUNTER — Other Ambulatory Visit (HOSPITAL_COMMUNITY): Payer: Self-pay | Admitting: Otolaryngology

## 2022-02-08 ENCOUNTER — Other Ambulatory Visit: Payer: Self-pay | Admitting: Otolaryngology

## 2022-02-08 ENCOUNTER — Encounter: Payer: Self-pay | Admitting: Cardiovascular Disease

## 2022-02-08 VITALS — BP 132/68 | HR 71 | Ht 64.0 in | Wt 203.0 lb

## 2022-02-08 DIAGNOSIS — R519 Headache, unspecified: Secondary | ICD-10-CM | POA: Diagnosis not present

## 2022-02-08 DIAGNOSIS — E782 Mixed hyperlipidemia: Secondary | ICD-10-CM

## 2022-02-08 DIAGNOSIS — R079 Chest pain, unspecified: Secondary | ICD-10-CM | POA: Diagnosis not present

## 2022-02-08 DIAGNOSIS — I10 Essential (primary) hypertension: Secondary | ICD-10-CM

## 2022-02-08 DIAGNOSIS — R070 Pain in throat: Secondary | ICD-10-CM

## 2022-02-08 MED ORDER — CARVEDILOL 3.125 MG PO TABS: 3.1250 mg | ORAL_TABLET | Freq: Two times a day (BID) | ORAL | 3 refills | Status: DC

## 2022-02-08 MED ORDER — ATORVASTATIN CALCIUM 40 MG PO TABS: 40.0000 mg | ORAL_TABLET | Freq: Every day | ORAL | 3 refills | Status: DC

## 2022-02-08 MED ORDER — NITROGLYCERIN 0.4 MG SL SUBL: 0.4000 mg | SUBLINGUAL_TABLET | SUBLINGUAL | 3 refills | Status: DC | PRN

## 2022-02-08 MED ORDER — LISINOPRIL 2.5 MG PO TABS: 2.5000 mg | ORAL_TABLET | Freq: Every morning | ORAL | 3 refills | Status: DC

## 2022-02-08 NOTE — Patient Instructions (Addendum)

## 2022-02-09 ENCOUNTER — Other Ambulatory Visit (HOSPITAL_COMMUNITY): Payer: Self-pay | Admitting: Otolaryngology

## 2022-02-09 DIAGNOSIS — R070 Pain in throat: Secondary | ICD-10-CM

## 2022-02-22 ENCOUNTER — Ambulatory Visit: Payer: BC Managed Care – PPO | Admitting: Dermatology

## 2022-03-06 ENCOUNTER — Ambulatory Visit: Payer: BC Managed Care – PPO | Admitting: Family Medicine

## 2022-03-06 DIAGNOSIS — R300 Dysuria: Secondary | ICD-10-CM | POA: Diagnosis not present

## 2022-03-06 DIAGNOSIS — R109 Unspecified abdominal pain: Secondary | ICD-10-CM | POA: Diagnosis not present

## 2022-03-06 DIAGNOSIS — Z87442 Personal history of urinary calculi: Secondary | ICD-10-CM

## 2022-03-06 LAB — URINALYSIS, ROUTINE W REFLEX MICROSCOPIC
Bilirubin, UA: NEGATIVE
Glucose, UA: NEGATIVE
Ketones, UA: NEGATIVE
Nitrite, UA: NEGATIVE
Protein,UA: NEGATIVE
Specific Gravity, UA: 1.01 (ref 1.005–1.030)
Urobilinogen, Ur: 0.2 mg/dL (ref 0.2–1.0)
pH, UA: 6 (ref 5.0–7.5)

## 2022-03-06 LAB — MICROSCOPIC EXAMINATION
Bacteria, UA: NONE SEEN
Renal Epithel, UA: NONE SEEN /hpf

## 2022-03-06 MED ORDER — TAMSULOSIN HCL 0.4 MG PO CAPS: 0.4000 mg | ORAL_CAPSULE | Freq: Every day | ORAL | 3 refills | Status: DC

## 2022-03-06 MED ORDER — HYDROCODONE-ACETAMINOPHEN 5-325 MG PO TABS: 1.0000 | ORAL_TABLET | Freq: Four times a day (QID) | ORAL | 0 refills | Status: DC | PRN

## 2022-03-06 NOTE — Progress Notes (Signed)
Virtual Visit  Note Due to COVID-19 pandemic this visit was conducted virtually. This visit type was conducted due to national recommendations for restrictions regarding the COVID-19 Pandemic (e.g. social distancing, sheltering in place) in an effort to limit this patient's exposure and mitigate transmission in our community. All issues noted in this document were discussed and addressed.  A physical exam was not performed with this format.  I connected with [name] on 03/06/22 at 1520 by telephone and verified that I am speaking with the correct person using two identifiers. [name] is currently located at home and her daughter is currently with her during the visit. The provider, Gwenlyn Perking, FNP is located in their office at time of visit.  I discussed the limitations, risks, security and privacy concerns of performing an evaluation and management service by telephone and the availability of in person appointments. I also discussed with the patient that there may be a patient responsible charge related to this service. The patient expressed understanding and agreed to proceed.   History and Present Illness:  HPI She reports bilateral flank pain x 3 days. It is moderate pain. It is a constant, dull, achy pain. She also reports urgency and frequency for 3 days. She has had some mild dysuria. She also reports some dysuria. She has a history of kidney stones but reprots that this pain is different. She denies fever or chills. She has tried increasing her water intake and this has not helped. She also has been a little nausea but has not had vomiting. Denies history of UTI. Denies vaginal itchy, pain, or itchy.    ROS As per HPI.   Observations/Objective: Discussed symptomatic care and return precautions.   Urine dipstick shows positive for RBC's and positive for leukocytes.  Micro exam: 0-5 WBC's per HPF, 0-2 RBC's per HPF, and no bacteria.   Assessment and  Plan: [name] was seen today for urinary tract infection.  Diagnoses and all orders for this visit:  Dysuria -     Urinalysis, Routine w reflex microscopic -     Urine Culture -     HYDROcodone-acetaminophen (NORCO) 5-325 MG tablet; Take 1 tablet by mouth every 6 (six) hours as needed for moderate pain. -     tamsulosin (FLOMAX) 0.4 MG CAPS capsule; Take 1 capsule (0.4 mg total) by mouth daily.  Flank pain -     HYDROcodone-acetaminophen (NORCO) 5-325 MG tablet; Take 1 tablet by mouth every 6 (six) hours as needed for moderate pain. -     tamsulosin (FLOMAX) 0.4 MG CAPS capsule; Take 1 capsule (0.4 mg total) by mouth daily.  History of kidney stones -     HYDROcodone-acetaminophen (NORCO) 5-325 MG tablet; Take 1 tablet by mouth every 6 (six) hours as needed for moderate pain. -     tamsulosin (FLOMAX) 0.4 MG CAPS capsule; Take 1 capsule (0.4 mg total) by mouth daily.  UA negative for UTI today. Will send for culture. Significant hx of kidney stones. Declined renal stone study today. No fever. Will treat empirically for kidney stones with Flomax and norco prn. Increase fluids. Strict return precautions given.     Follow Up Instructions: Return to office for new or worsening symptoms, or if symptoms persist.     I discussed the assessment and treatment plan with the patient. The patient was provided an opportunity to ask questions and all were answered. The patient agreed with the plan and demonstrated an understanding of the  instructions.   The patient was advised to call back or seek an in-person evaluation if the symptoms worsen or if the condition fails to improve as anticipated.  The above assessment and management plan was discussed with the patient. The patient verbalized understanding of and has agreed to the management plan. Patient is aware to call the clinic if symptoms persist or worsen. Patient is aware when to return to the clinic for a follow-up visit. Patient educated on  when it is appropriate to go to the emergency department.   Time call ended:  1532  I provided 12 minutes of  non face-to-face time during this encounter.    Gwenlyn Perking, FNP

## 2022-03-08 ENCOUNTER — Ambulatory Visit (HOSPITAL_COMMUNITY)
Admission: RE | Admit: 2022-03-08 | Discharge: 2022-03-08 | Disposition: A | Discharge status: Home / Self Care | Payer: BC Managed Care – PPO | Source: Ambulatory Visit | Attending: Otolaryngology | Admitting: Otolaryngology

## 2022-03-08 ENCOUNTER — Other Ambulatory Visit (HOSPITAL_COMMUNITY): Payer: Self-pay | Admitting: Otolaryngology

## 2022-03-08 DIAGNOSIS — R070 Pain in throat: Secondary | ICD-10-CM | POA: Diagnosis not present

## 2022-03-08 LAB — URINE CULTURE

## 2022-03-09 ENCOUNTER — Telehealth: Payer: Self-pay | Admitting: Family Medicine

## 2022-03-10 NOTE — Telephone Encounter (Signed)
Patient returning call. Please call back

## 2022-03-10 NOTE — Telephone Encounter (Signed)
Lab results have been given to pt. Looks like she needs to be made aware of urine results.  Left message for pt to return call. We are unable to leave a detailed message on the phone. Advised pt that I could print off results and she could pick up as well.

## 2022-03-21 DIAGNOSIS — R1310 Dysphagia, unspecified: Secondary | ICD-10-CM | POA: Diagnosis not present

## 2022-03-21 DIAGNOSIS — K219 Gastro-esophageal reflux disease without esophagitis: Secondary | ICD-10-CM | POA: Diagnosis not present

## 2022-03-21 DIAGNOSIS — R1314 Dysphagia, pharyngoesophageal phase: Secondary | ICD-10-CM | POA: Diagnosis not present

## 2022-03-30 DIAGNOSIS — R1310 Dysphagia, unspecified: Secondary | ICD-10-CM | POA: Diagnosis not present

## 2022-03-30 DIAGNOSIS — K219 Gastro-esophageal reflux disease without esophagitis: Secondary | ICD-10-CM | POA: Diagnosis not present

## 2022-03-30 DIAGNOSIS — K2289 Other specified disease of esophagus: Secondary | ICD-10-CM | POA: Diagnosis not present

## 2022-03-30 DIAGNOSIS — B3781 Candidal esophagitis: Secondary | ICD-10-CM | POA: Diagnosis not present

## 2022-03-30 DIAGNOSIS — K2970 Gastritis, unspecified, without bleeding: Secondary | ICD-10-CM | POA: Diagnosis not present

## 2022-03-30 DIAGNOSIS — K2930 Chronic superficial gastritis without bleeding: Secondary | ICD-10-CM | POA: Diagnosis not present

## 2022-03-30 DIAGNOSIS — K229 Disease of esophagus, unspecified: Secondary | ICD-10-CM | POA: Diagnosis not present

## 2022-03-31 ENCOUNTER — Encounter: Payer: Self-pay | Admitting: Family Medicine

## 2022-03-31 ENCOUNTER — Ambulatory Visit: Payer: BC Managed Care – PPO | Admitting: Family Medicine

## 2022-03-31 VITALS — BP 142/75 | HR 72 | Temp 97.6°F | Ht 64.0 in | Wt 205.0 lb

## 2022-03-31 DIAGNOSIS — Z87442 Personal history of urinary calculi: Secondary | ICD-10-CM

## 2022-03-31 DIAGNOSIS — N200 Calculus of kidney: Secondary | ICD-10-CM | POA: Diagnosis not present

## 2022-03-31 DIAGNOSIS — R1031 Right lower quadrant pain: Secondary | ICD-10-CM | POA: Diagnosis not present

## 2022-03-31 DIAGNOSIS — R911 Solitary pulmonary nodule: Secondary | ICD-10-CM

## 2022-03-31 DIAGNOSIS — M545 Low back pain, unspecified: Secondary | ICD-10-CM

## 2022-03-31 DIAGNOSIS — R3129 Other microscopic hematuria: Secondary | ICD-10-CM | POA: Diagnosis not present

## 2022-03-31 DIAGNOSIS — G8929 Other chronic pain: Secondary | ICD-10-CM

## 2022-03-31 DIAGNOSIS — R935 Abnormal findings on diagnostic imaging of other abdominal regions, including retroperitoneum: Secondary | ICD-10-CM

## 2022-03-31 DIAGNOSIS — R109 Unspecified abdominal pain: Secondary | ICD-10-CM | POA: Diagnosis not present

## 2022-03-31 LAB — URINALYSIS, ROUTINE W REFLEX MICROSCOPIC
Bilirubin, UA: NEGATIVE
Glucose, UA: NEGATIVE
Ketones, UA: NEGATIVE
Leukocytes,UA: NEGATIVE
Nitrite, UA: NEGATIVE
Protein,UA: NEGATIVE
Specific Gravity, UA: <1.005 — ABNORMAL LOW (ref 1.005–1.030)
Urobilinogen, Ur: 0.2 mg/dL (ref 0.2–1.0)
pH, UA: 6.5 (ref 5.0–7.5)

## 2022-03-31 LAB — MICROSCOPIC EXAMINATION
Bacteria, UA: NONE SEEN
Epithelial Cells (non renal): NONE SEEN /hpf (ref 0–10)
Renal Epithel, UA: NONE SEEN /hpf
WBC, UA: NONE SEEN /hpf (ref 0–5)

## 2022-03-31 MED ORDER — KETOROLAC TROMETHAMINE 30 MG/ML IJ SOLN
30.0000 mg | Freq: Once | INTRAMUSCULAR | Status: AC
  Administered 2022-03-31: 30 mg via INTRAMUSCULAR

## 2022-03-31 NOTE — Progress Notes (Signed)
Subjective:  Patient ID: [name], female    DOB: 1960/11/09, 61 y.o.   MRN: [id number]  Patient Care Team: Dettinger, Fransisca Kaufmann, MD as PCP - General (Family Medicine) Josue Hector, MD as PCP - Cardiology (Cardiology) Lavonna Monarch, MD (Inactive) as Consulting Physician (Dermatology)   Chief Complaint:  Flank Pain (Right flank pain that has been going on 03/06/22 and pain has gotten worse. )   HPI: [name] is a 61 y.o. female presenting on 03/31/2022 for Flank Pain (Right flank pain that has been going on 03/06/22 and pain has gotten worse. )   Seen via virtual visit 03/06/2022 for same complaints. Empirically treated for renal stones with Flomax and Norco. Declined imaging at this time. Returns to office today with continued right flank pain that radiates to right groin.   Flank Pain The problem has been waxing and waning since onset. Pain location: right flank. The quality of the pain is described as aching, shooting and stabbing. Radiates to: right groin. The pain is moderate. Associated symptoms include abdominal pain. Pertinent negatives include no bladder incontinence, bowel incontinence, chest pain, dysuria, fever, headaches, leg pain, numbness, paresis, paresthesias, pelvic pain, perianal numbness, tingling, weakness or weight loss. She has tried analgesics (increased fluids, Flomax) for the symptoms. The treatment provided mild relief.     Relevant past medical, surgical, family, and social history reviewed and updated as indicated.  Allergies and medications reviewed and updated. Data reviewed: Chart in Epic.   Past Medical History:  Diagnosis Date   Anxiety    Arthritis    back (12/30/2014)   Asthma    as a child, no problems as an adult, has an albuterol inhaler   Cerebral cyst    per brain MRI 07-12-2019 unchanged 42m cyst inferomedial right frontal lobe   Chronic headaches    Chronic systolic (congestive) heart failure ([facility]    followed by dr  nFrances Nickels  Complication of anesthesia    pt is very sensitive to benzodiazepines, pt stated "almost died"   Compression fracture of lumbar vertebra ([facility] 05/27/2020   per pt L3 and L4   DCM (dilated cardiomyopathy) ([facility]    w/ Takatsubo---  followed by dr nJohnsie Cancel--  stress-induced --- cardiac cath 12-30-2014 ef 30-35%, recovered per cardiac MRI 03-15-2015 ef  64%   Depression    Disorder of mastoid    per brain MRI 07-12-2019 persistant large mastoid effusion   Dysuria    Environmental and seasonal allergies    Frequency of urination    GERD (gastroesophageal reflux disease)    History of asthma    childhood   History of esophageal spasm    History of kidney stones    History of melanoma excision 2001   right supraorbital (right forehead and upper eyelid )  s/p  Moh's procedure w/ sln bx,  no recurrence per pt   History of non-ST elevation myocardial infarction (NSTEMI) 12/29/2014   per cath normal coronaries , ef 30-35%---  stress-- induced Takotsubo syndrome    History of palpitations 01/05/2015   STRESS INDUCED   History of parotid cancer 2008   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-2006 s/p  right lateral parotidectomy w/ nerve dissection , modified radical neck dissection ;  completed  x35 fractions raditation 2008;  no recurrence per pt   Hyperlipidemia    Hypertension    Kidney cysts    Left ureteral calculus    Low back  pain    Melanoma ([place]) 2001   face   Muscle spasm of back    Myocardial infarction [facility]) 2016   Pneumonia    as a child   PONV (postoperative nausea and vomiting)    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   Squamous cell carcinoma of skin 02/22/2021   in istu- right forehead ([id number])   Takotsubo syndrome 12/29/2014   cardiologist---  dr Frances Nickels--- dx NSTEMI -- stress-induced w/ DCM--  per cath 12-30-2014 normal coronaries , ef 30-35%,  recovered ef per cardiac MRI  ef 64%   Urgency of urination    Wears glasses      Past Surgical History:  Procedure Laterality Date   BACK SURGERY     BREAST BIOPSY Bilateral early 2000's   CARDIAC CATHETERIZATION  2006 (APPROX)  [place]   NORMAL   CARDIAC CATHETERIZATION  12/30/2014   CARDIAC CATHETERIZATION N/A 12/30/2014   Procedure: Left Heart Cath and Coronary Angiography;  Surgeon: Wellington Hampshire, MD;  Location: [facility];  Service: Cardiovascular;  Laterality: N/A;   CARDIOVASCULAR STRESS TEST  03-21-2012  DR Johnsie Cancel   NORMAL NUCLEAR STUDY/  NO ISCHEMIA/  EF 63%   COLONOSCOPY  10/2020   COLONOSCOPY WITH PROPOFOL  05/29/2017   CYSTOSCOPY W/ URETERAL STENT PLACEMENT Left 03/26/2013   Procedure: CYSTOSCOPY WITH RETROGRADE PYELOGRAM ;  Surgeon: Molli Hazard, MD;  Location: [facility];  Service: Urology;  Laterality: Left;   CYSTOSCOPY WITH RETROGRADE PYELOGRAM, URETEROSCOPY AND STENT PLACEMENT Bilateral 03/19/2013   Procedure: CYSTOSCOPY WITH RETROGRADE PYELOGRAM, BILATERAL URETEROSCOPY AND STENT PLACEMENT LEFT URETER,BILATERAL STONE EXTRACTION , HOLMIUM LASER LEFT URETER;  Surgeon: Molli Hazard, MD;  Location: [facility];  Service: Urology;  Laterality: Bilateral;   CYSTOSCOPY WITH STENT PLACEMENT Left 03/26/2013   Procedure: CYSTOSCOPY WITH STENT PLACEMENT;  Surgeon: Molli Hazard, MD;  Location: [facility];  Service: Urology;  Laterality: Left;   CYSTOSCOPY/URETEROSCOPY/HOLMIUM LASER/STENT PLACEMENT Left 07/07/2020   Procedure: CYSTOSCOPY LEFT URETEROSCOPY/HOLMIUM LASER/STENT PLACEMENT;  Surgeon: Lucas Mallow, MD;  Location: [facility];  Service: Urology;  Laterality: Left;   DIAGNOSTIC LAPAROSCOPY  04/12/2009   ESOPHAGOGASTRODUODENOSCOPY (EGD) WITH PROPOFOL N/A 02/10/2016   Procedure: ESOPHAGOGASTRODUODENOSCOPY (EGD) WITH PROPOFOL;  Surgeon: Ronald Lobo, MD;  Location: [facility];  Service: Endoscopy;  Laterality: N/A;   KIDNEY SURGERY  1966   BILATERAL URETER'S  DILATATION   LUMBAR LAMINECTOMY/DECOMPRESSION MICRODISCECTOMY  05/18/2011   Procedure: LUMBAR LAMINECTOMY/DECOMPRESSION MICRODISCECTOMY;  Surgeon: Johnn Hai;  Location: [facility];  Service: Orthopedics;  Laterality: Right;  Decompression Lumbar 4-Lumbar 5  Right    (xray)    LUMBAR LAMINECTOMY/DECOMPRESSION MICRODISCECTOMY N/A 05/06/2021   Procedure: Microlumbar decompression L5-S1;  Surgeon: Susa Day, MD;  Location: [facility];  Service: Orthopedics;  Laterality: N/A;  3 C-bed 90 mins   MELANOMA EXCISION WITH SENTINEL LYMPH NODE BIOPSY  2001   moh's procedure/  RIGHT FOREHEAD AND UPPER EYEBROW   RIGHT LATERAL PAROTIDECTOMY W/ NERVE DISSECTION / RIGHT MODIFIED RADICAL NECK DISSECTION SPARING SCM ELEVENTH NERVE AND INTERNAL JUGULAR VEIN  09-12-2006  DR DWIGHT BATES   DR DWIGHT BATES; "inside gland; lots of lymph nodes"    Social History   Socioeconomic History   Marital status: Married    Spouse name: Not on file   Number of children: Not on file   Years of education: Not on file   Highest education level: Not on file  Occupational History   Occupation: Scientist, water quality at [employer] Use   Smoking status: Never   Smokeless tobacco: Never  Vaping Use   Vaping Use: Never used  Substance and Sexual Activity   Alcohol use: Not Currently    Comment: occasional- 1 drink per month   Drug use: Never   Sexual activity: Not on file  Other Topics Concern   Not on file  Social History Narrative   Not on file   Social Determinants of Health   Financial Resource Strain: Not on file  Food Insecurity: Not on file  Transportation Needs: Not on file  Physical Activity: Not on file  Stress: Not on file  Social Connections: Not on file  Intimate Partner Violence: Not on file    Outpatient Encounter Medications as of 03/31/2022  Medication Sig   atorvastatin (LIPITOR) 40 MG tablet Take 1 tablet (40 mg total) by mouth daily.   calcium carbonate (TUMS - DOSED IN MG ELEMENTAL CALCIUM) 500  MG chewable tablet Chew 1,000 mg by mouth daily as needed for indigestion or heartburn.   carvedilol (COREG) 3.125 MG tablet Take 1 tablet (3.125 mg total) by mouth 2 (two) times daily with a meal.   docusate sodium (COLACE) 100 MG capsule Take 1 capsule (100 mg total) by mouth 2 (two) times daily as needed for mild constipation.   HYDROcodone-acetaminophen (NORCO) 5-325 MG tablet Take 1 tablet by mouth every 6 (six) hours as needed for moderate pain.   lidocaine (LIDODERM) 5 % Place 1 patch onto the skin daily as needed (pain).   lisinopril (ZESTRIL) 2.5 MG tablet Take 1 tablet (2.5 mg total) by mouth every morning.   loratadine-pseudoephedrine (CLARITIN-D 24-HOUR) 10-240 MG 24 hr tablet Take 1 tablet by mouth as needed for allergies.   nitroGLYCERIN (NITROSTAT) 0.4 MG SL tablet Place 1 tablet (0.4 mg total) under the tongue every 5 (five) minutes as needed for chest pain. DISSOLVE ONE TABLET UNDER THE TONGUE EVERY 5 MINUTES AS NEEDED FOR CHEST PAIN.  DO NOT EXCEED A TOTAL OF 3 DOSES IN 15 MINUTES   pantoprazole (PROTONIX) 40 MG tablet Take 40 mg by mouth 2 (two) times daily.   polyethylene glycol (MIRALAX / GLYCOLAX) 17 g packet Take 17 g by mouth daily.   silver sulfADIAZINE (SILVADENE) 1 % cream Apply 1 application topically daily. Apply to shoulder nightly 7-14 days   tamsulosin (FLOMAX) 0.4 MG CAPS capsule Take 1 capsule (0.4 mg total) by mouth daily.   acetaminophen (TYLENOL) 500 MG tablet Take 1,000 mg by mouth every 6 (six) hours as needed for moderate pain. (Patient not taking: Reported on 03/31/2022)   famotidine (PEPCID) 40 MG tablet Take 40 mg by mouth daily. (Patient not taking: Reported on 03/31/2022)   [EXPIRED] ketorolac (TORADOL) 30 MG/ML injection 30 mg    No facility-administered encounter medications on file as of 03/31/2022.    Allergies  Allergen Reactions   Cephalexin Shortness Of Breath and Rash   Lorazepam Shortness Of Breath and Other (See Comments)    She may be  very sensitive to benzo.    Stops breathing    Promethazine     Felt strange, "knocked me out"    Review of Systems  Constitutional:  Negative for activity change, appetite change, chills, diaphoresis, fatigue, fever, unexpected weight change and weight loss.  HENT: Negative.    Eyes: Negative.  Negative for photophobia and visual disturbance.  Respiratory:  Negative for cough, chest tightness and shortness of breath.  Cardiovascular:  Negative for chest pain, palpitations and leg swelling.  Gastrointestinal:  Positive for abdominal pain. Negative for abdominal distention, anal bleeding, blood in stool, bowel incontinence, constipation, diarrhea, nausea, rectal pain and vomiting.  Endocrine: Negative.   Genitourinary:  Positive for flank pain. Negative for bladder incontinence, decreased urine volume, difficulty urinating, dysuria, frequency, hematuria, pelvic pain and urgency.  Musculoskeletal:  Positive for back pain. Negative for arthralgias and myalgias.  Skin: Negative.   Allergic/Immunologic: Negative.   Neurological:  Negative for dizziness, tingling, weakness, numbness, headaches and paresthesias.  Hematological: Negative.   Psychiatric/Behavioral:  Negative for confusion, hallucinations, sleep disturbance and suicidal ideas.   All other systems reviewed and are negative.       Objective:  BP (!) 142/75   Pulse 72   Temp 97.6 F (36.4 C) (Temporal)   Ht '5\' 4"'$  (1.626 m)   Wt 205 lb (93 kg)   LMP 05/18/2011   SpO2 96%   BMI 35.19 kg/m    Wt Readings from Last 3 Encounters:  03/31/22 205 lb (93 kg)  02/08/22 203 lb (92.1 kg)  02/03/22 202 lb (91.6 kg)    Physical Exam Vitals and nursing note reviewed.  Constitutional:      General: She is not in acute distress.    Appearance: Normal appearance. She is obese. She is not ill-appearing, toxic-appearing or diaphoretic.  HENT:     Head: Normocephalic and atraumatic.  Eyes:     Pupils: Pupils are equal, round, and  reactive to light.  Cardiovascular:     Rate and Rhythm: Normal rate and regular rhythm.     Heart sounds: Normal heart sounds. No murmur heard.    No friction rub. No gallop.  Pulmonary:     Effort: Pulmonary effort is normal.     Breath sounds: Normal breath sounds.  Abdominal:     Tenderness: There is no right CVA tenderness or left CVA tenderness.  Musculoskeletal:     Right lower leg: No edema.     Left lower leg: No edema.  Skin:    General: Skin is warm and dry.     Capillary Refill: Capillary refill takes less than 2 seconds.  Neurological:     General: No focal deficit present.     Mental Status: She is alert and oriented to person, place, and time.  Psychiatric:        Mood and Affect: Mood normal.        Behavior: Behavior normal.        Thought Content: Thought content normal.        Judgment: Judgment normal.     Results for orders placed or performed in visit on 03/31/22  Microscopic Examination   Urine  Result Value Ref Range   WBC, UA None seen 0 - 5 /hpf   RBC, Urine 0-2 0 - 2 /hpf   Epithelial Cells (non renal) None seen 0 - 10 /hpf   Renal Epithel, UA None seen None seen /hpf   Bacteria, UA None seen None seen/Few  Urinalysis, Routine w reflex microscopic  Result Value Ref Range   Specific Gravity, UA <1.005 (L) 1.005 - 1.030   pH, UA 6.5 5.0 - 7.5   Color, UA Yellow Yellow   Appearance Ur Clear Clear   Leukocytes,UA Negative Negative   Protein,UA Negative Negative/Trace   Glucose, UA Negative Negative   Ketones, UA Negative Negative   RBC, UA Trace (A) Negative   Bilirubin, UA Negative Negative  Urobilinogen, Ur 0.2 0.2 - 1.0 mg/dL   Nitrite, UA Negative Negative   Microscopic Examination See below:        Pertinent labs & imaging results that were available during my care of the patient were reviewed by me and considered in my medical decision making.  Assessment & Plan:  Daysie was seen today for flank pain.  Diagnoses and all orders  for this visit:  Right lower quadrant abdominal pain Right flank pain History of kidney stones Other microscopic hematuria Ongoing right flank pain with radiation to RLQ with associated hematuria. Was placed on Flomax and Norco 03/06/2022 for same symptoms. Urinalysis not concerning for infection. STAT CT renal study ordered, can not have completed until Monday. Aware of red flags which require emergency room visit. Toradol IM in office. Follow up with urology.  -     Urinalysis, Routine w reflex microscopic -     CT RENAL STONE STUDY; Future -     ketorolac (TORADOL) 30 MG/ML injection 30 mg -     Microscopic Examination     Continue all other maintenance medications.  Follow up plan: Return if symptoms worsen or fail to improve.   Continue healthy lifestyle choices, including diet (rich in fruits, vegetables, and lean proteins, and low in salt and simple carbohydrates) and exercise (at least 30 minutes of moderate physical activity daily).  Educational handout given for flank pain  The above assessment and management plan was discussed with the patient. The patient verbalized understanding of and has agreed to the management plan. Patient is aware to call the clinic if they develop any new symptoms or if symptoms persist or worsen. Patient is aware when to return to the clinic for a follow-up visit. Patient educated on when it is appropriate to go to the emergency department.   Monia Pouch, FNP-C [facility] [phone]

## 2022-04-03 ENCOUNTER — Ambulatory Visit: Payer: BC Managed Care – PPO | Admitting: Family Medicine

## 2022-04-03 ENCOUNTER — Ambulatory Visit
Admission: RE | Admit: 2022-04-03 | Discharge: 2022-04-03 | Disposition: A | Discharge status: Home / Self Care | Payer: BC Managed Care – PPO | Source: Ambulatory Visit | Attending: Family Medicine | Admitting: Family Medicine

## 2022-04-03 DIAGNOSIS — Z87442 Personal history of urinary calculi: Secondary | ICD-10-CM | POA: Diagnosis not present

## 2022-04-03 DIAGNOSIS — R3129 Other microscopic hematuria: Secondary | ICD-10-CM

## 2022-04-03 DIAGNOSIS — R109 Unspecified abdominal pain: Secondary | ICD-10-CM

## 2022-04-03 DIAGNOSIS — K429 Umbilical hernia without obstruction or gangrene: Secondary | ICD-10-CM | POA: Diagnosis not present

## 2022-04-03 DIAGNOSIS — R1031 Right lower quadrant pain: Secondary | ICD-10-CM

## 2022-04-03 DIAGNOSIS — K6389 Other specified diseases of intestine: Secondary | ICD-10-CM | POA: Diagnosis not present

## 2022-04-04 NOTE — Addendum Note (Signed)
Addended by: Baruch Gouty on: 04/04/2022 10:34 AM   Modules accepted: Orders

## 2022-04-04 NOTE — Addendum Note (Signed)
Addended by: Baruch Gouty on: 04/04/2022 03:14 PM   Modules accepted: Orders

## 2022-04-05 ENCOUNTER — Other Ambulatory Visit: Payer: Self-pay

## 2022-04-05 ENCOUNTER — Inpatient Hospital Stay: Payer: BC Managed Care – PPO | Attending: Hematology and Oncology | Admitting: Hematology and Oncology

## 2022-04-05 VITALS — BP 160/83 | HR 82 | Temp 98.1°F | Resp 16 | Ht 64.0 in | Wt 204.3 lb

## 2022-04-05 DIAGNOSIS — C07 Malignant neoplasm of parotid gland: Secondary | ICD-10-CM

## 2022-04-05 DIAGNOSIS — Z85820 Personal history of malignant melanoma of skin: Secondary | ICD-10-CM | POA: Insufficient documentation

## 2022-04-05 DIAGNOSIS — R918 Other nonspecific abnormal finding of lung field: Secondary | ICD-10-CM | POA: Diagnosis not present

## 2022-04-05 DIAGNOSIS — Z85828 Personal history of other malignant neoplasm of skin: Secondary | ICD-10-CM | POA: Diagnosis not present

## 2022-04-05 DIAGNOSIS — R29810 Facial weakness: Secondary | ICD-10-CM | POA: Diagnosis not present

## 2022-04-05 DIAGNOSIS — Z85818 Personal history of malignant neoplasm of other sites of lip, oral cavity, and pharynx: Secondary | ICD-10-CM | POA: Diagnosis not present

## 2022-04-05 NOTE — Progress Notes (Signed)
Initial neurology clinic note  [name] MRN: [id number] DOB: 1961/06/15  Referring provider: Dettinger, Fransisca Kaufmann, MD  Primary care provider: Dettinger, Fransisca Kaufmann, MD  Reason for consult:  headaches  Subjective:  This is Ms. [name], a 61 y.o. 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 The patient is accompanied by husband.  Patient has a headache every day for the last 22 years (after skin cancer surgery). Some days it is worse then others. For the last 4-6 months it has been more concentrated around her right eyebrow/eye. Even touching this area causes a lot of pain. It is a throbbing pain, 7/10. The headache takes about 30 minutes to reach maximal intensity. She has to take Excedrin every day (at least 2 doses per day). This helps for a while. Her headaches last hours. A nap will also help. She denies photophobia or phonophobia. She has a little nausea but is not sure this is related to headaches. Her headaches do not affect her function. She denies focal neurologic deficits before, during, or after headaches, including vision loss. Her headache does not change with position change.  She endorses long standing neck pain.  She mentioned seeing clear diamonds once in her vision prior to a headache last week. This has only happened once though.  She does not remember ever being prescribed a medication for headaches.  Patient also takes Norco for back pain and ?kidney stones.  She drinks 3 cups of coffee and 2 diet sodas per day.  She does not drink EtOH. She does not smoke.  Patient was seen by PCP and ENT. CT of maxillofacial and neck showed no abnormalities. She was prescribed Augmentin for ear infection. This did not change headache symptoms.  MEDICATIONS:  Outpatient  Encounter Medications as of 04/11/2022  Medication Sig   atorvastatin (LIPITOR) 40 MG tablet Take 1 tablet (40 mg total) by mouth daily.   calcium carbonate (TUMS - DOSED IN MG ELEMENTAL CALCIUM) 500 MG chewable tablet Chew 1,000 mg by mouth daily as needed for indigestion or heartburn.   carvedilol (COREG) 3.125 MG tablet Take 1 tablet (3.125 mg total) by mouth 2 (two) times daily with a meal.   docusate sodium (COLACE) 100 MG capsule Take 1 capsule (100 mg total) by mouth 2 (two) times daily as needed for mild constipation.   famotidine (PEPCID) 40 MG tablet Take 40 mg by mouth daily.   HYDROcodone-acetaminophen (NORCO) 5-325 MG tablet Take 1 tablet by mouth every 6 (six) hours as needed for moderate pain.   lidocaine (LIDODERM) 5 % Place 1 patch onto the skin daily as needed (pain).   lisinopril (ZESTRIL) 2.5 MG tablet Take 1 tablet (2.5 mg total) by mouth every morning.   loratadine-pseudoephedrine (CLARITIN-D 24-HOUR) 10-240 MG 24 hr tablet Take 1 tablet by mouth as needed for allergies.   nitroGLYCERIN (NITROSTAT) 0.4 MG SL tablet Place 1 tablet (0.4 mg total) under the tongue every 5 (five) minutes as needed for chest pain. DISSOLVE ONE TABLET UNDER THE TONGUE EVERY 5 MINUTES AS NEEDED FOR CHEST PAIN.  DO NOT EXCEED A TOTAL OF 3 DOSES IN 15 MINUTES   NON FORMULARY headache relief with acetamimophen, aspirin   polyethylene glycol (MIRALAX / GLYCOLAX) 17 g packet Take 17 g by mouth daily.   silver  sulfADIAZINE (SILVADENE) 1 % cream Apply 1 application topically daily. Apply to shoulder nightly 7-14 days   tamsulosin (FLOMAX) 0.4 MG CAPS capsule Take 1 capsule (0.4 mg total) by mouth daily.   acetaminophen (TYLENOL) 500 MG tablet Take 1,000 mg by mouth every 6 (six) hours as needed for moderate pain. (Patient not taking: Reported on 03/31/2022)   pantoprazole (PROTONIX) 40 MG tablet Take 40 mg by mouth 2 (two) times daily. (Patient not taking: Reported on 04/11/2022)   No facility-administered  encounter medications on file as of 04/11/2022.    PAST MEDICAL HISTORY: Past Medical History:  Diagnosis Date   Anxiety    Arthritis    back (12/30/2014)   Asthma    as a child, no problems as an adult, has an albuterol inhaler   Cerebral cyst    per brain MRI 07-12-2019 unchanged 31m cyst inferomedial right frontal lobe   Chronic headaches    Chronic systolic [place]congestive) heart [place]    followed by dr nFrances Nickels  Complication of anesthesia    pt is very sensitive to benzodiazepines, pt stated "almost died"   Compression [place] 05/[place]   per pt L3 and L4   DCM (dilated cardiomyopathy) ([place]    w/ Takatsubo---  followed by dr nJohnsie Cancel--  stress-induced --- cardiac cath 12-30-2014 ef 30-35%, recovered per cardiac MRI 03-15-2015 ef  64%   Depression    Disorder of mastoid    per brain MRI 07-12-2019 persistant large mastoid effusion   Dysuria    Environmental and seasonal allergies    Frequency of urination    GERD (gastroesophageal reflux disease)    History of asthma    childhood   History of esophageal spasm    History of kidney stones    History of melanoma excision 2001   right supraorbital (right forehead and upper eyelid )  s/p  Moh's procedure w/ sln bx,  no recurrence per pt   History of non-ST elevation myocardial infarction (NSTEMI) 12/29/2014   per cath normal coronaries , ef 30-35%---  stress-- induced Takotsubo syndrome    History of palpitations 01/05/2015   STRESS INDUCED   History of parotid cancer 2008   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-2006 s/p  right lateral parotidectomy w/ nerve dissection , modified radical neck dissection ;  completed  x35 fractions raditation 2008;  no recurrence per pt   Hyperlipidemia    Hypertension    Kidney cysts    Left ureteral calculus    Low back pain    [place]   face   Muscle spasm of back    [place]ocardial infarction ([place]   Pneumonia     as a child   PONV (postoperative nausea and vomiting)    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[place]   right side   Squamous cell carcinoma of skin 02/22/2021   in istu- right forehead ([id number])   Takotsubo syndrome 12/29/2014   cardiologist---  dr nFrances Nickels-- dx NSTEMI -- stress-induced w/ DCM--  per cath 12-30-2014 normal coronaries , ef 30-35%,  recovered ef per cardiac MRI  ef 64%   Urgency of urination    Wears glasses     PAST SURGICAL HISTORY: Past Surgical History:  Procedure Laterality Date   BACK SURGERY     BREAST BIOPSY Bilateral early 2000's   CARDIAC [place]HETERIZATION  [place] (APPROX)  [place]  NORMAL   CARDIAC CATHETERIZATION  12/30/2014   CARDIAC  CATHETERIZATION N/A 12/30/2014   Procedure: Left Heart Cath and Coronary Angiography;  Surgeon: Wellington Hampshire, MD;  Location: [facility];  Service: Cardiovascular;  Laterality: N/A;   CARDIOVASCULAR STRESS TEST  03-21-2012  DR Johnsie Cancel   NORMAL NUCLEAR STUDY/  NO ISCHEMIA/  EF 63%   COLONOSCOPY  10/2020   COLONOSCOPY WITH PROPOFOL  05/29/2017   CYSTOSCOPY W/ URETERAL STENT PLACEMENT Left 03/26/2013   Procedure: CYSTOSCOPY WITH RETROGRADE PYELOGRAM ;  Surgeon: Molli Hazard, MD;  Location[facility];  Service: Urology;  Laterality: Left;   CYSTOSCOPY WITH RETROGRADE PYELOGRAM, URETEROSCOPY AND STENT PLACEMENT Bilateral 03/19/2013   Procedure: CYSTOSCOPY WITH RETROGRADE PYELOGRAM, BILATERAL URETEROSCOPY AND STENT PLACEMENT LEFT URETER,BILATERAL STONE EXTRACTION , HOLMIUM LASER LEFT URETER;  Surgeon: Molli Hazard, MD;  Location: [facility];  Service: Urology;  Laterality: Bilateral;   CYSTOSCOPY WITH STENT PLACEMENT Left 03/26/2013   Procedure: CYSTOSCOPY WITH STENT PLACEMENT;  Surgeon: Molli Hazard, MD;  Location: [facility];  Service: Urology;  Laterality: Left;   CYSTOSCOPY/URETEROSCOPY/HOLMIUM LASER/STENT PLACEMENT Left 07/07/2020   Procedure: CYSTOSCOPY LEFT  URETEROSCOPY/HOLMIUM LASER/STENT PLACEMENT;  Surgeon: Lucas Mallow, MD;  Location: [facility];  Service: Urology;  Laterality: Left;   DIAGNOSTIC LAPAROSCOPY  04/12/2009   ESOPHAGOGASTRODUODENOSCOPY (EGD) WITH PROPOFOL N/A 02/10/2016   Procedure: ESOPHAGOGASTRODUODENOSCOPY (EGD) WITH PROPOFOL;  Surgeon: Ronald Lobo, MD;  Location: [facility];  Service: Endoscopy;  Laterality: N/A;   KIDNEY SURGERY  1966   BILATERAL URETER'S DILATATION   LUMBAR LAMINECTOMY/DECOMPRESSION MICRODISCECTOMY  05/18/2011   Procedure: LUMBAR LAMINECTOMY/DECOMPRESSION MICRODISCECTOMY;  Surgeon: Johnn Hai;  Location: [facility];  Service: Orthopedics;  Laterality: Right;  Decompression Lumbar 4-Lumbar 5  Right    (xray)    LUMBAR LAMINECTOMY/DECOMPRESSION MICRODISCECTOMY N/A 05/06/2021   Procedure: Microlumbar decompression L5-S1;  Surgeon: Susa Day, MD;  Location: [place];  Service: Orthopedics;  Laterality: N/A;  3 C-bed 90 mins   MELANOMA EXCISION WITH SENTINEL LYMPH NODE BIOPSY  2001   moh's procedure/  RIGHT FOREHEAD AND UPPER EYEBROW   RIGHT LATERAL PAROTIDECTOMY W/ NERVE DISSECTION / RIGHT MODIFIED RADICAL NECK DISSECTION SPARING SCM ELEVENTH NERVE AND INTERNAL JUGULAR VEIN  09-12-2006  DR DWIGHT BATES   DR DWIGHT BATES; "inside gland; lots of lymph nodes"    ALLERGIES: Allergies  Allergen Reactions   Cephalexin Shortness Of Breath and Rash   Lorazepam Shortness Of Breath and Other (See Comments)    She may be very sensitive to benzo.    Stops breathing    Promethazine     Felt strange, "knocked me out"   Seasonal Ic [Octacosanol] Other (See Comments)    FAMILY HISTORY: Family History  Problem Relation Age of Onset   Hypertension Mother    COPD Mother    Breast cancer Mother        breast   Dementia Mother    Heart disease Father    Cancer Father        Colorectal   Hyperlipidemia Father    Hypertension Father    Colon cancer Sister     SOCIAL HISTORY: Social  History   Tobacco Use   Smoking status: Never   Smokeless tobacco: Never  Vaping Use   Vaping Use: Never used  Substance Use Topics   Alcohol use: Not Currently    Comment: occasional- 1 drink per month   Drug use: Never   Social History   Social History Narrative   Right handed  Caffeine 3 cups daily   Lives one story home with basement 1 time weekly    Objective:  Vital Signs:  BP (!) 150/81   Pulse 74   Ht '5\' 4"'$  (1.626 m)   Wt 204 lb (92.5 kg)   LMP 05/18/2011   SpO2 96%   BMI 35.02 kg/m   General: No acute distress.  Patient appears well-groomed.   Head:  Normocephalic/atraumatic. Defect above and below right eyebrow (previous surgery for skin cancer removal) Eyes:  fundi examined, discs margins crisp Neck: supple, full range of motion  Neurological Exam: Mental status: alert and oriented, speech fluent and not dysarthric, language intact.  Cranial nerves: CN I: not tested CN II: pupils equal, round and reactive to light, visual fields intact CN III, IV, VI:  full range of motion, no nystagmus, no ptosis CN V: facial sensation intact. CN VII: upper and lower face symmetric CN VIII: hearing intact CN IX, X: gag intact, uvula midline CN XI: sternocleidomastoid and trapezius muscles intact CN XII: tongue midline  Bulk & Tone: normal, no fasciculations. Motor:  muscle strength 5/5 throughout Deep Tendon Reflexes:  2+ throughout  Sensation:  Sensation intact in all extremities. Finger to nose testing:  Without dysmetria.   Gait:  Normal station, antalgic gait. Some difficulty with tandem walking. Able to walk on toes and heels.  Romberg negative.   Labs and Imaging review: Internal labs: Lab Results  Component Value Date   HGBA1C 5.5 01/03/2017   No results found for: "VITAMINB12" Lab Results  Component Value Date   TSH 1.250 02/03/2022   No results found for: "ESRSEDRATE", "POCTSEDRATE"  Normal or unremarkable: CMP CBC significant for elevated  MCV (99)  Imaging: CT maxillofacial wo contrast (01/30/22): FINDINGS: Paranasal sinuses:   Frontal: Normally aerated. Patent frontal sinus drainage pathways.   Ethmoid: Minor mucosal thickening.   Maxillary: Minor mucosal thickening.   Sphenoid: Normally aerated. Patent sphenoethmoidal recesses.   Right ostiomeatal unit: Patent.   Left ostiomeatal unit: Patent.   Nasal passages: Patent. Intact nasal septum is mildly deviated to the right.   Anatomy: Cribriform plate and fovea ethmoidalis are intact. The fovea ethmoidalis is slightly higher on the left. Sellar sphenoid pneumatization pattern. No dehiscence of carotid or optic canals. No onodi cell.   Other: Resection of right parotid and submandibular glands with additional soft tissue thickening presumably reflecting sequelae of neck dissection. Similar appearance to prior neck CT.   IMPRESSION: No significant paranasal sinus inflammatory changes at the time of imaging. Drainage pathways are patent. Mild rightward deviation of the nasal septum.  CT soft tissue neck w contrast (08/08/21): FINDINGS: Pharynx and larynx: Streak and beam hardening artifact arising from dental restoration partially obscures the oral cavity. No appreciable swelling or discrete mass within the oral cavity, pharynx or larynx.   Salivary glands: Redemonstrated sequela of prior right parotid and submandibular gland resection, and presumed right neck is dissection (with unchanged scarring at these sites).Unremarkable appearance of the left parotid and submandibular glands.   Thyroid: Atrophy of the right lobe. Otherwise unremarkable.   Lymph nodes: No pathologically enlarged cervical chain lymph nodes identified.   Vascular: The major vascular structures of the neck are patent. Atherosclerotic plaque within the visualized aortic arch, proximal major branch vessels of the neck and carotid arteries.   Limited intracranial: No appreciable  acute intracranial abnormality within the field of view.   Visualized orbits: Incompletely imaged. No orbital mass or acute orbital finding at the imaged levels.  Mastoids and visualized paranasal sinuses: Small mucous retention cyst within the left maxillary sinus. Chronic right mastoid effusion.   Skeleton: Straightening of the expected cervical lordosis. Cervical spondylosis. No acute bony abnormality or aggressive osseous lesion.   Upper chest: No consolidation within the imaged lung apices.   IMPRESSION: Redemonstrated sequela of prior right parotid and submandibular gland resection, and presumed right neck dissection. No evidence of residual/recurrent tumor.   No pathologically enlarged cervical chain lymph nodes.   Small left maxillary sinus mucous retention cyst.   Chronic right mastoid effusion.   Cervical spondylosis.   Aortic Atherosclerosis (ICD10-I70.0).  CT renal stone study (04/03/22): FINDINGS: Lower chest: In image 20 of series 5, there is a 1 cm nodular density in lingula which was not evident in the previous study. There is no focal consolidation in the lower lung fields. There is no pleural effusion.   Hepatobiliary: No focal abnormalities are seen in liver. There is no dilation of bile ducts.   Pancreas: No focal abnormalities are seen.   Spleen: Unremarkable.   Adrenals/Urinary Tract: Adrenals are unremarkable. There is no hydronephrosis. There are no renal stones. Previously seen left renal stones are not evident in the current study. There are no calcific densities in the courses of both ureters. Urinary bladder is not distended.   Stomach/Bowel: Stomach is unremarkable. Possible oral medication is seen in the lumen of stomach. Small bowel loops are not dilated. Appendix is difficult to visualize. There are no secondary signs of appendicitis in the right lower quadrant. There is no wall thickening in colon. There is no pericolic  stranding. There is incomplete distention of sigmoid colon.   Vascular/Lymphatic: Scattered arterial calcifications are seen.   Reproductive: Unremarkable.   Other: There is no ascites or pneumoperitoneum. Small umbilical hernia containing fat is seen.   Musculoskeletal: There is 30% decrease in height of body of L3 vertebra. Schmorl's node is seen in the upper endplate of body of L3 vertebra. There is 50% decrease in height of body of L4 vertebra. These findings were not seen in the previous examination.   IMPRESSION: There is no evidence of intestinal obstruction or pneumoperitoneum. There is no hydronephrosis.   There is a new 1 cm noncalcified nodular density in the lingula and left lower lung field. Consider one of the following in 3 months for both low-risk and high-risk individuals: (a) repeat chest CT, (b) follow-up PET-CT, or (c) tissue sampling. This recommendation follows the consensus statement: Guidelines for Management of Incidental Pulmonary Nodules Detected on CT Images: From the Fleischner Society 2017; Radiology 2017; 284:228-243.   There is interval decrease in height of bodies of L3 and L4 vertebrae since 04/21/2016 suggesting fractures of indeterminate age. Please correlate with clinical symptoms and physical examination findings.   Assessment/Plan:  [name] is a 61 y.o. female who presents for evaluation of headaches. 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 Her neurological examination is essentially normal today. Available diagnostic data is significant for mildly elevated MCV on CBC. Patient's symptoms are consistent with chronic daily headaches, perhaps with migraine features. There is also a component of medication overuse as well. I will start her on a preventative medication for headaches in hopes of  reducing the need for daily excedrin.  PLAN: -Blood work: B12, folate -Nortriptyline 10 mg qhs for 1 week then increase to  20 mg qhs if no side effects.  -EKG from 02/08/22 reviewed. No cardiac arrhthymias or long QT -Limit use of pain relievers to no more than 2 days out of week to prevent risk of rebound or medication-overuse headache as able. -Limit caffeine as able   -Return to clinic in 3 months  The impression above as well as the plan as outlined below were extensively discussed with the patient (in the company of husband) who voiced understanding. All questions were answered to their satisfaction.  When available, results of the above investigations and possible further recommendations will be communicated to the patient via telephone/MyChart. Patient to call office if not contacted after expected testing turnaround time.   Total time spent reviewing records, interview, history/exam, documentation, and coordination of care on day of encounter:  45 min   Thank you for allowing me to participate in patient's care.  If I can answer any additional questions, I would be pleased to do so.  Kai Levins, MD   CC: Dettinger, Fransisca Kaufmann, MD [place] [id number]  CC: Referring provider: Dettinger, Fransisca Kaufmann, MD [address] [place],  [place] [id number]

## 2022-04-05 NOTE — Progress Notes (Signed)
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 She apparently requested this follow-up since she continues to feel poorly.  For the past few weeks she has been following up with her PCP for concerns of urinary tract infection/kidney stones.  She recently had some CT imaging which showed a lung nodule, noncalcified measuring about a centimeter.  Recommendation was to consider repeat CT imaging versus a PET versus biopsy.  I have discussed these options today and patient tells me that she would prefer doing a PET/CT. I think this is certainly reasonable given her history of sarcoma and melanoma.  I have sent an in basket message to her primary team as well if they can kindly order the PET/CT. I have scheduled a telephone visit in a few weeks to review the PET/CT results At this time she can follow-up with medical oncology as needed or based on the PET/CT recommendations.  HISTORY OF PRESENTING ILLNESS:  [name] 61 y.o. female is here because of history of malignant myoepithelioma of right parotid gland  2001, melanoma of right eyebrow, mohr's surgery in [place]. She didn't get any adjuvant therapy.  She doesn't believe there is LN involvement. Tammy [name] was the surgeon. 2008, right parotid malignant myoepithelioma, treated with surgery and radiation by Tammy [name]. [name] is the surgeon. She now has noted caving of the right face for the past 6 months, getting worse, asymmetry. She has also noticed spontaneous bruising of the right eyelid a couple days ago. NO speech changes, swallowing changes, pulling in the cheek feeling is present. No difficulty breathing. No changes in bowel habits or trouble urinating. She has history of kidney stones, no UTI, had previous procedures. History of Takotsubo cardiomyopathy, resolved.  She has been going to her dentist who has been examining her but did not feel like these changes were significant.  She last saw Tammy. Redmond Vazquez early 2021.  She has not seen Tammy. Tammi Vazquez in a few years.  Interval history She continues to complaining of not feeling well.  The facial asymmetry has not changed at all.  She tells me that past few weeks have been horrible, she has burning urination and some back pain and was initially thought to have kidney stones but her CT imaging did not show any kidney stones.  She is also following up with urology.  She was found to have a couple lung nodules as an incidental finding.  She has no respiratory symptoms. Rest of the pertinent 10 point ROS reviewed and negative.  MEDICAL HISTORY:  Past Medical History:  Diagnosis Date   Anxiety    Arthritis    back (12/30/2014)   Asthma    as a child, no problems as an adult, has an albuterol inhaler  Cerebral cyst    per brain MRI 07-12-2019 unchanged 52m cyst inferomedial right frontal lobe   Chronic headaches    Chronic systolic (congestive) heart failure ([place]    followed by Tammy nFrances Nickels  Complication of anesthesia    pt is very sensitive to benzodiazepines, pt stated "almost died"   Compression fracture of lumbar vertebra ([place] 05/27/2020   per pt L3 and L4   DCM (dilated cardiomyopathy)  ([facility]    w/ TVirgie Dad--  followed by Tammy nJohnsie Cancel--  stress-induced --- cardiac cath 12-30-2014 ef 30-35%, recovered per cardiac MRI 03-15-2015 ef  64%   Depression    Disorder of mastoid    per brain MRI 07-12-2019 persistant large mastoid effusion   Dysuria    Environmental and seasonal allergies    Frequency of urination    GERD (gastroesophageal reflux disease)    History of asthma    childhood   History of esophageal spasm    History of kidney stones    History of melanoma excision 2001   right supraorbital (right forehead and upper eyelid )  s/p  Moh's procedure w/ sln bx,  no recurrence per pt   History of non-ST elevation myocardial infarction (NSTEMI) 12/29/2014   per cath normal coronaries , ef 30-35%---  stress-- induced Takotsubo syndrome    History of palpitations 01/05/2015   STRESS INDUCED   History of parotid cancer 2008   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-2006 s/p  right lateral parotidectomy w/ nerve dissection , modified radical neck dissection ;  completed  x35 fractions raditation 2008;  no recurrence per pt   Hyperlipidemia    Hypertension    Kidney cysts    Left ureteral calculus    Low back pain    Melanoma ([place]   face   Muscle spasm of back    Myocardial infarction ([place]   Pneumonia    as a child   PONV (postoperative nausea and vomiting)    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   Squamous cell carcinoma of skin 02/22/2021   in [place]- right forehead ([id number])   Takotsubo syndrome 12/29/2014   cardiologist---  Tammy nFrances Nickels-- dx NSTEMI -- stress-induced w/ DCM--  per cath 12-30-2014 normal coronaries , ef 30-35%,  recovered ef per cardiac MRI  ef 64%   Urgency of urination    Wears glasses     SURGICAL HISTORY: Past Surgical History:  Procedure Laterality Date   BACK SURGERY     BREAST BIOPSY Bilateral early 2000's   CARDIAC CATHETERIZATION  2006 (APPROX)  [place]   NORMAL   CARDIAC  CATHETERIZATION  12/30/2014   CARDIAC CATHETERIZATION N/A 12/30/2014   Procedure: Left Heart Cath and Coronary Angiography;  Surgeon: MWellington Hampshire MD;  [facility];  Service: Cardiovascular;  Laterality: N/A;   CARDIOVASCULAR STRESS TEST  03-21-2012  Tammy NJohnsie Cancel  NORMAL NUCLEAR STUDY/  NO ISCHEMIA/  EF 63%   COLONOSCOPY  10/2020   COLONOSCOPY WITH PROPOFOL  05/29/2017   CYSTOSCOPY W/ URETERAL STENT PLACEMENT Left 03/26/2013   Procedure: CYSTOSCOPY WITH RETROGRADE PYELOGRAM ;  Surgeon: DMolli Hazard MD;  [facility]  Service: Urology;  Laterality: Left;   CYSTOSCOPY WITH RETROGRADE PYELOGRAM, URETEROSCOPY AND STENT PLACEMENT Bilateral 03/19/2013   Procedure: CYSTOSCOPY WITH RETROGRADE PYELOGRAM, BILATERAL URETEROSCOPY AND STENT PLACEMENT LEFT URETER,BILATERAL STONE EXTRACTION , HOLMIUM LASER LEFT URETER;  Surgeon: DMolli Hazard MD;  Location: [facility];  Service: Urology;  Laterality: Bilateral;   CYSTOSCOPY WITH STENT PLACEMENT Left 03/26/2013   Procedure: CYSTOSCOPY WITH STENT PLACEMENT;  Surgeon: Molli Hazard, MD;  [facility];  Service: Urology;  Laterality: Left;   CYSTOSCOPY/URETEROSCOPY/HOLMIUM LASER/STENT PLACEMENT Left 07/07/2020   Procedure: CYSTOSCOPY LEFT URETEROSCOPY/HOLMIUM LASER/STENT PLACEMENT;  Surgeon: Lucas Mallow, MD;  Location: [facility];  Service: Urology;  Laterality: Left;   DIAGNOSTIC LAPAROSCOPY  04/12/2009   ESOPHAGOGASTRODUODENOSCOPY (EGD) WITH PROPOFOL N/A 02/10/2016   Procedure: ESOPHAGOGASTRODUODENOSCOPY (EGD) WITH PROPOFOL;  Surgeon: Ronald Lobo, MD;  Location[facility];  Service: Endoscopy;  Laterality: N/A;   KIDNEY SURGERY  1966   BILATERAL URETER'S DILATATION   LUMBAR LAMINECTOMY/DECOMPRESSION MICRODISCECTOMY  05/18/2011   Procedure: LUMBAR LAMINECTOMY/DECOMPRESSION MICRODISCECTOMY;  Surgeon: Johnn Hai;  Location: [facility];  Service:  Orthopedics;  Laterality: Right;  Decompression Lumbar 4-Lumbar 5  Right    (xray)    LUMBAR LAMINECTOMY/DECOMPRESSION MICRODISCECTOMY N/A 05/06/2021   Procedure: Microlumbar decompression L5-S1;  Surgeon: Susa Day, MD;  Location: [facility];  Service: Orthopedics;  Laterality: N/A;  3 C-bed 90 mins   MELANOMA EXCISION WITH SENTINEL LYMPH NODE BIOPSY  2001   moh's procedure/  RIGHT FOREHEAD AND UPPER EYEBROW   RIGHT LATERAL PAROTIDECTOMY W/ NERVE DISSECTION / RIGHT MODIFIED RADICAL NECK DISSECTION SPARING SCM ELEVENTH NERVE AND INTERNAL JUGULAR VEIN  09-12-2006  Tammy DWIGHT BATES   Tammy DWIGHT BATES; "inside gland; lots of lymph nodes"    SOCIAL HISTORY: Social History   Socioeconomic History   Marital status: Married    Spouse name: Not on file   Number of children: Not on file   Years of education: Not on file   Highest education level: Not on file  Occupational History   Occupation: Scientist, water quality at [employer] Use   Smoking status: Never   Smokeless tobacco: Never  Vaping Use   Vaping Use: Never used  Substance and Sexual Activity   Alcohol use: Not Currently    Comment: occasional- 1 drink per month   Drug use: Never   Sexual activity: Not on file  Other Topics Concern   Not on file  Social History Narrative   Not on file   Social Determinants of Health   Financial Resource Strain: Not on file  Food Insecurity: Not on file  Transportation Needs: Not on file  Physical Activity: Not on file  Stress: Not on file  Social Connections: Not on file  Intimate Partner Violence: Not on file    FAMILY HISTORY: Family History  Problem Relation Age of Onset   Hypertension Mother    COPD Mother    Cancer Mother        breast   Dementia Mother    Heart disease Father    Cancer Father        Colorectal   Hyperlipidemia Father    Hypertension Father     ALLERGIES:  is allergic to cephalexin, lorazepam, and promethazine.  MEDICATIONS:  Current Outpatient Medications   Medication Sig Dispense Refill   acetaminophen (TYLENOL) 500 MG tablet Take 1,000 mg by mouth every 6 (six) hours as needed for moderate pain. (Patient not taking: Reported on 03/31/2022)     atorvastatin (LIPITOR) 40 MG tablet Take 1 tablet (40 mg total) by mouth daily. 90 tablet 3   calcium carbonate (TUMS - DOSED IN MG ELEMENTAL CALCIUM) 500 MG chewable tablet Chew 1,000 mg by mouth daily as needed for  indigestion or heartburn.     carvedilol (COREG) 3.125 MG tablet Take 1 tablet (3.125 mg total) by mouth 2 (two) times daily with a meal. 180 tablet 3   docusate sodium (COLACE) 100 MG capsule Take 1 capsule (100 mg total) by mouth 2 (two) times daily as needed for mild constipation. 30 capsule 1   famotidine (PEPCID) 40 MG tablet Take 40 mg by mouth daily. (Patient not taking: Reported on 03/31/2022)     HYDROcodone-acetaminophen (NORCO) 5-325 MG tablet Take 1 tablet by mouth every 6 (six) hours as needed for moderate pain. 30 tablet 0   lidocaine (LIDODERM) 5 % Place 1 patch onto the skin daily as needed (pain).     lisinopril (ZESTRIL) 2.5 MG tablet Take 1 tablet (2.5 mg total) by mouth every morning. 90 tablet 3   loratadine-pseudoephedrine (CLARITIN-D 24-HOUR) 10-240 MG 24 hr tablet Take 1 tablet by mouth as needed for allergies.     nitroGLYCERIN (NITROSTAT) 0.4 MG SL tablet Place 1 tablet (0.4 mg total) under the tongue every 5 (five) minutes as needed for chest pain. DISSOLVE ONE TABLET UNDER THE TONGUE EVERY 5 MINUTES AS NEEDED FOR CHEST PAIN.  DO NOT EXCEED A TOTAL OF 3 DOSES IN 15 MINUTES 25 tablet 3   pantoprazole (PROTONIX) 40 MG tablet Take 40 mg by mouth 2 (two) times daily.     polyethylene glycol (MIRALAX / GLYCOLAX) 17 g packet Take 17 g by mouth daily. 100 each 1   silver sulfADIAZINE (SILVADENE) 1 % cream Apply 1 application topically daily. Apply to shoulder nightly 7-14 days 50 g 0   tamsulosin (FLOMAX) 0.4 MG CAPS capsule Take 1 capsule (0.4 mg total) by mouth daily. 30  capsule 3   No current facility-administered medications for this visit.     PHYSICAL EXAMINATION: ECOG PERFORMANCE STATUS: 0 - Asymptomatic  Vitals:   04/05/22 1343  BP: (!) 160/83  Pulse: 82  Resp: 16  Temp: 98.1 F (36.7 C)  SpO2: 96%   Filed Weights   04/05/22 1343  Weight: 204 lb 4.8 oz (92.7 kg)   Physical exam deferred in lieu of counseling.     LABORATORY DATA:  I have reviewed the data as listed Lab Results  Component Value Date   WBC 3.1 (L) 02/03/2022   HGB 12.8 02/03/2022   HCT 38.8 02/03/2022   MCV 99 (H) 02/03/2022   PLT 158 02/03/2022     Chemistry      Component Value Date/Time   NA 145 (H) 02/03/2022 1152   K 4.2 02/03/2022 1152   CL 106 02/03/2022 1152   CO2 24 02/03/2022 1152   BUN 17 02/03/2022 1152   CREATININE 0.93 02/03/2022 1152      Component Value Date/Time   CALCIUM 8.9 02/03/2022 1152   ALKPHOS 59 02/03/2022 1152   AST 29 02/03/2022 1152   ALT 30 02/03/2022 1152   BILITOT <0.2 02/03/2022 1152       RADIOGRAPHIC STUDIES: I have personally reviewed the radiological images as listed and agreed with the findings in the report. CT RENAL STONE STUDY  Result Date: 04/03/2022 CLINICAL DATA:  Right flank pain, history of renal stones EXAM: CT ABDOMEN AND PELVIS WITHOUT CONTRAST TECHNIQUE: Multidetector CT imaging of the abdomen and pelvis was performed following the standard protocol without IV contrast. RADIATION DOSE REDUCTION: This exam was performed according to the departmental dose-optimization program which includes automated exposure control, adjustment of the mA and/or kV according to patient size and/or  use of iterative reconstruction technique. COMPARISON:  04/21/2016 FINDINGS: Lower chest: In image 20 of series 5, there is a 1 cm nodular density in lingula which was not evident in the previous study. There is no focal consolidation in the lower lung fields. There is no pleural effusion. Hepatobiliary: No focal abnormalities  are seen in liver. There is no dilation of bile ducts. Pancreas: No focal abnormalities are seen. Spleen: Unremarkable. Adrenals/Urinary Tract: Adrenals are unremarkable. There is no hydronephrosis. There are no renal stones. Previously seen left renal stones are not evident in the current study. There are no calcific densities in the courses of both ureters. Urinary bladder is not distended. Stomach/Bowel: Stomach is unremarkable. Possible oral medication is seen in the lumen of stomach. Small bowel loops are not dilated. Appendix is difficult to visualize. There are no secondary signs of appendicitis in the right lower quadrant. There is no wall thickening in colon. There is no pericolic stranding. There is incomplete distention of sigmoid colon. Vascular/Lymphatic: Scattered arterial calcifications are seen. Reproductive: Unremarkable. Other: There is no ascites or pneumoperitoneum. Small umbilical hernia containing fat is seen. Musculoskeletal: There is 30% decrease in height of body of L3 vertebra. Schmorl's node is seen in the upper endplate of body of L3 vertebra. There is 50% decrease in height of body of L4 vertebra. These findings were not seen in the previous examination. IMPRESSION: There is no evidence of intestinal obstruction or pneumoperitoneum. There is no hydronephrosis. There is a new 1 cm noncalcified nodular density in the lingula and left lower lung field. Consider one of the following in 3 months for both low-risk and high-risk individuals: (a) repeat chest CT, (b) follow-up PET-CT, or (c) tissue sampling. This recommendation follows the consensus statement: Guidelines for Management of Incidental Pulmonary Nodules Detected on CT Images: From the Fleischner Society 2017; Radiology 2017; 284:228-243. There is interval decrease in height of bodies of L3 and L4 vertebrae since 04/21/2016 suggesting fractures of indeterminate age. Please correlate with clinical symptoms and physical examination  findings. Electronically Signed   By: Elmer Picker M.D.   On: 04/03/2022 11:07   DG ESOPHAGUS W DOUBLE CM (HD)  Addendum Date: 03/08/2022   ADDENDUM REPORT: 03/08/2022 11:39 ADDENDUM: The occurrence of aspiration was communicated to the office of Tammy. Redmond Vazquez by Mady Gemma, PA-C by telephone at 11:09 a.m. on 03/08/2022. Electronically Signed   By: Kellie Simmering D.O.   On: 03/08/2022 11:39   Result Date: 03/08/2022 CLINICAL DATA:  Provided history: Throat pain. Additional history provided: Patient reports difficulty swallowing and painful swallowing, coughing while eating and drinking, heartburn. Patient reports a history of prior radiation therapy. Patient also reports feeling as though something is stuck in her throat. EXAM: ESOPHAGUS/BARIUM SWALLOW/TABLET STUDY TECHNIQUE: A combined double and single contrast examination was performed using effervescent crystals, high-density barium, and thin liquid barium. The exam was performed by Reatha Armour, PA-C, and was supervised and interpreted by Kellie Simmering, DO. FLUOROSCOPY: Fluoroscopy time: 2 minutes (7.10 mGy). COMPARISON:  Neck CT 08/08/2021. Esophagram 12/01/2019. FINDINGS: Fluoroscopic evaluation demonstrates normal caliber and smooth contour of the esophagus. No evidence of fixed stricture, mass or mucosal abnormality. Moderate intermittent esophageal dysmotility with tertiary contractions. No appreciable hiatal hernia. No gastroesophageal reflux was observed. Toward the end of the examination, the patient aspirated a small amount of barium contrast which extended to the level of the lower cervical trachea. The examination was terminated and a chest radiograph was obtained. The chest radiograph revealed no evidence of  aspiration into the bronchial tree. A 13 mm barium tablet was not administered. IMPRESSION: Toward the end of the examination, the patient aspirated a small amount of barium contrast which extended to the level of the lower cervical  trachea. The examination was terminated and a chest radiograph was obtained. The chest radiograph revealed no evidence of aspiration into the bronchial tree. A speech pathology modified barium swallow examination is recommended further evaluation. Moderate intermittent esophageal dysmotility. Due to the observed aspiration, a 13 mm barium tablet was not administered. Within this limitation, no esophageal stricture was identified. Electronically Signed: By: Kellie Simmering D.O. On: 03/08/2022 11:06    All questions were answered. The patient knows to call the clinic with any problems, questions or concerns. I spent 30 minutes in the care of this patient including H and P, review of records, counseling and coordination of care.     Tammy Pike, MD 04/05/2022 4:41 PM

## 2022-04-06 ENCOUNTER — Other Ambulatory Visit: Payer: Self-pay | Admitting: Hematology and Oncology

## 2022-04-07 DIAGNOSIS — M545 Low back pain, unspecified: Secondary | ICD-10-CM | POA: Diagnosis not present

## 2022-04-07 DIAGNOSIS — M5136 Other intervertebral disc degeneration, lumbar region: Secondary | ICD-10-CM | POA: Diagnosis not present

## 2022-04-10 ENCOUNTER — Telehealth: Payer: Self-pay | Admitting: Family Medicine

## 2022-04-10 ENCOUNTER — Telehealth: Payer: Self-pay | Admitting: *Deleted

## 2022-04-10 NOTE — Telephone Encounter (Signed)
Lmtcb.

## 2022-04-10 NOTE — Telephone Encounter (Signed)
Pt called with concerns of PET scan scheduling being done. Advise pt that per Dr.Iruku notes, she would reach out to her PCP for imaging orders of PET/CT. Pt was disgruntled and voiced her disappointment with communication of PCP and oncologist. This nurse called pt PCP to F/U on imagining orders. Spoke with [place] and she stated she would send message to provider.

## 2022-04-10 NOTE — Telephone Encounter (Signed)
I would have to see her to order the PET scan, she did just see her oncologist, she should question them about the PET scan because they are the most likely wants to order it but if she does want me to order it I would have to see her.

## 2022-04-11 ENCOUNTER — Other Ambulatory Visit (INDEPENDENT_AMBULATORY_CARE_PROVIDER_SITE_OTHER): Payer: BC Managed Care – PPO

## 2022-04-11 ENCOUNTER — Ambulatory Visit: Payer: BC Managed Care – PPO | Admitting: Neurology

## 2022-04-11 ENCOUNTER — Encounter: Payer: Self-pay | Admitting: Neurology

## 2022-04-11 ENCOUNTER — Other Ambulatory Visit: Payer: Self-pay | Admitting: Hematology and Oncology

## 2022-04-11 VITALS — BP 150/81 | HR 74 | Ht 64.0 in | Wt 204.0 lb

## 2022-04-11 DIAGNOSIS — G8929 Other chronic pain: Secondary | ICD-10-CM | POA: Diagnosis not present

## 2022-04-11 DIAGNOSIS — R519 Headache, unspecified: Secondary | ICD-10-CM | POA: Diagnosis not present

## 2022-04-11 DIAGNOSIS — G43709 Chronic migraine without aura, not intractable, without status migrainosus: Secondary | ICD-10-CM | POA: Diagnosis not present

## 2022-04-11 DIAGNOSIS — C07 Malignant neoplasm of parotid gland: Secondary | ICD-10-CM

## 2022-04-11 DIAGNOSIS — R918 Other nonspecific abnormal finding of lung field: Secondary | ICD-10-CM

## 2022-04-11 DIAGNOSIS — K769 Liver disease, unspecified: Secondary | ICD-10-CM

## 2022-04-11 LAB — FOLATE: Folate: 13.2 ng/mL (ref >5.9)

## 2022-04-11 LAB — VITAMIN B12: Vitamin B-12: 187 pg/mL — ABNORMAL LOW (ref 211–911)

## 2022-04-11 MED ORDER — NORTRIPTYLINE HCL 10 MG PO CAPS: 20.0000 mg | ORAL_CAPSULE | Freq: Every day | ORAL | 3 refills | Status: DC

## 2022-04-11 NOTE — Telephone Encounter (Signed)
Patient return call Pt asked to leave a message if no answer.

## 2022-04-11 NOTE — Patient Instructions (Signed)
Start Nortriptyline 10 mg at bedtime for 1 week, then if no side effects increase to 20 mg at bedtime thereafter.  Contact us in 4 weeks with update and we can increase dose if needed. Take Excedrin at earliest onset of headache.  May repeat dose once in 2 hours if needed.  Maximum 2 tablets in 24 hours. Limit use of pain relievers to no more than 2 days out of the week.  These medications include acetaminophen, NSAIDs (ibuprofen/Advil/Motrin, naproxen/Aleve, triptans (Imitrex/sumatriptan), Excedrin, and narcotics.  This will help reduce risk of rebound headaches. Be aware of common food triggers:  - Caffeine:  coffee, black tea, cola, Mt. Dew  - Chocolate  - Dairy:  aged cheeses (brie, blue, cheddar, gouda, [place], provolone, [place], Swiss, etc), chocolate milk, buttermilk, sour cream, limit eggs and yogurt  - Nuts, peanut butter  - Alcohol  - Cereals/grains:  FRESH breads (fresh bagels, sourdough, doughnuts), yeast productions  - Processed/canned/aged/cured meats (pre-packaged deli meats, hotdogs)  - MSG/glutamate:  soy sauce, flavor enhancer, pickled/preserved/marinated foods  - Sweeteners:  aspartame (Equal, Nutrasweet).  Sugar and Splenda are okay  - Vegetables:  legumes (lima beans, lentils, snow peas, fava beans, pinto peans, peas, garbanzo beans), sauerkraut, onions, olives, pickles  - Fruit:  avocados, bananas, citrus fruit (orange, lemon, grapefruit), mango  - Other:  Frozen meals, macaroni and cheese Routine exercise Stay adequately hydrated (aim for 64 oz water daily) Keep headache diary Maintain proper stress management Maintain proper sleep hygiene Do not skip meals Consider supplements:  magnesium citrate '400mg'$  daily, riboflavin '400mg'$  daily, coenzyme Q10 '100mg'$  three times daily.  I would like see you again in about 3 months.  The physicians and staff at [facility] are committed to providing excellent care. You may receive a survey requesting feedback about your  experience at our office. We strive to receive "very good" responses to the survey questions. If you feel that your experience would prevent you from giving the office a "very good " response, please contact our office to try to remedy the situation. We may be reached at [phone]. Thank you for taking the time out of your busy day to complete the survey.   Kai Levins, MD [facility]

## 2022-04-11 NOTE — Telephone Encounter (Signed)
Spoke with pt.  Informed that a PET scan has been ordered from oncology. Pt was happy to hear that.  Informed pt that CT of chest has been ordered to evaluate nodule.  Pt is not sure she needs both scans but prefers to do to PET.  Will route message to Monia Pouch to see if CT is warranted.  Pt would like to also make sure Dr Dettinger is aware of all. Pt added that she did see neurology today for HA's. She really liked the physician and was started on a new medication.  Will route message to Dr. Warrick Parisian as well.

## 2022-04-11 NOTE — Telephone Encounter (Signed)
Pt was calling back to check on Pet Scan appt. I send Dr Dettinger's nurse a message and she said that she would need an appointment with Dr. Warrick Parisian or she could call her oncologist to get pet scan order. Pt said oncologist would not schedule it. They sent order for [facility] to schedule. I sent nurse another message to speak with pt but pt had to hang up because she was going into another dr office with her husband. Pt said she would call back.

## 2022-04-17 ENCOUNTER — Telehealth: Payer: Self-pay | Admitting: *Deleted

## 2022-04-17 ENCOUNTER — Telehealth: Payer: Self-pay

## 2022-04-17 ENCOUNTER — Other Ambulatory Visit: Payer: Self-pay | Admitting: *Deleted

## 2022-04-17 DIAGNOSIS — Z1283 Encounter for screening for malignant neoplasm of skin: Secondary | ICD-10-CM | POA: Diagnosis not present

## 2022-04-17 DIAGNOSIS — Z85820 Personal history of malignant melanoma of skin: Secondary | ICD-10-CM | POA: Diagnosis not present

## 2022-04-17 DIAGNOSIS — D2271 Melanocytic nevi of right lower limb, including hip: Secondary | ICD-10-CM | POA: Diagnosis not present

## 2022-04-17 DIAGNOSIS — L720 Epidermal cyst: Secondary | ICD-10-CM | POA: Diagnosis not present

## 2022-04-17 DIAGNOSIS — L814 Other melanin hyperpigmentation: Secondary | ICD-10-CM | POA: Diagnosis not present

## 2022-04-17 DIAGNOSIS — Z08 Encounter for follow-up examination after completed treatment for malignant neoplasm: Secondary | ICD-10-CM | POA: Diagnosis not present

## 2022-04-17 DIAGNOSIS — D485 Neoplasm of uncertain behavior of skin: Secondary | ICD-10-CM | POA: Diagnosis not present

## 2022-04-17 DIAGNOSIS — D225 Melanocytic nevi of trunk: Secondary | ICD-10-CM | POA: Diagnosis not present

## 2022-04-17 NOTE — Telephone Encounter (Signed)
VM left by pt stating she has not heard when her PET scan is scheduled per recent visit with MD  Noted pt chart review- PET scan ordered on 10/24 by Dr Chryl Heck ( she was in communication with pt's PCP per need).  Returned call and obtained VM- message left per above including need for authorization which is in process and that the patient will be called either by central scheduling or this office if the PET is not approved her her insurance.

## 2022-04-17 NOTE — Telephone Encounter (Signed)
Called back to pt and give detailed report to voice mail.

## 2022-04-27 ENCOUNTER — Encounter (HOSPITAL_COMMUNITY)
Admission: RE | Admit: 2022-04-27 | Discharge: 2022-04-27 | Disposition: A | Discharge status: Home / Self Care | Payer: BC Managed Care – PPO | Source: Ambulatory Visit | Attending: Hematology and Oncology | Admitting: Hematology and Oncology

## 2022-04-27 DIAGNOSIS — R918 Other nonspecific abnormal finding of lung field: Secondary | ICD-10-CM | POA: Diagnosis not present

## 2022-04-27 DIAGNOSIS — K769 Liver disease, unspecified: Secondary | ICD-10-CM | POA: Insufficient documentation

## 2022-04-27 DIAGNOSIS — C439 Malignant melanoma of skin, unspecified: Secondary | ICD-10-CM | POA: Diagnosis not present

## 2022-04-27 LAB — GLUCOSE, CAPILLARY: Glucose-Capillary: 90 mg/dL (ref 70–99)

## 2022-04-27 MED ORDER — FLUDEOXYGLUCOSE F - 18 (FDG) INJECTION
10.2000 | Freq: Once | INTRAVENOUS | Status: AC
  Administered 2022-04-27: 10 via INTRAVENOUS

## 2022-05-01 ENCOUNTER — Telehealth: Payer: Self-pay | Admitting: *Deleted

## 2022-05-01 ENCOUNTER — Telehealth: Payer: Self-pay | Admitting: Family Medicine

## 2022-05-01 DIAGNOSIS — B3781 Candidal esophagitis: Secondary | ICD-10-CM | POA: Diagnosis not present

## 2022-05-01 DIAGNOSIS — K219 Gastro-esophageal reflux disease without esophagitis: Secondary | ICD-10-CM | POA: Diagnosis not present

## 2022-05-01 NOTE — Telephone Encounter (Signed)
This RN called pt per her request regarding results of PET scan- per home number- obtained VM - general message left to return call.  This RN then called pt's mobile number and obtained identified VM- message left informing her good results per PET scan.  This RN's name given for return call.

## 2022-05-01 NOTE — Telephone Encounter (Signed)
As far as I can tell from looking on it, it is not something that I read regularly with the head scans but it does not look like there is any spots that light up is cancerous.  They said the spot where the dissection was previously had a little bit of hypermetabolism or brighter but that is to be expected from the previous surgery.  His other than that there was a mild amount of fatty liver disease and atherosclerosis in the aorta which is to be expected for age.  From what I can tell I do not see anything that would indicate any spots of cancer, please make sure she reviews with her oncologist

## 2022-05-02 NOTE — Telephone Encounter (Signed)
Spoke with pts husband. Pt is at work and he request that we call around 8:15am tomorrow because she is off. Informed husband that we may not be able to call at this time because of rooming pts. He understood. He agreed to inform pt that we called today and she could call back when she is available.

## 2022-05-03 NOTE — Telephone Encounter (Signed)
Tried calling pt. No answer and no vmail.

## 2022-05-03 NOTE — Telephone Encounter (Signed)
Patient returning call. Please call back on 11/16

## 2022-05-04 ENCOUNTER — Telehealth: Payer: Self-pay

## 2022-05-04 ENCOUNTER — Telehealth: Payer: BC Managed Care – PPO | Admitting: Hematology and Oncology

## 2022-05-04 NOTE — Telephone Encounter (Signed)
Pt has been made aware of Dettinger's recommendations. Advised pt to contact oncology to discuss results as well since she is not getting the answers she is looking for. Pt states that she is very happy no cancer was seen but she still has coughing, problems with throat and sneezing. States that she has already seen GI and had egd and also seen ENT and nothing is better. She will think of a plan and discuss with Dettinger if needed.

## 2022-05-10 NOTE — Telephone Encounter (Signed)
Pt has been made aware of results and recommendations.

## 2022-05-15 DIAGNOSIS — Z1283 Encounter for screening for malignant neoplasm of skin: Secondary | ICD-10-CM | POA: Diagnosis not present

## 2022-05-15 DIAGNOSIS — D485 Neoplasm of uncertain behavior of skin: Secondary | ICD-10-CM | POA: Diagnosis not present

## 2022-05-15 DIAGNOSIS — D2271 Melanocytic nevi of right lower limb, including hip: Secondary | ICD-10-CM | POA: Diagnosis not present

## 2022-05-19 ENCOUNTER — Telehealth: Payer: Self-pay | Admitting: Family Medicine

## 2022-05-22 NOTE — Telephone Encounter (Signed)
Left message with husband for pt to return call

## 2022-05-22 NOTE — Telephone Encounter (Signed)
From what I can tell her last PET scan looks good, does not show any signs of new uptake that they were concerned about, again this is from what I can tell and I am not oncology.  The only thing that the scan did show is that she does have a little bit of fatty liver disease and she has some plaques in her aorta which is normal for her age.

## 2022-06-15 DIAGNOSIS — D485 Neoplasm of uncertain behavior of skin: Secondary | ICD-10-CM | POA: Diagnosis not present

## 2022-06-15 DIAGNOSIS — L814 Other melanin hyperpigmentation: Secondary | ICD-10-CM | POA: Diagnosis not present

## 2022-06-15 DIAGNOSIS — Z1283 Encounter for screening for malignant neoplasm of skin: Secondary | ICD-10-CM | POA: Diagnosis not present

## 2022-06-15 DIAGNOSIS — L570 Actinic keratosis: Secondary | ICD-10-CM | POA: Diagnosis not present

## 2022-06-15 DIAGNOSIS — I872 Venous insufficiency (chronic) (peripheral): Secondary | ICD-10-CM | POA: Diagnosis not present

## 2022-06-15 DIAGNOSIS — X32XXXD Exposure to sunlight, subsequent encounter: Secondary | ICD-10-CM | POA: Diagnosis not present

## 2022-07-12 ENCOUNTER — Ambulatory Visit: Payer: BC Managed Care – PPO | Admitting: Neurology

## 2022-07-14 NOTE — Progress Notes (Signed)
NEUROLOGY FOLLOW UP OFFICE NOTE  [name] [id number]  Subjective:  [name] is a 62 y.o. 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  To briefly review: Patient has a headache every day for the last 22 years (after skin cancer surgery). Some days it is worse then others. For the last 4-6 months it has been more concentrated around her right eyebrow/eye. Even touching this area causes a lot of pain. It is a throbbing pain, 7/10. The headache takes about 30 minutes to reach maximal intensity. She has to take Excedrin every day (at least 2 doses per day). This helps for a while. Her headaches last hours. A nap will also help. She denies photophobia or phonophobia. She has a little nausea but is not sure this is related to headaches. Her headaches do not affect her function. She denies focal neurologic deficits before, during, or after headaches, including vision loss. Her headache does not change with position change.   She endorses long standing neck pain.   She mentioned seeing clear diamonds once in her vision prior to a headache last week. This has only happened once though.   She does not remember ever being prescribed a medication for headaches.   Patient also takes Norco for back pain and ?kidney stones.   She drinks 3 cups of coffee and 2 diet sodas per day.   She does not drink EtOH. She does not smoke.   Patient was seen by PCP and ENT. CT of maxillofacial and neck showed no abnormalities. She was prescribed Augmentin for ear infection. This did not change headache symptoms.  Most recent Assessment and Plan (04/11/22): Patient's symptoms are consistent with chronic daily headaches, perhaps with migraine features. There is also a component of medication overuse as well. I will start  her on a preventative medication for headaches in hopes of reducing the need for daily excedrin.   PLAN: -Blood work: B12, folate -Nortriptyline 10 mg qhs for 1 week then increase to 20 mg qhs if no side effects.  -EKG from 02/08/22 reviewed. No cardiac arrhthymias or long QT -Limit use of pain relievers to no more than 2 days out of week to prevent risk of rebound or medication-overuse headache as able. -Limit caffeine as able  Since their last visit: B12 was low at 187. I recommended supplementation with B12 1000 mcg daily on 04/17/22. Patient is taking this.  Patient is now taking nortriptyline 20 mg qhs. She has some dry mouth, but overall has no side effects. Since starting this medication she has not had any headaches. She has not had to take any Excedrin.  She reports no new complaints.   MEDICATIONS:  Outpatient Encounter Medications as of 07/21/2022  Medication Sig   acetaminophen (TYLENOL) 500 MG tablet Take 1,000 mg by mouth every 6 (six) hours as needed for moderate pain.   atorvastatin (LIPITOR) 40 MG tablet Take 1 tablet (40 mg total) by mouth daily.   calcium carbonate (TUMS - DOSED IN MG ELEMENTAL CALCIUM) 500 MG chewable tablet Chew 1,000 mg by mouth daily as needed for indigestion or heartburn.   carvedilol (COREG) 3.125 MG tablet Take 1 tablet (3.125 mg total) by mouth 2 (two) times daily with a meal.   docusate sodium (COLACE) 100 MG capsule Take  1 capsule (100 mg total) by mouth 2 (two) times daily as needed for mild constipation.   famotidine (PEPCID) 40 MG tablet Take 40 mg by mouth daily.   HYDROcodone-acetaminophen (NORCO) 5-325 MG tablet Take 1 tablet by mouth every 6 (six) hours as needed for moderate pain.   lidocaine (LIDODERM) 5 % Place 1 patch onto the skin daily as needed (pain).   lisinopril (ZESTRIL) 2.5 MG tablet Take 1 tablet (2.5 mg total) by mouth every morning.   loratadine-pseudoephedrine (CLARITIN-D 24-HOUR) 10-240 MG 24 hr tablet Take 1 tablet by  mouth as needed for allergies.   nitroGLYCERIN (NITROSTAT) 0.4 MG SL tablet Place 1 tablet (0.4 mg total) under the tongue every 5 (five) minutes as needed for chest pain. DISSOLVE ONE TABLET UNDER THE TONGUE EVERY 5 MINUTES AS NEEDED FOR CHEST PAIN.  DO NOT EXCEED A TOTAL OF 3 DOSES IN 15 MINUTES   NON FORMULARY headache relief with acetamimophen, aspirin   nortriptyline (PAMELOR) 10 MG capsule Take 2 capsules (20 mg total) by mouth at bedtime. Take 1 capsule (10 mg) for 1 week, then if no significant side effects, increase to 2 capsules (20 mg) thereafter   pantoprazole (PROTONIX) 40 MG tablet Take 40 mg by mouth 2 (two) times daily.   polyethylene glycol (MIRALAX / GLYCOLAX) 17 g packet Take 17 g by mouth daily.   silver sulfADIAZINE (SILVADENE) 1 % cream Apply 1 application topically daily. Apply to shoulder nightly 7-14 days   tamsulosin (FLOMAX) 0.4 MG CAPS capsule Take 1 capsule (0.4 mg total) by mouth daily.   No facility-administered encounter medications on file as of 07/21/2022.    PAST MEDICAL HISTORY: Past Medical History:  Diagnosis Date   Anxiety    Arthritis    back (12/30/2014)   Asthma    as a child, no problems as an adult, has an albuterol inhaler   Cerebral cyst    per brain MRI 07-12-2019 unchanged 76m cyst inferomedial right frontal lobe   Chronic headaches    [place]est[place]    followed by dr nFrances Nickels  Complication of anesthesia    pt is very sensitive to benzodiazepines, pt stated "almost died"   [place]   per pt L3 and L4   [place]    w/ Tak[place]ubo---  followed by dr nJohnsie Cancel--  stress-induced --- cardiac cath 12-30-2014 ef 30-35%, recovered per cardiac MRI 03-15-2015 ef  64%   Depression    Disorder of mastoid    per brain MRI 07-12-2019 persistant large mastoid effusion   Dysuria    Environmental and seasonal allergies    Frequency of urination     GERD (gastroesophageal reflux disease)    History of asthma    childhood   History of esophageal spasm    History of kidney stones    History of melanoma excision 2001   right supraorbital (right forehead and upper eyelid )  s/p  Moh's procedure w/ sln bx,  no recurrence per pt   History of non-ST elevation myocardial infarction (NSTEMI) 12/29/2014   per cath normal coronaries , ef 30-35%---  stress-- induced Takotsubo syndrome    History of palpitations 01/05/2015   STRESS INDUCED   History of parotid cancer 2008   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-2006 s/p  right lateral parotidectomy w/ nerve dissection , modified radical neck dissection ;  completed  x35 fractions raditation 2008;  no recurrence per pt   Hyperlipidemia    Hypertension    Kidney cysts    Left ureteral calculus    Low back pain    Melanoma ([place]) 2001   face   Muscle spasm of back    Myocardial infarction [facility]) 2016   Pneumonia    as a child   PONV (postoperative nausea and vomiting)    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   Squamous cell carcinoma of skin 02/22/2021   in ist[place]- right forehead ([id number])   Takotsubo syndrome 12/29/2014   cardiologist---  dr Frances Nickels--- dx NSTEMI -- stress-induced w/ DCM--  per cath 12-30-2014 normal coronaries , ef 30-35%,  recovered ef per cardiac MRI  ef 64%   Urgency of urination    Wears glasses     PAST SURGICAL HISTORY: Past Surgical History:  Procedure Laterality Date   BACK SURGERY     BREAST BIOPSY Bilateral early 2000's   CARDIAC CATHETERIZATION  2006 (APPROX)  [place]   NORMAL   CARDIAC CATHETERIZATION  12/30/2014   CARDIAC CATHETERIZATION N/A 12/30/2014   Procedure: Left Heart Cath and Coronary Angiography;  Surgeon: Wellington Hampshire, MD;  Location: [facility] [facility];  Service: Cardiovascular;  Laterality: N/A;   CARDIOVASCULAR STRESS TEST  03-21-2012  DR Johnsie Cancel   NORMAL NUCLEAR STUDY/  NO ISCHEMIA/  EF 63%    COLONOSCOPY  10/2020   COLONOSCOPY WITH PROPOFOL  05/29/2017   CYSTOSCOPY W/ URETERAL STENT PLACEMENT Left 03/26/2013   Procedure: CYSTOSCOPY WITH RETROGRADE PYELOGRAM ;  Surgeon: Molli Hazard, MD;  Location: [facility];  Service: Urology;  Laterality: Left;   CYSTOSCOPY WITH RETROGRADE PYELOGRAM, URETEROSCOPY AND STENT PLACEMENT Bilateral 03/19/2013   Procedure: CYSTOSCOPY WITH RETROGRADE PYELOGRAM, BILATERAL URETEROSCOPY AND STENT PLACEMENT LEFT URETER,BILATERAL STONE EXTRACTION , HOLMIUM LASER LEFT URETER;  Surgeon: Molli Hazard, MD;  Location: [facility];  Service: Urology;  Laterality: Bilateral;   CYSTOSCOPY WITH STENT PLACEMENT Left 03/26/2013   Procedure: CYSTOSCOPY WITH STENT PLACEMENT;  Surgeon: Molli Hazard, MD;  [facility];  Service: Urology;  Laterality: Left;   CYSTOSCOPY/URETEROSCOPY/HOLMIUM LASER/STENT PLACEMENT Left 07/07/2020   Procedure: CYSTOSCOPY LEFT URETEROSCOPY/HOLMIUM LASER/STENT PLACEMENT;  Surgeon: Lucas Mallow, MD;  Location: [facility];  Service: Urology;  Laterality: Left;   DIAGNOSTIC LAPAROSCOPY  04/12/2009   ESOPHAGOGASTRODUODENOSCOPY (EGD) WITH PROPOFOL N/A 02/10/2016   Procedure: ESOPHAGOGASTRODUODENOSCOPY (EGD) WITH PROPOFOL;  Surgeon: Ronald Lobo, MD;  Location: [facility];  Service: Endoscopy;  Laterality: N/A;   KIDNEY SURGERY  1966   BILATERAL URETER'S DILATATION   LUMBAR LAMINECTOMY/DECOMPRESSION MICRODISCECTOMY  05/18/2011   Procedure: LUMBAR LAMINECTOMY/DECOMPRESSION MICRODISCECTOMY;  Surgeon: Johnn Hai;  Location: [facility];  Service: Orthopedics;  Laterality: Right;  Decompression Lumbar 4-Lumbar 5  Right    (xray)    LUMBAR LAMINECTOMY/DECOMPRESSION MICRODISCECTOMY N/A 05/06/2021   Procedure: Microlumbar decompression L5-S1;  Surgeon: Susa Day, MD;  Location: [place];  Service: Orthopedics;  Laterality: N/A;  3 C-bed 90 mins   MELANOMA EXCISION WITH  SENTINEL LYMPH NODE BIOPSY  2001   moh's procedure/  RIGHT FOREHEAD AND UPPER EYEBROW   RIGHT LATERAL PAROTIDECTOMY W/ NERVE DISSECTION / RIGHT MODIFIED RADICAL NECK DISSECTION SPARING SCM ELEVENTH NERVE AND INTERNAL JUGULAR VEIN  09-12-2006  DR DWIGHT BATES   DR DWIGHT BATES; "inside gland; lots of lymph nodes"    ALLERGIES: Allergies  Allergen Reactions   Cephalexin Shortness Of Breath and Rash  Lorazepam Shortness Of Breath and Other (See Comments)    She may be very sensitive to benzo.    Stops breathing    Promethazine     Felt strange, "knocked me out"   Seasonal Ic [Octacosanol] Other (See Comments)    FAMILY HISTORY: Family History  Problem Relation Age of Onset   Hypertension Mother    COPD Mother    Breast cancer Mother        breast   Dementia Mother    Heart disease Father    Cancer Father        Colorectal   Hyperlipidemia Father    Hypertension Father    Colon cancer Sister     SOCIAL HISTORY: Social History   Tobacco Use   Smoking status: Never   Smokeless tobacco: Never  Vaping Use   Vaping Use: Never used  Substance Use Topics   Alcohol use: Not Currently    Comment: occasional- 1 drink per month   Drug use: Never   Social History   Social History Narrative   Right handed   Caffeine 3 cups daily   Lives one story home with basement 1 time weekly      Objective:  Vital Signs:  BP 126/84   Pulse 87   Resp 20   Ht '5\' 4"'$  (1.626 m)   Wt 192 lb (87.1 kg)   LMP 05/18/2011   SpO2 97%   BMI 32.96 kg/m   General: No acute distress.  Patient appears well-groomed.   Head:  Normocephalic/atraumatic Neck: supple, no paraspinal tenderness, full range of motion Heart:  Regular rate and rhythm Lungs:  Clear to auscultation bilaterally Back: No paraspinal tenderness Neurological Exam: alert and oriented to person, place, and time.  Speech fluent and not dysarthric, language intact.  CN II-XII intact. Bulk and tone normal, muscle strength 5/5  throughout.  Sensation to light touch intact.  Deep tendon reflexes 2+ throughout.  Gait normal   Labs and Imaging review: New results: 04/11/22: B12: 187 Folate: wnl  Previously reviewed results: Internal labs: Recent Labs       Lab Results  Component Value Date    HGBA1C 5.5 01/03/2017      Recent Labs  No results found for: "VITAMINB12"   Recent Labs       Lab Results  Component Value Date    TSH 1.250 02/03/2022      Recent Labs'[]'$ Expand by Default  No results found for: "ESRSEDRATE", "POCTSEDRATE"     Normal or unremarkable: CMP CBC significant for elevated MCV (99)   Imaging: CT maxillofacial wo contrast (01/30/22): FINDINGS: Paranasal sinuses:   Frontal: Normally aerated. Patent frontal sinus drainage pathways.   Ethmoid: Minor mucosal thickening.   Maxillary: Minor mucosal thickening.   Sphenoid: Normally aerated. Patent sphenoethmoidal recesses.   Right ostiomeatal unit: Patent.   Left ostiomeatal unit: Patent.   Nasal passages: Patent. Intact nasal septum is mildly deviated to the right.   Anatomy: Cribriform plate and fovea ethmoidalis are intact. The fovea ethmoidalis is slightly higher on the left. Sellar sphenoid pneumatization pattern. No dehiscence of carotid or optic canals. No onodi cell.   Other: Resection of right parotid and submandibular glands with additional soft tissue thickening presumably reflecting sequelae of neck dissection. Similar appearance to prior neck CT.   IMPRESSION: No significant paranasal sinus inflammatory changes at the time of imaging. Drainage pathways are patent. Mild rightward deviation of the nasal septum.   CT soft tissue neck w contrast (  08/08/21): FINDINGS: Pharynx and larynx: Streak and beam hardening artifact arising from dental restoration partially obscures the oral cavity. No appreciable swelling or discrete mass within the oral cavity, pharynx or larynx.   Salivary glands: Redemonstrated  sequela of prior right parotid and submandibular gland resection, and presumed right neck is dissection (with unchanged scarring at these sites).Unremarkable appearance of the left parotid and submandibular glands.   Thyroid: Atrophy of the right lobe. Otherwise unremarkable.   Lymph nodes: No pathologically enlarged cervical chain lymph nodes identified.   Vascular: The major vascular structures of the neck are patent. Atherosclerotic plaque within the visualized aortic arch, proximal major branch vessels of the neck and carotid arteries.   Limited intracranial: No appreciable acute intracranial abnormality within the field of view.   Visualized orbits: Incompletely imaged. No orbital mass or acute orbital finding at the imaged levels.   Mastoids and visualized paranasal sinuses: Small mucous retention cyst within the left maxillary sinus. Chronic right mastoid effusion.   Skeleton: Straightening of the expected cervical lordosis. Cervical spondylosis. No acute bony abnormality or aggressive osseous lesion.   Upper chest: No consolidation within the imaged lung apices.   IMPRESSION: Redemonstrated sequela of prior right parotid and submandibular gland resection, and presumed right neck dissection. No evidence of residual/recurrent tumor.   No pathologically enlarged cervical chain lymph nodes.   Small left maxillary sinus mucous retention cyst.   Chronic right mastoid effusion.   Cervical spondylosis.   Aortic Atherosclerosis (ICD10-I70.0).   CT renal stone study (04/03/22): FINDINGS: Lower chest: In image 20 of series 5, there is a 1 cm nodular density in lingula which was not evident in the previous study. There is no focal consolidation in the lower lung fields. There is no pleural effusion.   Hepatobiliary: No focal abnormalities are seen in liver. There is no dilation of bile ducts.   Pancreas: No focal abnormalities are seen.   Spleen: Unremarkable.    Adrenals/Urinary Tract: Adrenals are unremarkable. There is no hydronephrosis. There are no renal stones. Previously seen left renal stones are not evident in the current study. There are no calcific densities in the courses of both ureters. Urinary bladder is not distended.   Stomach/Bowel: Stomach is unremarkable. Possible oral medication is seen in the lumen of stomach. Small bowel loops are not dilated. Appendix is difficult to visualize. There are no secondary signs of appendicitis in the right lower quadrant. There is no wall thickening in colon. There is no pericolic stranding. There is incomplete distention of sigmoid colon.   Vascular/Lymphatic: Scattered arterial calcifications are seen.   Reproductive: Unremarkable.   Other: There is no ascites or pneumoperitoneum. Small umbilical hernia containing fat is seen.   Musculoskeletal: There is 30% decrease in height of body of L3 vertebra. Schmorl's node is seen in the upper endplate of body of L3 vertebra. There is 50% decrease in height of body of L4 vertebra. These findings were not seen in the previous examination.   IMPRESSION: There is no evidence of intestinal obstruction or pneumoperitoneum. There is no hydronephrosis.   There is a new 1 cm noncalcified nodular density in the lingula and left lower lung field. Consider one of the following in 3 months for both low-risk and high-risk individuals: (a) repeat chest CT, (b) follow-up PET-CT, or (c) tissue sampling. This recommendation follows the consensus statement: Guidelines for Management of Incidental Pulmonary Nodules Detected on CT Images: From the Fleischner Society 2017; Radiology 2017; 284:228-243.   There is interval  decrease in height of bodies of L3 and L4 vertebrae since 04/21/2016 suggesting fractures of indeterminate age. Please correlate with clinical symptoms and physical examination findings.  Assessment/Plan:  This is [name], a 62  y.o. female with chronic headache, likely migraines. She initially had medication overuse headache as well. She is currently doing well on low dose Nortriptyline, with no headaches since starting the medication at the end of 03/2022. B12 deficiency may have also been contributing to symptoms.  Plan: -Continue Nortriptyline 20 mg qhs -Limit use of pain relievers to no more than 2 days out of week to prevent risk of rebound or medication-overuse headache as able. -Limit caffeine as able -Continue B12 supplementation, 1000 mcg daily  Return to clinic in 6 months  Total time spent reviewing records, interview, history/exam, documentation, and coordination of care on day of encounter:  25 min  Kai Levins, MD

## 2022-07-21 ENCOUNTER — Ambulatory Visit: Payer: BC Managed Care – PPO | Admitting: Neurology

## 2022-07-21 ENCOUNTER — Encounter: Payer: Self-pay | Admitting: Neurology

## 2022-07-21 VITALS — BP 126/84 | HR 87 | Resp 20 | Ht 64.0 in | Wt 192.0 lb

## 2022-07-21 DIAGNOSIS — G43709 Chronic migraine without aura, not intractable, without status migrainosus: Secondary | ICD-10-CM

## 2022-07-21 DIAGNOSIS — R519 Headache, unspecified: Secondary | ICD-10-CM | POA: Diagnosis not present

## 2022-07-21 DIAGNOSIS — E538 Deficiency of other specified B group vitamins: Secondary | ICD-10-CM

## 2022-07-21 DIAGNOSIS — G8929 Other chronic pain: Secondary | ICD-10-CM | POA: Diagnosis not present

## 2022-07-21 MED ORDER — NORTRIPTYLINE HCL 10 MG PO CAPS: 20.0000 mg | ORAL_CAPSULE | Freq: Every day | ORAL | 11 refills | Status: DC

## 2022-07-21 NOTE — Patient Instructions (Signed)
Plan: -Continue Nortriptyline 20 mg at bedtime. I sent refills to your pharmacy. -Limit use of pain relievers to no more than 2 days out of week to prevent risk of rebound or medication-overuse headache as able. -Limit caffeine as able  I would like to see you back in 6 months or sooner if needed.  The physicians and staff at [facility] are committed to providing excellent care. You may receive a survey requesting feedback about your experience at our office. We strive to receive "very good" responses to the survey questions. If you feel that your experience would prevent you from giving the office a "very good " response, please contact our office to try to remedy the situation. We may be reached at [phone]. Thank you for taking the time out of your busy day to complete the survey.  Kai Levins, MD [facility]

## 2022-07-24 NOTE — Progress Notes (Deleted)
CARDIOLOGY OFFICE NOTE  Date:  07/24/2022    [name] Date of Birth: [birth year] Medical Record #[id number]  PCP:  Dettinger, Fransisca Kaufmann, MD  Cardiologist:  Johnsie Cancel   No chief complaint on file.    History of Present Illness: [name] is a 62 y.o. female who presents for f/u Takatsubo DCM, Abnormal ECG, labile BP and chest pain   She has a history of chronically abnormal EKG. Remote normal cath in [facility] many years ago. Admitted in 2016 with chest pain - found to have Tkatsubo with EF 45% - no CAD by cath - MRI in 02/2015 showed normal EF. Hospital visit in 06/2015 for chest pain with negative evaluation.   Cardiac CT done 05/15/17 normal with calcium score 0  Seen in [place] for SSCP 07/06/18 R/O normal CXR thought to have gas Reviewed All records Troponin negative x 2   Has anxiety and depression   Seen in ER 08/20/19 for chest pain right sided ? Similar to esophageal pain with spasm ECG and troponins normal Told to f/u with GI   Now Working 5 days/week in [place]   Husband also a patient of mine and she gets overwhelmed at times with his health   Went to [place] for [employer] event with her husband  2023 He is a founder of large group and known nationally   August felt poorly Seeing ENT/GI Headaches with sinus pain sneezing and cough  Had atypical SSCP  Esophageal EMT did ECG I reviewed and normal other than LAD   ***   Past Medical History:  Diagnosis Date   Anxiety    Arthritis    back (12/30/2014)   Asthma    as a child, no problems as an adult, has an albuterol inhaler   Cerebral cyst    per brain MRI 07-12-2019 unchanged 58m cyst inferomedial right frontal lobe   Chronic headaches    Chronic systolic (congestive) heart failure ([facility]    followed by dr nFrances Nickels  Complication of anesthesia    pt is very sensitive to benzodiazepines, pt stated "almost died"   [place]   per pt L3 and L4    DCM (dilated cardiomyopathy) ([place]    w/ Takatsubo---  followed by dr nJohnsie Cancel--  stress-induced --- cardiac cath 12-30-2014 ef 30-35%, recovered per cardiac MRI 03-15-2015 ef  64%   Depression    Disorder of mastoid    per brain MRI 07-12-2019 persistant large mastoid effusion   Dysuria    Environmental and seasonal allergies    Frequency of urination    GERD (gastroesophageal reflux disease)    History of asthma    childhood   History of esophageal spasm    History of kidney stones    History of melanoma excision 2001   right supraorbital (right forehead and upper eyelid )  s/p  Moh's procedure w/ sln bx,  no recurrence per pt   History of non-ST elevation myocardial infarction (NSTEMI) 12/29/2014   per cath normal coronaries , ef 30-35%---  stress-- induced Takotsubo syndrome    History of palpitations 01/05/2015   STRESS INDUCED   History of parotid cancer 2008   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-2006 s/p  right lateral parotidectomy w/ nerve dissection , modified radical neck dissection ;  completed  x35 fractions raditation 2008;  no recurrence per pt   Hyperlipidemia    Hypertension  Kidney cysts    Left ureteral calculus    Low back pain    Melanoma ([place]) 2001   face   Muscle spasm of back    Myocardial infarction [facility]) 2016   Pneumonia    as a child   PONV (postoperative nausea and vomiting)    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   Squamous cell carcinoma of skin 02/22/2021   in [place]- right forehead ([id number])   Takotsubo syndrome 12/29/2014   cardiologist---  dr Frances Nickels--- dx NSTEMI -- stress-induced w/ DCM--  per cath 12-30-2014 normal coronaries , ef 30-35%,  recovered ef per cardiac MRI  ef 64%   Urgency of urination    Wears glasses     Past Surgical History:  Procedure Laterality Date   BACK SURGERY     BREAST BIOPSY Bilateral early 2000's   CARDIAC CATHETERIZATION  2006 (APPROX)  [place]   NORMAL   CARDIAC  CATHETERIZATION  12/30/2014   CARDIAC CATHETERIZATION N/A 12/30/2014   Procedure: Left Heart Cath and Coronary Angiography;  Surgeon: Wellington Hampshire, MD;  Location: [place] [facility];  Service: Cardiovascular;  Laterality: N/A;   CARDIOVASCULAR STRESS TEST  03-21-2012  DR Johnsie Cancel   NORMAL NUCLEAR STUDY/  NO ISCHEMIA/  EF 63%   COLONOSCOPY  10/2020   COLONOSCOPY WITH PROPOFOL  05/29/2017   CYSTOSCOPY W/ URETERAL STENT PLACEMENT Left 03/26/2013   Procedure: CYSTOSCOPY WITH RETROGRADE PYELOGRAM ;  Surgeon: Molli Hazard, MD;  Location: [facility];  Service: Urology;  Laterality: Left;   CYSTOSCOPY WITH RETROGRADE PYELOGRAM, URETEROSCOPY AND STENT PLACEMENT Bilateral 03/19/2013   Procedure: CYSTOSCOPY WITH RETROGRADE PYELOGRAM, BILATERAL URETEROSCOPY AND STENT PLACEMENT LEFT URETER,BILATERAL STONE EXTRACTION , HOLMIUM LASER LEFT URETER;  Surgeon: Molli Hazard, MD;  Location: [facility];  Service: Urology;  Laterality: Bilateral;   CYSTOSCOPY WITH STENT PLACEMENT Left 03/26/2013   Procedure: CYSTOSCOPY WITH STENT PLACEMENT;  Surgeon: Molli Hazard, MD;  Location: [facility]  Service: Urology;  Laterality: Left;   CYSTOSCOPY/URETEROSCOPY/HOLMIUM LASER/STENT PLACEMENT Left 07/07/2020   Procedure: CYSTOSCOPY LEFT URETEROSCOPY/HOLMIUM LASER/STENT PLACEMENT;  Surgeon: Lucas Mallow, MD;  Location: [facility];  Service: Urology;  Laterality: Left;   DIAGNOSTIC LAPAROSCOPY  04/12/2009   ESOPHAGOGASTRODUODENOSCOPY (EGD) WITH PROPOFOL N/A 02/10/2016   Procedure: ESOPHAGOGASTRODUODENOSCOPY (EGD) WITH PROPOFOL;  Surgeon: Ronald Lobo, MD;  Location: [facility];  Service: Endoscopy;  Laterality: N/A;   KIDNEY SURGERY  1966   BILATERAL URETER'S DILATATION   LUMBAR LAMINECTOMY/DECOMPRESSION MICRODISCECTOMY  05/18/2011   Procedure: LUMBAR LAMINECTOMY/DECOMPRESSION MICRODISCECTOMY;  Surgeon: Johnn Hai;  Location: [facility];  Service:  Orthopedics;  Laterality: Right;  Decompression Lumbar 4-Lumbar 5  Right    (xray)    LUMBAR LAMINECTOMY/DECOMPRESSION MICRODISCECTOMY N/A 05/06/2021   Procedure: Microlumbar decompression L5-S1;  Surgeon: Susa Day, MD;  Location: [facility];  Service: Orthopedics;  Laterality: N/A;  3 C-bed 90 mins   MELANOMA EXCISION WITH SENTINEL LYMPH NODE BIOPSY  2001   moh's procedure/  RIGHT FOREHEAD AND UPPER EYEBROW   RIGHT LATERAL PAROTIDECTOMY W/ NERVE DISSECTION / RIGHT MODIFIED RADICAL NECK DISSECTION SPARING SCM ELEVENTH NERVE AND INTERNAL JUGULAR VEIN  09-12-2006  DR DWIGHT Redmond Baseman   DR DWIGHT BATES; "inside gland; lots of lymph nodes"     Medications: No outpatient medications have been marked as taking for the 08/02/22 encounter (Appointment) with Josue Hector, MD.     Allergies: Allergies  Allergen Reactions  Cephalexin Shortness Of Breath and Rash   Lorazepam Shortness Of Breath and Other (See Comments)    She may be very sensitive to benzo.    Stops breathing    Promethazine     Felt strange, "knocked me out"   Seasonal Ic [Octacosanol] Other (See Comments)    Social History: The patient  reports that she has never smoked. She has never used smokeless tobacco. She reports that she does not currently use alcohol. She reports that she does not use drugs.   Family History: The patient's family history includes Breast cancer in her mother; COPD in her mother; Cancer in her father; Colon cancer in her sister; Dementia in her mother; Heart disease in her father; Hyperlipidemia in her father; Hypertension in her father and mother.   Review of Systems: Please see the history of present illness.   Otherwise, the review of systems is positive for none.   All other systems are reviewed and negative.   Physical Exam: VS:  LMP 05/18/2011  .  BMI There is no height or weight on file to calculate BMI.  Wt Readings from Last 3 Encounters:  07/21/22 192 lb (87.1 kg)  04/11/22 204 lb  (92.5 kg)  04/05/22 204 lb 4.8 oz (92.7 kg)   Affect appropriate Healthy:  appears stated age 25: normal Neck supple with no adenopathy JVP normal no bruits no thyromegaly Lungs clear with no wheezing and good diaphragmatic motion Heart:  S1/S2 no murmur, no rub, gallop or click PMI normal Abdomen: benighn, BS positve, no tenderness, no AAA no bruit.  No HSM or HJR Distal pulses intact with no bruits No edema Neuro non-focal post lumbar surgery x 2  Skin warm and dry No muscular weakness    LABORATORY DATA:  EKG:  03/28/17  NSR with anterior T wave changes chronic .  Lab Results  Component Value Date   WBC 3.1 (L) 02/03/2022   HGB 12.8 02/03/2022   HCT 38.8 02/03/2022   PLT 158 02/03/2022   GLUCOSE 100 (H) 02/03/2022   CHOL 299 (H) 06/24/2021   TRIG 162 (H) 06/24/2021   HDL 57 06/24/2021   LDLCALC 212 (H) 06/24/2021   ALT 30 02/03/2022   AST 29 02/03/2022   NA 145 (H) 02/03/2022   K 4.2 02/03/2022   CL 106 02/03/2022   CREATININE 0.93 02/03/2022   BUN 17 02/03/2022   CO2 24 02/03/2022   TSH 1.250 02/03/2022   INR 1.10 12/30/2014   HGBA1C 5.5 01/03/2017     BNP (last 3 results) No results for input(s): "BNP" in the last 8760 hours.  ProBNP (last 3 results) No results for input(s): "PROBNP" in the last 8760 hours.   Other Studies Reviewed Today:  Cardiac MRI IMPRESSION 02/2015: 1) Normal LV size and function   2) Quantitative EF 64% no RWMA;s   3) No scar tissue or hyperenhancement   Overall findings consistent with Takatsubo DCM   Electronically Signed   By: Jenkins Rouge M.D.   On: 03/18/2015 16:52   Echo Study Conclusions from 12/2014   - Left ventricle: The cavity size was normal. Systolic function was   mildly reduced. The estimated ejection fraction was 45%. Diffuse   hypokinesis. Doppler parameters are consistent with abnormal left   ventricular relaxation (grade 1 diastolic dysfunction). There was   no evidence of elevated  ventricular filling pressure by Doppler   parameters. - Aortic valve: Trileaflet; normal thickness leaflets. There was   trivial regurgitation. -  Aortic root: The aortic root was normal in size. - Mitral valve: Structurally normal valve. There was no   regurgitation. - Left atrium: The atrium was normal in size. - Right ventricle: Systolic function was normal. - Right atrium: The atrium was normal in size. - Tricuspid valve: There was trivial regurgitation. - Pulmonic valve: There was no regurgitation. - Pulmonary arteries: Systolic pressure was within the normal   range. - Inferior vena cava: The vessel was normal in size. - Pericardium, extracardiac: There was no pericardial effusion.    Procedures   Left Heart Cath and Coronary Angiography 12/2014  Conclusion   1. Normal coronary arteries. 2. Moderately to severely reduced LV systolic function with an ejection fraction of 30-35% with severe hypokinesis of the mid distal anterior, apical and mid to distal inferior walls consistent with stress-induced cardiomyopathy. 3. Mildly elevated left ventricular end-diastolic pressure.   Recommendations: Recommend a small dose beta blocker and ACE inhibitor. Avoid stress. Monitor for at least another day and possible discharge home tomorrow if she remains stable.     Assessment/Plan:  1. Chest pain: no CAD cath 2016    Normal cardiac CT with calcium score 0 05/15/17 non cardiac pain Observe  History of esophageal spasm f/u GI   2. HTN - improved continue current meds   3. Takotsubo DCM  - EF 64% recovered by MRI 03/18/15 . She has no symptoms of CHF. Would keep on her current regimen for now.  At risk for recurrence given current anxiety and depression will update TTE  4. HLD:  on statin LDL 105 acceptable with calcium score 0   5. GERD:  low carb diet continue pantoprazole   6. Headaches:  f/u neurology Norco and lidoderm patch Also has pamelor for pain post parotid surgery   7.  Melanoma:  history of PET CT 05/01/22 negative for recurrence   TTE  F/U in a year   Jenkins Rouge

## 2022-08-02 ENCOUNTER — Ambulatory Visit: Payer: BC Managed Care – PPO | Admitting: Cardiovascular Disease

## 2022-09-25 ENCOUNTER — Telehealth: Payer: Self-pay | Admitting: Family Medicine

## 2022-09-25 NOTE — Telephone Encounter (Signed)
Patient said she has a place on her throat and assumes that it is a swollen lymph node but is a two time cancer survivor and wants her PCP opinion, does not want to see anyone other than PCP. She needs a morning appointment. Aware that we could not schedule her for over a month out but wanted to speak to nurse about getting her in so that she does not have to go to her oncologist. Please call back.

## 2022-09-25 NOTE — Telephone Encounter (Signed)
Get her appointment, see what I have available.  Can be in one of my same-day slots on my normal days

## 2022-09-26 NOTE — Telephone Encounter (Signed)
Offered pt an 8:55 on 4/10 with Dettinger. Pt states that she is unable to come due to work. Pt states that she is off today and could also do Friday am. Informed pt that Dettinger is off today and she does not want to see any other provider. Dettinger is completely booked on Friday. No work ins.   Pt states that she will call her ENT to see if they have something today. Since they done her past surgery per pt.

## 2022-09-26 NOTE — Telephone Encounter (Signed)
Pt r/c offered appt on 10/06/22 in same day appt slot but pt wants something sooner

## 2022-10-11 DIAGNOSIS — R221 Localized swelling, mass and lump, neck: Secondary | ICD-10-CM | POA: Insufficient documentation

## 2022-10-11 DIAGNOSIS — H6121 Impacted cerumen, right ear: Secondary | ICD-10-CM | POA: Diagnosis not present

## 2022-10-11 DIAGNOSIS — R42 Dizziness and giddiness: Secondary | ICD-10-CM | POA: Diagnosis not present

## 2022-10-19 ENCOUNTER — Encounter: Payer: Self-pay | Admitting: Family Medicine

## 2022-10-20 DIAGNOSIS — H903 Sensorineural hearing loss, bilateral: Secondary | ICD-10-CM | POA: Diagnosis not present

## 2022-12-01 ENCOUNTER — Other Ambulatory Visit: Payer: Self-pay | Admitting: Family Medicine

## 2022-12-01 DIAGNOSIS — Z1231 Encounter for screening mammogram for malignant neoplasm of breast: Secondary | ICD-10-CM

## 2022-12-11 DIAGNOSIS — Z1283 Encounter for screening for malignant neoplasm of skin: Secondary | ICD-10-CM | POA: Diagnosis not present

## 2022-12-11 DIAGNOSIS — D225 Melanocytic nevi of trunk: Secondary | ICD-10-CM | POA: Diagnosis not present

## 2022-12-11 DIAGNOSIS — D485 Neoplasm of uncertain behavior of skin: Secondary | ICD-10-CM | POA: Diagnosis not present

## 2022-12-11 DIAGNOSIS — Z85820 Personal history of malignant melanoma of skin: Secondary | ICD-10-CM | POA: Diagnosis not present

## 2022-12-11 DIAGNOSIS — C44529 Squamous cell carcinoma of skin of other part of trunk: Secondary | ICD-10-CM | POA: Diagnosis not present

## 2022-12-11 DIAGNOSIS — Z08 Encounter for follow-up examination after completed treatment for malignant neoplasm: Secondary | ICD-10-CM | POA: Diagnosis not present

## 2022-12-13 ENCOUNTER — Ambulatory Visit
Admission: RE | Admit: 2022-12-13 | Discharge: 2022-12-13 | Disposition: A | Discharge status: Home / Self Care | Payer: BC Managed Care – PPO | Source: Ambulatory Visit | Attending: Family Medicine | Admitting: Family Medicine

## 2022-12-13 DIAGNOSIS — Z1231 Encounter for screening mammogram for malignant neoplasm of breast: Secondary | ICD-10-CM | POA: Diagnosis not present

## 2023-01-15 NOTE — Progress Notes (Signed)
NEUROLOGY FOLLOW UP OFFICE NOTE  [name] [id number]  Subjective:  [name] is a 62 y.o. 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  To briefly review: Initial consultation (04/11/22): Patient has a headache every day for the last 22 years (after skin cancer surgery). Some days it is worse then others. For the last 4-6 months it has been more concentrated around her right eyebrow/eye. Even touching this area causes a lot of pain. It is a throbbing pain, 7/10. The headache takes about 30 minutes to reach maximal intensity. She has to take Excedrin every day (at least 2 doses per day). This helps for a while. Her headaches last hours. A nap will also help. She denies photophobia or phonophobia. She has a little nausea but is not sure this is related to headaches. Her headaches do not affect her function. She denies focal neurologic deficits before, during, or after headaches, including vision loss. Her headache does not change with position change.   She endorses long standing neck pain.   She mentioned seeing clear diamonds once in her vision prior to a headache last week. This has only happened once though.   She does not remember ever being prescribed a medication for headaches.   Patient also takes Norco for back pain and ?kidney stones.   She drinks 3 cups of coffee and 2 diet sodas per day.   She does not drink EtOH. She does not smoke.   Patient was seen by PCP and ENT. CT of maxillofacial and neck showed no abnormalities. She was prescribed Augmentin for ear infection. This did not change headache symptoms.  07/21/22: B12 was low at 187. I recommended supplementation with B12 1000 mcg daily on 04/17/22. Patient is taking this.   Patient is now taking nortriptyline 20 mg qhs. She has  some dry mouth, but overall has no side effects. Since starting this medication she has not had any headaches. She has not had to take any Excedrin.   She reports no new complaints.   Most recent Assessment and Plan (07/21/22): This is [name], a 63 y.o. female with chronic headache, likely migraines. She initially had medication overuse headache as well. She is currently doing well on low dose Nortriptyline, with no headaches since starting the medication at the end of 03/2022. B12 deficiency may have also been contributing to symptoms.   Plan: -Continue Nortriptyline 20 mg qhs -Limit use of pain relievers to no more than 2 days out of week to prevent risk of rebound or medication-overuse headache as able. -Limit caffeine as able -Continue B12 supplementation, 1000 mcg daily  Since their last visit: Patient continues to take Nortriptyline 20 mg at bedtime. She will have a headache every week to 10 day (3-4 per month). They are less severe when she does get a headache. When she gets a headache, she will take Tylenol (2 tablets) (about 3-4 times per month. This will resolve headache in less than 2 hours.  She denies significant neck pain. She has had an increase in stressors. Her sleep is good. She does feel reduced energy and is not too active.  She is still taking B12.   MEDICATIONS:  Outpatient Encounter Medications as of 01/19/2023  Medication Sig   acetaminophen (TYLENOL) 500 MG tablet Take  1,000 mg by mouth every 6 (six) hours as needed for moderate pain.   atorvastatin (LIPITOR) 40 MG tablet Take 1 tablet (40 mg total) by mouth daily.   calcium carbonate (TUMS - DOSED IN MG ELEMENTAL CALCIUM) 500 MG chewable tablet Chew 1,000 mg by mouth daily as needed for indigestion or heartburn.   carvedilol (COREG) 3.125 MG tablet Take 1 tablet (3.125 mg total) by mouth 2 (two) times daily with a meal.   docusate sodium (COLACE) 100 MG capsule Take 1 capsule (100 mg total) by mouth 2 (two)  times daily as needed for mild constipation.   famotidine (PEPCID) 40 MG tablet Take 40 mg by mouth daily.   HYDROcodone-acetaminophen (NORCO) 5-325 MG tablet Take 1 tablet by mouth every 6 (six) hours as needed for moderate pain.   lidocaine (LIDODERM) 5 % Place 1 patch onto the skin daily as needed (pain).   lisinopril (ZESTRIL) 2.5 MG tablet Take 1 tablet (2.5 mg total) by mouth every morning.   loratadine-pseudoephedrine (CLARITIN-D 24-HOUR) 10-240 MG 24 hr tablet Take 1 tablet by mouth as needed for allergies.   nitroGLYCERIN (NITROSTAT) 0.4 MG SL tablet Place 1 tablet (0.4 mg total) under the tongue every 5 (five) minutes as needed for chest pain. DISSOLVE ONE TABLET UNDER THE TONGUE EVERY 5 MINUTES AS NEEDED FOR CHEST PAIN.  DO NOT EXCEED A TOTAL OF 3 DOSES IN 15 MINUTES   NON FORMULARY headache relief with acetamimophen, aspirin   nortriptyline (PAMELOR) 10 MG capsule Take 2 capsules (20 mg total) by mouth at bedtime. Take 1 capsule (10 mg) for 1 week, then if no significant side effects, increase to 2 capsules (20 mg) thereafter   pantoprazole (PROTONIX) 40 MG tablet Take 40 mg by mouth 2 (two) times daily.   polyethylene glycol (MIRALAX / GLYCOLAX) 17 g packet Take 17 g by mouth daily.   silver sulfADIAZINE (SILVADENE) 1 % cream Apply 1 application topically daily. Apply to shoulder nightly 7-14 days   tamsulosin (FLOMAX) 0.4 MG CAPS capsule Take 1 capsule (0.4 mg total) by mouth daily.   No facility-administered encounter medications on file as of 01/19/2023.    PAST MEDICAL HISTORY: Past Medical History:  Diagnosis Date   Anxiety    Arthritis    back (12/30/2014)   Asthma    as a child, no problems as an adult, has an albuterol inhaler   Cerebral cyst    per brain MRI 07-12-2019 unchanged 6mm cyst inferomedial right frontal lobe   Chronic headaches    Chronic systolic (congestive) heart failure (HCC)    followed by dr Estrella Myrtle   Complication of anesthesia    pt is very sensitive  to benzodiazepines, pt stated "almost died"   Compression fracture of lumbar vertebra (HCC) 05/27/2020   per pt L3 and L4   DCM (dilated cardiomyopathy) (HCC)    w/ Takatsubo---  followed by dr Eden Emms---  stress-induced --- cardiac cath 12-30-2014 ef 30-35%, recovered per cardiac MRI 03-15-2015 ef  64%   Depression    Disorder of mastoid    per brain MRI 07-12-2019 persistant large mastoid effusion   Dysuria    Environmental and seasonal allergies    Frequency of urination    GERD (gastroesophageal reflux disease)    History of asthma    childhood   History of esophageal spasm    History of kidney stones    History of melanoma excision 2001   right supraorbital (right forehead and upper eyelid )  s/p  Moh's procedure w/ sln bx,  no recurrence per pt   History of non-ST elevation myocardial infarction (NSTEMI) 12/29/2014   per cath normal coronaries , ef 30-35%---  stress-- induced Takotsubo syndrome    History of palpitations 01/05/2015   STRESS INDUCED   History of parotid cancer 2008   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-2006 s/p  right lateral parotidectomy w/ nerve dissection , modified radical neck dissection ;  completed  x35 fractions raditation 2008;  no recurrence per pt   Hyperlipidemia    Hypertension    Kidney cysts    Left ureteral calculus    Low back pain    Melanoma (HCC) 2001   face   Muscle spasm of back    Myocardial infarction (HCC) 2016   Pneumonia    as a child   PONV (postoperative nausea and vomiting)    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   Squamous cell carcinoma of skin 02/22/2021   in istu- right forehead ([id number])   Takotsubo syndrome 12/29/2014   cardiologist---  dr Estrella Myrtle--- dx NSTEMI -- stress-induced w/ DCM--  per cath 12-30-2014 normal coronaries , ef 30-35%,  recovered ef per cardiac MRI  ef 64%   Urgency of urination    Wears glasses     PAST SURGICAL HISTORY: Past Surgical History:  Procedure  Laterality Date   BACK SURGERY     BREAST BIOPSY Bilateral early 2000's   CARDIAC CATHETERIZATION  2006 (APPROX)  [place]   NORMAL   CARDIAC CATHETERIZATION  12/30/2014   CARDIAC CATHETERIZATION N/A 12/30/2014   Procedure: Left Heart Cath and Coronary Angiography;  Surgeon: Iran Ouch, MD;  Location: [facility];  Service: Cardiovascular;  Laterality: N/A;   CARDIOVASCULAR STRESS TEST  03-21-2012  DR Eden Emms   NORMAL NUCLEAR STUDY/  NO ISCHEMIA/  EF 63%   COLONOSCOPY  10/2020   COLONOSCOPY WITH PROPOFOL  05/29/2017   CYSTOSCOPY W/ URETERAL STENT PLACEMENT Left 03/26/2013   Procedure: CYSTOSCOPY WITH RETROGRADE PYELOGRAM ;  Surgeon: Milford Cage, MD;  Location: [facility];  Service: Urology;  Laterality: Left;   CYSTOSCOPY WITH RETROGRADE PYELOGRAM, URETEROSCOPY AND STENT PLACEMENT Bilateral 03/19/2013   Procedure: CYSTOSCOPY WITH RETROGRADE PYELOGRAM, BILATERAL URETEROSCOPY AND STENT PLACEMENT LEFT URETER,BILATERAL STONE EXTRACTION , HOLMIUM LASER LEFT URETER;  Surgeon: Milford Cage, MD;  Location: [facility];  Service: Urology;  Laterality: Bilateral;   CYSTOSCOPY WITH STENT PLACEMENT Left 03/26/2013   Procedure: CYSTOSCOPY WITH STENT PLACEMENT;  Surgeon: Milford Cage, MD;  Location: [facility];  Service: Urology;  Laterality: Left;   CYSTOSCOPY/URETEROSCOPY/HOLMIUM LASER/STENT PLACEMENT Left 07/07/2020   Procedure: CYSTOSCOPY LEFT URETEROSCOPY/HOLMIUM LASER/STENT PLACEMENT;  Surgeon: Crista Elliot, MD;  Location[facility];  Service: Urology;  Laterality: Left;   DIAGNOSTIC LAPAROSCOPY  04/12/2009   ESOPHAGOGASTRODUODENOSCOPY (EGD) WITH PROPOFOL N/A 02/10/2016   Procedure: ESOPHAGOGASTRODUODENOSCOPY (EGD) WITH PROPOFOL;  Surgeon: Bernette Redbird, MD;  Location: [facility];  Service: Endoscopy;  Laterality: N/A;   KIDNEY SURGERY  1966   BILATERAL URETER'S DILATATION   LUMBAR  LAMINECTOMY/DECOMPRESSION MICRODISCECTOMY  05/18/2011   Procedure: LUMBAR LAMINECTOMY/DECOMPRESSION MICRODISCECTOMY;  Surgeon: Javier Docker;  Location: [facility];  Service: Orthopedics;  Laterality: Right;  Decompression Lumbar 4-Lumbar 5  Right    (xray)    LUMBAR LAMINECTOMY/DECOMPRESSION MICRODISCECTOMY N/A 05/06/2021   Procedure: Microlumbar decompression L5-S1;  Surgeon: Jene Every, MD;  Location: [facility];  Service: Orthopedics;  Laterality: N/A;  3 C-bed 90 mins   MELANOMA EXCISION WITH SENTINEL LYMPH NODE BIOPSY  2001   moh's procedure/  RIGHT FOREHEAD AND UPPER EYEBROW   RIGHT LATERAL PAROTIDECTOMY W/ NERVE DISSECTION / RIGHT MODIFIED RADICAL NECK DISSECTION SPARING SCM ELEVENTH NERVE AND INTERNAL JUGULAR VEIN  09-12-2006  DR DWIGHT BATES   DR DWIGHT BATES; "inside gland; lots of lymph nodes"    ALLERGIES: Allergies  Allergen Reactions   Cephalexin Shortness Of Breath and Rash   Lorazepam Shortness Of Breath and Other (See Comments)    She may be very sensitive to benzo.    Stops breathing    Promethazine     Felt strange, "knocked me out"   Seasonal Ic [Octacosanol] Other (See Comments)    FAMILY HISTORY: Family History  Problem Relation Age of Onset   Hypertension Mother    COPD Mother    Breast cancer Mother        breast   Dementia Mother    Heart disease Father    Cancer Father        Colorectal   Hyperlipidemia Father    Hypertension Father    Colon cancer Sister     SOCIAL HISTORY: Social History   Tobacco Use   Smoking status: Never   Smokeless tobacco: Never  Vaping Use   Vaping status: Never Used  Substance Use Topics   Alcohol use: Not Currently    Comment: occasional- 1 drink per month   Drug use: Never   Social History   Social History Narrative   Right handed   Caffeine 3 cups daily   Lives one story home with basement 1 time weekl      Objective:  Vital Signs:  BP 137/73   Pulse 96   Ht 5\' 4"  (1.626 m)   Wt 196 lb (88.9 kg)    LMP 05/18/2011   SpO2 97%   BMI 33.64 kg/m   General: No acute distress.  Head:  Normocephalic/atraumatic Eyes:  Fundi examined, disc margins clear, no obvious papilledema Neck: supple, no paraspinal tenderness Lungs:  Non-labored breathing on room air  Neurological Exam: alert and oriented.  Speech fluent and not dysarthric, language intact.  CN II-XII intact. Bulk and tone normal, muscle strength 5/5 throughout.  Sensation to light touch intact.  Deep tendon reflexes 2+ throughout.  Finger to nose testing intact.  Gait normal.   Labs and Imaging review: No new results  Previously reviewed results: 04/11/22: B12: 187 Folate: wnl   Recent Labs           Lab Results  Component Value Date    HGBA1C 5.5 01/03/2017      Recent Labs           Lab Results  Component Value Date    TSH 1.250 02/03/2022      Normal or unremarkable: CMP CBC significant for elevated MCV (99)   Imaging: CT maxillofacial wo contrast (01/30/22): FINDINGS: Paranasal sinuses:   Frontal: Normally aerated. Patent frontal sinus drainage pathways.   Ethmoid: Minor mucosal thickening.   Maxillary: Minor mucosal thickening.   Sphenoid: Normally aerated. Patent sphenoethmoidal recesses.   Right ostiomeatal unit: Patent.   Left ostiomeatal unit: Patent.   Nasal passages: Patent. Intact nasal septum is mildly deviated to the right.   Anatomy: Cribriform plate and fovea ethmoidalis are intact. The fovea ethmoidalis is slightly higher on the left. Sellar sphenoid pneumatization pattern. No dehiscence of carotid or optic canals. No  onodi cell.   Other: Resection of right parotid and submandibular glands with additional soft tissue thickening presumably reflecting sequelae of neck dissection. Similar appearance to prior neck CT.   IMPRESSION: No significant paranasal sinus inflammatory changes at the time of imaging. Drainage pathways are patent. Mild rightward deviation of the nasal septum.    CT soft tissue neck w contrast (08/08/21): FINDINGS: Pharynx and larynx: Streak and beam hardening artifact arising from dental restoration partially obscures the oral cavity. No appreciable swelling or discrete mass within the oral cavity, pharynx or larynx.   Salivary glands: Redemonstrated sequela of prior right parotid and submandibular gland resection, and presumed right neck is dissection (with unchanged scarring at these sites).Unremarkable appearance of the left parotid and submandibular glands.   Thyroid: Atrophy of the right lobe. Otherwise unremarkable.   Lymph nodes: No pathologically enlarged cervical chain lymph nodes identified.   Vascular: The major vascular structures of the neck are patent. Atherosclerotic plaque within the visualized aortic arch, proximal major branch vessels of the neck and carotid arteries.   Limited intracranial: No appreciable acute intracranial abnormality within the field of view.   Visualized orbits: Incompletely imaged. No orbital mass or acute orbital finding at the imaged levels.   Mastoids and visualized paranasal sinuses: Small mucous retention cyst within the left maxillary sinus. Chronic right mastoid effusion.   Skeleton: Straightening of the expected cervical lordosis. Cervical spondylosis. No acute bony abnormality or aggressive osseous lesion.   Upper chest: No consolidation within the imaged lung apices.   IMPRESSION: Redemonstrated sequela of prior right parotid and submandibular gland resection, and presumed right neck dissection. No evidence of residual/recurrent tumor.   No pathologically enlarged cervical chain lymph nodes.   Small left maxillary sinus mucous retention cyst.   Chronic right mastoid effusion.   Cervical spondylosis.   Aortic Atherosclerosis (ICD10-I70.0).   CT renal stone study (04/03/22): FINDINGS: Lower chest: In image 20 of series 5, there is a 1 cm nodular density in lingula which  was not evident in the previous study. There is no focal consolidation in the lower lung fields. There is no pleural effusion.   Hepatobiliary: No focal abnormalities are seen in liver. There is no dilation of bile ducts.   Pancreas: No focal abnormalities are seen.   Spleen: Unremarkable.   Adrenals/Urinary Tract: Adrenals are unremarkable. There is no hydronephrosis. There are no renal stones. Previously seen left renal stones are not evident in the current study. There are no calcific densities in the courses of both ureters. Urinary bladder is not distended.   Stomach/Bowel: Stomach is unremarkable. Possible oral medication is seen in the lumen of stomach. Small bowel loops are not dilated. Appendix is difficult to visualize. There are no secondary signs of appendicitis in the right lower quadrant. There is no wall thickening in colon. There is no pericolic stranding. There is incomplete distention of sigmoid colon.   Vascular/Lymphatic: Scattered arterial calcifications are seen.   Reproductive: Unremarkable.   Other: There is no ascites or pneumoperitoneum. Small umbilical hernia containing fat is seen.   Musculoskeletal: There is 30% decrease in height of body of L3 vertebra. Schmorl's node is seen in the upper endplate of body of L3 vertebra. There is 50% decrease in height of body of L4 vertebra. These findings were not seen in the previous examination.   IMPRESSION: There is no evidence of intestinal obstruction or pneumoperitoneum. There is no hydronephrosis.   There is a new 1 cm noncalcified nodular density in  the lingula and left lower lung field. Consider one of the following in 3 months for both low-risk and high-risk individuals: (a) repeat chest CT, (b) follow-up PET-CT, or (c) tissue sampling. This recommendation follows the consensus statement: Guidelines for Management of Incidental Pulmonary Nodules Detected on CT Images: From the Fleischner Society  2017; Radiology 2017; 284:228-243.   There is interval decrease in height of bodies of L3 and L4 vertebrae since 04/21/2016 suggesting fractures of indeterminate age. Please correlate with clinical symptoms and physical examination findings.  Assessment/Plan:  This is [name], a 62 y.o. female with episodic migraine without aura. She previously had contributions from medication overuse when she had daily headaches. Since starting Nortriptyline, her headaches are less frequent and less intense. Of late, she has had more headaches, about 4 per month that resolve quickly with Tylenol. I will increase her Nortriptyline today to try to help with this.  She also has vit B12 that was low for which she is on supplementation. She reports low energy and fatigue, so I will also check vit D today.  Plan: -Blood work: vitamin D -Continue B12 1000 mcg daily Migraine prevention:  Increase Nortriptyline to 30 mg at bedtime Migraine rescue:  Patient doing well on rare Tylenol. Can continue this. Limit use of pain relievers to no more than 2 days out of week to prevent risk of rebound or medication-overuse headache. Encouraged to stay active Keep headache diary  Return to clinic in 6 months  Jacquelyne Balint, MD

## 2023-01-18 ENCOUNTER — Ambulatory Visit: Admitting: Family Medicine

## 2023-01-19 ENCOUNTER — Ambulatory Visit: Payer: BC Managed Care – PPO | Admitting: Neurology

## 2023-01-19 ENCOUNTER — Encounter: Payer: Self-pay | Admitting: Neurology

## 2023-01-19 ENCOUNTER — Other Ambulatory Visit: Payer: BC Managed Care – PPO

## 2023-01-19 VITALS — BP 137/73 | HR 96 | Ht 64.0 in | Wt 196.0 lb

## 2023-01-19 DIAGNOSIS — G43009 Migraine without aura, not intractable, without status migrainosus: Secondary | ICD-10-CM

## 2023-01-19 DIAGNOSIS — R5383 Other fatigue: Secondary | ICD-10-CM

## 2023-01-19 DIAGNOSIS — E538 Deficiency of other specified B group vitamins: Secondary | ICD-10-CM | POA: Diagnosis not present

## 2023-01-19 MED ORDER — NORTRIPTYLINE HCL 10 MG PO CAPS: 30.0000 mg | ORAL_CAPSULE | Freq: Every day | ORAL | 3 refills | Status: DC

## 2023-01-19 NOTE — Addendum Note (Signed)
Addended by: Barnet Glasgow on: 01/19/2023 03:27 PM   Modules accepted: Orders

## 2023-01-19 NOTE — Patient Instructions (Addendum)
I will check your vitamin D level today due to your low energy and fatigue. I will be in touch when I have that result.  Continue vitamin B12 1000 mcg daily.  For migraines: Migraine prevention:  Increase Nortriptyline to 30 mg (3 tablets) at bedtime Migraine rescue:  You are doing well on Tylenol as needed. Can continue this. Limit use of pain relievers to no more than 2 days out of week to prevent risk of rebound or medication-overuse headache. Encouraged to stay active Keep headache diary  Return to clinic in 6 months. Please let me know if you have any questions or concerns in the meantime.   The physicians and staff at [facility] are committed to providing excellent care. You may receive a survey requesting feedback about your experience at our office. We strive to receive "very good" responses to the survey questions. If you feel that your experience would prevent you from giving the office a "very good " response, please contact our office to try to remedy the situation. We may be reached at [phone]. Thank you for taking the time out of your busy day to complete the survey.  Jacquelyne Balint, MD [facility]

## 2023-01-31 ENCOUNTER — Other Ambulatory Visit: Payer: Self-pay

## 2023-01-31 ENCOUNTER — Other Ambulatory Visit: Payer: Self-pay | Admitting: Neurology

## 2023-01-31 DIAGNOSIS — G43009 Migraine without aura, not intractable, without status migrainosus: Secondary | ICD-10-CM

## 2023-01-31 MED ORDER — NORTRIPTYLINE HCL 10 MG PO CAPS: 30.0000 mg | ORAL_CAPSULE | Freq: Every day | ORAL | 3 refills | Status: DC

## 2023-02-01 DIAGNOSIS — Z85828 Personal history of other malignant neoplasm of skin: Secondary | ICD-10-CM | POA: Diagnosis not present

## 2023-02-01 DIAGNOSIS — Z08 Encounter for follow-up examination after completed treatment for malignant neoplasm: Secondary | ICD-10-CM | POA: Diagnosis not present

## 2023-02-08 ENCOUNTER — Encounter: Payer: Self-pay | Admitting: Family

## 2023-02-08 ENCOUNTER — Ambulatory Visit: Payer: BC Managed Care – PPO | Admitting: Family

## 2023-02-08 VITALS — BP 106/73 | HR 91 | Temp 97.6°F | Ht 64.0 in | Wt 196.4 lb

## 2023-02-08 DIAGNOSIS — R399 Unspecified symptoms and signs involving the genitourinary system: Secondary | ICD-10-CM | POA: Diagnosis not present

## 2023-02-08 DIAGNOSIS — N3001 Acute cystitis with hematuria: Secondary | ICD-10-CM | POA: Diagnosis not present

## 2023-02-08 LAB — MICROSCOPIC EXAMINATION
Renal Epithel, UA: NONE SEEN /hpf
Yeast, UA: NONE SEEN

## 2023-02-08 LAB — URINALYSIS, COMPLETE
Bilirubin, UA: NEGATIVE
Glucose, UA: NEGATIVE
Ketones, UA: NEGATIVE
Nitrite, UA: NEGATIVE
Protein,UA: NEGATIVE
Specific Gravity, UA: 1.02 (ref 1.005–1.030)
Urobilinogen, Ur: 0.2 mg/dL (ref 0.2–1.0)
pH, UA: 7 (ref 5.0–7.5)

## 2023-02-08 MED ORDER — NITROFURANTOIN MONOHYD MACRO 100 MG PO CAPS
100.0000 mg | ORAL_CAPSULE | Freq: Two times a day (BID) | ORAL | 0 refills | Status: DC
Start: 2023-02-08 — End: 2023-04-13

## 2023-02-08 NOTE — Progress Notes (Signed)
Subjective:    Patient ID: [name], female    DOB: [birth year], 62 y.o.   MRN: [id number]  Chief Complaint  Patient presents with   UTI     LEFT SIDE BACK PAIN. URGENCY/FREQUENCY/DYSURIA STARTED 2-3 DAYS AGO     Dysuria  This is a new problem. The current episode started in the past 7 days. The problem occurs intermittently. The problem has been waxing and waning. The quality of the pain is described as aching. The pain is at a severity of 4/10. The pain is mild. There has been no fever. Associated symptoms include flank pain (left), frequency, hesitancy, nausea and urgency. Pertinent negatives include no hematuria or vomiting. She has tried increased fluids for the symptoms. The treatment provided mild relief.      Review of Systems  Gastrointestinal:  Positive for nausea. Negative for vomiting.  Genitourinary:  Positive for dysuria, flank pain (left), frequency, hesitancy and urgency. Negative for hematuria.  All other systems reviewed and are negative.      Objective:   Physical Exam Vitals reviewed.  Constitutional:      General: She is not in acute distress.    Appearance: She is well-developed.  HENT:     Head: Normocephalic and atraumatic.  Eyes:     Pupils: Pupils are equal, round, and reactive to light.  Neck:     Thyroid: No thyromegaly.  Cardiovascular:     Rate and Rhythm: Normal rate and regular rhythm.     Heart sounds: Normal heart sounds. No murmur heard. Pulmonary:     Effort: Pulmonary effort is normal. No respiratory distress.     Breath sounds: Normal breath sounds. No wheezing.  Abdominal:     General: Bowel sounds are normal. There is no distension.     Palpations: Abdomen is soft.     Tenderness: There is no abdominal tenderness.  Musculoskeletal:        General: No tenderness. Normal range of motion.     Cervical back: Normal range of motion and neck supple.  Skin:    General: Skin is warm and dry.  Neurological:     Mental Status:  She is alert and oriented to person, place, and time.     Cranial Nerves: No cranial nerve deficit.     Deep Tendon Reflexes: Reflexes are normal and symmetric.  Psychiatric:        Behavior: Behavior normal.        Thought Content: Thought content normal.        Judgment: Judgment normal.       BP 106/73   Pulse 91   Temp 97.6 F (36.4 C) (Temporal)   Ht 5\' 4"  (1.626 m)   Wt 196 lb 6.4 oz (89.1 kg)   LMP 05/18/2011   SpO2 93%   BMI 33.71 kg/m      Assessment & Plan:   [name] comes in today with chief complaint of UTI  (LEFT SIDE BACK PAIN. URGENCY/FREQUENCY/DYSURIA STARTED 2-3 DAYS AGO )   Diagnosis and orders addressed:  1. UTI symptoms - Urinalysis, Complete - Urine Culture - nitrofurantoin, macrocrystal-monohydrate, (MACROBID) 100 MG capsule; Take 1 capsule (100 mg total) by mouth 2 (two) times daily. 1 po BId  Dispense: 14 capsule; Refill: 0  2. Acute cystitis with hematuria - nitrofurantoin, macrocrystal-monohydrate, (MACROBID) 100 MG capsule; Take 1 capsule (100 mg total) by mouth 2 (two) times daily. 1 po BId  Dispense: 14 capsule; Refill: 0  Force  fluids AZO over the counter X2 days Culture pending Follow up if symptoms worsen or do not improve    Jannifer Rodney, FNP

## 2023-02-08 NOTE — Patient Instructions (Signed)
Urinary Tract Infection, Adult  A urinary tract infection (UTI) is an infection of any part of the urinary tract. The urinary tract includes the kidneys, ureters, bladder, and urethra. These organs make, store, and get rid of urine in the body. An upper UTI affects the ureters and kidneys. A lower UTI affects the bladder and urethra. What are the causes? Most urinary tract infections are caused by bacteria in your genital area around your urethra, where urine leaves your body. These bacteria grow and cause inflammation of your urinary tract. What increases the risk? You are more likely to develop this condition if: You have a urinary catheter that stays in place. You are not able to control when you urinate or have a bowel movement (incontinence). You are female and you: Use a spermicide or diaphragm for birth control. Have low estrogen levels. Are pregnant. You have certain genes that increase your risk. You are sexually active. You take antibiotic medicines. You have a condition that causes your flow of urine to slow down, such as: An enlarged prostate, if you are female. Blockage in your urethra. A kidney stone. A nerve condition that affects your bladder control (neurogenic bladder). Not getting enough to drink, or not urinating often. You have certain medical conditions, such as: Diabetes. A weak disease-fighting system (immunesystem). Sickle cell disease. Gout. Spinal cord injury. What are the signs or symptoms? Symptoms of this condition include: Needing to urinate right away (urgency). Frequent urination. This may include small amounts of urine each time you urinate. Pain or burning with urination. Blood in the urine. Urine that smells bad or unusual. Trouble urinating. Cloudy urine. Vaginal discharge, if you are female. Pain in the abdomen or the lower back. You may also have: Vomiting or a decreased appetite. Confusion. Irritability or tiredness. A fever or  chills. Diarrhea. The first symptom in older adults may be confusion. In some cases, they may not have any symptoms until the infection has worsened. How is this diagnosed? This condition is diagnosed based on your medical history and a physical exam. You may also have other tests, including: Urine tests. Blood tests. Tests for STIs (sexually transmitted infections). If you have had more than one UTI, a cystoscopy or imaging studies may be done to determine the cause of the infections. How is this treated? Treatment for this condition includes: Antibiotic medicine. Over-the-counter medicines to treat discomfort. Drinking enough water to stay hydrated. If you have frequent infections or have other conditions such as a kidney stone, you may need to see a health care provider who specializes in the urinary tract (urologist). In rare cases, urinary tract infections can cause sepsis. Sepsis is a life-threatening condition that occurs when the body responds to an infection. Sepsis is treated in the hospital with IV antibiotics, fluids, and other medicines. Follow these instructions at home:  Medicines Take over-the-counter and prescription medicines only as told by your health care provider. If you were prescribed an antibiotic medicine, take it as told by your health care provider. Do not stop using the antibiotic even if you start to feel better. General instructions Make sure you: Empty your bladder often and completely. Do not hold urine for long periods of time. Empty your bladder after sex. Wipe from front to back after urinating or having a bowel movement if you are female. Use each tissue only one time when you wipe. Drink enough fluid to keep your urine pale yellow. Keep all follow-up visits. This is important. Contact a health   care provider if: Your symptoms do not get better after 1-2 days. Your symptoms go away and then return. Get help right away if: You have severe pain in  your back or your lower abdomen. You have a fever or chills. You have nausea or vomiting. Summary A urinary tract infection (UTI) is an infection of any part of the urinary tract, which includes the kidneys, ureters, bladder, and urethra. Most urinary tract infections are caused by bacteria in your genital area. Treatment for this condition often includes antibiotic medicines. If you were prescribed an antibiotic medicine, take it as told by your health care provider. Do not stop using the antibiotic even if you start to feel better. Keep all follow-up visits. This is important. This information is not intended to replace advice given to you by your health care provider. Make sure you discuss any questions you have with your health care provider. Document Revised: 01/11/2020 Document Reviewed: 01/16/2020 Elsevier Patient Education  2024 Elsevier Inc.  

## 2023-02-10 LAB — URINE CULTURE

## 2023-02-20 ENCOUNTER — Ambulatory Visit: Payer: BC Managed Care – PPO | Admitting: Family Medicine

## 2023-02-20 ENCOUNTER — Encounter: Payer: Self-pay | Admitting: Family Medicine

## 2023-02-20 ENCOUNTER — Other Ambulatory Visit: Payer: Self-pay | Admitting: Family Medicine

## 2023-02-20 ENCOUNTER — Ambulatory Visit
Admission: RE | Admit: 2023-02-20 | Discharge: 2023-02-20 | Disposition: A | Discharge status: Home / Self Care | Payer: BC Managed Care – PPO | Source: Ambulatory Visit | Attending: Family Medicine | Admitting: Family Medicine

## 2023-02-20 VITALS — BP 127/83 | HR 88 | Temp 98.3°F | Ht 64.0 in | Wt 196.0 lb

## 2023-02-20 DIAGNOSIS — R109 Unspecified abdominal pain: Secondary | ICD-10-CM

## 2023-02-20 DIAGNOSIS — R300 Dysuria: Secondary | ICD-10-CM

## 2023-02-20 DIAGNOSIS — K760 Fatty (change of) liver, not elsewhere classified: Secondary | ICD-10-CM | POA: Diagnosis not present

## 2023-02-20 DIAGNOSIS — R10A0 Flank pain, unspecified side: Secondary | ICD-10-CM

## 2023-02-20 DIAGNOSIS — R1030 Lower abdominal pain, unspecified: Secondary | ICD-10-CM | POA: Diagnosis not present

## 2023-02-20 LAB — URINALYSIS, ROUTINE W REFLEX MICROSCOPIC
Bilirubin, UA: NEGATIVE
Glucose, UA: NEGATIVE
Ketones, UA: NEGATIVE
Nitrite, UA: NEGATIVE
Protein,UA: NEGATIVE
Specific Gravity, UA: 1.01 (ref 1.005–1.030)
Urobilinogen, Ur: 0.2 mg/dL (ref 0.2–1.0)
pH, UA: 7 (ref 5.0–7.5)

## 2023-02-20 LAB — MICROSCOPIC EXAMINATION
Renal Epithel, UA: NONE SEEN /HPF
Yeast, UA: NONE SEEN

## 2023-02-20 NOTE — Progress Notes (Signed)
Negative for stone. Will await results of urine culture to treat. Recommend patient follow up with urology.  Per report, steatosis or fatty liver found on imaging. Recommend diet and exercise. We will work to improve blood pressure control, weight, and cholesterol. Recommend avoiding liver toxins such as alcohol and medications eliminated by the liver like tylenol. Notify the office if there are worsening symptoms of liver cirrhosis such as blood in your bowel movements or vomit, symptoms of infection, belly pain, swollen legs or ankles, trouble breathing, extreme tiredness, confusion, yellowing of the skin or whites of your eyes, called jaundice.

## 2023-02-20 NOTE — Patient Instructions (Addendum)
[address], [place]  Walk in before 2:30 pm

## 2023-02-20 NOTE — Progress Notes (Signed)
Subjective:  Patient ID: [name], female    DOB: Apr 12, 1961, 62 y.o.   MRN: [id number]  Patient Care Team: Dettinger, Elige Radon, MD as PCP - General (Family Medicine) Wendall Stade, MD as PCP - Cardiology (Cardiology) Janalyn Harder, MD (Inactive) as Consulting Physician (Dermatology)   Chief Complaint:  Dysuria (Possible kidney stones/)  HPI: [name] is a 62 y.o. female presenting on 02/20/2023 for Dysuria (Possible kidney stones/) Patient states that she was seen 12 days ago and was treated for UTI, did not receive imaging at that time. She completed her course of antibiotics. States that frequency, burning with urination, urgency, and flank pain have worsened over the past week instead of improving. Reports that pain has been so bad that at times, she has taken one of her husbands percocet.  Is established with urology - [facility] in [place]. States that she tried to get in with urology and was not able to be seen soon.  States that she has had stones history for over 50 years.  Denies fever. Endorses nausea, denies vomiting. States that she is always fatigued. Reports that pain is different from the last time. States that it is not a throbbing pain, but a constant ache.  Mostly on right side. Has some discomfort on the left. She states that her appetite is okay.   Relevant past medical, surgical, family, and social history reviewed and updated as indicated.  Allergies and medications reviewed and updated. Data reviewed: Chart in Epic.   Past Medical History:  Diagnosis Date   Anxiety    Arthritis    back (12/30/2014)   Asthma    as a child, no problems as an adult, has an albuterol inhaler   Cerebral cyst    per brain MRI 07-12-2019 unchanged 6mm cyst inferomedial right frontal lobe   Chronic headaches    Chronic systolic (congestive) heart failure (HCC)    followed by dr Estrella Myrtle   Complication of anesthesia    pt is very sensitive to benzodiazepines, pt stated  "almost died"   Compression fracture of lumbar vertebra (HCC) 05/27/2020   per pt L3 and L4   DCM (dilated cardiomyopathy) (HCC)    w/ Takatsubo---  followed by dr Eden Emms---  stress-induced --- cardiac cath 12-30-2014 ef 30-35%, recovered per cardiac MRI 03-15-2015 ef  64%   Depression    Disorder of mastoid    per brain MRI 07-12-2019 persistant large mastoid effusion   Dysuria    Environmental and seasonal allergies    Frequency of urination    GERD (gastroesophageal reflux disease)    History of asthma    childhood   History of esophageal spasm    History of kidney stones    History of melanoma excision 2001   right supraorbital (right forehead and upper eyelid )  s/p  Moh's procedure w/ sln bx,  no recurrence per pt   History of non-ST elevation myocardial infarction (NSTEMI) 12/29/2014   per cath normal coronaries , ef 30-35%---  stress-- induced Takotsubo syndrome    History of palpitations 01/05/2015   STRESS INDUCED   History of parotid cancer 2008   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-2006 s/p  right lateral parotidectomy w/ nerve dissection , modified radical neck dissection ;  completed  x35 fractions raditation 2008;  no recurrence per pt   Hyperlipidemia    Hypertension    Kidney cysts    Left ureteral calculus  Low back pain    Melanoma (HCC) 2001   face   Muscle spasm of back    Myocardial infarction [facility]) 2016   Pneumonia    as a child   PONV (postoperative nausea and vomiting)    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   Squamous cell carcinoma of skin 02/22/2021   in istu- right forehead ([id number])   Takotsubo syndrome 12/29/2014   cardiologist---  dr Estrella Myrtle--- dx NSTEMI -- stress-induced w/ DCM--  per cath 12-30-2014 normal coronaries , ef 30-35%,  recovered ef per cardiac MRI  ef 64%   Urgency of urination    Wears glasses    Past Surgical History:  Procedure Laterality Date   BACK SURGERY     BREAST BIOPSY  Bilateral early 2000's   CARDIAC CATHETERIZATION  2006 (APPROX)  [place]   NORMAL   CARDIAC CATHETERIZATION  12/30/2014   CARDIAC CATHETERIZATION N/A 12/30/2014   Procedure: Left Heart Cath and Coronary Angiography;  Surgeon: Iran Ouch, MD;  Location: [facility];  Service: Cardiovascular;  Laterality: N/A;   CARDIOVASCULAR STRESS TEST  03-21-2012  DR Eden Emms   NORMAL NUCLEAR STUDY/  NO ISCHEMIA/  EF 63%   COLONOSCOPY  10/2020   COLONOSCOPY WITH PROPOFOL  05/29/2017   CYSTOSCOPY W/ URETERAL STENT PLACEMENT Left 03/26/2013   Procedure: CYSTOSCOPY WITH RETROGRADE PYELOGRAM ;  Surgeon: Milford Cage, MD;  Location: [facility];  Service: Urology;  Laterality: Left;   CYSTOSCOPY WITH RETROGRADE PYELOGRAM, URETEROSCOPY AND STENT PLACEMENT Bilateral 03/19/2013   Procedure: CYSTOSCOPY WITH RETROGRADE PYELOGRAM, BILATERAL URETEROSCOPY AND STENT PLACEMENT LEFT URETER,BILATERAL STONE EXTRACTION , HOLMIUM LASER LEFT URETER;  Surgeon: Milford Cage, MD;  Location: [facility];  Service: Urology;  Laterality: Bilateral;   CYSTOSCOPY WITH STENT PLACEMENT Left 03/26/2013   Procedure: CYSTOSCOPY WITH STENT PLACEMENT;  Surgeon: Milford Cage, MD;  Location: [facility];  Service: Urology;  Laterality: Left;   CYSTOSCOPY/URETEROSCOPY/HOLMIUM LASER/STENT PLACEMENT Left 07/07/2020   Procedure: CYSTOSCOPY LEFT URETEROSCOPY/HOLMIUM LASER/STENT PLACEMENT;  Surgeon: Crista Elliot, MD;  Location[facility];  Service: Urology;  Laterality: Left;   DIAGNOSTIC LAPAROSCOPY  04/12/2009   ESOPHAGOGASTRODUODENOSCOPY (EGD) WITH PROPOFOL N/A 02/10/2016   Procedure: ESOPHAGOGASTRODUODENOSCOPY (EGD) WITH PROPOFOL;  Surgeon: Bernette Redbird, MD;  Location: [facility];  Service: Endoscopy;  Laterality: N/A;   KIDNEY SURGERY  1966   BILATERAL URETER'S DILATATION   LUMBAR LAMINECTOMY/DECOMPRESSION MICRODISCECTOMY  05/18/2011   Procedure:  LUMBAR LAMINECTOMY/DECOMPRESSION MICRODISCECTOMY;  Surgeon: Javier Docker;  Location: [facility];  Service: Orthopedics;  Laterality: Right;  Decompression Lumbar 4-Lumbar 5  Right    (xray)    LUMBAR LAMINECTOMY/DECOMPRESSION MICRODISCECTOMY N/A 05/06/2021   Procedure: Microlumbar decompression L5-S1;  Surgeon: Jene Every, MD;  Location: [facility];  Service: Orthopedics;  Laterality: N/A;  3 C-bed 90 mins   MELANOMA EXCISION WITH SENTINEL LYMPH NODE BIOPSY  2001   moh's procedure/  RIGHT FOREHEAD AND UPPER EYEBROW   RIGHT LATERAL PAROTIDECTOMY W/ NERVE DISSECTION / RIGHT MODIFIED RADICAL NECK DISSECTION SPARING SCM ELEVENTH NERVE AND INTERNAL JUGULAR VEIN  09-12-2006  DR DWIGHT BATES   DR DWIGHT BATES; "inside gland; lots of lymph nodes"    Social History   Socioeconomic History   Marital status: Married    Spouse name: Not on file   Number of children: Not on file   Years of education: Not on file   Highest education level: Not on file  Occupational History   Occupation: Conservation officer, nature at [employer]  Tobacco Use   Smoking status: Never   Smokeless tobacco: Never  Vaping Use   Vaping status: Never Used  Substance and Sexual Activity   Alcohol use: Not Currently    Comment: occasional- 1 drink per month   Drug use: Never   Sexual activity: Not on file  Other Topics Concern   Not on file  Social History Narrative   Right handed   Caffeine 3 cups daily   Lives one story home with basement 1 time weekl   Social Determinants of Health   Financial Resource Strain: Not on file  Food Insecurity: Not on file  Transportation Needs: Not on file  Physical Activity: Not on file  Stress: Not on file  Social Connections: Not on file  Intimate Partner Violence: Not on file    Outpatient Encounter Medications as of 02/20/2023  Medication Sig   acetaminophen (TYLENOL) 500 MG tablet Take 1,000 mg by mouth every 6 (six) hours as needed for moderate pain.   calcium carbonate (TUMS - DOSED IN MG  ELEMENTAL CALCIUM) 500 MG chewable tablet Chew 1,000 mg by mouth daily as needed for indigestion or heartburn.   carvedilol (COREG) 3.125 MG tablet Take 1 tablet (3.125 mg total) by mouth 2 (two) times daily with a meal.   docusate sodium (COLACE) 100 MG capsule Take 1 capsule (100 mg total) by mouth 2 (two) times daily as needed for mild constipation.   famotidine (PEPCID) 40 MG tablet Take 40 mg by mouth daily.   HYDROcodone-acetaminophen (NORCO) 5-325 MG tablet Take 1 tablet by mouth every 6 (six) hours as needed for moderate pain.   lidocaine (LIDODERM) 5 % Place 1 patch onto the skin daily as needed (pain).   lisinopril (ZESTRIL) 2.5 MG tablet Take 1 tablet (2.5 mg total) by mouth every morning.   loratadine-pseudoephedrine (CLARITIN-D 24-HOUR) 10-240 MG 24 hr tablet Take 1 tablet by mouth as needed for allergies.   nitroGLYCERIN (NITROSTAT) 0.4 MG SL tablet Place 1 tablet (0.4 mg total) under the tongue every 5 (five) minutes as needed for chest pain. DISSOLVE ONE TABLET UNDER THE TONGUE EVERY 5 MINUTES AS NEEDED FOR CHEST PAIN.  DO NOT EXCEED A TOTAL OF 3 DOSES IN 15 MINUTES   NON FORMULARY headache relief with acetamimophen, aspirin   nortriptyline (PAMELOR) 10 MG capsule Take 3 capsules (30 mg total) by mouth at bedtime.   pantoprazole (PROTONIX) 40 MG tablet Take 40 mg by mouth 2 (two) times daily.   polyethylene glycol (MIRALAX / GLYCOLAX) 17 g packet Take 17 g by mouth daily.   silver sulfADIAZINE (SILVADENE) 1 % cream Apply 1 application topically daily. Apply to shoulder nightly 7-14 days   tamsulosin (FLOMAX) 0.4 MG CAPS capsule Take 1 capsule (0.4 mg total) by mouth daily.   atorvastatin (LIPITOR) 40 MG tablet Take 1 tablet (40 mg total) by mouth daily.   nitrofurantoin, macrocrystal-monohydrate, (MACROBID) 100 MG capsule Take 1 capsule (100 mg total) by mouth 2 (two) times daily. 1 po BId (Patient not taking: Reported on 02/20/2023)   No facility-administered encounter medications on  file as of 02/20/2023.   Allergies  Allergen Reactions   Cephalexin Shortness Of Breath and Rash   Lorazepam Shortness Of Breath and Other (See Comments)    She may be very sensitive to benzo.    Stops breathing    Promethazine     Felt strange, "knocked me out"   Seasonal Ic [Octacosanol]  Other (See Comments)   Review of Systems As per HPI  Objective:  BP 127/83   Pulse 88   Temp 98.3 F (36.8 C)   Ht 5\' 4"  (1.626 m)   Wt 196 lb (88.9 kg)   LMP 05/18/2011   SpO2 94%   BMI 33.64 kg/m    Wt Readings from Last 3 Encounters:  02/20/23 196 lb (88.9 kg)  02/08/23 196 lb 6.4 oz (89.1 kg)  01/19/23 196 lb (88.9 kg)   Physical Exam Constitutional:      General: She is awake. She is not in acute distress.    Appearance: Normal appearance. She is well-developed and well-groomed. She is not ill-appearing, toxic-appearing or diaphoretic.  Cardiovascular:     Rate and Rhythm: Normal rate.     Pulses: Normal pulses.          Radial pulses are 2+ on the right side and 2+ on the left side.       Posterior tibial pulses are 2+ on the right side and 2+ on the left side.     Heart sounds: Normal heart sounds. No murmur heard.    No gallop.  Pulmonary:     Effort: Pulmonary effort is normal. No respiratory distress.     Breath sounds: Normal breath sounds. No stridor. No wheezing, rhonchi or rales.  Abdominal:     General: Abdomen is flat. Bowel sounds are normal. There is no distension.     Palpations: Abdomen is soft.     Tenderness: There is no abdominal tenderness. There is no right CVA tenderness or left CVA tenderness.  Musculoskeletal:     Cervical back: Full passive range of motion without pain and neck supple.     Right lower leg: No edema.     Left lower leg: No edema.  Skin:    General: Skin is warm.     Capillary Refill: Capillary refill takes less than 2 seconds.  Neurological:     General: No focal deficit present.     Mental Status: She is alert, oriented to  person, place, and time and easily aroused. Mental status is at baseline.     GCS: GCS eye subscore is 4. GCS verbal subscore is 5. GCS motor subscore is 6.     Motor: No weakness.  Psychiatric:        Attention and Perception: Attention and perception normal.        Mood and Affect: Mood and affect normal.        Speech: Speech normal.        Behavior: Behavior normal. Behavior is cooperative.        Thought Content: Thought content normal. Thought content does not include homicidal or suicidal ideation. Thought content does not include homicidal or suicidal plan.        Cognition and Memory: Cognition and memory normal.        Judgment: Judgment normal.     Results for orders placed or performed in visit on 02/08/23  Urine Culture   Specimen: Urine   UR  Result Value Ref Range   Urine Culture, Routine Final report    Organism ID, Bacteria Comment   Microscopic Examination   Urine  Result Value Ref Range   WBC, UA 6-10 (A) 0 - 5 /hpf   RBC, Urine 0-2 0 - 2 /hpf   Epithelial Cells (non renal) 0-10 0 - 10 /hpf   Renal Epithel, UA None seen None seen /hpf  Bacteria, UA Few (A) None seen/Few   Yeast, UA None seen None seen  Urinalysis, Complete  Result Value Ref Range   Specific Gravity, UA 1.020 1.005 - 1.030   pH, UA 7.0 5.0 - 7.5   Color, UA Yellow Yellow   Appearance Ur Clear Clear   Leukocytes,UA 2+ (A) Negative   Protein,UA Negative Negative/Trace   Glucose, UA Negative Negative   Ketones, UA Negative Negative   RBC, UA Trace (A) Negative   Bilirubin, UA Negative Negative   Urobilinogen, Ur 0.2 0.2 - 1.0 mg/dL   Nitrite, UA Negative Negative   Microscopic Examination See below:           02/20/2023   10:48 AM 02/08/2023    2:40 PM 03/31/2022    2:38 PM 02/03/2022   11:20 AM 12/29/2021    1:46 PM  Depression screen PHQ 2/9  Decreased Interest  0 0 0 0  Down, Depressed, Hopeless 0 0 0 0 0  PHQ - 2 Score 0 0 0 0 0  Altered sleeping 0  0 0 0  Tired, decreased  energy 1  0 0 0  Change in appetite 0  0 0 0  Feeling bad or failure about yourself  0  0 0 0  Trouble concentrating 0  0 0 0  Moving slowly or fidgety/restless 0  0 0 0  Suicidal thoughts 0  0 0 0  PHQ-9 Score 1  0 0 0  Difficult doing work/chores Not difficult at all  Not difficult at all Not difficult at all Not difficult at all       02/20/2023   10:48 AM 03/31/2022    2:38 PM 12/29/2021    1:46 PM 11/16/2021    9:28 AM  GAD 7 : Generalized Anxiety Score  Nervous, Anxious, on Edge 0 0 0 0  Control/stop worrying 0 0 0 0  Worry too much - different things 0 0 0 0  Trouble relaxing 0 0 0 0  Restless 0 0 0 0  Easily annoyed or irritable 0 0 0 0  Afraid - awful might happen 0 0 0 0  Total GAD 7 Score 0 0 0 0  Anxiety Difficulty Not difficult at all Not difficult at all Not difficult at all Not difficult at all   Pertinent labs & imaging results that were available during my care of the patient were reviewed by me and considered in my medical decision making.  Assessment & Plan:  Jahaida was seen today for dysuria.  Diagnoses and all orders for this visit:  Flank pain Labs as below. Will communicate results to patient once available. Will await results to determine next steps.  -     Urine Culture  Dysuria Labs as below. Will communicate results to patient once available. Will await results to determine next steps.  Based on results, will wait until culture results to treat.  -     Urinalysis, Routine w reflex microscopic  Abdominal pain, unspecified abdominal location Given patient significant history of stones and continued symptoms, will complete imaging as below. Due to body habitus, prefer CT over xray for stone detection.  Patient to follow up with urology.  -     CT RENAL STONE STUDY; Future    Continue all other maintenance medications.  Follow up plan: Return if symptoms worsen or fail to improve.  Continue healthy lifestyle choices, including diet (rich in  fruits, vegetables, and lean proteins, and low in salt and  simple carbohydrates) and exercise (at least 30 minutes of moderate physical activity daily).  Written and verbal instructions provided   The above assessment and management plan was discussed with the patient. The patient verbalized understanding of and has agreed to the management plan. Patient is aware to call the clinic if they develop any new symptoms or if symptoms persist or worsen. Patient is aware when to return to the clinic for a follow-up visit. Patient educated on when it is appropriate to go to the emergency department.   Neale Burly, DNP-FNP [facility] [address] [place], [place] [address] [phone]

## 2023-02-21 ENCOUNTER — Telehealth: Payer: Self-pay | Admitting: *Deleted

## 2023-02-21 NOTE — Telephone Encounter (Signed)
Patient called back about your labs. She wants to know what a urologist is going  to be able to do to help with her pain.  She states that she feels constant nausea and that the  pain is getting worse.  She states it hurts the most when she lies down.

## 2023-02-22 LAB — URINE CULTURE

## 2023-02-22 NOTE — Progress Notes (Signed)
Negative for UTI

## 2023-02-22 NOTE — Telephone Encounter (Signed)
Pt has been notified.

## 2023-02-23 ENCOUNTER — Telehealth: Payer: Self-pay | Admitting: Family Medicine

## 2023-02-23 NOTE — Telephone Encounter (Signed)
Left message with husband can take ibuprofen stated he would tell the wife she is at work

## 2023-02-23 NOTE — Telephone Encounter (Signed)
Pt aware the culture is negative Pt wants to know what she can take for pain since provider told her not to take tylenol. Pt wants to let provider know that her sxs no better. Please call back. Pt asked for a detailed message because she is at work and cannot have her phone with her.

## 2023-02-28 ENCOUNTER — Telehealth: Payer: Self-pay | Admitting: Family Medicine

## 2023-02-28 DIAGNOSIS — Z1211 Encounter for screening for malignant neoplasm of colon: Secondary | ICD-10-CM

## 2023-02-28 NOTE — Telephone Encounter (Signed)
Left message with pts husband.  Pt will call back tomorrow since she is currently at work.  He states that pt has been having some problems and would like an urgent referral placed to [facility] preferably since pts husband has a history there.  When pt makes [place] aware of her symptoms will get advise from Dettinger or whether she needs to be seen in our office first prior to referral.

## 2023-02-28 NOTE — Telephone Encounter (Signed)
Pt says she needs Dr Louanne Skye to send a new referral for her to see Dr Dulce Sellar or first available for [facility] because the provider she was seeing, has retired/left.

## 2023-03-01 NOTE — Telephone Encounter (Signed)
Pt is having choking sensation when she eats terrible indigestion that feels like heart attack and terrible acid reflux.

## 2023-03-02 ENCOUNTER — Other Ambulatory Visit: Payer: Self-pay

## 2023-03-02 DIAGNOSIS — K219 Gastro-esophageal reflux disease without esophagitis: Secondary | ICD-10-CM

## 2023-03-02 DIAGNOSIS — R1310 Dysphagia, unspecified: Secondary | ICD-10-CM

## 2023-03-02 NOTE — Telephone Encounter (Signed)
Urgent referral placed. Husband made aware.

## 2023-03-02 NOTE — Telephone Encounter (Signed)
Go ahead and place referral to GI, Gerd diagnosis

## 2023-03-03 ENCOUNTER — Other Ambulatory Visit: Payer: Self-pay | Admitting: Cardiovascular Disease

## 2023-03-05 ENCOUNTER — Other Ambulatory Visit: Payer: Self-pay | Admitting: Family

## 2023-03-05 ENCOUNTER — Ambulatory Visit: Payer: BC Managed Care – PPO | Admitting: Family

## 2023-03-05 ENCOUNTER — Encounter: Payer: Self-pay | Admitting: Family

## 2023-03-05 VITALS — BP 112/78 | HR 87 | Temp 97.6°F | Ht 64.0 in | Wt 198.6 lb

## 2023-03-05 DIAGNOSIS — J069 Acute upper respiratory infection, unspecified: Secondary | ICD-10-CM | POA: Diagnosis not present

## 2023-03-05 DIAGNOSIS — J029 Acute pharyngitis, unspecified: Secondary | ICD-10-CM | POA: Diagnosis not present

## 2023-03-05 LAB — VERITOR FLU A/B WAIVED
Influenza A: NEGATIVE
Influenza B: NEGATIVE

## 2023-03-05 LAB — RAPID STREP SCREEN (MED CTR MEBANE ONLY): Strep Gp A Ag, IA W/Reflex: NEGATIVE

## 2023-03-05 LAB — CULTURE, GROUP A STREP

## 2023-03-05 MED ORDER — CETIRIZINE HCL 10 MG PO TABS
10.0000 mg | ORAL_TABLET | Freq: Every day | ORAL | 1 refills | Status: DC
Start: 2023-03-05 — End: 2023-08-01

## 2023-03-05 MED ORDER — FLUTICASONE PROPIONATE 50 MCG/ACT NA SUSP: 2.0000 | Freq: Every day | NASAL | 6 refills | Status: DC

## 2023-03-05 NOTE — Patient Instructions (Signed)

## 2023-03-05 NOTE — Progress Notes (Signed)
Subjective:    Patient ID: [name], female    DOB: 1960-09-26, 62 y.o.   MRN: [id number]  Chief Complaint  Patient presents with   Sore Throat   Pt presents to the office today with sore throat that started yesterday. 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   She also has a GI referral pending for dysphagia and GERD.  Sore Throat  This is a new problem. The current episode started yesterday. The problem has been unchanged. There has been no fever. The pain is at a severity of 7/10. The pain is mild. Associated symptoms include congestion, ear pain, swollen glands and trouble swallowing. Pertinent negatives include no coughing, headaches or shortness of breath. She has tried acetaminophen for the symptoms. The treatment provided mild relief.      Review of Systems  HENT:  Positive for congestion, ear pain and trouble swallowing.   Respiratory:  Negative for cough and shortness of breath.   Neurological:  Negative for headaches.  All other systems reviewed and are negative.      Objective:   Physical Exam Vitals reviewed.  Constitutional:      General: She is not in acute distress.    Appearance: She is well-developed.  HENT:     Head: Normocephalic and atraumatic.     Right Ear: External ear normal.     Mouth/Throat:     Pharynx: Posterior oropharyngeal erythema present. No pharyngeal swelling or oropharyngeal exudate.  Eyes:     Pupils: Pupils are equal, round, and reactive to light.  Neck:     Thyroid: No thyromegaly.  Cardiovascular:     Rate and Rhythm: Normal rate and regular rhythm.     Heart sounds: Normal heart sounds. No murmur heard. Pulmonary:     Effort: Pulmonary effort is normal. No respiratory distress.     Breath sounds: Normal breath sounds. No wheezing.  Abdominal:     General: Bowel sounds are normal. There is no distension.     Palpations: Abdomen is soft.     Tenderness: There is no  abdominal tenderness.  Musculoskeletal:        General: No tenderness. Normal range of motion.     Cervical back: Normal range of motion and neck supple.  Skin:    General: Skin is warm and dry.  Neurological:     Mental Status: She is alert and oriented to person, place, and time.     Cranial Nerves: No cranial nerve deficit.     Deep Tendon Reflexes: Reflexes are normal and symmetric.  Psychiatric:        Behavior: Behavior normal.        Thought Content: Thought content normal.        Judgment: Judgment normal.      BP (!) 139/95   Pulse 87   Temp 97.6 F (36.4 C) (Temporal)   Ht 5\' 4"  (1.626 m)   Wt 198 lb 9.6 oz (90.1 kg)   LMP 05/18/2011   SpO2 98%   BMI 34.09 kg/m       Assessment & Plan:  [name] comes in today with chief complaint of Sore Throat   Diagnosis and orders addressed:  1. Sore throat - Veritor Flu A/B Waived - Rapid Strep Screen ([facility] ONLY) - Novel Coronavirus, NAA ([facility])  2. Viral URI - Take meds as prescribed - Use a cool mist humidifier  -Use saline  nose sprays frequently -Force fluids -For any cough or congestion  Use plain Mucinex- regular strength or max strength is fine -For fever or aces or pains- take tylenol or ibuprofen. -Throat lozenges if help -New toothbrush in 3 days Follow up if symptoms worsen or do not improve  - fluticasone (FLONASE) 50 MCG/ACT nasal spray; Place 2 sprays into both nostrils daily.  Dispense: 16 g; Refill: 6 - cetirizine (ZYRTEC ALLERGY) 10 MG tablet; Take 1 tablet (10 mg total) by mouth daily.  Dispense: 90 tablet; Refill: 1      Jannifer Rodney, FNP

## 2023-03-06 ENCOUNTER — Ambulatory Visit: Payer: BC Managed Care – PPO

## 2023-03-06 LAB — NOVEL CORONAVIRUS, NAA: SARS-CoV-2, NAA: NOT DETECTED

## 2023-03-12 ENCOUNTER — Telehealth: Payer: Self-pay | Admitting: Family Medicine

## 2023-03-12 NOTE — Telephone Encounter (Signed)
Pt called to see if Dr Louanne Skye can do anything to get her seen sooner for an endoscopy? Says the office we referred her to can't see her until November and pt needs to be seen asap because she says she feels like she is choking everytime she eats. Can we send referral as STAT?

## 2023-03-12 NOTE — Telephone Encounter (Signed)
Unfortunately I do not think they will take it as a stat because it has been more of a chronic issue but what she can do is call the office and asked to be put on there cancellation list and see if they get any openings before then.  I do not know that we can do anything else on our end to accelerate the process.

## 2023-03-13 NOTE — Telephone Encounter (Signed)
Per pts husband pt has an earlier appt scheduled.

## 2023-03-13 NOTE — Telephone Encounter (Signed)
Pt r/c ok leave voicemail

## 2023-03-22 ENCOUNTER — Other Ambulatory Visit: Payer: Self-pay | Admitting: Cardiovascular Disease

## 2023-03-27 ENCOUNTER — Emergency Department (HOSPITAL_COMMUNITY): Payer: BC Managed Care – PPO

## 2023-03-27 ENCOUNTER — Emergency Department (HOSPITAL_COMMUNITY)
Admission: EM | Admit: 2023-03-27 | Discharge: 2023-03-27 | Disposition: A | Discharge status: Home / Self Care | Payer: BC Managed Care – PPO | Attending: Emergency Medicine | Admitting: Emergency Medicine

## 2023-03-27 ENCOUNTER — Other Ambulatory Visit: Payer: Self-pay

## 2023-03-27 ENCOUNTER — Encounter (HOSPITAL_COMMUNITY): Payer: Self-pay

## 2023-03-27 DIAGNOSIS — I509 Heart failure, unspecified: Secondary | ICD-10-CM | POA: Diagnosis not present

## 2023-03-27 DIAGNOSIS — R600 Localized edema: Secondary | ICD-10-CM | POA: Diagnosis not present

## 2023-03-27 DIAGNOSIS — M47812 Spondylosis without myelopathy or radiculopathy, cervical region: Secondary | ICD-10-CM | POA: Diagnosis not present

## 2023-03-27 DIAGNOSIS — Z79899 Other long term (current) drug therapy: Secondary | ICD-10-CM | POA: Diagnosis not present

## 2023-03-27 DIAGNOSIS — M79604 Pain in right leg: Secondary | ICD-10-CM

## 2023-03-27 DIAGNOSIS — M7989 Other specified soft tissue disorders: Secondary | ICD-10-CM | POA: Diagnosis not present

## 2023-03-27 DIAGNOSIS — C760 Malignant neoplasm of head, face and neck: Secondary | ICD-10-CM | POA: Diagnosis not present

## 2023-03-27 DIAGNOSIS — R109 Unspecified abdominal pain: Secondary | ICD-10-CM | POA: Diagnosis not present

## 2023-03-27 DIAGNOSIS — R1310 Dysphagia, unspecified: Secondary | ICD-10-CM | POA: Diagnosis not present

## 2023-03-27 DIAGNOSIS — D72819 Decreased white blood cell count, unspecified: Secondary | ICD-10-CM | POA: Insufficient documentation

## 2023-03-27 DIAGNOSIS — R1031 Right lower quadrant pain: Secondary | ICD-10-CM | POA: Diagnosis not present

## 2023-03-27 DIAGNOSIS — J029 Acute pharyngitis, unspecified: Secondary | ICD-10-CM | POA: Diagnosis not present

## 2023-03-27 DIAGNOSIS — I110 Hypertensive heart disease with heart failure: Secondary | ICD-10-CM | POA: Insufficient documentation

## 2023-03-27 DIAGNOSIS — M79605 Pain in left leg: Secondary | ICD-10-CM | POA: Diagnosis not present

## 2023-03-27 DIAGNOSIS — I6523 Occlusion and stenosis of bilateral carotid arteries: Secondary | ICD-10-CM | POA: Diagnosis not present

## 2023-03-27 LAB — URINALYSIS, ROUTINE W REFLEX MICROSCOPIC
Bilirubin Urine: NEGATIVE
Glucose, UA: NEGATIVE mg/dL
Ketones, ur: NEGATIVE mg/dL
Nitrite: NEGATIVE
Protein, ur: NEGATIVE mg/dL
Specific Gravity, Urine: 1.005 (ref 1.005–1.030)
pH: 6 (ref 5.0–8.0)

## 2023-03-27 LAB — CBC
HCT: 41.2 % (ref 36.0–46.0)
Hemoglobin: 13.9 g/dL (ref 12.0–15.0)
MCH: 31.9 pg (ref 26.0–34.0)
MCHC: 33.7 g/dL (ref 30.0–36.0)
MCV: 94.5 fL (ref 80.0–100.0)
Platelets: 157 10*3/uL (ref 150–400)
RBC: 4.36 MIL/uL (ref 3.87–5.11)
RDW: 12 % (ref 11.5–15.5)
WBC: 3.5 10*3/uL — ABNORMAL LOW (ref 4.0–10.5)
nRBC: 0 % (ref 0.0–0.2)

## 2023-03-27 LAB — BASIC METABOLIC PANEL
Anion gap: 8 (ref 5–15)
BUN: 8 mg/dL (ref 8–23)
CO2: 30 mmol/L (ref 22–32)
Calcium: 9.2 mg/dL (ref 8.9–10.3)
Chloride: 100 mmol/L (ref 98–111)
Creatinine, Ser: 0.75 mg/dL (ref 0.44–1.00)
GFR, Estimated: >60 mL/min (ref >60)
Glucose, Bld: 94 mg/dL (ref 70–99)
Potassium: 4 mmol/L (ref 3.5–5.1)
Sodium: 138 mmol/L (ref 135–145)

## 2023-03-27 LAB — TROPONIN I (HIGH SENSITIVITY): Troponin I (High Sensitivity): 2 ng/L (ref <18)

## 2023-03-27 MED ORDER — IOHEXOL 300 MG/ML  SOLN
75.0000 mL | Freq: Once | INTRAMUSCULAR | Status: AC | PRN
  Administered 2023-03-27: 75 mL via INTRAVENOUS

## 2023-03-27 MED ORDER — OXYCODONE HCL 5 MG PO TABS
5.0000 mg | ORAL_TABLET | Freq: Four times a day (QID) | ORAL | 0 refills | Status: DC | PRN
Start: 2023-03-27 — End: 2023-03-27

## 2023-03-27 MED ORDER — OXYCODONE HCL 5 MG PO TABS
5.0000 mg | ORAL_TABLET | Freq: Four times a day (QID) | ORAL | 0 refills | Status: DC | PRN
Start: 2023-03-27 — End: 2023-08-27

## 2023-03-27 NOTE — ED Provider Notes (Signed)
[place] EMERGENCY DEPARTMENT AT [facility] Provider Note   CSN: [id number] Arrival date & time: 03/27/23  1121     History  Chief Complaint  Patient presents with   Flank Pain   Nausea    [name] is a 61 y.o. female with a complicated past medical history including hypertension, hyperlipidemia, CHF, non-STEMI in 2016, IBS, history of kidney stones, GERD and history of parotid cancer requiring chemotherapy and radiation, resolved, presenting with multiple complaints.  First, she endorses bilateral flank pain along with nausea which has been present for approximately 6 weeks.  She has seen her primary MD for this and had a CT scan 4 weeks ago which did not pick up kidney stones.  She also has dysuria and had been placed on a course of antibiotics and is also tried Azo but with no symptom relief.  Of note, her urine culture was negative for infection per review of her chart with her PCP.  She also is having increasing difficulty with swallowing and describes a subtle chronic sore throat, she denies having to regurgitate or vomit her food however.  She has noticed a hoarse quality to her voice.  Had been on a PPI to rule out reflux, was not helpful so she stopped taking.  She is scheduled to see her gastroenterologist next week.  Also states she has lost her sense of taste and smell in recent weeks.  She has been tested for COVID and strep throat, both negative.  She is concerned about possible recurrence of her cancer, she is scheduled to see Dr. Jenne Pane with a ENT but not until November.  She has also developed pain and swelling in her left leg and has had several hard knots form at her medial left calf, this symptom has been present for several months.  She denies injury such as hematoma to the sites.  She also has some scattered erythematous patches on her anterior left lower leg.  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   She denies chest pain, shortness of breath, vomiting, abdominal pain.  No fevers.  The history is provided by the patient.       Home Medications Prior to Admission medications   Medication Sig Start Date End Date Taking? Authorizing Provider  acetaminophen (TYLENOL) 500 MG tablet Take 1,000 mg by mouth every 6 (six) hours as needed for moderate pain.    [provider]  atorvastatin (LIPITOR) 40 MG tablet Take 1 tablet by mouth once daily 03/05/23   Wendall Stade, MD  calcium carbonate (TUMS - DOSED IN MG ELEMENTAL CALCIUM) 500 MG chewable tablet Chew 1,000 mg by mouth daily as needed for indigestion or heartburn.    [provider]  carvedilol (COREG) 3.125 MG tablet Take 1 tablet (3.125 mg total) by mouth 2 (two) times daily with a meal. Please call [phone] to schedule an appointment with Dr. Charlton Haws for future refills. Thank you. 1st attempt. 03/22/23   Wendall Stade, MD  cetirizine (ZYRTEC ALLERGY) 10 MG tablet Take 1 tablet (10 mg total) by mouth daily. 03/05/23   Junie Spencer, FNP  docusate sodium (COLACE) 100 MG capsule Take 1 capsule (100 mg total) by mouth 2 (two) times daily as needed for mild constipation. 05/06/21   Jene Every, MD  famotidine (PEPCID) 40 MG tablet Take 40 mg by mouth daily.    [provider]  fluticasone (FLONASE) 50 MCG/ACT nasal spray Place 2 sprays into both nostrils daily. 03/05/23   Jannifer Rodney A, FNP  lidocaine (LIDODERM) 5 % Place 1 patch onto the skin daily as needed (pain). 01/04/21   [provider]  lisinopril (ZESTRIL) 2.5 MG tablet Take 1 tablet (2.5 mg total) by mouth every morning. Please call [phone] to schedule an appointment with Dr. Charlton Haws for future refills. Thank you. 1st attempt. 03/22/23   Wendall Stade, MD  loratadine-pseudoephedrine (CLARITIN-D 24-HOUR) 10-240 MG 24 hr tablet Take 1 tablet by mouth as needed for allergies.    [provider]  nitrofurantoin,  macrocrystal-monohydrate, (MACROBID) 100 MG capsule Take 1 capsule (100 mg total) by mouth 2 (two) times daily. 1 po BId 02/08/23   Jannifer Rodney A, FNP  nitroGLYCERIN (NITROSTAT) 0.4 MG SL tablet Place 1 tablet (0.4 mg total) under the tongue every 5 (five) minutes as needed for chest pain. DISSOLVE ONE TABLET UNDER THE TONGUE EVERY 5 MINUTES AS NEEDED FOR CHEST PAIN.  DO NOT EXCEED A TOTAL OF 3 DOSES IN 15 MINUTES 02/08/22   Wendall Stade, MD  NON FORMULARY headache relief with acetamimophen, aspirin    [provider]  nortriptyline (PAMELOR) 10 MG capsule Take 3 capsules (30 mg total) by mouth at bedtime. 01/31/23 01/31/24  Antony Madura, MD  oxyCODONE (ROXICODONE) 5 MG immediate release tablet Take 1 tablet (5 mg total) by mouth every 6 (six) hours as needed for severe pain. 03/27/23   Burgess Amor, PA-C  pantoprazole (PROTONIX) 40 MG tablet Take 40 mg by mouth 2 (two) times daily.    [provider]  polyethylene glycol (MIRALAX / GLYCOLAX) 17 g packet Take 17 g by mouth daily. 11/16/21   Dettinger, Elige Radon, MD  silver sulfADIAZINE (SILVADENE) 1 % cream Apply 1 application topically daily. Apply to shoulder nightly 7-14 days 02/22/21   Janalyn Harder, MD  tamsulosin (FLOMAX) 0.4 MG CAPS capsule Take 1 capsule (0.4 mg total) by mouth daily. 03/06/22   Gabriel Earing, FNP      Allergies    Cephalexin, Lorazepam, Promethazine, and Seasonal ic [octacosanol]    Review of Systems   Review of Systems  Constitutional:  Negative for chills and fever.  HENT:  Positive for sore throat, trouble swallowing and voice change. Negative for congestion and facial swelling.   Eyes: Negative.   Respiratory:  Negative for chest tightness and shortness of breath.   Cardiovascular:  Positive for leg swelling. Negative for chest pain.  Gastrointestinal:  Positive for nausea. Negative for abdominal pain and vomiting.  Genitourinary:  Positive for dysuria and flank pain. Negative for hematuria.   Musculoskeletal:  Positive for back pain. Negative for arthralgias, joint swelling and neck pain.  Skin: Negative.  Negative for rash and wound.  Neurological:  Negative for dizziness, weakness, light-headedness, numbness and headaches.  Psychiatric/Behavioral: Negative.      Physical Exam Updated Vital Signs BP (!) 170/68   Pulse 84   Temp 98.5 F (36.9 C)   Resp 18   Wt 90.7 kg   LMP 05/18/2011   SpO2 96%   BMI 34.33 kg/m  Physical Exam Vitals and nursing note reviewed.  Constitutional:      Appearance: She is well-developed.  HENT:     Head: Normocephalic and atraumatic.  Eyes:     Conjunctiva/sclera: Conjunctivae normal.  Neck:     Comments: Right SCM muscle appears hypertrophied, nontender.  Patient endorses chronic from her radiation  treatments Cardiovascular:     Rate and Rhythm: Normal rate and regular rhythm.     Heart sounds: Normal heart sounds.  Pulmonary:     Effort: Pulmonary effort is normal.     Breath sounds: Normal breath sounds. No wheezing.  Abdominal:     General: Bowel sounds are normal. There is no distension.     Palpations: Abdomen is soft.     Tenderness: There is no abdominal tenderness. There is no right CVA tenderness, left CVA tenderness or guarding.  Musculoskeletal:        General: Normal range of motion.     Cervical back: Normal range of motion.     Left lower leg: Swelling present.     Comments: Trace pitting edema to mid tibia left leg.  There are 2 distinct subcutaneous areas of induration medial left calf, each measuring approximately 2 cm in greatest diameter.  She does have some soft nontender distended varicose veins both posterior calf and lateral fibular area.  Dorsalis pedis pulses full.  No skin erythema at the site of these indurated findings.  Skin:    General: Skin is warm and dry.  Neurological:     Mental Status: She is alert.     ED Results / Procedures / Treatments   Labs (all labs ordered are listed, but only  abnormal results are displayed) Labs Reviewed  URINALYSIS, ROUTINE W REFLEX MICROSCOPIC - Abnormal; Notable for the following components:      Result Value   Color, Urine STRAW (*)    Hgb urine dipstick SMALL (*)    Leukocytes,Ua MODERATE (*)    Bacteria, UA RARE (*)    All other components within normal limits  CBC - Abnormal; Notable for the following components:   WBC 3.5 (*)    All other components within normal limits  URINE CULTURE  BASIC METABOLIC PANEL  TROPONIN I (HIGH SENSITIVITY)    EKG None  Radiology CT Soft Tissue Neck W Contrast  Result Date: 03/27/2023 CLINICAL DATA:  Head and neck cancer. Monitor. Worsening dysphagia. Loss of taste and smell history of parotid cancer. EXAM: CT NECK WITH CONTRAST TECHNIQUE: Multidetector CT imaging of the neck was performed using the standard protocol following the bolus administration of intravenous contrast. RADIATION DOSE REDUCTION: This exam was performed according to the departmental dose-optimization program which includes automated exposure control, adjustment of the mA and/or kV according to patient size and/or use of iterative reconstruction technique. CONTRAST:  75mL OMNIPAQUE IOHEXOL 300 MG/ML  SOLN COMPARISON:  08/08/2021 CT.  PET scan 04/27/2022. FINDINGS: Pharynx and larynx: No evidence of mucosal or submucosal lesion. Salivary glands: Left parotid and submandibular glands are normal. Right parotid gland is surgically absent. Question right submandibular resection versus post radiation atrophy. No sign of mass lesion. Thyroid: Diminutive thyroid tissue on the right. No evidence of thyroid mass. Lymph nodes: No lymphadenopathy on either side of the neck. Normal cervical chain nodes on the left. Vascular: Atherosclerotic change at both carotid bifurcations. No acute vascular finding. Jugular veins are patent. Limited intracranial: Normal Visualized orbits: Normal Mastoids and visualized paranasal sinuses: Normal Skeleton: Ordinary  mid cervical spondylosis. Upper chest: Lung apices are clear. No evidence of mass or active inflammation. Other: Some scarring in the region of the right neck particularly affecting the sternocleidomastoid muscle. No finding that would suggest recurrent malignancy. IMPRESSION: 1. No evidence of recurrent or metastatic disease. 2. Surgical absence of the right parotid gland. Question right submandibular resection versus post radiation  atrophy. 3. No abnormality seen to explain the clinical presentation. 4. Atherosclerotic change at both carotid bifurcations. No acute vascular finding. 5. Some scarring in the region of the right neck particularly affecting the sternocleidomastoid muscle. No finding that would suggest recurrent malignancy. Electronically Signed   By: Paulina Fusi M.D.   On: 03/27/2023 18:32   DG Tibia/Fibula Left  Result Date: 03/27/2023 CLINICAL DATA:  Left lower extremity pain and edema. EXAM: LEFT TIBIA AND FIBULA - 2 VIEW COMPARISON:  Left lower extremity venous Doppler ultrasound-earlier same day FINDINGS: There is diffuse soft tissue swelling about the lower leg. No fracture or dislocation. Limited visualization of the adjacent knee and ankle is normal given obliquity and large field of view. Tubular opacities within the subcutaneous tissues about the lateral aspect of the left knee may represent varicosities. No radiopaque foreign body. IMPRESSION: 1. Diffuse soft tissue swelling about the lower leg without underlying fracture or dislocation. 2. Tubular opacities within the subcutaneous tissues about the lateral aspect of the left knee may represent varicosities. Note, there was no evidence of DVT or SVT on preceding venous Doppler ultrasound. Electronically Signed   By: Simonne Come M.D.   On: 03/27/2023 17:22   US Venous Img Lower Unilateral Left (DVT)  Result Date: 03/27/2023 CLINICAL DATA:  Left lower extremity pain and edema. Evaluate for DVT. EXAM: LEFT LOWER EXTREMITY VENOUS  DOPPLER ULTRASOUND TECHNIQUE: Gray-scale sonography with graded compression, as well as color Doppler and duplex ultrasound were performed to evaluate the lower extremity deep venous systems from the level of the common femoral vein and including the common femoral, femoral, profunda femoral, popliteal and calf veins including the posterior tibial, peroneal and gastrocnemius veins when visible. The superficial great saphenous vein was also interrogated. Spectral Doppler was utilized to evaluate flow at rest and with distal augmentation maneuvers in the common femoral, femoral and popliteal veins. COMPARISON:  None Available. FINDINGS: Contralateral Common Femoral Vein: Respiratory phasicity is normal and symmetric with the symptomatic side. No evidence of thrombus. Normal compressibility. Common Femoral Vein: No evidence of thrombus. Normal compressibility, respiratory phasicity and response to augmentation. Saphenofemoral Junction: No evidence of thrombus. Normal compressibility and flow on color Doppler imaging. Profunda Femoral Vein: No evidence of thrombus. Normal compressibility and flow on color Doppler imaging. Femoral Vein: No evidence of thrombus. Normal compressibility, respiratory phasicity and response to augmentation. Popliteal Vein: No evidence of thrombus. Normal compressibility, respiratory phasicity and response to augmentation. Calf Veins: No evidence of thrombus. Normal compressibility and flow on color Doppler imaging. Superficial Great Saphenous Vein: No evidence of thrombus. Normal compressibility. Other Findings:  None. IMPRESSION: No evidence of DVT within the left lower extremity. Electronically Signed   By: Simonne Come M.D.   On: 03/27/2023 17:20    Procedures Procedures    Medications Ordered in ED Medications  iohexol (OMNIPAQUE) 300 MG/ML solution 75 mL (75 mLs Intravenous Contrast Given 03/27/23 1746)    ED Course/ Medical Decision Making/ A&P                                  Medical Decision Making Patient presenting with multiple complaints, all of unclear etiology.  Given her persistent flank pain but negative CT imaging via her primary provider, along with dysuria but no UTI raise is concern of possible musculoskeletal source, especially given her pain is reproducible with palpation across her left lower paralumbar area.  She has no  midline tenderness.  The swelling in her left lower leg is concerning for possible DVT, this was ruled out via ultrasound imaging today.  She does have some significant varicosities, she may benefit from more frequent leg elevation, perhaps compression stockings.  She was very concerned about possibility of recurrence of her throat cancer, with her multiple symptoms, CT imaging was completed and she was given reassurance that there is no evidence that she has a recurrence of this.  She is encouraged to keep her appointment with ENT, she also has follow-up with her gastroenterologist next week.  Her urinalysis today does show small amount hemoglobin, moderate leukocytes, rare bacteria we discussed these findings, but have decided to wait for urine culture results prior to repeating antibiotics.  She is prescribed oxycodone for her back pain which she endorses keeps her awake at night.  She is given caution regarding sedation and to use sparingly.  Plan follow-up with her specialist as she has already scheduled.  Amount and/or Complexity of Data Reviewed Labs: ordered.    Details: Labs are reassuring, given her nausea a troponin was collected given her past cardiac history and is negative.  Be met normal, CBC normal except for a mild leukopenia at 3.5 Radiology: ordered.    Details: Imaging reviewed and as mentioned above no acute findings, specifically no left lower extremity DVT, CT soft tissue neck is reassuring for no mass or obstruction.  Risk Prescription drug management.           Final Clinical Impression(s) / ED  Diagnoses Final diagnoses:  Sore throat  Flank pain  Lower extremity pain, anterior, right    Rx / DC Orders ED Discharge Orders          Ordered    oxyCODONE (ROXICODONE) 5 MG immediate release tablet  Every 6 hours PRN,   Status:  Discontinued        03/27/23 1916    oxyCODONE (ROXICODONE) 5 MG immediate release tablet  Every 6 hours PRN        03/27/23 1924              Victoriano Lain 03/28/23 1241    Gloris Manchester, MD 04/02/23 [phone]

## 2023-03-27 NOTE — ED Triage Notes (Signed)
C/o bilateral flank pain with nausea x 6 weeks. CT scan 4 weeks ago. Urinary frequency and burning with urination.  Pt also c/o not having taste or smell and left lower extremity pain near the calf. No heat or swelling or redness noted.  Hx kidney stones

## 2023-03-27 NOTE — Discharge Instructions (Signed)
As discussed your workup tonight is reassuring with no significant findings.  Your CT scan of your neck does not show any recurrence of your cancer.  Your ultrasound of your left leg is negative for blood clot.  You are being prescribed pain medication to help you with your symptoms until you can be seen by your specialist.  Do not drive within 4 hours of taking oxycodone as this medication will make you drowsy.

## 2023-03-27 NOTE — ED Notes (Signed)
Lab made aware of urine culture add-on.  

## 2023-03-28 LAB — URINE CULTURE
Culture: NO GROWTH
Special Requests: NORMAL

## 2023-04-06 DIAGNOSIS — R1310 Dysphagia, unspecified: Secondary | ICD-10-CM | POA: Diagnosis not present

## 2023-04-06 DIAGNOSIS — K219 Gastro-esophageal reflux disease without esophagitis: Secondary | ICD-10-CM | POA: Diagnosis not present

## 2023-04-12 DIAGNOSIS — M545 Low back pain, unspecified: Secondary | ICD-10-CM | POA: Diagnosis not present

## 2023-04-12 DIAGNOSIS — M791 Myalgia, unspecified site: Secondary | ICD-10-CM | POA: Diagnosis not present

## 2023-04-13 ENCOUNTER — Telehealth: Payer: Self-pay | Admitting: Family Medicine

## 2023-04-13 ENCOUNTER — Encounter: Payer: Self-pay | Admitting: Family Medicine

## 2023-04-13 ENCOUNTER — Ambulatory Visit (INDEPENDENT_AMBULATORY_CARE_PROVIDER_SITE_OTHER): Payer: BC Managed Care – PPO | Admitting: Family Medicine

## 2023-04-13 VITALS — BP 127/83 | HR 98 | Ht 64.0 in | Wt 194.0 lb

## 2023-04-13 DIAGNOSIS — E782 Mixed hyperlipidemia: Secondary | ICD-10-CM | POA: Diagnosis not present

## 2023-04-13 DIAGNOSIS — Z0000 Encounter for general adult medical examination without abnormal findings: Secondary | ICD-10-CM

## 2023-04-13 DIAGNOSIS — I700 Atherosclerosis of aorta: Secondary | ICD-10-CM

## 2023-04-13 DIAGNOSIS — Z0001 Encounter for general adult medical examination with abnormal findings: Secondary | ICD-10-CM

## 2023-04-13 DIAGNOSIS — I10 Essential (primary) hypertension: Secondary | ICD-10-CM | POA: Diagnosis not present

## 2023-04-13 DIAGNOSIS — Z923 Personal history of irradiation: Secondary | ICD-10-CM

## 2023-04-13 LAB — LIPID PANEL
Chol/HDL Ratio: 3.3 ratio (ref 0.0–4.4)
Cholesterol, Total: 232 mg/dL — ABNORMAL HIGH (ref 100–199)
HDL: 70 mg/dL (ref >39)
LDL Chol Calc (NIH): 125 mg/dL — ABNORMAL HIGH (ref 0–99)
Triglycerides: 211 mg/dL — ABNORMAL HIGH (ref 0–149)
VLDL Cholesterol Cal: 37 mg/dL (ref 5–40)

## 2023-04-13 LAB — CBC WITH DIFFERENTIAL/PLATELET
Basophils Absolute: 0 10*3/uL (ref 0.0–0.2)
Basos: 0 %
EOS (ABSOLUTE): 0 10*3/uL (ref 0.0–0.4)
Eos: 0 %
Hematocrit: 43.6 % (ref 34.0–46.6)
Hemoglobin: 14.5 g/dL (ref 11.1–15.9)
Immature Grans (Abs): 0 10*3/uL (ref 0.0–0.1)
Immature Granulocytes: 0 %
Lymphocytes Absolute: 0.9 10*3/uL (ref 0.7–3.1)
Lymphs: 8 %
MCH: 32.1 pg (ref 26.6–33.0)
MCHC: 33.3 g/dL (ref 31.5–35.7)
MCV: 97 fL (ref 79–97)
Monocytes Absolute: 0.5 10*3/uL (ref 0.1–0.9)
Monocytes: 4 %
Neutrophils Absolute: 10.1 10*3/uL — ABNORMAL HIGH (ref 1.4–7.0)
Neutrophils: 88 %
Platelets: 234 10*3/uL (ref 150–450)
RBC: 4.52 x10E6/uL (ref 3.77–5.28)
RDW: 12.6 % (ref 11.7–15.4)
WBC: 11.6 10*3/uL — ABNORMAL HIGH (ref 3.4–10.8)

## 2023-04-13 LAB — CMP14+EGFR
ALT: 26 [IU]/L (ref 0–32)
AST: 23 [IU]/L (ref 0–40)
Albumin: 4.8 g/dL (ref 3.9–4.9)
Alkaline Phosphatase: 104 [IU]/L (ref 44–121)
BUN/Creatinine Ratio: 14 (ref 12–28)
BUN: 11 mg/dL (ref 8–27)
Bilirubin Total: 0.5 mg/dL (ref 0.0–1.2)
CO2: 21 mmol/L (ref 20–29)
Calcium: 9.7 mg/dL (ref 8.7–10.3)
Chloride: 102 mmol/L (ref 96–106)
Creatinine, Ser: 0.77 mg/dL (ref 0.57–1.00)
Globulin, Total: 2.4 g/dL (ref 1.5–4.5)
Glucose: 111 mg/dL — ABNORMAL HIGH (ref 70–99)
Potassium: 4 mmol/L (ref 3.5–5.2)
Sodium: 140 mmol/L (ref 134–144)
Total Protein: 7.2 g/dL (ref 6.0–8.5)
eGFR: 86 mL/min/{1.73_m2} (ref >59)

## 2023-04-13 LAB — TSH: TSH: 1.53 u[IU]/mL (ref 0.450–4.500)

## 2023-04-13 MED ORDER — ATORVASTATIN CALCIUM 40 MG PO TABS: 40.0000 mg | ORAL_TABLET | Freq: Every day | ORAL | 3 refills | Status: DC

## 2023-04-13 NOTE — Progress Notes (Signed)
BP 127/83   Pulse 98   Ht 5\' 4"  (1.626 m)   Wt 194 lb (88 kg)   LMP 05/18/2011   SpO2 98%   BMI 33.30 kg/m    Subjective:   Patient ID: [name], female    DOB: 09/25/1960, 62 y.o.   MRN: [id number]  HPI: [name] is a 62 y.o. female presenting on 04/13/2023 for Medical Management of Chronic Issues (CPE, no pap)   HPI Adult well exam Patient is coming in for physical exam today.  She is still having some congestion and drainage and esophagitis.  She is seeing gastroenterology and then has an appointment coming up with ENT.  She has an endoscopy scheduled for dysphagia coming up at the beginning of next month.  Hypertension Patient is currently on Carvedilol and lisinopril, and their blood pressure today is 127/83. Patient denies any lightheadedness or dizziness. Patient denies headaches, blurred vision, chest pains, shortness of breath, or weakness. Denies any side effects from medication and is content with current medication.   Hyperlipidemia Patient is coming in for recheck of his hyperlipidemia. The patient is currently taking atorvastatin. They deny any issues with myalgias or history of liver damage from it. They deny any focal numbness or weakness or chest pain.   Relevant past medical, surgical, family and social history reviewed and updated as indicated. Interim medical history since our last visit reviewed. Allergies and medications reviewed and updated.  Review of Systems  Constitutional:  Negative for chills and fever.  Eyes:  Negative for redness and visual disturbance.  Respiratory:  Negative for chest tightness and shortness of breath.   Cardiovascular:  Negative for chest pain and leg swelling.  Genitourinary:  Negative for difficulty urinating and dysuria.  Musculoskeletal:  Negative for back pain and gait problem.  Skin:  Negative for rash.  Neurological:  Negative for dizziness, light-headedness and headaches.  Psychiatric/Behavioral:   Negative for agitation and behavioral problems.   All other systems reviewed and are negative.   Per HPI unless specifically indicated above   Allergies as of 04/13/2023       Reactions   Cephalexin Shortness Of Breath, Rash   Lorazepam Shortness Of Breath, Other (See Comments)   She may be very sensitive to benzo.    Stops breathing    Promethazine    Felt strange, "knocked me out"   Seasonal Ic [octacosanol] Other (See Comments)        Medication List        Accurate as of April 13, 2023  9:54 AM. If you have any questions, ask your nurse or doctor.          STOP taking these medications    acetaminophen 500 MG tablet Commonly known as: TYLENOL Stopped by: Elige Radon Marthena Whitmyer   famotidine 40 MG tablet Commonly known as: PEPCID Stopped by: Elige Radon Yurika Pereda   nitrofurantoin (macrocrystal-monohydrate) 100 MG capsule Commonly known as: Macrobid Stopped by: Elige Radon Gesselle Fitzsimons   pantoprazole 40 MG tablet Commonly known as: PROTONIX Stopped by: Elige Radon Mackinzie Vuncannon   tamsulosin 0.4 MG Caps capsule Commonly known as: FLOMAX Stopped by: Elige Radon Mehr Depaoli       TAKE these medications    atorvastatin 40 MG tablet Commonly known as: LIPITOR Take 1 tablet (40 mg total) by mouth daily.   calcium carbonate 500 MG chewable tablet Commonly known as: TUMS - dosed in mg elemental calcium Chew 1,000 mg by mouth daily as needed for indigestion  or heartburn.   carvedilol 3.125 MG tablet Commonly known as: COREG Take 1 tablet (3.125 mg total) by mouth 2 (two) times daily with a meal. Please call [phone] to schedule an appointment with Dr. Charlton Haws for future refills. Thank you. 1st attempt.   cetirizine 10 MG tablet Commonly known as: ZyrTEC Allergy Take 1 tablet (10 mg total) by mouth daily.   D-3-5 PO Take 1 Dose by mouth daily.   docusate sodium 100 MG capsule Commonly known as: Colace Take 1 capsule (100 mg total) by mouth 2 (two) times daily  as needed for mild constipation.   fluticasone 50 MCG/ACT nasal spray Commonly known as: FLONASE Place 2 sprays into both nostrils daily.   ibuprofen 200 MG tablet Commonly known as: ADVIL Take 200 mg by mouth every 6 (six) hours as needed.   lidocaine 5 % Commonly known as: LIDODERM Place 1 patch onto the skin daily as needed (pain).   lisinopril 2.5 MG tablet Commonly known as: ZESTRIL Take 1 tablet (2.5 mg total) by mouth every morning. Please call [phone] to schedule an appointment with Dr. Charlton Haws for future refills. Thank you. 1st attempt.   loratadine-pseudoephedrine 10-240 MG 24 hr tablet Commonly known as: CLARITIN-D 24-hour Take 1 tablet by mouth as needed for allergies.   milk thistle 175 MG tablet Take 175 mg by mouth daily.   nitroGLYCERIN 0.4 MG SL tablet Commonly known as: NITROSTAT Place 1 tablet (0.4 mg total) under the tongue every 5 (five) minutes as needed for chest pain. DISSOLVE ONE TABLET UNDER THE TONGUE EVERY 5 MINUTES AS NEEDED FOR CHEST PAIN.  DO NOT EXCEED A TOTAL OF 3 DOSES IN 15 MINUTES   NON FORMULARY headache relief with acetamimophen, aspirin   nortriptyline 10 MG capsule Commonly known as: PAMELOR Take 3 capsules (30 mg total) by mouth at bedtime.   oxyCODONE 5 MG immediate release tablet Commonly known as: Roxicodone Take 1 tablet (5 mg total) by mouth every 6 (six) hours as needed for severe pain.   polyethylene glycol 17 g packet Commonly known as: MIRALAX / GLYCOLAX Take 17 g by mouth daily.   silver sulfADIAZINE 1 % cream Commonly known as: Silvadene Apply 1 application topically daily. Apply to shoulder nightly 7-14 days         Objective:   BP 127/83   Pulse 98   Ht 5\' 4"  (1.626 m)   Wt 194 lb (88 kg)   LMP 05/18/2011   SpO2 98%   BMI 33.30 kg/m   Wt Readings from Last 3 Encounters:  04/13/23 194 lb (88 kg)  03/27/23 200 lb (90.7 kg)  03/05/23 198 lb 9.6 oz (90.1 kg)    Physical Exam Vitals and  nursing note reviewed.  Constitutional:      General: She is not in acute distress.    Appearance: She is well-developed. She is not diaphoretic.  Eyes:     Conjunctiva/sclera: Conjunctivae normal.  Cardiovascular:     Rate and Rhythm: Normal rate and regular rhythm.     Heart sounds: Normal heart sounds. No murmur heard. Pulmonary:     Effort: Pulmonary effort is normal. No respiratory distress.     Breath sounds: Normal breath sounds. No wheezing.  Musculoskeletal:        General: No swelling or tenderness. Normal range of motion.  Skin:    General: Skin is warm and dry.     Findings: No rash.  Neurological:     Mental Status:  She is alert and oriented to person, place, and time.     Coordination: Coordination normal.  Psychiatric:        Behavior: Behavior normal.       Assessment & Plan:   Problem List Items Addressed This Visit       Cardiovascular and Mediastinum   Essential (primary) hypertension (Chronic)   Relevant Medications   atorvastatin (LIPITOR) 40 MG tablet   Other Relevant Orders   CBC with Differential/Platelet   CMP14+EGFR   Lipid panel   TSH   Aortic atherosclerosis (HCC)   Relevant Medications   atorvastatin (LIPITOR) 40 MG tablet   Other Relevant Orders   CBC with Differential/Platelet   Lipid panel     Other   Hyperlipidemia (Chronic)   Relevant Medications   atorvastatin (LIPITOR) 40 MG tablet   Other Relevant Orders   CBC with Differential/Platelet   CMP14+EGFR   Lipid panel   TSH   Other Visit Diagnoses     Physical exam    -  Primary   Relevant Orders   CBC with Differential/Platelet   CMP14+EGFR   Lipid panel   TSH   History of head and neck radiation       Relevant Orders   TSH     Continue current medicine, she will follow-up with ENT and gastroenterology and see if they can help with her congestion and GERD symptoms.  Likely due to history of radiation to the head and neck.  Follow up plan: Return in about 6  months (around 10/12/2023), or if symptoms worsen or fail to improve, for Hypertension and hyperlipidemia recheck.  Counseling provided for all of the vaccine components Orders Placed This Encounter  Procedures   CBC with Differential/Platelet   CMP14+EGFR   Lipid panel   TSH    Arville Care, MD [facility] 04/13/2023, 9:54 AM

## 2023-04-16 NOTE — Telephone Encounter (Signed)
Left detailed message with pharmacy to fax over patient vaccine record.

## 2023-04-19 ENCOUNTER — Encounter: Payer: BC Managed Care – PPO | Admitting: Family Medicine

## 2023-05-25 ENCOUNTER — Encounter: Payer: Self-pay | Admitting: Nurse Practitioner

## 2023-05-25 ENCOUNTER — Ambulatory Visit: Payer: BC Managed Care – PPO | Admitting: Nurse Practitioner

## 2023-05-25 VITALS — BP 181/81 | HR 82 | Temp 97.5°F | Ht 64.0 in | Wt 195.0 lb

## 2023-05-25 DIAGNOSIS — M545 Low back pain, unspecified: Secondary | ICD-10-CM | POA: Diagnosis not present

## 2023-05-25 DIAGNOSIS — R300 Dysuria: Secondary | ICD-10-CM | POA: Diagnosis not present

## 2023-05-25 LAB — URINALYSIS, COMPLETE
Bilirubin, UA: NEGATIVE
Glucose, UA: NEGATIVE
Ketones, UA: NEGATIVE
Nitrite, UA: NEGATIVE
Protein,UA: NEGATIVE
RBC, UA: NEGATIVE
Specific Gravity, UA: 1.025 (ref 1.005–1.030)
Urobilinogen, Ur: 1 mg/dL (ref 0.2–1.0)
pH, UA: 5.5 (ref 5.0–7.5)

## 2023-05-25 LAB — MICROSCOPIC EXAMINATION
Bacteria, UA: NONE SEEN
Renal Epithel, UA: NONE SEEN /[HPF]
Yeast, UA: NONE SEEN

## 2023-05-25 NOTE — Patient Instructions (Signed)
Acute Back Pain, Adult Acute back pain is sudden and usually short-lived. It is often caused by an injury to the muscles and tissues in the back. The injury may result from: A muscle, tendon, or ligament getting overstretched or torn. Ligaments are tissues that connect bones to each other. Lifting something improperly can cause a back strain. Wear and tear (degeneration) of the spinal disks. Spinal disks are circular tissue that provide cushioning between the bones of the spine (vertebrae). Twisting motions, such as while playing sports or doing yard work. A hit to the back. Arthritis. You may have a physical exam, lab tests, and imaging tests to find the cause of your pain. Acute back pain usually goes away with rest and home care. Follow these instructions at home: Managing pain, stiffness, and swelling Take over-the-counter and prescription medicines only as told by your health care provider. Treatment may include medicines for pain and inflammation that are taken by mouth or applied to the skin, or muscle relaxants. Your health care provider may recommend applying ice during the first 24-48 hours after your pain starts. To do this: Put ice in a plastic bag. Place a towel between your skin and the bag. Leave the ice on for 20 minutes, 2-3 times a day. Remove the ice if your skin turns bright red. This is very important. If you cannot feel pain, heat, or cold, you have a greater risk of damage to the area. If directed, apply heat to the affected area as often as told by your health care provider. Use the heat source that your health care provider recommends, such as a moist heat pack or a heating pad. Place a towel between your skin and the heat source. Leave the heat on for 20-30 minutes. Remove the heat if your skin turns bright red. This is especially important if you are unable to feel pain, heat, or cold. You have a greater risk of getting burned. Activity  Do not stay in bed. Staying in  bed for more than 1-2 days can delay your recovery. Sit up and stand up straight. Avoid leaning forward when you sit or hunching over when you stand. If you work at a desk, sit close to it so you do not need to lean over. Keep your chin tucked in. Keep your neck drawn back, and keep your elbows bent at a 90-degree angle (right angle). Sit high and close to the steering wheel when you drive. Add lower back (lumbar) support to your car seat, if needed. Take short walks on even surfaces as soon as you are able. Try to increase the length of time you walk each day. Do not sit, drive, or stand in one place for more than 30 minutes at a time. Sitting or standing for long periods of time can put stress on your back. Do not drive or use heavy machinery while taking prescription pain medicine. Use proper lifting techniques. When you bend and lift, use positions that put less stress on your back: Bend your knees. Keep the load close to your body. Avoid twisting. Exercise regularly as told by your health care provider. Exercising helps your back heal faster and helps prevent back injuries by keeping muscles strong and flexible. Work with a physical therapist to make a safe exercise program, as recommended by your health care provider. Do any exercises as told by your physical therapist. Lifestyle Maintain a healthy weight. Extra weight puts stress on your back and makes it difficult to have good   posture. Avoid activities or situations that make you feel anxious or stressed. Stress and anxiety increase muscle tension and can make back pain worse. Learn ways to manage anxiety and stress, such as through exercise. General instructions Sleep on a firm mattress in a comfortable position. Try lying on your side with your knees slightly bent. If you lie on your back, put a pillow under your knees. Keep your head and neck in a straight line with your spine (neutral position) when using electronic equipment like  smartphones or pads. To do this: Raise your smartphone or pad to look at it instead of bending your head or neck to look down. Put the smartphone or pad at the level of your face while looking at the screen. Follow your treatment plan as told by your health care provider. This may include: Cognitive or behavioral therapy. Acupuncture or massage therapy. Meditation or yoga. Contact a health care provider if: You have pain that is not relieved with rest or medicine. You have increasing pain going down into your legs or buttocks. Your pain does not improve after 2 weeks. You have pain at night. You lose weight without trying. You have a fever or chills. You develop nausea or vomiting. You develop abdominal pain. Get help right away if: You develop new bowel or bladder control problems. You have unusual weakness or numbness in your arms or legs. You feel faint. These symptoms may represent a serious problem that is an emergency. Do not wait to see if the symptoms will go away. Get medical help right away. Call your local emergency services (911 in the [place] Do not drive yourself to the hospital. Summary Acute back pain is sudden and usually short-lived. Use proper lifting techniques. When you bend and lift, use positions that put less stress on your back. Take over-the-counter and prescription medicines only as told by your health care provider, and apply heat or ice as told. This information is not intended to replace advice given to you by your health care provider. Make sure you discuss any questions you have with your health care provider. Document Revised: 08/27/2020 Document Reviewed: 08/27/2020 Elsevier Patient Education  2024 [employer]  

## 2023-05-25 NOTE — Progress Notes (Signed)
   Subjective:    Patient ID: [name], female    DOB: 1961-05-03, 62 y.o.   MRN: [id number]   Chief Complaint: Dysuria   Dysuria  This is a new problem. The current episode started in the past 7 days. The problem occurs intermittently. The problem has been waxing and waning. The quality of the pain is described as burning. The pain is at a severity of 7/10. The pain is mild. There has been no fever. The fever has been present for Less than 1 day. She is Not sexually active. There is No history of pyelonephritis. Associated symptoms include frequency, hesitancy and urgency. Associated symptoms comments: Malodorous urine. She has tried nothing for the symptoms. The treatment provided no relief. Her past medical history is significant for recurrent UTIs.    Patient Active Problem List   Diagnosis Date Noted   Spinal stenosis at L4-L5 level 05/06/2021   Chronic midline low back pain with sciatica 08/10/2020   Osteoarthritis of right knee 08/14/2019   Obesity (BMI 30.0-34.9) 01/03/2018   Aortic atherosclerosis (HCC) 02/04/2016   Hyperlipidemia 12/26/2015   Essential (primary) hypertension 12/26/2015   Cardiomyopathy (HCC)    ICM- EF 30-35% at cath 12/30/14-improved to 45% by echo 01/06/15 01/06/2015   Orthostatic hypotension 01/06/2015   IBS (irritable bowel syndrome) 01/05/2015   Biliary dyskinesia 01/05/2015   Recent NSTEMI (Taktosubo event 12/30/14) 12/29/2014   GERD (gastroesophageal reflux disease) 03/21/2012       Review of Systems  Genitourinary:  Positive for dysuria, frequency, hesitancy and urgency.       Objective:   Physical Exam Constitutional:      Appearance: Normal appearance.  Cardiovascular:     Rate and Rhythm: Normal rate and regular rhythm.     Heart sounds: Normal heart sounds.  Pulmonary:     Breath sounds: Normal breath sounds.  Skin:    General: Skin is warm.  Neurological:     General: No focal deficit present.     Mental Status: She is  alert and oriented to person, place, and time.  Psychiatric:        Mood and Affect: Mood normal.        Behavior: Behavior normal.     BP (!) 181/81   Pulse 82   Temp (!) 97.5 F (36.4 C) (Temporal)   Ht 5\' 4"  (1.626 m)   Wt 195 lb (88.5 kg)   LMP 05/18/2011   SpO2 98%   BMI 33.47 kg/m   Urine clear      Assessment & Plan:   [name] in today with chief complaint of Dysuria   1. Dysuria Urine clear - Urinalysis, Complete - Urine Culture  2. Acute midline low back pain without sciatica Moist heat Rest Patient declines any meds    The above assessment and management plan was discussed with the patient. The patient verbalized understanding of and has agreed to the management plan. Patient is aware to call the clinic if symptoms persist or worsen. Patient is aware when to return to the clinic for a follow-up visit. Patient educated on when it is appropriate to go to the emergency department.   Mary-Margaret Daphine Deutscher, FNP

## 2023-05-28 LAB — URINE CULTURE

## 2023-05-30 ENCOUNTER — Other Ambulatory Visit: Payer: Self-pay | Admitting: *Deleted

## 2023-05-30 DIAGNOSIS — R300 Dysuria: Secondary | ICD-10-CM

## 2023-05-31 ENCOUNTER — Other Ambulatory Visit: Payer: BC Managed Care – PPO

## 2023-05-31 DIAGNOSIS — R300 Dysuria: Secondary | ICD-10-CM

## 2023-06-01 LAB — URINE CULTURE

## 2023-06-04 ENCOUNTER — Other Ambulatory Visit: Payer: Self-pay

## 2023-06-04 MED ORDER — CARVEDILOL 3.125 MG PO TABS: 3.1250 mg | ORAL_TABLET | Freq: Two times a day (BID) | ORAL | 0 refills | Status: DC

## 2023-06-08 ENCOUNTER — Other Ambulatory Visit: Payer: Self-pay | Admitting: Nurse Practitioner

## 2023-06-08 MED ORDER — SULFAMETHOXAZOLE-TRIMETHOPRIM 800-160 MG PO TABS: 1.0000 | ORAL_TABLET | Freq: Two times a day (BID) | ORAL | 0 refills | Status: DC

## 2023-06-08 NOTE — Progress Notes (Signed)
Still having dysuria- will treat with antibiotic and see if improves. Urine and culture were good. Meds ordered this encounter  Medications   sulfamethoxazole-trimethoprim (BACTRIM DS) 800-160 MG tablet    Sig: Take 1 tablet by mouth 2 (two) times daily.    Dispense:  20 tablet    Refill:  0    Supervising Provider:   Arville Care A [[id number]]   Mary-Margaret Daphine Deutscher, FNP

## 2023-07-16 ENCOUNTER — Other Ambulatory Visit (HOSPITAL_COMMUNITY)
Admission: RE | Admit: 2023-07-16 | Discharge: 2023-07-16 | Disposition: A | Discharge status: Home / Self Care | Payer: BC Managed Care – PPO | Source: Ambulatory Visit | Attending: Family | Admitting: Family

## 2023-07-16 ENCOUNTER — Ambulatory Visit (INDEPENDENT_AMBULATORY_CARE_PROVIDER_SITE_OTHER): Payer: BC Managed Care – PPO | Admitting: Family

## 2023-07-16 ENCOUNTER — Encounter: Payer: Self-pay | Admitting: Family

## 2023-07-16 VITALS — BP 165/87 | HR 81 | Temp 97.0°F | Ht 64.0 in | Wt 192.0 lb

## 2023-07-16 DIAGNOSIS — Z0001 Encounter for general adult medical examination with abnormal findings: Secondary | ICD-10-CM | POA: Diagnosis not present

## 2023-07-16 DIAGNOSIS — Z01419 Encounter for gynecological examination (general) (routine) without abnormal findings: Secondary | ICD-10-CM | POA: Insufficient documentation

## 2023-07-16 DIAGNOSIS — Z0000 Encounter for general adult medical examination without abnormal findings: Secondary | ICD-10-CM | POA: Insufficient documentation

## 2023-07-16 DIAGNOSIS — R8290 Unspecified abnormal findings in urine: Secondary | ICD-10-CM | POA: Diagnosis not present

## 2023-07-16 LAB — URINALYSIS, COMPLETE
Bilirubin, UA: NEGATIVE
Glucose, UA: NEGATIVE
Ketones, UA: NEGATIVE
Nitrite, UA: NEGATIVE
Protein,UA: NEGATIVE
Specific Gravity, UA: 1.02 (ref 1.005–1.030)
Urobilinogen, Ur: 0.2 mg/dL (ref 0.2–1.0)
pH, UA: 5.5 (ref 5.0–7.5)

## 2023-07-16 LAB — CBC WITH DIFFERENTIAL/PLATELET
Basophils Absolute: 0 10*3/uL (ref 0.0–0.2)
Basos: 0 %
EOS (ABSOLUTE): 0.1 10*3/uL (ref 0.0–0.4)
Eos: 1 %
Hematocrit: 41.5 % (ref 34.0–46.6)
Hemoglobin: 13.8 g/dL (ref 11.1–15.9)
Immature Grans (Abs): 0.1 10*3/uL (ref 0.0–0.1)
Immature Granulocytes: 1 %
Lymphocytes Absolute: 1.7 10*3/uL (ref 0.7–3.1)
Lymphs: 19 %
MCH: 32.3 pg (ref 26.6–33.0)
MCHC: 33.3 g/dL (ref 31.5–35.7)
MCV: 97 fL (ref 79–97)
Monocytes Absolute: 0.6 10*3/uL (ref 0.1–0.9)
Monocytes: 7 %
Neutrophils Absolute: 6.2 10*3/uL (ref 1.4–7.0)
Neutrophils: 72 %
Platelets: 208 10*3/uL (ref 150–450)
RBC: 4.27 x10E6/uL (ref 3.77–5.28)
RDW: 12.9 % (ref 11.7–15.4)
WBC: 8.6 10*3/uL (ref 3.4–10.8)

## 2023-07-16 LAB — MICROSCOPIC EXAMINATION
Renal Epithel, UA: NONE SEEN /[HPF]
Yeast, UA: NONE SEEN

## 2023-07-16 LAB — CMP14+EGFR
ALT: 26 [IU]/L (ref 0–32)
AST: 18 [IU]/L (ref 0–40)
Albumin: 4.5 g/dL (ref 3.9–4.9)
Alkaline Phosphatase: 98 [IU]/L (ref 44–121)
BUN/Creatinine Ratio: 16 (ref 12–28)
BUN: 14 mg/dL (ref 8–27)
Bilirubin Total: 0.4 mg/dL (ref 0.0–1.2)
CO2: 24 mmol/L (ref 20–29)
Calcium: 9.4 mg/dL (ref 8.7–10.3)
Chloride: 102 mmol/L (ref 96–106)
Creatinine, Ser: 0.85 mg/dL (ref 0.57–1.00)
Globulin, Total: 1.8 g/dL (ref 1.5–4.5)
Glucose: 89 mg/dL (ref 70–99)
Potassium: 4 mmol/L (ref 3.5–5.2)
Sodium: 142 mmol/L (ref 134–144)
Total Protein: 6.3 g/dL (ref 6.0–8.5)
eGFR: 76 mL/min/{1.73_m2} (ref >59)

## 2023-07-16 LAB — LIPID PANEL
Chol/HDL Ratio: 2.7 {ratio} (ref 0.0–4.4)
Cholesterol, Total: 206 mg/dL — ABNORMAL HIGH (ref 100–199)
HDL: 76 mg/dL (ref >39)
LDL Chol Calc (NIH): 110 mg/dL — ABNORMAL HIGH (ref 0–99)
Triglycerides: 113 mg/dL (ref 0–149)
VLDL Cholesterol Cal: 20 mg/dL (ref 5–40)

## 2023-07-16 NOTE — Patient Instructions (Signed)
Health Maintenance After Age 65 After age 41, you are at a higher risk for certain long-term diseases and infections as well as injuries from falls. Falls are a major cause of broken bones and head injuries in people who are older than age 26. Getting regular preventive care can help to keep you healthy and well. Preventive care includes getting regular testing and making lifestyle changes as recommended by your health care provider. Talk with your health care provider about: Which screenings and tests you should have. A screening is a test that checks for a disease when you have no symptoms. A diet and exercise plan that is right for you. What should I know about screenings and tests to prevent falls? Screening and testing are the best ways to find a health problem early. Early diagnosis and treatment give you the best chance of managing medical conditions that are common after age 48. Certain conditions and lifestyle choices may make you more likely to have a fall. Your health care provider may recommend: Regular vision checks. Poor vision and conditions such as cataracts can make you more likely to have a fall. If you wear glasses, make sure to get your prescription updated if your vision changes. Medicine review. Work with your health care provider to regularly review all of the medicines you are taking, including over-the-counter medicines. Ask your health care provider about any side effects that may make you more likely to have a fall. Tell your health care provider if any medicines that you take make you feel dizzy or sleepy. Strength and balance checks. Your health care provider may recommend certain tests to check your strength and balance while standing, walking, or changing positions. Foot health exam. Foot pain and numbness, as well as not wearing proper footwear, can make you more likely to have a fall. Screenings, including: Osteoporosis screening. Osteoporosis is a condition that causes  the bones to get weaker and break more easily. Blood pressure screening. Blood pressure changes and medicines to control blood pressure can make you feel dizzy. Depression screening. You may be more likely to have a fall if you have a fear of falling, feel depressed, or feel unable to do activities that you used to do. Alcohol use screening. Using too much alcohol can affect your balance and may make you more likely to have a fall. Follow these instructions at home: Lifestyle Do not drink alcohol if: Your health care provider tells you not to drink. If you drink alcohol: Limit how much you have to: 0-1 drink a day for women. 0-2 drinks a day for men. Know how much alcohol is in your drink. In the [place] one drink equals one 12 oz bottle of beer (355 mL), one 5 oz glass of wine (148 mL), or one 1 oz glass of hard liquor (44 mL). Do not use any products that contain nicotine or tobacco. These products include cigarettes, chewing tobacco, and vaping devices, such as e-cigarettes. If you need help quitting, ask your health care provider. Activity  Follow a regular exercise program to stay fit. This will help you maintain your balance. Ask your health care provider what types of exercise are appropriate for you. If you need a cane or walker, use it as recommended by your health care provider. Wear supportive shoes that have nonskid soles. Safety  Remove any tripping hazards, such as rugs, cords, and clutter. Install safety equipment such as grab bars in bathrooms and safety rails on stairs. Keep rooms and walkways  well-lit. General instructions Talk with your health care provider about your risks for falling. Tell your health care provider if: You fall. Be sure to tell your health care provider about all falls, even ones that seem minor. You feel dizzy, tiredness (fatigue), or off-balance. Take over-the-counter and prescription medicines only as told by your health care provider. These include  supplements. Eat a healthy diet and maintain a healthy weight. A healthy diet includes low-fat dairy products, low-fat (lean) meats, and fiber from whole grains, beans, and lots of fruits and vegetables. Stay current with your vaccines. Schedule regular health, dental, and eye exams. Summary Having a healthy lifestyle and getting preventive care can help to protect your health and wellness after age 24. Screening and testing are the best way to find a health problem early and help you avoid having a fall. Early diagnosis and treatment give you the best chance for managing medical conditions that are more common for people who are older than age 81. Falls are a major cause of broken bones and head injuries in people who are older than age 75. Take precautions to prevent a fall at home. Work with your health care provider to learn what changes you can make to improve your health and wellness and to prevent falls. This information is not intended to replace advice given to you by your health care provider. Make sure you discuss any questions you have with your health care provider. Document Revised: 10/25/2020 Document Reviewed: 10/25/2020 Elsevier Patient Education  2024 [employer].

## 2023-07-16 NOTE — Progress Notes (Signed)
Subjective:    Patient ID: [name], female    DOB: 09/07/1960, 63 y.o.   MRN: [id number]  Chief Complaint  Patient presents with   Gynecologic Exam    Pt presents to the office today for CPE with pap. She is followed by Dr. Louanne Skye every 6 months.  Gynecologic Exam The patient's pertinent negatives include no genital itching, genital odor, vaginal bleeding or vaginal discharge. Associated symptoms include dysuria.  Hypertension  Dysuria  This is a new problem. The current episode started 1 to 4 weeks ago. The problem occurs intermittently. The problem has been unchanged. The quality of the pain is described as burning. The pain is at a severity of 5/10. The pain is mild. Associated symptoms comments: Urine odor .      Review of Systems  Genitourinary:  Positive for dysuria. Negative for vaginal discharge.  All other systems reviewed and are negative.      Objective:   Physical Exam Vitals reviewed.  Constitutional:      General: She is not in acute distress.    Appearance: She is well-developed.  HENT:     Head: Normocephalic and atraumatic.     Right Ear: Tympanic membrane normal.     Left Ear: Tympanic membrane normal.  Eyes:     Pupils: Pupils are equal, round, and reactive to light.  Neck:     Thyroid: No thyromegaly.  Cardiovascular:     Rate and Rhythm: Normal rate and regular rhythm.     Heart sounds: Normal heart sounds. No murmur heard. Pulmonary:     Effort: Pulmonary effort is normal. No respiratory distress.     Breath sounds: Normal breath sounds. No wheezing.  Abdominal:     General: Bowel sounds are normal. There is no distension.     Palpations: Abdomen is soft.     Tenderness: There is no abdominal tenderness.  Genitourinary:    Comments: Bimanual exam- no adnexal masses or tenderness, ovaries nonpalpable   Cervix parous and pink- No discharge  Musculoskeletal:        General: No tenderness. Normal range of motion.     Cervical  back: Normal range of motion and neck supple.  Skin:    General: Skin is warm and dry.  Neurological:     Mental Status: She is alert and oriented to person, place, and time.     Cranial Nerves: No cranial nerve deficit.     Deep Tendon Reflexes: Reflexes are normal and symmetric.  Psychiatric:        Behavior: Behavior normal.        Thought Content: Thought content normal.        Judgment: Judgment normal.       BP (!) 157/87   Pulse 81   Temp (!) 97 F (36.1 C) (Temporal)   Ht 5\' 4"  (1.626 m)   Wt 192 lb (87.1 kg)   LMP 05/18/2011   SpO2 95%   BMI 32.96 kg/m      Assessment & Plan:  [name] comes in today with chief complaint of Gynecologic Exam   Diagnosis and orders addressed:  1. Abnormal urine odor - Urine Culture - Urinalysis, Complete  2. Encounter for gynecological examination without abnormal finding - Cytology - PAP([facility])  3. Annual physical exam (Primary) - CMP14+EGFR - CBC with Differential/Platelet - Lipid panel - Cytology - PAP([facility])   Labs pending Continue current medications  Keep follow up with specialists  Health  Maintenance reviewed Diet and exercise encouraged  Follow up plan: Keep follow up with PCP   Jannifer Rodney, FNP

## 2023-07-18 ENCOUNTER — Telehealth: Payer: Self-pay | Admitting: Family Medicine

## 2023-07-18 DIAGNOSIS — R300 Dysuria: Secondary | ICD-10-CM

## 2023-07-18 LAB — URINE CULTURE

## 2023-07-18 LAB — CYTOLOGY - PAP: Diagnosis: NEGATIVE

## 2023-07-18 NOTE — Telephone Encounter (Signed)
Copied from CRM [id number]. Topic: Clinical - Medical Advice >> Jul 18, 2023 11:57 AM Clayton Bibles wrote: Reason for CRM: I read results to [name](LDL elevated- Continue Lipitor, low fat diet and exercise Urine negative Kidney and liver function stable CBC stable). She wants to know if her urine will be sent off for a culture. She is requesting a nurse to call her back about the back pain and the urine situation (pain and odor). She is asking if it would help to see her Urologist?

## 2023-07-18 NOTE — Progress Notes (Signed)
 NEUROLOGY FOLLOW UP OFFICE NOTE  [name] [id number]  Subjective:  [name] is a 63 y.o. 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  To briefly review: Initial consultation (04/11/22): Patient has a headache every day for the last 22 years (after skin cancer surgery). Some days it is worse then others. For the last 4-6 months it has been more concentrated around her right eyebrow/eye. Even touching this area causes a lot of pain. It is a throbbing pain, 7/10. The headache takes about 30 minutes to reach maximal intensity. She has to take Excedrin  every day (at least 2 doses per day). This helps for a while. Her headaches last hours. A nap will also help. She denies photophobia or phonophobia. She has a little nausea but is not sure this is related to headaches. Her headaches do not affect her function. She denies focal neurologic deficits before, during, or after headaches, including vision loss. Her headache does not change with position change.   She endorses long standing neck pain.   She mentioned seeing clear diamonds once in her vision prior to a headache last week. This has only happened once though.   She does not remember ever being prescribed a medication for headaches.   Patient also takes Norco for back pain and ?kidney stones.   She drinks 3 cups of coffee and 2 diet sodas per day.   She does not drink EtOH. She does not smoke.   Patient was seen by PCP and ENT. CT of maxillofacial and neck showed no abnormalities. She was prescribed Augmentin  for ear infection. This did not change headache symptoms.   07/21/22: B12 was low at 187. I recommended supplementation with B12 1000 mcg daily on 04/17/22. Patient is taking this.   Patient is now taking nortriptyline  20 mg  qhs. She has some dry mouth, but overall has no side effects. Since starting this medication she has not had any headaches. She has not had to take any Excedrin .   She reports no new complaints.   01/19/23: Patient continues to take Nortriptyline  20 mg at bedtime. She will have a headache every week to 10 day (3-4 per month). They are less severe when she does get a headache. When she gets a headache, she will take Tylenol  (2 tablets) (about 3-4 times per month. This will resolve headache in less than 2 hours.   She denies significant neck pain. She has had an increase in stressors. Her sleep is good. She does feel reduced energy and is not too active.   She is still taking B12.   Most recent Assessment and Plan (01/19/23): This is [name], a 63 y.o. female with episodic migraine without aura. She previously had contributions from medication overuse when she had daily headaches. Since starting Nortriptyline , her headaches are less frequent and less intense. Of late, she has had more headaches, about 4 per month that resolve quickly with Tylenol . I will increase her Nortriptyline  today to try to help with this.   She also has vit B12 that was low for which she is on supplementation. She reports low energy and fatigue, so I will also check vit D today.   Plan: -Blood work: vitamin D  -Continue B12 1000 mcg daily Migraine prevention:  Increase Nortriptyline   to 30 mg at bedtime Migraine rescue:  Patient doing well on rare Tylenol . Can continue this. Limit use of pain relievers to no more than 2 days out of week to prevent risk of rebound or medication-overuse headache. Encouraged to stay active Keep headache diary  Since their last visit: Vit D was low. I recommend 1000 international units daily. She is taking this.  She was taking B12 but has not taken it lately.  She is doing well in terms of headaches, she feels like she is doing well. She is currently having about 1 headache per  month that is very mild.  Current medications: -Nortriptyline  30 mg at bedtime -Tylenol  as needed  Side effects: None  Patient did have a fall on 06/20/23 when she lost balance putting on her socks. She had left knee pain and back pain. She is having an MRI on the knee next week.  MEDICATIONS:  Outpatient Encounter Medications as of 07/26/2023  Medication Sig   atorvastatin  (LIPITOR ) 40 MG tablet Take 1 tablet (40 mg total) by mouth daily.   calcium  carbonate (TUMS - DOSED IN MG ELEMENTAL CALCIUM ) 500 MG chewable tablet Chew 1,000 mg by mouth daily as needed for indigestion or heartburn.   carvedilol  (COREG ) 3.125 MG tablet Take 1 tablet (3.125 mg total) by mouth 2 (two) times daily with a meal.   cetirizine  (ZYRTEC  ALLERGY) 10 MG tablet Take 1 tablet (10 mg total) by mouth daily. (Patient taking differently: Take 10 mg by mouth as needed.)   Cholecalciferol  (D-3-5 PO) Take 1 Dose by mouth daily.   docusate sodium  (COLACE) 100 MG capsule Take 1 capsule (100 mg total) by mouth 2 (two) times daily as needed for mild constipation.   fluticasone  (FLONASE ) 50 MCG/ACT nasal spray Place 2 sprays into both nostrils daily. (Patient taking differently: Place 2 sprays into both nostrils as needed.)   gabapentin  (NEURONTIN ) 300 MG capsule Take 300 mg by mouth at bedtime.   ibuprofen (ADVIL) 200 MG tablet Take 200 mg by mouth every 6 (six) hours as needed.   lidocaine  (LIDODERM ) 5 % Place 1 patch onto the skin daily as needed (pain).   lisinopril  (ZESTRIL ) 2.5 MG tablet Take 1 tablet (2.5 mg total) by mouth every morning. Please call [phone] to schedule an appointment with Dr. Maude Emmer for future refills. Thank you. 1st attempt.   loratadine-pseudoephedrine (CLARITIN-D 24-HOUR) 10-240 MG 24 hr tablet Take 1 tablet by mouth as needed for allergies.   milk thistle 175 MG tablet Take 175 mg by mouth daily.   nitroGLYCERIN  (NITROSTAT ) 0.4 MG SL tablet Place 1 tablet (0.4 mg total) under the tongue  every 5 (five) minutes as needed for chest pain. DISSOLVE ONE TABLET UNDER THE TONGUE EVERY 5 MINUTES AS NEEDED FOR CHEST PAIN.  DO NOT EXCEED A TOTAL OF 3 DOSES IN 15 MINUTES   NON FORMULARY headache relief with acetamimophen, aspirin    nortriptyline  (PAMELOR ) 10 MG capsule Take 3 capsules (30 mg total) by mouth at bedtime.   oxyCODONE  (ROXICODONE ) 5 MG immediate release tablet Take 1 tablet (5 mg total) by mouth every 6 (six) hours as needed for severe pain.   polyethylene glycol (MIRALAX  / GLYCOLAX ) 17 g packet Take 17 g by mouth daily.   predniSONE  (DELTASONE ) 5 MG tablet Take 1 dose pk by oral route as directed for 6 days. (Patient not taking: Reported on 07/26/2023)   silver  sulfADIAZINE  (SILVADENE ) 1 % cream Apply 1 application topically daily. Apply to shoulder nightly 7-14  days (Patient taking differently: Apply 1 application  topically as needed. Apply to shoulder nightly 7-14 days)   No facility-administered encounter medications on file as of 07/26/2023.    PAST MEDICAL HISTORY: Past Medical History:  Diagnosis Date   Anxiety    Arthritis    back (12/30/2014)   Asthma    as a child, no problems as an adult, has an albuterol  inhaler   Cerebral cyst    per brain MRI 07-12-2019 unchanged 6mm cyst inferomedial right frontal lobe   Chronic headaches    Chronic systolic (congestive) heart failure (HCC)    followed by dr orlean   Complication of anesthesia    pt is very sensitive to benzodiazepines, pt stated almost died   Compression fracture of lumbar vertebra (HCC) 05/27/2020   per pt L3 and L4   DCM (dilated cardiomyopathy) (HCC)    w/ Takatsubo---  followed by dr delford---  stress-induced --- cardiac cath 12-30-2014 ef 30-35%, recovered per cardiac MRI 03-15-2015 ef  64%   Depression    Disorder of mastoid    per brain MRI 07-12-2019 persistant large mastoid effusion   Dysuria    Environmental and seasonal allergies    Frequency of urination    GERD (gastroesophageal reflux  disease)    History of asthma    childhood   History of esophageal spasm    History of kidney stones    History of melanoma excision 2001   right supraorbital (right forehead and upper eyelid )  s/p  Moh's procedure w/ sln bx,  no recurrence per pt   History of non-ST elevation myocardial infarction (NSTEMI) 12/29/2014   per cath normal coronaries , ef 30-35%---  stress-- induced Takotsubo syndrome    History of palpitations 01/05/2015   STRESS INDUCED   History of parotid cancer 2008   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-2006 s/p  right lateral parotidectomy w/ nerve dissection , modified radical neck dissection ;  completed  x35 fractions raditation 2008;  no recurrence per pt   Hyperlipidemia    Hypertension    Kidney cysts    Left ureteral calculus    Low back pain    Melanoma (HCC) 2001   face   Muscle spasm of back    Myocardial infarction (HCC) 2016   Pneumonia    as a child   PONV (postoperative nausea and vomiting)    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   Squamous cell carcinoma of skin 02/22/2021   in istu- right forehead ([id number])   Takotsubo syndrome 12/29/2014   cardiologist---  dr orlean--- dx NSTEMI -- stress-induced w/ DCM--  per cath 12-30-2014 normal coronaries , ef 30-35%,  recovered ef per cardiac MRI  ef 64%   Urgency of urination    Wears glasses     PAST SURGICAL HISTORY: Past Surgical History:  Procedure Laterality Date   BACK SURGERY     BREAST BIOPSY Bilateral early 2000's   CARDIAC CATHETERIZATION  2006 (APPROX)  [place]   NORMAL   CARDIAC CATHETERIZATION  12/30/2014   CARDIAC CATHETERIZATION N/A 12/30/2014   Procedure: Left Heart Cath and Coronary Angiography;  Surgeon: Deatrice DELENA Cage, MD;  Location: [facility];  Service: Cardiovascular;  Laterality: N/A;   CARDIOVASCULAR STRESS TEST  03-21-2012  DR DELFORD   NORMAL NUCLEAR STUDY/  NO ISCHEMIA/  EF 63%   COLONOSCOPY  10/2020   COLONOSCOPY  WITH PROPOFOL   05/29/2017  CYSTOSCOPY W/ URETERAL STENT PLACEMENT Left 03/26/2013   Procedure: CYSTOSCOPY WITH RETROGRADE PYELOGRAM ;  Surgeon: Toribio Neysa Repine, MD;  Location: [facility];  Service: Urology;  Laterality: Left;   CYSTOSCOPY WITH RETROGRADE PYELOGRAM, URETEROSCOPY AND STENT PLACEMENT Bilateral 03/19/2013   Procedure: CYSTOSCOPY WITH RETROGRADE PYELOGRAM, BILATERAL URETEROSCOPY AND STENT PLACEMENT LEFT URETER,BILATERAL STONE EXTRACTION , HOLMIUM LASER LEFT URETER;  Surgeon: Toribio Neysa Repine, MD;  Location: [facility];  Service: Urology;  Laterality: Bilateral;   CYSTOSCOPY WITH STENT PLACEMENT Left 03/26/2013   Procedure: CYSTOSCOPY WITH STENT PLACEMENT;  Surgeon: Toribio Neysa Repine, MD;  Location: [facility];  Service: Urology;  Laterality: Left;   CYSTOSCOPY/URETEROSCOPY/HOLMIUM LASER/STENT PLACEMENT Left 07/07/2020   Procedure: CYSTOSCOPY LEFT URETEROSCOPY/HOLMIUM LASER/STENT PLACEMENT;  Surgeon: Carolee Sherwood JONETTA DOUGLAS, MD;  Location: [facility];  Service: Urology;  Laterality: Left;   DIAGNOSTIC LAPAROSCOPY  04/12/2009   ESOPHAGOGASTRODUODENOSCOPY (EGD) WITH PROPOFOL  N/A 02/10/2016   Procedure: ESOPHAGOGASTRODUODENOSCOPY (EGD) WITH PROPOFOL ;  Surgeon: Lamar Bunk, MD;  Location: [facility];  Service: Endoscopy;  Laterality: N/A;   KIDNEY SURGERY  1966   BILATERAL URETER'S DILATATION   LUMBAR LAMINECTOMY/DECOMPRESSION MICRODISCECTOMY  05/18/2011   Procedure: LUMBAR LAMINECTOMY/DECOMPRESSION MICRODISCECTOMY;  Surgeon: Reyes JAYSON Billing;  Location: [facility];  Service: Orthopedics;  Laterality: Right;  Decompression Lumbar 4-Lumbar 5  Right    (xray)    LUMBAR LAMINECTOMY/DECOMPRESSION MICRODISCECTOMY N/A 05/06/2021   Procedure: Microlumbar decompression L5-S1;  Surgeon: Billing Reyes, MD;  Location: [facility];  Service: Orthopedics;  Laterality: N/A;  3 C-bed 90 mins   MELANOMA EXCISION WITH SENTINEL LYMPH NODE BIOPSY  2001    moh's procedure/  RIGHT FOREHEAD AND UPPER EYEBROW   RIGHT LATERAL PAROTIDECTOMY W/ NERVE DISSECTION / RIGHT MODIFIED RADICAL NECK DISSECTION SPARING SCM ELEVENTH NERVE AND INTERNAL JUGULAR VEIN  09-12-2006  DR DWIGHT BATES   DR DWIGHT BATES; inside gland; lots of lymph nodes    ALLERGIES: Allergies  Allergen Reactions   Cephalexin Shortness Of Breath and Rash   Lorazepam  Shortness Of Breath and Other (See Comments)    She may be very sensitive to benzo.    Stops breathing    Promethazine      Felt strange, knocked me out   Seasonal Ic [Octacosanol] Other (See Comments)    FAMILY HISTORY: Family History  Problem Relation Age of Onset   Hypertension Mother    COPD Mother    Breast cancer Mother        breast   Dementia Mother    Heart disease Father    Cancer Father        Colorectal   Hyperlipidemia Father    Hypertension Father    Colon cancer Sister     SOCIAL HISTORY: Social History   Tobacco Use   Smoking status: Never   Smokeless tobacco: Never  Vaping Use   Vaping status: Never Used  Substance Use Topics   Alcohol use: Not Currently    Comment: occasional- 1 drink per month   Drug use: Never   Social History   Social History Narrative   Right handed   Caffeine  3 cups daily   Lives one story home with basement 1 time weekly   Lives with husband and a dog      Objective:  Vital Signs:  BP 130/73   Pulse 100   Ht 5' 4 (1.626 m)   Wt 196 lb (88.9 kg)   LMP 05/18/2011   SpO2 99%   BMI  33.64 kg/m   General: No acute distress.  Patient appears well-groomed.   Head:  Normocephalic/atraumatic Eyes:  Fundi examined, no obvious papilledema Neck: supple, no paraspinal tenderness Lungs:  Non-labored breathing on room air  Neurological Exam: alert and oriented.  Speech fluent and not dysarthric, language intact.  CN II-XII intact. Bulk and tone normal, muscle strength 5/5 throughout.  Sensation to light touch intact.  Deep tendon reflexes 2+  throughout, toes downgoing.  Finger to nose testing intact.  Gait: antalgic gait.   Labs and Imaging review: New results: Vit D (01/19/23): low at 29  TSH (04/13/23): wnl  07/16/23: Lipid panel: tChol 206, LDL 110, TG 113 CBC w/ diff: MCV 97 CMP unremarkable  CT soft tissue neck (03/27/23): IMPRESSION: 1. No evidence of recurrent or metastatic disease. 2. Surgical absence of the right parotid gland. Question right submandibular resection versus post radiation atrophy. 3. No abnormality seen to explain the clinical presentation. 4. Atherosclerotic change at both carotid bifurcations. No acute vascular finding. 5. Some scarring in the region of the right neck particularly affecting the sternocleidomastoid muscle. No finding that would suggest recurrent malignancy.  Previously reviewed results: 04/11/22: B12: 187 Folate: wnl   Recent Labs           Lab Results  Component Value Date    HGBA1C 5.5 01/03/2017      Recent Labs           Lab Results  Component Value Date    TSH 1.250 02/03/2022      Normal or unremarkable: CMP CBC significant for elevated MCV (99)   Imaging: CT maxillofacial wo contrast (01/30/22): FINDINGS: Paranasal sinuses:   Frontal: Normally aerated. Patent frontal sinus drainage pathways.   Ethmoid: Minor mucosal thickening.   Maxillary: Minor mucosal thickening.   Sphenoid: Normally aerated. Patent sphenoethmoidal recesses.   Right ostiomeatal unit: Patent.   Left ostiomeatal unit: Patent.   Nasal passages: Patent. Intact nasal septum is mildly deviated to the right.   Anatomy: Cribriform plate and fovea ethmoidalis are intact. The fovea ethmoidalis is slightly higher on the left. Sellar sphenoid pneumatization pattern. No dehiscence of carotid or optic canals. No onodi cell.   Other: Resection of right parotid and submandibular glands with additional soft tissue thickening presumably reflecting sequelae of neck dissection. Similar  appearance to prior neck CT.   IMPRESSION: No significant paranasal sinus inflammatory changes at the time of imaging. Drainage pathways are patent. Mild rightward deviation of the nasal septum.   CT soft tissue neck w contrast (08/08/21): FINDINGS: Pharynx and larynx: Streak and beam hardening artifact arising from dental restoration partially obscures the oral cavity. No appreciable swelling or discrete mass within the oral cavity, pharynx or larynx.   Salivary glands: Redemonstrated sequela of prior right parotid and submandibular gland resection, and presumed right neck is dissection (with unchanged scarring at these sites).Unremarkable appearance of the left parotid and submandibular glands.   Thyroid : Atrophy of the right lobe. Otherwise unremarkable.   Lymph nodes: No pathologically enlarged cervical chain lymph nodes identified.   Vascular: The major vascular structures of the neck are patent. Atherosclerotic plaque within the visualized aortic arch, proximal major branch vessels of the neck and carotid arteries.   Limited intracranial: No appreciable acute intracranial abnormality within the field of view.   Visualized orbits: Incompletely imaged. No orbital mass or acute orbital finding at the imaged levels.   Mastoids and visualized paranasal sinuses: Small mucous retention cyst within the left maxillary sinus. Chronic  right mastoid effusion.   Skeleton: Straightening of the expected cervical lordosis. Cervical spondylosis. No acute bony abnormality or aggressive osseous lesion.   Upper chest: No consolidation within the imaged lung apices.   IMPRESSION: Redemonstrated sequela of prior right parotid and submandibular gland resection, and presumed right neck dissection. No evidence of residual/recurrent tumor.   No pathologically enlarged cervical chain lymph nodes.   Small left maxillary sinus mucous retention cyst.   Chronic right mastoid effusion.    Cervical spondylosis.   Aortic Atherosclerosis (ICD10-I70.0).   CT renal stone study (04/03/22): FINDINGS: Lower chest: In image 20 of series 5, there is a 1 cm nodular density in lingula which was not evident in the previous study. There is no focal consolidation in the lower lung fields. There is no pleural effusion.   Hepatobiliary: No focal abnormalities are seen in liver. There is no dilation of bile ducts.   Pancreas: No focal abnormalities are seen.   Spleen: Unremarkable.   Adrenals/Urinary Tract: Adrenals are unremarkable. There is no hydronephrosis. There are no renal stones. Previously seen left renal stones are not evident in the current study. There are no calcific densities in the courses of both ureters. Urinary bladder is not distended.   Stomach/Bowel: Stomach is unremarkable. Possible oral medication is seen in the lumen of stomach. Small bowel loops are not dilated. Appendix is difficult to visualize. There are no secondary signs of appendicitis in the right lower quadrant. There is no wall thickening in colon. There is no pericolic stranding. There is incomplete distention of sigmoid colon.   Vascular/Lymphatic: Scattered arterial calcifications are seen.   Reproductive: Unremarkable.   Other: There is no ascites or pneumoperitoneum. Small umbilical hernia containing fat is seen.   Musculoskeletal: There is 30% decrease in height of body of L3 vertebra. Schmorl's node is seen in the upper endplate of body of L3 vertebra. There is 50% decrease in height of body of L4 vertebra. These findings were not seen in the previous examination.   IMPRESSION: There is no evidence of intestinal obstruction or pneumoperitoneum. There is no hydronephrosis.   There is a new 1 cm noncalcified nodular density in the lingula and left lower lung field. Consider one of the following in 3 months for both low-risk and high-risk individuals: (a) repeat chest CT,  (b) follow-up PET-CT, or (c) tissue sampling. This recommendation follows the consensus statement: Guidelines for Management of Incidental Pulmonary Nodules Detected on CT Images: From the Fleischner Society 2017; Radiology 2017; 284:228-243.   There is interval decrease in height of bodies of L3 and L4 vertebrae since 04/21/2016 suggesting fractures of indeterminate age. Please correlate with clinical symptoms and physical examination findings.  Assessment/Plan:  This is [name], a 63 y.o. female with: Episodic migraine without aura - well controlled on Nortriptyline  30 mg at bedtime. Now only having about 1 mild headache per month that will respond well to Tylenol  B12 deficiency Vit D deficiency   Plan: -Continue B12 and vit D supplementation -For migraines: Migraine prevention:  Continue Nortripyline 30 mg at bedtime  Migraine rescue:  Tylenol  as needed at headache onset Limit use of pain relievers to no more than 2 days out of week to prevent risk of rebound or medication-overuse headache. Keep headache diary  Return to clinic in 1 year    Venetia Potters, MD

## 2023-07-19 NOTE — Telephone Encounter (Signed)
Urine culture was done please see about urologist.

## 2023-07-20 NOTE — Telephone Encounter (Signed)
Urine culture negative, urologists referral placed.

## 2023-07-23 ENCOUNTER — Other Ambulatory Visit: Payer: Self-pay | Admitting: Orthopedic Surgery

## 2023-07-23 DIAGNOSIS — M5459 Other low back pain: Secondary | ICD-10-CM

## 2023-07-23 DIAGNOSIS — M25562 Pain in left knee: Secondary | ICD-10-CM

## 2023-07-23 DIAGNOSIS — R5381 Other malaise: Secondary | ICD-10-CM

## 2023-07-24 ENCOUNTER — Other Ambulatory Visit: Payer: Self-pay | Admitting: Orthopedic Surgery

## 2023-07-24 DIAGNOSIS — M5459 Other low back pain: Secondary | ICD-10-CM

## 2023-07-26 ENCOUNTER — Ambulatory Visit: Payer: BC Managed Care – PPO | Admitting: Neurology

## 2023-07-26 ENCOUNTER — Encounter: Payer: Self-pay | Admitting: Neurology

## 2023-07-26 VITALS — BP 130/73 | HR 100 | Ht 64.0 in | Wt 196.0 lb

## 2023-07-26 DIAGNOSIS — G43009 Migraine without aura, not intractable, without status migrainosus: Secondary | ICD-10-CM

## 2023-07-26 DIAGNOSIS — E559 Vitamin D deficiency, unspecified: Secondary | ICD-10-CM

## 2023-07-26 DIAGNOSIS — R5383 Other fatigue: Secondary | ICD-10-CM

## 2023-07-26 DIAGNOSIS — E538 Deficiency of other specified B group vitamins: Secondary | ICD-10-CM

## 2023-07-26 NOTE — Patient Instructions (Addendum)
-  Continue B12 and vitamin D  supplementation  -For migraines: Migraine prevention:  Continue Nortripyline 30 mg at bedtime  Migraine rescue:  Tylenol  as needed at headache onset Limit use of pain relievers to no more than 2 days out of week to prevent risk of rebound or medication-overuse headache. Keep headache diary  I will see you again in 1 year or sooner if needed.  The physicians and staff at [facility] are committed to providing excellent care. You may receive a survey requesting feedback about your experience at our office. We strive to receive very good responses to the survey questions. If you feel that your experience would prevent you from giving the office a very good  response, please contact our office to try to remedy the situation. We may be reached at [phone]. Thank you for taking the time out of your busy day to complete the survey.  Venetia Potters, MD [facility]

## 2023-07-30 ENCOUNTER — Ambulatory Visit
Admission: RE | Admit: 2023-07-30 | Discharge: 2023-07-30 | Disposition: A | Discharge status: Home / Self Care | Payer: BC Managed Care – PPO | Source: Ambulatory Visit | Attending: Orthopedic Surgery | Admitting: Orthopedic Surgery

## 2023-07-30 DIAGNOSIS — M25562 Pain in left knee: Secondary | ICD-10-CM

## 2023-07-30 DIAGNOSIS — M5459 Other low back pain: Secondary | ICD-10-CM

## 2023-08-01 ENCOUNTER — Emergency Department (HOSPITAL_COMMUNITY): Payer: BC Managed Care – PPO

## 2023-08-01 ENCOUNTER — Encounter (HOSPITAL_COMMUNITY): Payer: Self-pay

## 2023-08-01 ENCOUNTER — Other Ambulatory Visit: Payer: Self-pay

## 2023-08-01 ENCOUNTER — Encounter: Payer: Self-pay | Admitting: Family Medicine

## 2023-08-01 ENCOUNTER — Emergency Department (HOSPITAL_COMMUNITY)
Admission: EM | Admit: 2023-08-01 | Discharge: 2023-08-01 | Disposition: A | Discharge status: Home / Self Care | Payer: BC Managed Care – PPO | Attending: Emergency Medicine | Admitting: Emergency Medicine

## 2023-08-01 ENCOUNTER — Ambulatory Visit (INDEPENDENT_AMBULATORY_CARE_PROVIDER_SITE_OTHER): Payer: BC Managed Care – PPO | Admitting: Family Medicine

## 2023-08-01 VITALS — BP 131/83 | HR 100

## 2023-08-01 DIAGNOSIS — R079 Chest pain, unspecified: Secondary | ICD-10-CM | POA: Diagnosis not present

## 2023-08-01 DIAGNOSIS — I10 Essential (primary) hypertension: Secondary | ICD-10-CM | POA: Diagnosis not present

## 2023-08-01 DIAGNOSIS — R0781 Pleurodynia: Secondary | ICD-10-CM | POA: Diagnosis present

## 2023-08-01 LAB — HEPATIC FUNCTION PANEL
ALT: 32 U/L (ref 0–44)
AST: 27 U/L (ref 15–41)
Albumin: 4.2 g/dL (ref 3.5–5.0)
Alkaline Phosphatase: 51 U/L (ref 38–126)
Bilirubin, Direct: <0.1 mg/dL (ref 0.0–0.2)
Total Bilirubin: 0.5 mg/dL (ref 0.0–1.2)
Total Protein: 7.4 g/dL (ref 6.5–8.1)

## 2023-08-01 LAB — BASIC METABOLIC PANEL
Anion gap: 8 (ref 5–15)
BUN: 11 mg/dL (ref 8–23)
CO2: 30 mmol/L (ref 22–32)
Calcium: 9.4 mg/dL (ref 8.9–10.3)
Chloride: 103 mmol/L (ref 98–111)
Creatinine, Ser: 0.81 mg/dL (ref 0.44–1.00)
GFR, Estimated: >60 mL/min (ref >60)
Glucose, Bld: 116 mg/dL — ABNORMAL HIGH (ref 70–99)
Potassium: 4.4 mmol/L (ref 3.5–5.1)
Sodium: 141 mmol/L (ref 135–145)

## 2023-08-01 LAB — CBC WITH DIFFERENTIAL/PLATELET
Abs Immature Granulocytes: 0.01 10*3/uL (ref 0.00–0.07)
Basophils Absolute: 0 10*3/uL (ref 0.0–0.1)
Basophils Relative: 0 %
Eosinophils Absolute: 0.1 10*3/uL (ref 0.0–0.5)
Eosinophils Relative: 2 %
HCT: 38.5 % (ref 36.0–46.0)
Hemoglobin: 13.2 g/dL (ref 12.0–15.0)
Immature Granulocytes: 0 %
Lymphocytes Relative: 26 %
Lymphs Abs: 1.2 10*3/uL (ref 0.7–4.0)
MCH: 32.9 pg (ref 26.0–34.0)
MCHC: 34.3 g/dL (ref 30.0–36.0)
MCV: 96 fL (ref 80.0–100.0)
Monocytes Absolute: 0.4 10*3/uL (ref 0.1–1.0)
Monocytes Relative: 9 %
Neutro Abs: 2.9 10*3/uL (ref 1.7–7.7)
Neutrophils Relative %: 63 %
Platelets: 180 10*3/uL (ref 150–400)
RBC: 4.01 MIL/uL (ref 3.87–5.11)
RDW: 12.1 % (ref 11.5–15.5)
WBC: 4.7 10*3/uL (ref 4.0–10.5)
nRBC: 0 % (ref 0.0–0.2)

## 2023-08-01 LAB — TROPONIN I (HIGH SENSITIVITY)
Troponin I (High Sensitivity): <2 ng/L (ref <18)
Troponin I (High Sensitivity): <2 ng/L (ref <18)

## 2023-08-01 LAB — MAGNESIUM: Magnesium: 2.3 mg/dL (ref 1.7–2.4)

## 2023-08-01 LAB — LIPASE, BLOOD: Lipase: 28 U/L (ref 11–51)

## 2023-08-01 MED ORDER — FAMOTIDINE IN NACL 20-0.9 MG/50ML-% IV SOLN
20.0000 mg | Freq: Once | INTRAVENOUS | Status: AC
  Administered 2023-08-01: 20 mg via INTRAVENOUS
  Filled 2023-08-01: qty 50

## 2023-08-01 MED ORDER — ASPIRIN 81 MG PO CHEW
325.0000 mg | CHEWABLE_TABLET | Freq: Once | ORAL | Status: AC
Start: 2023-08-01 — End: 2023-08-01
  Administered 2023-08-01: 325 mg via ORAL

## 2023-08-01 MED ORDER — PANTOPRAZOLE SODIUM 40 MG PO TBEC
40.0000 mg | DELAYED_RELEASE_TABLET | Freq: Every day | ORAL | 0 refills | Status: DC
Start: 2023-08-01 — End: 2024-01-09

## 2023-08-01 MED ORDER — ALUM & MAG HYDROXIDE-SIMETH 200-200-20 MG/5ML PO SUSP
30.0000 mL | Freq: Once | ORAL | Status: AC
  Administered 2023-08-01: 30 mL via ORAL
  Filled 2023-08-01: qty 30

## 2023-08-01 MED ORDER — IOHEXOL 350 MG/ML SOLN
100.0000 mL | Freq: Once | INTRAVENOUS | Status: AC | PRN
  Administered 2023-08-01: 100 mL via INTRAVENOUS

## 2023-08-01 MED ORDER — LIDOCAINE VISCOUS HCL 2 % MT SOLN
15.0000 mL | Freq: Once | OROMUCOSAL | Status: AC
  Administered 2023-08-01: 15 mL via ORAL
  Filled 2023-08-01: qty 15

## 2023-08-01 NOTE — Progress Notes (Signed)
 Subjective:  Patient ID: [name], female    DOB: 1961-01-31, 63 y.o.   MRN: [id number]  Patient Care Team: Dettinger, Elige Radon, MD as PCP - General (Family Medicine) Wendall Stade, MD as PCP - Cardiology (Cardiology) Janalyn Harder, MD (Inactive) as Consulting Physician (Dermatology)   Chief Complaint:  Chest Pain  HPI: [name] is a 63 y.o. female presenting on 08/01/2023 for Chest Pain  Chest Pain   1. Chest pain, unspecified type Patient states that she started with chest pain at noon, 1 hour ago. States that she took 3 nitroglycerin at home each 5 minutes apart and her CP did not resolve. States that it has improved slightly, but remains 4/10. States that pain is sharp and radiating to her back. She denies nausea. Endorses slight shortness of breath. Pain is not reproducible with external pressure. Pain does not change with breathing. She has taken all medications this morning including percocet for back pain. She is not currently on anticoagulation. She has a PMH significant for Takatsubo DCM - no CAD by cath in 2016. Reports that she has never had pain like this before.   Relevant past medical, surgical, family, and social history reviewed and updated as indicated.  Allergies and medications reviewed and updated. Data reviewed: Chart in Epic.   Past Medical History:  Diagnosis Date   Anxiety    Arthritis    back (12/30/2014)   Asthma    as a child, no problems as an adult, has an albuterol inhaler   Cerebral cyst    per brain MRI 07-12-2019 unchanged 6mm cyst inferomedial right frontal lobe   Chronic headaches    Chronic systolic (congestive) heart failure (HCC)    followed by dr Estrella Myrtle   Complication of anesthesia    pt is very sensitive to benzodiazepines, pt stated "almost died"   Compression fracture of lumbar vertebra (HCC) 05/27/2020   per pt L3 and L4   DCM (dilated cardiomyopathy) (HCC)    w/ Takatsubo---  followed by dr Eden Emms---  stress-induced  --- cardiac cath 12-30-2014 ef 30-35%, recovered per cardiac MRI 03-15-2015 ef  64%   Depression    Disorder of mastoid    per brain MRI 07-12-2019 persistant large mastoid effusion   Dysuria    Environmental and seasonal allergies    Frequency of urination    GERD (gastroesophageal reflux disease)    History of asthma    childhood   History of esophageal spasm    History of kidney stones    History of melanoma excision 2001   right supraorbital (right forehead and upper eyelid )  s/p  Moh's procedure w/ sln bx,  no recurrence per pt   History of non-ST elevation myocardial infarction (NSTEMI) 12/29/2014   per cath normal coronaries , ef 30-35%---  stress-- induced Takotsubo syndrome    History of palpitations 01/05/2015   STRESS INDUCED   History of parotid cancer 2008   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-2006 s/p  right lateral parotidectomy w/ nerve dissection , modified radical neck dissection ;  completed  x35 fractions raditation 2008;  no recurrence per pt   Hyperlipidemia    Hypertension    Kidney cysts    Left ureteral calculus    Low back pain    Melanoma (HCC) 2001   face   Muscle spasm of back    Myocardial infarction (HCC) 2016   Pneumonia    as a child  child   PONV (postoperative nausea and vomiting)    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   Squamous cell carcinoma of skin 02/22/2021   in istu- right forehead ([id number])   Takotsubo syndrome 12/29/2014   cardiologist---  dr Estrella Myrtle--- dx NSTEMI -- stress-induced w/ DCM--  per cath 12-30-2014 normal coronaries , ef 30-35%,  recovered ef per cardiac MRI  ef 64%   Urgency of urination    Wears glasses     Past Surgical History:  Procedure Laterality Date   BACK SURGERY     BREAST BIOPSY Bilateral early 2000's   CARDIAC CATHETERIZATION  2006 (APPROX)  [place]   NORMAL   CARDIAC CATHETERIZATION  12/30/2014   CARDIAC CATHETERIZATION N/A 12/30/2014   Procedure:  Left Heart Cath and Coronary Angiography;  Surgeon: Iran Ouch, MD;  Location: [facility];  Service: Cardiovascular;  Laterality: N/A;   CARDIOVASCULAR STRESS TEST  03-21-2012  DR Eden Emms   NORMAL NUCLEAR STUDY/  NO ISCHEMIA/  EF 63%   COLONOSCOPY  10/2020   COLONOSCOPY WITH PROPOFOL  05/29/2017   CYSTOSCOPY W/ URETERAL STENT PLACEMENT Left 03/26/2013   Procedure: CYSTOSCOPY WITH RETROGRADE PYELOGRAM ;  Surgeon: Milford Cage, MD;  Location: [facility];  Service: Urology;  Laterality: Left;   CYSTOSCOPY WITH RETROGRADE PYELOGRAM, URETEROSCOPY AND STENT PLACEMENT Bilateral 03/19/2013   Procedure: CYSTOSCOPY WITH RETROGRADE PYELOGRAM, BILATERAL URETEROSCOPY AND STENT PLACEMENT LEFT URETER,BILATERAL STONE EXTRACTION , HOLMIUM LASER LEFT URETER;  Surgeon: Milford Cage, MD;  Location: [facility];  Service: Urology;  Laterality: Bilateral;   CYSTOSCOPY WITH STENT PLACEMENT Left 03/26/2013   Procedure: CYSTOSCOPY WITH STENT PLACEMENT;  Surgeon: Milford Cage, MD;  Location: [facility];  Service: Urology;  Laterality: Left;   CYSTOSCOPY/URETEROSCOPY/HOLMIUM LASER/STENT PLACEMENT Left 07/07/2020   Procedure: CYSTOSCOPY LEFT URETEROSCOPY/HOLMIUM LASER/STENT PLACEMENT;  Surgeon: Crista Elliot, MD;  Location: [facility];  Service: Urology;  Laterality: Left;   DIAGNOSTIC LAPAROSCOPY  04/12/2009   ESOPHAGOGASTRODUODENOSCOPY (EGD) WITH PROPOFOL N/A 02/10/2016   Procedure: ESOPHAGOGASTRODUODENOSCOPY (EGD) WITH PROPOFOL;  Surgeon: Bernette Redbird, MD;  Location: [facility];  Service: Endoscopy;  Laterality: N/A;   KIDNEY SURGERY  1966   BILATERAL URETER'S DILATATION   LUMBAR LAMINECTOMY/DECOMPRESSION MICRODISCECTOMY  05/18/2011   Procedure: LUMBAR LAMINECTOMY/DECOMPRESSION MICRODISCECTOMY;  Surgeon: Javier Docker;  Location: [facility];  Service: Orthopedics;  Laterality: Right;  Decompression Lumbar 4-Lumbar 5  Right    (xray)     LUMBAR LAMINECTOMY/DECOMPRESSION MICRODISCECTOMY N/A 05/06/2021   Procedure: Microlumbar decompression L5-S1;  Surgeon: Jene Every, MD;  Location: [facility];  Service: Orthopedics;  Laterality: N/A;  3 C-bed 90 mins   MELANOMA EXCISION WITH SENTINEL LYMPH NODE BIOPSY  2001   moh's procedure/  RIGHT FOREHEAD AND UPPER EYEBROW   RIGHT LATERAL PAROTIDECTOMY W/ NERVE DISSECTION / RIGHT MODIFIED RADICAL NECK DISSECTION SPARING SCM ELEVENTH NERVE AND INTERNAL JUGULAR VEIN  09-12-2006  DR DWIGHT BATES   DR DWIGHT BATES; "inside gland; lots of lymph nodes"    Social History   Socioeconomic History   Marital status: Married    Spouse name: Not on file   Number of children: Not on file   Years of education: Not on file   Highest education level: Not on file  Occupational History   Occupation: Conservation officer, nature at [employer]  Tobacco Use   Smoking status: Never   Smokeless tobacco: Never  Vaping Use   Vaping status: Never Used  Substance and Sexual Activity   Alcohol use: Not Currently    Comment: occasional- 1 drink per month   Drug use: Never   Sexual activity: Not on file  Other Topics Concern   Not on file  Social History Narrative   Right handed   Caffeine 3 cups daily   Lives one story home with basement 1 time weekly   Lives with husband and a dog   Social Drivers of Corporate investment banker Strain: Not on file  Food Insecurity: Not on file  Transportation Needs: Not on file  Physical Activity: Not on file  Stress: Not on file  Social Connections: Not on file  Intimate Partner Violence: Not on file    Outpatient Encounter Medications as of 08/01/2023  Medication Sig   atorvastatin (LIPITOR) 40 MG tablet Take 1 tablet (40 mg total) by mouth daily.   calcium carbonate (TUMS - DOSED IN MG ELEMENTAL CALCIUM) 500 MG chewable tablet Chew 1,000 mg by mouth daily as needed for indigestion or heartburn.   carvedilol (COREG) 3.125 MG tablet Take 1 tablet (3.125 mg total) by mouth 2  (two) times daily with a meal.   cetirizine (ZYRTEC ALLERGY) 10 MG tablet Take 1 tablet (10 mg total) by mouth daily. (Patient taking differently: Take 10 mg by mouth as needed.)   Cholecalciferol (D-3-5 PO) Take 1 Dose by mouth daily.   docusate sodium (COLACE) 100 MG capsule Take 1 capsule (100 mg total) by mouth 2 (two) times daily as needed for mild constipation.   fluticasone (FLONASE) 50 MCG/ACT nasal spray Place 2 sprays into both nostrils daily. (Patient taking differently: Place 2 sprays into both nostrils as needed.)   gabapentin (NEURONTIN) 300 MG capsule Take 300 mg by mouth at bedtime.   ibuprofen (ADVIL) 200 MG tablet Take 200 mg by mouth every 6 (six) hours as needed.   lidocaine (LIDODERM) 5 % Place 1 patch onto the skin daily as needed (pain).   lisinopril (ZESTRIL) 2.5 MG tablet Take 1 tablet (2.5 mg total) by mouth every morning. Please call [phone] to schedule an appointment with Dr. Charlton Haws for future refills. Thank you. 1st attempt.   loratadine-pseudoephedrine (CLARITIN-D 24-HOUR) 10-240 MG 24 hr tablet Take 1 tablet by mouth as needed for allergies.   milk thistle 175 MG tablet Take 175 mg by mouth daily.   nitroGLYCERIN (NITROSTAT) 0.4 MG SL tablet Place 1 tablet (0.4 mg total) under the tongue every 5 (five) minutes as needed for chest pain. DISSOLVE ONE TABLET UNDER THE TONGUE EVERY 5 MINUTES AS NEEDED FOR CHEST PAIN.  DO NOT EXCEED A TOTAL OF 3 DOSES IN 15 MINUTES   NON FORMULARY headache relief with acetamimophen, aspirin   nortriptyline (PAMELOR) 10 MG capsule Take 3 capsules (30 mg total) by mouth at bedtime.   oxyCODONE (ROXICODONE) 5 MG immediate release tablet Take 1 tablet (5 mg total) by mouth every 6 (six) hours as needed for severe pain.   polyethylene glycol (MIRALAX / GLYCOLAX) 17 g packet Take 17 g by mouth daily.   predniSONE (DELTASONE) 5 MG tablet Take 1 dose pk by oral route as directed for 6 days. (Patient not taking: Reported on 07/26/2023)    silver sulfADIAZINE (SILVADENE) 1 % cream Apply 1 application topically daily. Apply to shoulder nightly 7-14 days (Patient taking differently: Apply 1 application  topically as needed. Apply to shoulder nightly 7-14 days)   No facility-administered encounter medications on file as of 08/01/2023.   Allergies  Cephalexin Shortness Of Breath and Rash   Lorazepam Shortness Of Breath and Other (See Comments)    She may be very sensitive to benzo.    Stops breathing    Promethazine     Felt strange, "knocked me out"   Seasonal Ic [Octacosanol] Other (See Comments)    Review of Systems  Cardiovascular:  Positive for chest pain.   Objective:  BP 131/83   Pulse 100   LMP 05/18/2011   O2 WNL  Wt Readings from Last 3 Encounters:  07/26/23 196 lb (88.9 kg)  07/16/23 192 lb (87.1 kg)  05/25/23 195 lb (88.5 kg)    Physical Exam Constitutional:      Appearance: She is well-developed. She is not ill-appearing or diaphoretic.  Cardiovascular:     Rate and Rhythm: Regular rhythm. Tachycardia present.     Heart sounds: Normal heart sounds. Heart sounds not distant.     No systolic murmur is present.     No diastolic murmur is present.  Pulmonary:     Effort: Pulmonary effort is normal.     Breath sounds: No decreased breath sounds, wheezing, rhonchi or rales.  Skin:    General: Skin is warm.     Capillary Refill: Capillary refill takes less than 2 seconds.  Neurological:     General: No focal deficit present.     Mental Status: She is alert and oriented to person, place, and time.  Psychiatric:        Mood and Affect: Mood normal. Mood is not anxious.        Behavior: Behavior normal. Behavior is not agitated.    Results for orders placed or performed in visit on 07/16/23  Cytology - PAP([place])   Collection Time: 07/16/23 10:00 AM  Result Value Ref Range   Adequacy      Satisfactory for evaluation. The presence or absence of an   Adequacy       endocervical/transformation zone component cannot be determined because   Adequacy of atrophy.    Diagnosis      - Negative for intraepithelial lesion or malignancy (NILM)  Microscopic Examination   Collection Time: 07/16/23 10:10 AM   Urine  Result Value Ref Range   WBC, UA 0-5 0 - 5 /hpf   RBC, Urine 0-2 0 - 2 /hpf   Epithelial Cells (non renal) 0-10 0 - 10 /hpf   Renal Epithel, UA None seen None seen /hpf   Bacteria, UA Few (A) None seen/Few   Yeast, UA None seen None seen  Urinalysis, Complete   Collection Time: 07/16/23 10:10 AM  Result Value Ref Range   Specific Gravity, UA 1.020 1.005 - 1.030   pH, UA 5.5 5.0 - 7.5   Color, UA Yellow Yellow   Appearance Ur Clear Clear   Leukocytes,UA Trace (A) Negative   Protein,UA Negative Negative/Trace   Glucose, UA Negative Negative   Ketones, UA Negative Negative   RBC, UA 1+ (A) Negative   Bilirubin, UA Negative Negative   Urobilinogen, Ur 0.2 0.2 - 1.0 mg/dL   Nitrite, UA Negative Negative   Microscopic Examination See below:   CMP14+EGFR   Collection Time: 07/16/23 10:14 AM  Result Value Ref Range   Glucose 89 70 - 99 mg/dL   BUN 14 8 - 27 mg/dL   Creatinine, Ser 1.61 0.57 - 1.00 mg/dL   eGFR 76 >09 UE/AVW/0.98   BUN/Creatinine Ratio 16 12 - 28   Sodium 142 134 - 144 mmol/L  Potassium 4.0 3.5 - 5.2 mmol/L   Chloride 102 96 - 106 mmol/L   CO2 24 20 - 29 mmol/L   Calcium 9.4 8.7 - 10.3 mg/dL   Total Protein 6.3 6.0 - 8.5 g/dL   Albumin 4.5 3.9 - 4.9 g/dL   Globulin, Total 1.8 1.5 - 4.5 g/dL   Bilirubin Total 0.4 0.0 - 1.2 mg/dL   Alkaline Phosphatase 98 44 - 121 IU/L   AST 18 0 - 40 IU/L   ALT 26 0 - 32 IU/L  CBC with Differential/Platelet   Collection Time: 07/16/23 10:14 AM  Result Value Ref Range   WBC 8.6 3.4 - 10.8 x10E3/uL   RBC 4.27 3.77 - 5.28 x10E6/uL   Hemoglobin 13.8 11.1 - 15.9 g/dL   Hematocrit 16.1 09.6 - 46.6 %   MCV 97 79 - 97 fL   MCH 32.3 26.6 - 33.0 pg   MCHC 33.3 31.5 - 35.7 g/dL   RDW 04.5  40.9 - 81.1 %   Platelets 208 150 - 450 x10E3/uL   Neutrophils 72 Not Estab. %   Lymphs 19 Not Estab. %   Monocytes 7 Not Estab. %   Eos 1 Not Estab. %   Basos 0 Not Estab. %   Neutrophils Absolute 6.2 1.4 - 7.0 x10E3/uL   Lymphocytes Absolute 1.7 0.7 - 3.1 x10E3/uL   Monocytes Absolute 0.6 0.1 - 0.9 x10E3/uL   EOS (ABSOLUTE) 0.1 0.0 - 0.4 x10E3/uL   Basophils Absolute 0.0 0.0 - 0.2 x10E3/uL   Immature Granulocytes 1 Not Estab. %   Immature Grans (Abs) 0.1 0.0 - 0.1 x10E3/uL  Lipid panel   Collection Time: 07/16/23 10:14 AM  Result Value Ref Range   Cholesterol, Total 206 (H) 100 - 199 mg/dL   Triglycerides 914 0 - 149 mg/dL   HDL 76 >78 mg/dL   VLDL Cholesterol Cal 20 5 - 40 mg/dL   LDL Chol Calc (NIH) 295 (H) 0 - 99 mg/dL   Chol/HDL Ratio 2.7 0.0 - 4.4 ratio  Urine Culture   Collection Time: 07/16/23 11:42 AM   Specimen: Urine   UR  Result Value Ref Range   Urine Culture, Routine Final report    Organism ID, Bacteria Comment        05/25/2023    8:09 AM 02/20/2023   10:48 AM 02/08/2023    2:40 PM 03/31/2022    2:38 PM 02/03/2022   11:20 AM  Depression screen PHQ 2/9  Decreased Interest 0  0 0 0  Down, Depressed, Hopeless 0 0 0 0 0  PHQ - 2 Score 0 0 0 0 0  Altered sleeping 0 0  0 0  Tired, decreased energy 1 1  0 0  Change in appetite 0 0  0 0  Feeling bad or failure about yourself  0 0  0 0  Trouble concentrating 0 0  0 0  Moving slowly or fidgety/restless 0 0  0 0  Suicidal thoughts 0 0  0 0  PHQ-9 Score 1 1  0 0  Difficult doing work/chores Not difficult at all Not difficult at all  Not difficult at all Not difficult at all       05/25/2023    8:09 AM 02/20/2023   10:48 AM 03/31/2022    2:38 PM 12/29/2021    1:46 PM  GAD 7 : Generalized Anxiety Score  Nervous, Anxious, on Edge 0 0 0 0  Control/stop worrying 0 0 0 0  Worry too much -  different things 0 0 0 0  Trouble relaxing 0 0 0 0  Restless 0 0 0 0  Easily annoyed or irritable 0 0 0 0  Afraid - awful  might happen 0 0 0 0  Total GAD 7 Score 0 0 0 0  Anxiety Difficulty Not difficult at all Not difficult at all Not difficult at all Not difficult at all      Pertinent labs & imaging results that were available during my care of the patient were reviewed by me and considered in my medical decision making.  Assessment & Plan:  [name] was seen today for chest pain.  Diagnoses and all orders for this visit:  Chest pain, unspecified type Negative for STEMI on EKG. Inverted [place]. EKG similar to prior from 03/27/23. Provided medication as below. Given patient symptomology, tachycardia, and cardiac history, recommend that she present to ED for further evaluation. Patient does not have another driver available. She agrees to EMS transport. Reviewed cath report from 2016. Reviewed notes from [place], MD 02/08/22.  -     EKG 12-Lead -     aspirin chewable tablet 325 mg  Continue all other maintenance medications.  Follow up plan: Return if symptoms worsen or fail to improve.   Continue healthy lifestyle choices, including diet (rich in fruits, vegetables, and lean proteins, and low in salt and simple carbohydrates) and exercise (at least 30 minutes of moderate physical activity daily).  Written and verbal instructions provided   The above assessment and management plan was discussed with the patient. The patient verbalized understanding of and has agreed to the management plan. Patient is aware to call the clinic if they develop any new symptoms or if symptoms persist or worsen. Patient is aware when to return to the clinic for a follow-up visit. Patient educated on when it is appropriate to go to the emergency department.   Neale Burly, DNP-FNP [facility] [address] [place], [place] [address] [phone]

## 2023-08-01 NOTE — ED Triage Notes (Signed)
Pt arrived via [facility] from her PCP Office for further evaluation. Pt reports her pain started around noon today while at home. Pt administered 3 NTG before going to her PCP for assistance. Pt then received 4 baby aspirin. Pt now endorsing headache and back and chest pain. Pt reports recent stress.

## 2023-08-01 NOTE — Progress Notes (Deleted)
Subjective:  Patient ID: [name], female    DOB: [birth year], 63 y.o.   MRN: [id number]  Patient Care Team: Dettinger, Elige Radon, MD as PCP - General (Family Medicine) Wendall Stade, MD as PCP - Cardiology (Cardiology) Janalyn Harder, MD (Inactive) as Consulting Physician (Dermatology)   Chief Complaint:  Chest Pain  HPI: [name] is a 63 y.o. female presenting on 08/01/2023 for Chest Pain  Chest Pain   1. Chest pain, unspecified type Patient states that she started with chest pain at noon, 1 hour ago. States that she took 3 nitroglycerin at home each 5 minutes apart and her CP did not resolve. States that it has improved slightly, but remains 4/10. States that pain is sharp and radiating to her back. She denies nausea. Endorses slight shortness of breath. Pain is not reproducible with external pressure. Pain does not change with breathing. She has taken all medications this morning including percocet for back pain. She is not currently on anticoagulation. She has a PMH significant for Takatsubo DCM - no CAD by cath in 2016. Reports that she has never had pain like this before.   Relevant past medical, surgical, family, and social history reviewed and updated as indicated.  Allergies and medications reviewed and updated. Data reviewed: Chart in Epic.   Past Medical History:  Diagnosis Date   Anxiety    Arthritis    back (12/30/2014)   Asthma    as a child, no problems as an adult, has an albuterol inhaler   Cerebral cyst    per brain MRI 07-12-2019 unchanged 6mm cyst inferomedial right frontal lobe   Chronic headaches    Chronic systolic (congestive) heart failure (HCC)    followed by dr Estrella Myrtle   Complication of anesthesia    pt is very sensitive to benzodiazepines, pt stated "almost died"   Compression fracture of lumbar vertebra (HCC) 05/27/2020   per pt L3 and L4   DCM (dilated cardiomyopathy) (HCC)    w/ Takatsubo---  followed by dr Eden Emms---   stress-induced --- cardiac cath 12-30-2014 ef 30-35%, recovered per cardiac MRI 03-15-2015 ef  64%   Depression    Disorder of mastoid    per brain MRI 07-12-2019 persistant large mastoid effusion   Dysuria    Environmental and seasonal allergies    Frequency of urination    GERD (gastroesophageal reflux disease)    History of asthma    childhood   History of esophageal spasm    History of kidney stones    History of melanoma excision 2001   right supraorbital (right forehead and upper eyelid )  s/p  Moh's procedure w/ sln bx,  no recurrence per pt   History of non-ST elevation myocardial infarction (NSTEMI) 12/29/2014   per cath normal coronaries , ef 30-35%---  stress-- induced Takotsubo syndrome    History of palpitations 01/05/2015   STRESS INDUCED   History of parotid cancer 2008   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-2006 s/p  right lateral parotidectomy w/ nerve dissection , modified radical neck dissection ;  completed  x35 fractions raditation 2008;  no recurrence per pt   Hyperlipidemia    Hypertension    Kidney cysts    Left ureteral calculus    Low back pain    Melanoma (HCC) 2001   face   Muscle spasm of back    Myocardial infarction (HCC) 2016   Pneumonia    as a  child   PONV (postoperative nausea and vomiting)    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   Squamous cell carcinoma of skin 02/22/2021   in istu- right forehead ([id number])   Takotsubo syndrome 12/29/2014   cardiologist---  dr Estrella Myrtle--- dx NSTEMI -- stress-induced w/ DCM--  per cath 12-30-2014 normal coronaries , ef 30-35%,  recovered ef per cardiac MRI  ef 64%   Urgency of urination    Wears glasses     Past Surgical History:  Procedure Laterality Date   BACK SURGERY     BREAST BIOPSY Bilateral early 2000's   CARDIAC CATHETERIZATION  2006 (APPROX)  [place]   NORMAL   CARDIAC CATHETERIZATION  12/30/2014   CARDIAC CATHETERIZATION N/A 12/30/2014   Procedure:  Left Heart Cath and Coronary Angiography;  Surgeon: Iran Ouch, MD;  Location: [facility];  Service: Cardiovascular;  Laterality: N/A;   CARDIOVASCULAR STRESS TEST  03-21-2012  DR Eden Emms   NORMAL NUCLEAR STUDY/  NO ISCHEMIA/  EF 63%   COLONOSCOPY  10/2020   COLONOSCOPY WITH PROPOFOL  05/29/2017   CYSTOSCOPY W/ URETERAL STENT PLACEMENT Left 03/26/2013   Procedure: CYSTOSCOPY WITH RETROGRADE PYELOGRAM ;  Surgeon: Milford Cage, MD;  Location: [facility];  Service: Urology;  Laterality: Left;   CYSTOSCOPY WITH RETROGRADE PYELOGRAM, URETEROSCOPY AND STENT PLACEMENT Bilateral 03/19/2013   Procedure: CYSTOSCOPY WITH RETROGRADE PYELOGRAM, BILATERAL URETEROSCOPY AND STENT PLACEMENT LEFT URETER,BILATERAL STONE EXTRACTION , HOLMIUM LASER LEFT URETER;  Surgeon: Milford Cage, MD;  Location: [facility];  Service: Urology;  Laterality: Bilateral;   CYSTOSCOPY WITH STENT PLACEMENT Left 03/26/2013   Procedure: CYSTOSCOPY WITH STENT PLACEMENT;  Surgeon: Milford Cage, MD;  Location: [facility];  Service: Urology;  Laterality: Left;   CYSTOSCOPY/URETEROSCOPY/HOLMIUM LASER/STENT PLACEMENT Left 07/07/2020   Procedure: CYSTOSCOPY LEFT URETEROSCOPY/HOLMIUM LASER/STENT PLACEMENT;  Surgeon: Crista Elliot, MD;  Location: [facility];  Service: Urology;  Laterality: Left;   DIAGNOSTIC LAPAROSCOPY  04/12/2009   ESOPHAGOGASTRODUODENOSCOPY (EGD) WITH PROPOFOL N/A 02/10/2016   Procedure: ESOPHAGOGASTRODUODENOSCOPY (EGD) WITH PROPOFOL;  Surgeon: Bernette Redbird, MD;  Location: [facility];  Service: Endoscopy;  Laterality: N/A;   KIDNEY SURGERY  1966   BILATERAL URETER'S DILATATION   LUMBAR LAMINECTOMY/DECOMPRESSION MICRODISCECTOMY  05/18/2011   Procedure: LUMBAR LAMINECTOMY/DECOMPRESSION MICRODISCECTOMY;  Surgeon: Javier Docker;  Location: [facility];  Service: Orthopedics;  Laterality: Right;  Decompression Lumbar 4-Lumbar 5  Right    (xray)     LUMBAR LAMINECTOMY/DECOMPRESSION MICRODISCECTOMY N/A 05/06/2021   Procedure: Microlumbar decompression L5-S1;  Surgeon: Jene Every, MD;  Location: [facility];  Service: Orthopedics;  Laterality: N/A;  3 C-bed 90 mins   MELANOMA EXCISION WITH SENTINEL LYMPH NODE BIOPSY  2001   moh's procedure/  RIGHT FOREHEAD AND UPPER EYEBROW   RIGHT LATERAL PAROTIDECTOMY W/ NERVE DISSECTION / RIGHT MODIFIED RADICAL NECK DISSECTION SPARING SCM ELEVENTH NERVE AND INTERNAL JUGULAR VEIN  09-12-2006  DR DWIGHT BATES   DR DWIGHT BATES; "inside gland; lots of lymph nodes"    Social History   Socioeconomic History   Marital status: Married    Spouse name: Not on file   Number of children: Not on file   Years of education: Not on file   Highest education level: Not on file  Occupational History   Occupation: Conservation officer, nature at [employer]  Tobacco Use   Smoking status: Never   Smokeless tobacco: Never  Vaping Use   Vaping status: Never Used  Substance and Sexual Activity   Alcohol use: Not Currently    Comment: occasional- 1 drink per month   Drug use: Never   Sexual activity: Not on file  Other Topics Concern   Not on file  Social History Narrative   Right handed   Caffeine 3 cups daily   Lives one story home with basement 1 time weekly   Lives with husband and a dog   Social Drivers of Corporate investment banker Strain: Not on file  Food Insecurity: Not on file  Transportation Needs: Not on file  Physical Activity: Not on file  Stress: Not on file  Social Connections: Not on file  Intimate Partner Violence: Not on file    Outpatient Encounter Medications as of 08/01/2023  Medication Sig   atorvastatin (LIPITOR) 40 MG tablet Take 1 tablet (40 mg total) by mouth daily.   calcium carbonate (TUMS - DOSED IN MG ELEMENTAL CALCIUM) 500 MG chewable tablet Chew 1,000 mg by mouth daily as needed for indigestion or heartburn.   carvedilol (COREG) 3.125 MG tablet Take 1 tablet (3.125 mg total) by mouth 2  (two) times daily with a meal.   cetirizine (ZYRTEC ALLERGY) 10 MG tablet Take 1 tablet (10 mg total) by mouth daily. (Patient taking differently: Take 10 mg by mouth as needed.)   Cholecalciferol (D-3-5 PO) Take 1 Dose by mouth daily.   docusate sodium (COLACE) 100 MG capsule Take 1 capsule (100 mg total) by mouth 2 (two) times daily as needed for mild constipation.   fluticasone (FLONASE) 50 MCG/ACT nasal spray Place 2 sprays into both nostrils daily. (Patient taking differently: Place 2 sprays into both nostrils as needed.)   gabapentin (NEURONTIN) 300 MG capsule Take 300 mg by mouth at bedtime.   ibuprofen (ADVIL) 200 MG tablet Take 200 mg by mouth every 6 (six) hours as needed.   lidocaine (LIDODERM) 5 % Place 1 patch onto the skin daily as needed (pain).   lisinopril (ZESTRIL) 2.5 MG tablet Take 1 tablet (2.5 mg total) by mouth every morning. Please call [phone] to schedule an appointment with Dr. Charlton Haws for future refills. Thank you. 1st attempt.   loratadine-pseudoephedrine (CLARITIN-D 24-HOUR) 10-240 MG 24 hr tablet Take 1 tablet by mouth as needed for allergies.   milk thistle 175 MG tablet Take 175 mg by mouth daily.   nitroGLYCERIN (NITROSTAT) 0.4 MG SL tablet Place 1 tablet (0.4 mg total) under the tongue every 5 (five) minutes as needed for chest pain. DISSOLVE ONE TABLET UNDER THE TONGUE EVERY 5 MINUTES AS NEEDED FOR CHEST PAIN.  DO NOT EXCEED A TOTAL OF 3 DOSES IN 15 MINUTES   NON FORMULARY headache relief with acetamimophen, aspirin   nortriptyline (PAMELOR) 10 MG capsule Take 3 capsules (30 mg total) by mouth at bedtime.   oxyCODONE (ROXICODONE) 5 MG immediate release tablet Take 1 tablet (5 mg total) by mouth every 6 (six) hours as needed for severe pain.   polyethylene glycol (MIRALAX / GLYCOLAX) 17 g packet Take 17 g by mouth daily.   predniSONE (DELTASONE) 5 MG tablet Take 1 dose pk by oral route as directed for 6 days. (Patient not taking: Reported on 07/26/2023)    silver sulfADIAZINE (SILVADENE) 1 % cream Apply 1 application topically daily. Apply to shoulder nightly 7-14 days (Patient taking differently: Apply 1 application  topically as needed. Apply to shoulder nightly 7-14 days)   No facility-administered encounter medications on file as of 08/01/2023.   Allergies  Allergen Reactions   Cephalexin Shortness Of Breath and Rash   Lorazepam Shortness Of Breath and Other (See Comments)    She may be very sensitive to benzo.    Stops breathing    Promethazine     Felt strange, "knocked me out"   Seasonal Ic [Octacosanol] Other (See Comments)    Review of Systems  Cardiovascular:  Positive for chest pain.   Objective:  BP 131/83   Pulse 100   LMP 05/18/2011   O2 WNL  Wt Readings from Last 3 Encounters:  07/26/23 196 lb (88.9 kg)  07/16/23 192 lb (87.1 kg)  05/25/23 195 lb (88.5 kg)    Physical Exam Constitutional:      Appearance: She is well-developed. She is not ill-appearing or diaphoretic.  Cardiovascular:     Rate and Rhythm: Regular rhythm. Tachycardia present.     Heart sounds: Normal heart sounds. Heart sounds not distant.     No systolic murmur is present.     No diastolic murmur is present.  Pulmonary:     Effort: Pulmonary effort is normal.     Breath sounds: No decreased breath sounds, wheezing, rhonchi or rales.  Skin:    General: Skin is warm.     Capillary Refill: Capillary refill takes less than 2 seconds.  Neurological:     General: No focal deficit present.     Mental Status: She is alert and oriented to person, place, and time.  Psychiatric:        Mood and Affect: Mood normal. Mood is not anxious.        Behavior: Behavior normal. Behavior is not agitated.    Results for orders placed or performed in visit on 07/16/23  Cytology - PAP([place])   Collection Time: 07/16/23 10:00 AM  Result Value Ref Range   Adequacy      Satisfactory for evaluation. The presence or absence of an   Adequacy       endocervical/transformation zone component cannot be determined because   Adequacy of atrophy.    Diagnosis      - Negative for intraepithelial lesion or malignancy (NILM)  Microscopic Examination   Collection Time: 07/16/23 10:10 AM   Urine  Result Value Ref Range   WBC, UA 0-5 0 - 5 /hpf   RBC, Urine 0-2 0 - 2 /hpf   Epithelial Cells (non renal) 0-10 0 - 10 /hpf   Renal Epithel, UA None seen None seen /hpf   Bacteria, UA Few (A) None seen/Few   Yeast, UA None seen None seen  Urinalysis, Complete   Collection Time: 07/16/23 10:10 AM  Result Value Ref Range   Specific Gravity, UA 1.020 1.005 - 1.030   pH, UA 5.5 5.0 - 7.5   Color, UA Yellow Yellow   Appearance Ur Clear Clear   Leukocytes,UA Trace (A) Negative   Protein,UA Negative Negative/Trace   Glucose, UA Negative Negative   Ketones, UA Negative Negative   RBC, UA 1+ (A) Negative   Bilirubin, UA Negative Negative   Urobilinogen, Ur 0.2 0.2 - 1.0 mg/dL   Nitrite, UA Negative Negative   Microscopic Examination See below:   CMP14+EGFR   Collection Time: 07/16/23 10:14 AM  Result Value Ref Range   Glucose 89 70 - 99 mg/dL   BUN 14 8 - 27 mg/dL   Creatinine, Ser 4.69 0.57 - 1.00 mg/dL   eGFR 76 >62 XB/MWU/1.32   BUN/Creatinine Ratio 16 12 - 28   Sodium 142  134 - 144 mmol/L   Potassium 4.0 3.5 - 5.2 mmol/L   Chloride 102 96 - 106 mmol/L   CO2 24 20 - 29 mmol/L   Calcium 9.4 8.7 - 10.3 mg/dL   Total Protein 6.3 6.0 - 8.5 g/dL   Albumin 4.5 3.9 - 4.9 g/dL   Globulin, Total 1.8 1.5 - 4.5 g/dL   Bilirubin Total 0.4 0.0 - 1.2 mg/dL   Alkaline Phosphatase 98 44 - 121 IU/L   AST 18 0 - 40 IU/L   ALT 26 0 - 32 IU/L  CBC with Differential/Platelet   Collection Time: 07/16/23 10:14 AM  Result Value Ref Range   WBC 8.6 3.4 - 10.8 x10E3/uL   RBC 4.27 3.77 - 5.28 x10E6/uL   Hemoglobin 13.8 11.1 - 15.9 g/dL   Hematocrit 16.1 09.6 - 46.6 %   MCV 97 79 - 97 fL   MCH 32.3 26.6 - 33.0 pg   MCHC 33.3 31.5 - 35.7 g/dL   RDW 04.5  40.9 - 81.1 %   Platelets 208 150 - 450 x10E3/uL   Neutrophils 72 Not Estab. %   Lymphs 19 Not Estab. %   Monocytes 7 Not Estab. %   Eos 1 Not Estab. %   Basos 0 Not Estab. %   Neutrophils Absolute 6.2 1.4 - 7.0 x10E3/uL   Lymphocytes Absolute 1.7 0.7 - 3.1 x10E3/uL   Monocytes Absolute 0.6 0.1 - 0.9 x10E3/uL   EOS (ABSOLUTE) 0.1 0.0 - 0.4 x10E3/uL   Basophils Absolute 0.0 0.0 - 0.2 x10E3/uL   Immature Granulocytes 1 Not Estab. %   Immature Grans (Abs) 0.1 0.0 - 0.1 x10E3/uL  Lipid panel   Collection Time: 07/16/23 10:14 AM  Result Value Ref Range   Cholesterol, Total 206 (H) 100 - 199 mg/dL   Triglycerides 914 0 - 149 mg/dL   HDL 76 >78 mg/dL   VLDL Cholesterol Cal 20 5 - 40 mg/dL   LDL Chol Calc (NIH) 295 (H) 0 - 99 mg/dL   Chol/HDL Ratio 2.7 0.0 - 4.4 ratio  Urine Culture   Collection Time: 07/16/23 11:42 AM   Specimen: Urine   UR  Result Value Ref Range   Urine Culture, Routine Final report    Organism ID, Bacteria Comment        05/25/2023    8:09 AM 02/20/2023   10:48 AM 02/08/2023    2:40 PM 03/31/2022    2:38 PM 02/03/2022   11:20 AM  Depression screen PHQ 2/9  Decreased Interest 0  0 0 0  Down, Depressed, Hopeless 0 0 0 0 0  PHQ - 2 Score 0 0 0 0 0  Altered sleeping 0 0  0 0  Tired, decreased energy 1 1  0 0  Change in appetite 0 0  0 0  Feeling bad or failure about yourself  0 0  0 0  Trouble concentrating 0 0  0 0  Moving slowly or fidgety/restless 0 0  0 0  Suicidal thoughts 0 0  0 0  PHQ-9 Score 1 1  0 0  Difficult doing work/chores Not difficult at all Not difficult at all  Not difficult at all Not difficult at all       05/25/2023    8:09 AM 02/20/2023   10:48 AM 03/31/2022    2:38 PM 12/29/2021    1:46 PM  GAD 7 : Generalized Anxiety Score  Nervous, Anxious, on Edge 0 0 0 0  Control/stop worrying 0 0  0 0  Worry too much - different things 0 0 0 0  Trouble relaxing 0 0 0 0  Restless 0 0 0 0  Easily annoyed or irritable 0 0 0 0  Afraid - awful  might happen 0 0 0 0  Total GAD 7 Score 0 0 0 0  Anxiety Difficulty Not difficult at all Not difficult at all Not difficult at all Not difficult at all      Pertinent labs & imaging results that were available during my care of the patient were reviewed by me and considered in my medical decision making.  Assessment & Plan:  [name] was seen today for chest pain.  Diagnoses and all orders for this visit:  Chest pain, unspecified type Negative for STEMI on EKG. Inverted [place]. EKG similar to prior from 03/27/23. Provided medication as below. Given patient symptomology, tachycardia, and cardiac history, recommend that she present to ED for further evaluation. Patient does not have another driver available. She agrees to EMS transport. Reviewed cath report from 2016. Reviewed notes from Stanley, MD 02/08/22.  -     EKG 12-Lead -     aspirin chewable tablet 325 mg  Continue all other maintenance medications.  Follow up plan: Return if symptoms worsen or fail to improve.   Continue healthy lifestyle choices, including diet (rich in fruits, vegetables, and lean proteins, and low in salt and simple carbohydrates) and exercise (at least 30 minutes of moderate physical activity daily).  Written and verbal instructions provided   The above assessment and management plan was discussed with the patient. The patient verbalized understanding of and has agreed to the management plan. Patient is aware to call the clinic if they develop any new symptoms or if symptoms persist or worsen. Patient is aware when to return to the clinic for a follow-up visit. Patient educated on when it is appropriate to go to the emergency department.   Neale Burly, DNP-FNP [facility] [address] [place], [place] [address] [phone]

## 2023-08-01 NOTE — Discharge Instructions (Addendum)
Test results today are reassuring.  A prescription for Protonix was sent to your pharmacy.  Take daily to minimize symptoms of reflux.  Follow-up with your PCP to discuss long-term use.  Follow-up with your cardiologist as well.  Return to the emergency department for any new or worsening symptoms of concern.

## 2023-08-01 NOTE — ED Provider Notes (Signed)
[place] EMERGENCY DEPARTMENT AT [facility] Provider Note   CSN: [id number] Arrival date & time: 08/01/23  1347     History  Chief Complaint  Patient presents with   Chest Pain    [name] is a 63 y.o. female.   Chest Pain Associated symptoms: back pain, nausea and shortness of breath   Patient presents for chest pain.  Medical history includes HTN, HLD, GERD, IBS, cardiomyopathy, arthritis, anxiety.  Her cardiologist is Dr. Eden Emms.  She had Takotsubo cardiomyopathy in 2016.  She initially had reduced EF which has normalized on subsequent studies.  She had a clean cath in 2016.  She had a low risk cardiac CT in 2018.  This morning, she was in her normal state of health.  At around noon, she was washing dishes.  She had a sudden onset of pain that radiated from bilateral lower ribs into center of chest.  She describes it as a bear squeezing her.  She took 3 NTG's at home with no improvement.  She lives close by her PCPs office and went there for evaluation.  She was given 324 of ASA.  She was sent to the ED for further evaluation.  Patient's discomfort has since diminished.  It is currently 4/10 in severity.  Current location is substernal with radiation to back.  She denies any other current symptoms.     Home Medications Prior to Admission medications   Medication Sig Start Date End Date Taking? Authorizing Provider  pantoprazole (PROTONIX) 40 MG tablet Take 1 tablet (40 mg total) by mouth daily. 08/01/23 08/31/23 Yes Gloris Manchester, MD  atorvastatin (LIPITOR) 40 MG tablet Take 1 tablet (40 mg total) by mouth daily. 04/13/23  Yes Dettinger, Elige Radon, MD  calcium carbonate (TUMS - DOSED IN MG ELEMENTAL CALCIUM) 500 MG chewable tablet Chew 1,000 mg by mouth daily as needed for indigestion or heartburn.   Yes [provider]  carvedilol (COREG) 3.125 MG tablet Take 1 tablet (3.125 mg total) by mouth 2 (two) times daily with a meal. 06/04/23  Yes Wendall Stade, MD   Cholecalciferol (D-3-5 PO) Take 1 Dose by mouth daily.   Yes [provider]  docusate sodium (COLACE) 100 MG capsule Take 1 capsule (100 mg total) by mouth 2 (two) times daily as needed for mild constipation. 05/06/21  Yes Jene Every, MD  gabapentin (NEURONTIN) 300 MG capsule Take 300 mg by mouth at bedtime. 07/23/23  Yes [provider]  ibuprofen (ADVIL) 200 MG tablet Take 200 mg by mouth every 6 (six) hours as needed for mild pain (pain score 1-3).   Yes [provider]  lidocaine (LIDODERM) 5 % Place 1 patch onto the skin daily as needed (pain). 01/04/21  Yes [provider]  lisinopril (ZESTRIL) 2.5 MG tablet Take 1 tablet (2.5 mg total) by mouth every morning. Please call [phone] to schedule an appointment with Dr. Charlton Haws for future refills. Thank you. 1st attempt. 03/22/23  Yes Wendall Stade, MD  milk thistle 175 MG tablet Take 175 mg by mouth daily.   Yes [provider]  nitroGLYCERIN (NITROSTAT) 0.4 MG SL tablet Place 1 tablet (0.4 mg total) under the tongue every 5 (five) minutes as needed for chest pain. DISSOLVE ONE TABLET UNDER THE TONGUE EVERY 5 MINUTES AS NEEDED FOR CHEST PAIN.  DO NOT EXCEED A TOTAL OF 3 DOSES IN 15 MINUTES 02/08/22  Yes Wendall Stade, MD  nortriptyline (PAMELOR) 10 MG capsule  Take 3 capsules (30 mg total) by mouth at bedtime. 01/31/23 01/31/24 Yes Antony Madura, MD  oxyCODONE (ROXICODONE) 5 MG immediate release tablet Take 1 tablet (5 mg total) by mouth every 6 (six) hours as needed for severe pain. 03/27/23  Yes Idol, Raynelle Fanning, PA-C      Allergies    Cephalexin, Lorazepam, Promethazine, and Seasonal ic [octacosanol]    Review of Systems   Review of Systems  Respiratory:  Positive for shortness of breath.   Cardiovascular:  Positive for chest pain.  Gastrointestinal:  Positive for nausea.  Musculoskeletal:  Positive for back pain.  All other systems reviewed and are negative.   Physical Exam Updated  Vital Signs BP 129/60   Pulse 80   Temp (!) 97.5 F (36.4 C) (Oral)   Resp 19   Ht 5\' 4"  (1.626 m)   Wt 88.9 kg   LMP 05/18/2011   SpO2 98%   BMI 33.64 kg/m  Physical Exam Vitals and nursing note reviewed.  Constitutional:      General: She is not in acute distress.    Appearance: She is well-developed. She is not ill-appearing, toxic-appearing or diaphoretic.  HENT:     Head: Normocephalic and atraumatic.  Eyes:     Conjunctiva/sclera: Conjunctivae normal.  Cardiovascular:     Rate and Rhythm: Normal rate and regular rhythm.     Heart sounds: No murmur heard. Pulmonary:     Effort: Pulmonary effort is normal. No tachypnea or respiratory distress.     Breath sounds: Normal breath sounds.  Chest:     Chest wall: No tenderness.  Abdominal:     Palpations: Abdomen is soft.     Tenderness: There is no abdominal tenderness.  Musculoskeletal:        General: No swelling. Normal range of motion.     Cervical back: Normal range of motion and neck supple.  Skin:    General: Skin is warm and dry.     Coloration: Skin is not cyanotic or pale.  Neurological:     General: No focal deficit present.     Mental Status: She is alert and oriented to person, place, and time.  Psychiatric:        Mood and Affect: Mood normal.        Behavior: Behavior normal.     ED Results / Procedures / Treatments   Labs (all labs ordered are listed, but only abnormal results are displayed) Labs Reviewed  BASIC METABOLIC PANEL - Abnormal; Notable for the following components:      Result Value   Glucose, Bld 116 (*)    All other components within normal limits  CBC WITH DIFFERENTIAL/PLATELET  LIPASE, BLOOD  HEPATIC FUNCTION PANEL  MAGNESIUM  TROPONIN I (HIGH SENSITIVITY)  TROPONIN I (HIGH SENSITIVITY)    EKG EKG Interpretation Date/Time:  Wednesday August 01 2023 14:03:03 EST Ventricular Rate:  102 PR Interval:  142 QRS Duration:  82 QT Interval:  358 QTC Calculation: 466 R  Axis:   -40  Text Interpretation: Sinus tachycardia Left axis deviation Low voltage QRS Cannot rule out Anterior infarct , age undetermined Confirmed by Gloris Manchester [phone]) on 08/01/2023 4:50:38 PM  Radiology CT Angio Chest/Abd/Pel for Dissection W and/or Wo Contrast Result Date: 08/01/2023 CLINICAL DATA:  Chest pain since noon today, headache, back pain, history of melanoma and sarcoma EXAM: CT ANGIOGRAPHY CHEST, ABDOMEN AND PELVIS TECHNIQUE: Non-contrast CT of the chest was initially obtained. Multidetector CT imaging through the chest, abdomen  and pelvis was performed using the standard protocol during bolus administration of intravenous contrast. Multiplanar reconstructed images and MIPs were obtained and reviewed to evaluate the vascular anatomy. RADIATION DOSE REDUCTION: This exam was performed according to the departmental dose-optimization program which includes automated exposure control, adjustment of the mA and/or kV according to patient size and/or use of iterative reconstruction technique. CONTRAST:  OMNIPAQUE IOHEXOL 350 MG/ML SOLN COMPARISON:  08/01/2023, 02/20/2023 FINDINGS: CTA CHEST FINDINGS Cardiovascular: No evidence of thoracic aortic aneurysm or dissection. Minimal atherosclerosis within the aortic arch. The heart is unremarkable without pericardial effusion. There is technically adequate opacification of the pulmonary vasculature. No filling defects or pulmonary emboli. Mediastinum/Nodes: No enlarged mediastinal, hilar, or axillary lymph nodes. Thyroid gland, trachea, and esophagus demonstrate no significant findings. Lungs/Pleura: No acute airspace disease, effusion, or pneumothorax. Musculoskeletal: No acute or destructive bony abnormalities. Reconstructed images demonstrate no additional findings. Review of the MIP images confirms the above findings. CTA ABDOMEN AND PELVIS FINDINGS VASCULAR Aorta: Normal caliber aorta without aneurysm, dissection, vasculitis or significant  stenosis. Mild atherosclerosis. Celiac: Patent without evidence of aneurysm, dissection, vasculitis or significant stenosis. SMA: Patent without evidence of aneurysm, dissection, vasculitis or significant stenosis. Renals: Duplicated right renal artery, and a single left renal artery. The bilateral renal arteries are patent without evidence of aneurysm, dissection, vasculitis, fibromuscular dysplasia or significant stenosis. IMA: Patent without evidence of aneurysm, dissection, vasculitis or significant stenosis. Inflow: Patent without evidence of aneurysm, dissection, vasculitis or significant stenosis. Veins: No obvious venous abnormality within the limitations of this arterial phase study. Review of the MIP images confirms the above findings. NON-VASCULAR Hepatobiliary: Hepatic steatosis. No focal liver abnormality. Gallbladder is unremarkable. Pancreas: Unremarkable. No pancreatic ductal dilatation or surrounding inflammatory changes. Spleen: Normal in size without focal abnormality. Adrenals/Urinary Tract: Chronic scarring upper pole left kidney. There is bilateral renal cortical thinning, stable. No urinary tract calculi or obstructive uropathy. The adrenals and bladder are unremarkable. Stomach/Bowel: No bowel obstruction or ileus. Moderate stool throughout the colon consistent with constipation. No bowel wall thickening or inflammatory change. Normal retrocecal appendix. Lymphatic: No pathologic adenopathy within the abdomen or pelvis. Reproductive: Uterus and bilateral adnexa are unremarkable. Other: No free fluid or free intraperitoneal gas. No abdominal wall hernia. Musculoskeletal: No acute or destructive bony abnormalities. Reconstructed images demonstrate no additional findings. Review of the MIP images confirms the above findings. IMPRESSION: Vascular: 1. No evidence of thoracoabdominal aortic aneurysm or dissection. 2. No evidence of pulmonary embolus. 3.  Aortic Atherosclerosis (ICD10-I70.0).  Nonvascular: 1. No acute intrathoracic, intra-abdominal, or intrapelvic process. 2. Moderate fecal retention throughout the colon consistent with constipation. No obstruction or ileus. 3. Hepatic steatosis. Electronically Signed   By: Sharlet Salina M.D.   On: 08/01/2023 19:07   DG Chest 2 View Result Date: 08/01/2023 CLINICAL DATA:  Chest pain. EXAM: CHEST - 2 VIEW COMPARISON:  None Available. FINDINGS: Bilateral lung fields are clear. Bilateral costophrenic angles are clear. Normal cardio-mediastinal silhouette. No acute osseous abnormalities. The soft tissues are within normal limits. IMPRESSION: No active cardiopulmonary disease. Electronically Signed   By: Jules Schick M.D.   On: 08/01/2023 16:21    Procedures Procedures    Medications Ordered in ED Medications  alum & mag hydroxide-simeth (MAALOX/MYLANTA) 200-200-20 MG/5ML suspension 30 mL (30 mLs Oral Given 08/01/23 1754)    And  lidocaine (XYLOCAINE) 2 % viscous mouth solution 15 mL (15 mLs Oral Given 08/01/23 1754)  famotidine (PEPCID) IVPB 20 mg premix (0 mg Intravenous Stopped 08/01/23 1840)  iohexol (OMNIPAQUE) 350 MG/ML injection 100 mL (100 mLs Intravenous Contrast Given 08/01/23 1718)    ED Course/ Medical Decision Making/ A&P                                 Medical Decision Making Amount and/or Complexity of Data Reviewed Labs: ordered. Radiology: ordered.  Risk OTC drugs. Prescription drug management.   This patient presents to the ED for concern of chest pain, this involves an extensive number of treatment options, and is a complaint that carries with it a high risk of complications and morbidity.  The differential diagnosis includes ACS, dissection, pancreatitis, GERD, PUD, pericarditis, anxiety   Co morbidities that complicate the patient evaluation  HTN, HLD, GERD, IBS, cardiomyopathy, arthritis, anxiety   Additional history obtained:  Additional history obtained from N/A External records from outside  source obtained and reviewed including EMR   Lab Tests:  I Ordered, and personally interpreted labs.  The pertinent results include: Normal hemoglobin, no leukocytosis, normal kidney function, normal electrolytes, normal troponins x 2   Imaging Studies ordered:  I ordered imaging studies including chest x-ray, CTA dissection study I independently visualized and interpreted imaging which showed no acute findings I agree with the radiologist interpretation   Cardiac Monitoring: / EKG:  The patient was maintained on a cardiac monitor.  I personally viewed and interpreted the cardiac monitored which showed an underlying rhythm of: Sinus rhythm  Problem List / ED Course / Critical interventions / Medication management  Patient presenting after acute onset of chest pain earlier today.  She was seen at PCPs office and given 324 of ASA.  On arrival in ED, patient is well-appearing.  She describes some mild residual substernal pain radiating to back, currently 4/10 in severity.  She does have a history of reflux and no longer takes Protonix due to financial constraints.  Initial lab work, including troponin is reassuring.  GI cocktail was ordered.  Will obtain dissection study and continue to monitor.  Patient had improved symptoms after GI cocktail.  She is agreeable to resume Protonix.  Prescription was ordered.  CTA did not show acute findings.  Remaining lab work was reassuring.  Patient was discharged in stable condition. I ordered medication including GI cocktail for empiric treatment of GERD Reevaluation of the patient after these medicines showed that the patient improved I have reviewed the patients home medicines and have made adjustments as needed   Social Determinants of Health:  Has access to outpatient care        Final Clinical Impression(s) / ED Diagnoses Final diagnoses:  Chest pain, unspecified type    Rx / DC Orders ED Discharge Orders          Ordered     pantoprazole (PROTONIX) 40 MG tablet  Daily        08/01/23 1825    Ambulatory referral to Cardiology       Comments: If you have not heard from the Cardiology office within the next 72 hours please call [phone].   08/01/23 1929              Gloris Manchester, MD 08/01/23 [phone]

## 2023-08-08 ENCOUNTER — Telehealth: Payer: Self-pay

## 2023-08-08 NOTE — Telephone Encounter (Signed)
 [facility] called and patient was calling them about the charge for August 2024 labs. It was a different Dr. Name on charge. Dr. Loleta Chance ordered the Lab for Vitamin D and it is sent to [facility].

## 2023-08-09 NOTE — Progress Notes (Signed)
 CARDIOLOGY OFFICE NOTE  Date:  08/16/2023    [name] Date of Birth: 31-Jan-1961 Medical Record #[id number]  PCP:  Dettinger, Elige Radon, MD  Cardiologist:  Eden Emms   No chief complaint on file.    History of Present Illness: [name] is a 63 y.o. female who presents for f/u Takatsubo DCM, Abnormal ECG, labile BP and chest pain   She has a history of chronically abnormal EKG. Remote normal cath in [facility] many years ago. Admitted in 2016 with chest pain - found to have Tkatsubo with EF 45% - no CAD by cath - MRI in 02/2015 showed normal EF. Hospital visit in 06/2015 for chest pain with negative evaluation.   Cardiac CT done 05/15/17 normal with calcium score 0  Seen in [place] for SSCP 07/06/18 R/O normal CXR thought to have gas Reviewed All records Troponin negative x 2   Has anxiety and depression   Seen in ER 08/20/19 for chest pain right sided ? Similar to esophageal pain with spasm ECG and troponins normal Told to f/u with GI   Now Working 5 days/week in pharmacy [employer]   Husband also a patient of mine and she gets overwhelmed at times with his health   Went to [place] for [employer] event with her husband He is a founder of large group and known Pharmacologist poorly last few months Seeing ENT/GI Headaches with sinus pain sneezing and cough  Had atypical SSCP earlier this month ? Esophageal EMT did ECG I reviewed and normal other than LAD   Seen in ED for chest pain 08/01/23. Associated with back pain and nausea with dyspnea. CTA dissection protocol negative Troponin negative x 2 And no acute ECG changes. Rx with GI cocktail and put back on protonix which she had stooped for financial reasons.      Past Medical History:  Diagnosis Date   Anxiety    Arthritis    back (12/30/2014)   Asthma    as a child, no problems as an adult, has an albuterol inhaler   Cerebral cyst    per brain MRI 07-12-2019 unchanged 6mm cyst inferomedial right frontal  lobe   Chronic headaches    Chronic systolic (congestive) heart failure (HCC)    followed by dr Estrella Myrtle   Complication of anesthesia    pt is very sensitive to benzodiazepines, pt stated "almost died"   Compression fracture of lumbar vertebra (HCC) 05/27/2020   per pt L3 and L4   DCM (dilated cardiomyopathy) (HCC)    w/ Takatsubo---  followed by dr Eden Emms---  stress-induced --- cardiac cath 12-30-2014 ef 30-35%, recovered per cardiac MRI 03-15-2015 ef  64%   Depression    Disorder of mastoid    per brain MRI 07-12-2019 persistant large mastoid effusion   Dysuria    Environmental and seasonal allergies    Frequency of urination    GERD (gastroesophageal reflux disease)    History of asthma    childhood   History of esophageal spasm    History of kidney stones    History of melanoma excision 2001   right supraorbital (right forehead and upper eyelid )  s/p  Moh's procedure w/ sln bx,  no recurrence per pt   History of non-ST elevation myocardial infarction (NSTEMI) 12/29/2014   per cath normal coronaries , ef 30-35%---  stress-- induced Takotsubo syndrome    History of palpitations 01/05/2015   STRESS INDUCED   History of  parotid cancer 2008   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-2006 s/p  right lateral parotidectomy w/ nerve dissection , modified radical neck dissection ;  completed  x35 fractions raditation 2008;  no recurrence per pt   Hyperlipidemia    Hypertension    Kidney cysts    Left ureteral calculus    Low back pain    Melanoma (HCC) 2001   face   Muscle spasm of back    Myocardial infarction (HCC) 2016   Pneumonia    as a child   PONV (postoperative nausea and vomiting)    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   Squamous cell carcinoma of skin 02/22/2021   in [place]- right forehead (CX35FU)   Takotsubo syndrome 12/29/2014   cardiologist---  dr Estrella Myrtle--- dx NSTEMI -- stress-induced w/ DCM--  per cath 12-30-2014 normal coronaries ,  ef 30-35%,  recovered ef per cardiac MRI  ef 64%   Urgency of urination    Wears glasses     Past Surgical History:  Procedure Laterality Date   BACK SURGERY     BREAST BIOPSY Bilateral early 2000's   CARDIAC CATHETERIZATION  2006 (APPROX)  [place]   NORMAL   CARDIAC CATHETERIZATION  12/30/2014   CARDIAC CATHETERIZATION N/A 12/30/2014   Procedure: Left Heart Cath and Coronary Angiography;  Surgeon: Iran Ouch, MD;  Location: [facility];  Service: Cardiovascular;  Laterality: N/A;   CARDIOVASCULAR STRESS TEST  03-21-2012  DR Eden Emms   NORMAL NUCLEAR STUDY/  NO ISCHEMIA/  EF 63%   COLONOSCOPY  10/2020   COLONOSCOPY WITH PROPOFOL  05/29/2017   CYSTOSCOPY W/ URETERAL STENT PLACEMENT Left 03/26/2013   Procedure: CYSTOSCOPY WITH RETROGRADE PYELOGRAM ;  Surgeon: Milford Cage, MD;  Location: [facility];  Service: Urology;  Laterality: Left;   CYSTOSCOPY WITH RETROGRADE PYELOGRAM, URETEROSCOPY AND STENT PLACEMENT Bilateral 03/19/2013   Procedure: CYSTOSCOPY WITH RETROGRADE PYELOGRAM, BILATERAL URETEROSCOPY AND STENT PLACEMENT LEFT URETER,BILATERAL STONE EXTRACTION , HOLMIUM LASER LEFT URETER;  Surgeon: Milford Cage, MD;  Location: [facility];  Service: Urology;  Laterality: Bilateral;   CYSTOSCOPY WITH STENT PLACEMENT Left 03/26/2013   Procedure: CYSTOSCOPY WITH STENT PLACEMENT;  Surgeon: Milford Cage, MD;  Location: [facility];  Service: Urology;  Laterality: Left;   CYSTOSCOPY/URETEROSCOPY/HOLMIUM LASER/STENT PLACEMENT Left 07/07/2020   Procedure: CYSTOSCOPY LEFT URETEROSCOPY/HOLMIUM LASER/STENT PLACEMENT;  Surgeon: Crista Elliot, MD;  Location[facility];  Service: Urology;  Laterality: Left;   DIAGNOSTIC LAPAROSCOPY  04/12/2009   ESOPHAGOGASTRODUODENOSCOPY (EGD) WITH PROPOFOL N/A 02/10/2016   Procedure: ESOPHAGOGASTRODUODENOSCOPY (EGD) WITH PROPOFOL;  Surgeon: Bernette Redbird, MD;  Location: [facility];  Service: Endoscopy;  Laterality: N/A;   KIDNEY SURGERY  1966   BILATERAL URETER'S DILATATION   LUMBAR LAMINECTOMY/DECOMPRESSION MICRODISCECTOMY  05/18/2011   Procedure: LUMBAR LAMINECTOMY/DECOMPRESSION MICRODISCECTOMY;  Surgeon: Javier Docker;  Location: [facility];  Service: Orthopedics;  Laterality: Right;  Decompression Lumbar 4-Lumbar 5  Right    (xray)    LUMBAR LAMINECTOMY/DECOMPRESSION MICRODISCECTOMY N/A 05/06/2021   Procedure: Microlumbar decompression L5-S1;  Surgeon: Jene Every, MD;  Location: [facility];  Service: Orthopedics;  Laterality: N/A;  3 C-bed 90 mins   MELANOMA EXCISION WITH SENTINEL LYMPH NODE BIOPSY  2001   moh's procedure/  RIGHT FOREHEAD AND UPPER EYEBROW   RIGHT LATERAL PAROTIDECTOMY W/ NERVE DISSECTION / RIGHT MODIFIED RADICAL NECK DISSECTION SPARING SCM ELEVENTH NERVE AND INTERNAL JUGULAR VEIN  09-12-2006  DR DWIGHT BATES   DR DWIGHT BATES; "inside gland; lots of lymph nodes"     Medications: Current Meds  Medication Sig   atorvastatin (LIPITOR) 40 MG tablet Take 1 tablet (40 mg total) by mouth daily.   calcium carbonate (TUMS - DOSED IN MG ELEMENTAL CALCIUM) 500 MG chewable tablet Chew 1,000 mg by mouth daily as needed for indigestion or heartburn.   carvedilol (COREG) 3.125 MG tablet Take 1 tablet (3.125 mg total) by mouth 2 (two) times daily with a meal.   Cholecalciferol (D-3-5 PO) Take 1 Dose by mouth daily.   docusate sodium (COLACE) 100 MG capsule Take 1 capsule (100 mg total) by mouth 2 (two) times daily as needed for mild constipation.   gabapentin (NEURONTIN) 300 MG capsule Take 300 mg by mouth at bedtime.   ibuprofen (ADVIL) 200 MG tablet Take 200 mg by mouth every 6 (six) hours as needed for mild pain (pain score 1-3).   lidocaine (LIDODERM) 5 % Place 1 patch onto the skin daily as needed (pain).   lisinopril (ZESTRIL) 2.5 MG tablet Take 1 tablet (2.5 mg total) by mouth every morning. Please call [phone] to schedule an appointment with  Dr. Charlton Haws for future refills. Thank you. 1st attempt.   meloxicam (MOBIC) 15 MG tablet Take 15 mg by mouth daily.   milk thistle 175 MG tablet Take 175 mg by mouth daily.   nitroGLYCERIN (NITROSTAT) 0.4 MG SL tablet Place 1 tablet (0.4 mg total) under the tongue every 5 (five) minutes as needed for chest pain. DISSOLVE ONE TABLET UNDER THE TONGUE EVERY 5 MINUTES AS NEEDED FOR CHEST PAIN.  DO NOT EXCEED A TOTAL OF 3 DOSES IN 15 MINUTES   nortriptyline (PAMELOR) 10 MG capsule Take 3 capsules (30 mg total) by mouth at bedtime.   oxyCODONE (ROXICODONE) 5 MG immediate release tablet Take 1 tablet (5 mg total) by mouth every 6 (six) hours as needed for severe pain.   oxyCODONE-acetaminophen (PERCOCET/ROXICET) 5-325 MG tablet Take 1 tablet by mouth daily.   pantoprazole (PROTONIX) 40 MG tablet Take 1 tablet (40 mg total) by mouth daily.     Allergies: Allergies  Allergen Reactions   Cephalexin Rash and Shortness Of Breath    Keflex   Lorazepam Shortness Of Breath and Other (See Comments)    She may be very sensitive to benzo.    Stops breathing    Promethazine     Felt strange, "knocked me out"   Seasonal Ic [Octacosanol] Other (See Comments)    Social History: The patient  reports that she has never smoked. She has never used smokeless tobacco. She reports that she does not currently use alcohol. She reports that she does not use drugs.   Family History: The patient's family history includes Breast cancer in her mother; COPD in her mother; Cancer in her father; Colon cancer in her sister; Dementia in her mother; Heart disease in her father; Hyperlipidemia in her father; Hypertension in her father and mother.   Review of Systems: Please see the history of present illness.   Otherwise, the review of systems is positive for none.   All other systems are reviewed and negative.   Physical Exam: VS:  BP 116/72   Pulse 82   Ht 5\' 4"  (1.626 m)   Wt 199 lb 9.6 oz (90.5 kg)   LMP  05/18/2011   SpO2 95%   BMI 34.26 kg/m  .  BMI Body mass index is 34.26  kg/m.  Wt Readings from Last 3 Encounters:  08/16/23 199 lb 9.6 oz (90.5 kg)  08/01/23 195 lb 15.8 oz (88.9 kg)  07/26/23 196 lb (88.9 kg)   Affect appropriate Healthy:  appears stated age HEENT: normal Neck supple with no adenopathy JVP normal no bruits no thyromegaly Lungs clear with no wheezing and good diaphragmatic motion Heart:  S1/S2 no murmur, no rub, gallop or click PMI normal Abdomen: benighn, BS positve, no tenderness, no AAA no bruit.  No HSM or HJR Distal pulses intact with no bruits No edema Neuro non-focal Skin warm and dry No muscular weakness    LABORATORY DATA:  EKG:  08/01/23 SR rate 102 low voltage poor R wave progression   Lab Results  Component Value Date   WBC 4.7 08/01/2023   HGB 13.2 08/01/2023   HCT 38.5 08/01/2023   PLT 180 08/01/2023   GLUCOSE 116 (H) 08/01/2023   CHOL 206 (H) 07/16/2023   TRIG 113 07/16/2023   HDL 76 07/16/2023   LDLCALC 110 (H) 07/16/2023   ALT 32 08/01/2023   AST 27 08/01/2023   NA 141 08/01/2023   K 4.4 08/01/2023   CL 103 08/01/2023   CREATININE 0.81 08/01/2023   BUN 11 08/01/2023   CO2 30 08/01/2023   TSH 1.530 04/13/2023   INR 1.10 12/30/2014   HGBA1C 5.5 01/03/2017     BNP (last 3 results) No results for input(s): "BNP" in the last 8760 hours.  ProBNP (last 3 results) No results for input(s): "PROBNP" in the last 8760 hours.   Other Studies Reviewed Today:  Cardiac MRI IMPRESSION 02/2015: 1) Normal LV size and function   2) Quantitative EF 64% no RWMA;s   3) No scar tissue or hyperenhancement   Overall findings consistent with Takatsubo DCM   Electronically Signed   By: Charlton Haws M.D.   On: 03/18/2015 16:52   Echo Study Conclusions from 12/2014   - Left ventricle: The cavity size was normal. Systolic function was   mildly reduced. The estimated ejection fraction was 45%. Diffuse   hypokinesis. Doppler  parameters are consistent with abnormal left   ventricular relaxation (grade 1 diastolic dysfunction). There was   no evidence of elevated ventricular filling pressure by Doppler   parameters. - Aortic valve: Trileaflet; normal thickness leaflets. There was   trivial regurgitation. - Aortic root: The aortic root was normal in size. - Mitral valve: Structurally normal valve. There was no   regurgitation. - Left atrium: The atrium was normal in size. - Right ventricle: Systolic function was normal. - Right atrium: The atrium was normal in size. - Tricuspid valve: There was trivial regurgitation. - Pulmonic valve: There was no regurgitation. - Pulmonary arteries: Systolic pressure was within the normal   range. - Inferior vena cava: The vessel was normal in size. - Pericardium, extracardiac: There was no pericardial effusion.    Procedures   Left Heart Cath and Coronary Angiography 12/2014  Conclusion   1. Normal coronary arteries. 2. Moderately to severely reduced LV systolic function with an ejection fraction of 30-35% with severe hypokinesis of the mid distal anterior, apical and mid to distal inferior walls consistent with stress-induced cardiomyopathy. 3. Mildly elevated left ventricular end-diastolic pressure.   Recommendations: Recommend a small dose beta blocker and ACE inhibitor. Avoid stress. Monitor for at least another day and possible discharge home tomorrow if she remains stable.     Assessment/Plan:  1. Chest pain: no CAD cath 2016  Normal cardiac CT with calcium score 0 05/15/17  ER visit for chest pain 08/01/23 ? GI related CTA negative dissection protocol and PE. R/O Shared decision making favor cardiac CTA to further risk stratify  2. HTN - improved continue current meds   3. Takotsubo DCM  - EF 64% recovered by MRI 03/18/15 . She has no symptoms of CHF. Would keep on her current regimen for now.  At risk for recurrence given current anxiety and depression  Update TTE given recent ER visit   4. HLD:  on statin LDL 105 acceptable with calcium score 0   5. GERD:  low carb diet continue pantoprazole   TTE Cardiac CTA Lopressor 100 mg 2 hours prior to test BMET  F/U in a year   Charlton Haws

## 2023-08-09 NOTE — Progress Notes (Signed)
 Erroneous encounter

## 2023-08-16 ENCOUNTER — Encounter: Payer: Self-pay | Admitting: Cardiovascular Disease

## 2023-08-16 ENCOUNTER — Telehealth: Payer: Self-pay | Admitting: Family Medicine

## 2023-08-16 ENCOUNTER — Ambulatory Visit: Payer: BC Managed Care – PPO | Attending: Cardiovascular Disease | Admitting: Cardiovascular Disease

## 2023-08-16 ENCOUNTER — Other Ambulatory Visit: Payer: Self-pay

## 2023-08-16 VITALS — BP 116/72 | HR 82 | Ht 64.0 in | Wt 199.6 lb

## 2023-08-16 DIAGNOSIS — Z01818 Encounter for other preprocedural examination: Secondary | ICD-10-CM

## 2023-08-16 DIAGNOSIS — I10 Essential (primary) hypertension: Secondary | ICD-10-CM

## 2023-08-16 DIAGNOSIS — E782 Mixed hyperlipidemia: Secondary | ICD-10-CM

## 2023-08-16 DIAGNOSIS — R079 Chest pain, unspecified: Secondary | ICD-10-CM | POA: Diagnosis not present

## 2023-08-16 DIAGNOSIS — I420 Dilated cardiomyopathy: Secondary | ICD-10-CM | POA: Diagnosis not present

## 2023-08-16 MED ORDER — METOPROLOL TARTRATE 100 MG PO TABS: 100.0000 mg | ORAL_TABLET | Freq: Once | ORAL | 0 refills | Status: DC

## 2023-08-16 NOTE — Patient Instructions (Addendum)
 Medication Instructions:  Your physician recommends that you continue on your current medications as directed. Please refer to the Current Medication list given to you today.  *If you need a refill on your cardiac medications before your next appointment, please call your pharmacy*  Lab Work: Your physician recommends that you have lab work today- BMET  If you have labs (blood work) drawn today and your tests are completely normal, you will receive your results only by: MyChart Message (if you have MyChart) OR A paper copy in the mail If you have any lab test that is abnormal or we need to change your treatment, we will call you to review the results.  Testing/Procedures: Your physician has requested that you have an echocardiogram. Echocardiography is a painless test that uses sound waves to create images of your heart. It provides your doctor with information about the size and shape of your heart and how well your heart's chambers and valves are working. This procedure takes approximately one hour. There are no restrictions for this procedure. Please do NOT wear cologne, perfume, aftershave, or lotions (deodorant is allowed). Please arrive 15 minutes prior to your appointment time.  Please note: We ask at that you not bring children with you during ultrasound (echo/ vascular) testing. Due to room size and safety concerns, children are not allowed in the ultrasound rooms during exams. Our front office staff cannot provide observation of children in our lobby area while testing is being conducted. An adult accompanying a patient to their appointment will only be allowed in the ultrasound room at the discretion of the ultrasound technician under special circumstances. We apologize for any inconvenience. Follow-Up: At [facility], you and your health needs are our priority.  As part of our continuing mission to provide you with exceptional heart care, we have created designated Provider  Care Teams.  These Care Teams include your primary Cardiologist (physician) and Advanced Practice Providers (APPs -  Physician Assistants and Nurse Practitioners) who all work together to provide you with the care you need, when you need it.  We recommend signing up for the patient portal called "MyChart".  Sign up information is provided on this After Visit Summary.  MyChart is used to connect with patients for Virtual Visits (Telemedicine).  Patients are able to view lab/test results, encounter notes, upcoming appointments, etc.  Non-urgent messages can be sent to your provider as well.   To learn more about what you can do with MyChart, go to [email or web address].    Your next appointment:   1 year(s)  Provider:   Charlton Haws, MD     Other Instructions   Your cardiac CT will be scheduled at one of the below locations:   [facility] [address] [place], [place] [address] [phone]  If scheduled at [facility], please arrive at the [facility] and Children's Entrance (Entrance C2) of [facility] 30 minutes prior to test start time. You can use the FREE valet parking offered at entrance C (encouraged to control the heart rate for the test)  Proceed to the [facility] Radiology Department (first floor) to check-in and test prep.  All radiology patients and guests should use entrance C2 at [facility], accessed from [facility], even though the hospital's physical address listed is [address].     Please follow these instructions carefully (unless otherwise directed):  On the Night Before the Test: Be sure to Drink plenty of water.  Do not consume any caffeinated/decaffeinated beverages or chocolate 12 hours prior to your test. Do not take any antihistamines 12 hours prior to your test.  On the Day of the Test: Drink plenty of water until 1 hour prior to the test. Do not eat any food 1 hour prior to test. You may  take your regular medications prior to the test.  Take metoprolol (Lopressor) 100 mg two hours prior to test. If you take Furosemide/Hydrochlorothiazide/Spironolactone/Chlorthalidone, please HOLD on the morning of the test. Patients who wear a continuous glucose monitor MUST remove the device prior to scanning. FEMALES- please wear underwire-free bra if available, avoid dresses & tight clothing  After the Test: Drink plenty of water. After receiving IV contrast, you may experience a mild flushed feeling. This is normal. On occasion, you may experience a mild rash up to 24 hours after the test. This is not dangerous. If this occurs, you can take Benadryl 25 mg, Zyrtec, Claritin, or Allegra and increase your fluid intake. (Patients taking Tikosyn should avoid Benadryl, and may take Zyrtec, Claritin, or Allegra) If you experience trouble breathing, this can be serious. If it is severe call 911 IMMEDIATELY. If it is mild, please call our office.  We will call to schedule your test 2-4 weeks out understanding that some insurance companies will need an authorization prior to the service being performed.   For more information and frequently asked questions, please visit our website : [email or web address]  For non-scheduling related questions, please contact the cardiac imaging nurse navigator should you have any questions/concerns: Cardiac Imaging Nurse Navigators Direct Office Dial: [phone]   For scheduling needs, including cancellations and rescheduling, please call [place], [phone].       1st Floor: - Lobby - Registration  - Pharmacy  - Lab - Cafe  2nd Floor: - PV Lab - Diagnostic Testing (echo, CT, nuclear med)  3rd Floor: - Vacant  4th Floor: - TCTS (cardiothoracic surgery) - AFib Clinic - Structural Heart Clinic - Vascular Surgery  - Vascular Ultrasound  5th Floor: - HeartCare Cardiology (general and EP) - Clinical Pharmacy for coumadin,  hypertension, lipid, weight-loss medications, and med management appointments    Valet parking services will be available as well.

## 2023-08-17 ENCOUNTER — Telehealth: Payer: Self-pay | Admitting: Cardiovascular Disease

## 2023-08-17 LAB — BASIC METABOLIC PANEL
BUN/Creatinine Ratio: 20 (ref 12–28)
BUN: 18 mg/dL (ref 8–27)
CO2: 25 mmol/L (ref 20–29)
Calcium: 9.5 mg/dL (ref 8.7–10.3)
Chloride: 104 mmol/L (ref 96–106)
Creatinine, Ser: 0.88 mg/dL (ref 0.57–1.00)
Glucose: 97 mg/dL (ref 70–99)
Potassium: 4 mmol/L (ref 3.5–5.2)
Sodium: 145 mmol/L — ABNORMAL HIGH (ref 134–144)
eGFR: 73 mL/min/{1.73_m2} (ref >59)

## 2023-08-17 NOTE — Telephone Encounter (Signed)
   Pre-operative Risk Assessment    Patient Name: [name]  DOB: July 28, 1960 MRN: [id number]   Date of last office visit: 08/16/2023 Date of next office visit: NONE   Request for Surgical Clearance    Procedure:   left knee scope, partial medial meniscectomy, debridement   Date of Surgery:  Clearance TBD                                Surgeon:  Dr. Jene Every Surgeon's Group or Practice Name:  [facility] Phone number:  [phone] Fax number:  [phone] Rosalva Ferron   Type of Clearance Requested:   - Medical    Type of Anesthesia:  General    Additional requests/questions:    Signed, Royann Shivers   08/17/2023, 10:50 AM

## 2023-08-25 DIAGNOSIS — N3010 Interstitial cystitis (chronic) without hematuria: Secondary | ICD-10-CM | POA: Insufficient documentation

## 2023-08-25 NOTE — Progress Notes (Unsigned)
 Name: [name] DOB: 1961/05/27 MRN: [id number]  History of Present Illness: [name] is a 63 y.o. female who presents today as a new patient at [facility] [place]. Previously seen by Dr. Earlene Plater at [facility].  - GU history includes: Interstitial cystitis with urinary frequency and urgency. Chronic pelvic pain. Kidney stones. - Reports prior stone procedures; was previously followed at [facility].  - 08/01/2023: CT showed no GU stones, masses, or hydronephrosis; bladder unremarkable. Moderate fecal retention throughout the colon consistent with constipation. 4. Prior GU surgery: As a child she was having a lot of bedwetting and for reasons she cannot recall she had some kind of bilateral distal ureteral reconstruction.  She denies bladder pain but does report chronic dysuria, frequency, and urgency. Voiding 10-12x/day. Reports rare nocturia. She reports history of chronic microscopic hematuria but denies history of gross hematuria. Denies recent flank or abdominal pain suggestive of kidney stone episodes. Denies urinary incontinence, hesitancy, straining to void, or sensations of incomplete emptying.   Current treatment regimen includes: - Percocet 5-325 mg (for torn left meniscus; awaiting surgery) - Gabapentin 300 mg 3x/day - Nortriptyline  Previous treatments have included: - Hydroxyzine - Amitriptyline  Medications: Current Outpatient Medications  Medication Sig Dispense Refill   atorvastatin (LIPITOR) 40 MG tablet Take 1 tablet (40 mg total) by mouth daily. 90 tablet 3   calcium carbonate (TUMS - DOSED IN MG ELEMENTAL CALCIUM) 500 MG chewable tablet Chew 1,000 mg by mouth daily as needed for indigestion or heartburn.     carvedilol (COREG) 3.125 MG tablet Take 1 tablet (3.125 mg total) by mouth 2 (two) times daily with a meal. 180 tablet 0   Cholecalciferol (D-3-5 PO) Take 1 Dose by mouth daily.     docusate sodium (COLACE) 100 MG  capsule Take 1 capsule (100 mg total) by mouth 2 (two) times daily as needed for mild constipation. 30 capsule 1   estradiol (ESTRACE) 0.1 MG/GM vaginal cream Discard plastic applicator. Insert a blueberry size amount (approximately 1 gram) of cream on fingertip inside vagina at bedtime every night for 1 week then every other night. For long term use. 30 g 3   gabapentin (NEURONTIN) 300 MG capsule Take 300 mg by mouth at bedtime.     ibuprofen (ADVIL) 200 MG tablet Take 200 mg by mouth every 6 (six) hours as needed for mild pain (pain score 1-3).     lidocaine (LIDODERM) 5 % Place 1 patch onto the skin daily as needed (pain).     lisinopril (ZESTRIL) 2.5 MG tablet Take 1 tablet (2.5 mg total) by mouth every morning. Please call [phone] to schedule an appointment with Dr. Charlton Haws for future refills. Thank you. 1st attempt. 30 tablet 0   meloxicam (MOBIC) 15 MG tablet Take 15 mg by mouth daily.     milk thistle 175 MG tablet Take 175 mg by mouth daily.     nitroGLYCERIN (NITROSTAT) 0.4 MG SL tablet Place 1 tablet (0.4 mg total) under the tongue every 5 (five) minutes as needed for chest pain. DISSOLVE ONE TABLET UNDER THE TONGUE EVERY 5 MINUTES AS NEEDED FOR CHEST PAIN.  DO NOT EXCEED A TOTAL OF 3 DOSES IN 15 MINUTES 25 tablet 3   nortriptyline (PAMELOR) 10 MG capsule Take 3 capsules (30 mg total) by mouth at bedtime. 270 capsule 3   oxyCODONE-acetaminophen (PERCOCET/ROXICET) 5-325 MG tablet Take 1 tablet by mouth daily.     pantoprazole (PROTONIX) 40  MG tablet Take 1 tablet (40 mg total) by mouth daily. 30 tablet 0   metoprolol tartrate (LOPRESSOR) 100 MG tablet Take 1 tablet (100 mg total) by mouth once for 1 dose. Take 90-120 minutes prior to scan. Hold for SBP less than 110. 1 tablet 0   No current facility-administered medications for this visit.    Allergies: Allergies  Allergen Reactions   Cephalexin Rash and Shortness Of Breath    Keflex   Lorazepam Shortness Of Breath and Other  (See Comments)    She may be very sensitive to benzo.    Stops breathing    Promethazine     Felt strange, "knocked me out"   Seasonal Ic [Octacosanol] Other (See Comments)    Past Medical History:  Diagnosis Date   Anxiety    Arthritis    back (12/30/2014)   Asthma    as a child, no problems as an adult, has an albuterol inhaler   Cerebral cyst    per brain MRI 07-12-2019 unchanged 6mm cyst inferomedial right frontal lobe   Chronic headaches    Chronic systolic (congestive) heart failure (HCC)    followed by dr Estrella Myrtle   Complication of anesthesia    pt is very sensitive to benzodiazepines, pt stated "almost died"   Compression fracture of lumbar vertebra (HCC) 05/27/2020   per pt L3 and L4   DCM (dilated cardiomyopathy) (HCC)    w/ Takatsubo---  followed by dr Eden Emms---  stress-induced --- cardiac cath 12-30-2014 ef 30-35%, recovered per cardiac MRI 03-15-2015 ef  64%   Depression    Disorder of mastoid    per brain MRI 07-12-2019 persistant large mastoid effusion   Dysuria    Environmental and seasonal allergies    Frequency of urination    GERD (gastroesophageal reflux disease)    History of asthma    childhood   History of esophageal spasm    History of kidney stones    History of melanoma excision 2001   right supraorbital (right forehead and upper eyelid )  s/p  Moh's procedure w/ sln bx,  no recurrence per pt   History of non-ST elevation myocardial infarction (NSTEMI) 12/29/2014   per cath normal coronaries , ef 30-35%---  stress-- induced Takotsubo syndrome    History of palpitations 01/05/2015   STRESS INDUCED   History of parotid cancer 2008   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-2006 s/p  right lateral parotidectomy w/ nerve dissection , modified radical neck dissection ;  completed  x35 fractions raditation 2008;  no recurrence per pt   Hyperlipidemia    Hypertension    Kidney cysts    Left ureteral calculus    Low back pain     Melanoma (HCC) 2001   face   Muscle spasm of back    Myocardial infarction (HCC) 2016   Pneumonia    as a child   PONV (postoperative nausea and vomiting)    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   Squamous cell carcinoma of skin 02/22/2021   in istu- right forehead ([id number])   Takotsubo syndrome 12/29/2014   cardiologist---  dr Estrella Myrtle--- dx NSTEMI -- stress-induced w/ DCM--  per cath 12-30-2014 normal coronaries , ef 30-35%,  recovered ef per cardiac MRI  ef 64%   Urgency of urination    Wears glasses    Past Surgical History:  Procedure Laterality Date   BACK SURGERY     BREAST BIOPSY Bilateral  early 2000's   CARDIAC CATHETERIZATION  2006 (APPROX)  [place]   NORMAL   CARDIAC CATHETERIZATION  12/30/2014   CARDIAC CATHETERIZATION N/A 12/30/2014   Procedure: Left Heart Cath and Coronary Angiography;  Surgeon: Iran Ouch, MD;  Location: [facility];  Service: Cardiovascular;  Laterality: N/A;   CARDIOVASCULAR STRESS TEST  03-21-2012  DR Eden Emms   NORMAL NUCLEAR STUDY/  NO ISCHEMIA/  EF 63%   COLONOSCOPY  10/2020   COLONOSCOPY WITH PROPOFOL  05/29/2017   CYSTOSCOPY W/ URETERAL STENT PLACEMENT Left 03/26/2013   Procedure: CYSTOSCOPY WITH RETROGRADE PYELOGRAM ;  Surgeon: Milford Cage, MD;  Location: [facility];  Service: Urology;  Laterality: Left;   CYSTOSCOPY WITH RETROGRADE PYELOGRAM, URETEROSCOPY AND STENT PLACEMENT Bilateral 03/19/2013   Procedure: CYSTOSCOPY WITH RETROGRADE PYELOGRAM, BILATERAL URETEROSCOPY AND STENT PLACEMENT LEFT URETER,BILATERAL STONE EXTRACTION , HOLMIUM LASER LEFT URETER;  Surgeon: Milford Cage, MD;  Location: [facility];  Service: Urology;  Laterality: Bilateral;   CYSTOSCOPY WITH STENT PLACEMENT Left 03/26/2013   Procedure: CYSTOSCOPY WITH STENT PLACEMENT;  Surgeon: Milford Cage, MD;  Location: [facility];  Service: Urology;  Laterality: Left;   CYSTOSCOPY/URETEROSCOPY/HOLMIUM  LASER/STENT PLACEMENT Left 07/07/2020   Procedure: CYSTOSCOPY LEFT URETEROSCOPY/HOLMIUM LASER/STENT PLACEMENT;  Surgeon: Crista Elliot, MD;  Location: [facility];  Service: Urology;  Laterality: Left;   DIAGNOSTIC LAPAROSCOPY  04/12/2009   ESOPHAGOGASTRODUODENOSCOPY (EGD) WITH PROPOFOL N/A 02/10/2016   Procedure: ESOPHAGOGASTRODUODENOSCOPY (EGD) WITH PROPOFOL;  Surgeon: Bernette Redbird, MD;  Location: [facility];  Service: Endoscopy;  Laterality: N/A;   KIDNEY SURGERY  1966   BILATERAL URETER'S DILATATION   LUMBAR LAMINECTOMY/DECOMPRESSION MICRODISCECTOMY  05/18/2011   Procedure: LUMBAR LAMINECTOMY/DECOMPRESSION MICRODISCECTOMY;  Surgeon: Javier Docker;  Location: [facility];  Service: Orthopedics;  Laterality: Right;  Decompression Lumbar 4-Lumbar 5  Right    (xray)    LUMBAR LAMINECTOMY/DECOMPRESSION MICRODISCECTOMY N/A 05/06/2021   Procedure: Microlumbar decompression L5-S1;  Surgeon: Jene Every, MD;  Location: [facility];  Service: Orthopedics;  Laterality: N/A;  3 C-bed 90 mins   MELANOMA EXCISION WITH SENTINEL LYMPH NODE BIOPSY  2001   moh's procedure/  RIGHT FOREHEAD AND UPPER EYEBROW   RIGHT LATERAL PAROTIDECTOMY W/ NERVE DISSECTION / RIGHT MODIFIED RADICAL NECK DISSECTION SPARING SCM ELEVENTH NERVE AND INTERNAL JUGULAR VEIN  09-12-2006  DR DWIGHT BATES   DR DWIGHT BATES; "inside gland; lots of lymph nodes"   Family History  Problem Relation Age of Onset   Hypertension Mother    COPD Mother    Breast cancer Mother        breast   Dementia Mother    Heart disease Father    Cancer Father        Colorectal   Hyperlipidemia Father    Hypertension Father    Colon cancer Sister    Social History   Socioeconomic History   Marital status: Married    Spouse name: Not on file   Number of children: Not on file   Years of education: Not on file   Highest education level: Not on file  Occupational History   Occupation: Conservation officer, nature at [employer]  Tobacco Use   Smoking  status: Never   Smokeless tobacco: Never  Vaping Use   Vaping status: Never Used  Substance and Sexual Activity   Alcohol use: Not Currently    Comment: occasional- 1 drink per month   Drug use: Never   Sexual activity: Not on file  Other Topics Concern   Not on file  Social History Narrative   Right handed   Caffeine 3 cups daily   Lives one story home with basement 1 time weekly   Lives with husband and a dog   Social Drivers of Corporate investment banker Strain: Not on file  Food Insecurity: Not on file  Transportation Needs: Not on file  Physical Activity: Not on file  Stress: Not on file  Social Connections: Not on file  Intimate Partner Violence: Not on file    SUBJECTIVE  Review of Systems Constitutional: Patient denies any unintentional weight loss or change in strength lntegumentary: Patient denies any rashes or pruritus Cardiovascular: Patient denies chest pain or syncope Respiratory: Patient denies shortness of breath Gastrointestinal: Patient reports chronic constipation due to IBS; takes laxatives and/or stool softeners as needed Musculoskeletal: Patient denies muscle cramps or weakness Neurologic: Patient denies convulsions or seizures Allergic/Immunologic: Patient denies recent allergic reaction(s) Hematologic/Lymphatic: Patient denies bleeding tendencies Endocrine: Patient denies heat/cold intolerance  GU: As per HPI.  OBJECTIVE Vitals:   08/27/23 1003  BP: 136/84  Pulse: 81   There is no height or weight on file to calculate BMI.  Physical Examination Constitutional: No obvious distress; patient is non-toxic appearing  Cardiovascular: No visible lower extremity edema.  Respiratory: The patient does not have audible wheezing/stridor; respirations do not appear labored  Gastrointestinal: Abdomen non-distended Musculoskeletal: Normal ROM of UEs  Skin: No obvious rashes/open sores  Neurologic: CN 2-12 grossly intact Psychiatric: Answered  questions appropriately with normal affect  Hematologic/Lymphatic/Immunologic: No obvious bruises or sites of spontaneous bleeding  Urine microscopy: 6-10 WBC/hpf, 3-10 RBC/hpf, no bacteria PVR: 0 ml  ASSESSMENT Interstitial cystitis - Plan: Urinalysis, Routine w reflex microscopic, BLADDER SCAN AMB NON-IMAGING, Urine Culture  Chronic pelvic pain in female  Recurrent nephrolithiasis  Dysuria - Plan: Urine Culture, estradiol (ESTRACE) 0.1 MG/GM vaginal cream  Abnormal urinalysis - Plan: Urine Culture  Vaginal atrophy - Plan: estradiol (ESTRACE) 0.1 MG/GM vaginal cream  Persistent microscopic hematuria  Chronic constipation  1. Interstitial cystitis with dysuria, frequency, and urgency. Symptoms manageable overall per patient, however question possible UTI and/or vaginal atrophy due to dysuria without bladder pain, persistent microscopic hematuria, and abnormal UA today. Will check urine culture and treat as indicated based on results. Advised to start topical vaginal estrogen cream; rationale and potential risks/benefits were discussed in detail.   We discussed the overlap of acute UTI symptoms versus symptoms of IC flare ups. Patient was advised that if/when UTI-like symptoms occur they can call our office to request urine testing. We discussed possible contributing factors for IC flare ups along with management options including: - Urinary analgesics (Pyridium or Uribel). We discussed importance of limiting Pyridum use to 3 days consecutively due to risk for methemoglobinemia and damage to bone health. Antihistamine use Bladder instillations   2. Chronic pelvic pain. Secondary to #1; possibly exacerbated at times by #4. Manageable at this time per patient.  3. Kidney stones. Asymptomatic; no stones on recent CT.   4. IBS with chronic constipation. Currently exacerbated by opioids for knee pain. Discussed risk for voiding dysfunction secondary to constipation; will proceed with  ongoing constipation management with laxatives and/or stool softeners PRN and follow with her GI provider as advised.  We agreed to plan for follow up in 3 months or sooner if needed. Patient verbalized understanding of and agreement with current plan. All questions were answered.  PLAN Advised the following: Urine culture. Start topical  vaginal estrogen cream use as prescribed. 3. Return in about 3 months (around 11/27/2023) for UA, PVR, & f/u with Evette Georges NP.  Orders Placed This Encounter  Procedures   Microscopic Examination   Urine Culture   Urinalysis, Routine w reflex microscopic   BLADDER SCAN AMB NON-IMAGING   Total time spent caring for the patient today was over 45 minutes. This includes time spent on the date of the visit reviewing the patient's chart before the visit, time spent during the visit, and time spent after the visit on documentation. Over 50% of that time was spent in face-to-face time with this patient for direct counseling. E&M based on time and complexity of medical decision making.  It has been explained that the patient is to follow regularly with their PCP in addition to all other providers involved in their care and to follow instructions provided by these respective offices. Patient advised to contact urology clinic if any urologic-pertaining questions, concerns, new symptoms or problems arise in the interim period.  Patient Instructions  American Urological Association (AUA) -  Diagnosis and Treatment of Interstitial Cystitis/Bladder Pain Syndrome (2022): [email or web address] pain-syndrome-(2022)  The exact cause of Interstitial Cystitis (IC) (aka Bladder Pain Syndrome) is unknown, however theories include: Dysfunction of inner bladder lining known as the glycosaminoglycan (GAG) layer. Intrinsic abnormality of the urine. Histamine-related bladder inflammation/  irritation. Neuropathic pain/ nerve up-regulation, which results in inappropriately elevated pain signaling from the nerves in/around bladder to the brain. IC is not related to any infectious process or autoimmune conditions.  Commonly related problems for patients with IC: High-tone pelvic floor dysfunction (pelvic floor muscle tightness). Can cause pelvic pain, difficulty urinating, difficulty having bowel movements, pain with intercourse. Neuropathic pain / Nerve up-regulation. Results in inappropriately elevated pain signaling from the nerves in/around bladder to the brain. Several commonly co-occurring conditions include IBS, fibromyalgia, CFIDS, migraines, TMJ syndrome, and seasonal environmental allergies. IC flare ups can mimic the symptoms of UTI with no true bacterial infection being present. Any acute exacerbation of symptoms warrants evaluation with a urine culture.  Common triggers for IC flare ups: Certain beverages and foods which irritate the bladder This can be very patient specific See IC diet for list of common dietary irritants Physical stress Emotional / psychological stress Activities that put pressure on the pelvic area/ perineum (such as sexual intercourse, long car rides, etc) Seasonal environmental allergens  IC treatment options (for daily/ routine use): 1. Patient efforts to minimize life stressors: a. Educate employer/ support people about condition and related care needs b. Practice self care b. May benefit from mental health treatment with primary care provider or psychiatrist/ psychologist / therapist 2. For dysfunction of inner bladder lining (glycosaminoglycan (GAG) layer): a. Prescription medications: - Oral medication: Pentosan polysulfate (Elmiron) > May be cost prohibitive; not covered well by some insurance providers. Can be ordered via a compounding pharmacy as an alternative. > Requires 6 months of daily use (3 times per day) to achieve full  efficacy and to assess symptomatic response. > Requires routine annual eye exam. b. Medications instilled directly into the bladder through a catheter: - Pentosan polysulfate (Elmiron) - Heparin 3. For intrinsic abnormality of the urine: Identification and avoidance of dietary bladder irritants (commonly includes caffeine, alcohol, citric acid, spicy foods, acidic foods). For histamine-related bladder inflammation/ irritation: Antihistamine medication(s): Minimizes the uptake of histamine molecules Over-the-counter medications: Zyrtec (Cetirizine) and Xyzal (Levocetirizine) are preferred Prescription medications: Hydroxyzine (Vistaril / Atarax) Mast cell stabilizer medication:  Montelukast (Singulair): Inhibits  leukotriene receptors present in the bladder, thus preventing the activation of mast cells (which release pro-inflammatory mediators) For neuropathic pain/ nerve up-regulation, which results in inappropriately elevated pain signaling from the nerves in/around bladder to the brain: Prescription medications:  Such as: Amitriptyline (Elavil), Nortriptyline (Pamelor), Trazodone, Cymbalta (Duloxetine), Gabapentin (Neurontin), Lyrica (Pregabalin)  IC treatment options for flare ups (for on-demand use): Urinary analgesics for burning / painful urination Over-the-counter medications: Phenazopyradine (Pyridium). Use should be limited to no more than 3 days consecutively due to risk for methemoglobinemia and damage to liver & bone health. Prescription medications: Uribel, Urogesic blue, Uro-MP, Urelle, etc. Can be take every 6 hours as needed May be cost prohibitive; not covered well by some insurance providers Bladder instillations: Placement of a numbing medication (such as Lidocaine + sodium bicarbonate or Bupivacaine) through a catheter into the bladder. Can be done in-office at a nurse visit or at home Note: These medications are currently in short supply nationwide and may be  difficult to obtain until the shortage improves. Can add on an over-the-counter antihistamine or TUMS (if not already taking)  Management options for high-tone pelvic floor dysfunction secondary to IC: Stretching, relaxation techniques (such as deep breathing/ meditation), yoga, avoidance of Kegels or other activities which strain or tighten pelvic floor muscles Pelvic floor physical therapy Oral muscle relaxer medications such as Baclofen (Lioresal), Methocarbamol (Robaxin), Tizanadine (Zanaflex), Cyclobenzaprine (Flexeril) Vaginal or rectal Diazepam (Valium) suppositories Trigger point injections Pelvic nerve blocks  Additional IC treatment options: Some patients find benefit from use of baking soda to reduce the acidity of the urine. Can dissolve half a teaspoon in a full glass of water and drink twice a day. Increased water intake can dilute urine concentration - may help by reducing urine acidity.  Supplements such as Bladder Renew, CystoProtek, Cysta-Q, Prelief, Freeze-dried aloe vera, Marshmallow root, etc. These have not been evaluated by the FDA. Bladder Renew, CystoProtek, Cysta-Q: Sometimes used as an alternative to Elmiron. Prelief is an over the counter medication available at most pharmacies. Prelief works to remove acid from highly acidic foods such as tomato sauce, orange juice, coffee, and wine. Prelief can be added directly to these foods and helps to reduce bladder pain and urinary urgency. Cystoscopy with hydrodistention  a. This is an outpatient surgical procedure done in the OR under anesthesia for diagnostic benefit and   potential therapeutic benefit. It helps clinicians rule out other conditions, assess/ treat pain triggers such as Hunner's ulcers in the bladder, and evaluate bladder capacity (size). There is approximately 60% chance of symptomatic improvement with hydrodistention; the response can be quite variable. Rare surgical interventions Suprapubic catheter  placement Bladder removal (cystectomy) with urinary diversion (such as ileal conduit, [place] pouch, or neobladder creation)     Common triggers for IC flare ups: Certain beverages and foods which irritate the bladder This can be very patient specific See IC diet for list of common dietary irritants Physical stress Emotional / psychological stress Activities that put pressure on the pelvic area I perineum (such as sexual intercourse, long car rides, etc) Seasonal environmental allergens Urinary tract infection (UTI). You can call the urology office [phone]) to request lab order for urine testing if desired. May require antibiotic therapy if deemed appropriate based on urine culture result. Post-infectious (post-UTI) bladder inflammation. Can take several days or weeks to resolve.  IC treatment options for flare ups (for on-demand use):  Urinary analgesics for burning / painful urination Over-the-counter medications:  Phenazopyradine (Pyridium). Commonly known under the  AZO brand name. Use should be limited to no more than 3 days consecutively due to risk for methemoglobinemia and damage to liver & bone health. Prescription medications: Uribel, Urogesic blue, Uro-MP, Urelle, etc. Can be take every 6 hours as needed May be cost prohibitive; not covered well by some insurance providers Antihistamine use to minimize histamine-mediated bladder inflammation / pain If taking Hydroxyzine (a prescription antihistamine), may temporarily increase dose at bedtime as directed by provider. Can add on an over-the-counter antihistamine (Zyrtec or Xyzal preferred). Bladder instillations: Placement of a numbing medication (such as Lidocaine + sodium bicarbonate or Bupivacaine) through a catheter into the bladder.  b. Can be done in-office at a nurse visit or at home.   c. Note: These medications are currently in short supply nationwide and may be difficult to obtain until the shortage  improves.  Other: If symptoms fail to improve in a timely fashion, could also consider cystoscopy with hydrodistention for potential diagnostic and therapeutic benefit.      INTERSTITIAL CYSTITIS DIET  Many people with Interstitial Cystitis find that simple changes in their diet can help to control IC symptoms and avoid IC flare-ups. Typically, avoiding foods high in acid and potassium, as well as beverages containing caffeine and alcohol, is a good idea. This helpful guide can help you make "IC-Smart" meal choices. Keep it handy for easy reference when dining out or when preparing meals at home.  FRUITS: Allowable - Blueberries, Melons (except cantaloupe), Minute Maid Acid-Reduced Orange Juice (this can be diluted using water or Prelief* to prevent a flare-up) and Pears.  Avoidable -All other Fruits and Juices.  VEGETABLES:  Allowable - Homegrown Tomatoes, Acorn, Alfalfa, Beet, Broccoli, Bok Choy, [address], [place], [place], [place], [place], [place], [place], [place], [place], Mustard [place] and [employer].  Avoidable - Store-bought Tomatoes, Onions, Tofu, Soybeans, Lima beans, Fava Beans, Asparagus, Beet Greens, [place], [place], [place], [place], [place], [place], Mushrooms, [place], [place], [place], [place], [employer] and Rhubarb.  MEATS/FISH: Allowable - Beef, Chicken, [place], [place], [address], [address], [place], [place], [place], [place], [place], [place], [place], [place], [place], [place] and Shrimp.  Avoidable - Chicken Liver, Corned Beef, Pickled Herring and Fresh Herring, Aged, [address], Cured, Processed or Smoked Meats/Fish, [place], [place], [address] [place], [place], [place], [place], [place], [place], [place] and meats that contain nitrates or nitrites.  CARBS/GRAINS:  Allowable - Pasta, Rice, Potatoes, White Bread, Bread made of Rice, Millet, Quinoa, Buckwheat, [place], Refined, Cooked or Ready to Eat Cereal.  Avoidable - Rye Bread, Sourdough Breads and Fresh Baked  Yeast Products  FATS:    DAIRY: Allowable - Butter, Margarine, Vegetable Oils, Almonds, Cashews and Pine Nuts. Avoidable - Any other Nuts, Mayonnaise, Salad Dressing. Allowable - Milk, American Cheese, [address], Cream Cheese, String Cheese, [place], Ricotta Cheese, Frozen Yogurt and White Chocolate.   Avoidable - Yogurt, Goat's Milk, Chocolate Milk, Sour Cream, Aged Cheeses, Chocolate, [place], Camembert, Cheddar, [address], [place], [place], [place], [place], [place], [place], [place], [place] and [place].  SOUPS: Allowable - Soups made with allowed Vegetables, Cream of Broccoli, Water Cress Soup, Cream of Cauliflower Soup.  Avoidable -Any packaged Soups.   SEASONINGS:  Allowable - Garlic and other seasonings not listed below.  Avoid - Miso, Soy Sauce, Vinegar and any Spicy Foods (especially [place], [place], [place] and [place] foods)  BEVERAGES:  Allowable - Bottled or Spring Water, Decaffeinated, Acid-free or Low Acid Coffee or Tea, Alfalfa Leaf Tea, Sun Tea, Kava Instant Coffee, Peppermint Tea, Golden Seal Tea, Flat Soda and Low Acid Wines.  Avoid -Alcoholic  Beverages (except Low Acid Wines), Carbonated Drinks such as Soda, Coffee and Tea (except what is listed above), Fruit Juices and Chamomile Tea.  PRESERVATIVE: Avoid - Benzol Alcohol, Citric Acid, Monosodium Glutamate (MSG), Aspartame, Saccharin and foods containing Artificial Ingredients/Colors.        UTI prevention / management:  Difference between Urinalysis (urine dipstick test) and Urine culture:  Urinalysis (urine dipstick test): A quick office test used as an indicator to determine whether or not further testing is necessary (such as a urine culture, urine microscopy, etc.) The urinalysis cannot differentiate a true bacterial UTI or give a definitive diagnosis for the findings.  Urine culture: May be performed based on the findings of a urinalysis to evaluate for UTI. Grows out on a petri dish for  48-72 hours. Provides important information about: whether or not bacterial growth is present and if so: what the predominant bacteria is which antibiotics will work best against that bacteria That information is important so that we can diagnose and treat patients appropriately as there are other conditions which may mimic UTls which must not be missed (such as cancer, interstitial cystitis, stones, etc.). Assists [place] with antibiotic stewardship to minimize patient's risk for developing antibiotic resistance (getting to a point where no antibiotics work anymore).  Options when UTI symptoms occur: 1. Call [facility] [place] and request to speak with triage nurse (phone # [phone], select option 3). In accordance with clinic guidelines the nurse will determine next steps based on patient-reported symptoms, which may include: same-day lab visit to provide urine specimen, recommendation to schedule Urology office visit appointment for further evaluation, recommendation to proceed to ER, etc. 2. Call your Primary Care Provider (PCP) office to request urgent / same-day visit. Be sure to request for urine culture to be ordered and have results faxed to Urology (fax # [phone]).  3. Go to urgent care. Be sure to request for urine culture to be ordered and have results faxed to Urology (fax # [phone]).   For bladder pain/ burning with urination: - Can take over-the-counter Pyridium (phenazopyridine; commonly known under the "AZO" brand) for a few days as needed. Limit use to no more than 3 days consecutively due to risk for methemoglobinemia, liver function issues, and bone health damage with long term use of Pyridium. - Alternative: Prescription urinary analgesics (such as Uribel, Urogesic blue, Urelle, Uro-MP). Often expensive / poorly covered by insurance unfortunately.  Options / recommendations for UTI prevention: - Topical vaginal estrogen for vaginal atrophy (aka  Genitourinary Syndrome of Menopause (GSM)). - Adequate daily fluid intake to flush out the urinary tract. - Go to the bathroom to urinate every 4-6 hours while awake to minimize urinary stasis / bacterial overgrowth in the bladder. - Proanthocyanidin (PAC) supplement 36 mg daily; must be soluble (insoluble form of PAC will be ineffective). Recommended brand: Ellura. This is an over-the-counter supplement (often must be found/ purchased online) supplement derived from cranberries with concentrated active component: Proanthocyanidin (PAC) 36 mg daily. Decreases bacterial adherence to bladder lining.  - D-mannose powder (2 grams daily). This is an over-the-counter supplement which decreases bacterial adherence to bladder lining (it is a sugar that inhibits bacterial adherence to urothelial cells by binding to the pili of enteric bacteria). Take as per manufacturer recommendation. Can be used as an alternative or in addition to the concentrated cranberry supplement.  - Vitamin C supplement to acidify urine to minimize bacterial growth.  - Probiotic to maintain healthy vaginal microbiome to suppress bacteria at  urethral opening. Brand recommendations: Darrold Junker (includes probiotic & D-mannose ), Feminine Balance (highest concentration of lactobacillus) or Hyperbiotic Pro 15.  Note for patients with diabetes:  - Be aware that D-mannose contains sugar.  Note for patients with interstitial cystitis (IC):  - Patients with IC should typically avoid cranberry/ PAC supplements and Vitamin C supplements due to their acidity, which may exacerbate IC-related bladder pain. - Symptoms of true bacterial UTI can overlap / mimic symptoms of an IC flare up. Antibiotic use is NOT indicated for IC flare ups. Urine culture needed prior to antibiotic treatment for IC patients. The goal is to minimize your risk for developing antibiotic-resistant bacteria.    Vaginal atrophy I Genitourinary syndrome of menopause (GSM):  What  it is: Changes in the vaginal environment (including the vulva and urethra) including: Thinning of the epithelium (skin/ mucosa surface) Can contribute to urinary urgency and frequency Can contribute to dryness, itching, irritation of the vulvar and vaginal tissue Can contribute to pain with intercourse Can contribute to physical changes of the labia, vulva, and vagina such as: Narrowing of the vaginal opening Decreased vaginal length Loss of labial architecture Labial adhesions Pale color of vulvovaginal tissue  Loss of pubic hair Allows bacteria to become adherent  Results in increased risk for urinary tract infection (UTI) due to bacterial overgrowth and migration up the urethra into the bladder Change in vaginal pH (acid/ base balance) Allows for alteration / disruption of the normal bacterial flora / microbiome Results in increased risk for urinary tract infection (UTI) due to bacterial overgrowth  Treatment options: Over-the-counter lubricants (see list below). Prescription vaginal estrogen replacement. Options: Topical vaginal estrogen cream Estrace, Premarin, or compounded estradiol cream/ gel We advise: Discard plastic applicator as that tends to use more medication than you need, which is not harmful but wastes / uses up the medication. Also the plastic applicator may cause discomfort. Insert blueberry size amount of medication via the tip of your finger inside vagina nightly for 1 week then 2-3 times per week (long term). Estring vaginal ring Exchanged every 3 months (either at home or in office by provider) Vagifem vaginal tablet Inserted nightly for 2 weeks then twice a week (long term) lntrarosa vaginal suppository Vaginal DHEA: converts to estrogen in vaginal tissue without systemic effect Inserted nightly (long term) Vaginal laser therapy (Mona Lisa touch) Performed in 3 treatments each 6 weeks apart (available in our [place] office). Can feel like a sunburn for  3-4 days after each treatment until new skin heals in. Usually not covered by insurance. Estimated cost is $1500 for all 3 sessions.  FYI regarding prescription vaginal estrogen treatment options: All topical vaginal estrogen replacement options are equivalent in terms of efficacy. Topical vaginal estrogen replacement will take about 3 months to be effective. OK to have sex with any of the topical vaginal estrogen replacement options. Topical vaginal estrogen replacement may sting/burn initially due to severe dryness, which will improve with ongoing treatment. There have been studies that evaluate use of low-dose intravaginal estrogen that show minimal systemic absorption which is negligible after 3 weeks. There have been no studies indicating increased risk of contributing to cancer development or recurrence.  Topical vaginal estrogen cream safe to use with breast cancer history [email or web address]  Topical vaginal estrogen cream safe to use with blood clot history [email or web address]   Lubricants and Moisturizers for Treating Genitourinary Syndrome of Menopause and Vulvovaginal Atrophy Treatment Comments I Available Products   Lubricants   Water-based Ingredients: Deionized water, glycerin,  propylene glycol; latex safe; rare irritation; dry out with extended sexual activity Astroglide, Good Clean Love, K-Y Jelly, Natural, Organic, Pink, Sliquid, Sylk, Yes    Oil Based Ingredients: avocado, olive, peanut, corn; latex safe; can be used with silicone products; staining; safe (unless peanut allergy); non-irritating Coconut oil, vegetable oil, vitamin E oil  Silicone-Based Ingredients: Silicone polymers; staining; typically nonirritating, long lasting;  waterproof; should not be used with silicone dilators, sexual toys, or gynecologic products Astroglide X, Oceanus Ultra Pure, Pink Silicone, Pjur Eros, Replens Silky Smooth, Silicone Premium JO, SKYN, Uberlube, [employer] Based Minimize harm to sperm motility; designed Astroglide TTC, Conceive Plus, Pre for couples trying to conceive Seed, Yes Baby  Fertility Friendly Minimize harm to sperm motility; designed Astroglide, TTC, Conceive Plus, Pre for couples trying to conceive Seed, Yes Baby  Vaginal Moisturizers   Vaginal Moisturizers For maintenance use 1 to 3 times weekly; can benefit women with dryness, chafing with AOL, and recurrent vaginal infections irrespective of sexual activity timing Balance Active Menopause Vaginal Moisturizing Lubricant, Canesintima Intimate Moisturizer, Replens, Rephresh, Sylk Natural Intimate Moisturizer, Yes Vaginal Moisturizer  Hybrids Properties of both water and silicone-based products (combination of a vaginal lubricant and moisturizer); Non-irritating; good option for women with allergies and sensitivities Lubrigyn, Luvena  Suppositories Hyaluronic acid to retain moisture Revaree  Vulvar Soothing Creams/Oils    Medicated CreamsP ain and burn relief; Ingredients: 4% Lidocaine, Aloe Vera gel Releveum ([place])  Non-Medicated Creams For anti-itch and moisture/maintenance; Ingredients: Coconut oil, Avocado oil, Shea Butter, Olive oil, Vitamin E Vajuvenate, Vmagic  Oils !For moisture/maintenance !Coconut oil, Vitamin E oil, Emu oil     Electronically signed by:  Donnita Falls, MSN, FNP-C, CUNP 08/27/2023 11:20 AM

## 2023-08-27 ENCOUNTER — Ambulatory Visit: Payer: BC Managed Care – PPO | Admitting: Urology

## 2023-08-27 ENCOUNTER — Encounter: Payer: Self-pay | Admitting: Urology

## 2023-08-27 VITALS — BP 136/84 | HR 81

## 2023-08-27 DIAGNOSIS — G43909 Migraine, unspecified, not intractable, without status migrainosus: Secondary | ICD-10-CM | POA: Insufficient documentation

## 2023-08-27 DIAGNOSIS — G8929 Other chronic pain: Secondary | ICD-10-CM

## 2023-08-27 DIAGNOSIS — R8290 Unspecified abnormal findings in urine: Secondary | ICD-10-CM | POA: Diagnosis not present

## 2023-08-27 DIAGNOSIS — N3010 Interstitial cystitis (chronic) without hematuria: Secondary | ICD-10-CM

## 2023-08-27 DIAGNOSIS — K5909 Other constipation: Secondary | ICD-10-CM

## 2023-08-27 DIAGNOSIS — N952 Postmenopausal atrophic vaginitis: Secondary | ICD-10-CM

## 2023-08-27 DIAGNOSIS — R102 Pelvic and perineal pain: Secondary | ICD-10-CM

## 2023-08-27 DIAGNOSIS — R3129 Other microscopic hematuria: Secondary | ICD-10-CM

## 2023-08-27 DIAGNOSIS — N200 Calculus of kidney: Secondary | ICD-10-CM

## 2023-08-27 DIAGNOSIS — Z87442 Personal history of urinary calculi: Secondary | ICD-10-CM

## 2023-08-27 DIAGNOSIS — R300 Dysuria: Secondary | ICD-10-CM | POA: Diagnosis not present

## 2023-08-27 LAB — URINALYSIS, ROUTINE W REFLEX MICROSCOPIC
Bilirubin, UA: NEGATIVE
Glucose, UA: NEGATIVE
Ketones, UA: NEGATIVE
Nitrite, UA: NEGATIVE
Protein,UA: NEGATIVE
Specific Gravity, UA: >1.03 — ABNORMAL HIGH (ref 1.005–1.030)
Urobilinogen, Ur: 0.2 mg/dL (ref 0.2–1.0)
pH, UA: 5.5 (ref 5.0–7.5)

## 2023-08-27 LAB — MICROSCOPIC EXAMINATION: Bacteria, UA: NONE SEEN

## 2023-08-27 MED ORDER — ESTRADIOL 0.1 MG/GM VA CREA: TOPICAL_CREAM | VAGINAL | 3 refills | Status: AC

## 2023-08-27 NOTE — Patient Instructions (Addendum)
 American Urological Association (AUA) -  Diagnosis and Treatment of Interstitial Cystitis/Bladder Pain Syndrome (2022): [email or web address] pain-syndrome-(2022)  The exact cause of Interstitial Cystitis (IC) (aka Bladder Pain Syndrome) is unknown, however theories include: Dysfunction of inner bladder lining known as the glycosaminoglycan (GAG) layer. Intrinsic abnormality of the urine. Histamine-related bladder inflammation/ irritation. Neuropathic pain/ nerve up-regulation, which results in inappropriately elevated pain signaling from the nerves in/around bladder to the brain. IC is not related to any infectious process or autoimmune conditions.  Commonly related problems for patients with IC: High-tone pelvic floor dysfunction (pelvic floor muscle tightness). Can cause pelvic pain, difficulty urinating, difficulty having bowel movements, pain with intercourse. Neuropathic pain / Nerve up-regulation. Results in inappropriately elevated pain signaling from the nerves in/around bladder to the brain. Several commonly co-occurring conditions include IBS, fibromyalgia, CFIDS, migraines, TMJ syndrome, and seasonal environmental allergies. IC flare ups can mimic the symptoms of UTI with no true bacterial infection being present. Any acute exacerbation of symptoms warrants evaluation with a urine culture.  Common triggers for IC flare ups: Certain beverages and foods which irritate the bladder This can be very patient specific See IC diet for list of common dietary irritants Physical stress Emotional / psychological stress Activities that put pressure on the pelvic area/ perineum (such as sexual intercourse, long car rides, etc) Seasonal environmental allergens  IC treatment options (for daily/ routine use): 1. Patient efforts to minimize life stressors: a. Educate employer/ support people about  condition and related care needs b. Practice self care b. May benefit from mental health treatment with primary care provider or psychiatrist/ psychologist / therapist 2. For dysfunction of inner bladder lining (glycosaminoglycan (GAG) layer): a. Prescription medications: - Oral medication: Pentosan polysulfate (Elmiron) > May be cost prohibitive; not covered well by some insurance providers. Can be ordered via a compounding pharmacy as an alternative. > Requires 6 months of daily use (3 times per day) to achieve full efficacy and to assess symptomatic response. > Requires routine annual eye exam. b. Medications instilled directly into the bladder through a catheter: - Pentosan polysulfate (Elmiron) - Heparin 3. For intrinsic abnormality of the urine: Identification and avoidance of dietary bladder irritants (commonly includes caffeine, alcohol, citric acid, spicy foods, acidic foods). For histamine-related bladder inflammation/ irritation: Antihistamine medication(s): Minimizes the uptake of histamine molecules Over-the-counter medications: Zyrtec (Cetirizine) and Xyzal (Levocetirizine) are preferred Prescription medications: Hydroxyzine (Vistaril / Atarax) Mast cell stabilizer medication:  Montelukast (Singulair): Inhibits leukotriene receptors present in the bladder, thus preventing the activation of mast cells (which release pro-inflammatory mediators) For neuropathic pain/ nerve up-regulation, which results in inappropriately elevated pain signaling from the nerves in/around bladder to the brain: Prescription medications:  Such as: Amitriptyline (Elavil), Nortriptyline (Pamelor), Trazodone, Cymbalta (Duloxetine), Gabapentin (Neurontin), Lyrica (Pregabalin)  IC treatment options for flare ups (for on-demand use): Urinary analgesics for burning / painful urination Over-the-counter medications: Phenazopyradine (Pyridium). Use should be limited to no more than 3 days consecutively due to  risk for methemoglobinemia and damage to liver & bone health. Prescription medications: Uribel, Urogesic blue, Uro-MP, Urelle, etc. Can be take every 6 hours as needed May be cost prohibitive; not covered well by some insurance providers Bladder instillations: Placement of a numbing medication (such as Lidocaine + sodium bicarbonate or Bupivacaine) through a catheter into the bladder. Can be done in-office at a nurse visit or at home Note: These medications are currently in short supply nationwide and may be difficult to obtain until the shortage improves. Can add on an  over-the-counter antihistamine or TUMS (if not already taking)  Management options for high-tone pelvic floor dysfunction secondary to IC: Stretching, relaxation techniques (such as deep breathing/ meditation), yoga, avoidance of Kegels or other activities which strain or tighten pelvic floor muscles Pelvic floor physical therapy Oral muscle relaxer medications such as Baclofen (Lioresal), Methocarbamol (Robaxin), Tizanadine (Zanaflex), Cyclobenzaprine (Flexeril) Vaginal or rectal Diazepam (Valium) suppositories Trigger point injections Pelvic nerve blocks  Additional IC treatment options: Some patients find benefit from use of baking soda to reduce the acidity of the urine. Can dissolve half a teaspoon in a full glass of water and drink twice a day. Increased water intake can dilute urine concentration - may help by reducing urine acidity.  Supplements such as Bladder Renew, CystoProtek, Cysta-Q, Prelief, Freeze-dried aloe vera, Marshmallow root, etc. These have not been evaluated by the FDA. Bladder Renew, CystoProtek, Cysta-Q: Sometimes used as an alternative to Elmiron. Prelief is an over the counter medication available at most pharmacies. Prelief works to remove acid from highly acidic foods such as tomato sauce, orange juice, coffee, and wine. Prelief can be added directly to these foods and helps to reduce bladder pain  and urinary urgency. Cystoscopy with hydrodistention  a. This is an outpatient surgical procedure done in the OR under anesthesia for diagnostic benefit and  potential therapeutic benefit. It helps clinicians rule out other conditions, assess/ treat pain triggers such as Hunner's ulcers in the bladder, and evaluate bladder capacity (size). There is approximately 60% chance of symptomatic improvement with hydrodistention; the response can be quite variable. Rare surgical interventions Suprapubic catheter placement Bladder removal (cystectomy) with urinary diversion (such as ileal conduit, [place] pouch, or neobladder creation)     Common triggers for IC flare ups: Certain beverages and foods which irritate the bladder This can be very patient specific See IC diet for list of common dietary irritants Physical stress Emotional / psychological stress Activities that put pressure on the pelvic area I perineum (such as sexual intercourse, long car rides, etc) Seasonal environmental allergens Urinary tract infection (UTI). You can call the urology office [phone]) to request lab order for urine testing if desired. May require antibiotic therapy if deemed appropriate based on urine culture result. Post-infectious (post-UTI) bladder inflammation. Can take several days or weeks to resolve.  IC treatment options for flare ups (for on-demand use):  Urinary analgesics for burning / painful urination Over-the-counter medications:  Phenazopyradine (Pyridium). Commonly known under the AZO brand name. Use should be limited to no more than 3 days consecutively due to risk for methemoglobinemia and damage to liver & bone health. Prescription medications: Uribel, Urogesic blue, Uro-MP, Urelle, etc. Can be take every 6 hours as needed May be cost prohibitive; not covered well by some insurance providers Antihistamine use to minimize histamine-mediated bladder inflammation / pain If taking  Hydroxyzine (a prescription antihistamine), may temporarily increase dose at bedtime as directed by provider. Can add on an over-the-counter antihistamine (Zyrtec or Xyzal preferred). Bladder instillations: Placement of a numbing medication (such as Lidocaine + sodium bicarbonate or Bupivacaine) through a catheter into the bladder.  b. Can be done in-office at a nurse visit or at home.  c. Note: These medications are currently in short supply nationwide and may be difficult to obtain until the shortage improves.  Other: If symptoms fail to improve in a timely fashion, could also consider cystoscopy with hydrodistention for potential diagnostic and therapeutic benefit.      INTERSTITIAL CYSTITIS DIET  Many people with Interstitial Cystitis  find that simple changes in their diet can help to control IC symptoms and avoid IC flare-ups. Typically, avoiding foods high in acid and potassium, as well as beverages containing caffeine and alcohol, is a good idea. This helpful guide can help you make "IC-Smart" meal choices. Keep it handy for easy reference when dining out or when preparing meals at home.  FRUITS: Allowable - Blueberries, Melons (except cantaloupe), Minute Maid Acid-Reduced Orange Juice (this can be diluted using water or Prelief* to prevent a flare-up) and Pears.  Avoidable -All other Fruits and Juices.  VEGETABLES:  Allowable - Homegrown Tomatoes, Acorn, Alfalfa, Beet, Broccoli, Bok Choy, [address], [place], [place], [place], [place], [place], [place], [place], [place], Mustard [place] and [employer].  Avoidable - Store-bought Tomatoes, Onions, Tofu, Soybeans, Lima beans, Fava Beans, Asparagus, Beet Greens, [place], [place], [place], [place], [place], [place], Mushrooms, [place], [place], [place], [place], [employer] and Rhubarb.  MEATS/FISH: Allowable - Beef, Chicken, [place], [place], [address], [address], [place], [place], [place], [place], [place], [place], [place], [place], [place], [place] and Shrimp.  Avoidable - Chicken Liver, Corned Beef, Pickled Herring and Fresh Herring, Aged, [address], Cured, Processed or Smoked Meats/Fish, [place], [place], [address] [place], [place], [place], [place], [place], [place], [place] and meats that contain nitrates or nitrites.  CARBS/GRAINS:  Allowable - Pasta, Rice, Potatoes, White Bread, Bread made of Rice, Millet, Quinoa, Buckwheat, [place], Refined, Cooked or Ready to Eat Cereal.  Avoidable - Rye Bread, Sourdough Breads and Fresh Baked Yeast Products  FATS:    DAIRY: Allowable - Butter, Margarine, Vegetable Oils, Almonds, Cashews and Pine Nuts. Avoidable - Any other Nuts, Mayonnaise, Salad Dressing. Allowable - Milk, American Cheese, [address], Cream Cheese, String Cheese, Sanders, Ricotta Cheese, Frozen Yogurt and White Chocolate.   Avoidable - Yogurt, Goat's Milk, Chocolate Milk, Sour Cream, Aged Cheeses, Chocolate, [place], Camembert, Cheddar, [address], [place], [place], [place], [place], [place], [place], [place], [place] and [place].  SOUPS: Allowable - Soups made with allowed Vegetables, Cream of Broccoli, Water Cress Soup, Cream of Cauliflower Soup.  Avoidable -Any packaged Soups.  SEASONINGS:  Allowable - Garlic and other seasonings not listed below.  Avoid - Miso, Soy Sauce, Vinegar and any Spicy Foods (especially [place], [place], [place] and [place] foods)  BEVERAGES:  Allowable - Bottled or Spring Water, Decaffeinated, Acid-free or Low Acid Coffee or Tea, Alfalfa Leaf Tea, Sun Tea, Kava Instant Coffee, Peppermint Tea, Golden Seal Tea, Flat Soda and Low Acid Wines.  Avoid -Alcoholic Beverages (except Low Acid Wines), Carbonated Drinks such as Soda, Coffee and Tea (except what is listed above), Fruit Juices and Chamomile Tea.  PRESERVATIVE: Avoid - Benzol Alcohol, Citric Acid, Monosodium Glutamate (MSG), Aspartame, Saccharin and foods containing Artificial Ingredients/Colors.        UTI  prevention / management:  Difference between Urinalysis (urine dipstick test) and Urine culture:  Urinalysis (urine dipstick test): A quick office test used as an indicator to determine whether or not further testing is necessary (such as a urine culture, urine microscopy, etc.) The urinalysis cannot differentiate a true bacterial UTI or give a definitive diagnosis for the findings.  Urine culture: May be performed based on the findings of a urinalysis to evaluate for UTI. Grows out on a petri dish for 48-72 hours. Provides important information about: whether or not bacterial growth is present and if so: what the predominant bacteria is which antibiotics will work best against that bacteria That information is important so that we can diagnose and treat patients appropriately as there are other conditions which may mimic  UTls which must not be missed (such as cancer, interstitial cystitis, stones, etc.). Assists [place] with antibiotic stewardship to minimize patient's risk for developing antibiotic resistance (getting to a point where no antibiotics work anymore).  Options when UTI symptoms occur: 1. Call [facility] [place] and request to speak with triage nurse (phone # [phone], select option 3). In accordance with clinic guidelines the nurse will determine next steps based on patient-reported symptoms, which may include: same-day lab visit to provide urine specimen, recommendation to schedule Urology office visit appointment for further evaluation, recommendation to proceed to ER, etc. 2. Call your Primary Care Provider (PCP) office to request urgent / same-day visit. Be sure to request for urine culture to be ordered and have results faxed to Urology (fax # [phone]).  3. Go to urgent care. Be sure to request for urine culture to be ordered and have results faxed to Urology (fax # [phone]).   For bladder pain/ burning with urination: - Can take over-the-counter  Pyridium (phenazopyridine; commonly known under the "AZO" brand) for a few days as needed. Limit use to no more than 3 days consecutively due to risk for methemoglobinemia, liver function issues, and bone health damage with long term use of Pyridium. - Alternative: Prescription urinary analgesics (such as Uribel, Urogesic blue, Urelle, Uro-MP). Often expensive / poorly covered by insurance unfortunately.  Options / recommendations for UTI prevention: - Topical vaginal estrogen for vaginal atrophy (aka Genitourinary Syndrome of Menopause (GSM)). - Adequate daily fluid intake to flush out the urinary tract. - Go to the bathroom to urinate every 4-6 hours while awake to minimize urinary stasis / bacterial overgrowth in the bladder. - Proanthocyanidin (PAC) supplement 36 mg daily; must be soluble (insoluble form of PAC will be ineffective). Recommended brand: Ellura. This is an over-the-counter supplement (often must be found/ purchased online) supplement derived from cranberries with concentrated active component: Proanthocyanidin (PAC) 36 mg daily. Decreases bacterial adherence to bladder lining.  - D-mannose powder (2 grams daily). This is an over-the-counter supplement which decreases bacterial adherence to bladder lining (it is a sugar that inhibits bacterial adherence to urothelial cells by binding to the pili of enteric bacteria). Take as per manufacturer recommendation. Can be used as an alternative or in addition to the concentrated cranberry supplement.  - Vitamin C supplement to acidify urine to minimize bacterial growth.  - Probiotic to maintain healthy vaginal microbiome to suppress bacteria at urethral opening. Brand recommendations: Darrold Junker (includes probiotic & D-mannose ), Feminine Balance (highest concentration of lactobacillus) or Hyperbiotic Pro 15.  Note for patients with diabetes:  - Be aware that D-mannose contains sugar.  Note for patients with interstitial cystitis (IC):  -  Patients with IC should typically avoid cranberry/ PAC supplements and Vitamin C supplements due to their acidity, which may exacerbate IC-related bladder pain. - Symptoms of true bacterial UTI can overlap / mimic symptoms of an IC flare up. Antibiotic use is NOT indicated for IC flare ups. Urine culture needed prior to antibiotic treatment for IC patients. The goal is to minimize your risk for developing antibiotic-resistant bacteria.    Vaginal atrophy I Genitourinary syndrome of menopause (GSM):  What it is: Changes in the vaginal environment (including the vulva and urethra) including: Thinning of the epithelium (skin/ mucosa surface) Can contribute to urinary urgency and frequency Can contribute to dryness, itching, irritation of the vulvar and vaginal tissue Can contribute to pain with intercourse Can contribute to physical changes of the labia, vulva, and vagina  such as: Narrowing of the vaginal opening Decreased vaginal length Loss of labial architecture Labial adhesions Pale color of vulvovaginal tissue Loss of pubic hair Allows bacteria to become adherent  Results in increased risk for urinary tract infection (UTI) due to bacterial overgrowth and migration up the urethra into the bladder Change in vaginal pH (acid/ base balance) Allows for alteration / disruption of the normal bacterial flora / microbiome Results in increased risk for urinary tract infection (UTI) due to bacterial overgrowth  Treatment options: Over-the-counter lubricants (see list below). Prescription vaginal estrogen replacement. Options: Topical vaginal estrogen cream Estrace, Premarin, or compounded estradiol cream/ gel We advise: Discard plastic applicator as that tends to use more medication than you need, which is not harmful but wastes / uses up the medication. Also the plastic applicator may cause discomfort. Insert blueberry size amount of medication via the tip of your finger inside vagina  nightly for 1 week then 2-3 times per week (long term). Estring vaginal ring Exchanged every 3 months (either at home or in office by provider) Vagifem vaginal tablet Inserted nightly for 2 weeks then twice a week (long term) lntrarosa vaginal suppository Vaginal DHEA: converts to estrogen in vaginal tissue without systemic effect Inserted nightly (long term) Vaginal laser therapy (Mona Lisa touch) Performed in 3 treatments each 6 weeks apart (available in our [place] office). Can feel like a sunburn for 3-4 days after each treatment until new skin heals in. Usually not covered by insurance. Estimated cost is $1500 for all 3 sessions.  FYI regarding prescription vaginal estrogen treatment options: All topical vaginal estrogen replacement options are equivalent in terms of efficacy. Topical vaginal estrogen replacement will take about 3 months to be effective. OK to have sex with any of the topical vaginal estrogen replacement options. Topical vaginal estrogen replacement may sting/burn initially due to severe dryness, which will improve with ongoing treatment. There have been studies that evaluate use of low-dose intravaginal estrogen that show minimal systemic absorption which is negligible after 3 weeks. There have been no studies indicating increased risk of contributing to cancer development or recurrence.  Topical vaginal estrogen cream safe to use with breast cancer history [email or web address]  Topical vaginal estrogen cream safe to use with blood clot history [email or web address]   Lubricants and Moisturizers for Treating Genitourinary Syndrome of Menopause and Vulvovaginal Atrophy Treatment Comments I Available Products   Lubricants    Water-based Ingredients: Deionized water, glycerin, propylene glycol; latex safe; rare irritation; dry out with extended sexual activity Astroglide, Good Clean Love, K-Y Jelly, Natural, Organic, Pink, Sliquid, Sylk, Yes    Oil Based Ingredients: avocado, olive, peanut, corn; latex safe; can be used with silicone products; staining; safe (unless peanut allergy); non-irritating Coconut oil, vegetable oil, vitamin E oil  Silicone-Based Ingredients: Silicone polymers; staining; typically nonirritating, long lasting; waterproof; should not be used with silicone dilators, sexual toys, or gynecologic products Astroglide X, Oceanus Ultra Pure, Pink Silicone, Pjur Eros, Replens Silky Smooth, Silicone Premium JO, SKYN, Uberlube, [employer] Based Minimize harm to sperm motility; designed Astroglide TTC, Conceive Plus, Pre for couples trying to conceive Seed, Yes Baby  Fertility Friendly Minimize harm to sperm motility; designed Astroglide, TTC, Conceive Plus, Pre for couples trying to conceive Seed, Yes Baby  Vaginal Moisturizers   Vaginal Moisturizers For maintenance use 1 to 3 times weekly; can benefit women with dryness, chafing with AOL, and recurrent vaginal infections irrespective of sexual activity timing Balance Active Menopause Vaginal Moisturizing Lubricant, Canesintima Intimate Moisturizer, Replens,  Rephresh, Sylk Natural Intimate Moisturizer, Yes Vaginal Moisturizer  Hybrids Properties of both water and silicone-based products (combination of a vaginal lubricant and moisturizer); Non-irritating; good option for women with allergies and sensitivities Lubrigyn, Luvena  Suppositories Hyaluronic acid to retain moisture Revaree  Vulvar Soothing Creams/Oils    Medicated CreamsP ain and burn relief; Ingredients: 4% Lidocaine, Aloe Vera gel Releveum ([place])  Non-Medicated Creams For anti-itch and moisture/maintenance; Ingredients: Coconut oil, Avocado oil, Shea Butter,  Olive oil, Vitamin E Vajuvenate, Vmagic  Oils !For moisture/maintenance !Coconut oil, Vitamin E oil, Emu oil

## 2023-08-27 NOTE — Progress Notes (Signed)
 Bladder Scan completed today.  Patient can void prior to the bladder scan. Bladder scan result: 0  Performed By: Gwendolyn Grant T. CMA  Additional notes-

## 2023-08-29 ENCOUNTER — Telehealth: Payer: Self-pay

## 2023-08-29 LAB — URINE CULTURE: Organism ID, Bacteria: NO GROWTH

## 2023-08-29 NOTE — Telephone Encounter (Signed)
 Dr. Louanne Skye,  Do you have a Surgical Clearance form? Pt has not had an appt to discuss knee surgery. She has had recent labs, EKG and Physical in our office with Lendon Colonel this year.

## 2023-08-29 NOTE — Telephone Encounter (Signed)
 Surgical Clearance form printed from Epic and given to Dr. Louanne Skye for review.

## 2023-08-29 NOTE — Telephone Encounter (Signed)
 I do not have this surgical clearance form once we have it in hand I can look back to the labs and EKG and see when they were and we can assess whether she needs a separate appointment

## 2023-08-29 NOTE — Telephone Encounter (Signed)
 Copied from CRM [id number]. Topic: Clinical - Medical Advice >> Aug 29, 2023  8:47 AM Clayton Bibles wrote: Reason for CRM: Perle is calling to see when clearance for knee surgery will be completed and sent back to Dr. Shelle Iron. Please call Clarrissa at [phone] to let her know. Thanks

## 2023-09-04 ENCOUNTER — Telehealth (HOSPITAL_COMMUNITY): Payer: Self-pay | Admitting: *Deleted

## 2023-09-04 NOTE — Telephone Encounter (Signed)
 Reaching out to patient to offer assistance regarding upcoming cardiac imaging study; pt verbalizes understanding of appt date/time, parking situation and where to check in, pre-test NPO status and medications ordered, and verified current allergies; name and call back number provided for further questions should they arise Johney Frame RN Navigator Cardiac Imaging [facility] [phone] office [phone] cell

## 2023-09-05 ENCOUNTER — Ambulatory Visit (HOSPITAL_COMMUNITY)
Admission: RE | Admit: 2023-09-05 | Discharge: 2023-09-05 | Disposition: A | Discharge status: Home / Self Care | Source: Ambulatory Visit | Attending: Cardiovascular Disease | Admitting: Cardiovascular Disease

## 2023-09-05 DIAGNOSIS — R079 Chest pain, unspecified: Secondary | ICD-10-CM | POA: Diagnosis not present

## 2023-09-05 DIAGNOSIS — E782 Mixed hyperlipidemia: Secondary | ICD-10-CM | POA: Diagnosis not present

## 2023-09-05 DIAGNOSIS — Z01818 Encounter for other preprocedural examination: Secondary | ICD-10-CM | POA: Insufficient documentation

## 2023-09-05 DIAGNOSIS — I10 Essential (primary) hypertension: Secondary | ICD-10-CM | POA: Insufficient documentation

## 2023-09-05 MED ORDER — NITROGLYCERIN 0.4 MG SL SUBL
SUBLINGUAL_TABLET | SUBLINGUAL | Status: AC
Start: 2023-09-05 — End: ?
  Filled 2023-09-05: qty 2

## 2023-09-05 MED ORDER — IOHEXOL 350 MG/ML SOLN
95.0000 mL | Freq: Once | INTRAVENOUS | Status: AC | PRN
  Administered 2023-09-05: 95 mL via INTRAVENOUS

## 2023-09-05 MED ORDER — NITROGLYCERIN 0.4 MG SL SUBL
0.8000 mg | SUBLINGUAL_TABLET | Freq: Once | SUBLINGUAL | Status: AC
  Administered 2023-09-05: 0.8 mg via SUBLINGUAL

## 2023-09-05 NOTE — Progress Notes (Signed)
 Patient tolerated CT well. Vital signs stable encourage to drink water throughout day.Reasons explained and verbalized understanding. Ambulated steady gait.

## 2023-09-07 NOTE — Telephone Encounter (Signed)
   Patient Name: [name]  DOB: Apr 20, 1961 MRN: [id number]  Primary Cardiologist: Charlton Haws, MD  Chart reviewed as part of pre-operative protocol coverage. Given past medical history and time since last visit, based on ACC/AHA guidelines, [name] is at acceptable risk for the planned procedure without further cardiovascular testing.    Patient was seen by Dr. Eden Emms on 08/16/2023.  Coronary CTA revealed coronary calcium score of 0.  She is pending echocardiogram, per Dr. Eden Emms, "She's ok to have surgery before TTE."   I will route this recommendation to the requesting party via Epic fax function and remove from pre-op pool.  Please call with questions.  Joylene Grapes, NP 09/07/2023, 2:27 PM

## 2023-09-10 NOTE — Telephone Encounter (Signed)
 Pt called saying her doctor from [facility] needs the clearance papers back so they can schedule the knee surgery.   Quincy Carnes faxed another form today.

## 2023-09-11 ENCOUNTER — Telehealth: Payer: Self-pay | Admitting: Family Medicine

## 2023-09-12 NOTE — Telephone Encounter (Signed)
 Completed form

## 2023-09-13 ENCOUNTER — Ambulatory Visit (HOSPITAL_COMMUNITY): Payer: BC Managed Care – PPO | Attending: Cardiology

## 2023-09-13 DIAGNOSIS — R079 Chest pain, unspecified: Secondary | ICD-10-CM | POA: Diagnosis present

## 2023-09-13 DIAGNOSIS — E782 Mixed hyperlipidemia: Secondary | ICD-10-CM | POA: Diagnosis present

## 2023-09-13 DIAGNOSIS — I420 Dilated cardiomyopathy: Secondary | ICD-10-CM

## 2023-09-13 DIAGNOSIS — Z01818 Encounter for other preprocedural examination: Secondary | ICD-10-CM

## 2023-09-13 DIAGNOSIS — I10 Essential (primary) hypertension: Secondary | ICD-10-CM

## 2023-09-13 LAB — ECHOCARDIOGRAM COMPLETE
Area-P 1/2: 3.72 cm2
S' Lateral: 2.6 cm

## 2023-09-14 ENCOUNTER — Ambulatory Visit: Payer: Self-pay | Admitting: Orthopedic Surgery

## 2023-09-18 ENCOUNTER — Telehealth: Payer: Self-pay | Admitting: Cardiovascular Disease

## 2023-09-18 NOTE — Telephone Encounter (Signed)
*  STAT* If patient is at the pharmacy, call can be transferred to refill team.   1. Which medications need to be refilled? (please list name of each medication and dose if known) carvedilol (COREG) 3.125 MG tablet   lisinopril (ZESTRIL) 2.5 MG tablet   2. Which pharmacy/location (including street and city if local pharmacy) is medication to be sent to? [facility] [address], [place] - [address]   3. Do they need a 30 day or 90 day supply? 90

## 2023-09-19 MED ORDER — CARVEDILOL 3.125 MG PO TABS: 3.1250 mg | ORAL_TABLET | Freq: Two times a day (BID) | ORAL | 3 refills | Status: AC

## 2023-09-19 MED ORDER — LISINOPRIL 2.5 MG PO TABS: 2.5000 mg | ORAL_TABLET | Freq: Every morning | ORAL | 3 refills | Status: AC

## 2023-09-19 NOTE — Telephone Encounter (Signed)
 Refill for Carvedilol and Lisinopril has been sent to [facility], per pt's request.

## 2023-09-20 NOTE — Addendum Note (Signed)
 Addended by: Virl Axe, Birtie Fellman L on: 09/20/2023 04:29 PM   Modules accepted: Orders

## 2023-09-25 NOTE — Progress Notes (Signed)
 COVID Vaccine Completed: yes  Date of COVID positive in last 90 days:  PCP - Arville Care, MD Cardiologist - Charlton Haws, MD  Cardiac clearance by Bernadene Person, NP 09/07/23 in Epic   Chest x-ray - 08/01/23 Epic EKG - 08/02/23 Epic Stress Test - 2013 ECHO - 09/13/23 Epic CT coronary- 09/05/23 Epic Cardiac Cath - 12/30/14 Epic Pacemaker/ICD device last checked: Spinal Cord Stimulator:  Bowel Prep -   Sleep Study -  CPAP -   Fasting Blood Sugar -  Checks Blood Sugar _____ times a day  Last dose of GLP1 agonist-  N/A GLP1 instructions:  Hold 7 days before surgery    Last dose of SGLT-2 inhibitors-  N/A SGLT-2 instructions:  Hold 3 days before surgery    Blood Thinner Instructions:  Last dose:   Time: Aspirin Instructions: Last Dose:  Activity level:  Can go up a flight of stairs and perform activities of daily living without stopping and without symptoms of chest pain or shortness of breath.  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 Patient was also instructed that they will need to review over the PAT instructions again at home before surgery.

## 2023-09-25 NOTE — Patient Instructions (Signed)
 SURGICAL WAITING ROOM VISITATION  Patients having surgery or a procedure may have no more than 2 support people in the waiting area - these visitors may rotate.    Children under the age of 81 must have an adult with them who is not the patient.  Due to an increase in RSV and influenza rates and associated hospitalizations, children ages 29 and under may not visit patients in [facility] hospitals.  Visitors with respiratory illnesses are discouraged from visiting and should remain at home.  If the patient needs to stay at the hospital during part of their recovery, the visitor guidelines for inpatient rooms apply. Pre-op nurse will coordinate an appropriate time for 1 support person to accompany patient in pre-op.  This support person may not rotate.    Please refer to the [facility] website for the visitor guidelines for Inpatients (after your surgery is over and you are in a regular room).    Your procedure is scheduled on: 10/03/23   Report to [facility] Main Entrance    Report to admitting at 6:15 AM   Call this number if you have problems the morning of surgery [phone]   Do not eat food :After Midnight.   After Midnight you may have the following liquids until 5:30 AM DAY OF SURGERY  Water Non-Citrus Juices (without pulp, NO RED-Apple, White grape, White cranberry) Black Coffee (NO MILK/CREAM OR CREAMERS, sugar ok)  Clear Tea (NO MILK/CREAM OR CREAMERS, sugar ok) regular and decaf                             Plain Jell-O (NO RED)                                           Fruit ices (not with fruit pulp, NO RED)                                     Popsicles (NO RED)                                                               Sports drinks like Gatorade (NO RED)               The day of surgery:  Drink ONE (1) Pre-Surgery Clear Ensure at 5:30 AM the morning of surgery. Drink in one sitting. Do not sip.  This drink was given to you during your hospital   pre-op appointment visit. Nothing else to drink after completing the  Pre-Surgery Clear Ensure.          If you have questions, please contact your surgeon's office.   FOLLOW BOWEL PREP AND ANY ADDITIONAL PRE OP INSTRUCTIONS YOU RECEIVED FROM YOUR SURGEON'S OFFICE!!!     Oral Hygiene is also important to reduce your risk of infection.                                    Remember - BRUSH YOUR  TEETH THE MORNING OF SURGERY WITH YOUR REGULAR TOOTHPASTE  DENTURES WILL BE REMOVED PRIOR TO SURGERY PLEASE DO NOT APPLY "Poly grip" OR ADHESIVES!!!   Stop all vitamins and herbal supplements 7 days before surgery.   Take these medicines the morning of surgery with A SIP OF WATER: Carvedilol, Oxycodone, Famotidine                               You may not have any metal on your body including hair pins, jewelry, and body piercing             Do not wear make-up, lotions, powders, perfumes, or deodorant  Do not wear nail polish including gel and S&S, artificial/acrylic nails, or any other type of covering on natural nails including finger and toenails. If you have artificial nails, gel coating, etc. that needs to be removed by a nail salon please have this removed prior to surgery or surgery may need to be canceled/ delayed if the surgeon/ anesthesia feels like they are unable to be safely monitored.   Do not shave  48 hours prior to surgery.    Do not bring valuables to the hospital. [facility] IS NOT             RESPONSIBLE   FOR VALUABLES.   Contacts, glasses, dentures or bridgework may not be worn into surgery.  DO NOT BRING YOUR HOME MEDICATIONS TO THE HOSPITAL. PHARMACY WILL DISPENSE MEDICATIONS LISTED ON YOUR MEDICATION LIST TO YOU DURING YOUR ADMISSION IN THE HOSPITAL!    Patients discharged on the day of surgery will not be allowed to drive home.  Someone NEEDS to stay with you for the first 24 hours after anesthesia.              Please read over the following fact sheets you were  given: IF YOU HAVE QUESTIONS ABOUT YOUR PRE-OP INSTRUCTIONS PLEASE CALL [name]    If you received a COVID test during your pre-op visit  it is requested that you wear a mask when out in public, stay away from anyone that may not be feeling well and notify your surgeon if you develop symptoms. If you test positive for Covid or have been in contact with anyone that has tested positive in the last 10 days please notify you surgeon.     - Preparing for Surgery Before surgery, you can play an important role.  Because skin is not sterile, your skin needs to be as free of germs as possible.  You can reduce the number of germs on your skin by washing with CHG (chlorahexidine gluconate) soap before surgery.  CHG is an antiseptic cleaner which kills germs and bonds with the skin to continue killing germs even after washing. Please DO NOT use if you have an allergy to CHG or antibacterial soaps.  If your skin becomes reddened/irritated stop using the CHG and inform your nurse when you arrive at Short Stay. Do not shave (including legs and underarms) for at least 48 hours prior to the first CHG shower.  You may shave your face/neck.  Please follow these instructions carefully:  1.  Shower with CHG Soap the night before surgery and the  morning of surgery.  2.  If you choose to wash your hair, wash your hair first as usual with your normal  shampoo.  3.  After you shampoo, rinse your hair and body thoroughly to  remove the shampoo.                             4.  Use CHG as you would any other liquid soap.  You can apply chg directly to the skin and wash.  Gently with a scrungie or clean washcloth.  5.  Apply the CHG Soap to your body ONLY FROM THE NECK DOWN.   Do   not use on face/ open                           Wound or open sores. Avoid contact with eyes, ears mouth and   genitals (private parts).                       Wash face,  Genitals (private parts) with your normal soap.              6.  Wash thoroughly, paying special attention to the area where your    surgery  will be performed.  7.  Thoroughly rinse your body with warm water from the neck down.  8.  DO NOT shower/wash with your normal soap after using and rinsing off the CHG Soap.                9.  Pat yourself dry with a clean towel.            10.  Wear clean pajamas.            11.  Place clean sheets on your bed the night of your first shower and do not  sleep with pets. Day of Surgery : Do not apply any lotions/deodorants the morning of surgery.  Please wear clean clothes to the hospital/surgery center.  FAILURE TO FOLLOW THESE INSTRUCTIONS MAY RESULT IN THE CANCELLATION OF YOUR SURGERY  PATIENT SIGNATURE_________________________________  NURSE SIGNATURE__________________________________  ________________________________________________________________________  [name]  An incentive spirometer is a tool that can help keep your lungs clear and active. This tool measures how well you are filling your lungs with each breath. Taking long deep breaths may help reverse or decrease the chance of developing breathing (pulmonary) problems (especially infection) following: A long period of time when you are unable to move or be active. BEFORE THE PROCEDURE  If the spirometer includes an indicator to show your best effort, your nurse or respiratory therapist will set it to a desired goal. If possible, sit up straight or lean slightly forward. Try not to slouch. Hold the incentive spirometer in an upright position. INSTRUCTIONS FOR USE  Sit on the edge of your bed if possible, or sit up as far as you can in bed or on a chair. Hold the incentive spirometer in an upright position. Breathe out normally. Place the mouthpiece in your mouth and seal your lips tightly around it. Breathe in slowly and as deeply as possible, raising the piston or the ball toward the top of the column. Hold your breath for  3-5 seconds or for as long as possible. Allow the piston or ball to fall to the bottom of the column. Remove the mouthpiece from your mouth and breathe out normally. Rest for a few seconds and repeat Steps 1 through 7 at least 10 times every 1-2 hours when you are awake. Take your time and take a few normal breaths between deep breaths. The spirometer may include an  indicator to show your best effort. Use the indicator as a goal to work toward during each repetition. After each set of 10 deep breaths, practice coughing to be sure your lungs are clear. If you have an incision (the cut made at the time of surgery), support your incision when coughing by placing a pillow or rolled up towels firmly against it. Once you are able to get out of bed, walk around indoors and cough well. You may stop using the incentive spirometer when instructed by your caregiver.  RISKS AND COMPLICATIONS Take your time so you do not get dizzy or light-headed. If you are in pain, you may need to take or ask for pain medication before doing incentive spirometry. It is harder to take a deep breath if you are having pain. AFTER USE Rest and breathe slowly and easily. It can be helpful to keep track of a log of your progress. Your caregiver can provide you with a simple table to help with this. If you are using the spirometer at home, follow these instructions: SEEK MEDICAL CARE IF:  You are having difficultly using the spirometer. You have trouble using the spirometer as often as instructed. Your pain medication is not giving enough relief while using the spirometer. You develop fever of 100.5 F (38.1 C) or higher. SEEK IMMEDIATE MEDICAL CARE IF:  You cough up bloody sputum that had not been present before. You develop fever of 102 F (38.9 C) or greater. You develop worsening pain at or near the incision site. MAKE SURE YOU:  Understand these instructions. Will watch your condition. Will get help right away if you  are not doing well or get worse. Document Released: 10/16/2006 Document Revised: 08/28/2011 Document Reviewed: 12/17/2006 [facility] Patient Information 2014 [place], [place].   ________________________________________________________________________

## 2023-09-26 ENCOUNTER — Encounter (HOSPITAL_COMMUNITY)
Admission: RE | Admit: 2023-09-26 | Discharge: 2023-09-26 | Disposition: A | Discharge status: Home / Self Care | Source: Ambulatory Visit | Attending: Specialist | Admitting: Specialist

## 2023-09-26 ENCOUNTER — Other Ambulatory Visit: Payer: Self-pay

## 2023-09-26 ENCOUNTER — Encounter (HOSPITAL_COMMUNITY): Payer: Self-pay

## 2023-09-26 VITALS — BP 176/76 | HR 89 | Temp 98.0°F | Resp 16 | Ht 64.0 in | Wt 200.0 lb

## 2023-09-26 DIAGNOSIS — Z01812 Encounter for preprocedural laboratory examination: Secondary | ICD-10-CM | POA: Insufficient documentation

## 2023-09-26 DIAGNOSIS — I10 Essential (primary) hypertension: Secondary | ICD-10-CM | POA: Insufficient documentation

## 2023-09-26 DIAGNOSIS — I5181 Takotsubo syndrome: Secondary | ICD-10-CM | POA: Insufficient documentation

## 2023-09-26 LAB — BASIC METABOLIC PANEL WITH GFR
Anion gap: 7 (ref 5–15)
BUN: 11 mg/dL (ref 8–23)
CO2: 28 mmol/L (ref 22–32)
Calcium: 9.1 mg/dL (ref 8.9–10.3)
Chloride: 102 mmol/L (ref 98–111)
Creatinine, Ser: 0.92 mg/dL (ref 0.44–1.00)
GFR, Estimated: >60 mL/min (ref >60)
Glucose, Bld: 105 mg/dL — ABNORMAL HIGH (ref 70–99)
Potassium: 3.9 mmol/L (ref 3.5–5.1)
Sodium: 137 mmol/L (ref 135–145)

## 2023-09-26 LAB — CBC
HCT: 38.4 % (ref 36.0–46.0)
Hemoglobin: 12.7 g/dL (ref 12.0–15.0)
MCH: 32 pg (ref 26.0–34.0)
MCHC: 33.1 g/dL (ref 30.0–36.0)
MCV: 96.7 fL (ref 80.0–100.0)
Platelets: 175 10*3/uL (ref 150–400)
RBC: 3.97 MIL/uL (ref 3.87–5.11)
RDW: 12.3 % (ref 11.5–15.5)
WBC: 5.1 10*3/uL (ref 4.0–10.5)
nRBC: 0 % (ref 0.0–0.2)

## 2023-09-27 NOTE — Progress Notes (Signed)
 Anesthesia Chart Review:  63 year old female follows with cardiology for history of Takotsubo syndrome, abnormal ECG, labile BP, and chest pain. Admitted in 2016 with chest pain - found to have Tkatsubo with EF 45% - no CAD by cath - MRI in 02/2015 showed normal EF. Cardiac CT done 05/15/17 normal with calcium score 0.  Coronary CTA 09/05/2023 showed calcium score of 0, no evidence of CAD.  Echo 09/13/2023 showed EF 60 to 65%, grade 1 DD, normal RV function, no significant valvular abnormalities.  Other pertinent hx includes PONV, GERD, esophageal spasm, headaches, asthma.  Preop labs reviewed, unremarkable.   EKG 08/01/2023: Sinus tachycardia.  Rate 102.  Left axis deviation.  Low-voltage QRS.  Cannot rule out anterior infarct, age undetermined.  TTE 09/13/2023: 1. Left ventricular ejection fraction, by estimation, is 60 to 65%. Left  ventricular ejection fraction by 3D volume is 60 %. The left ventricle has  normal function. The left ventricle has no regional wall motion  abnormalities. Left ventricular diastolic   parameters are consistent with Grade I diastolic dysfunction (impaired  relaxation). The average left ventricular global longitudinal strain is  -26.8 %. The global longitudinal strain is normal.   2. Right ventricular systolic function is normal. The right ventricular  size is normal. Tricuspid regurgitation signal is inadequate for assessing  PA pressure.   3. The mitral valve is normal in structure. No evidence of mitral valve  regurgitation. No evidence of mitral stenosis.   4. The aortic valve is tricuspid. Aortic valve regurgitation is not  visualized. No aortic stenosis is present.   5. The inferior vena cava is normal in size with greater than 50%  respiratory variability, suggesting right atrial pressure of 3 mmHg.   Coronary CTA 09/05/2023: IMPRESSION: 1. Coronary calcium score of 0. This was 0 percentile for age and sex matched control.   2. Normal coronary origin  with right dominance.   3.CAD-RADS 0. No evidence of CAD (0%). Consider non-atherosclerotic causes of chest pain.   4. Total plaque volume is 0.     [facility]annie [facility] Short Stay Center/Anesthesiology Phone [phone] 09/27/2023 12:40 PM

## 2023-09-27 NOTE — Anesthesia Preprocedure Evaluation (Addendum)
 Anesthesia Evaluation    History of Anesthesia Complications (+) PONV and history of anesthetic complications  Airway Mallampati: II  TM Distance: >3 FB Neck ROM: Full    Dental no notable dental hx.    Pulmonary asthma    Pulmonary exam normal breath sounds clear to auscultation       Cardiovascular hypertension, Pt. on medications Normal cardiovascular exam Rhythm:Regular Rate:Normal     Neuro/Psych  Headaches  Anxiety Depression       GI/Hepatic ,GERD  ,,  Endo/Other    Renal/GU      Musculoskeletal  (+) Arthritis , Osteoarthritis,    Abdominal  (+) + obese  Peds  Hematology   Anesthesia Other Findings   Reproductive/Obstetrics                             Anesthesia Physical Anesthesia Plan  ASA: 2  Anesthesia Plan: General   Post-op Pain Management:    Induction: Intravenous  PONV Risk Score and Plan: 4 or greater and Ondansetron, Dexamethasone, Midazolam, Droperidol and Treatment may vary due to age or medical condition  Airway Management Planned: LMA  Additional Equipment:   Intra-op Plan:   Post-operative Plan: Extubation in OR  Informed Consent: I have reviewed the patients History and Physical, chart, labs and discussed the procedure including the risks, benefits and alternatives for the proposed anesthesia with the patient or authorized representative who has indicated his/her understanding and acceptance.     Dental advisory given  Plan Discussed with: CRNA  Anesthesia Plan Comments: (PAT note by Rudy Costain, PA-C: 63 year old female follows with cardiology for history of Takotsubo syndrome, abnormal ECG, labile BP, and chest pain. Admitted in 2016 with chest pain - found to have Tkatsubo with EF 45% - no CAD by cath - MRI in 02/2015 showed normal EF. Cardiac CT done 05/15/17 normal with calcium score 0.  Coronary CTA 09/05/2023 showed calcium score of 0, no  evidence of CAD.  Echo 09/13/2023 showed EF 60 to 65%, grade 1 DD, normal RV function, no significant valvular abnormalities.   Other pertinent hx includes PONV, GERD, esophageal spasm, headaches, asthma.   Preop labs reviewed, unremarkable.    EKG 08/01/2023: Sinus tachycardia.  Rate 102.  Left axis deviation.  Low-voltage QRS.  Cannot rule out anterior infarct, age undetermined.   TTE 09/13/2023: 1. Left ventricular ejection fraction, by estimation, is 60 to 65%. Left  ventricular ejection fraction by 3D volume is 60 %. The left ventricle has  normal function. The left ventricle has no regional wall motion  abnormalities. Left ventricular diastolic   parameters are consistent with Grade I diastolic dysfunction (impaired  relaxation). The average left ventricular global longitudinal strain is  -26.8 %. The global longitudinal strain is normal.   2. Right ventricular systolic function is normal. The right ventricular  size is normal. Tricuspid regurgitation signal is inadequate for assessing  PA pressure.   3. The mitral valve is normal in structure. No evidence of mitral valve  regurgitation. No evidence of mitral stenosis.   4. The aortic valve is tricuspid. Aortic valve regurgitation is not  visualized. No aortic stenosis is present.   5. The inferior vena cava is normal in size with greater than 50%  respiratory variability, suggesting right atrial pressure of 3 mmHg.    Coronary CTA 09/05/2023: IMPRESSION: 1. Coronary calcium score of 0. This was 0 percentile for age and sex matched control.  2. Normal coronary origin with right dominance.   3.CAD-RADS 0. No evidence of CAD (0%). Consider non-atherosclerotic causes of chest pain.   4. Total plaque volume is 0.     )        Anesthesia Quick Evaluation

## 2023-10-02 ENCOUNTER — Ambulatory Visit: Payer: Self-pay | Admitting: Orthopedic Surgery

## 2023-10-02 NOTE — H&P (Signed)
 [name] is an 63 y.o. female.   Chief Complaint: left knee pain HPI: Reason for Visit: (normal) review of test results (lumbar and left knee MRIs); 06/20/23 fall Severity: pain level 10/10 Quality: sharp; stabbing Associated Symptoms: instability left knee Medications: Oxycodone-out Lidocaine patches, Ibuprofen, Tylenol Notes: 07/02/23 AO-Meloxicam 07/12/23 [name]- cortisone injection, Percocet, Prednisone X 2 rounds Having more knee pain as opposed to radicular pain  Past Medical History:  Diagnosis Date   Anxiety    Arthritis    back (12/30/2014)   Asthma    as a child, no problems as an adult, has an albuterol inhaler   Cerebral cyst    per brain MRI 07-12-2019 unchanged 6mm cyst inferomedial right frontal lobe   Chronic headaches    Chronic systolic (congestive) heart failure (HCC)    followed by dr Estrella Myrtle   Complication of anesthesia    pt is very sensitive to benzodiazepines, pt stated "almost died"   Compression fracture of lumbar vertebra (HCC) 05/27/2020   per pt L3 and L4   DCM (dilated cardiomyopathy) (HCC)    w/ Takatsubo---  followed by dr Eden Emms---  stress-induced --- cardiac cath 12-30-2014 ef 30-35%, recovered per cardiac MRI 03-15-2015 ef  64%   Depression    Disorder of mastoid    per brain MRI 07-12-2019 persistant large mastoid effusion   Dysuria    Environmental and seasonal allergies    Frequency of urination    GERD (gastroesophageal reflux disease)    History of asthma    childhood   History of esophageal spasm    History of kidney stones    History of melanoma excision 2001   right supraorbital (right forehead and upper eyelid )  s/p  Moh's procedure w/ sln bx,  no recurrence per pt   History of non-ST elevation myocardial infarction (NSTEMI) 12/29/2014   per cath normal coronaries , ef 30-35%---  stress-- induced Takotsubo syndrome    History of palpitations 01/05/2015   STRESS INDUCED   History of parotid cancer 2008   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---  09-12-2006 s/p  right lateral parotidectomy w/ nerve dissection , modified radical neck dissection ;  completed  x35 fractions raditation 2008;  no recurrence per pt   Hyperlipidemia    Hypertension    Kidney cysts    Left ureteral calculus    Low back pain    Melanoma (HCC) 2001   face   Muscle spasm of back    Myocardial infarction (HCC) 2016   Pneumonia    as a child   PONV (postoperative nausea and vomiting)    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   Squamous cell carcinoma of skin 02/22/2021   in ist[place]- right forehead (CX35FU)   Takotsubo syndrome 12/29/2014   cardiologist---  dr Estrella Myrtle--- dx NSTEMI -- stress-induced w/ DCM--  per cath 12-30-2014 normal coronaries , ef 30-35%,  recovered ef per cardiac MRI  ef 64%   Urgency of urination    Wears glasses     Past Surgical History:  Procedure Laterality Date   BACK SURGERY     BREAST BIOPSY Bilateral early 2000's   CARDIAC CATHETERIZATION  2006 (APPROX)  [place]   NORMAL   CARDIAC CATHETERIZATION  12/30/2014   CARDIAC CATHETERIZATION N/A 12/30/2014   Procedure: Left Heart Cath and Coronary Angiography;  Surgeon: Iran Ouch, MD;  Location: [facility];  Service: Cardiovascular;  Laterality: N/A;  CARDIOVASCULAR STRESS TEST  03-21-2012  DR NISHAN   NORMAL NUCLEAR STUDY/  NO ISCHEMIA/  EF 63%   COLONOSCOPY  10/2020   COLONOSCOPY WITH PROPOFOL  05/29/2017   CYSTOSCOPY W/ URETERAL STENT PLACEMENT Left 03/26/2013   Procedure: CYSTOSCOPY WITH RETROGRADE PYELOGRAM ;  Surgeon: Milford Cage, MD;  Location: [facility];  Service: Urology;  Laterality: Left;   CYSTOSCOPY WITH RETROGRADE PYELOGRAM, URETEROSCOPY AND STENT PLACEMENT Bilateral 03/19/2013   Procedure: CYSTOSCOPY WITH RETROGRADE PYELOGRAM, BILATERAL URETEROSCOPY AND STENT PLACEMENT LEFT URETER,BILATERAL STONE EXTRACTION , HOLMIUM LASER LEFT URETER;  Surgeon: Milford Cage,  MD;  Location: [facility];  Service: Urology;  Laterality: Bilateral;   CYSTOSCOPY WITH STENT PLACEMENT Left 03/26/2013   Procedure: CYSTOSCOPY WITH STENT PLACEMENT;  Surgeon: Milford Cage, MD;  Location: [facility];  Service: Urology;  Laterality: Left;   CYSTOSCOPY/URETEROSCOPY/HOLMIUM LASER/STENT PLACEMENT Left 07/07/2020   Procedure: CYSTOSCOPY LEFT URETEROSCOPY/HOLMIUM LASER/STENT PLACEMENT;  Surgeon: Crista Elliot, MD;  Location: [facility];  Service: Urology;  Laterality: Left;   DIAGNOSTIC LAPAROSCOPY  04/12/2009   ESOPHAGOGASTRODUODENOSCOPY (EGD) WITH PROPOFOL N/A 02/10/2016   Procedure: ESOPHAGOGASTRODUODENOSCOPY (EGD) WITH PROPOFOL;  Surgeon: Bernette Redbird, MD;  Location: [facility];  Service: Endoscopy;  Laterality: N/A;   KIDNEY SURGERY  1966   BILATERAL URETER'S DILATATION   LUMBAR LAMINECTOMY/DECOMPRESSION MICRODISCECTOMY  05/18/2011   Procedure: LUMBAR LAMINECTOMY/DECOMPRESSION MICRODISCECTOMY;  Surgeon: Javier Docker;  Location: [facility];  Service: Orthopedics;  Laterality: Right;  Decompression Lumbar 4-Lumbar 5  Right    (xray)    LUMBAR LAMINECTOMY/DECOMPRESSION MICRODISCECTOMY N/A 05/06/2021   Procedure: Microlumbar decompression L5-S1;  Surgeon: Jene Every, MD;  Location: [facility];  Service: Orthopedics;  Laterality: N/A;  3 C-bed 90 mins   MELANOMA EXCISION WITH SENTINEL LYMPH NODE BIOPSY  2001   moh's procedure/  RIGHT FOREHEAD AND UPPER EYEBROW   RIGHT LATERAL PAROTIDECTOMY W/ NERVE DISSECTION / RIGHT MODIFIED RADICAL NECK DISSECTION SPARING SCM ELEVENTH NERVE AND INTERNAL JUGULAR VEIN  09-12-2006  DR DWIGHT BATES   DR DWIGHT BATES; "inside gland; lots of lymph nodes"    Family History  Problem Relation Age of Onset   Hypertension Mother    COPD Mother    Breast cancer Mother        breast   Dementia Mother    Heart disease Father    Cancer Father        Colorectal   Hyperlipidemia Father    Hypertension Father     Colon cancer Sister    Social History:  reports that she has never smoked. She has never used smokeless tobacco. She reports that she does not currently use alcohol. She reports that she does not use drugs.  Allergies:  Allergies  Allergen Reactions   Cephalexin Rash and Shortness Of Breath    Keflex   Lorazepam Shortness Of Breath and Other (See Comments)    She may be very sensitive to benzo.    Stops breathing    Promethazine     Felt strange, "knocked me out"   Seasonal Ic [Octacosanol] Other (See Comments)   Current meds: atorvastatin 20 mg tablet carvediloL 3.125 mg tablet Ciprodex 0.3 %-0.1 % ear drops,suspension gabapentin 300 mg capsule lidocaine 5 % topical patch lisinopriL 2.5 mg tablet meloxicam 15 mg tablet nitroglycerin 0.4 mg sublingual tablet omeprazole oxyCODONE 5 mg tablet predniSONE 5 mg tablets in a dose pack  Review of Systems  Constitutional: Negative.   HENT: Negative.  Eyes: Negative.   Respiratory: Negative.    Cardiovascular: Negative.   Gastrointestinal: Negative.   Endocrine: Negative.   Genitourinary: Negative.   Musculoskeletal:  Positive for arthralgias, back pain, gait problem, joint swelling and myalgias.    Last menstrual period 05/18/2011. Physical Exam Constitutional:      Appearance: Normal appearance.  HENT:     Head: Normocephalic and atraumatic.     Right Ear: External ear normal.     Left Ear: External ear normal.     Nose: Nose normal.     Mouth/Throat:     Pharynx: Oropharynx is clear.  Eyes:     Conjunctiva/sclera: Conjunctivae normal.  Cardiovascular:     Rate and Rhythm: Normal rate and regular rhythm.     Pulses: Normal pulses.     Heart sounds: Normal heart sounds.  Pulmonary:     Effort: Pulmonary effort is normal.  Abdominal:     General: Bowel sounds are normal.  Musculoskeletal:     Cervical back: Normal range of motion.     Comments: Straight leg raise negative no radiculopathy. Left knee she is  exquisitely tender to palpation of the insertion of the pes anserinus some tenderness posterior medially. Full extension flexion mild effusion  Neurological:     Mental Status: She is alert.    Outside MRI reviewed by myself demonstrates complete radial tear of the root of the posterior horn of the medial meniscus with peripheral meniscal extrusion. Mild chondromalacia the medial compartment. ACL PCL is intact.  Assessment/Plan Impression:  1. Left knee pain secondary to strain of the insertion of the pes anserinus 2. Possible symptomatic radial root tear tear of the medial meniscus with underlying chondromalacia 3. Mechanical back pain no evidence of a significant neural compressive lesion by MRI of the lumbar  Plan:  Proceed with a pes anserinus insertional injection after sterile prep which she tolerated well.  Follow-up in a week for repeat eval we discussed knee arthroscopy partial meniscectomy. Out of work.  A prescription for an opioid today was given to be taken as directed for pain control. I discussed the risks including cognitive changes which may affect the ability to operate machinery and to drive. In addition the side effect of constipation as well as to avoid taking with conflicting medications that were discussed. The patient's prescription drug monitoring report was reviewed with no red flags noted.  Ibuprofen  Ice.  I recommend she obtain a DonJoy hinged knee brace for her left knee. Use a cane for protected weightbearing  Plan LEFT knee scope, partial medial meniscectomy, debridement  Ambrie Carte M Allisen Pidgeon, PA-C for Dr Leighton Punches 10/02/2023, 5:18 PM

## 2023-10-02 NOTE — H&P (View-Only) (Signed)
 [name] is an 63 y.o. female.   Chief Complaint: left knee pain HPI: Reason for Visit: (normal) review of test results (lumbar and left knee MRIs); 06/20/23 fall Severity: pain level 10/10 Quality: sharp; stabbing Associated Symptoms: instability left knee Medications: Oxycodone-out Lidocaine patches, Ibuprofen, Tylenol Notes: 07/02/23 AO-Meloxicam 07/12/23 [name]- cortisone injection, Percocet, Prednisone X 2 rounds Having more knee pain as opposed to radicular pain  Past Medical History:  Diagnosis Date   Anxiety    Arthritis    back (12/30/2014)   Asthma    as a child, no problems as an adult, has an albuterol inhaler   Cerebral cyst    per brain MRI 07-12-2019 unchanged 6mm cyst inferomedial right frontal lobe   Chronic headaches    Chronic systolic (congestive) heart failure (HCC)    followed by dr Estrella Myrtle   Complication of anesthesia    pt is very sensitive to benzodiazepines, pt stated "almost died"   Compression fracture of lumbar vertebra (HCC) 05/27/2020   per pt L3 and L4   DCM (dilated cardiomyopathy) (HCC)    w/ Takatsubo---  followed by dr Eden Emms---  stress-induced --- cardiac cath 12-30-2014 ef 30-35%, recovered per cardiac MRI 03-15-2015 ef  64%   Depression    Disorder of mastoid    per brain MRI 07-12-2019 persistant large mastoid effusion   Dysuria    Environmental and seasonal allergies    Frequency of urination    GERD (gastroesophageal reflux disease)    History of asthma    childhood   History of esophageal spasm    History of kidney stones    History of melanoma excision 2001   right supraorbital (right forehead and upper eyelid )  s/p  Moh's procedure w/ sln bx,  no recurrence per pt   History of non-ST elevation myocardial infarction (NSTEMI) 12/29/2014   per cath normal coronaries , ef 30-35%---  stress-- induced Takotsubo syndrome    History of palpitations 01/05/2015   STRESS INDUCED   History of parotid cancer 2008   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---  09-12-2006 s/p  right lateral parotidectomy w/ nerve dissection , modified radical neck dissection ;  completed  x35 fractions raditation 2008;  no recurrence per pt   Hyperlipidemia    Hypertension    Kidney cysts    Left ureteral calculus    Low back pain    Melanoma (HCC) 2001   face   Muscle spasm of back    Myocardial infarction (HCC) 2016   Pneumonia    as a child   PONV (postoperative nausea and vomiting)    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   Squamous cell carcinoma of skin 02/22/2021   in [place]- right forehead (CX35FU)   Takotsubo syndrome 12/29/2014   cardiologist---  dr Estrella Myrtle--- dx NSTEMI -- stress-induced w/ DCM--  per cath 12-30-2014 normal coronaries , ef 30-35%,  recovered ef per cardiac MRI  ef 64%   Urgency of urination    Wears glasses     Past Surgical History:  Procedure Laterality Date   BACK SURGERY     BREAST BIOPSY Bilateral early 2000's   CARDIAC CATHETERIZATION  2006 (APPROX)  [place]   NORMAL   CARDIAC CATHETERIZATION  12/30/2014   CARDIAC CATHETERIZATION N/A 12/30/2014   Procedure: Left Heart Cath and Coronary Angiography;  Surgeon: Iran Ouch, MD;  Location: [facility];  Service: Cardiovascular;  Laterality: N/A;  CARDIOVASCULAR STRESS TEST  03-21-2012  DR NISHAN   NORMAL NUCLEAR STUDY/  NO ISCHEMIA/  EF 63%   COLONOSCOPY  10/2020   COLONOSCOPY WITH PROPOFOL  05/29/2017   CYSTOSCOPY W/ URETERAL STENT PLACEMENT Left 03/26/2013   Procedure: CYSTOSCOPY WITH RETROGRADE PYELOGRAM ;  Surgeon: Milford Cage, MD;  Location: [facility];  Service: Urology;  Laterality: Left;   CYSTOSCOPY WITH RETROGRADE PYELOGRAM, URETEROSCOPY AND STENT PLACEMENT Bilateral 03/19/2013   Procedure: CYSTOSCOPY WITH RETROGRADE PYELOGRAM, BILATERAL URETEROSCOPY AND STENT PLACEMENT LEFT URETER,BILATERAL STONE EXTRACTION , HOLMIUM LASER LEFT URETER;  Surgeon: Milford Cage,  MD;  Location: [facility];  Service: Urology;  Laterality: Bilateral;   CYSTOSCOPY WITH STENT PLACEMENT Left 03/26/2013   Procedure: CYSTOSCOPY WITH STENT PLACEMENT;  Surgeon: Milford Cage, MD;  Location: [facility];  Service: Urology;  Laterality: Left;   CYSTOSCOPY/URETEROSCOPY/HOLMIUM LASER/STENT PLACEMENT Left 07/07/2020   Procedure: CYSTOSCOPY LEFT URETEROSCOPY/HOLMIUM LASER/STENT PLACEMENT;  Surgeon: Crista Elliot, MD;  Location: [facility];  Service: Urology;  Laterality: Left;   DIAGNOSTIC LAPAROSCOPY  04/12/2009   ESOPHAGOGASTRODUODENOSCOPY (EGD) WITH PROPOFOL N/A 02/10/2016   Procedure: ESOPHAGOGASTRODUODENOSCOPY (EGD) WITH PROPOFOL;  Surgeon: Bernette Redbird, MD;  Location: [facility];  Service: Endoscopy;  Laterality: N/A;   KIDNEY SURGERY  1966   BILATERAL URETER'S DILATATION   LUMBAR LAMINECTOMY/DECOMPRESSION MICRODISCECTOMY  05/18/2011   Procedure: LUMBAR LAMINECTOMY/DECOMPRESSION MICRODISCECTOMY;  Surgeon: Javier Docker;  Location: [facility];  Service: Orthopedics;  Laterality: Right;  Decompression Lumbar 4-Lumbar 5  Right    (xray)    LUMBAR LAMINECTOMY/DECOMPRESSION MICRODISCECTOMY N/A 05/06/2021   Procedure: Microlumbar decompression L5-S1;  Surgeon: Jene Every, MD;  Location: [facility];  Service: Orthopedics;  Laterality: N/A;  3 C-bed 90 mins   MELANOMA EXCISION WITH SENTINEL LYMPH NODE BIOPSY  2001   moh's procedure/  RIGHT FOREHEAD AND UPPER EYEBROW   RIGHT LATERAL PAROTIDECTOMY W/ NERVE DISSECTION / RIGHT MODIFIED RADICAL NECK DISSECTION SPARING SCM ELEVENTH NERVE AND INTERNAL JUGULAR VEIN  09-12-2006  DR DWIGHT BATES   DR DWIGHT BATES; "inside gland; lots of lymph nodes"    Family History  Problem Relation Age of Onset   Hypertension Mother    COPD Mother    Breast cancer Mother        breast   Dementia Mother    Heart disease Father    Cancer Father        Colorectal   Hyperlipidemia Father    Hypertension Father     Colon cancer Sister    Social History:  reports that she has never smoked. She has never used smokeless tobacco. She reports that she does not currently use alcohol. She reports that she does not use drugs.  Allergies:  Allergies  Allergen Reactions   Cephalexin Rash and Shortness Of Breath    Keflex   Lorazepam Shortness Of Breath and Other (See Comments)    She may be very sensitive to benzo.    Stops breathing    Promethazine     Felt strange, "knocked me out"   Seasonal Ic [Octacosanol] Other (See Comments)   Current meds: atorvastatin 20 mg tablet carvediloL 3.125 mg tablet Ciprodex 0.3 %-0.1 % ear drops,suspension gabapentin 300 mg capsule lidocaine 5 % topical patch lisinopriL 2.5 mg tablet meloxicam 15 mg tablet nitroglycerin 0.4 mg sublingual tablet omeprazole oxyCODONE 5 mg tablet predniSONE 5 mg tablets in a dose pack  Review of Systems  Constitutional: Negative.   HENT: Negative.  Eyes: Negative.   Respiratory: Negative.    Cardiovascular: Negative.   Gastrointestinal: Negative.   Endocrine: Negative.   Genitourinary: Negative.   Musculoskeletal:  Positive for arthralgias, back pain, gait problem, joint swelling and myalgias.    Last menstrual period 05/18/2011. Physical Exam Constitutional:      Appearance: Normal appearance.  HENT:     Head: Normocephalic and atraumatic.     Right Ear: External ear normal.     Left Ear: External ear normal.     Nose: Nose normal.     Mouth/Throat:     Pharynx: Oropharynx is clear.  Eyes:     Conjunctiva/sclera: Conjunctivae normal.  Cardiovascular:     Rate and Rhythm: Normal rate and regular rhythm.     Pulses: Normal pulses.     Heart sounds: Normal heart sounds.  Pulmonary:     Effort: Pulmonary effort is normal.  Abdominal:     General: Bowel sounds are normal.  Musculoskeletal:     Cervical back: Normal range of motion.     Comments: Straight leg raise negative no radiculopathy. Left knee she is  exquisitely tender to palpation of the insertion of the pes anserinus some tenderness posterior medially. Full extension flexion mild effusion  Neurological:     Mental Status: She is alert.    Outside MRI reviewed by myself demonstrates complete radial tear of the root of the posterior horn of the medial meniscus with peripheral meniscal extrusion. Mild chondromalacia the medial compartment. ACL PCL is intact.  Assessment/Plan Impression:  1. Left knee pain secondary to strain of the insertion of the pes anserinus 2. Possible symptomatic radial root tear tear of the medial meniscus with underlying chondromalacia 3. Mechanical back pain no evidence of a significant neural compressive lesion by MRI of the lumbar  Plan:  Proceed with a pes anserinus insertional injection after sterile prep which she tolerated well.  Follow-up in a week for repeat eval we discussed knee arthroscopy partial meniscectomy. Out of work.  A prescription for an opioid today was given to be taken as directed for pain control. I discussed the risks including cognitive changes which may affect the ability to operate machinery and to drive. In addition the side effect of constipation as well as to avoid taking with conflicting medications that were discussed. The patient's prescription drug monitoring report was reviewed with no red flags noted.  Ibuprofen  Ice.  I recommend she obtain a DonJoy hinged knee brace for her left knee. Use a cane for protected weightbearing  Plan LEFT knee scope, partial medial meniscectomy, debridement  Dorothy Spark, PA-C for Dr Shelle Iron 10/02/2023, 5:18 PM

## 2023-10-03 ENCOUNTER — Other Ambulatory Visit: Payer: Self-pay

## 2023-10-03 ENCOUNTER — Encounter (HOSPITAL_COMMUNITY)
Admission: RE | Disposition: A | Discharge status: Home / Self Care | Payer: Self-pay | Source: Ambulatory Visit | Attending: Specialist

## 2023-10-03 ENCOUNTER — Ambulatory Visit (HOSPITAL_COMMUNITY)
Admission: RE | Admit: 2023-10-03 | Discharge: 2023-10-03 | Disposition: A | Discharge status: Home / Self Care | Source: Ambulatory Visit | Attending: Specialist | Admitting: Specialist

## 2023-10-03 ENCOUNTER — Encounter (HOSPITAL_COMMUNITY): Payer: Self-pay | Admitting: Specialist

## 2023-10-03 ENCOUNTER — Ambulatory Visit (HOSPITAL_COMMUNITY): Admitting: Anesthesiology

## 2023-10-03 ENCOUNTER — Ambulatory Visit (HOSPITAL_COMMUNITY): Payer: Self-pay | Admitting: Physician Assistant

## 2023-10-03 DIAGNOSIS — S83242A Other tear of medial meniscus, current injury, left knee, initial encounter: Secondary | ICD-10-CM | POA: Insufficient documentation

## 2023-10-03 DIAGNOSIS — M94262 Chondromalacia, left knee: Secondary | ICD-10-CM | POA: Diagnosis not present

## 2023-10-03 DIAGNOSIS — I5022 Chronic systolic (congestive) heart failure: Secondary | ICD-10-CM | POA: Diagnosis not present

## 2023-10-03 DIAGNOSIS — K219 Gastro-esophageal reflux disease without esophagitis: Secondary | ICD-10-CM | POA: Diagnosis not present

## 2023-10-03 DIAGNOSIS — I110 Hypertensive heart disease with heart failure: Secondary | ICD-10-CM | POA: Insufficient documentation

## 2023-10-03 DIAGNOSIS — I252 Old myocardial infarction: Secondary | ICD-10-CM | POA: Diagnosis not present

## 2023-10-03 DIAGNOSIS — S83282A Other tear of lateral meniscus, current injury, left knee, initial encounter: Secondary | ICD-10-CM | POA: Diagnosis not present

## 2023-10-03 DIAGNOSIS — G8929 Other chronic pain: Secondary | ICD-10-CM | POA: Diagnosis present

## 2023-10-03 DIAGNOSIS — J45909 Unspecified asthma, uncomplicated: Secondary | ICD-10-CM | POA: Diagnosis not present

## 2023-10-03 DIAGNOSIS — M1712 Unilateral primary osteoarthritis, left knee: Secondary | ICD-10-CM | POA: Insufficient documentation

## 2023-10-03 DIAGNOSIS — X58XXXA Exposure to other specified factors, initial encounter: Secondary | ICD-10-CM | POA: Diagnosis not present

## 2023-10-03 DIAGNOSIS — M25562 Pain in left knee: Secondary | ICD-10-CM | POA: Diagnosis present

## 2023-10-03 HISTORY — PX: MENISCUS DEBRIDEMENT: SHX5178

## 2023-10-03 HISTORY — PX: KNEE ARTHROSCOPY WITH MEDIAL MENISECTOMY: SHX5651

## 2023-10-03 SURGERY — ARTHROSCOPY, KNEE, WITH MEDIAL MENISCECTOMY
Anesthesia: General | Site: Knee | Laterality: Left

## 2023-10-03 MED ORDER — BUPIVACAINE-EPINEPHRINE 0.5% -1:200000 IJ SOLN
INTRAMUSCULAR | Status: DC | PRN
  Administered 2023-10-03: 30 mL

## 2023-10-03 MED ORDER — LIDOCAINE HCL (PF) 2 % IJ SOLN
INTRAMUSCULAR | Status: AC
  Filled 2023-10-03: qty 5

## 2023-10-03 MED ORDER — EPINEPHRINE PF 1 MG/ML IJ SOLN
INTRAMUSCULAR | Status: AC
  Filled 2023-10-03: qty 2

## 2023-10-03 MED ORDER — PROPOFOL 10 MG/ML IV BOLUS
INTRAVENOUS | Status: DC | PRN
  Administered 2023-10-03: 200 mg via INTRAVENOUS

## 2023-10-03 MED ORDER — FENTANYL CITRATE (PF) 100 MCG/2ML IJ SOLN
INTRAMUSCULAR | Status: AC
  Filled 2023-10-03: qty 2

## 2023-10-03 MED ORDER — ASPIRIN 81 MG PO TBEC: 81.0000 mg | DELAYED_RELEASE_TABLET | Freq: Two times a day (BID) | ORAL | 1 refills | Status: DC

## 2023-10-03 MED ORDER — GLYCOPYRROLATE 0.2 MG/ML IJ SOLN
INTRAMUSCULAR | Status: AC
  Filled 2023-10-03: qty 1

## 2023-10-03 MED ORDER — LACTATED RINGERS IV SOLN: INTRAVENOUS | Status: DC

## 2023-10-03 MED ORDER — ORAL CARE MOUTH RINSE: 15.0000 mL | Freq: Once | OROMUCOSAL | Status: AC

## 2023-10-03 MED ORDER — OXYCODONE HCL 5 MG PO TABS: 5.0000 mg | ORAL_TABLET | ORAL | 0 refills | Status: DC | PRN

## 2023-10-03 MED ORDER — DEXAMETHASONE SODIUM PHOSPHATE 10 MG/ML IJ SOLN
INTRAMUSCULAR | Status: AC
  Filled 2023-10-03: qty 1

## 2023-10-03 MED ORDER — DEXAMETHASONE SODIUM PHOSPHATE 10 MG/ML IJ SOLN
INTRAMUSCULAR | Status: DC | PRN
  Administered 2023-10-03: 4 mg via INTRAVENOUS

## 2023-10-03 MED ORDER — PHENYLEPHRINE HCL (PRESSORS) 10 MG/ML IV SOLN
INTRAVENOUS | Status: AC
Start: 2023-10-03 — End: ?
  Filled 2023-10-03: qty 1

## 2023-10-03 MED ORDER — ONDANSETRON HCL 4 MG/2ML IJ SOLN
INTRAMUSCULAR | Status: DC | PRN
  Administered 2023-10-03: 4 mg via INTRAVENOUS

## 2023-10-03 MED ORDER — CHLORHEXIDINE GLUCONATE 0.12 % MT SOLN
15.0000 mL | Freq: Once | OROMUCOSAL | Status: AC
  Administered 2023-10-03: 15 mL via OROMUCOSAL

## 2023-10-03 MED ORDER — FENTANYL CITRATE (PF) 100 MCG/2ML IJ SOLN
INTRAMUSCULAR | Status: DC | PRN
  Administered 2023-10-03 (×3): 25 ug via INTRAVENOUS

## 2023-10-03 MED ORDER — VANCOMYCIN HCL IN DEXTROSE 1-5 GM/200ML-% IV SOLN
1000.0000 mg | INTRAVENOUS | Status: AC
  Administered 2023-10-03: 1000 mg via INTRAVENOUS
  Filled 2023-10-03: qty 200

## 2023-10-03 MED ORDER — OXYCODONE HCL 5 MG/5ML PO SOLN: 5.0000 mg | Freq: Once | ORAL | Status: DC | PRN

## 2023-10-03 MED ORDER — LIDOCAINE HCL (CARDIAC) PF 100 MG/5ML IV SOSY
PREFILLED_SYRINGE | INTRAVENOUS | Status: DC | PRN
  Administered 2023-10-03: 60 mg via INTRAVENOUS

## 2023-10-03 MED ORDER — OXYCODONE HCL 5 MG PO TABS: 5.0000 mg | ORAL_TABLET | Freq: Once | ORAL | Status: DC | PRN

## 2023-10-03 MED ORDER — PHENYLEPHRINE HCL (PRESSORS) 10 MG/ML IV SOLN
INTRAVENOUS | Status: AC
  Filled 2023-10-03: qty 1

## 2023-10-03 MED ORDER — VANCOMYCIN HCL 1000 MG IV SOLR
INTRAVENOUS | Status: AC
Start: 2023-10-03 — End: ?
  Filled 2023-10-03: qty 20

## 2023-10-03 MED ORDER — EPINEPHRINE PF 1 MG/ML IJ SOLN
INTRAMUSCULAR | Status: DC | PRN
  Administered 2023-10-03: 1 mL

## 2023-10-03 MED ORDER — PROPOFOL 1000 MG/100ML IV EMUL
INTRAVENOUS | Status: AC
  Filled 2023-10-03: qty 100

## 2023-10-03 MED ORDER — 0.9 % SODIUM CHLORIDE (POUR BTL) OPTIME
TOPICAL | Status: DC | PRN
  Administered 2023-10-03: 1000 mL

## 2023-10-03 MED ORDER — ONDANSETRON HCL 4 MG/2ML IJ SOLN
INTRAMUSCULAR | Status: AC
Start: 2023-10-03 — End: ?
  Filled 2023-10-03: qty 2

## 2023-10-03 MED ORDER — MEPERIDINE HCL 50 MG/ML IJ SOLN: 6.2500 mg | INTRAMUSCULAR | Status: DC | PRN

## 2023-10-03 MED ORDER — BUPIVACAINE HCL (PF) 0.5 % IJ SOLN
INTRAMUSCULAR | Status: AC
Start: 2023-10-03 — End: ?
  Filled 2023-10-03: qty 30

## 2023-10-03 MED ORDER — GENTAMICIN IN SALINE 1.6-0.9 MG/ML-% IV SOLN
80.0000 mg | INTRAVENOUS | Status: AC
  Administered 2023-10-03: 80 mg via INTRAVENOUS
  Filled 2023-10-03: qty 50

## 2023-10-03 MED ORDER — SODIUM CHLORIDE 0.9 % IR SOLN
Status: DC | PRN
  Administered 2023-10-03: 3000 mL

## 2023-10-03 MED ORDER — HYDROMORPHONE HCL 1 MG/ML IJ SOLN: 0.2500 mg | INTRAMUSCULAR | Status: DC | PRN

## 2023-10-03 MED ORDER — AMISULPRIDE (ANTIEMETIC) 5 MG/2ML IV SOLN: 10.0000 mg | Freq: Once | INTRAVENOUS | Status: DC | PRN

## 2023-10-03 SURGICAL SUPPLY — 23 items
BAG COUNTER SPONGE SURGICOUNT (BAG) ×1 IMPLANT
BLADE SHAVER TORPEDO 4X13 (MISCELLANEOUS) IMPLANT
BNDG ELASTIC 6INX 5YD STR LF (GAUZE/BANDAGES/DRESSINGS) ×1 IMPLANT
BOOTIES KNEE HIGH SLOAN (MISCELLANEOUS) ×2 IMPLANT
COVER SURGICAL LIGHT HANDLE (MISCELLANEOUS) ×1 IMPLANT
DURAPREP 26ML APPLICATOR (WOUND CARE) ×1 IMPLANT
DW OUTFLOW CASSETTE/TUBE SET (MISCELLANEOUS) ×1 IMPLANT
GAUZE PAD ABD 8X10 STRL (GAUZE/BANDAGES/DRESSINGS) ×1 IMPLANT
GAUZE SPONGE 4X4 12PLY STRL (GAUZE/BANDAGES/DRESSINGS) ×1 IMPLANT
GLOVE BIOGEL PI IND STRL 7.0 (GLOVE) ×1 IMPLANT
GLOVE SURG SS PI 8.0 STRL IVOR (GLOVE) ×1 IMPLANT
GOWN STRL REUS W/ TWL XL LVL3 (GOWN DISPOSABLE) ×2 IMPLANT
KIT TURNOVER KIT A (KITS) IMPLANT
MANIFOLD NEPTUNE II (INSTRUMENTS) ×1 IMPLANT
PACK ARTHROSCOPY WL (CUSTOM PROCEDURE TRAY) ×1 IMPLANT
PADDING CAST COTTON 6X4 STRL (CAST SUPPLIES) ×1 IMPLANT
PENCIL SMOKE EVACUATOR (MISCELLANEOUS) IMPLANT
SUT ETHILON 4 0 PS 2 18 (SUTURE) ×1 IMPLANT
TOWEL OR 17X26 10 PK STRL BLUE (TOWEL DISPOSABLE) ×1 IMPLANT
TUBING ARTHROSCOPY IRRIG 16FT (MISCELLANEOUS) ×1 IMPLANT
WAND APOLLORF SJ50 AR-9845 (SURGICAL WAND) IMPLANT
WIPE CHG 2% PREP (PERSONAL CARE ITEMS) ×1 IMPLANT
WRAP KNEE MAXI GEL POST OP (GAUZE/BANDAGES/DRESSINGS) ×1 IMPLANT

## 2023-10-03 NOTE — Discharge Instructions (Signed)
Keep dressing clean and dry at all times. After 2 days, remove dressing and place bandaids over incisions. Do not kneel or squat. Ice and elevate, toes above the nose, 20-30 min at a time, 4-5 times per day to reduce swelling. Take aspirin 81mg once daily to prevent blood clots. Follow up in 10-14 days for suture removal. 

## 2023-10-03 NOTE — Transfer of Care (Signed)
 Immediate Anesthesia Transfer of Care Note  Patient: [name]  Procedure(s) Performed: ARTHROSCOPY, KNEE, WITH MEDIAL MENISCECTOMY (Left: Knee) DEBRIDEMENT, MENISCUS, KNEE (Left: Knee)  Patient Location: PACU  Anesthesia Type:General  Level of Consciousness: awake, alert , and oriented  Airway & Oxygen Therapy: Patient Spontanous Breathing and Patient connected to face mask oxygen  Post-op Assessment: Report given to RN and Post -op Vital signs reviewed and stable  Post vital signs: Reviewed and stable  Last Vitals:  Vitals Value Taken Time  BP 165/86 10/03/23 0933  Temp    Pulse 78 10/03/23 0936  Resp 12 10/03/23 0936  SpO2 100 % 10/03/23 0936  Vitals shown include unfiled device data.  Last Pain:  Vitals:   10/03/23 0652  TempSrc: Oral  PainSc: 0-No pain         Complications: No notable events documented.

## 2023-10-03 NOTE — Anesthesia Procedure Notes (Signed)
 Procedure Name: LMA Insertion Date/Time: 10/03/2023 8:45 AM  Performed by: Norvell Beers, CRNAPre-anesthesia Checklist: Patient identified, Emergency Drugs available, Suction available and Patient being monitored Patient Re-evaluated:Patient Re-evaluated prior to induction Oxygen Delivery Method: Circle system utilized Preoxygenation: Pre-oxygenation with 100% oxygen Induction Type: IV induction LMA: LMA inserted LMA Size: 4.0 Number of attempts: 1 Placement Confirmation: positive ETCO2 Tube secured with: Tape Dental Injury: Teeth and Oropharynx as per pre-operative assessment

## 2023-10-03 NOTE — Brief Op Note (Signed)
 10/03/2023  8:12 AM  PATIENT:  [name]  63 y.o. female  PRE-OPERATIVE DIAGNOSIS:  Left knee medial meniscus tear, degenerative joint disease  POST-OPERATIVE DIAGNOSIS:  * No post-op diagnosis entered *  PROCEDURE:  Procedure(s) with comments: ARTHROSCOPY, KNEE, WITH MEDIAL MENISCECTOMY (Left) - Left knee arthroscopy, partial medial meniscectomy and debridement DEBRIDEMENT, MENISCUS, KNEE (Left)  SURGEON:  Surgeons and Role:    Orvan Blanch, MD - Primary  PHYSICIAN ASSISTANT:   ASSISTANTS: Bissell   ANESTHESIA:   general  EBL:  min   BLOOD ADMINISTERED:none  DRAINS: none   LOCAL MEDICATIONS USED:  MARCAINE     SPECIMEN:  No Specimen  DISPOSITION OF SPECIMEN:  N/A  COUNTS:  YES  TOURNIQUET:  * No tourniquets in log *  DICTATION: .Other Dictation: Dictation Number [id number]  PLAN OF CARE: Discharge to home after PACU  PATIENT DISPOSITION:  PACU - hemodynamically stable.   Delay start of Pharmacological VTE agent (>24hrs) due to surgical blood loss or risk of bleeding: no

## 2023-10-03 NOTE — Interval H&P Note (Signed)
 History and Physical Interval Note:  10/03/2023 8:11 AM  [name]  has presented today for surgery, with the diagnosis of Left knee medial meniscus tear, degenerative joint disease.  The various methods of treatment have been discussed with the patient and family. After consideration of risks, benefits and other options for treatment, the patient has consented to  Procedure(s) with comments: ARTHROSCOPY, KNEE, WITH MEDIAL MENISCECTOMY (Left) - Left knee arthroscopy, partial medial meniscectomy and debridement DEBRIDEMENT, MENISCUS, KNEE (Left) as a surgical intervention.  The patient's history has been reviewed, patient examined, no change in status, stable for surgery.  I have reviewed the patient's chart and labs.  Questions were answered to the patient's satisfaction.     Loel Ring

## 2023-10-03 NOTE — Anesthesia Postprocedure Evaluation (Signed)
 Anesthesia Post Note  Patient: SHAMIA UPPAL  Procedure(s) Performed: ARTHROSCOPY, KNEE, WITH MEDIAL MENISCECTOMY (Left: Knee) DEBRIDEMENT, MENISCUS, KNEE (Left: Knee)     Patient location during evaluation: PACU Anesthesia Type: General Level of consciousness: awake and alert Pain management: pain level controlled Vital Signs Assessment: post-procedure vital signs reviewed and stable Respiratory status: spontaneous breathing, nonlabored ventilation and respiratory function stable Cardiovascular status: blood pressure returned to baseline and stable Postop Assessment: no apparent nausea or vomiting Anesthetic complications: no   No notable events documented.  Last Vitals:  Vitals:   10/03/23 1000 10/03/23 1017  BP: (!) 186/93 (!) 140/107  Pulse: 79 79  Resp: 13 16  Temp: 36.7 C 36.7 C  SpO2: 96% 97%    Last Pain:  Vitals:   10/03/23 1017  TempSrc:   PainSc: 0-No pain                 Earvin Goldberg

## 2023-10-04 ENCOUNTER — Encounter (HOSPITAL_COMMUNITY): Payer: Self-pay | Admitting: Specialist

## 2023-10-04 NOTE — Op Note (Signed)
 NAMEJOIE, HIPPS MEDICAL RECORD NO: [id number] ACCOUNT NO: [id number] DATE OF BIRTH: 19-Dec-1960 FACILITY: [facility] LOCATION: [facility] PH[facility]ICIAN: Javier Docker, MD  Operative Report   DATE OF PROCEDURE: 10/03/2023  PREOPERATIVE DIAGNOSIS:  Medial meniscus tear, DJD left knee.  POSTOPERATIVE DIAGNOSES: 1.  Posterior horn medial meniscus tear. 2.  Anterior third lateral meniscus tear. 3.  Grade 3 chondromalacia of the medial femoral condyle, medial tibial plateau, patella, femoral sulcus.  PROCEDURE PERFORMED: 1.  Left knee arthroscopy. 2.  Partial medial and lateral meniscectomy. 3.  Chondroplasty medial femoral condyle, medial tibial plateau, patella, femoral sulcus.  ANESTHESIA:  General.  ASSISTANT: Orland Penman, PA.  HISTORY:  This is a 63 year old with medial knee pain, mechanical symptoms.  MRI indicating posterior horn and root meniscus tear and chondromalacia of the medial compartment indicated for knee arthroscopy, partial meniscectomy.  Risks and benefits  discussed including bleeding, infection, damage to neurovascular structures, no change in symptoms, worsening symptoms, DVT, PE, anesthetic complications, etc.  DESCRIPTION OF PROCEDURE:  The patient is in supine position.  After induction of adequate general anesthesia and vancomycin for antimicrobial prophylaxis the left lower extremity was prepped and draped in the usual sterile fashion.  She also had  gentamicin.  A lateral parapatellar portal was fashioned with a #11 blade and aggressive cannulated atraumatic place.  The irrigant was utilized to insulate the joint.  Under direct visualization, the medial parapatellar portal was fashioned with a #11  blade after localization with an 18-gauge needle sparing the medial meniscus.  Then, there was a large grade III lesion in the medial femoral condyle and some grade 3 changes of the tibial plateau.  Light chondroplasty performed of both.  Femoral condyle  was  weightbearing surface 3 x 2 cm.  No grade 4 change.  There was some delamination noted.  There is a posterior root and horn tear.  The meniscus though was stable to probe palpation.  I introduced a shaver, debrided it to a stable base posteriorly as  well as an ArthroWand and following the resection of a portion of the meniscus it was stable to probe palpation still.  Next, suprapatellar pouch, grade 3 changes of the patella in the sulcus.  Light chondroplasty performed there both.  There was normal patellofemoral tracking.  Gutters were unremarkable.  Lateral compartment revealed radial tearing of the anterior horn of the lateral meniscus in the anterior third.  I introduced a shaver and debrided to a stable base.  Minor grade 3 changes of the tibial plateau.  Remnant meniscus stable to probe  palpation.  ACL was without evidence of tear.  There was hypertrophic synovitis in the notch and partial synovectomy was performed there.  I revisited all compartments.  There were no further pathology amenable to arthroscopic intervention.  I therefore removed all instrumentation.  Portals were closed with 4-0 nylon simple sutures.  0.25% Marcaine with epinephrine was infiltrated in the  joint.  Wound was dressed sterilely.  The patient was awoken without difficulty and transported to the recovery room in satisfactory condition.  The patient tolerated the procedure well.  There were no complications.  Assistant, Orland Penman, PA was used throughout the case for patient positioning, placing the leg in the figure-of-four position to gain access to the tight lateral  compartment.  Closure, monitoring inflow and outflow.  Minimal blood loss.   PUS D: 10/03/2023 9:30:01 am T: 10/04/2023 1:56:00 am  JOB: [id number]/ [id number]

## 2023-10-12 ENCOUNTER — Ambulatory Visit: Payer: BC Managed Care – PPO | Admitting: Family Medicine

## 2023-10-12 ENCOUNTER — Encounter: Payer: Self-pay | Admitting: Family Medicine

## 2023-10-12 VITALS — BP 119/78 | HR 94 | Ht 64.0 in | Wt 201.0 lb

## 2023-10-12 DIAGNOSIS — I10 Essential (primary) hypertension: Secondary | ICD-10-CM

## 2023-10-12 DIAGNOSIS — K219 Gastro-esophageal reflux disease without esophagitis: Secondary | ICD-10-CM | POA: Diagnosis not present

## 2023-10-12 DIAGNOSIS — E782 Mixed hyperlipidemia: Secondary | ICD-10-CM | POA: Diagnosis not present

## 2023-10-12 DIAGNOSIS — Z23 Encounter for immunization: Secondary | ICD-10-CM

## 2023-10-12 NOTE — Progress Notes (Signed)
 BP 119/78   Pulse 94   Ht 5\' 4"  (1.626 m)   Wt 201 lb (91.2 kg)   LMP 05/18/2011   SpO2 98%   BMI 34.50 kg/m    Subjective:   Patient ID: [name], female    DOB: 08-06-1960, 63 y.o.   MRN: [id number]  HPI: [name] is a 63 y.o. female presenting on 10/12/2023 for Medical Management of Chronic Issues, Hyperlipidemia, and Hypertension   HPI Hypertension Patient is currently on carvedilol  and lisinopril , and their blood pressure today is 119/78. Patient denies any lightheadedness or dizziness. Patient denies headaches, blurred vision, chest pains, shortness of breath, or weakness. Denies any side effects from medication and is content with current medication.   Hyperlipidemia Patient is coming in for recheck of his hyperlipidemia. The patient is currently taking atorvastatin . They deny any issues with myalgias or history of liver damage from it. They deny any focal numbness or weakness or chest pain.   GERD Patient is currently on pantoprazole .  She denies any major symptoms or abdominal pain or belching or burping. She denies any blood in her stool or lightheadedness or dizziness.   Relevant past medical, surgical, family and social history reviewed and updated as indicated. Interim medical history since our last visit reviewed. Allergies and medications reviewed and updated.  Review of Systems  Constitutional:  Negative for chills and fever.  Eyes:  Negative for visual disturbance.  Respiratory:  Negative for chest tightness and shortness of breath.   Cardiovascular:  Negative for chest pain and leg swelling.  Genitourinary:  Negative for difficulty urinating and dysuria.  Musculoskeletal:  Positive for arthralgias and joint swelling. Negative for back pain and gait problem.  Skin:  Negative for rash.  Neurological:  Negative for dizziness, light-headedness and headaches.  Psychiatric/Behavioral:  Negative for agitation and behavioral problems.   All other  systems reviewed and are negative.   Per HPI unless specifically indicated above   Allergies as of 10/12/2023       Reactions   Cephalexin Rash, Shortness Of Breath   Keflex   Lorazepam  Shortness Of Breath, Other (See Comments)   She may be very sensitive to benzo.    Stops breathing    Promethazine     Felt strange, "knocked me out"   Seasonal Ic [octacosanol] Other (See Comments)        Medication List        Accurate as of October 12, 2023 10:20 AM. If you have any questions, ask your nurse or doctor.          STOP taking these medications    aspirin  EC 81 MG tablet Stopped by: Lucio Sabin Jazmarie Biever       TAKE these medications    atorvastatin  40 MG tablet Commonly known as: LIPITOR  Take 1 tablet (40 mg total) by mouth daily. What changed: when to take this   calcium  carbonate 500 MG chewable tablet Commonly known as: TUMS - dosed in mg elemental calcium  Chew 1,000 mg by mouth daily as needed for indigestion or heartburn.   carvedilol  3.125 MG tablet Commonly known as: COREG  Take 1 tablet (3.125 mg total) by mouth 2 (two) times daily with a meal.   D-3-5 PO Take 1 tablet by mouth daily.   docusate sodium  100 MG capsule Commonly known as: Colace Take 1 capsule (100 mg total) by mouth 2 (two) times daily as needed for mild constipation.   estradiol  0.1 MG/GM vaginal cream Commonly known  as: ESTRACE  Discard plastic applicator. Insert a blueberry size amount (approximately 1 gram) of cream on fingertip inside vagina at bedtime every night for 1 week then every other night. For long term use.   famotidine  20 MG tablet Commonly known as: PEPCID  Take 40 mg by mouth at bedtime.   gabapentin  300 MG capsule Commonly known as: NEURONTIN  Take 900 mg by mouth at bedtime.   lisinopril  2.5 MG tablet Commonly known as: ZESTRIL  Take 1 tablet (2.5 mg total) by mouth every morning.   milk thistle 175 MG tablet Take 175 mg by mouth daily.   nitroGLYCERIN  0.4 MG  SL tablet Commonly known as: NITROSTAT  Place 1 tablet (0.4 mg total) under the tongue every 5 (five) minutes as needed for chest pain. DISSOLVE ONE TABLET UNDER THE TONGUE EVERY 5 MINUTES AS NEEDED FOR CHEST PAIN.  DO NOT EXCEED A TOTAL OF 3 DOSES IN 15 MINUTES   nortriptyline  10 MG capsule Commonly known as: PAMELOR  Take 3 capsules (30 mg total) by mouth at bedtime.   oxyCODONE  5 MG immediate release tablet Commonly known as: Oxy IR/ROXICODONE  Take 1 tablet (5 mg total) by mouth every 4 (four) hours as needed for severe pain (pain score 7-10).   pantoprazole  40 MG tablet Commonly known as: Protonix  Take 1 tablet (40 mg total) by mouth daily.   vitamin B-12 500 MCG tablet Commonly known as: CYANOCOBALAMIN Take 500 mcg by mouth daily.         Objective:   BP 119/78   Pulse 94   Ht 5\' 4"  (1.626 m)   Wt 201 lb (91.2 kg)   LMP 05/18/2011   SpO2 98%   BMI 34.50 kg/m   Wt Readings from Last 3 Encounters:  10/12/23 201 lb (91.2 kg)  10/03/23 200 lb (90.7 kg)  09/26/23 200 lb (90.7 kg)    Physical Exam Vitals and nursing note reviewed.  Constitutional:      General: She is not in acute distress.    Appearance: She is well-developed. She is not diaphoretic.  Eyes:     Conjunctiva/sclera: Conjunctivae normal.  Cardiovascular:     Rate and Rhythm: Normal rate and regular rhythm.     Heart sounds: Normal heart sounds. No murmur heard. Pulmonary:     Effort: Pulmonary effort is normal. No respiratory distress.     Breath sounds: Normal breath sounds. No wheezing.  Abdominal:     General: Abdomen is flat. Bowel sounds are normal. There is no distension.     Palpations: Abdomen is soft.     Tenderness: There is no abdominal tenderness. There is no guarding or rebound.     Hernia: No hernia is present.  Musculoskeletal:        General: Swelling (Trace bilateral lower extremity edema) present. No tenderness. Normal range of motion.  Skin:    General: Skin is warm and dry.      Findings: No rash.  Neurological:     Mental Status: She is alert and oriented to person, place, and time.     Coordination: Coordination normal.  Psychiatric:        Behavior: Behavior normal.       Assessment & Plan:   Problem List Items Addressed This Visit       Cardiovascular and Mediastinum   Essential (primary) hypertension - Primary (Chronic)   Relevant Orders   CBC with Differential/Platelet   CMP14+EGFR   Lipid panel     Digestive   GERD (gastroesophageal reflux  disease)   Relevant Orders   CBC with Differential/Platelet   CMP14+EGFR   Lipid panel     Other   Hyperlipidemia (Chronic)   Relevant Orders   CBC with Differential/Platelet   CMP14+EGFR   Lipid panel    Blood pressure everything looks good.  Give pneumonia vaccine today.  Recovering from left knee arthroscopy and seems to be doing well. Follow up plan: Return in about 6 months (around 04/12/2024), or if symptoms worsen or fail to improve, for Hypertension and hyperlipidemia and GERD recheck, will also need Medicare wellness visit soon.  Counseling provided for all of the vaccine components Orders Placed This Encounter  Procedures   CBC with Differential/Platelet   CMP14+EGFR   Lipid panel    Jolyne Needs, MD [facility] 10/12/2023, 10:20 AM

## 2023-10-13 LAB — CMP14+EGFR
ALT: 28 IU/L (ref 0–32)
AST: 25 IU/L (ref 0–40)
Albumin: 4.4 g/dL (ref 3.9–4.9)
Alkaline Phosphatase: 92 IU/L (ref 44–121)
BUN/Creatinine Ratio: 14 (ref 12–28)
BUN: 13 mg/dL (ref 8–27)
Bilirubin Total: 0.5 mg/dL (ref 0.0–1.2)
CO2: 22 mmol/L (ref 20–29)
Calcium: 9.2 mg/dL (ref 8.7–10.3)
Chloride: 102 mmol/L (ref 96–106)
Creatinine, Ser: 0.93 mg/dL (ref 0.57–1.00)
Globulin, Total: 2.2 g/dL (ref 1.5–4.5)
Glucose: 101 mg/dL — ABNORMAL HIGH (ref 70–99)
Potassium: 4 mmol/L (ref 3.5–5.2)
Sodium: 140 mmol/L (ref 134–144)
Total Protein: 6.6 g/dL (ref 6.0–8.5)
eGFR: 69 mL/min/{1.73_m2} (ref >59)

## 2023-10-13 LAB — CBC WITH DIFFERENTIAL/PLATELET
Basophils Absolute: 0 10*3/uL (ref 0.0–0.2)
Basos: 0 %
EOS (ABSOLUTE): 0.1 10*3/uL (ref 0.0–0.4)
Eos: 2 %
Hematocrit: 37.9 % (ref 34.0–46.6)
Hemoglobin: 12.8 g/dL (ref 11.1–15.9)
Immature Grans (Abs): 0 10*3/uL (ref 0.0–0.1)
Immature Granulocytes: 0 %
Lymphocytes Absolute: 1.1 10*3/uL (ref 0.7–3.1)
Lymphs: 22 %
MCH: 31.7 pg (ref 26.6–33.0)
MCHC: 33.8 g/dL (ref 31.5–35.7)
MCV: 94 fL (ref 79–97)
Monocytes Absolute: 0.5 10*3/uL (ref 0.1–0.9)
Monocytes: 10 %
Neutrophils Absolute: 3.3 10*3/uL (ref 1.4–7.0)
Neutrophils: 66 %
Platelets: 194 10*3/uL (ref 150–450)
RBC: 4.04 x10E6/uL (ref 3.77–5.28)
RDW: 12.5 % (ref 11.7–15.4)
WBC: 5 10*3/uL (ref 3.4–10.8)

## 2023-10-13 LAB — LIPID PANEL
Chol/HDL Ratio: 3.4 ratio (ref 0.0–4.4)
Cholesterol, Total: 173 mg/dL (ref 100–199)
HDL: 51 mg/dL (ref >39)
LDL Chol Calc (NIH): 85 mg/dL (ref 0–99)
Triglycerides: 223 mg/dL — ABNORMAL HIGH (ref 0–149)
VLDL Cholesterol Cal: 37 mg/dL (ref 5–40)

## 2023-10-19 ENCOUNTER — Encounter: Payer: Self-pay | Admitting: Family Medicine

## 2023-11-06 DIAGNOSIS — K1379 Other lesions of oral mucosa: Secondary | ICD-10-CM | POA: Insufficient documentation

## 2023-11-15 ENCOUNTER — Encounter: Admitting: Family Medicine

## 2023-11-23 DIAGNOSIS — D649 Anemia, unspecified: Secondary | ICD-10-CM | POA: Insufficient documentation

## 2023-11-23 DIAGNOSIS — N952 Postmenopausal atrophic vaginitis: Secondary | ICD-10-CM | POA: Insufficient documentation

## 2023-11-23 NOTE — Progress Notes (Deleted)
 Name: [name] DOB: 09/11/1960 MRN: [id number]  History of Present Illness: [name] is a 63 y.o. female who presents today for follow up visit at [facility].  Relevant History includes: 1. Interstitial cystitis with urinary frequency and urgency. 2. Chronic pelvic pain. 3. Chronic microscopic hematuria. 4. Vaginal atrophy. 5. Kidney stones (including uric acid stones). - Reports prior stone procedures; was previously followed at [facility].  - 08/01/2023: CT showed no GU stones, masses, or hydronephrosis; bladder unremarkable. Moderate fecal retention throughout the colon consistent with constipation. 6. Prior GU surgery: As a child she was having a lot of bedwetting and for reasons she cannot recall she had some kind of bilateral distal ureteral reconstruction.   At initial visit on 08/27/2023: - Denied bladder pain; reported chronic dysuria, frequency, and urgency. Voiding 10-12x/day. Rare nocturia.  - Started topical vaginal estrogen cream.    Today: She {Actions; denies-reports:120008} bladder pain***which is described as ***constant ***intermittent and {Mild/Moderate/Severe:20260} on average. She {Actions; denies-reports:120008} urinary ***frequency, ***nocturia, and ***urgency. Voiding ***x/day and ***x/night on average.  She {Actions; denies-reports:120008} ***dysuria, ***gross hematuria, ***straining to void, or ***sensations of incomplete emptying.  She {HAS HAS LOV:56433} been using vaginal estrogen cream at a frequency of *** time(s) per week. She {Actions; denies-reports:120008} vaginal pain, bleeding, abnormal discharge, itching, dryness.  Current treatment regimen includes: - Gabapentin  300 mg 3x/day - Nortriptyline  - topical vaginal estrogen cream   Previous treatments have included: - Hydroxyzine - Amitriptyline   Medications: Current Outpatient Medications  Medication Sig Dispense Refill   atorvastatin  (LIPITOR ) 40 MG tablet  Take 1 tablet (40 mg total) by mouth daily. (Patient taking differently: Take 40 mg by mouth at bedtime.) 90 tablet 3   calcium  carbonate (TUMS - DOSED IN MG ELEMENTAL CALCIUM ) 500 MG chewable tablet Chew 1,000 mg by mouth daily as needed for indigestion or heartburn.     carvedilol  (COREG ) 3.125 MG tablet Take 1 tablet (3.125 mg total) by mouth 2 (two) times daily with a meal. 180 tablet 3   Cholecalciferol  (D-3-5 PO) Take 1 tablet by mouth daily.     docusate sodium  (COLACE) 100 MG capsule Take 1 capsule (100 mg total) by mouth 2 (two) times daily as needed for mild constipation. 30 capsule 1   estradiol  (ESTRACE ) 0.1 MG/GM vaginal cream Discard plastic applicator. Insert a blueberry size amount (approximately 1 gram) of cream on fingertip inside vagina at bedtime every night for 1 week then every other night. For long term use. 30 g 3   famotidine  (PEPCID ) 20 MG tablet Take 40 mg by mouth at bedtime.     gabapentin  (NEURONTIN ) 300 MG capsule Take 900 mg by mouth at bedtime.     lisinopril  (ZESTRIL ) 2.5 MG tablet Take 1 tablet (2.5 mg total) by mouth every morning. 90 tablet 3   milk thistle 175 MG tablet Take 175 mg by mouth daily.     nitroGLYCERIN  (NITROSTAT ) 0.4 MG SL tablet Place 1 tablet (0.4 mg total) under the tongue every 5 (five) minutes as needed for chest pain. DISSOLVE ONE TABLET UNDER THE TONGUE EVERY 5 MINUTES AS NEEDED FOR CHEST PAIN.  DO NOT EXCEED A TOTAL OF 3 DOSES IN 15 MINUTES 25 tablet 3   nortriptyline  (PAMELOR ) 10 MG capsule Take 3 capsules (30 mg total) by mouth at bedtime. 270 capsule 3   oxyCODONE  (OXY IR/ROXICODONE ) 5 MG immediate release tablet Take 1 tablet (5 mg total) by mouth every 4 (four) hours as needed for  severe pain (pain score 7-10). 40 tablet 0   pantoprazole  (PROTONIX ) 40 MG tablet Take 1 tablet (40 mg total) by mouth daily. 30 tablet 0   vitamin B-12 (CYANOCOBALAMIN) 500 MCG tablet Take 500 mcg by mouth daily.     No current facility-administered  medications for this visit.    Allergies: Allergies  Allergen Reactions   Cephalexin Rash and Shortness Of Breath    Keflex   Lorazepam  Shortness Of Breath and Other (See Comments)    She may be very sensitive to benzo.    Stops breathing    Promethazine      Felt strange, "knocked me out"   Seasonal Ic [Octacosanol] Other (See Comments)    Past Medical History:  Diagnosis Date   Anxiety    Arthritis    back (12/30/2014)   Asthma    as a child, no problems as an adult, has an albuterol  inhaler   Cerebral cyst    per brain MRI 07-12-2019 unchanged 6mm cyst inferomedial right frontal lobe   Chronic headaches    Chronic systolic (congestive) heart failure (HCC)    followed by dr Dillard Frame   Complication of anesthesia    pt is very sensitive to benzodiazepines, pt stated "almost died"   Compression fracture of lumbar vertebra (HCC) 05/27/2020   per pt L3 and L4   DCM (dilated cardiomyopathy) (HCC)    w/ Takatsubo---  followed by dr Stann Earnest---  stress-induced --- cardiac cath 12-30-2014 ef 30-35%, recovered per cardiac MRI 03-15-2015 ef  64%   Depression    Disorder of mastoid    per brain MRI 07-12-2019 persistant large mastoid effusion   Dysuria    Environmental and seasonal allergies    Frequency of urination    GERD (gastroesophageal reflux disease)    History of asthma    childhood   History of esophageal spasm    History of kidney stones    History of melanoma excision 2001   right supraorbital (right forehead and upper eyelid )  s/p  Moh's procedure w/ sln bx,  no recurrence per pt   History of non-ST elevation myocardial infarction (NSTEMI) 12/29/2014   per cath normal coronaries , ef 30-35%---  stress-- induced Takotsubo syndrome    History of palpitations 01/05/2015   STRESS INDUCED   History of parotid cancer 2008   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-2006 s/p  right lateral parotidectomy w/ nerve dissection , modified radical neck  dissection ;  completed  x35 fractions raditation 2008;  no recurrence per pt   Hyperlipidemia    Hypertension    Kidney cysts    Left ureteral calculus    Low back pain    Melanoma (HCC) 2001   face   Muscle spasm of back    Myocardial infarction (HCC) 2016   Pneumonia    as a child   PONV (postoperative nausea and vomiting)    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   Squamous cell carcinoma of skin 02/22/2021   in istu- right forehead ([id number])   Takotsubo syndrome 12/29/2014   cardiologist---  dr Dillard Frame--- dx NSTEMI -- stress-induced w/ DCM--  per cath 12-30-2014 normal coronaries , ef 30-35%,  recovered ef per cardiac MRI  ef 64%   Urgency of urination    Wears glasses    Past Surgical History:  Procedure Laterality Date   BACK SURGERY     BREAST BIOPSY Bilateral early 2000's   CARDIAC CATHETERIZATION  2006 (APPROX)  [place]   NORMAL   CARDIAC CATHETERIZATION  12/30/2014   CARDIAC CATHETERIZATION N/A 12/30/2014   Procedure: Left Heart Cath and Coronary Angiography;  Surgeon: Wenona Hamilton, MD;  Location: [facility];  Service: Cardiovascular;  Laterality: N/A;   CARDIOVASCULAR STRESS TEST  03-21-2012  DR Stann Earnest   NORMAL NUCLEAR STUDY/  NO ISCHEMIA/  EF 63%   COLONOSCOPY  10/2020   COLONOSCOPY WITH PROPOFOL   05/29/2017   CYSTOSCOPY W/ URETERAL STENT PLACEMENT Left 03/26/2013   Procedure: CYSTOSCOPY WITH RETROGRADE PYELOGRAM ;  Surgeon: Soledad Dupes, MD;  Location: [facility];  Service: Urology;  Laterality: Left;   CYSTOSCOPY WITH RETROGRADE PYELOGRAM, URETEROSCOPY AND STENT PLACEMENT Bilateral 03/19/2013   Procedure: CYSTOSCOPY WITH RETROGRADE PYELOGRAM, BILATERAL URETEROSCOPY AND STENT PLACEMENT LEFT URETER,BILATERAL STONE EXTRACTION , HOLMIUM LASER LEFT URETER;  Surgeon: Soledad Dupes, MD;  Location: [facility];  Service: Urology;  Laterality: Bilateral;   CYSTOSCOPY WITH STENT PLACEMENT Left 03/26/2013   Procedure:  CYSTOSCOPY WITH STENT PLACEMENT;  Surgeon: Soledad Dupes, MD;  Location: [facility];  Service: Urology;  Laterality: Left;   CYSTOSCOPY/URETEROSCOPY/HOLMIUM LASER/STENT PLACEMENT Left 07/07/2020   Procedure: CYSTOSCOPY LEFT URETEROSCOPY/HOLMIUM LASER/STENT PLACEMENT;  Surgeon: Samson Croak, MD;  Location: [facility];  Service: Urology;  Laterality: Left;   DIAGNOSTIC LAPAROSCOPY  04/12/2009   ESOPHAGOGASTRODUODENOSCOPY (EGD) WITH PROPOFOL  N/A 02/10/2016   Procedure: ESOPHAGOGASTRODUODENOSCOPY (EGD) WITH PROPOFOL ;  Surgeon: Lanita Pitman, MD;  Location: [facility];  Service: Endoscopy;  Laterality: N/A;   KIDNEY SURGERY  1966   BILATERAL URETER'S DILATATION   KNEE ARTHROSCOPY WITH MEDIAL MENISECTOMY Left 10/03/2023   Procedure: ARTHROSCOPY, KNEE, WITH MEDIAL MENISCECTOMY;  Surgeon: Orvan Blanch, MD;  Location: [facility];  Service: Orthopedics;  Laterality: Left;  Left knee arthroscopy, partial medial meniscectomy and debridement   LUMBAR LAMINECTOMY/DECOMPRESSION MICRODISCECTOMY  05/18/2011   Procedure: LUMBAR LAMINECTOMY/DECOMPRESSION MICRODISCECTOMY;  Surgeon: Loel Ring;  Location: [facility];  Service: Orthopedics;  Laterality: Right;  Decompression Lumbar 4-Lumbar 5  Right    (xray)    LUMBAR LAMINECTOMY/DECOMPRESSION MICRODISCECTOMY N/A 05/06/2021   Procedure: Microlumbar decompression L5-S1;  Surgeon: Orvan Blanch, MD;  Location: [facility];  Service: Orthopedics;  Laterality: N/A;  3 C-bed 90 mins   MELANOMA EXCISION WITH SENTINEL LYMPH NODE BIOPSY  2001   moh's procedure/  RIGHT FOREHEAD AND UPPER EYEBROW   MENISCUS DEBRIDEMENT Left 10/03/2023   Procedure: DEBRIDEMENT, MENISCUS, KNEE;  Surgeon: Orvan Blanch, MD;  Location: [facility];  Service: Orthopedics;  Laterality: Left;   RIGHT LATERAL PAROTIDECTOMY W/ NERVE DISSECTION / RIGHT MODIFIED RADICAL NECK DISSECTION SPARING SCM ELEVENTH NERVE AND INTERNAL JUGULAR VEIN  09-12-2006  DR DWIGHT BATES    DR DWIGHT BATES; "inside gland; lots of lymph nodes"   Family History  Problem Relation Age of Onset   Hypertension Mother    COPD Mother    Breast cancer Mother        breast   Dementia Mother    Heart disease Father    Cancer Father        Colorectal   Hyperlipidemia Father    Hypertension Father    Colon cancer Sister    Social History   Socioeconomic History   Marital status: Married    Spouse name: Not on file   Number of children: Not on file   Years of education: Not on file   Highest education level: Not on file  Occupational  History   Occupation: Conservation officer, nature at [employer]  Tobacco Use   Smoking status: Never   Smokeless tobacco: Never  Vaping Use   Vaping status: Never Used  Substance and Sexual Activity   Alcohol use: Not Currently    Comment: occasional- 1 drink per month   Drug use: Never   Sexual activity: Not on file  Other Topics Concern   Not on file  Social History Narrative   Right handed   Caffeine  3 cups daily   Lives one story home with basement 1 time weekly   Lives with husband and a dog   Social Drivers of Corporate investment banker Strain: Not on file  Food Insecurity: Not on file  Transportation Needs: Not on file  Physical Activity: Not on file  Stress: Not on file  Social Connections: Not on file  Intimate Partner Violence: Not on file    Review of Systems Constitutional: Patient denies any unintentional weight loss or change in strength lntegumentary: Patient denies any rashes or pruritus Cardiovascular: Patient denies chest pain or syncope Respiratory: Patient denies shortness of breath Gastrointestinal: ***Patient denies nausea, vomiting, constipation, or diarrhea ***As per HPI Musculoskeletal: Patient denies muscle cramps or weakness Neurologic: Patient denies convulsions or seizures Allergic/Immunologic: Patient denies recent allergic reaction(s) Hematologic/Lymphatic: Patient denies bleeding tendencies Endocrine: Patient  denies heat/cold intolerance  GU: As per HPI.  OBJECTIVE There were no vitals filed for this visit. There is no height or weight on file to calculate BMI.  Physical Examination Constitutional: No obvious distress; patient is non-toxic appearing  Cardiovascular: No visible lower extremity edema.  Respiratory: The patient does not have audible wheezing/stridor; respirations do not appear labored  Gastrointestinal: Abdomen non-distended Musculoskeletal: Normal ROM of UEs  Skin: No obvious rashes/open sores  Neurologic: CN 2-12 grossly intact Psychiatric: Answered questions appropriately with normal affect  Hematologic/Lymphatic/Immunologic: No obvious bruises or sites of spontaneous bleeding  UA: ***negative ***positive for *** leukocytes, *** blood, ***nitrites Urine microscopy: *** WBC/hpf, *** RBC/hpf, *** bacteria ***glucosuria (secondary to ***Jardiance ***Farxiga use) ***otherwise unremarkable  PVR: *** ml  ASSESSMENT No diagnosis found. ***  We agreed to plan for follow up in *** months / ***1 year or sooner if needed. Patient verbalized understanding of and agreement with current plan. All questions were answered.  PLAN Advised the following: 1. *** 2. ***No follow-ups on file.  No orders of the defined types were placed in this encounter.   It has been explained that the patient is to follow regularly with their PCP in addition to all other providers involved in their care and to follow instructions provided by these respective offices. Patient advised to contact urology clinic if any urologic-pertaining questions, concerns, new symptoms or problems arise in the interim period.  There are no Patient Instructions on file for this visit.  Electronically signed by:  Lauretta Ponto, FNP   11/23/23    10:14 AM

## 2023-11-27 ENCOUNTER — Ambulatory Visit: Admitting: Urology

## 2023-12-04 ENCOUNTER — Ambulatory Visit: Admitting: Urology

## 2023-12-04 DIAGNOSIS — N3010 Interstitial cystitis (chronic) without hematuria: Secondary | ICD-10-CM

## 2023-12-04 DIAGNOSIS — G8929 Other chronic pain: Secondary | ICD-10-CM

## 2023-12-04 DIAGNOSIS — N200 Calculus of kidney: Secondary | ICD-10-CM

## 2023-12-04 DIAGNOSIS — N952 Postmenopausal atrophic vaginitis: Secondary | ICD-10-CM

## 2023-12-07 ENCOUNTER — Telehealth: Payer: Self-pay | Admitting: Cardiovascular Disease

## 2023-12-07 MED ORDER — NITROGLYCERIN 0.4 MG SL SUBL: 0.4000 mg | SUBLINGUAL_TABLET | SUBLINGUAL | 8 refills | Status: AC | PRN

## 2023-12-07 NOTE — Telephone Encounter (Signed)
*  STAT* If patient is at the pharmacy, call can be transferred to refill team.   1. Which medications need to be refilled? (please list name of each medication and dose if known)   nitroGLYCERIN  (NITROSTAT ) 0.4 MG SL tablet   2. Would you like to learn more about the convenience, safety, & potential cost savings by using the [facility]?   3. Are you open to using the [facility] (Type [facility]. ).  4. Which pharmacy/location (including street and city if local pharmacy) is medication to be sent to?  [facility] - [place], [place] - [address]   5. Do they need a 30 day or 90 day supply?   Husband Arther Larve) stated patient is completely out of this medication.

## 2023-12-07 NOTE — Telephone Encounter (Signed)
 Pt's medication was sent to pt's pharmacy as requested. Confirmation received.

## 2023-12-31 NOTE — Progress Notes (Unsigned)
 Name: [name] DOB: 1960/11/10 MRN: [id number]  History of Present Illness: [name] is a 63 y.o. female who presents today for follow up visit at [facility].  Relevant History includes: 1. Interstitial cystitis with urinary frequency and urgency. 2. Chronic pelvic pain. 3. Chronic microscopic hematuria. 4. Vaginal atrophy. 5. Kidney stones (including uric acid stones). - Reports prior stone procedures; was previously followed at [facility].  - 08/01/2023: CT showed no GU stones, masses, or hydronephrosis; bladder unremarkable. Moderate fecal retention throughout the colon consistent with constipation. 6. Prior GU surgery: As a child she was having a lot of bedwetting and for reasons she cannot recall she had some kind of bilateral distal ureteral reconstruction.   At initial visit on 08/27/2023: - Denied bladder pain; reported chronic dysuria, frequency, and urgency. Voiding 10-12x/day. Rare nocturia.  - Started topical vaginal estrogen cream.    Today: She {Actions; denies-reports:120008} bladder pain***which is described as ***constant ***intermittent and {Mild/Moderate/Severe:20260} on average. She {Actions; denies-reports:120008} urinary ***frequency, ***nocturia, and ***urgency. Voiding ***x/day and ***x/night on average.  She {Actions; denies-reports:120008} ***dysuria, ***gross hematuria, ***straining to void, or ***sensations of incomplete emptying.  She {HAS HAS WNU:81165} been using vaginal estrogen cream at a frequency of *** time(s) per week. She {Actions; denies-reports:120008} vaginal pain, bleeding, abnormal discharge, itching, dryness.  Current treatment regimen includes: - Gabapentin  300 mg 3x/day - Nortriptyline  - topical vaginal estrogen cream   Previous treatments have included: - Hydroxyzine - Amitriptyline   Medications: Current Outpatient Medications  Medication Sig Dispense Refill   atorvastatin  (LIPITOR ) 40 MG tablet  Take 1 tablet (40 mg total) by mouth daily. (Patient taking differently: Take 40 mg by mouth at bedtime.) 90 tablet 3   calcium  carbonate (TUMS - DOSED IN MG ELEMENTAL CALCIUM ) 500 MG chewable tablet Chew 1,000 mg by mouth daily as needed for indigestion or heartburn.     carvedilol  (COREG ) 3.125 MG tablet Take 1 tablet (3.125 mg total) by mouth 2 (two) times daily with a meal. 180 tablet 3   Cholecalciferol  (D-3-5 PO) Take 1 tablet by mouth daily.     docusate sodium  (COLACE) 100 MG capsule Take 1 capsule (100 mg total) by mouth 2 (two) times daily as needed for mild constipation. 30 capsule 1   estradiol  (ESTRACE ) 0.1 MG/GM vaginal cream Discard plastic applicator. Insert a blueberry size amount (approximately 1 gram) of cream on fingertip inside vagina at bedtime every night for 1 week then every other night. For long term use. 30 g 3   famotidine  (PEPCID ) 20 MG tablet Take 40 mg by mouth at bedtime.     gabapentin  (NEURONTIN ) 300 MG capsule Take 900 mg by mouth at bedtime.     lisinopril  (ZESTRIL ) 2.5 MG tablet Take 1 tablet (2.5 mg total) by mouth every morning. 90 tablet 3   milk thistle 175 MG tablet Take 175 mg by mouth daily.     nitroGLYCERIN  (NITROSTAT ) 0.4 MG SL tablet Place 1 tablet (0.4 mg total) under the tongue every 5 (five) minutes as needed for chest pain. DISSOLVE ONE TABLET UNDER THE TONGUE EVERY 5 MINUTES AS NEEDED FOR CHEST PAIN.  DO NOT EXCEED A TOTAL OF 3 DOSES IN 15 MINUTES 25 tablet 8   nortriptyline  (PAMELOR ) 10 MG capsule Take 3 capsules (30 mg total) by mouth at bedtime. 270 capsule 3   oxyCODONE  (OXY IR/ROXICODONE ) 5 MG immediate release tablet Take 1 tablet (5 mg total) by mouth every 4 (four) hours as needed for  severe pain (pain score 7-10). 40 tablet 0   pantoprazole  (PROTONIX ) 40 MG tablet Take 1 tablet (40 mg total) by mouth daily. 30 tablet 0   vitamin B-12 (CYANOCOBALAMIN) 500 MCG tablet Take 500 mcg by mouth daily.     No current facility-administered  medications for this visit.    Allergies: Allergies  Allergen Reactions   Cephalexin Rash and Shortness Of Breath    Keflex   Lorazepam  Shortness Of Breath and Other (See Comments)    She may be very sensitive to benzo.    Stops breathing    Promethazine      Felt strange, knocked me out   Seasonal Ic [Octacosanol] Other (See Comments)    Past Medical History:  Diagnosis Date   Anxiety    Arthritis    back (12/30/2014)   Asthma    as a child, no problems as an adult, has an albuterol  inhaler   Cerebral cyst    per brain MRI 07-12-2019 unchanged 6mm cyst inferomedial right frontal lobe   Chronic headaches    Chronic systolic (congestive) heart failure (HCC)    followed by dr orlean   Complication of anesthesia    pt is very sensitive to benzodiazepines, pt stated almost died   Compression fracture of lumbar vertebra (HCC) 05/27/2020   per pt L3 and L4   DCM (dilated cardiomyopathy) (HCC)    w/ Takatsubo---  followed by dr delford---  stress-induced --- cardiac cath 12-30-2014 ef 30-35%, recovered per cardiac MRI 03-15-2015 ef  64%   Depression    Disorder of mastoid    per brain MRI 07-12-2019 persistant large mastoid effusion   Dysuria    Environmental and seasonal allergies    Frequency of urination    GERD (gastroesophageal reflux disease)    History of asthma    childhood   History of esophageal spasm    History of kidney stones    History of melanoma excision 2001   right supraorbital (right forehead and upper eyelid )  s/p  Moh's procedure w/ sln bx,  no recurrence per pt   History of non-ST elevation myocardial infarction (NSTEMI) 12/29/2014   per cath normal coronaries , ef 30-35%---  stress-- induced Takotsubo syndrome    History of palpitations 01/05/2015   STRESS INDUCED   History of parotid cancer 2008   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-2006 s/p  right lateral parotidectomy w/ nerve dissection , modified radical neck  dissection ;  completed  x35 fractions raditation 2008;  no recurrence per pt   Hyperlipidemia    Hypertension    Kidney cysts    Left ureteral calculus    Low back pain    Melanoma (HCC) 2001   face   Muscle spasm of back    Myocardial infarction (HCC) 2016   Pneumonia    as a child   PONV (postoperative nausea and vomiting)    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   Squamous cell carcinoma of skin 02/22/2021   in istu- right forehead ([id number])   Takotsubo syndrome 12/29/2014   cardiologist---  dr orlean--- dx NSTEMI -- stress-induced w/ DCM--  per cath 12-30-2014 normal coronaries , ef 30-35%,  recovered ef per cardiac MRI  ef 64%   Urgency of urination    Wears glasses    Past Surgical History:  Procedure Laterality Date   BACK SURGERY     BREAST BIOPSY Bilateral early 2000's   CARDIAC CATHETERIZATION  2006 (APPROX)  [place]   NORMAL   CARDIAC CATHETERIZATION  12/30/2014   CARDIAC CATHETERIZATION N/A 12/30/2014   Procedure: Left Heart Cath and Coronary Angiography;  Surgeon: Deatrice DELENA Cage, MD;  Location: [facility];  Service: Cardiovascular;  Laterality: N/A;   CARDIOVASCULAR STRESS TEST  03-21-2012  DR DELFORD   NORMAL NUCLEAR STUDY/  NO ISCHEMIA/  EF 63%   COLONOSCOPY  10/2020   COLONOSCOPY WITH PROPOFOL   05/29/2017   CYSTOSCOPY W/ URETERAL STENT PLACEMENT Left 03/26/2013   Procedure: CYSTOSCOPY WITH RETROGRADE PYELOGRAM ;  Surgeon: Toribio Neysa Repine, MD;  Location: [facility];  Service: Urology;  Laterality: Left;   CYSTOSCOPY WITH RETROGRADE PYELOGRAM, URETEROSCOPY AND STENT PLACEMENT Bilateral 03/19/2013   Procedure: CYSTOSCOPY WITH RETROGRADE PYELOGRAM, BILATERAL URETEROSCOPY AND STENT PLACEMENT LEFT URETER,BILATERAL STONE EXTRACTION , HOLMIUM LASER LEFT URETER;  Surgeon: Toribio Neysa Repine, MD;  Location: [facility];  Service: Urology;  Laterality: Bilateral;   CYSTOSCOPY WITH STENT PLACEMENT Left 03/26/2013   Procedure:  CYSTOSCOPY WITH STENT PLACEMENT;  Surgeon: Toribio Neysa Repine, MD;  Location: [facility];  Service: Urology;  Laterality: Left;   CYSTOSCOPY/URETEROSCOPY/HOLMIUM LASER/STENT PLACEMENT Left 07/07/2020   Procedure: CYSTOSCOPY LEFT URETEROSCOPY/HOLMIUM LASER/STENT PLACEMENT;  Surgeon: Carolee Sherwood JONETTA DOUGLAS, MD;  Location: [facility];  Service: Urology;  Laterality: Left;   DIAGNOSTIC LAPAROSCOPY  04/12/2009   ESOPHAGOGASTRODUODENOSCOPY (EGD) WITH PROPOFOL  N/A 02/10/2016   Procedure: ESOPHAGOGASTRODUODENOSCOPY (EGD) WITH PROPOFOL ;  Surgeon: Lamar Bunk, MD;  Location: [facility];  Service: Endoscopy;  Laterality: N/A;   KIDNEY SURGERY  1966   BILATERAL URETER'S DILATATION   KNEE ARTHROSCOPY WITH MEDIAL MENISECTOMY Left 10/03/2023   Procedure: ARTHROSCOPY, KNEE, WITH MEDIAL MENISCECTOMY;  Surgeon: Duwayne Purchase, MD;  Location: [facility];  Service: Orthopedics;  Laterality: Left;  Left knee arthroscopy, partial medial meniscectomy and debridement   LUMBAR LAMINECTOMY/DECOMPRESSION MICRODISCECTOMY  05/18/2011   Procedure: LUMBAR LAMINECTOMY/DECOMPRESSION MICRODISCECTOMY;  Surgeon: Purchase JAYSON Duwayne;  Location: [facility];  Service: Orthopedics;  Laterality: Right;  Decompression Lumbar 4-Lumbar 5  Right    (xray)    LUMBAR LAMINECTOMY/DECOMPRESSION MICRODISCECTOMY N/A 05/06/2021   Procedure: Microlumbar decompression L5-S1;  Surgeon: Duwayne Purchase, MD;  Location: [facility];  Service: Orthopedics;  Laterality: N/A;  3 C-bed 90 mins   MELANOMA EXCISION WITH SENTINEL LYMPH NODE BIOPSY  2001   moh's procedure/  RIGHT FOREHEAD AND UPPER EYEBROW   MENISCUS DEBRIDEMENT Left 10/03/2023   Procedure: DEBRIDEMENT, MENISCUS, KNEE;  Surgeon: Duwayne Purchase, MD;  Location: [facility];  Service: Orthopedics;  Laterality: Left;   RIGHT LATERAL PAROTIDECTOMY W/ NERVE DISSECTION / RIGHT MODIFIED RADICAL NECK DISSECTION SPARING SCM ELEVENTH NERVE AND INTERNAL JUGULAR VEIN  09-12-2006  DR DWIGHT BATES    DR DWIGHT BATES; inside gland; lots of lymph nodes   Family History  Problem Relation Age of Onset   Hypertension Mother    COPD Mother    Breast cancer Mother        breast   Dementia Mother    Heart disease Father    Cancer Father        Colorectal   Hyperlipidemia Father    Hypertension Father    Colon cancer Sister    Social History   Socioeconomic History   Marital status: Married    Spouse name: Not on file   Number of children: Not on file   Years of education: Not on file   Highest education level: Not on file  Occupational  History   Occupation: Conservation officer, nature at [employer]  Tobacco Use   Smoking status: Never   Smokeless tobacco: Never  Vaping Use   Vaping status: Never Used  Substance and Sexual Activity   Alcohol use: Not Currently    Comment: occasional- 1 drink per month   Drug use: Never   Sexual activity: Not on file  Other Topics Concern   Not on file  Social History Narrative   Right handed   Caffeine  3 cups daily   Lives one story home with basement 1 time weekly   Lives with husband and a dog   Social Drivers of Corporate investment banker Strain: Not on file  Food Insecurity: Not on file  Transportation Needs: Not on file  Physical Activity: Not on file  Stress: Not on file  Social Connections: Not on file  Intimate Partner Violence: Not on file    Review of Systems Constitutional: Patient denies any unintentional weight loss or change in strength lntegumentary: Patient denies any rashes or pruritus Cardiovascular: Patient denies chest pain or syncope Respiratory: Patient denies shortness of breath Gastrointestinal: ***Patient denies nausea, vomiting, constipation, or diarrhea ***As per HPI Musculoskeletal: Patient denies muscle cramps or weakness Neurologic: Patient denies convulsions or seizures Allergic/Immunologic: Patient denies recent allergic reaction(s) Hematologic/Lymphatic: Patient denies bleeding tendencies Endocrine: Patient  denies heat/cold intolerance  GU: As per HPI.  OBJECTIVE There were no vitals filed for this visit. There is no height or weight on file to calculate BMI.  Physical Examination Constitutional: No obvious distress; patient is non-toxic appearing  Cardiovascular: No visible lower extremity edema.  Respiratory: The patient does not have audible wheezing/stridor; respirations do not appear labored  Gastrointestinal: Abdomen non-distended Musculoskeletal: Normal ROM of UEs  Skin: No obvious rashes/open sores  Neurologic: CN 2-12 grossly intact Psychiatric: Answered questions appropriately with normal affect  Hematologic/Lymphatic/Immunologic: No obvious bruises or sites of spontaneous bleeding  UA: ***negative ***positive for *** leukocytes, *** blood, ***nitrites Urine microscopy: *** WBC/hpf, *** RBC/hpf, *** bacteria ***glucosuria (secondary to ***Jardiance ***Farxiga use) ***otherwise unremarkable  PVR: *** ml  ASSESSMENT Interstitial cystitis  Recurrent nephrolithiasis  Atrophic vaginitis  Chronic pelvic pain in female ***  We agreed to plan for follow up in *** months / ***1 year or sooner if needed. Patient verbalized understanding of and agreement with current plan. All questions were answered.  PLAN Advised the following: 1. *** 2. ***No follow-ups on file.  No orders of the defined types were placed in this encounter.   It has been explained that the patient is to follow regularly with their PCP in addition to all other providers involved in their care and to follow instructions provided by these respective offices. Patient advised to contact urology clinic if any urologic-pertaining questions, concerns, new symptoms or problems arise in the interim period.  There are no Patient Instructions on file for this visit.  Electronically signed by:  Lauraine JAYSON Oz, FNP   12/31/23    10:20 AM

## 2024-01-01 ENCOUNTER — Ambulatory Visit (INDEPENDENT_AMBULATORY_CARE_PROVIDER_SITE_OTHER): Admitting: Urology

## 2024-01-01 ENCOUNTER — Encounter: Payer: Self-pay | Admitting: Urology

## 2024-01-01 VITALS — BP 137/83 | HR 83

## 2024-01-01 DIAGNOSIS — R350 Frequency of micturition: Secondary | ICD-10-CM

## 2024-01-01 DIAGNOSIS — N200 Calculus of kidney: Secondary | ICD-10-CM | POA: Diagnosis not present

## 2024-01-01 DIAGNOSIS — R102 Pelvic and perineal pain: Secondary | ICD-10-CM | POA: Diagnosis not present

## 2024-01-01 DIAGNOSIS — R3913 Splitting of urinary stream: Secondary | ICD-10-CM

## 2024-01-01 DIAGNOSIS — N3010 Interstitial cystitis (chronic) without hematuria: Secondary | ICD-10-CM

## 2024-01-01 DIAGNOSIS — N952 Postmenopausal atrophic vaginitis: Secondary | ICD-10-CM | POA: Diagnosis not present

## 2024-01-01 DIAGNOSIS — R3915 Urgency of urination: Secondary | ICD-10-CM

## 2024-01-01 DIAGNOSIS — G8929 Other chronic pain: Secondary | ICD-10-CM

## 2024-01-01 DIAGNOSIS — R300 Dysuria: Secondary | ICD-10-CM

## 2024-01-01 DIAGNOSIS — R8290 Unspecified abnormal findings in urine: Secondary | ICD-10-CM

## 2024-01-01 LAB — BLADDER SCAN AMB NON-IMAGING: Scan Result: 0

## 2024-01-01 NOTE — Patient Instructions (Signed)
 UTI prevention / management:  UTI symptoms may include:  - Pain / burning / discomfort when urinating - Recent increase in urinary urgency (how quickly you feel like you need to rush to the bathroom) - Recent increase in urinary frequency (how often you are urinating) - Fever - Acute mental status change / confusion - Fatigue / Feeling tired - Weakness - Note: Urine color, clarity, and odor are not considered to be clinically significant indicators of UTI and do not warrant urine testing unless patient is also experiencing UTI symptoms such as those listed above.  Difference between Urinalysis (urine dipstick test) and Urine culture / Why urine culture often needed to determine appropriate diagnosis and treatment of urologic symptoms: > Urinalysis (urine dipstick test): A quick office test used as an indicator to determine whether or not further testing is necessary (such as a urine culture, urine microscopy, etc.) The urinalysis cannot differentiate a true bacterial UTI or give a definitive diagnosis for the findings.  > Urine culture: May be performed based on the findings of a urinalysis to evaluate for UTI. Grows out on a petri dish for 48-72 hours. Provides important information about: whether or not bacterial growth is present and if so: what the predominant bacteria is which antibiotics will work best against that bacteria That information is important so that we can diagnose and treat patients appropriately as there are other conditions which may mimic UTls which must not be missed (such as cancer, interstitial cystitis, stones, etc.). Assists us  with antibiotic stewardship to minimize patient's risk for developing antibiotic resistance (getting to a point where no antibiotics work anymore).  Options when UTI symptoms occur: 1. Call [facility] and request to speak with triage nurse (phone # [phone], select option 3). In accordance with clinic guidelines  the nurse will determine next steps based on patient-reported symptoms, which may include: same-day lab visit to provide urine specimen, recommendation to schedule Urology office visit appointment for further evaluation, recommendation to proceed to ER, etc. 2. Call your Primary Care Provider (PCP) office to request urgent / same-day visit. Be sure to request for urine culture to be ordered and have results faxed to Urology (fax # [phone]).  3. Go to urgent care. Be sure to request for urine culture to be ordered and have results faxed to Urology (fax # [phone]).   For bladder pain/ burning with urination: - Can take over-the-counter Pyridium  (phenazopyridine ; commonly known under the AZO brand) for a few days as needed. Limit use to no more than 3 days consecutively due to risk for methemoglobinemia, liver function issues, and bone health damage with long term use of Pyridium . - Alternative: Prescription urinary analgesics (such as Uribel, Urogesic blue, Urelle, Uro-MP). Often expensive / poorly covered by insurance unfortunately.  Options / recommendations for UTI prevention: - Topical vaginal estrogen for vaginal atrophy (aka Genitourinary Syndrome of Menopause (GSM)). - Adequate daily fluid intake to flush out the urinary tract. - Go to the bathroom to urinate every 4-6 hours while awake to minimize urinary stasis / bacterial overgrowth in the bladder. - Proanthocyanidin (PAC) supplement 36 mg daily; must be soluble (insoluble form of PAC will be ineffective). Recommended brand: Ellura. This is an over-the-counter supplement (often must be found/ purchased online) supplement derived from cranberries with concentrated active component: Proanthocyanidin (PAC) 36 mg daily. Decreases bacterial adherence to bladder lining.  - D-mannose powder (2 grams daily). This is an over-the-counter supplement which decreases bacterial adherence to bladder lining (it  is a sugar that inhibits bacterial  adherence to urothelial cells by binding to the pili of enteric bacteria). Take as per manufacturer recommendation. Can be used as an alternative or in addition to the concentrated cranberry supplement.  - Vitamin C supplement to acidify urine to minimize bacterial growth.  - Probiotic to maintain healthy vaginal microbiome to suppress bacteria at urethral opening. Brand recommendations: Estill Hemming (includes probiotic & D-mannose ), Feminine Balance (highest concentration of lactobacillus) or Hyperbiotic Pro 15.  Note for patients with diabetes:  - Be aware that D-mannose contains sugar.  Note for patients with interstitial cystitis (IC):  - Patients with IC should typically avoid cranberry/ PAC supplements and Vitamin C supplements due to their acidity, which may exacerbate IC-related bladder pain. - Symptoms of true bacterial UTI can overlap / mimic symptoms of an IC flare up. Antibiotic use is NOT indicated for IC flare ups. Urine culture needed prior to antibiotic treatment for IC patients. The goal is to minimize your risk for developing antibiotic-resistant bacteria.    Vaginal atrophy I Genitourinary syndrome of menopause (GSM):  What it is: Changes in the vaginal environment (including the vulva and urethra) including: Thinning of the epithelium (skin/ mucosa surface) Can contribute to urinary urgency and frequency Can contribute to dryness, itching, irritation of the vulvar and vaginal tissue Can contribute to pain with intercourse Can contribute to physical changes of the labia, vulva, and vagina such as: Narrowing of the vaginal opening Decreased vaginal length Loss of labial architecture Labial adhesions Pale color of vulvovaginal tissue Loss of pubic hair Allows bacteria to become adherent Results in increased risk for urinary tract infection (UTI) due to bacterial overgrowth and migration up the urethra into the bladder Change in vaginal pH (acid/ base balance) Allows for  alteration / disruption of the normal bacterial flora / microbiome Results in increased risk for urinary tract infection (UTI) due to bacterial overgrowth  Treatment options: Over-the-counter lubricants (see list below). Prescription vaginal estrogen replacement. Options: Topical vaginal estrogen (estradiol) cream: (Estrace, Premarin, compounded) Apply as directed Estring vaginal ring Exchanged every 3 months (either at home or in office by provider) Vagifem vaginal tablet Inserted nightly for 2 weeks then twice a week (long term) lntrarosa vaginal suppository Vaginal DHEA: converts to estrogen in vaginal tissue without systemic effect Inserted nightly (long term) 3. Vaginal laser therapy (Mona Edwina Gram touch) Performed in 3 treatments each 6 weeks apart (available in our [place] office). Can feel like a sunburn for 3-4 days after each treatment until new skin heals in. Usually not covered by insurance. Estimated cost is $1500 for all 3 sessions.  FYI regarding prescription vaginal estrogen treatment options: - All topical vaginal estrogen replacement options are equivalent in terms of efficacy. - Topical vaginal estrogen replacement will take about 3 months to be effective. - OK to have sex with any of the topical vaginal estrogen replacement options. - Topical vaginal estrogen replacement may sting/burn initially due to severe dryness, which will improve with ongoing treatment. - Studies have demonstrated negligible systemic absorption of low-dose intravaginal estrogen cream therefore the risk for cancer development or recurrence with this medication is minimal.  Genitourinary Syndrome of Menopause: AUA/SUFU/AUGS Guideline (2025) [place].at  Topical vaginal estrogen cream safe to use with breast cancer  history WomenInsider.com.ee  Topical vaginal estrogen cream safe to use with blood clot history [email or web address]   Lubricants and Moisturizers for Treating Genitourinary Syndrome of Menopause and Vulvovaginal Atrophy Treatment Comments I Available Products   Lubricants   Water -based  Ingredients: Deionized water , glycerin , propylene glycol; latex safe; rare irritation; dry out with extended sexual activity Astroglide, Good Clean Love, K-Y Jelly, Natural, Organic, Pink, Sliquid, Sylk, Yes    Oil Based Ingredients: avocado, olive, peanut, corn; latex safe; can be used with silicone products; staining; safe (unless peanut allergy); non-irritating Coconut oil, vegetable oil, vitamin E oil  Silicone-Based Ingredients: Silicone polymers; staining; typically nonirritating, long lasting; waterproof; should not be used with silicone dilators, sexual toys, or gynecologic products Astroglide X, Oceanus Ultra Pure, Pink Silicone, Pjur Eros, Replens Silky Smooth, Silicone Premium JO, SKYN, Uberlube, [employer] Based Minimize harm to sperm motility; designed for couples trying to conceive:  Astroglide TTC, Conceive Plus, PreSeed, Yes Baby  Fertility Friendly Minimize harm to sperm motility; designed for couples trying to conceive:  Astroglide, TTC, Conceive Plus, PreSeed, Yes Baby  Vaginal Moisturizers   Vaginal Moisturizers For maintenance use 1 to 3 times weekly; can benefit women with dryness, chafing with AOL, and recurrent vaginal infections irrespective of sexual activity timing Balance Active Menopause Vaginal Moisturizing Lubricant, Canesintima Intimate Moisturizer, Replens, Rephresh, Sylk Natural Intimate Moisturizer, Yes Vaginal  Moisturizer  Hybrids Properties of both water  and silicone-based products (combination of a vaginal lubricant and moisturizer); Non-irritating; good option for women with allergies and sensitivities Lubrigyn, Luvena  Suppositories Hyaluronic acid to retain moisture Revaree  Vulvar Soothing Creams/Oils    Medicated Creams Pain and burn relief; Ingredients: 4% Lidocaine , Aloe Vera gel Releveum ([place]ert Titanic)  Non-Medicated Creams For anti-itch and moisture/maintenance; Ingredients: Coconut oil, Avocado oil, Shea Butter, Olive oil, Vitamin E Vajuvenate, Vmagic  Oils for moisture/maintenance: Coconut oil, Vitamin E oil, Emu oil

## 2024-01-02 LAB — URINALYSIS, ROUTINE W REFLEX MICROSCOPIC
Bilirubin, UA: NEGATIVE
Glucose, UA: NEGATIVE
Ketones, UA: NEGATIVE
Nitrite, UA: NEGATIVE
Protein,UA: NEGATIVE
Specific Gravity, UA: 1.03 (ref 1.005–1.030)
Urobilinogen, Ur: 0.2 mg/dL (ref 0.2–1.0)
pH, UA: 6 (ref 5.0–7.5)

## 2024-01-02 LAB — MICROSCOPIC EXAMINATION: Bacteria, UA: NONE SEEN

## 2024-01-03 ENCOUNTER — Ambulatory Visit: Payer: Self-pay | Admitting: Urology

## 2024-01-03 LAB — URINE CULTURE

## 2024-01-03 NOTE — Telephone Encounter (Signed)
 Tried calling pt and was told pt was unavailable and left phone number for pt to return call.

## 2024-01-03 NOTE — Telephone Encounter (Signed)
-----   Message from Lauraine JAYSON Oz sent at 01/03/2024  8:37 AM EDT ----- Please notify patient: Negative urine culture, no antibiotic needed at this time. Thanks.  ----- Message ----- From: Rebecka Memos Lab Results In Sent: 01/02/2024   5:36 AM EDT To: Sarah C Larocco, FNP

## 2024-01-04 NOTE — Telephone Encounter (Signed)
 Tried calling pt with no answer, left phone number for pt to return call. Letter sent out

## 2024-01-07 NOTE — Telephone Encounter (Signed)
Pt is made aware and voiced understanding

## 2024-01-09 ENCOUNTER — Encounter: Payer: Self-pay | Admitting: Family Medicine

## 2024-01-09 ENCOUNTER — Ambulatory Visit (INDEPENDENT_AMBULATORY_CARE_PROVIDER_SITE_OTHER)

## 2024-01-09 ENCOUNTER — Ambulatory Visit (INDEPENDENT_AMBULATORY_CARE_PROVIDER_SITE_OTHER): Admitting: Family Medicine

## 2024-01-09 VITALS — BP 128/82 | HR 75 | Ht 64.0 in | Wt 191.0 lb

## 2024-01-09 DIAGNOSIS — Z780 Asymptomatic menopausal state: Secondary | ICD-10-CM

## 2024-01-09 DIAGNOSIS — Z0001 Encounter for general adult medical examination with abnormal findings: Secondary | ICD-10-CM | POA: Diagnosis not present

## 2024-01-09 DIAGNOSIS — R5383 Other fatigue: Secondary | ICD-10-CM

## 2024-01-09 DIAGNOSIS — Z0000 Encounter for general adult medical examination without abnormal findings: Secondary | ICD-10-CM

## 2024-01-09 NOTE — Progress Notes (Signed)
 Subjective:    [name] is a 63 y.o. female who presents for a Welcome to Medicare exam.   Cardiac Risk Factors include: obesity (BMI >30kg/m2);advanced age (>58men, >62 women)        Objective:    Today's Vitals   01/09/24 1258  BP: 128/82  Pulse: 75  SpO2: 96%  Weight: 191 lb (86.6 kg)  Height: 5' 4 (1.626 m)  Body mass index is 32.79 kg/m.  Medications Outpatient Encounter Medications as of 01/09/2024  Medication Sig   atorvastatin  (LIPITOR ) 40 MG tablet Take 1 tablet (40 mg total) by mouth daily. (Patient taking differently: Take 40 mg by mouth at bedtime.)   calcium  carbonate (TUMS - DOSED IN MG ELEMENTAL CALCIUM ) 500 MG chewable tablet Chew 1,000 mg by mouth daily as needed for indigestion or heartburn.   carvedilol  (COREG ) 3.125 MG tablet Take 1 tablet (3.125 mg total) by mouth 2 (two) times daily with a meal.   Cholecalciferol  (D-3-5 PO) Take 1 tablet by mouth daily.   docusate sodium  (COLACE) 100 MG capsule Take 1 capsule (100 mg total) by mouth 2 (two) times daily as needed for mild constipation.   estradiol  (ESTRACE ) 0.1 MG/GM vaginal cream Discard plastic applicator. Insert a blueberry size amount (approximately 1 gram) of cream on fingertip inside vagina at bedtime every night for 1 week then every other night. For long term use.   lisinopril  (ZESTRIL ) 2.5 MG tablet Take 1 tablet (2.5 mg total) by mouth every morning.   milk thistle 175 MG tablet Take 175 mg by mouth daily.   nitroGLYCERIN  (NITROSTAT ) 0.4 MG SL tablet Place 1 tablet (0.4 mg total) under the tongue every 5 (five) minutes as needed for chest pain. DISSOLVE ONE TABLET UNDER THE TONGUE EVERY 5 MINUTES AS NEEDED FOR CHEST PAIN.  DO NOT EXCEED A TOTAL OF 3 DOSES IN 15 MINUTES   nortriptyline  (PAMELOR ) 10 MG capsule Take 3 capsules (30 mg total) by mouth at bedtime.   oxyCODONE  (OXY IR/ROXICODONE ) 5 MG immediate release tablet Take 1 tablet (5 mg total) by mouth every 4 (four) hours as needed for  severe pain (pain score 7-10).   vitamin B-12 (CYANOCOBALAMIN) 500 MCG tablet Take 500 mcg by mouth daily.   [DISCONTINUED] famotidine  (PEPCID ) 20 MG tablet Take 40 mg by mouth at bedtime. (Patient not taking: Reported on 01/01/2024)   [DISCONTINUED] gabapentin  (NEURONTIN ) 300 MG capsule Take 900 mg by mouth at bedtime. (Patient not taking: Reported on 01/01/2024)   [DISCONTINUED] pantoprazole  (PROTONIX ) 40 MG tablet Take 1 tablet (40 mg total) by mouth daily. (Patient not taking: Reported on 01/01/2024)   No facility-administered encounter medications on file as of 01/09/2024.     History: Past Medical History:  Diagnosis Date   Anxiety    Arthritis    back (12/30/2014)   Asthma    as a child, no problems as an adult, has an albuterol  inhaler   Cerebral cyst    per brain MRI 07-12-2019 unchanged 6mm cyst inferomedial right frontal lobe   Chronic headaches    Chronic systolic (congestive) heart failure (HCC)    followed by dr orlean   Complication of anesthesia    pt is very sensitive to benzodiazepines, pt stated almost died   Compression fracture of lumbar vertebra (HCC) 05/27/2020   per pt L3 and L4   DCM (dilated cardiomyopathy) (HCC)    w/ Takatsubo---  followed by dr delford---  stress-induced --- cardiac cath 12-30-2014 ef 30-35%, recovered per  cardiac MRI 03-15-2015 ef  64%   Depression    Disorder of mastoid    per brain MRI 07-12-2019 persistant large mastoid effusion   Dysuria    Environmental and seasonal allergies    Frequency of urination    GERD (gastroesophageal reflux disease)    History of asthma    childhood   History of esophageal spasm    History of kidney stones    History of melanoma excision 2001   right supraorbital (right forehead and upper eyelid )  s/p  Moh's procedure w/ sln bx,  no recurrence per pt   History of non-ST elevation myocardial infarction (NSTEMI) 12/29/2014   per cath normal coronaries , ef 30-35%---  stress-- induced Takotsubo syndrome     History of palpitations 01/05/2015   STRESS INDUCED   History of parotid cancer 2008   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-2006 s/p  right lateral parotidectomy w/ nerve dissection , modified radical neck dissection ;  completed  x35 fractions raditation 2008;  no recurrence per pt   Hyperlipidemia    Hypertension    Kidney cysts    Left ureteral calculus    Low back pain    Melanoma (HCC) 2001   face   Muscle spasm of back    Myocardial infarction (HCC) 2016   Pneumonia    as a child   PONV (postoperative nausea and vomiting)    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   Squamous cell carcinoma of skin 02/22/2021   in istu- right forehead ([id number])   Takotsubo syndrome 12/29/2014   cardiologist---  dr orlean--- dx NSTEMI -- stress-induced w/ DCM--  per cath 12-30-2014 normal coronaries , ef 30-35%,  recovered ef per cardiac MRI  ef 64%   Urgency of urination    Wears glasses    Past Surgical History:  Procedure Laterality Date   BACK SURGERY     BREAST BIOPSY Bilateral early 2000's   CARDIAC CATHETERIZATION  2006 (APPROX)  [place]   NORMAL   CARDIAC CATHETERIZATION  12/30/2014   CARDIAC CATHETERIZATION N/A 12/30/2014   Procedure: Left Heart Cath and Coronary Angiography;  Surgeon: Deatrice DELENA Cage, MD;  Location: [facility];  Service: Cardiovascular;  Laterality: N/A;   CARDIOVASCULAR STRESS TEST  03-21-2012  DR DELFORD   NORMAL NUCLEAR STUDY/  NO ISCHEMIA/  EF 63%   COLONOSCOPY  10/2020   COLONOSCOPY WITH PROPOFOL   05/29/2017   CYSTOSCOPY W/ URETERAL STENT PLACEMENT Left 03/26/2013   Procedure: CYSTOSCOPY WITH RETROGRADE PYELOGRAM ;  Surgeon: Toribio Neysa Repine, MD;  Location: [facility];  Service: Urology;  Laterality: Left;   CYSTOSCOPY WITH RETROGRADE PYELOGRAM, URETEROSCOPY AND STENT PLACEMENT Bilateral 03/19/2013   Procedure: CYSTOSCOPY WITH RETROGRADE PYELOGRAM, BILATERAL URETEROSCOPY AND STENT  PLACEMENT LEFT URETER,BILATERAL STONE EXTRACTION , HOLMIUM LASER LEFT URETER;  Surgeon: Toribio Neysa Repine, MD;  Location: [facility];  Service: Urology;  Laterality: Bilateral;   CYSTOSCOPY WITH STENT PLACEMENT Left 03/26/2013   Procedure: CYSTOSCOPY WITH STENT PLACEMENT;  Surgeon: Toribio Neysa Repine, MD;  Location: [facility];  Service: Urology;  Laterality: Left;   CYSTOSCOPY/URETEROSCOPY/HOLMIUM LASER/STENT PLACEMENT Left 07/07/2020   Procedure: CYSTOSCOPY LEFT URETEROSCOPY/HOLMIUM LASER/STENT PLACEMENT;  Surgeon: Carolee Sherwood JONETTA DOUGLAS, MD;  Location: [facility];  Service: Urology;  Laterality: Left;   DIAGNOSTIC LAPAROSCOPY  04/12/2009   ESOPHAGOGASTRODUODENOSCOPY (EGD) WITH PROPOFOL  N/A 02/10/2016   Procedure: ESOPHAGOGASTRODUODENOSCOPY (EGD) WITH PROPOFOL ;  Surgeon:  Lamar Bunk, MD;  Location: [facility];  Service: Endoscopy;  Laterality: N/A;   KIDNEY SURGERY  1966   BILATERAL URETER'S DILATATION   KNEE ARTHROSCOPY WITH MEDIAL MENISECTOMY Left 10/03/2023   Procedure: ARTHROSCOPY, KNEE, WITH MEDIAL MENISCECTOMY;  Surgeon: Duwayne Purchase, MD;  Location: [facility];  Service: Orthopedics;  Laterality: Left;  Left knee arthroscopy, partial medial meniscectomy and debridement   LUMBAR LAMINECTOMY/DECOMPRESSION MICRODISCECTOMY  05/18/2011   Procedure: LUMBAR LAMINECTOMY/DECOMPRESSION MICRODISCECTOMY;  Surgeon: Purchase JAYSON Duwayne;  Location: [facility];  Service: Orthopedics;  Laterality: Right;  Decompression Lumbar 4-Lumbar 5  Right    (xray)    LUMBAR LAMINECTOMY/DECOMPRESSION MICRODISCECTOMY N/A 05/06/2021   Procedure: Microlumbar decompression L5-S1;  Surgeon: Duwayne Purchase, MD;  Location: [facility];  Service: Orthopedics;  Laterality: N/A;  3 C-bed 90 mins   MELANOMA EXCISION WITH SENTINEL LYMPH NODE BIOPSY  2001   moh's procedure/  RIGHT FOREHEAD AND UPPER EYEBROW   MENISCUS DEBRIDEMENT Left 10/03/2023   Procedure: DEBRIDEMENT, MENISCUS, KNEE;  Surgeon: Duwayne Purchase,  MD;  Location: [facility];  Service: Orthopedics;  Laterality: Left;   RIGHT LATERAL PAROTIDECTOMY W/ NERVE DISSECTION / RIGHT MODIFIED RADICAL NECK DISSECTION SPARING SCM ELEVENTH NERVE AND INTERNAL JUGULAR VEIN  09-12-2006  DR DWIGHT BATES   DR DWIGHT BATES; inside gland; lots of lymph nodes    Family History  Problem Relation Age of Onset   Hypertension Mother    COPD Mother    Breast cancer Mother        breast   Dementia Mother    Heart disease Father    Cancer Father        Colorectal   Hyperlipidemia Father    Hypertension Father    Colon cancer Sister    Social History   Occupational History   Occupation: Conservation officer, nature at [employer]  Tobacco Use   Smoking status: Never   Smokeless tobacco: Never  Vaping Use   Vaping status: Never Used  Substance and Sexual Activity   Alcohol use: Not Currently    Comment: occasional- 1 drink per month   Drug use: Never   Sexual activity: Not on file    Tobacco Counseling Counseling given: Not Answered   Immunizations and Health Maintenance Immunization History  Administered Date(s) Administered    sv, Bivalent, Protein Subunit Rsvpref,pf (Abrysvo) 06/16/2022   Hepatitis B 07/28/2010, 09/02/2010, 11/18/2010   Influenza,inj,Quad PF,6+ Mos 03/27/2015, 03/10/2016, 03/20/2017, 03/18/2018, 02/19/2019, 04/16/2019   Influenza,trivalent, recombinat, inj, PF 03/25/2023   Influenza-Unspecified 03/11/2014, 04/02/2020   Moderna Covid-19 Fall Seasonal Vaccine 50yrs & older 03/25/2023   Moderna Sars-Covid-2 Vaccination 08/15/2019, 09/12/2019   PNEUMOCOCCAL CONJUGATE-20 10/12/2023   Pneumococcal Conjugate-13 04/27/2014   Tdap 07/15/2015   Zoster Recombinant(Shingrix) 11/24/2019, 02/03/2020   Zoster, Live 04/27/2014   Health Maintenance Due  Topic Date Due   DEXA SCAN  09/03/2016   COVID-19 Vaccine (4 - Moderna risk 2024-25 season) 09/23/2023    Activities of Daily Living    01/09/2024    1:26 PM 09/26/2023    1:01 PM  In your present state  of health, do you have any difficulty performing the following activities:  Hearing? 1   Comment radiation and cancer treatment. Insurance will not cover   Vision? 0   Comment wears glasses   Difficulty concentrating or making decisions? 0   Walking or climbing stairs? 0   Dressing or bathing? 0   Doing errands, shopping? 0 0  Preparing Food and eating ? N   Using the Toilet? N  In the past six months, have you accidently leaked urine? N   Do you have problems with loss of bowel control? N   Managing your Medications? N   Managing your Finances? N   Housekeeping or managing your Housekeeping? N     Physical Exam   Physical Exam (optional), or other factors deemed appropriate based on the beneficiary's medical and social history and current clinical standards.   Advanced Directives: Does Patient Have a Medical Advance Directive?: Yes Type of Advance Directive: Healthcare Power of Attorney, Living will     Assessment:    This is a routine wellness examination for this patient .   Vision/Hearing screen No results found.   Goals   None     Depression Screen    10/12/2023   10:08 AM 05/25/2023    8:09 AM 02/20/2023   10:48 AM 02/08/2023    2:40 PM  PHQ 2/9 Scores  PHQ - 2 Score 0 0 0 0  PHQ- 9 Score  1 1      Fall Risk    01/09/2024    1:32 PM  Fall Risk   Falls in the past year? 0  Number falls in past yr: 0  Injury with Fall? 0  Risk for fall due to : No Fall Risks  Follow up Falls evaluation completed    Cognitive Function:        01/09/2024    1:34 PM  6CIT Screen  What Year? 0 points  What time? 0 points  Count back from 20 0 points  Months in reverse 0 points  Repeat phrase 0 points    Patient Care Team: Makalia Bare, Fonda LABOR, MD as PCP - General (Family Medicine) Delford Maude BROCKS, MD as PCP - Cardiology (Cardiology) Livingston Rigg, MD (Inactive) as Consulting Physician (Dermatology)     Plan:    Problem List Items Addressed This Visit    None Visit Diagnoses       Encounter for Medicare annual wellness exam    -  Primary     Postmenopausal       Relevant Orders   DG [facility] DEXA     Fatigue, unspecified type       Relevant Orders   Anemia Profile B   TSH        I have personally reviewed and noted the following in the patient's chart:   Medical and social history Use of alcohol, tobacco or illicit drugs  Current medications and supplements including opioid prescriptions.  Functional ability and status Nutritional status Physical activity Advanced directives List of other physicians Hospitalizations, surgeries, and ER visits in previous 12 months Vitals Screenings to include cognitive, depression, and falls Referrals and appointments  In addition, I have reviewed and discussed with patient certain preventive protocols, quality metrics, and best practice recommendations. A written personalized care plan for preventive services as well as general preventive health recommendations were provided to patient.     Fonda LABOR Levins, MD 01/09/2024

## 2024-01-10 LAB — ANEMIA PROFILE B
Basophils Absolute: 0 x10E3/uL (ref 0.0–0.2)
Basos: 0 %
EOS (ABSOLUTE): 0.1 x10E3/uL (ref 0.0–0.4)
Eos: 2 %
Ferritin: 144 ng/mL (ref 15–150)
Folate: 7.9 ng/mL (ref >3.0)
Hematocrit: 41.2 % (ref 34.0–46.6)
Hemoglobin: 13 g/dL (ref 11.1–15.9)
Immature Grans (Abs): 0 x10E3/uL (ref 0.0–0.1)
Immature Granulocytes: 0 %
Iron Saturation: 32 % (ref 15–55)
Iron: 104 ug/dL (ref 27–139)
Lymphocytes Absolute: 1.2 x10E3/uL (ref 0.7–3.1)
Lymphs: 30 %
MCH: 31 pg (ref 26.6–33.0)
MCHC: 31.6 g/dL (ref 31.5–35.7)
MCV: 98 fL — ABNORMAL HIGH (ref 79–97)
Monocytes Absolute: 0.4 x10E3/uL (ref 0.1–0.9)
Monocytes: 10 %
Neutrophils Absolute: 2.3 x10E3/uL (ref 1.4–7.0)
Neutrophils: 58 %
Platelets: 169 x10E3/uL (ref 150–450)
RBC: 4.2 x10E6/uL (ref 3.77–5.28)
RDW: 13.1 % (ref 11.7–15.4)
Retic Ct Pct: 0.9 % (ref 0.6–2.6)
Total Iron Binding Capacity: 326 ug/dL (ref 250–450)
UIBC: 222 ug/dL (ref 118–369)
Vitamin B-12: >2000 pg/mL — ABNORMAL HIGH (ref 232–1245)
WBC: 4 x10E3/uL (ref 3.4–10.8)

## 2024-01-10 LAB — TSH: TSH: 2.34 u[IU]/mL (ref 0.450–4.500)

## 2024-01-14 ENCOUNTER — Other Ambulatory Visit: Payer: Self-pay

## 2024-01-14 ENCOUNTER — Emergency Department (HOSPITAL_COMMUNITY)

## 2024-01-14 ENCOUNTER — Encounter (HOSPITAL_COMMUNITY): Payer: Self-pay

## 2024-01-14 ENCOUNTER — Ambulatory Visit (INDEPENDENT_AMBULATORY_CARE_PROVIDER_SITE_OTHER): Admitting: Nurse Practitioner

## 2024-01-14 ENCOUNTER — Encounter: Payer: Self-pay | Admitting: Nurse Practitioner

## 2024-01-14 ENCOUNTER — Observation Stay (HOSPITAL_COMMUNITY)
Admission: EM | Admit: 2024-01-14 | Discharge: 2024-01-15 | Disposition: A | Discharge status: Home / Self Care | Source: Ambulatory Visit | Attending: Family Medicine | Admitting: Family Medicine

## 2024-01-14 VITALS — BP 135/81 | HR 93 | Temp 97.2°F

## 2024-01-14 DIAGNOSIS — R9431 Abnormal electrocardiogram [ECG] [EKG]: Secondary | ICD-10-CM | POA: Diagnosis not present

## 2024-01-14 DIAGNOSIS — I110 Hypertensive heart disease with heart failure: Secondary | ICD-10-CM | POA: Diagnosis not present

## 2024-01-14 DIAGNOSIS — R079 Chest pain, unspecified: Secondary | ICD-10-CM

## 2024-01-14 DIAGNOSIS — R0789 Other chest pain: Secondary | ICD-10-CM | POA: Insufficient documentation

## 2024-01-14 DIAGNOSIS — I5022 Chronic systolic (congestive) heart failure: Secondary | ICD-10-CM | POA: Diagnosis not present

## 2024-01-14 DIAGNOSIS — Z7982 Long term (current) use of aspirin: Secondary | ICD-10-CM | POA: Insufficient documentation

## 2024-01-14 DIAGNOSIS — J45909 Unspecified asthma, uncomplicated: Secondary | ICD-10-CM | POA: Insufficient documentation

## 2024-01-14 DIAGNOSIS — I429 Cardiomyopathy, unspecified: Secondary | ICD-10-CM | POA: Insufficient documentation

## 2024-01-14 DIAGNOSIS — I10 Essential (primary) hypertension: Secondary | ICD-10-CM | POA: Diagnosis not present

## 2024-01-14 DIAGNOSIS — Z1231 Encounter for screening mammogram for malignant neoplasm of breast: Secondary | ICD-10-CM

## 2024-01-14 LAB — TROPONIN I (HIGH SENSITIVITY)
Troponin I (High Sensitivity): 2 ng/L (ref <18)
Troponin I (High Sensitivity): 2 ng/L (ref <18)

## 2024-01-14 LAB — CBC
HCT: 36.1 % (ref 36.0–46.0)
Hemoglobin: 12.7 g/dL (ref 12.0–15.0)
MCH: 32.7 pg (ref 26.0–34.0)
MCHC: 35.2 g/dL (ref 30.0–36.0)
MCV: 93 fL (ref 80.0–100.0)
Platelets: 172 K/uL (ref 150–400)
RBC: 3.88 MIL/uL (ref 3.87–5.11)
RDW: 12.7 % (ref 11.5–15.5)
WBC: 4.5 K/uL (ref 4.0–10.5)
nRBC: 0 % (ref 0.0–0.2)

## 2024-01-14 LAB — BASIC METABOLIC PANEL WITH GFR
Anion gap: 7 (ref 5–15)
BUN: 11 mg/dL (ref 8–23)
CO2: 25 mmol/L (ref 22–32)
Calcium: 8.8 mg/dL — ABNORMAL LOW (ref 8.9–10.3)
Chloride: 104 mmol/L (ref 98–111)
Creatinine, Ser: 0.82 mg/dL (ref 0.44–1.00)
GFR, Estimated: >60 mL/min (ref >60)
Glucose, Bld: 103 mg/dL — ABNORMAL HIGH (ref 70–99)
Potassium: 3.8 mmol/L (ref 3.5–5.1)
Sodium: 136 mmol/L (ref 135–145)

## 2024-01-14 MED ORDER — ACETAMINOPHEN 650 MG RE SUPP: 650.0000 mg | Freq: Four times a day (QID) | RECTAL | Status: DC | PRN

## 2024-01-14 MED ORDER — IOHEXOL 350 MG/ML SOLN
75.0000 mL | Freq: Once | INTRAVENOUS | Status: AC | PRN
  Administered 2024-01-14: 75 mL via INTRAVENOUS

## 2024-01-14 MED ORDER — MORPHINE SULFATE (PF) 2 MG/ML IV SOLN
2.0000 mg | INTRAVENOUS | Status: DC | PRN
  Administered 2024-01-15 (×2): 2 mg via INTRAVENOUS
  Filled 2024-01-14 (×2): qty 1

## 2024-01-14 MED ORDER — ISOSORBIDE MONONITRATE ER 30 MG PO TB24
30.0000 mg | ORAL_TABLET | Freq: Every day | ORAL | Status: DC
  Administered 2024-01-14 – 2024-01-15 (×2): 30 mg via ORAL
  Filled 2024-01-14 (×2): qty 1

## 2024-01-14 MED ORDER — ENOXAPARIN SODIUM 60 MG/0.6ML IJ SOSY
0.5000 mg/kg | PREFILLED_SYRINGE | INTRAMUSCULAR | Status: DC
  Administered 2024-01-14: 42.5 mg via SUBCUTANEOUS
  Filled 2024-01-14: qty 0.6

## 2024-01-14 MED ORDER — ACETAMINOPHEN 325 MG PO TABS
650.0000 mg | ORAL_TABLET | Freq: Four times a day (QID) | ORAL | Status: DC | PRN
  Administered 2024-01-15: 650 mg via ORAL
  Filled 2024-01-14: qty 2

## 2024-01-14 MED ORDER — ONDANSETRON HCL 4 MG PO TABS: 4.0000 mg | ORAL_TABLET | Freq: Four times a day (QID) | ORAL | Status: DC | PRN

## 2024-01-14 MED ORDER — POLYETHYLENE GLYCOL 3350 17 G PO PACK: 17.0000 g | PACK | Freq: Every day | ORAL | Status: DC | PRN

## 2024-01-14 MED ORDER — ENOXAPARIN SODIUM 40 MG/0.4ML IJ SOSY: 40.0000 mg | PREFILLED_SYRINGE | INTRAMUSCULAR | Status: DC

## 2024-01-14 MED ORDER — FENTANYL CITRATE (PF) 100 MCG/2ML IJ SOLN
50.0000 ug | Freq: Once | INTRAMUSCULAR | Status: AC
  Administered 2024-01-14: 50 ug via INTRAVENOUS
  Filled 2024-01-14: qty 2

## 2024-01-14 MED ORDER — ONDANSETRON HCL 4 MG/2ML IJ SOLN
4.0000 mg | Freq: Four times a day (QID) | INTRAMUSCULAR | Status: DC | PRN
  Administered 2024-01-15: 4 mg via INTRAVENOUS
  Filled 2024-01-14: qty 2

## 2024-01-14 MED ORDER — ATORVASTATIN CALCIUM 40 MG PO TABS
40.0000 mg | ORAL_TABLET | Freq: Every day | ORAL | Status: DC
  Administered 2024-01-14: 40 mg via ORAL
  Filled 2024-01-14: qty 1

## 2024-01-14 MED ORDER — ALUM & MAG HYDROXIDE-SIMETH 200-200-20 MG/5ML PO SUSP
30.0000 mL | Freq: Once | ORAL | Status: AC
  Administered 2024-01-14: 30 mL via ORAL
  Filled 2024-01-14: qty 30

## 2024-01-14 NOTE — H&P (Signed)
 History and Physical    [name]:[id number] DOB: Aug 20, 1960 DOA: 01/14/2024  PCP: Dettinger, Fonda LABOR, MD   Patient coming from: Home  I have personally briefly reviewed patient's old medical records in [facility]  Chief Complaint: Chest pain  HPI: TSERING Vazquez is a 63 y.o. female with medical history significant for Takotsubo cardiomyopathy, hypertension. Patient presented to the ED with complaints of sudden onset of sharp left-sided chest pain.  She reports onset of sharp pains in her back radiating to her left chest started while she was getting ready for work today. Pain is worse with movement of her arms.  She reports onset of diaphoresis, difficulty breathing when chest pain started.  No relief with 3 nitro she took at home.  Improvement in pain with fentanyl  given in ED.  No known relieving factors.  Denies prior chest pain or difficulty breathing even with activity prior to today.    ED Course: Stable vitals.  WBC 4.5.  Troponin 2 x 2.   GI cocktail, fentanyl  given in ED. patient tells me fentanyl  worked the most. Chest xray- negative for acute abnormality.   CTA chest negative for PE or other acute abnormality. EDP talked to cardiology Dr. Vola  30 mg daily, Echo AM, cardiology to see in consult.  Review of Systems: As per HPI all other systems reviewed and negative.  Past Medical History:  Diagnosis Date   Anxiety    Arthritis    back (12/30/2014)   Asthma    as a child, no problems as an adult, has an albuterol  inhaler   Cerebral cyst    per brain MRI 07-12-2019 unchanged 6mm cyst inferomedial right frontal lobe   Chronic headaches    Chronic systolic (congestive) heart failure (HCC)    followed by dr orlean   Complication of anesthesia    pt is very sensitive to benzodiazepines, pt stated almost died   Compression fracture of lumbar vertebra (HCC) 05/27/2020   per pt L3 and L4   DCM (dilated cardiomyopathy) (HCC)    w/ Takatsubo---   followed by dr delford---  stress-induced --- cardiac cath 12-30-2014 ef 30-35%, recovered per cardiac MRI 03-15-2015 ef  64%   Depression    Disorder of mastoid    per brain MRI 07-12-2019 persistant large mastoid effusion   Dysuria    Environmental and seasonal allergies    Frequency of urination    GERD (gastroesophageal reflux disease)    History of asthma    childhood   History of esophageal spasm    History of kidney stones    History of melanoma excision 2001   right supraorbital (right forehead and upper eyelid )  s/p  Moh's procedure w/ sln bx,  no recurrence per pt   History of non-ST elevation myocardial infarction (NSTEMI) 12/29/2014   per cath normal coronaries , ef 30-35%---  stress-- induced Takotsubo syndrome    History of palpitations 01/05/2015   STRESS INDUCED   History of parotid cancer 2008   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-2006 s/p  right lateral parotidectomy w/ nerve dissection , modified radical neck dissection ;  completed  x35 fractions raditation 2008;  no recurrence per pt   Hyperlipidemia    Hypertension    Kidney cysts    Left ureteral calculus    Low back pain    Melanoma (HCC) 2001   face   Muscle spasm of back    Myocardial infarction (HCC) 2016  Pneumonia    as a child   PONV (postoperative nausea and vomiting)    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   Squamous cell carcinoma of skin 02/22/2021   in istu- right forehead ([id number])   Takotsubo syndrome 12/29/2014   cardiologist---  dr orlean--- dx NSTEMI -- stress-induced w/ DCM--  per cath 12-30-2014 normal coronaries , ef 30-35%,  recovered ef per cardiac MRI  ef 64%   Urgency of urination    Wears glasses     Past Surgical History:  Procedure Laterality Date   BACK SURGERY     BREAST BIOPSY Bilateral early 2000's   CARDIAC CATHETERIZATION  2006 (APPROX)  [place]   NORMAL   CARDIAC CATHETERIZATION  12/30/2014   CARDIAC CATHETERIZATION N/A  12/30/2014   Procedure: Left Heart Cath and Coronary Angiography;  Surgeon: Deatrice DELENA Cage, MD;  Location: [facility];  Service: Cardiovascular;  Laterality: N/A;   CARDIOVASCULAR STRESS TEST  03-21-2012  DR DELFORD   NORMAL NUCLEAR STUDY/  NO ISCHEMIA/  EF 63%   COLONOSCOPY  10/2020   COLONOSCOPY WITH PROPOFOL   05/29/2017   CYSTOSCOPY W/ URETERAL STENT PLACEMENT Left 03/26/2013   Procedure: CYSTOSCOPY WITH RETROGRADE PYELOGRAM ;  Surgeon: Toribio Neysa Repine, MD;  Location: [facility];  Service: Urology;  Laterality: Left;   CYSTOSCOPY WITH RETROGRADE PYELOGRAM, URETEROSCOPY AND STENT PLACEMENT Bilateral 03/19/2013   Procedure: CYSTOSCOPY WITH RETROGRADE PYELOGRAM, BILATERAL URETEROSCOPY AND STENT PLACEMENT LEFT URETER,BILATERAL STONE EXTRACTION , HOLMIUM LASER LEFT URETER;  Surgeon: Toribio Neysa Repine, MD;  Location: [facility];  Service: Urology;  Laterality: Bilateral;   CYSTOSCOPY WITH STENT PLACEMENT Left 03/26/2013   Procedure: CYSTOSCOPY WITH STENT PLACEMENT;  Surgeon: Toribio Neysa Repine, MD;  Location: [facility];  Service: Urology;  Laterality: Left;   CYSTOSCOPY/URETEROSCOPY/HOLMIUM LASER/STENT PLACEMENT Left 07/07/2020   Procedure: CYSTOSCOPY LEFT URETEROSCOPY/HOLMIUM LASER/STENT PLACEMENT;  Surgeon: Carolee Sherwood JONETTA DOUGLAS, MD;  Location: [facility];  Service: Urology;  Laterality: Left;   DIAGNOSTIC LAPAROSCOPY  04/12/2009   ESOPHAGOGASTRODUODENOSCOPY (EGD) WITH PROPOFOL  N/A 02/10/2016   Procedure: ESOPHAGOGASTRODUODENOSCOPY (EGD) WITH PROPOFOL ;  Surgeon: Lamar Bunk, MD;  Location: [facility];  Service: Endoscopy;  Laterality: N/A;   KIDNEY SURGERY  1966   BILATERAL URETER'S DILATATION   KNEE ARTHROSCOPY WITH MEDIAL MENISECTOMY Left 10/03/2023   Procedure: ARTHROSCOPY, KNEE, WITH MEDIAL MENISCECTOMY;  Surgeon: Duwayne Purchase, MD;  Location: [facility];  Service: Orthopedics;  Laterality: Left;  Left knee arthroscopy, partial  medial meniscectomy and debridement   LUMBAR LAMINECTOMY/DECOMPRESSION MICRODISCECTOMY  05/18/2011   Procedure: LUMBAR LAMINECTOMY/DECOMPRESSION MICRODISCECTOMY;  Surgeon: Purchase JAYSON Duwayne;  Location: [facility];  Service: Orthopedics;  Laterality: Right;  Decompression Lumbar 4-Lumbar 5  Right    (xray)    LUMBAR LAMINECTOMY/DECOMPRESSION MICRODISCECTOMY N/A 05/06/2021   Procedure: Microlumbar decompression L5-S1;  Surgeon: Duwayne Purchase, MD;  Location: [facility];  Service: Orthopedics;  Laterality: N/A;  3 C-bed 90 mins   MELANOMA EXCISION WITH SENTINEL LYMPH NODE BIOPSY  2001   moh's procedure/  RIGHT FOREHEAD AND UPPER EYEBROW   MENISCUS DEBRIDEMENT Left 10/03/2023   Procedure: DEBRIDEMENT, MENISCUS, KNEE;  Surgeon: Duwayne Purchase, MD;  Location: [facility];  Service: Orthopedics;  Laterality: Left;   RIGHT LATERAL PAROTIDECTOMY W/ NERVE DISSECTION / RIGHT MODIFIED RADICAL NECK DISSECTION SPARING SCM ELEVENTH NERVE AND INTERNAL JUGULAR VEIN  09-12-2006  DR DWIGHT BATES   DR DWIGHT BATES; inside gland; lots of lymph nodes  reports that she has never smoked. She has never used smokeless tobacco. She reports that she does not currently use alcohol. She reports that she does not use drugs.  Allergies  Allergen Reactions   Cephalexin Rash and Shortness Of Breath    Keflex   Lorazepam  Shortness Of Breath and Other (See Comments)    She may be very sensitive to benzo.    Stops breathing    Promethazine      Felt strange, knocked me out   Seasonal Ic [Octacosanol] Other (See Comments)    Family History  Problem Relation Age of Onset   Hypertension Mother    COPD Mother    Breast cancer Mother        breast   Dementia Mother    Heart disease Father    Cancer Father        Colorectal   Hyperlipidemia Father    Hypertension Father    Colon cancer Sister     Prior to Admission medications   Medication Sig Start Date End Date Taking? Authorizing Provider  atorvastatin  (LIPITOR ) 40 MG  tablet Take 1 tablet (40 mg total) by mouth daily. Patient taking differently: Take 40 mg by mouth at bedtime. 04/13/23   Dettinger, Fonda LABOR, MD  calcium  carbonate (TUMS - DOSED IN MG ELEMENTAL CALCIUM ) 500 MG chewable tablet Chew 1,000 mg by mouth daily as needed for indigestion or heartburn.    [provider]  carvedilol  (COREG ) 3.125 MG tablet Take 1 tablet (3.125 mg total) by mouth 2 (two) times daily with a meal. 09/19/23   Delford Maude BROCKS, MD  Cholecalciferol  (D-3-5 PO) Take 1 tablet by mouth daily.    [provider]  docusate sodium  (COLACE) 100 MG capsule Take 1 capsule (100 mg total) by mouth 2 (two) times daily as needed for mild constipation. 05/06/21   Duwayne Purchase, MD  estradiol  (ESTRACE ) 0.1 MG/GM vaginal cream Discard plastic applicator. Insert a blueberry size amount (approximately 1 gram) of cream on fingertip inside vagina at bedtime every night for 1 week then every other night. For long term use. 08/27/23   Larocco, Sarah C, FNP  lisinopril  (ZESTRIL ) 2.5 MG tablet Take 1 tablet (2.5 mg total) by mouth every morning. 09/19/23   Nishan, Peter C, MD  milk thistle 175 MG tablet Take 175 mg by mouth daily.    [provider]  nitroGLYCERIN  (NITROSTAT ) 0.4 MG SL tablet Place 1 tablet (0.4 mg total) under the tongue every 5 (five) minutes as needed for chest pain. DISSOLVE ONE TABLET UNDER THE TONGUE EVERY 5 MINUTES AS NEEDED FOR CHEST PAIN.  DO NOT EXCEED A TOTAL OF 3 DOSES IN 15 MINUTES 12/07/23   Delford Maude BROCKS, MD  nortriptyline  (PAMELOR ) 10 MG capsule Take 3 capsules (30 mg total) by mouth at bedtime. 01/31/23 01/31/24  Leigh Venetia CROME, MD  oxyCODONE  (OXY IR/ROXICODONE ) 5 MG immediate release tablet Take 1 tablet (5 mg total) by mouth every 4 (four) hours as needed for severe pain (pain score 7-10). 10/03/23   Duwayne Purchase, MD  vitamin B-12 (CYANOCOBALAMIN) 500 MCG tablet Take 500 mcg by mouth daily.    [provider]    Physical Exam: Vitals:    01/14/24 1417 01/14/24 1430 01/14/24 1445 01/14/24 1613  BP: (!) 155/86 (!) 173/70 (!) 149/78   Pulse: 77 73 76 71  Resp: 16 14 14 17   Temp:    98.8 F (37.1 C)  TempSrc:      SpO2: 98%  98% 95% 98%  Weight:      Height:        Constitutional: NAD, calm, comfortable Vitals:   01/14/24 1417 01/14/24 1430 01/14/24 1445 01/14/24 1613  BP: (!) 155/86 (!) 173/70 (!) 149/78   Pulse: 77 73 76 71  Resp: 16 14 14 17   Temp:    98.8 F (37.1 C)  TempSrc:      SpO2: 98% 98% 95% 98%  Weight:      Height:       Eyes: PERRL, lids and conjunctivae normal ENMT: Mucous membranes are moist.   Neck: normal, supple, no masses, no thyromegaly Respiratory: clear to auscultation bilaterally, no wheezing, no crackles.   Cardiovascular: Regular rate and rhythm, no murmurs / rubs / gallops.   Abdomen: no tenderness, no masses palpated. No hepatosplenomegaly. Bowel sounds positive.  Musculoskeletal: no clubbing / cyanosis. No joint deformity upper and lower extremities.  Skin: no rashes, lesions, ulcers. No induration Neurologic: No facial asymmetry, moving extremities spontaneously, speech fluent.  Psychiatric: Normal judgment and insight. Alert and oriented x 3. Normal mood.   Labs on Admission: I have personally reviewed following labs and imaging studies  CBC: Recent Labs  Lab 01/09/24 1433 01/14/24 1139  WBC 4.0 4.5  NEUTROABS 2.3  --   HGB 13.0 12.7  HCT 41.2 36.1  MCV 98* 93.0  PLT 169 172   Basic Metabolic Panel: Recent Labs  Lab 01/14/24 1139  NA 136  K 3.8  CL 104  CO2 25  GLUCOSE 103*  BUN 11  CREATININE 0.82  CALCIUM  8.8*   Urine analysis:    Component Value Date/Time   COLORURINE STRAW (A) 03/27/2023 1315   APPEARANCEUR Clear 01/01/2024 1521   LABSPEC 1.005 03/27/2023 1315   PHURINE 6.0 03/27/2023 1315   GLUCOSEU Negative 01/01/2024 1521   HGBUR SMALL (A) 03/27/2023 1315   BILIRUBINUR Negative 01/01/2024 1521   KETONESUR NEGATIVE 03/27/2023 1315   PROTEINUR  Negative 01/01/2024 1521   PROTEINUR NEGATIVE 03/27/2023 1315   UROBILINOGEN 0.2 04/07/2015 1114   NITRITE Negative 01/01/2024 1521   NITRITE NEGATIVE 03/27/2023 1315   LEUKOCYTESUR Trace (A) 01/01/2024 1521   LEUKOCYTESUR MODERATE (A) 03/27/2023 1315    Radiological Exams on Admission: CT Angio Chest Aorta W and/or Wo Contrast Result Date: 01/14/2024 CLINICAL DATA:  Chest pain radiating to the back EXAM: CT ANGIOGRAPHY CHEST WITH CONTRAST TECHNIQUE: Multidetector CT imaging of the chest was performed using the standard protocol during bolus administration of intravenous contrast. Multiplanar CT image reconstructions and MIPs were obtained to evaluate the vascular anatomy. RADIATION DOSE REDUCTION: This exam was performed according to the departmental dose-optimization program which includes automated exposure control, adjustment of the mA and/or kV according to patient size and/or use of iterative reconstruction technique. CONTRAST:  75mL OMNIPAQUE  IOHEXOL  350 MG/ML SOLN COMPARISON:  Cardiac CT dated 09/05/2023, CTA chest dated 08/01/2023 FINDINGS: Cardiovascular: Preferential opacification of the thoracic aorta. No evidence of thoracic aortic aneurysm or dissection. Normal heart size. No pericardial effusion. No central pulmonary embolism. Aortic atherosclerosis. Mediastinum/Nodes: Imaged thyroid  gland without nodules meeting criteria for imaging follow-up by size. Normal esophagus. No pathologically enlarged axillary, supraclavicular, mediastinal, or hilar lymph nodes. Lungs/Pleura: The central airways are patent. No focal consolidation. No pneumothorax. No pleural effusion. Upper abdomen: Normal. Musculoskeletal: No acute or abnormal lytic or blastic osseous lesions. Multilevel degenerative changes of the thoracic spine. Review of the MIP images confirms the above findings. IMPRESSION: 1. No evidence of thoracic aortic aneurysm or dissection.  2. No acute intrathoracic abnormality. 3.  Aortic  Atherosclerosis (ICD10-I70.0). Electronically Signed   By: Limin  Xu M.D.   On: 01/14/2024 15:15   DG Chest 2 View Result Date: 01/14/2024 CLINICAL DATA:  Back pain and chest pain x3 hours. EXAM: CHEST - 2 VIEW COMPARISON:  August 01, 2023 FINDINGS: The heart size and mediastinal contours are within normal limits. Low lung volumes are noted. No acute infiltrate, pleural effusion or pneumothorax is identified. A solitary surgical clip versus biopsy clip is seen within the soft tissues of the left breast. The visualized skeletal structures are unremarkable. IMPRESSION: No active cardiopulmonary disease. Electronically Signed   By: Suzen Dials M.D.   On: 01/14/2024 13:23   EKG: Independently reviewed.  Sinus rhythm, rate 79, QTc 462.  Nonspecific changes to lead V5 and V6.  Otherwise no other abnormalities are similar to prior EKG.  Assessment/Plan Principal Problem:   Chest pain Active Problems:   Cardiomyopathy (HCC)   Essential (primary) hypertension  Assessment and Plan: * Chest pain Atypical chest pains-worse with arm movement, no improvement with nitro and GI cocktail.  Troponin 2  X 2.  EKG with nonspecific changes to V5 and V6.  Non-smoker.  History of Takotsubo cardiomyopathy.  Recent CT coronary 09/05/2023-no evidence of coronary artery disease.  Recent echo 08/2023, EF of 60 to 65%, grade 1 DD. - EDP talked to Dr. Mallipeddi-start Imdur  30 mg daily, and echo - Considering recent echo, will obtain limited echo -IV morphine  2 mg - Resume carvedilol , Lipitor   Essential (primary) hypertension Stable.  Awaiting med reconciliation -Resume carvedilol    DVT prophylaxis: Lovenox  Code Status: FULL Family Communication: None at bedside Disposition Plan: ~ 2 days Consults called: Cardiology Admission status: Obs tele    Author: Tully FORBES Carwin, MD 01/14/2024 6:50 PM  For on call review [email or web address].

## 2024-01-14 NOTE — Progress Notes (Signed)
 Acute Office Visit  Subjective:     Patient ID: [name], female    DOB: [birth year], 63 y.o.   MRN: [id number]  Chief Complaint  Patient presents with   Chest Pain    Sob 3 nitroglycerin     [name] is a 63 year old female presenting on 01/14/2024 with acute onset chest pain. Around 9:45 AM while dressing for work, she developed chest pain originating from her back and radiating to the left breast. The pain is described as 7/10 in intensity and is associated with shortness of breath. She self-administered three doses of sublingual nitroglycerin , which provided partial relief.  The patient has a history of NSTEMI and Takotsubo cardiomyopathy diagnosed in 2016, with annual cardiology follow-up. She denies cough, dizziness, headaches, nausea, vomiting, syncope, palpitations, diaphoresis, recent exertional changes, or any new medications.  Active Ambulatory Problems    Diagnosis Date Noted   GERD (gastroesophageal reflux disease) 03/21/2012   Recent NSTEMI (Taktosubo event 12/30/14) 12/29/2014   IBS (irritable bowel syndrome) 01/05/2015   Biliary dyskinesia 01/05/2015   ICM- EF 30-35% at cath 12/30/14-improved to 45% by echo 01/06/15 01/06/2015   Orthostatic hypotension 01/06/2015   Cardiomyopathy (HCC)    Hyperlipidemia 12/26/2015   Essential (primary) hypertension 12/26/2015   Aortic atherosclerosis (HCC) 02/04/2016   Obesity (BMI 30.0-34.9) 01/03/2018   Chronic midline low back pain with sciatica 08/10/2020   Osteoarthritis of right knee 08/14/2019   Spinal stenosis at L4-L5 level 05/06/2021   History of head or neck cancer 01/06/2021   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 04/02/2017   Right chronic serous otitis media 04/02/2017   Back pain of thoracolumbar region 10/15/2015   Atherosclerotic heart disease of native coronary artery without angina pectoris 11/10/2021   Chronic pelvic pain in female  04/19/2015   Recurrent nephrolithiasis 05/14/2015   Interstitial cystitis 08/25/2023   Migraines 08/27/2023   Chronic constipation 08/27/2023   Anemia 11/23/2023   Asymmetric SNHL (sensorineural hearing loss) 05/08/2017   Atrophic vaginitis 11/23/2023   Laryngopharyngeal reflux (LPR) 04/24/2017   Dysfunction of right eustachian tube 06/20/2017   Myringotomy tube status 08/16/2017   Palatal lesion 11/06/2023   Oral ulcer 05/25/2016   Neck mass 10/11/2022   Uric acid renal calculus 06/19/1972   Chest pain 01/14/2024   Abnormal electrocardiogram (ECG) (EKG) 01/14/2024   Resolved Ambulatory Problems    Diagnosis Date Noted   PONV (postoperative nausea and vomiting)    Cancer (HCC)    Myoepithelioma of salivary gland    Kidney stones    Chest pain 03/21/2012   Hematuria 02/24/2013   Blood in urine 02/24/2013   Near syncope 01/05/2015   Urinary tract infection, site not specified 03/27/2015   Respiratory failure (HCC) 12/26/2015   Abdominal pain 12/26/2015   Constipation 12/29/2015   Dysuria 08/10/2020   Weakness 05/11/2021   Past Medical History:  Diagnosis Date   Anxiety    Arthritis    Asthma    Cerebral cyst    Chronic headaches    Chronic systolic (congestive) heart failure (HCC)    Complication of anesthesia    Compression fracture of lumbar vertebra (HCC) 05/27/2020   DCM (dilated cardiomyopathy) (HCC)    Depression    Disorder of mastoid    Environmental and seasonal allergies    Frequency of urination    History of asthma    History of esophageal spasm    History of kidney stones  History of melanoma excision 2001   History of non-ST elevation myocardial infarction (NSTEMI) 12/29/2014   History of palpitations 01/05/2015   History of parotid cancer 2008   Hypertension    Kidney cysts    Left ureteral calculus    Low back pain    Melanoma (HCC) 2001   Muscle spasm of back    Myocardial infarction (HCC) 2016   Pneumonia    Salivary gland cancer  (HCC) 2008   Squamous cell carcinoma of skin 02/22/2021   Takotsubo syndrome 12/29/2014   Urgency of urination    Wears glasses     Review of Systems  HENT:  Negative for ear pain and sore throat.   Respiratory:  Positive for shortness of breath. Negative for cough.   Cardiovascular:  Positive for chest pain.       10/10 pain when firs started, sharp  Gastrointestinal:  Negative for blood in stool, nausea and vomiting.  Musculoskeletal:  Negative for falls.  Skin:  Negative for itching and rash.  Neurological:  Negative for dizziness and headaches.   Negative unless indicated in HPI    Objective:    BP 135/81   Pulse 93   Temp (!) 97.2 F (36.2 C) (Temporal)   LMP 05/18/2011   SpO2 95%  BP Readings from Last 3 Encounters:  01/14/24 (!) 133/55  01/14/24 135/81  01/09/24 128/82   Wt Readings from Last 3 Encounters:  01/14/24 190 lb (86.2 kg)  01/09/24 191 lb (86.6 kg)  10/12/23 201 lb (91.2 kg)      Physical Exam Vitals and nursing note reviewed.  Constitutional:      General: She is not in acute distress. HENT:     Head: Normocephalic and atraumatic.     Right Ear: Tympanic membrane, ear canal and external ear normal. There is no impacted cerumen.     Left Ear: Tympanic membrane, ear canal and external ear normal. There is no impacted cerumen.     Nose: Nose normal.     Mouth/Throat:     Mouth: Mucous membranes are moist.  Eyes:     General: No scleral icterus.    Extraocular Movements: Extraocular movements intact.     Conjunctiva/sclera: Conjunctivae normal.     Pupils: Pupils are equal, round, and reactive to light.  Cardiovascular:     Rate and Rhythm: Regular rhythm.     Comments: 7/10 sharp pain post 3 Nitro Pulmonary:     Effort: Pulmonary effort is normal.     Breath sounds: Normal breath sounds.  Neurological:     Mental Status: She is alert.    Pertinent labs & imaging results that were available during my care of the patient were reviewed by  me and considered in my medical decision making.  Results for orders placed or performed during the hospital encounter of 01/14/24  Basic metabolic panel  Result Value Ref Range   Sodium 136 135 - 145 mmol/L   Potassium 3.8 3.5 - 5.1 mmol/L   Chloride 104 98 - 111 mmol/L   CO2 25 22 - 32 mmol/L   Glucose, Bld 103 (H) 70 - 99 mg/dL   BUN 11 8 - 23 mg/dL   Creatinine, Ser 9.17 0.44 - 1.00 mg/dL   Calcium  8.8 (L) 8.9 - 10.3 mg/dL   GFR, Estimated >39 >39 mL/min   Anion gap 7 5 - 15  CBC  Result Value Ref Range   WBC 4.5 4.0 - 10.5 K/uL   RBC  3.88 3.87 - 5.11 MIL/uL   Hemoglobin 12.7 12.0 - 15.0 g/dL   HCT 63.8 63.9 - 53.9 %   MCV 93.0 80.0 - 100.0 fL   MCH 32.7 26.0 - 34.0 pg   MCHC 35.2 30.0 - 36.0 g/dL   RDW 87.2 88.4 - 84.4 %   Platelets 172 150 - 400 K/uL   nRBC 0.0 0.0 - 0.2 %  Troponin I (High Sensitivity)  Result Value Ref Range   Troponin I (High Sensitivity) 2 <18 ng/L        Assessment & Plan:  Chest pain, unspecified type -     EKG 12-Lead  Abnormal electrocardiogram (ECG) (EKG)   [name] is a 63 year old Caucasian female presents to the lobby with chest pain not relieved with nitro x 3, EKG done EMS called client will be transported to the ED/AP for further care  The above assessment and management plan was discussed with the patient. The patient verbalized understanding of and has agreed to the management plan. Patient is aware to call the clinic if they develop any new symptoms or if symptoms persist or worsen. Patient is aware when to return to the clinic for a follow-up visit. Patient educated on when it is appropriate to go to the emergency department.  Return for follow-up with PCP post ED discharge.  Alyshia Kernan [place] Thompson, DNP [facility] [address] [place], [place] [address] [phone]  Note: This document was prepared by [employer] voice dictation technology and any errors that results from this process are  unintentional.

## 2024-01-14 NOTE — Assessment & Plan Note (Addendum)
 Atypical chest pains-worse with arm movement, no improvement with nitro and GI cocktail.  Troponin 2  X 2.  EKG with nonspecific changes to V5 and V6.  Non-smoker.  History of Takotsubo cardiomyopathy.  Recent CT coronary 09/05/2023-no evidence of coronary artery disease.  Recent echo 08/2023, EF of 60 to 65%, grade 1 DD. - EDP talked to Dr. Mallipeddi-start Imdur  30 mg daily, and echo - Considering recent echo, will obtain limited echo -IV morphine  2 mg - Resume carvedilol , Lipitor 

## 2024-01-14 NOTE — Assessment & Plan Note (Signed)
 Stable.  Awaiting med reconciliation -Resume carvedilol 

## 2024-01-14 NOTE — Progress Notes (Signed)
 PHARMACIST - PHYSICIAN COMMUNICATION  CONCERNING:  Enoxaparin  (Lovenox ) for DVT Prophylaxis    RECOMMENDATION: Patient was prescribed enoxaprin 40mg  q24 hours for VTE prophylaxis.   Filed Weights   01/14/24 1136 01/14/24 1724  Weight: 86.2 kg (190 lb) 87.1 kg (192 lb 0.3 oz)    Body mass index is 32.96 kg/m.  Estimated Creatinine Clearance: 73.1 mL/min (by C-G formula based on SCr of 0.82 mg/dL).   Based on [facility] policy patient is candidate for enoxaparin  0.5mg /kg TBW SQ every 24 hours based on BMI being >30.  DESCRIPTION: Pharmacy has adjusted enoxaparin  dose per [facility] policy.  Patient is now receiving enoxaparin  42.5 mg every 24 hours    Raider Valbuena Rodriguez-Guzman PharmD, BCPS 01/14/2024 5:41 PM

## 2024-01-14 NOTE — Progress Notes (Signed)
 Nurse at bedside,patient alert and oriented times four.Blood pressure 184/115,heart rate 80,Dr Ejiroghene Emokpae notified.No c/o pain or discomfort noted at this time. Plan of care on going.

## 2024-01-14 NOTE — ED Triage Notes (Signed)
 Pt BIB [facility] from PCP for chest pain that radiates to back. Pt took 3 nitroglycerin  at home and was given 325 aspirin  en route to hospital. Pt states back pain has subsided but still having thud feeling in middle of chest to rt breast. Pt states increased pain with movement.

## 2024-01-14 NOTE — ED Provider Notes (Signed)
 [place] EMERGENCY DEPARTMENT AT [facility] Provider Note   CSN: [id number] Arrival date & time: 01/14/24  1126     Patient presents with: Chest Pain   [name] is a 63 y.o. female.  She presents ER today complaining of chest pain that started as a sharp pain in her back radiating into the left breast area around 945 this morning.  She states she was sweating with this and felt short of breath due to feeling like the pain was taking her breath away.  She took 3 of her nitroglycerin  and states this improved the back pain.  She went to her PCP and was still having chest pain and they advised her to come to the ER for further evaluation.  She had pain like this before later this year but states today the pain in her back was much more severe.  Had not eaten before this but did drink some coffee.  Denies nausea or vomiting.  Pain was not exertional.  No pleuritic pain she denies any leg swelling pain outside of her baseline.  She states she had Takotsubo cardiomyopathy in 2016.       Chest Pain      Prior to Admission medications   Medication Sig Start Date End Date Taking? Authorizing Provider  atorvastatin  (LIPITOR ) 40 MG tablet Take 1 tablet (40 mg total) by mouth daily. Patient taking differently: Take 40 mg by mouth at bedtime. 04/13/23   Dettinger, Fonda LABOR, MD  calcium  carbonate (TUMS - DOSED IN MG ELEMENTAL CALCIUM ) 500 MG chewable tablet Chew 1,000 mg by mouth daily as needed for indigestion or heartburn.    [provider]  carvedilol  (COREG ) 3.125 MG tablet Take 1 tablet (3.125 mg total) by mouth 2 (two) times daily with a meal. 09/19/23   Delford Maude BROCKS, MD  Cholecalciferol  (D-3-5 PO) Take 1 tablet by mouth daily.    [provider]  docusate sodium  (COLACE) 100 MG capsule Take 1 capsule (100 mg total) by mouth 2 (two) times daily as needed for mild constipation. 05/06/21   Duwayne Purchase, MD  estradiol  (ESTRACE ) 0.1 MG/GM vaginal cream  Discard plastic applicator. Insert a blueberry size amount (approximately 1 gram) of cream on fingertip inside vagina at bedtime every night for 1 week then every other night. For long term use. 08/27/23   Larocco, Sarah C, FNP  lisinopril  (ZESTRIL ) 2.5 MG tablet Take 1 tablet (2.5 mg total) by mouth every morning. 09/19/23   Nishan, Peter C, MD  milk thistle 175 MG tablet Take 175 mg by mouth daily.    [provider]  nitroGLYCERIN  (NITROSTAT ) 0.4 MG SL tablet Place 1 tablet (0.4 mg total) under the tongue every 5 (five) minutes as needed for chest pain. DISSOLVE ONE TABLET UNDER THE TONGUE EVERY 5 MINUTES AS NEEDED FOR CHEST PAIN.  DO NOT EXCEED A TOTAL OF 3 DOSES IN 15 MINUTES 12/07/23   Delford Maude BROCKS, MD  nortriptyline  (PAMELOR ) 10 MG capsule Take 3 capsules (30 mg total) by mouth at bedtime. 01/31/23 01/31/24  Leigh Venetia CROME, MD  oxyCODONE  (OXY IR/ROXICODONE ) 5 MG immediate release tablet Take 1 tablet (5 mg total) by mouth every 4 (four) hours as needed for severe pain (pain score 7-10). 10/03/23   Duwayne Purchase, MD  vitamin B-12 (CYANOCOBALAMIN) 500 MCG tablet Take 500 mcg by mouth daily.    [provider]    Allergies: Cephalexin, Lorazepam , Promethazine , and Seasonal ic [octacosanol]    Review  of Systems  Cardiovascular:  Positive for chest pain.    Updated Vital Signs BP (!) 149/78   Pulse 71   Temp 98.8 F (37.1 C)   Resp 17   Ht 5' 4 (1.626 m)   Wt 86.2 kg   LMP 05/18/2011   SpO2 98%   BMI 32.61 kg/m   Physical Exam Vitals and nursing note reviewed.  Constitutional:      General: She is not in acute distress.    Appearance: She is well-developed.  HENT:     Head: Normocephalic and atraumatic.  Eyes:     Conjunctiva/sclera: Conjunctivae normal.  Cardiovascular:     Rate and Rhythm: Normal rate and regular rhythm.     Heart sounds: No murmur heard. Pulmonary:     Effort: Pulmonary effort is normal. No respiratory distress.     Breath sounds: Normal  breath sounds.  Chest:     Chest wall: No tenderness, crepitus or edema.  Abdominal:     Palpations: Abdomen is soft. There is no mass.     Tenderness: There is no abdominal tenderness. There is no guarding.  Musculoskeletal:        General: No swelling.     Cervical back: Neck supple.     Right lower leg: No tenderness. No edema.     Left lower leg: No tenderness. No edema.  Skin:    General: Skin is warm and dry.     Capillary Refill: Capillary refill takes less than 2 seconds.  Neurological:     General: No focal deficit present.     Mental Status: She is alert and oriented to person, place, and time.  Psychiatric:        Mood and Affect: Mood normal.     (all labs ordered are listed, but only abnormal results are displayed) Labs Reviewed  BASIC METABOLIC PANEL WITH GFR - Abnormal; Notable for the following components:      Result Value   Glucose, Bld 103 (*)    Calcium  8.8 (*)    All other components within normal limits  CBC  TROPONIN I (HIGH SENSITIVITY)  TROPONIN I (HIGH SENSITIVITY)    EKG: EKG Interpretation Date/Time:  Monday January 14 2024 11:37:19 EDT Ventricular Rate:  79 PR Interval:  118 QRS Duration:  92 QT Interval:  403 QTC Calculation: 462 R Axis:   -64  Text Interpretation: Sinus rhythm Borderline short PR interval Left anterior fascicular block Low voltage, precordial leads Abnormal R-wave progression, late transition Nonspecific T abnormalities, anterior leads similar to prior no stemi Confirmed by Elnor Savant ([id number]) on 01/14/2024 1:47:22 PM  Radiology: CT Angio Chest Aorta W and/or Wo Contrast Result Date: 01/14/2024 CLINICAL DATA:  Chest pain radiating to the back EXAM: CT ANGIOGRAPHY CHEST WITH CONTRAST TECHNIQUE: Multidetector CT imaging of the chest was performed using the standard protocol during bolus administration of intravenous contrast. Multiplanar CT image reconstructions and MIPs were obtained to evaluate the vascular anatomy. RADIATION  DOSE REDUCTION: This exam was performed according to the departmental dose-optimization program which includes automated exposure control, adjustment of the mA and/or kV according to patient size and/or use of iterative reconstruction technique. CONTRAST:  75mL OMNIPAQUE  IOHEXOL  350 MG/ML SOLN COMPARISON:  Cardiac CT dated 09/05/2023, CTA chest dated 08/01/2023 FINDINGS: Cardiovascular: Preferential opacification of the thoracic aorta. No evidence of thoracic aortic aneurysm or dissection. Normal heart size. No pericardial effusion. No central pulmonary embolism. Aortic atherosclerosis. Mediastinum/Nodes: Imaged thyroid  gland without nodules meeting criteria for  imaging follow-up by size. Normal esophagus. No pathologically enlarged axillary, supraclavicular, mediastinal, or hilar lymph nodes. Lungs/Pleura: The central airways are patent. No focal consolidation. No pneumothorax. No pleural effusion. Upper abdomen: Normal. Musculoskeletal: No acute or abnormal lytic or blastic osseous lesions. Multilevel degenerative changes of the thoracic spine. Review of the MIP images confirms the above findings. IMPRESSION: 1. No evidence of thoracic aortic aneurysm or dissection. 2. No acute intrathoracic abnormality. 3.  Aortic Atherosclerosis (ICD10-I70.0). Electronically Signed   By: Limin  Xu M.D.   On: 01/14/2024 15:15   DG Chest 2 View Result Date: 01/14/2024 CLINICAL DATA:  Back pain and chest pain x3 hours. EXAM: CHEST - 2 VIEW COMPARISON:  August 01, 2023 FINDINGS: The heart size and mediastinal contours are within normal limits. Low lung volumes are noted. No acute infiltrate, pleural effusion or pneumothorax is identified. A solitary surgical clip versus biopsy clip is seen within the soft tissues of the left breast. The visualized skeletal structures are unremarkable. IMPRESSION: No active cardiopulmonary disease. Electronically Signed   By: Suzen Dials M.D.   On: 01/14/2024 13:23     Procedures    Medications Ordered in the ED  isosorbide  mononitrate (IMDUR ) 24 hr tablet 30 mg (30 mg Oral Given 01/14/24 1640)  fentaNYL  (SUBLIMAZE ) injection 50 mcg (50 mcg Intravenous Given 01/14/24 1412)  iohexol  (OMNIPAQUE ) 350 MG/ML injection 75 mL (75 mLs Intravenous Contrast Given 01/14/24 1404)  alum & mag hydroxide-simeth (MAALOX/MYLANTA) 200-200-20 MG/5ML suspension 30 mL (30 mLs Oral Given 01/14/24 1550)                                    Medical Decision Making Differential diagnosis includes but not limited to ACS, aortic dissection, aortic aneurysm, pneumothorax, pneumonia, PE, GERD, esophageal spasms, other  Course: Patient with history of Takotsubo cardiomyopathy in 2016 presents to ER complaining of chest and upper back pain today that started suddenly at 9:45 AM this described as sharp causing shortness of breath and associated with some sweating.  Did not seem to be exertional however..  CBC and BMP are normal, 2 negative troponins, EKG with some inverted T waves but is similar to previous.  She did have resolution of her back pain after 3 nitroglycerin  but was still having chest pain, she was directed to the emergency department for further workup from her PCPs office.  Patient here in no distress but somewhat uncomfortable still having chest pain which she was given fentanyl  for.  On reevaluation she stopping some pain given GI cocktail, she states this improved very slightly but she still having intermittent central chest pain.  I consulted with cardiology and discussed with Dr. Mallipeddi monitor the patient was having continued chest pain and admission to the hospital for echocardiogram tomorrow due to history of low EF in the past and starting Imdur  30 mg daily empirically.  She has put the patient on her list to be seen in the hospital.  I consulted with hospitalist service and discussed with Dr. FORBES Carwin for admission.  Patient is agreeable with the admission.    Amount and/or  Complexity of Data Reviewed Labs: ordered. Decision-making details documented in ED Course. Radiology: ordered.  Risk OTC drugs. Prescription drug management. Decision regarding hospitalization.        Final diagnoses:  Chest pain, unspecified type    ED Discharge Orders     None  Suellen Sherran LABOR, PA-C 01/14/24 1722    Elnor Jayson LABOR, DO 01/21/24 [phone]

## 2024-01-15 ENCOUNTER — Observation Stay (HOSPITAL_BASED_OUTPATIENT_CLINIC_OR_DEPARTMENT_OTHER)

## 2024-01-15 DIAGNOSIS — I5181 Takotsubo syndrome: Secondary | ICD-10-CM | POA: Diagnosis not present

## 2024-01-15 DIAGNOSIS — R079 Chest pain, unspecified: Secondary | ICD-10-CM | POA: Diagnosis not present

## 2024-01-15 DIAGNOSIS — I10 Essential (primary) hypertension: Secondary | ICD-10-CM | POA: Diagnosis not present

## 2024-01-15 LAB — HIV ANTIBODY (ROUTINE TESTING W REFLEX): HIV Screen 4th Generation wRfx: NONREACTIVE

## 2024-01-15 LAB — ECHOCARDIOGRAM LIMITED
Height: 64 in
Weight: 3072.33 [oz_av]

## 2024-01-15 MED ORDER — ACETAMINOPHEN 325 MG PO TABS: 650.0000 mg | ORAL_TABLET | Freq: Four times a day (QID) | ORAL | Status: DC | PRN

## 2024-01-15 MED ORDER — SENNOSIDES-DOCUSATE SODIUM 8.6-50 MG PO TABS: 2.0000 | ORAL_TABLET | Freq: Every day | ORAL | 1 refills | Status: AC

## 2024-01-15 MED ORDER — DILTIAZEM HCL 60 MG PO TABS
90.0000 mg | ORAL_TABLET | Freq: Four times a day (QID) | ORAL | Status: DC
  Administered 2024-01-15: 90 mg via ORAL
  Filled 2024-01-15: qty 1

## 2024-01-15 MED ORDER — DILTIAZEM HCL ER COATED BEADS 120 MG PO CP24
120.0000 mg | ORAL_CAPSULE | Freq: Every day | ORAL | 11 refills | Status: AC
Start: 2024-01-15 — End: 2025-01-14

## 2024-01-15 MED ORDER — ENOXAPARIN SODIUM 40 MG/0.4ML IJ SOSY: 40.0000 mg | PREFILLED_SYRINGE | INTRAMUSCULAR | Status: DC

## 2024-01-15 NOTE — Progress Notes (Signed)
 Echocardiogram 2D Echocardiogram has been performed.  Damien FALCON Javanni Maring RDCS 01/15/2024, 10:24 AM

## 2024-01-15 NOTE — Plan of Care (Signed)

## 2024-01-15 NOTE — Consult Note (Addendum)
 CARDIOLOGY CONSULT NOTE    Patient ID: [name]; [id number]; August 27, 1960   Admit date: 01/14/2024 Date of Consult: 01/15/2024  Primary Care Provider: Dettinger, Tammy LABOR, MD Primary Cardiologist:  Primary Electrophysiologist:     History of Present Illness:   Ms. Scales is a 63 year old F known to have Takotsubo cardiomyopathy in 2016, labile HTN presented to ER with chest pain.  Patient reported sudden onset of sharp pain that started in her back, radiated to her bilateral shoulders and to her chest due to which she arrived to the ER for further evaluation.  Associated with SOB, diaphoresis and dizziness.  She took SL NTG x 3 with no relief.  Pain improved with fentanyl , morphine  in the hospital.  She continues to have mild chest discomfort substernally during my interview today.  EKG showed nonspecific ST changes in the anterior leads (old) and no new ischemia, NSR. Hs troponins within normal limits, 2 and 2.  Echo in March 2025 was unremarkable.  CT cardiac in March 2025 showed coronary calcium  score of 0 and no evidence of CAD.  Patient reported having sluggish gallbladder, neck radiation, esophagus issues (reported that she underwent EGD last year, unable to retrieve the report).  Past Medical History:  Diagnosis Date   Anxiety    Arthritis    back (12/30/2014)   Asthma    as a child, no problems as an adult, has an albuterol  inhaler   Cerebral cyst    per brain MRI 07-12-2019 unchanged 6mm cyst inferomedial right frontal lobe   Chronic headaches    Chronic systolic (congestive) heart failure (HCC)    followed by dr orlean   Complication of anesthesia    pt is very sensitive to benzodiazepines, pt stated almost died   Compression fracture of lumbar vertebra (HCC) 05/27/2020   per pt L3 and L4   DCM (dilated cardiomyopathy) (HCC)    w/ Takatsubo---  followed by dr delford---  stress-induced --- cardiac cath 12-30-2014 ef 30-35%, recovered per cardiac MRI 03-15-2015  ef  64%   Depression    Disorder of mastoid    per brain MRI 07-12-2019 persistant large mastoid effusion   Dysuria    Environmental and seasonal allergies    Frequency of urination    GERD (gastroesophageal reflux disease)    History of asthma    childhood   History of esophageal spasm    History of kidney stones    History of melanoma excision 2001   right supraorbital (right forehead and upper eyelid )  s/p  Moh's procedure w/ sln bx,  no recurrence per pt   History of non-ST elevation myocardial infarction (NSTEMI) 12/29/2014   per cath normal coronaries , ef 30-35%---  stress-- induced Takotsubo syndrome    History of palpitations 01/05/2015   STRESS INDUCED   History of parotid cancer 2008   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-2006 s/p  right lateral parotidectomy w/ nerve dissection , modified radical neck dissection ;  completed  x35 fractions raditation 2008;  no recurrence per pt   Hyperlipidemia    Hypertension    Kidney cysts    Left ureteral calculus    Low back pain    Melanoma (HCC) 2001   face   Muscle spasm of back    Myocardial infarction (HCC) 2016   Pneumonia    as a child   PONV (postoperative nausea and vomiting)    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   Squamous cell carcinoma of skin 02/22/2021   in istu- right forehead ([id number])   Takotsubo syndrome 12/29/2014   cardiologist---  dr orlean--- dx NSTEMI -- stress-induced w/ DCM--  per cath 12-30-2014 normal coronaries , ef 30-35%,  recovered ef per cardiac MRI  ef 64%   Urgency of urination    Wears glasses     Past Surgical History:  Procedure Laterality Date   BACK SURGERY     BREAST BIOPSY Bilateral early 2000's   CARDIAC CATHETERIZATION  2006 (APPROX)  [place]   NORMAL   CARDIAC CATHETERIZATION  12/30/2014   CARDIAC CATHETERIZATION N/A 12/30/2014   Procedure: Left Heart Cath and Coronary Angiography;  Surgeon: Deatrice DELENA Cage, MD;  Location: [facility];  Service: Cardiovascular;  Laterality: N/A;   CARDIOVASCULAR STRESS TEST  03-21-2012  DR DELFORD   NORMAL NUCLEAR STUDY/  NO ISCHEMIA/  EF 63%   COLONOSCOPY  10/2020   COLONOSCOPY WITH PROPOFOL   05/29/2017   CYSTOSCOPY W/ URETERAL STENT PLACEMENT Left 03/26/2013   Procedure: CYSTOSCOPY WITH RETROGRADE PYELOGRAM ;  Surgeon: Toribio Neysa Repine, MD;  Location: [facility];  Service: Urology;  Laterality: Left;   CYSTOSCOPY WITH RETROGRADE PYELOGRAM, URETEROSCOPY AND STENT PLACEMENT Bilateral 03/19/2013   Procedure: CYSTOSCOPY WITH RETROGRADE PYELOGRAM, BILATERAL URETEROSCOPY AND STENT PLACEMENT LEFT URETER,BILATERAL STONE EXTRACTION , HOLMIUM LASER LEFT URETER;  Surgeon: Toribio Neysa Repine, MD;  Location: [facility];  Service: Urology;  Laterality: Bilateral;   CYSTOSCOPY WITH STENT PLACEMENT Left 03/26/2013   Procedure: CYSTOSCOPY WITH STENT PLACEMENT;  Surgeon: Toribio Neysa Repine, MD;  Location: [facility];  Service: Urology;  Laterality: Left;   CYSTOSCOPY/URETEROSCOPY/HOLMIUM LASER/STENT PLACEMENT Left 07/07/2020   Procedure: CYSTOSCOPY LEFT URETEROSCOPY/HOLMIUM LASER/STENT PLACEMENT;  Surgeon: Carolee Sherwood JONETTA DOUGLAS, MD;  Location: [facility];  Service: Urology;  Laterality: Left;   DIAGNOSTIC LAPAROSCOPY  04/12/2009   ESOPHAGOGASTRODUODENOSCOPY (EGD) WITH PROPOFOL  N/A 02/10/2016   Procedure: ESOPHAGOGASTRODUODENOSCOPY (EGD) WITH PROPOFOL ;  Surgeon: Lamar Bunk, MD;  Location: [facility];  Service: Endoscopy;  Laterality: N/A;   KIDNEY SURGERY  1966   BILATERAL URETER'S DILATATION   KNEE ARTHROSCOPY WITH MEDIAL MENISECTOMY Left 10/03/2023   Procedure: ARTHROSCOPY, KNEE, WITH MEDIAL MENISCECTOMY;  Surgeon: Duwayne Purchase, MD;  Location: [facility];  Service: Orthopedics;  Laterality: Left;  Left knee arthroscopy, partial medial meniscectomy and debridement   LUMBAR LAMINECTOMY/DECOMPRESSION MICRODISCECTOMY  05/18/2011   Procedure:  LUMBAR LAMINECTOMY/DECOMPRESSION MICRODISCECTOMY;  Surgeon: Purchase JAYSON Duwayne;  Location: [facility];  Service: Orthopedics;  Laterality: Right;  Decompression Lumbar 4-Lumbar 5  Right    (xray)    LUMBAR LAMINECTOMY/DECOMPRESSION MICRODISCECTOMY N/A 05/06/2021   Procedure: Microlumbar decompression L5-S1;  Surgeon: Duwayne Purchase, MD;  Location: [facility];  Service: Orthopedics;  Laterality: N/A;  3 C-bed 90 mins   MELANOMA EXCISION WITH SENTINEL LYMPH NODE BIOPSY  2001   moh's procedure/  RIGHT FOREHEAD AND UPPER EYEBROW   MENISCUS DEBRIDEMENT Left 10/03/2023   Procedure: DEBRIDEMENT, MENISCUS, KNEE;  Surgeon: Duwayne Purchase, MD;  Location: [facility];  Service: Orthopedics;  Laterality: Left;   RIGHT LATERAL PAROTIDECTOMY W/ NERVE DISSECTION / RIGHT MODIFIED RADICAL NECK DISSECTION SPARING SCM ELEVENTH NERVE AND INTERNAL JUGULAR VEIN  09-12-2006  DR DWIGHT BATES   DR DWIGHT BATES; inside gland; lots of lymph nodes       Inpatient Medications: Scheduled Meds:  atorvastatin   40 mg Oral QHS   diltiazem   90 mg Oral Q6H   enoxaparin  (  LOVENOX ) injection  40 mg Subcutaneous Q24H   Continuous Infusions:  PRN Meds: acetaminophen  **OR** acetaminophen , morphine  injection, ondansetron  **OR** ondansetron  (ZOFRAN ) IV, polyethylene glycol  Allergies:    Allergies  Allergen Reactions   Cephalexin Rash and Shortness Of Breath    Keflex   Lorazepam  Anaphylaxis and Shortness Of Breath    She may be very sensitive to benzo.    Stops breathing    Promethazine      Felt strange, knocked me out   Seasonal Ic [Octacosanol] Other (See Comments)    Social History:   Social History   Socioeconomic History   Marital status: Married    Spouse name: Not on file   Number of children: Not on file   Years of education: Not on file   Highest education level: Not on file  Occupational History   Occupation: Conservation officer, nature at [employer]  Tobacco Use   Smoking status: Never   Smokeless tobacco: Never  Vaping Use    Vaping status: Never Used  Substance and Sexual Activity   Alcohol use: Not Currently    Comment: occasional- 1 drink per month   Drug use: Never   Sexual activity: Not on file  Other Topics Concern   Not on file  Social History Narrative   Right handed   Caffeine  3 cups daily   Lives one story home with basement 1 time weekly   Lives with husband and a dog   Social Drivers of Health   Financial Resource Strain: Low Risk  (01/09/2024)   Overall Financial Resource Strain (CARDIA)    Difficulty of Paying Living Expenses: Not hard at all  Food Insecurity: No Food Insecurity (01/14/2024)   Hunger Vital Sign    Worried About Running Out of Food in the Last Year: Never true    Ran Out of Food in the Last Year: Never true  Transportation Needs: No Transportation Needs (01/14/2024)   PRAPARE - Administrator, Civil Service (Medical): No    Lack of Transportation (Non-Medical): No  Physical Activity: Inactive (01/09/2024)   Exercise Vital Sign    Days of Exercise per Week: 0 days    Minutes of Exercise per Session: 0 min  Stress: No Stress Concern Present (01/09/2024)   [employer] of Occupational Health - Occupational Stress Questionnaire    Feeling of Stress: Not at all  Social Connections: Moderately Integrated (01/14/2024)   Social Connection and Isolation Panel    Frequency of Communication with Friends and Family: More than three times a week    Frequency of Social Gatherings with Friends and Family: More than three times a week    Attends Religious Services: More than 4 times per year    Active Member of [employer] or Organizations: No    Attends Banker Meetings: Never    Marital Status: Married  Catering manager Violence: Not At Risk (01/14/2024)   Humiliation, Afraid, Rape, and Kick questionnaire    Fear of Current or Ex-Partner: No    Emotionally Abused: No    Physically Abused: No    Sexually Abused: No    Family History:    Family History   Problem Relation Age of Onset   Hypertension Mother    COPD Mother    Breast cancer Mother        breast   Dementia Mother    Heart disease Father    Cancer Father        Colorectal   Hyperlipidemia Father  Hypertension Father    Colon cancer Sister      ROS:  Please see the history of present illness.  ROS  All other ROS reviewed and negative.     Physical Exam/Data:   Vitals:   01/14/24 2101 01/15/24 0023 01/15/24 0355 01/15/24 0837  BP: 116/68 (!) 126/56 (!) 142/73 (!) 162/81  Pulse: 78 84 81 100  Resp: 20 20 20    Temp: 97.8 F (36.6 C) 97.6 F (36.4 C) 97.8 F (36.6 C) 98.2 F (36.8 C)  TempSrc: Oral Oral Oral Oral  SpO2: 97% 95% 96% 97%  Weight:      Height:       No intake or output data in the 24 hours ending 01/15/24 1148 Filed Weights   01/14/24 1136 01/14/24 1724  Weight: 86.2 kg 87.1 kg   Body mass index is 32.96 kg/m.  General:  Well nourished, well developed, in no acute distress HEENT: normal Lymph: no adenopathy Neck: no JVD Endocrine:  No thryomegaly Vascular: No carotid bruits; FA pulses 2+ bilaterally without bruits  Cardiac:  normal S1, S2; RRR; no murmur  Lungs:  clear to auscultation bilaterally, no wheezing, rhonchi or rales  Abd: soft, nontender, no hepatomegaly  Ext: no edema Musculoskeletal:  No deformities, BUE and BLE strength normal and equal Skin: warm and dry  Neuro:  CNs 2-12 intact, no focal abnormalities noted Psych:  Normal affect   Laboratory Data:  Chemistry Recent Labs  Lab 01/14/24 1139  NA 136  K 3.8  CL 104  CO2 25  GLUCOSE 103*  BUN 11  CREATININE 0.82  CALCIUM  8.8*  GFRNONAA >60  ANIONGAP 7    No results for input(s): PROT, ALBUMIN , AST, ALT, ALKPHOS, BILITOT in the last 168 hours. Hematology Recent Labs  Lab 01/09/24 1433 01/14/24 1139  WBC 4.0 4.5  RBC 4.20 3.88  HGB 13.0 12.7  HCT 41.2 36.1  MCV 98* 93.0  MCH 31.0 32.7  MCHC 31.6 35.2  RDW 13.1 12.7  PLT 169 172    Cardiac EnzymesNo results for input(s): TROPONINI in the last 168 hours. No results for input(s): TROPIPOC in the last 168 hours.  BNPNo results for input(s): BNP, PROBNP in the last 168 hours.  DDimer No results for input(s): DDIMER in the last 168 hours.  Radiology/Studies:  CT Angio Chest Aorta W and/or Wo Contrast Result Date: 01/14/2024 CLINICAL DATA:  Chest pain radiating to the back EXAM: CT ANGIOGRAPHY CHEST WITH CONTRAST TECHNIQUE: Multidetector CT imaging of the chest was performed using the standard protocol during bolus administration of intravenous contrast. Multiplanar CT image reconstructions and MIPs were obtained to evaluate the vascular anatomy. RADIATION DOSE REDUCTION: This exam was performed according to the departmental dose-optimization program which includes automated exposure control, adjustment of the mA and/or kV according to patient size and/or use of iterative reconstruction technique. CONTRAST:  75mL OMNIPAQUE  IOHEXOL  350 MG/ML SOLN COMPARISON:  Cardiac CT dated 09/05/2023, CTA chest dated 08/01/2023 FINDINGS: Cardiovascular: Preferential opacification of the thoracic aorta. No evidence of thoracic aortic aneurysm or dissection. Normal heart size. No pericardial effusion. No central pulmonary embolism. Aortic atherosclerosis. Mediastinum/Nodes: Imaged thyroid  gland without nodules meeting criteria for imaging follow-up by size. Normal esophagus. No pathologically enlarged axillary, supraclavicular, mediastinal, or hilar lymph nodes. Lungs/Pleura: The central airways are patent. No focal consolidation. No pneumothorax. No pleural effusion. Upper abdomen: Normal. Musculoskeletal: No acute or abnormal lytic or blastic osseous lesions. Multilevel degenerative changes of the thoracic spine. Review of the MIP images  confirms the above findings. IMPRESSION: 1. No evidence of thoracic aortic aneurysm or dissection. 2. No acute intrathoracic abnormality. 3.  Aortic  Atherosclerosis (ICD10-I70.0). Electronically Signed   By: Limin  Xu M.D.   On: 01/14/2024 15:15   DG Chest 2 View Result Date: 01/14/2024 CLINICAL DATA:  Back pain and chest pain x3 hours. EXAM: CHEST - 2 VIEW COMPARISON:  August 01, 2023 FINDINGS: The heart size and mediastinal contours are within normal limits. Low lung volumes are noted. No acute infiltrate, pleural effusion or pneumothorax is identified. A solitary surgical clip versus biopsy clip is seen within the soft tissues of the left breast. The visualized skeletal structures are unremarkable. IMPRESSION: No active cardiopulmonary disease. Electronically Signed   By: Suzen Dials M.D.   On: 01/14/2024 13:23    Assessment and Plan:    Chest pain - Presented with sudden onset of pain in her back that radiated to her bilateral shoulders and left side of her chest x yesterday.  Associated with SOB, diaphoresis, dizziness.  Continues to have mild substernal chest discomfort during my interview today. - EKG showed NSR, nonspecific T wave inversions in the anterior leads (old) and no new ischemia. Hs troponins within normal limits, 2 and 2. - CT cardiac in March 2025 showed: Calcium  score of 0 and no evidence of CAD.  Echocardiogram in March 25 showed normal LVEF, normal RV function and no evidence of valvular heart disease.  Echocardiogram his admission is pending, may need to rule out Takotsubo as she had prior history of the same. - Although there is no CAD, will need to rule out coronary microvascular dysfunction. She did not have any relief of these chest pains with SL NTG x 3 prior to admission, started her on Imdur  30 mg yesterday, she received 2 doses so far with no relief in chest pains on the top of that, she had headaches.  Will discontinue Imdur  and start diltiazem  90 mg every 6 hours.  If she continues to have no relief with the above medication, she likely has noncardiac chest pains.   HTN, poorly controlled - Her blood  pressures always has been normal when she was in doctors office.  She never checks her blood pressures at home.  Strongly encouraged her to check blood pressures at home. - In addition, her home antihypertensive medications are not resumed this admission.  Will start with diltiazem  90 mg every 6 hours and resume her home BP meds depending on her blood pressure control.   Takotsubo cardiomyopathy in 2016 -On GDMT at home, resume GDMT.   For questions or updates, please contact [facility] Please consult [email or web address] for contact info under Cardiology/STEMI.   Signed, Greg Cratty Priya Paytin Ramakrishnan, MD 01/15/2024 11:48 AM

## 2024-01-15 NOTE — Progress Notes (Signed)
 Patient was discharged home with instructions given on medications and follow up visits,verbalized understanding. Prescriptions sent to Pharmacy of choice documented on AVS. IV discontinued,catheter intact. Accompanied by staff to an awaiting vehicle.[name]

## 2024-01-15 NOTE — Plan of Care (Signed)
   Problem: Education: Goal: Knowledge of General Education information will improve Description Including pain rating scale, medication(s)/side effects and non-pharmacologic comfort measures Outcome: Progressing   Problem: Education: Goal: Knowledge of General Education information will improve Description Including pain rating scale, medication(s)/side effects and non-pharmacologic comfort measures Outcome: Progressing

## 2024-01-15 NOTE — Progress Notes (Signed)
 Nurse at bedside,patient alert and oriented times four.Patient had c/o sternum pain rated a 6,a dull constant pain with some nausea this am,Dr Courage notified.Plan of care on going.

## 2024-01-15 NOTE — Discharge Instructions (Addendum)
 1)Please Follow up with Gastroenterologist Dr. Eartha-- for possible Repeat EGD (Upper Endoscopy) at address [address], [address], [place] [address],,[place] Number [phone]     2)Please Note that there has been changes to your medications---  3)Low calorie diet, portion control and increase physical activity discussed with patient -Body mass index (BMII) is 32.96 kg/m.

## 2024-01-15 NOTE — TOC CM/SW Note (Signed)
 Transition of Care [facility]) - Inpatient Brief Assessment   Patient Details  Name: [name] MRN: [id number] Date of Birth: [birth year]  Transition of Care [facility]) CM/SW Contact:    Lucie Lunger, LCSWA Phone Number: 01/15/2024, 10:23 AM   Clinical Narrative: Transition of Care Department [facility]) has reviewed patient and no TOC needs have been identified at this time. We will continue to monitor patient advancement through interdiciplinary progression rounds. If new patient transition needs arise, please place a TOC consult.   Transition of Care Asessment: Insurance and Status: Insurance coverage has been reviewed Patient has primary care physician: Yes Home environment has been reviewed: From home Prior level of function:: Independent Prior/Current Home Services: No current home services Social Drivers of Health Review: SDOH reviewed no interventions necessary Readmission risk has been reviewed: Yes Transition of care needs: no transition of care needs at this time

## 2024-01-15 NOTE — Discharge Summary (Signed)
 [name], is a 63 y.o. female  DOB 02-06-1961  MRN [id number].  Admission date:  01/14/2024  Admitting Physician  Tully FORBES Carwin, MD  Discharge Date:  01/15/2024   Primary MD  Dettinger, Fonda LABOR, MD  Recommendations for primary care physician for things to follow:   1)Please Follow up with Gastroenterologist Dr. Eartha-- for possible Repeat EGD (Upper Endoscopy) at address [address], [place] [address],,[place] Number [phone]     2)Please Note that there has been changes to your medications---  3)Low calorie diet, portion control and increase physical activity discussed with patient -Body mass index (BMII) is 32.96 kg/m. Admission Diagnosis  Chest pain [R07.9] Chest pain, unspecified type [R07.9]   Discharge Diagnosis  Chest pain [R07.9] Chest pain, unspecified type [R07.9]    Principal Problem:   Chest pain Active Problems:   Cardiomyopathy (HCC)   Essential (primary) hypertension      Past Medical History:  Diagnosis Date   Anxiety    Arthritis    back (12/30/2014)   Asthma    as a child, no problems as an adult, has an albuterol  inhaler   Cerebral cyst    per brain MRI 07-12-2019 unchanged 6mm cyst inferomedial right frontal lobe   Chronic headaches    Chronic systolic (congestive) heart failure (HCC)    followed by dr orlean   Complication of anesthesia    pt is very sensitive to benzodiazepines, pt stated almost died   Compression fracture of lumbar vertebra (HCC) 05/27/2020   per pt L3 and L4   DCM (dilated cardiomyopathy) (HCC)    w/ Takatsubo---  followed by dr delford---  stress-induced --- cardiac cath 12-30-2014 ef 30-35%, recovered per cardiac MRI 03-15-2015 ef  64%   Depression    Disorder of mastoid    per brain MRI 07-12-2019 persistant large mastoid effusion   Dysuria    Environmental and seasonal allergies    Frequency of  urination    GERD (gastroesophageal reflux disease)    History of asthma    childhood   History of esophageal spasm    History of kidney stones    History of melanoma excision 2001   right supraorbital (right forehead and upper eyelid )  s/p  Moh's procedure w/ sln bx,  no recurrence per pt   History of non-ST elevation myocardial infarction (NSTEMI) 12/29/2014   per cath normal coronaries , ef 30-35%---  stress-- induced Takotsubo syndrome    History of palpitations 01/05/2015   STRESS INDUCED   History of parotid cancer 2008   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-2006 s/p  right lateral parotidectomy w/ nerve dissection , modified radical neck dissection ;  completed  x35 fractions raditation 2008;  no recurrence per pt   Hyperlipidemia    Hypertension    Kidney cysts    Left ureteral calculus    Low back pain    Melanoma (HCC) 2001   face   Muscle spasm of back  Myocardial infarction [facility]) 2016   Pneumonia    as a child   PONV (postoperative nausea and vomiting)    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   Squamous cell carcinoma of skin 02/22/2021   in istu- right forehead ([id number])   Takotsubo syndrome 12/29/2014   cardiologist---  dr orlean--- dx NSTEMI -- stress-induced w/ DCM--  per cath 12-30-2014 normal coronaries , ef 30-35%,  recovered ef per cardiac MRI  ef 64%   Urgency of urination    Wears glasses     Past Surgical History:  Procedure Laterality Date   BACK SURGERY     BREAST BIOPSY Bilateral early 2000's   CARDIAC CATHETERIZATION  2006 (APPROX)  [place]   NORMAL   CARDIAC CATHETERIZATION  12/30/2014   CARDIAC CATHETERIZATION N/A 12/30/2014   Procedure: Left Heart Cath and Coronary Angiography;  Surgeon: Deatrice DELENA Cage, MD;  Location: [facility];  Service: Cardiovascular;  Laterality: N/A;   CARDIOVASCULAR STRESS TEST  03-21-2012  DR DELFORD   NORMAL NUCLEAR STUDY/  NO ISCHEMIA/  EF 63%   COLONOSCOPY   10/2020   COLONOSCOPY WITH PROPOFOL   05/29/2017   CYSTOSCOPY W/ URETERAL STENT PLACEMENT Left 03/26/2013   Procedure: CYSTOSCOPY WITH RETROGRADE PYELOGRAM ;  Surgeon: Toribio Neysa Repine, MD;  Location: [facility];  Service: Urology;  Laterality: Left;   CYSTOSCOPY WITH RETROGRADE PYELOGRAM, URETEROSCOPY AND STENT PLACEMENT Bilateral 03/19/2013   Procedure: CYSTOSCOPY WITH RETROGRADE PYELOGRAM, BILATERAL URETEROSCOPY AND STENT PLACEMENT LEFT URETER,BILATERAL STONE EXTRACTION , HOLMIUM LASER LEFT URETER;  Surgeon: Toribio Neysa Repine, MD;  Location: [facility];  Service: Urology;  Laterality: Bilateral;   CYSTOSCOPY WITH STENT PLACEMENT Left 03/26/2013   Procedure: CYSTOSCOPY WITH STENT PLACEMENT;  Surgeon: Toribio Neysa Repine, MD;  Location: [facility];  Service: Urology;  Laterality: Left;   CYSTOSCOPY/URETEROSCOPY/HOLMIUM LASER/STENT PLACEMENT Left 07/07/2020   Procedure: CYSTOSCOPY LEFT URETEROSCOPY/HOLMIUM LASER/STENT PLACEMENT;  Surgeon: Carolee Sherwood JONETTA DOUGLAS, MD;  Location: [facility];  Service: Urology;  Laterality: Left;   DIAGNOSTIC LAPAROSCOPY  04/12/2009   ESOPHAGOGASTRODUODENOSCOPY (EGD) WITH PROPOFOL  N/A 02/10/2016   Procedure: ESOPHAGOGASTRODUODENOSCOPY (EGD) WITH PROPOFOL ;  Surgeon: Lamar Bunk, MD;  Location: [facility];  Service: Endoscopy;  Laterality: N/A;   KIDNEY SURGERY  1966   BILATERAL URETER'S DILATATION   KNEE ARTHROSCOPY WITH MEDIAL MENISECTOMY Left 10/03/2023   Procedure: ARTHROSCOPY, KNEE, WITH MEDIAL MENISCECTOMY;  Surgeon: Duwayne Purchase, MD;  Location: [facility];  Service: Orthopedics;  Laterality: Left;  Left knee arthroscopy, partial medial meniscectomy and debridement   LUMBAR LAMINECTOMY/DECOMPRESSION MICRODISCECTOMY  05/18/2011   Procedure: LUMBAR LAMINECTOMY/DECOMPRESSION MICRODISCECTOMY;  Surgeon: Purchase JAYSON Duwayne;  Location: [facility];  Service: Orthopedics;  Laterality: Right;  Decompression Lumbar 4-Lumbar 5   Right    (xray)    LUMBAR LAMINECTOMY/DECOMPRESSION MICRODISCECTOMY N/A 05/06/2021   Procedure: Microlumbar decompression L5-S1;  Surgeon: Duwayne Purchase, MD;  Location: [facility];  Service: Orthopedics;  Laterality: N/A;  3 C-bed 90 mins   MELANOMA EXCISION WITH SENTINEL LYMPH NODE BIOPSY  2001   moh's procedure/  RIGHT FOREHEAD AND UPPER EYEBROW   MENISCUS DEBRIDEMENT Left 10/03/2023   Procedure: DEBRIDEMENT, MENISCUS, KNEE;  Surgeon: Duwayne Purchase, MD;  Location: [facility];  Service: Orthopedics;  Laterality: Left;   RIGHT LATERAL PAROTIDECTOMY W/ NERVE DISSECTION / RIGHT MODIFIED RADICAL NECK DISSECTION SPARING SCM ELEVENTH NERVE AND INTERNAL JUGULAR VEIN  09-12-2006  DR DWIGHT BATES   DR DWIGHT BATES; inside gland; lots of  lymph nodes     HPI  from the history and physical done on the day of admission:    Chief Complaint: Chest pain   HPI: [name] is a 63 y.o. female with medical history significant for Takotsubo cardiomyopathy, hypertension. Patient presented to the ED with complaints of sudden onset of sharp left-sided chest pain.  She reports onset of sharp pains in her back radiating to her left chest started while she was getting ready for work today. Pain is worse with movement of her arms.  She reports onset of diaphoresis, difficulty breathing when chest pain started.  No relief with 3 nitro she took at home.  Improvement in pain with fentanyl  given in ED.  No known relieving factors.  Denies prior chest pain or difficulty breathing even with activity prior to today.     ED Course: Stable vitals.  WBC 4.5.  Troponin 2 x 2.   GI cocktail, fentanyl  given in ED. patient tells me fentanyl  worked the most. Chest xray- negative for acute abnormality.   CTA chest negative for PE or other acute abnormality. EDP talked to cardiology Dr. Mallipeddi-.Imdur  30 mg daily, Echo AM, cardiology to see in consult.   Review of Systems: As per HPI all other systems reviewed and negative.    Hospital Course:   Assessment and Plan: 1)Chest pain Atypical chest pains-worse with arm movement, no improvement with nitro and GI cocktail.  Troponin 2  X 2.  EKG with nonspecific changes to V5 and V6.  Non-smoker.  History of Takotsubo cardiomyopathy.  Recent CT coronary 09/05/2023-no evidence of coronary artery disease.  Recent echo 08/2023, EF of 60 to 65%, grade 1 DD. -Cardiology consult appreciated, per cardiologist chest pain is likely noncardiac --Patient did not have significant relief with Nitro/or isosorbide  -Cardiologist recommends Cardizem , atorvastatin  and Coreg  - EDP talked to Dr. Donold Imdur  30 mg daily, and echo -Repeat updated echo from 01/15/2024 showed EF of 60 to 65%,  2)HTN Stable.  -Continue PTA lisinopril  and Coreg , cardiologist added Cardizem  as above #1  Discharge Condition: stable  Follow UP   Follow-up Information     Eartha Angelia Sieving, MD. Schedule an appointment as soon as possible for a visit.   Specialty: Gastroenterology Why: For possible repeat upper endoscopy Contact information: [address] [place] [address] [phone]                 Consults obtained -cardiologist  Diet and Activity recommendation:  As advised  Discharge Instructions    Discharge Instructions     Call MD for:  temperature >100.4   Complete by: As directed    Diet - low sodium heart healthy   Complete by: As directed    Discharge instructions   Complete by: As directed    1)Please Follow up with Gastroenterologist Dr. Eartha-- for possible Repeat EGD (Upper Endoscopy) at address [address], [place] [address],,[place] Number [phone]     2)Please Note that there has been changes to your medications---  3)Low calorie diet, portion control and increase physical activity discussed with patient -Body mass index (BMII) is 32.96 kg/m.   Increase activity slowly   Complete by: As directed         Discharge Medications     Allergies as of 01/15/2024       Reactions   Cephalexin Rash, Shortness Of Breath   Keflex   Lorazepam  Anaphylaxis, Shortness Of Breath   She may  be very sensitive to benzo.    Stops breathing    Promethazine     Felt strange, knocked me out   Seasonal Ic [octacosanol] Other (See Comments)        Medication List     STOP taking these medications    docusate sodium  100 MG capsule Commonly known as: Colace       TAKE these medications    acetaminophen  325 MG tablet Commonly known as: TYLENOL  Take 2 tablets (650 mg total) by mouth every 6 (six) hours as needed for mild pain (pain score 1-3) or fever (or Fever >/= 101).   aspirin -acetaminophen -caffeine  250-250-65 MG tablet Commonly known as: EXCEDRIN  MIGRAINE Take 2 tablets by mouth daily as needed for migraine or headache.   atorvastatin  40 MG tablet Commonly known as: LIPITOR  Take 1 tablet (40 mg total) by mouth daily. What changed: when to take this   calcium  carbonate 500 MG chewable tablet Commonly known as: TUMS - dosed in mg elemental calcium  Chew 1,000 mg by mouth daily as needed for indigestion or heartburn.   carvedilol  3.125 MG tablet Commonly known as: COREG  Take 1 tablet (3.125 mg total) by mouth 2 (two) times daily with a meal.   cholecalciferol  25 MCG (1000 UNIT) tablet Commonly known as: VITAMIN D3 Take 1,000 Units by mouth daily.   diltiazem  120 MG 24 hr capsule Commonly known as: Cardizem  CD Take 1 capsule (120 mg total) by mouth daily.   estradiol  0.1 MG/GM vaginal cream Commonly known as: ESTRACE  Discard plastic applicator. Insert a blueberry size amount (approximately 1 gram) of cream on fingertip inside vagina at bedtime every night for 1 week then every other night. For long term use. What changed:  how much to take how to take this when to take this   lisinopril  2.5 MG tablet Commonly known as: ZESTRIL  Take 1 tablet (2.5 mg total) by mouth every  morning.   milk thistle 175 MG tablet Take 175 mg by mouth daily.   nitroGLYCERIN  0.4 MG SL tablet Commonly known as: NITROSTAT  Place 1 tablet (0.4 mg total) under the tongue every 5 (five) minutes as needed for chest pain. DISSOLVE ONE TABLET UNDER THE TONGUE EVERY 5 MINUTES AS NEEDED FOR CHEST PAIN.  DO NOT EXCEED A TOTAL OF 3 DOSES IN 15 MINUTES   nortriptyline  10 MG capsule Commonly known as: PAMELOR  Take 3 capsules (30 mg total) by mouth at bedtime.   oxyCODONE  5 MG immediate release tablet Commonly known as: Oxy IR/ROXICODONE  Take 1 tablet (5 mg total) by mouth every 4 (four) hours as needed for severe pain (pain score 7-10).   senna-docusate 8.6-50 MG tablet Commonly known as: Senokot-S Take 2 tablets by mouth at bedtime.   vitamin B-12 500 MCG tablet Commonly known as: CYANOCOBALAMIN Take 500 mcg by mouth daily.       Major procedures and Radiology Reports - PLEASE review detailed and final reports for all details, in brief -   ECHOCARDIOGRAM LIMITED Result Date: 01/15/2024    ECHOCARDIOGRAM LIMITED REPORT   Patient Name:   [name] Date of Exam: 01/15/2024 Medical Rec #:  [id number]       Height:       64.0 in Accession #:    [id number]      Weight:       192.0 lb Date of Birth:  [birth year]       BSA:          1.923 m Patient Age:  63 years        BP:           142/73 mmHg Patient Gender: F               HR:           96 bpm. Exam Location:  [facility] Procedure: Limited Echo, Color Doppler and Cardiac Doppler (Both Spectral and            Color Flow Doppler were utilized during procedure). Indications:    R07.9* Chest pain, unspecified  History:        Patient has prior history of Echocardiogram examinations, most                 recent 09/13/2023. History of Takotsubo, CAD; Risk                 Factors:Hypertension and Dyslipidemia.  Sonographer:    Damien Senior RDCS Referring Phys: [id number] TULLY FORBES CARWIN  Sonographer Comments: Limited for chest pain IMPRESSIONS  1.  Limited Echocardiogram to assess LV function.  2. Left ventricular ejection fraction, by estimation, is 60 to 65%. The left ventricle has normal function. Left ventricular endocardial border not optimally defined to evaluate regional wall motion. Left ventricular diastolic function could not be evaluated.  3. Right ventricular systolic function was not well visualized. The right ventricular size is normal. Tricuspid regurgitation signal is inadequate for assessing PA pressure.  4. The inferior vena cava is dilated but collapsibility could not be evaluated. FINDINGS  Left Ventricle: Left ventricular ejection fraction, by estimation, is 60 to 65%. The left ventricle has normal function. Left ventricular endocardial border not optimally defined to evaluate regional wall motion. The left ventricular internal cavity size was normal in size. There is no left ventricular hypertrophy. Left ventricular diastolic function could not be evaluated. Right Ventricle: The right ventricular size is normal. Right vetricular wall thickness was not well visualized. Right ventricular systolic function was not well visualized. Tricuspid regurgitation signal is inadequate for assessing PA pressure. Left Atrium: Left atrial size was not assessed. Right Atrium: Right atrial size was not assessed. Pericardium: There is no evidence of pericardial effusion. Mitral Valve: The mitral valve is grossly normal. Tricuspid Valve: The tricuspid valve is not well visualized. Aortic Valve: The aortic valve was not assessed. Pulmonic Valve: The pulmonic valve was not well visualized. Aorta: The aortic root is normal in size and structure. Venous: The inferior vena cava is dilated but collapsibility could not be evaluated. IAS/Shunts: No atrial level shunt detected by color flow Doppler. Additional Comments: Spectral Doppler performed. Color Doppler performed.  RIGHT VENTRICLE RV S prime:     12.50 cm/s TAPSE (M-mode): 1.5 cm Vishnu Priya Mallipeddi  Electronically signed by Diannah Late Mallipeddi Signature Date/Time: 01/15/2024/1:08:21 PM    Final    CT Angio Chest Aorta W and/or Wo Contrast Result Date: 01/14/2024 CLINICAL DATA:  Chest pain radiating to the back EXAM: CT ANGIOGRAPHY CHEST WITH CONTRAST TECHNIQUE: Multidetector CT imaging of the chest was performed using the standard protocol during bolus administration of intravenous contrast. Multiplanar CT image reconstructions and MIPs were obtained to evaluate the vascular anatomy. RADIATION DOSE REDUCTION: This exam was performed according to the departmental dose-optimization program which includes automated exposure control, adjustment of the mA and/or kV according to patient size and/or use of iterative reconstruction technique. CONTRAST:  75mL OMNIPAQUE  IOHEXOL  350 MG/ML SOLN COMPARISON:  Cardiac CT dated 09/05/2023, CTA chest dated 08/01/2023 FINDINGS: Cardiovascular: Preferential opacification  of the thoracic aorta. No evidence of thoracic aortic aneurysm or dissection. Normal heart size. No pericardial effusion. No central pulmonary embolism. Aortic atherosclerosis. Mediastinum/Nodes: Imaged thyroid  gland without nodules meeting criteria for imaging follow-up by size. Normal esophagus. No pathologically enlarged axillary, supraclavicular, mediastinal, or hilar lymph nodes. Lungs/Pleura: The central airways are patent. No focal consolidation. No pneumothorax. No pleural effusion. Upper abdomen: Normal. Musculoskeletal: No acute or abnormal lytic or blastic osseous lesions. Multilevel degenerative changes of the thoracic spine. Review of the MIP images confirms the above findings. IMPRESSION: 1. No evidence of thoracic aortic aneurysm or dissection. 2. No acute intrathoracic abnormality. 3.  Aortic Atherosclerosis (ICD10-I70.0). Electronically Signed   By: Limin  Xu M.D.   On: 01/14/2024 15:15   DG Chest 2 View Result Date: 01/14/2024 CLINICAL DATA:  Back pain and chest pain x3 hours. EXAM:  CHEST - 2 VIEW COMPARISON:  August 01, 2023 FINDINGS: The heart size and mediastinal contours are within normal limits. Low lung volumes are noted. No acute infiltrate, pleural effusion or pneumothorax is identified. A solitary surgical clip versus biopsy clip is seen within the soft tissues of the left breast. The visualized skeletal structures are unremarkable. IMPRESSION: No active cardiopulmonary disease. Electronically Signed   By: Suzen Dials M.D.   On: 01/14/2024 13:23   Today   Subjective    [name] today has no new complaints - No further chest pains - No productive cough No fever  Or chills   No Nausea, Vomiting or Diarrhea         Patient has been seen and examined prior to discharge   Objective   Blood pressure 120/75, pulse (!) 103, temperature 98.4 F (36.9 C), temperature source Oral, resp. rate 20, height 5' 4 (1.626 m), weight 87.1 kg, last menstrual period 05/18/2011, SpO2 94%.   Intake/Output Summary (Last 24 hours) at 01/15/2024 1540 Last data filed at 01/15/2024 1255 Gross per 24 hour  Intake 240 ml  Output --  Net 240 ml    Exam Gen:- Awake Alert, no acute distress  HEENT:- [place].AT, No sclera icterus Neck-Supple Neck,No JVD,.  Lungs-  CTAB , good air movement bilaterally CV- S1, S2 normal, regular Abd-  +ve B.Sounds, Abd Soft, No tenderness,    Extremity/Skin:- No  edema,   good pulses Psych-affect is appropriate, oriented x3 Neuro-no new focal deficits, no tremors    Data Review   CBC w Diff:  Lab Results  Component Value Date   WBC 4.5 01/14/2024   HGB 12.7 01/14/2024   HGB 13.0 01/09/2024   HGB 12.9 04/06/2008   HCT 36.1 01/14/2024   HCT 41.2 01/09/2024   HCT 36.0 04/06/2008   PLT 172 01/14/2024   PLT 169 01/09/2024   LYMPHOPCT 26 08/01/2023   LYMPHOPCT 27.6 04/06/2008   MONOPCT 9 08/01/2023   MONOPCT 9.5 04/06/2008   EOSPCT 2 08/01/2023   EOSPCT 1.5 04/06/2008   BASOPCT 0 08/01/2023   BASOPCT 0.4 04/06/2008   CMP:   Lab Results  Component Value Date   NA 136 01/14/2024   NA 140 10/12/2023   K 3.8 01/14/2024   CL 104 01/14/2024   CO2 25 01/14/2024   BUN 11 01/14/2024   BUN 13 10/12/2023   CREATININE 0.82 01/14/2024   PROT 6.6 10/12/2023   ALBUMIN  4.4 10/12/2023   BILITOT 0.5 10/12/2023   ALKPHOS 92 10/12/2023   AST 25 10/12/2023   ALT 28 10/12/2023   Total Discharge time is about 33 minutes  Rendall Carwin M.D on 01/15/2024 at 3:40 PM  Go to [email or web address] -  for contact info  [facility] - Office  [phone]

## 2024-01-16 ENCOUNTER — Ambulatory Visit
Admission: RE | Admit: 2024-01-16 | Discharge: 2024-01-16 | Disposition: A | Discharge status: Home / Self Care | Source: Ambulatory Visit | Attending: Family Medicine | Admitting: Family Medicine

## 2024-01-16 ENCOUNTER — Other Ambulatory Visit: Payer: Self-pay | Admitting: Family Medicine

## 2024-01-16 ENCOUNTER — Telehealth: Payer: Self-pay

## 2024-01-16 DIAGNOSIS — Z1231 Encounter for screening mammogram for malignant neoplasm of breast: Secondary | ICD-10-CM

## 2024-01-16 NOTE — Telephone Encounter (Signed)
 Left detailed message for patient to call back and confirm if she was just seen in the ER or if she was admitted to the hospital. If it was just an ER visit, then the appt can be scheduled as a 15 minute OV but if she was admitted to the hospital then it needs to either be a 30 minute Hospital Follow up OR a 30 minute acute visit with PCP or DOD.

## 2024-01-16 NOTE — Telephone Encounter (Signed)
 Copied from CRM [id number]. Topic: Appointments - Appointment Scheduling >> Jan 16, 2024  9:34 AM Emylou G wrote: F/u from hospital - chest pain.. can she be seen by someone else?  Her PCP doesn't have an appt soon enough ( but prefers her PCP??) Only available T, W, Th

## 2024-01-17 ENCOUNTER — Ambulatory Visit: Payer: Self-pay | Admitting: Family Medicine

## 2024-01-17 ENCOUNTER — Telehealth: Payer: Self-pay

## 2024-01-17 NOTE — Telephone Encounter (Signed)
 Pt aware DEXA has not been read by radiology and once reviewed by radiology and provider we will call with results.

## 2024-01-17 NOTE — Telephone Encounter (Signed)
 Copied from CRM [id number]. Topic: Clinical - Request for Lab/Test Order >> Jan 17, 2024  4:07 PM Turkey B wrote: Reason for CRM: I gave blood panel lab results. patient needs bone density scan results. Please cb

## 2024-01-22 ENCOUNTER — Other Ambulatory Visit: Payer: Self-pay | Admitting: Family Medicine

## 2024-01-22 DIAGNOSIS — R928 Other abnormal and inconclusive findings on diagnostic imaging of breast: Secondary | ICD-10-CM

## 2024-01-23 ENCOUNTER — Ambulatory Visit (INDEPENDENT_AMBULATORY_CARE_PROVIDER_SITE_OTHER): Admitting: Family Medicine

## 2024-01-23 ENCOUNTER — Telehealth: Payer: Self-pay

## 2024-01-23 ENCOUNTER — Ambulatory Visit
Admission: RE | Admit: 2024-01-23 | Discharge: 2024-01-23 | Disposition: A | Discharge status: Home / Self Care | Source: Ambulatory Visit | Attending: Family Medicine | Admitting: Family Medicine

## 2024-01-23 ENCOUNTER — Encounter: Payer: Self-pay | Admitting: Family Medicine

## 2024-01-23 VITALS — BP 139/82 | HR 88 | Ht 64.0 in | Wt 190.0 lb

## 2024-01-23 DIAGNOSIS — K219 Gastro-esophageal reflux disease without esophagitis: Secondary | ICD-10-CM | POA: Diagnosis not present

## 2024-01-23 DIAGNOSIS — K5900 Constipation, unspecified: Secondary | ICD-10-CM

## 2024-01-23 DIAGNOSIS — R928 Other abnormal and inconclusive findings on diagnostic imaging of breast: Secondary | ICD-10-CM

## 2024-01-23 DIAGNOSIS — K224 Dyskinesia of esophagus: Secondary | ICD-10-CM

## 2024-01-23 MED ORDER — ESOMEPRAZOLE MAGNESIUM 40 MG PO CPDR: 40.0000 mg | DELAYED_RELEASE_CAPSULE | Freq: Every day | ORAL | 3 refills | Status: DC

## 2024-01-23 NOTE — Telephone Encounter (Signed)
 Copied from CRM [id number]. Topic: Clinical - Prescription Issue >> Jan 23, 2024 11:38 AM Nathanel BROCKS wrote: Reason for CRM: esomeprazole  (NEXIUM ) 40 MG capsule   Please resend this rx to [facility] pharmacy, pt said she does not use the pharmacy it was sent to

## 2024-01-23 NOTE — Telephone Encounter (Signed)
 Resent to [facility] in [place].

## 2024-01-23 NOTE — Progress Notes (Signed)
 BP 139/82   Pulse 88   Ht 5' 4 (1.626 m)   Wt 190 lb (86.2 kg)   LMP 05/18/2011   SpO2 100%   BMI 32.61 kg/m    Subjective:   Patient ID: [name], female    DOB: 10/16/1960, 63 y.o.   MRN: [id number]  HPI: [name] is a 63 y.o. female presenting on 01/23/2024 for Hospitalization Follow-up   Discussed the use of AI scribe software for clinical note transcription with the patient, who gave verbal consent to proceed.  History of Present Illness   [name] is a 63 year old female with Takotsubo cardiomyopathy who presents for ER follow-up after experiencing chest pain.  She experienced sharp, left-sided chest pain radiating to her back, exacerbated by arm movement, along with dyspnea and pleuritic pain. She took three nitroglycerin  tablets at home without relief and received fentanyl  in the emergency department, which alleviated her symptoms. A GI cocktail was also administered. Two troponin tests were negative, and she was diagnosed with atypical chest pain. She reports that during her hospital visit, she could not tell her why her blood pressure was unstable, and she continues to experience intermittent chest pain, with severe episodes occurring almost every other day, primarily located in her sternum.  She has a history of Takotsubo cardiomyopathy and was initially recommended to start Imdur , but due to severe headaches, the medication was switched to diltiazem  120 mg. She has undergone endoscopies and swallow tests in the past, but no definitive cause for her symptoms has been identified.  She experiences constipation, requiring the use of Senokot, which has not been effective. She has bloating and has lost 11 pounds despite normal eating habits. She has a history of regular colonoscopies and is currently seeing a new gastroenterologist.  She has a family history of breast cancer, as her mother had the condition. She recently had a mammogram that showed  calcifications in the right breast, prompting a recommendation for a diagnostic mammogram. She is concerned about the findings due to her family history.  She feels tired, attributing it to multiple ongoing health issues, including her heart condition and constipation. She sleeps well at night but is concerned about her overall energy levels.          Relevant past medical, surgical, family and social history reviewed and updated as indicated. Interim medical history since our last visit reviewed. Allergies and medications reviewed and updated.  Review of Systems  Constitutional:  Negative for chills and fever.  Eyes:  Negative for visual disturbance.  Respiratory:  Negative for chest tightness and shortness of breath.   Cardiovascular:  Positive for chest pain. Negative for leg swelling.  Gastrointestinal:  Positive for abdominal pain and constipation. Negative for diarrhea, nausea and vomiting.  Genitourinary:  Negative for difficulty urinating and dysuria.  Musculoskeletal:  Negative for back pain and gait problem.  Skin:  Negative for rash.  Neurological:  Negative for light-headedness and headaches.  Psychiatric/Behavioral:  Negative for agitation and behavioral problems.   All other systems reviewed and are negative.   Per HPI unless specifically indicated above   Allergies as of 01/23/2024       Reactions   Cephalexin Rash, Shortness Of Breath   Keflex   Lorazepam  Anaphylaxis, Shortness Of Breath   She may be very sensitive to benzo.    Stops breathing    Promethazine     Felt strange, knocked me out   Seasonal Ic [octacosanol] Other (  See Comments)        Medication List        Accurate as of January 23, 2024 11:25 AM. If you have any questions, ask your nurse or doctor.          STOP taking these medications    aspirin -acetaminophen -caffeine  250-250-65 MG tablet Commonly known as: EXCEDRIN  MIGRAINE Stopped by: [name]       TAKE these  medications    acetaminophen  650 MG CR tablet Commonly known as: TYLENOL  Take 650 mg by mouth every 8 (eight) hours as needed for pain. What changed: Another medication with the same name was removed. Continue taking this medication, and follow the directions you see here. Changed by: [name]   atorvastatin  40 MG tablet Commonly known as: LIPITOR  Take 1 tablet (40 mg total) by mouth daily. What changed: when to take this   calcium  carbonate 500 MG chewable tablet Commonly known as: TUMS - dosed in mg elemental calcium  Chew 1,000 mg by mouth daily as needed for indigestion or heartburn.   carvedilol  3.125 MG tablet Commonly known as: COREG  Take 1 tablet (3.125 mg total) by mouth 2 (two) times daily with a meal.   cholecalciferol  25 MCG (1000 UNIT) tablet Commonly known as: VITAMIN D3 Take 1,000 Units by mouth daily.   diltiazem  120 MG 24 hr capsule Commonly known as: Cardizem  CD Take 1 capsule (120 mg total) by mouth daily.   esomeprazole  40 MG capsule Commonly known as: NexIUM  Take 1 capsule (40 mg total) by mouth daily. Started by: [name]   estradiol  0.1 MG/GM vaginal cream Commonly known as: ESTRACE  Discard plastic applicator. Insert a blueberry size amount (approximately 1 gram) of cream on fingertip inside vagina at bedtime every night for 1 week then every other night. For long term use. What changed:  how much to take how to take this when to take this   lisinopril  2.5 MG tablet Commonly known as: ZESTRIL  Take 1 tablet (2.5 mg total) by mouth every morning.   milk thistle 175 MG tablet Take 175 mg by mouth daily.   nitroGLYCERIN  0.4 MG SL tablet Commonly known as: NITROSTAT  Place 1 tablet (0.4 mg total) under the tongue every 5 (five) minutes as needed for chest pain. DISSOLVE ONE TABLET UNDER THE TONGUE EVERY 5 MINUTES AS NEEDED FOR CHEST PAIN.  DO NOT EXCEED A TOTAL OF 3 DOSES IN 15 MINUTES   nortriptyline  10 MG capsule Commonly  known as: PAMELOR  Take 3 capsules (30 mg total) by mouth at bedtime.   oxyCODONE  5 MG immediate release tablet Commonly known as: Oxy IR/ROXICODONE  Take 1 tablet (5 mg total) by mouth every 4 (four) hours as needed for severe pain (pain score 7-10).   senna-docusate 8.6-50 MG tablet Commonly known as: Senokot-S Take 2 tablets by mouth at bedtime.   vitamin B-12 500 MCG tablet Commonly known as: CYANOCOBALAMIN Take 500 mcg by mouth daily.         Objective:   BP 139/82   Pulse 88   Ht 5' 4 (1.626 m)   Wt 190 lb (86.2 kg)   LMP 05/18/2011   SpO2 100%   BMI 32.61 kg/m   Wt Readings from Last 3 Encounters:  01/23/24 190 lb (86.2 kg)  01/14/24 192 lb 0.3 oz (87.1 kg)  01/09/24 191 lb (86.6 kg)    Physical Exam Vitals and nursing note reviewed.  Constitutional:      General: She is not in  acute distress.    Appearance: She is well-developed. She is not diaphoretic.  Eyes:     Conjunctiva/sclera: Conjunctivae normal.     Pupils: Pupils are equal, round, and reactive to light.  Cardiovascular:     Rate and Rhythm: Normal rate and regular rhythm.     Heart sounds: Normal heart sounds. No murmur heard. Pulmonary:     Effort: Pulmonary effort is normal. No respiratory distress.     Breath sounds: Normal breath sounds. No wheezing.  Abdominal:     General: Abdomen is flat. Bowel sounds are normal. There is no distension.     Palpations: Abdomen is soft.     Tenderness: There is abdominal tenderness. There is no guarding or rebound.     Hernia: No hernia is present.  Musculoskeletal:        General: No tenderness. Normal range of motion.  Skin:    General: Skin is warm and dry.     Findings: No rash.  Neurological:     Mental Status: She is alert and oriented to person, place, and time.     Coordination: Coordination normal.  Psychiatric:        Behavior: Behavior normal.    Physical Exam   VITALS: BP- 139/82 CHEST: Lungs clear to auscultation  bilaterally. CARDIOVASCULAR: Heart regular rate and rhythm, no murmurs.         Assessment & Plan:   Problem List Items Addressed This Visit       Digestive   GERD (gastroesophageal reflux disease) - Primary   Relevant Medications   esomeprazole  (NEXIUM ) 40 MG capsule   Other Visit Diagnoses       Esophageal spasm         Constipation, unspecified constipation type            Assessment and Plan    Atypical chest pain, likely non-cardiac Intermittent sternum pain, severe at times. History of Takotsubo cardiomyopathy. Pain relieved by fentanyl , not nitroglycerin . Possible gastrointestinal cause due to constipation and GERD. - Continue diltiazem  120 mg as prescribed. - Contact cardiologist to discuss recent symptoms and determine if further follow-up is needed.  Constipation Chronic constipation exacerbated recently. Senokot ineffective. Possible GERD symptom contributor. - Initiate Miralax  daily for one week, monitor for diarrhea and adjust as needed. - Increase water  intake.  Gastroesophageal reflux disease (GERD) Chronic GERD with recent exacerbation. Previous treatments ineffective. Symptoms may be exacerbated by constipation. -Patient has previously tried famotidine  and omeprazole and pantoprazole  without success - Prescribe Nexium  (esomeprazole ) for one month to assess effectiveness.  Osteopenia of bilateral hips Osteopenia confirmed by bone density scan, T-score -2.0. No pain attributed to osteopenia. - Ensure daily intake of calcium  and vitamin D .  Right breast calcifications, diagnostic mammogram planned Recent mammogram showed right breast calcifications. Low malignancy likelihood but follow-up important due to family history. - Proceed with diagnostic mammogram as scheduled.          Follow up plan: Return if symptoms worsen or fail to improve.  Counseling provided for all of the vaccine components No orders of the defined types were placed in this  encounter.   Tammy Levins, MD [facility] 01/23/2024, 11:25 AM

## 2024-01-23 NOTE — Addendum Note (Signed)
 Addended by: LEIGH ROSINA SAILOR on: 01/23/2024 01:28 PM   Modules accepted: Orders

## 2024-01-24 ENCOUNTER — Telehealth: Payer: Self-pay | Admitting: *Deleted

## 2024-01-24 ENCOUNTER — Other Ambulatory Visit: Payer: Self-pay | Admitting: Family Medicine

## 2024-01-24 DIAGNOSIS — R921 Mammographic calcification found on diagnostic imaging of breast: Secondary | ICD-10-CM

## 2024-01-24 NOTE — Telephone Encounter (Signed)
 Copied from CRM #[id number]. Topic: Clinical - Medical Advice >> Jan 24, 2024 10:19 AM Myrick T wrote: Reason for CRM: patient called requesting a blood test for possible breast cancer. Please f/u with patient

## 2024-01-24 NOTE — Telephone Encounter (Signed)
 I spoke to pt and she just had diagnostic mammogram and now needs a biopsy. Advised pt we should wait on the biopsy results and go from there. Pt voiced understanding.

## 2024-01-25 ENCOUNTER — Other Ambulatory Visit (HOSPITAL_COMMUNITY): Payer: Self-pay

## 2024-01-25 ENCOUNTER — Telehealth: Payer: Self-pay

## 2024-01-25 NOTE — Telephone Encounter (Signed)
 Pharmacy Patient Advocate Encounter   Received notification from Onbase that prior authorization for Esomeprazole  Magnesium  40MG  dr capsules is required/requested.   Insurance verification completed.   The patient is insured through [facility] .   Per test claim: PA required; PA submitted to above mentioned insurance via CoverMyMeds Key/confirmation #/EOC [facility] Status is pending

## 2024-01-29 NOTE — Telephone Encounter (Signed)
 Pharmacy Patient Advocate Encounter  Received notification from [employer] that Prior Authorization for  Esomeprazole  Magnesium  40MG  dr capsules  has been DENIED.  Full denial letter will be uploaded to the media tab. See denial reason below.   PA #/Case ID/Reference #: [id number]

## 2024-01-30 MED ORDER — PANTOPRAZOLE SODIUM 40 MG PO TBEC: 40.0000 mg | DELAYED_RELEASE_TABLET | Freq: Every day | ORAL | 3 refills | Status: DC

## 2024-01-30 NOTE — Telephone Encounter (Signed)
 Please let her know that it looks like her insurance company has denied the prior authorization for Nexium , I sent in pantoprazole  to try and replace it as an alternative Fonda Levins, MD [facility] 01/30/2024, 7:55 AM

## 2024-01-30 NOTE — Telephone Encounter (Signed)
 Left message on pts cell making aware and to call back if needed.

## 2024-01-30 NOTE — Addendum Note (Signed)
 Addended by: MARYANNE CHEW on: 01/30/2024 07:55 AM   Modules accepted: Orders

## 2024-01-31 ENCOUNTER — Ambulatory Visit
Admission: RE | Admit: 2024-01-31 | Discharge: 2024-01-31 | Disposition: A | Discharge status: Home / Self Care | Source: Ambulatory Visit | Attending: Family Medicine | Admitting: Family Medicine

## 2024-01-31 DIAGNOSIS — R921 Mammographic calcification found on diagnostic imaging of breast: Secondary | ICD-10-CM

## 2024-01-31 HISTORY — PX: BREAST BIOPSY: SHX20

## 2024-02-01 LAB — SURGICAL PATHOLOGY

## 2024-02-07 ENCOUNTER — Other Ambulatory Visit: Payer: Self-pay | Admitting: Neurology

## 2024-02-07 ENCOUNTER — Encounter (INDEPENDENT_AMBULATORY_CARE_PROVIDER_SITE_OTHER): Payer: Self-pay | Admitting: Gastroenterology

## 2024-02-07 ENCOUNTER — Ambulatory Visit (INDEPENDENT_AMBULATORY_CARE_PROVIDER_SITE_OTHER): Admitting: Gastroenterology

## 2024-02-07 VITALS — BP 137/80 | HR 84 | Temp 97.5°F | Ht 64.0 in | Wt 193.4 lb

## 2024-02-07 DIAGNOSIS — R0789 Other chest pain: Secondary | ICD-10-CM

## 2024-02-07 DIAGNOSIS — K219 Gastro-esophageal reflux disease without esophagitis: Secondary | ICD-10-CM | POA: Diagnosis not present

## 2024-02-07 DIAGNOSIS — G43009 Migraine without aura, not intractable, without status migrainosus: Secondary | ICD-10-CM

## 2024-02-07 DIAGNOSIS — K581 Irritable bowel syndrome with constipation: Secondary | ICD-10-CM

## 2024-02-07 DIAGNOSIS — K5900 Constipation, unspecified: Secondary | ICD-10-CM

## 2024-02-07 NOTE — Patient Instructions (Addendum)
 Continue Nexium  40 mg qday If recurrent episodes of chest pain, please let us  know to schedule an EGD with Bravo placement on PPI Increase intake of MiraLAX  to 2 capfuls every day and continue to pills of Senokot per day.  Can up titrate MiraLAX  up to 3 capfuls per day if needed.  If presenting too many bowel movements, can stop Senokot.

## 2024-02-07 NOTE — Progress Notes (Signed)
 Toribio Fortune, M.D. Gastroenterology & Hepatology [facility] Gastroenterology [address] [place], [place] [address] Primary Care Physician: Dettinger, Fonda LABOR, MD [address] [place] [address]  Referring MD: PCP  Chief Complaint: Chest pain  History of Present Illness: [name] is a 63 y.o. 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  Patient reports that in Monday July 28th she presented new onset  of acute onset of chest pain in the L subscapular are, which radiated to the middle chest area. There was some associated SOB due to the severity of the pain. She states she had significant pain that lasted through the day which was slightly different from the previous episodes she had in the past.  Due to this, the patient came to the ER at [facility] on 01/15/2024.  She had troponins x 2 that were negative and EKG that showed nonspecific changes of V5 and V6.  Notably, she probably had a CT coronary that did not show any presence of coronary artery disease and recent echocardiogram showed EF of 60 to 65%.  Cardiology evaluated the patient and considered the symptoms were unlikely to be cardiac in nature.  She was started on Imdur  during hospitalization but was eventually switched to another agent given reaction to the medication.  She was advised to follow with gastroenterology as the pain was not considered to be cardiac.  States the pain has subsided since she came to the ER.  She reports having a another episode of chest pain in February, which she described as a bear hugging me around the chest. States this pain eventually subsided.  Very occasionally has episode of pain in her chest occasionally which are mild.  Currently takes 1 capful Miralax  daily and Sennokot 2 tabs at night.  Sometimes can last a week without having a BM. Currently has a BM every 3 days.  Never had a Bravo testing in the past.  She has been taking Nexium  40 mg since she left the hospital. Has tried omeprazole and famotidine  in the past for management of tickle and throat discomfort in the past.  The patient denies having any nausea, vomiting, fever, chills, hematochezia, melena, hematemesis, abdominal distention, abdominal pain, diarrhea, jaundice, pruritus.  Notably, the patient was previously following with [facility] gastroenterology.  She used to see Dr. Donnald who had seen her for multiple years.  Patient brings extensive previous records dating that to at least 2013.  She was being evaluated for IBS in the past, as well as chronic dysphagia episodes of recurrent hoarseness which were thought to be secondary to possible LPR at some point.  As Dr. Donnald retired, she was following with Dr. Dianna.  He saw her for the last time on 08/24/2023 as part of the evaluation of her chest pain episodes.  At that time she was continued on pantoprazole  daily and famotidine  at night.  Last EGD: 04/25/2023 Grade C esophagitis, erythema in the antrum, normal duodenum. Recommended to continue pantoprazole  twice a day 40 mg.  Biopsies showed chronic inactive gastritis without H. pylori in the stomach, changes of reflux in the esophagus. Last Colonoscopy: 11/04/2020 2 polyps were removed from the transverse colon and cecum which measured 3 to 4 mm in size.  Presence of erythema in the rectum.  Advised to repeat colonoscopy in 5 years given family history of colon cancer.  Polyps were tubular adenomas and random biopsies from the colon were unremarkable.  FHx: sister (6 y/o) and father (71s) had colon cancer,neg for any gastrointestinal/liver disease, mother breast cancer, sister skin cancer Social: neg smoking, alcohol or illicit drug use Surgical: no abdominal surgeries  Past Medical History: Past Medical History:   Diagnosis Date   Anxiety    Arthritis    back (12/30/2014)   Asthma    as a child, no problems as an adult, has an albuterol  inhaler   Cerebral cyst    per brain MRI 07-12-2019 unchanged 6mm cyst inferomedial right frontal lobe   Chronic headaches    Chronic systolic (congestive) heart failure (HCC)    followed by dr orlean   Complication of anesthesia    pt is very sensitive to benzodiazepines, pt stated almost died   Compression fracture of lumbar vertebra (HCC) 05/27/2020   per pt L3 and L4   DCM (dilated cardiomyopathy) (HCC)    w/ Takatsubo---  followed by dr delford---  stress-induced --- cardiac cath 12-30-2014 ef 30-35%, recovered per cardiac MRI 03-15-2015 ef  64%   Depression    Disorder of mastoid    per brain MRI 07-12-2019 persistant large mastoid effusion   Dysuria    Environmental and seasonal allergies    Frequency of urination    GERD (gastroesophageal reflux disease)    History of asthma    childhood   History of esophageal spasm    History of kidney stones    History of melanoma excision 2001   right supraorbital (right forehead and upper eyelid )  s/p  Moh's procedure w/ sln bx,  no recurrence per pt   History of non-ST elevation myocardial infarction (NSTEMI) 12/29/2014   per cath normal coronaries , ef 30-35%---  stress-- induced Takotsubo syndrome    History of palpitations 01/05/2015   STRESS INDUCED   History of parotid cancer 2008   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-2006 s/p  right lateral parotidectomy w/ nerve dissection , modified radical neck dissection ;  completed  x35 fractions raditation 2008;  no recurrence per pt   Hyperlipidemia    Hypertension    Kidney cysts    Left ureteral calculus    Low back pain    Melanoma (HCC) 2001   face   Muscle spasm of back    Myocardial infarction (HCC) 2016   Pneumonia    as a child   PONV (postoperative nausea and vomiting)    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   Squamous cell carcinoma of skin 02/22/2021   in istu- right forehead ([id number])   Takotsubo syndrome 12/29/2014   cardiologist---  dr orlean--- dx NSTEMI -- stress-induced w/ DCM--  per cath 12-30-2014 normal coronaries , ef 30-35%,  recovered ef per cardiac MRI  ef 64%   Urgency of urination    Wears glasses     Past Surgical History: Past Surgical History:  Procedure Laterality Date   BACK SURGERY     BREAST BIOPSY Bilateral early 2000's   BREAST BIOPSY Right 01/31/2024   MM RT BREAST BX W LOC DEV 1ST LESION IMAGE BX SPEC STEREO GUIDE 01/31/2024 [facility]   CARDIAC CATHETERIZATION  2006 (APPROX)  [place]   NORMAL   CARDIAC CATHETERIZATION  12/30/2014   CARDIAC CATHETERIZATION N/A 12/30/2014   Procedure: Left Heart Cath and Coronary Angiography;  Surgeon: Deatrice DELENA Cage, MD;  Location: [facility];  Service: Cardiovascular;  Laterality: N/A;   CARDIOVASCULAR STRESS TEST  03-21-2012  DR DELFORD   NORMAL NUCLEAR STUDY/  NO ISCHEMIA/  EF 63%   COLONOSCOPY  10/2020   COLONOSCOPY WITH PROPOFOL   05/29/2017   CYSTOSCOPY W/ URETERAL STENT PLACEMENT Left 03/26/2013   Procedure: CYSTOSCOPY WITH RETROGRADE PYELOGRAM ;  Surgeon: Toribio Neysa Repine, MD;  Location: [facility];  Service: Urology;  Laterality: Left;   CYSTOSCOPY WITH RETROGRADE PYELOGRAM, URETEROSCOPY AND STENT PLACEMENT Bilateral 03/19/2013   Procedure: CYSTOSCOPY WITH RETROGRADE PYELOGRAM, BILATERAL URETEROSCOPY AND STENT PLACEMENT LEFT URETER,BILATERAL STONE EXTRACTION , HOLMIUM LASER LEFT URETER;  Surgeon: Toribio Neysa Repine, MD;  Location: [facility];  Service: Urology;  Laterality: Bilateral;   CYSTOSCOPY WITH STENT PLACEMENT Left 03/26/2013   Procedure: CYSTOSCOPY WITH STENT PLACEMENT;  Surgeon: Toribio Neysa Repine, MD;  Location: [facility];  Service: Urology;  Laterality: Left;   CYSTOSCOPY/URETEROSCOPY/HOLMIUM LASER/STENT PLACEMENT Left 07/07/2020   Procedure:  CYSTOSCOPY LEFT URETEROSCOPY/HOLMIUM LASER/STENT PLACEMENT;  Surgeon: Carolee Sherwood JONETTA DOUGLAS, MD;  Location: [facility];  Service: Urology;  Laterality: Left;   DIAGNOSTIC LAPAROSCOPY  04/12/2009   ESOPHAGOGASTRODUODENOSCOPY (EGD) WITH PROPOFOL  N/A 02/10/2016   Procedure: ESOPHAGOGASTRODUODENOSCOPY (EGD) WITH PROPOFOL ;  Surgeon: Lamar Bunk, MD;  Location: [facility];  Service: Endoscopy;  Laterality: N/A;   KIDNEY SURGERY  1966   BILATERAL URETER'S DILATATION   KNEE ARTHROSCOPY WITH MEDIAL MENISECTOMY Left 10/03/2023   Procedure: ARTHROSCOPY, KNEE, WITH MEDIAL MENISCECTOMY;  Surgeon: Duwayne Purchase, MD;  Location: [facility];  Service: Orthopedics;  Laterality: Left;  Left knee arthroscopy, partial medial meniscectomy and debridement   LUMBAR LAMINECTOMY/DECOMPRESSION MICRODISCECTOMY  05/18/2011   Procedure: LUMBAR LAMINECTOMY/DECOMPRESSION MICRODISCECTOMY;  Surgeon: Purchase JAYSON Duwayne;  Location: [facility];  Service: Orthopedics;  Laterality: Right;  Decompression Lumbar 4-Lumbar 5  Right    (xray)    LUMBAR LAMINECTOMY/DECOMPRESSION MICRODISCECTOMY N/A 05/06/2021   Procedure: Microlumbar decompression L5-S1;  Surgeon: Duwayne Purchase, MD;  Location: [facility];  Service: Orthopedics;  Laterality: N/A;  3 C-bed 90 mins   MELANOMA EXCISION WITH SENTINEL LYMPH NODE BIOPSY  2001   moh's procedure/  RIGHT FOREHEAD AND UPPER EYEBROW   MENISCUS DEBRIDEMENT Left 10/03/2023   Procedure: DEBRIDEMENT, MENISCUS, KNEE;  Surgeon: Duwayne Purchase, MD;  Location: [facility];  Service: Orthopedics;  Laterality: Left;   RIGHT LATERAL PAROTIDECTOMY W/ NERVE DISSECTION / RIGHT MODIFIED RADICAL NECK DISSECTION SPARING SCM ELEVENTH NERVE AND INTERNAL JUGULAR VEIN  09-12-2006  DR DWIGHT BATES   DR DWIGHT BATES; inside gland; lots of lymph nodes    Family History: Family History  Problem Relation Age of Onset   Hypertension Mother    COPD Mother    Breast cancer Mother 9       breast   Dementia Mother    Heart  disease Father    Cancer Father        Colorectal   Hyperlipidemia Father    Hypertension Father    Colon cancer Sister     Social History: Social History   Tobacco Use  Smoking Status Never  Smokeless Tobacco Never   Social History   Substance and Sexual Activity  Alcohol Use Not Currently   Comment: occasional- 1 drink per month   Social History   Substance and Sexual Activity  Drug Use Never    Allergies: Allergies  Allergen Reactions   Cephalexin Rash and Shortness Of Breath    Keflex   Lorazepam  Anaphylaxis and Shortness Of Breath  She may be very sensitive to benzo.    Stops breathing    Promethazine      Felt strange, knocked me out   Seasonal Ic [Octacosanol] Other (See Comments)    Medications: Current Outpatient Medications  Medication Sig Dispense Refill   acetaminophen  (TYLENOL ) 650 MG CR tablet Take 650 mg by mouth every 8 (eight) hours as needed for pain.     atorvastatin  (LIPITOR ) 40 MG tablet Take 1 tablet (40 mg total) by mouth daily. (Patient taking differently: Take 40 mg by mouth at bedtime.) 90 tablet 3   calcium  carbonate (TUMS - DOSED IN MG ELEMENTAL CALCIUM ) 500 MG chewable tablet Chew 1,000 mg by mouth daily as needed for indigestion or heartburn.     carvedilol  (COREG ) 3.125 MG tablet Take 1 tablet (3.125 mg total) by mouth 2 (two) times daily with a meal. 180 tablet 3   cholecalciferol  (VITAMIN D3) 25 MCG (1000 UNIT) tablet Take 1,000 Units by mouth daily.     diltiazem  (CARDIZEM  CD) 120 MG 24 hr capsule Take 1 capsule (120 mg total) by mouth daily. 30 capsule 11   esomeprazole  (NEXIUM ) 40 MG capsule Take 40 mg by mouth daily at 12 noon.     estradiol  (ESTRACE ) 0.1 MG/GM vaginal cream Discard plastic applicator. Insert a blueberry size amount (approximately 1 gram) of cream on fingertip inside vagina at bedtime every night for 1 week then every other night. For long term use. (Patient taking differently: Place 1 Applicatorful vaginally  See admin instructions. Discard plastic applicator. Insert a blueberry size amount (approximately 1 gram) of cream on fingertip inside vagina at bedtime every night for 1 week then every other night. For long term use.) 30 g 3   lisinopril  (ZESTRIL ) 2.5 MG tablet Take 1 tablet (2.5 mg total) by mouth every morning. 90 tablet 3   milk thistle 175 MG tablet Take 175 mg by mouth daily.     nitroGLYCERIN  (NITROSTAT ) 0.4 MG SL tablet Place 1 tablet (0.4 mg total) under the tongue every 5 (five) minutes as needed for chest pain. DISSOLVE ONE TABLET UNDER THE TONGUE EVERY 5 MINUTES AS NEEDED FOR CHEST PAIN.  DO NOT EXCEED A TOTAL OF 3 DOSES IN 15 MINUTES 25 tablet 8   nortriptyline  (PAMELOR ) 10 MG capsule TAKE 3 CAPSULES BY MOUTH AT BEDTIME 270 capsule 3   oxyCODONE  (OXY IR/ROXICODONE ) 5 MG immediate release tablet Take 1 tablet (5 mg total) by mouth every 4 (four) hours as needed for severe pain (pain score 7-10). 40 tablet 0   polyethylene glycol (MIRALAX  / GLYCOLAX ) 17 g packet Take 17 g by mouth daily.     senna-docusate (SENOKOT-S) 8.6-50 MG tablet Take 2 tablets by mouth at bedtime. 60 tablet 1   vitamin B-12 (CYANOCOBALAMIN) 500 MCG tablet Take 500 mcg by mouth daily.     No current facility-administered medications for this visit.    Review of Systems: GENERAL: negative for malaise, night sweats HEENT: No changes in hearing or vision, no nose bleeds or other nasal problems. NECK: Negative for lumps, goiter, pain and significant neck swelling RESPIRATORY: Negative for cough, wheezing CARDIOVASCULAR: Negative for chest pain, leg swelling, palpitations, orthopnea GI: SEE HPI MUSCULOSKELETAL: Negative for joint pain or swelling, back pain, and muscle pain. SKIN: Negative for lesions, rash PSYCH: Negative for sleep disturbance, mood disorder and recent psychosocial stressors. HEMATOLOGY Negative for prolonged bleeding, bruising easily, and swollen nodes. ENDOCRINE: Negative for cold or heat  intolerance, polyuria, polydipsia and goiter. NEURO: negative  for tremor, gait imbalance, syncope and seizures. The remainder of the review of systems is noncontributory.   Physical Exam: BP 137/80 (BP Location: Left Arm, Patient Position: Sitting, Cuff Size: Large)   Pulse 84   Temp (!) 97.5 F (36.4 C) (Temporal)   Ht 5' 4 (1.626 m)   Wt 193 lb 6.4 oz (87.7 kg)   LMP 05/18/2011   BMI 33.20 kg/m  GENERAL: The patient is AO x3, in no acute distress. HEENT: Head is normocephalic and atraumatic. EOMI are intact. Mouth is well hydrated and without lesions. NECK: Supple. No masses LUNGS: Clear to auscultation. No presence of rhonchi/wheezing/rales. Adequate chest expansion HEART: RRR, normal s1 and s2. ABDOMEN: Soft, nontender, no guarding, no peritoneal signs, and nondistended. BS +. No masses. EXTREMITIES: Without any cyanosis, clubbing, rash, lesions or edema. NEUROLOGIC: AOx3, no focal motor deficit. SKIN: no jaundice, no rashes   Imaging/Labs: as above  I personally reviewed and interpreted the available labs, imaging and endoscopic files.  Impression and Plan: [name] is a 63 y.o. 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  The patient has presented to episodes of severe chest pain, with most recent 1 requiring hospitalization and evaluation by cardiology.  It was considered the pain was not related to a cardiac source.  Since then, her pain has completely subsided.  It is possible that her symptoms may be related to GERD, especially given her history of esophagitis, for which I encouraged her to continue taking Nexium  40 mg daily.  However, if she were to have recurrent episodes, we will need to prove that she is having persistent GERD despite being on PPI, which will require a Bravo testing on PPI.  In terms of  her constipation, she can increase intake of MiraLAX  on top of her Senokot to achieve more regular bowel movements.  -Continue Nexium  40 mg qday - If recurrent episodes of chest pain, please let us  know to schedule an EGD with Bravo placement on PPI - Increase intake of MiraLAX  to 2 capfuls every day and continue to pills of Senokot per day.  Can up titrate MiraLAX  up to 3 capfuls per day if needed.  If presenting too many bowel movements, can stop Senokot.  All questions were answered.      Toribio Fortune, MD Gastroenterology and Hepatology [facility] Gastroenterology

## 2024-02-11 ENCOUNTER — Ambulatory Visit: Payer: Self-pay

## 2024-02-11 NOTE — Telephone Encounter (Signed)
 FYI Only or Action Required?: Action required by provider: medication refill request.  Patient was last seen in primary care on 01/23/2024 by Dettinger, Fonda LABOR, MD.  Called Nurse Triage reporting Covid Positive.  Symptoms began Saturday.  Interventions attempted: Nothing.  Symptoms are: gradually worsening.  Triage Disposition: Call PCP Within 24 Hours  Patient/caregiver understands and will follow disposition?: No, refuses disposition     Copied from CRM #[id number]. Topic: Clinical - Red Word Triage >> Feb 11, 2024  9:07 AM Larissa RAMAN wrote: [facility] that prompted transfer to Nurse Triage: sore throat, headache, sob, nasal drainage. Patient states she took a covid test on Saturday and it was positive. Symptoms worsening. Reason for Disposition  [1] HIGH RISK patient (e.g., weak immune system, age > 64 years, obesity with BMI 30 or higher, pregnant, chronic lung disease or other chronic medical condition) AND [2] COVID symptoms (e.g., cough, fever)  (Exceptions: Already seen by PCP and no new or worsening symptoms.)  Answer Assessment - Initial Assessment Questions 1. COVID-19 DIAGNOSIS: How do you know that you have COVID? (e.g., positive lab test or self-test, diagnosed by doctor or NP/PA, symptoms after exposure).     COVID test positive 2. COVID-19 EXPOSURE: Was there any known exposure to COVID before the symptoms began? CDC Definition of close contact: within 6 feet (2 meters) for a total of 15 minutes or more over a 24-hour period.      no 3. ONSET: When did the COVID-19 symptoms start?      Saturday 4. WORST SYMPTOM: What is your worst symptom? (e.g., cough, fever, shortness of breath, muscle aches)     Sore throat, runny nose, headache 9/10 5. COUGH: Do you have a cough? If Yes, ask: How bad is the cough?       Cough-dry 6. FEVER: Do you have a fever? If Yes, ask: What is your temperature, how was it measured, and when did it start?     no 7. RESPIRATORY  STATUS: Describe your breathing? (e.g., normal; shortness of breath, wheezing, unable to speak)      SOB 8. BETTER-SAME-WORSE: Are you getting better, staying the same or getting worse compared to yesterday?  If getting worse, ask, In what way?     worse 9. OTHER SYMPTOMS: Do you have any other symptoms?  (e.g., chills, fatigue, headache, loss of smell or taste, muscle pain, sore throat)     Headache, tired, low apeptite 10. HIGH RISK DISEASE: Do you have any chronic medical problems? (e.g., asthma, heart or lung disease, weak immune system, obesity, etc.)       na 11. VACCINE: Have you had the COVID-19 vaccine? If Yes, ask: Which one, how many shots, when did you get it?       yes 12. PREGNANCY: Is there any chance you are pregnant? When was your last menstrual period?       no 13. O2 SATURATION MONITOR:  Do you use an oxygen saturation monitor (pulse oximeter) at home? If Yes, ask What is your reading (oxygen level) today? What is your usual oxygen saturation reading? (e.g., 95%)       Na  Pt states she can not come into the office and would like to see if PCP can send rx for COVID - pt took test at home and it was positive  Protocols used: Coronavirus (COVID-19) Diagnosed or Suspected-A-AH

## 2024-02-11 NOTE — Telephone Encounter (Signed)
 Pt is not able to do video visit Scheduled pt for appt on 02/12/2024

## 2024-02-12 ENCOUNTER — Ambulatory Visit (INDEPENDENT_AMBULATORY_CARE_PROVIDER_SITE_OTHER): Admitting: Nurse Practitioner

## 2024-02-12 ENCOUNTER — Encounter: Payer: Self-pay | Admitting: Nurse Practitioner

## 2024-02-12 VITALS — BP 128/82 | HR 100 | Temp 98.3°F | Ht 64.0 in | Wt 187.0 lb

## 2024-02-12 DIAGNOSIS — U071 COVID-19: Secondary | ICD-10-CM | POA: Diagnosis not present

## 2024-02-12 MED ORDER — NIRMATRELVIR/RITONAVIR (PAXLOVID)TABLET: 3.0000 | ORAL_TABLET | Freq: Two times a day (BID) | ORAL | 0 refills | Status: AC

## 2024-02-12 NOTE — Progress Notes (Signed)
 Subjective:    Patient ID: [name], female    DOB: 1961-04-05, 63 y.o.   MRN: [id number]   Chief Complaint: Covid Positive   HPI  Patient has been sick since Saturday. Couh congestion and sore throat. Slight SOB. Headache . Tested positive for Covid on Saturday. Patient Active Problem List   Diagnosis Date Noted   Other chest pain 01/14/2024   Abnormal electrocardiogram (ECG) (EKG) 01/14/2024   Anemia 11/23/2023   Atrophic vaginitis 11/23/2023   Palatal lesion 11/06/2023   Migraines 08/27/2023   Chronic constipation 08/27/2023   Interstitial cystitis 08/25/2023   Neck mass 10/11/2022   Atherosclerotic heart disease of native coronary artery without angina pectoris 11/10/2021   Spinal stenosis at L4-L5 level 05/06/2021   History of head or neck cancer 01/06/2021   Chronic midline low back pain with sciatica 08/10/2020   Osteoarthritis of right knee 08/14/2019   Obesity (BMI 30.0-34.9) 01/03/2018   Myringotomy tube status 08/16/2017   Dysfunction of right eustachian tube 06/20/2017   Asymmetric SNHL (sensorineural hearing loss) 05/08/2017   Laryngopharyngeal reflux (LPR) 04/24/2017   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 04/02/2017   Right chronic serous otitis media 04/02/2017   Oral ulcer 05/25/2016   Aortic atherosclerosis (HCC) 02/04/2016   Hyperlipidemia 12/26/2015   Essential (primary) hypertension 12/26/2015   Back pain of thoracolumbar region 10/15/2015   Recurrent nephrolithiasis 05/14/2015   Chronic pelvic pain in female 04/19/2015   Cardiomyopathy (HCC)    ICM- EF 30-35% at cath 12/30/14-improved to 45% by echo 01/06/15 01/06/2015   Orthostatic hypotension 01/06/2015   IBS (irritable bowel syndrome) 01/05/2015   Biliary dyskinesia 01/05/2015   Recent NSTEMI ([place] event 12/30/14) 12/29/2014   GERD (gastroesophageal reflux disease) 03/21/2012   Uric acid renal calculus 06/19/1972        Review of Systems  Constitutional:  Negative for diaphoresis.  Eyes:  Negative for pain.  Respiratory:  Negative for shortness of breath.   Cardiovascular:  Negative for chest pain, palpitations and leg swelling.  Gastrointestinal:  Negative for abdominal pain.  Endocrine: Negative for polydipsia.  Skin:  Negative for rash.  Neurological:  Negative for dizziness, weakness and headaches.  Hematological:  Does not bruise/bleed easily.  All other systems reviewed and are negative.      Objective:   Physical Exam Constitutional:      Appearance: Normal appearance. She is obese.  HENT:     Nose: Congestion and rhinorrhea present.     Mouth/Throat:     Pharynx: No oropharyngeal exudate or posterior oropharyngeal erythema.  Cardiovascular:     Rate and Rhythm: Normal rate.     Pulses: Normal pulses.     Heart sounds: Normal heart sounds.  Pulmonary:     Effort: Pulmonary effort is normal.     Breath sounds: Normal breath sounds.  Skin:    General: Skin is warm.  Neurological:     General: No focal deficit present.     Mental Status: She is oriented to person, place, and time.  Psychiatric:        Mood and Affect: Mood normal.        Behavior: Behavior normal.    BP 128/82   Pulse 100   Temp 98.3 F (36.8 C)   Ht 5' 4 (1.626 m)   Wt 187 lb (84.8 kg)   LMP 05/18/2011   SpO2 95%   BMI 32.10 kg/m  Assessment & Plan:   [name] in today with chief complaint of Covid Positive   1. Positive self-administered antigen test for COVID-19 (Primary) 1. Take meds as prescribed 2. Use a cool mist humidifier especially during the winter months and when heat has been humid. 3. Use saline nose sprays frequently 4. Saline irrigations of the nose can be very helpful if done frequently.  * 4X daily for 1 week*  * Use of a nettie pot can be helpful with this. Follow directions with this* 5. Drink plenty of fluids 6. Keep thermostat turn down low 7.For  any cough or congestion- mucinex  8. For fever or aces or pains- take tylenol  or ibuprofen appropriate for age and weight.  * for fevers greater than 101 orally you may alternate ibuprofen and tylenol  every  3 hours.   Meds ordered this encounter  Medications   nirmatrelvir /ritonavir  (PAXLOVID ) 20 x 150 MG & 10 x 100MG  TABS    Sig: Take 3 tablets by mouth 2 (two) times daily for 5 days. (Take nirmatrelvir  150 mg two tablets twice daily for 5 days and ritonavir  100 mg one tablet twice daily for 5 days) Patient GFR is >60    Dispense:  30 tablet    Refill:  0    Supervising Provider:   MARYANNE CHEW A [[id number]]       The above assessment and management plan was discussed with the patient. The patient verbalized understanding of and has agreed to the management plan. Patient is aware to call the clinic if symptoms persist or worsen. Patient is aware when to return to the clinic for a follow-up visit. Patient educated on when it is appropriate to go to the emergency department.   Mary-Margaret Gladis, FNP

## 2024-02-12 NOTE — Patient Instructions (Signed)
 Quarantine and Isolation: What to Know Quarantine and isolation are ways to protect the public from diseases that: Could make a person very sick. Are contagious, which means they spread easily from person to person. Isolation is when you are sick and have to stay home and away from other people. Anne Shutter is when you have to stay home for a certain amount of time after being around a sick person. This is to see if you become sick. What diseases do I need to quarantine or isolate for? You may need to quarantine or isolate if you have or been around someone with: Cholera. Diphtheria. Infectious TB, also called tuberculosis. Plague. Smallpox. Yellow fever. Viral hemorrhagic fevers, such as [place], Ebola, and [place]. Severe acute respiratory syndromes, such as COVID-19. Flu. Measles. When should I quarantine? You should quarantine when you've been in close contact with someone who has, or is thought to have, a disease that spreads easily. Close contact means you've been less than 6 ft (1.8 m) away from the person for 15 minutes or more within a 24-hour period. When should I isolate? You should isolate when: You are sick with a disease that spreads easily. You test positive for a disease that spreads easily, even if you don't have symptoms. You are sick and think you have a disease that spreads easily. Get tested if you think you have a disease that spreads easily. If your test results are negative, you can stop isolation if for at least 24 hours both are true: You don't have symptoms, or your symptoms are getting better. You haven't had a fever without using fever-reducing medicine. Sometimes, 2 negative tests are needed to stop isolation. If your test results are positive, isolate as told by your health care provider or health officials. Follow these instructions at home: Medicines  Take your medicines only as told. If you were given antibiotics, take them as told. Do not  stop taking them even if you start to feel better. Stay up to date on your shots, also called vaccines. Get vaccines and booster shots as told. Lifestyle When you're in quarantine or isolation: If you must be around other people at home or in public, wear a high-quality mask that fits well. Do not get close to people who may get very sick. Use a bathroom that you don't have to share with other people, if you can. Do not share personal items like cups, towels, and spoons or forks. Wash your hands often with soap and water for at least 20 seconds. If you can't use soap and water, use hand sanitizer. Get better air flow in your home by opening a window or door. This may stop the disease from spreading.  General instructions Do not travel if you are in quarantine or isolation. Travel only when your provider or health officials say it's OK. Call your provider or health department if you need advice on how to care for yourself. Talk to your provider if you are immunocompromised. This means your body can't fight infections easily. Vaccines might not work as well for you. If you are sick, closely watch your symptoms. Follow instructions from your provider. You may be told to rest, drink fluids, and take medicine. Follow guidelines for quarantine and isolation, especially if you are in a place where diseases can spread easily and quickly. These places include jails, homeless shelters, and cruise ships. Where to find more information To learn more: Go to [employer] Click "Health Topics." Type "quarantine" in the search box.  This information is not intended to replace advice given to you by your health care provider. Make sure you discuss any questions you have with your health care provider. Document Revised: 02/26/2023 Document Reviewed: 02/26/2023 Elsevier Patient Education  2024 [employer].

## 2024-02-15 ENCOUNTER — Telehealth: Payer: Self-pay | Admitting: Family Medicine

## 2024-02-15 DIAGNOSIS — Z0279 Encounter for issue of other medical certificate: Secondary | ICD-10-CM

## 2024-02-15 NOTE — Telephone Encounter (Signed)
 [employer] faxed disability  forms to be completed  Form Fee Paid? (Y/N)       yes      If NO, form is placed on front office manager desk to hold until payment received. If YES, then form will be placed in the RX/HH Nurse Coordinators box for completion.  Form will not be processed until payment is received

## 2024-02-21 ENCOUNTER — Telehealth: Payer: Self-pay | Admitting: Family Medicine

## 2024-02-21 NOTE — Telephone Encounter (Signed)
 Ok for work note?

## 2024-02-21 NOTE — Telephone Encounter (Signed)
 Copied from CRM [id number]. Topic: General - Other >> Feb 21, 2024 11:29 AM Emylou G wrote: Reason for CRM: Patient called.. would a return work letter that was seen last week for covid included.. wants to go back to work tomorrow.. she wants to pick it up today.. ( pls call either of when she can come or not? )

## 2024-02-21 NOTE — Telephone Encounter (Signed)
 Copied from CRM #[id number]. Topic: General - Other >> Feb 21, 2024 11:07 AM Gustabo D wrote: Call patient when FMLA is completed and she needs to see it to confirm the info before faxing it to [facility]

## 2024-02-21 NOTE — Telephone Encounter (Signed)
 Patient had a visit with MMM on 8/26.  Was given a work note to return on 8/29.  Patient would like her work note extended to go back to work tomorrow 9/5.  Please advise

## 2024-02-21 NOTE — Telephone Encounter (Signed)
Note written and left up front for patient pick up. Patient notified and verbalized understanding

## 2024-02-25 NOTE — Telephone Encounter (Signed)
 Aware faxing paperwork today, dates are from 02/09/24 to 02/22/24, copy will be placed up front.

## 2024-03-13 ENCOUNTER — Telehealth: Payer: Self-pay | Admitting: Family Medicine

## 2024-03-13 NOTE — Telephone Encounter (Signed)
 Pt came in today & ask about getting a lab-Braca Gene test? done here in the lab. Pt states that it maybe an expensive test. Just call pt! It may need Prior Auth to have this done.

## 2024-03-14 NOTE — Telephone Encounter (Signed)
 BRCA gene test states the test that they do for breast cancer, does she have the family history or which she recently diagnosed with breast cancer and they want her tested?  I do not know of the reasoning behind why she wants the test but I do know of the BRCA gene test

## 2024-03-14 NOTE — Telephone Encounter (Signed)
 Confirmed with the lab that they do the BRCA gene testing.  Spoke with pts husband. He states that pt has family members that have been diagnosed with breast cancer. He is not sure who. Husband will have wife call back on Monday with details.

## 2024-03-31 NOTE — Telephone Encounter (Signed)
 Patient return call. ?

## 2024-04-01 ENCOUNTER — Telehealth: Payer: Self-pay

## 2024-04-01 ENCOUNTER — Other Ambulatory Visit: Payer: Self-pay

## 2024-04-01 NOTE — Telephone Encounter (Signed)
 Spoke with pts husband. Pt is walking the dog. Advised to call back and schedule a lab appt for the BRCA testing. Husband will make wife aware.   Message sent to [facility] to place future order.

## 2024-04-01 NOTE — Telephone Encounter (Signed)
 Copied from CRM #[id number]. Topic: Clinical - Medication Prior Auth >> Apr 01, 2024  3:25 PM Lauren C wrote: Reason for CRM: Pt calling to get icd10 code to check insurance coverage. She states she does have family history of breast cancer. Code for this is Z80.3. She also has personal history of breast cancer, code is Z85.41. [place] [address]. She is requesting a prior authorization for this testing.

## 2024-04-01 NOTE — Telephone Encounter (Signed)
 Copied from CRM [id number]. Topic: Clinical - Request for Lab/Test Order >> Apr 01, 2024 10:49 AM Donna BRAVO wrote: Reason for CRM: patient returning call from [place]. Read note verbatim:Hill, Rosina SAILOR, LPN    89[birth year]/74  8:32 AM Note Spoke with pts husband. Pt is walking the dog. Advised to call back and schedule a lab appt for the BRCA testing. Husband will make wife aware.   Message sent to [facility] to place future order.  Lab order has not been placed   Patient is also asking about insurance for lab test.   Patient would like to speak with Rosina ----------------------------------------------------------------------- From previous Reason for Contact - Scheduling: Patient/patient representative is calling to schedule an appointment. Refer to attachments for appointment information.

## 2024-04-02 ENCOUNTER — Encounter (INDEPENDENT_AMBULATORY_CARE_PROVIDER_SITE_OTHER): Payer: Self-pay | Admitting: Gastroenterology

## 2024-04-02 NOTE — Telephone Encounter (Unsigned)
 Copied from CRM [id number]. Topic: General - Call Back - No Documentation >> Apr 02, 2024  3:47 PM Tysheama G wrote: Reason for CRM: Patient returning [place] call. CAL wanted me to relay the questions for them. Who had breast cancer? - her mother What was their age at the time of diagnosis? - she can't remember the year, but in her 6s  Did pt have personal h/o breast cancer? If so when? - she wasn't sure of what I was asking but she did state she had abnormal mammograms and biopsy on both breast. She also mention she had 2 other cancer. She wanted to know if yall did a PA with her insurance   Callback number [phone]

## 2024-04-02 NOTE — Telephone Encounter (Signed)
 Please ask her specifics are how old her family members were when they had breast cancer because sometimes they can help us  with diagnosis coverage.  Also ask her when she had breast cancer because the most recent biopsy look like it was 3 or adenoid tissue but not breast cancer, had she had some previous biopsies that I am not remembering?  I know that she had head and neck cancer but I did not recall the breast cancer.

## 2024-04-02 NOTE — Telephone Encounter (Signed)
 To get the BRCA gene test covered which is a very expensive test I still need more information from her.  Is there family history of breast cancer or was this encouraged by her oncologist to get this.  If I do not have a diagnosis to go with it then it will not be covered and it is a very expensive test

## 2024-04-02 NOTE — Telephone Encounter (Signed)
 Left message making pt aware of Dr. Williemae recommendations.  Who had breast cancer? What was their age at the time of diagnosis? Did pt have personal h/o breast cancer? If so when?  Lab appt cancelled for 10/16 since more information is needed.

## 2024-04-03 ENCOUNTER — Other Ambulatory Visit

## 2024-04-03 ENCOUNTER — Ambulatory Visit: Admitting: Urology

## 2024-04-08 NOTE — Telephone Encounter (Signed)
 Pt has an appt on 10/29.

## 2024-04-09 NOTE — Telephone Encounter (Signed)
 As far as my records show she does have the personal history of breast biopsies but looking back at the ones at least the ones that I can see unless she has had some elsewhere show that they were noncancerous results from the biopsy.  They were abnormal mammograms but the biopsy showed no cancer.  Unless there is something that I am not seeing that she can get access to it, I cannot put a personal history of breast cancer.  Family history of breast cancer typically only qualifies for BRCA testing if the family member was a first or second-degree relative and was diagnosed with breast cancer under the age of 98.  Any family members diagnosed with breast cancer over the age of 105 would also not qualify for family history of breast cancer.  Based on that history, I can still put in the order for the testing but I cannot promise that it would get covered.  Let me know what she says  1. Personal History of Female Breast Cancer  BRCA1 and BRCA2 genetic testing for susceptibility to breast or ovarian cancer is covered in adults as medically reasonable and necessary when there is a personal history of breast cancer (invasive breast cancer or ductal carcinoma in situ) and ANY of the following indications:  Diagnosed at age 32 or younger; Diagnosed at age 48 or younger with at least one close blood relative* with breast cancer at any age; Diagnosed with two breast primaries (includes bilateral disease or cases where there are two or more clearly separate ipsilateral primary tumors) when the first breast cancer diagnosis occurred prior to age 7; Diagnosed at age 60 or younger with a triple negative breast cancer (estrogen receptor [ER] negative, progesterone receptor [PR] negative, and human epidermal growth factor receptor 2 [HER2] negative); Diagnosed at any age and there are at least two close blood relatives* with pancreatic cancer or prostate cancer with Gleason score >7 at any age; Diagnosed at age 50 or  younger with a limited family history (e.g., fewer than two first or second degree female relatives or female relatives surviving beyond 45 years in the relevant maternal and/or paternal lineage); Diagnosed at any age and there are at least two close blood relatives* with breast cancer at any age; Diagnosed at any age with at least one close blood relative* with breast cancer at age 2 or younger; Diagnosed at any age with at least one close blood relative* with epithelial ovarian cancer, fallopian tube, or primary peritoneal cancer; Close female blood relative* with breast cancer; Individual of Ashkenazi Jewish descent (begin testing with Ashkenazi Jewish founder specific mutations). If negative, complete analysis may be considered if ancestry also includes non-Ashkenazi Jewish relatives or other criteria for BRCA1/BRCA2 genetic testing are met. *NCCN defines blood relative as first- (parents, siblings and children), second- (grandparents, aunts, uncles, nieces and nephews, grandchildren and half-siblings), and third degree-relatives (great-grandparents, great-aunts, great uncles, great grandchildren and first cousins) on same side of family.  Genetic testing for a known mutation in a family is a covered service for individuals with signs and/or symptoms of breast cancer. Testing of an unaffected Medicare eligible individual or family member is not a covered Medicare service.  2. Personal History of Other Cancer  BRCA1 and BRCA2 genetic testing for susceptibility to breast or ovarian cancer is covered in adults as medically necessary when there is a personal history of ANY of the following indications:  Personal history of epithelial ovarian, fallopian tube, or primary peritoneal cancer; Personal  history of female breast cancer; Personal history of pancreatic cancer or prostate cancer with Gleason score =7 at any age, =1 close blood relatives* with breast (=50 y), invasive ovarian, pancreatic cancer, or  prostate cancer with Gleason score =7 at any age; Personal history of pancreatic cancer at any age with Ashkenazi Jewish ancestry (Begin testing with Ashkenazi Jewish founder specific mutations). If negative, complete analysis should be performed. Complete analysis may be considered if ancestry also includes non-Ashkenazi Jewish relatives and other criteria for BRCA1/BRCA2 genetic testing are met.

## 2024-04-09 NOTE — Telephone Encounter (Signed)
 Pt has an appt 10/29 to discuss

## 2024-04-14 ENCOUNTER — Other Ambulatory Visit: Payer: Self-pay | Admitting: Family Medicine

## 2024-04-16 ENCOUNTER — Telehealth: Payer: Self-pay | Admitting: Family Medicine

## 2024-04-16 ENCOUNTER — Encounter: Payer: Self-pay | Admitting: Family Medicine

## 2024-04-16 ENCOUNTER — Ambulatory Visit (INDEPENDENT_AMBULATORY_CARE_PROVIDER_SITE_OTHER)

## 2024-04-16 ENCOUNTER — Ambulatory Visit (INDEPENDENT_AMBULATORY_CARE_PROVIDER_SITE_OTHER): Admitting: Family Medicine

## 2024-04-16 VITALS — BP 107/74 | HR 96 | Temp 96.9°F | Ht 64.0 in | Wt 190.0 lb

## 2024-04-16 DIAGNOSIS — E782 Mixed hyperlipidemia: Secondary | ICD-10-CM

## 2024-04-16 DIAGNOSIS — J0111 Acute recurrent frontal sinusitis: Secondary | ICD-10-CM | POA: Diagnosis not present

## 2024-04-16 DIAGNOSIS — R0602 Shortness of breath: Secondary | ICD-10-CM | POA: Diagnosis not present

## 2024-04-16 DIAGNOSIS — Z803 Family history of malignant neoplasm of breast: Secondary | ICD-10-CM

## 2024-04-16 DIAGNOSIS — I10 Essential (primary) hypertension: Secondary | ICD-10-CM | POA: Diagnosis not present

## 2024-04-16 LAB — LIPID PANEL

## 2024-04-16 MED ORDER — ATORVASTATIN CALCIUM 40 MG PO TABS: 40.0000 mg | ORAL_TABLET | Freq: Every day | ORAL | 3 refills | Status: AC

## 2024-04-16 MED ORDER — AMOXICILLIN 500 MG PO CAPS: 500.0000 mg | ORAL_CAPSULE | Freq: Two times a day (BID) | ORAL | 0 refills | Status: DC

## 2024-04-16 NOTE — Telephone Encounter (Signed)
 Copied from CRM [id number]. Topic: Clinical - Medical Advice >> Apr 16, 2024  3:06 PM Kevelyn M wrote: Reason for CRM: Debbie calling with [facility]. The [phone] BRCA procedure does not need authorization because it is a preventive screening.  [phone] ref. [id number]

## 2024-04-16 NOTE — Progress Notes (Signed)
 BP 107/74   Pulse 96   Temp (!) 96.9 F (36.1 C)   Ht 5' 4 (1.626 m)   Wt 190 lb (86.2 kg)   LMP 05/18/2011   SpO2 97%   BMI 32.61 kg/m    Subjective:   Patient ID: [name], female    DOB: 1960-10-03, 63 y.o.   MRN: [id number]  HPI: [name] is a 63 y.o. female presenting on 04/16/2024 for Medical Management of Chronic Issues, Hypertension, Hyperlipidemia, and Shortness of Breath (? Pneumonia)   Discussed the use of AI scribe software for clinical note transcription with the patient, who gave verbal consent to proceed.  History of Present Illness   [name] is a 63 year old female with hypertension and hyperlipidemia who presents with throat discomfort and labored breathing.  Oropharyngeal discomfort and dyspnea - Throat discomfort and labored breathing for approximately four days - Sensation described as 'settled in my throat' - Discomfort radiates to right ear - Breathing is somewhat labored - Symptoms have worsened slightly despite use of Mucinex , saltwater gargles, and saline nasal spray - No recent contact with sick individuals  Lightheadedness and dizziness - Lightheadedness and dizziness present this week - Attributes symptoms to current illness - Missed work on Monday due to feeling unwell  Recent immunizations - Received both influenza and COVID-19 vaccines approximately ten days ago  Pulmonary nodule surveillance - Concern regarding a spot found on lung during CT scan in July - Uncertain about follow-up plan for this finding - Has seen an oncologist regarding this issue  Cardiovascular disease management - History of hypertension and hyperlipidemia - Takes atorvastatin  40 mg at night, carvedilol , diltiazem , and lisinopril  - Sees a cardiologist once or twice a year - Cardiac status has been stable since a previous episode in the summer  Genetic testing inquiry - Inquires about prior authorization for BRCA gene test - No updates  received regarding approval          Relevant past medical, surgical, family and social history reviewed and updated as indicated. Interim medical history since our last visit reviewed. Allergies and medications reviewed and updated.  Review of Systems  Constitutional:  Negative for chills and fever.  HENT:  Positive for congestion, postnasal drip, rhinorrhea, sinus pressure, sneezing and sore throat. Negative for ear discharge and ear pain.   Eyes:  Negative for pain, redness and visual disturbance.  Respiratory:  Positive for cough and shortness of breath. Negative for chest tightness.   Cardiovascular:  Negative for chest pain and leg swelling.  Genitourinary:  Negative for difficulty urinating and dysuria.  Musculoskeletal:  Negative for back pain and gait problem.  Skin:  Negative for rash.  Neurological:  Positive for dizziness, light-headedness and headaches.  Psychiatric/Behavioral:  Negative for agitation and behavioral problems.   All other systems reviewed and are negative.   Per HPI unless specifically indicated above   Allergies as of 04/16/2024       Reactions   Cephalexin Rash, Shortness Of Breath   Keflex   Lorazepam  Anaphylaxis, Shortness Of Breath   She may be very sensitive to benzo.    Stops breathing    Promethazine     Felt strange, knocked me out   Seasonal Ic [octacosanol] Other (See Comments)        Medication List        Accurate as of April 16, 2024 10:58 AM. If you have any questions, ask your nurse or doctor.  acetaminophen  650 MG CR tablet Commonly known as: TYLENOL  Take 650 mg by mouth every 8 (eight) hours as needed for pain.   amoxicillin  500 MG capsule Commonly known as: AMOXIL  Take 1 capsule (500 mg total) by mouth 2 (two) times daily. Started by: Fonda LABOR Kassiah Mccrory   atorvastatin  40 MG tablet Commonly known as: LIPITOR  Take 1 tablet (40 mg total) by mouth daily.   calcium  carbonate 500 MG chewable  tablet Commonly known as: TUMS - dosed in mg elemental calcium  Chew 1,000 mg by mouth daily as needed for indigestion or heartburn.   carvedilol  3.125 MG tablet Commonly known as: COREG  Take 1 tablet (3.125 mg total) by mouth 2 (two) times daily with a meal.   cholecalciferol  25 MCG (1000 UNIT) tablet Commonly known as: VITAMIN D3 Take 1,000 Units by mouth daily.   diltiazem  120 MG 24 hr capsule Commonly known as: Cardizem  CD Take 1 capsule (120 mg total) by mouth daily.   esomeprazole  40 MG capsule Commonly known as: NEXIUM  Take 40 mg by mouth daily at 12 noon.   estradiol  0.1 MG/GM vaginal cream Commonly known as: ESTRACE  Discard plastic applicator. Insert a blueberry size amount (approximately 1 gram) of cream on fingertip inside vagina at bedtime every night for 1 week then every other night. For long term use. What changed:  how much to take how to take this when to take this   lisinopril  2.5 MG tablet Commonly known as: ZESTRIL  Take 1 tablet (2.5 mg total) by mouth every morning.   milk thistle 175 MG tablet Take 175 mg by mouth daily.   nitroGLYCERIN  0.4 MG SL tablet Commonly known as: NITROSTAT  Place 1 tablet (0.4 mg total) under the tongue every 5 (five) minutes as needed for chest pain. DISSOLVE ONE TABLET UNDER THE TONGUE EVERY 5 MINUTES AS NEEDED FOR CHEST PAIN.  DO NOT EXCEED A TOTAL OF 3 DOSES IN 15 MINUTES   nortriptyline  10 MG capsule Commonly known as: PAMELOR  TAKE 3 CAPSULES BY MOUTH AT BEDTIME   oxyCODONE  5 MG immediate release tablet Commonly known as: Oxy IR/ROXICODONE  Take 1 tablet (5 mg total) by mouth every 4 (four) hours as needed for severe pain (pain score 7-10).   polyethylene glycol 17 g packet Commonly known as: MIRALAX  / GLYCOLAX  Take 17 g by mouth daily.   senna-docusate 8.6-50 MG tablet Commonly known as: Senokot-S Take 2 tablets by mouth at bedtime.   vitamin B-12 500 MCG tablet Commonly known as: CYANOCOBALAMIN Take 500 mcg  by mouth daily.         Objective:   BP 107/74   Pulse 96   Temp (!) 96.9 F (36.1 C)   Ht 5' 4 (1.626 m)   Wt 190 lb (86.2 kg)   LMP 05/18/2011   SpO2 97%   BMI 32.61 kg/m   Wt Readings from Last 3 Encounters:  04/16/24 190 lb (86.2 kg)  02/12/24 187 lb (84.8 kg)  02/07/24 193 lb 6.4 oz (87.7 kg)    Physical Exam Vitals and nursing note reviewed.  Constitutional:      Appearance: She is well-developed.  HENT:     Right Ear: Tympanic membrane and ear canal normal.     Left Ear: Tympanic membrane and ear canal normal.     Mouth/Throat:     Mouth: Mucous membranes are moist.     Pharynx: Oropharynx is clear. No pharyngeal swelling, oropharyngeal exudate or posterior oropharyngeal erythema.  Neck:     Thyroid : No  thyromegaly.  Neurological:     Mental Status: She is alert.    Physical Exam   VITALS: T- 96.0, BP- 107/74 HEENT: No infection in right ear CHEST: Lungs clear to auscultation bilaterally         Assessment & Plan:   Problem List Items Addressed This Visit       Cardiovascular and Mediastinum   Essential (primary) hypertension - Primary (Chronic)   Relevant Medications   atorvastatin  (LIPITOR ) 40 MG tablet   Other Relevant Orders   CBC with Differential/Platelet   CMP14+EGFR   Lipid panel     Other   Hyperlipidemia (Chronic)   Relevant Medications   atorvastatin  (LIPITOR ) 40 MG tablet   Other Relevant Orders   CBC with Differential/Platelet   CMP14+EGFR   Lipid panel   Other Visit Diagnoses       Shortness of breath       Relevant Medications   amoxicillin  (AMOXIL ) 500 MG capsule   Other Relevant Orders   DG Chest 2 View     Acute recurrent frontal sinusitis       Relevant Medications   amoxicillin  (AMOXIL ) 500 MG capsule         Acute upper respiratory infection with right-sided throat and ear pain Symptoms include dry cough, sneezing, and right-sided throat and ear pain. No infection in right ear. - Order chest x-ray to  evaluate respiratory symptoms.  Pulmonary nodule under surveillance Pulmonary nodule previously identified. Previous imaging in July included x-ray and CT scan.  Essential hypertension Blood pressure 107/74. Recent lightheadedness likely due to current illness, not hypertension. - Advise to stay hydrated. - Monitor blood pressure and report persistent lightheadedness.  Mixed hyperlipidemia Continues atorvastatin  40 mg nightly. Cardiac status well-managed per recent evaluations.  General Health Maintenance Received flu and COVID vaccinations ten days ago. No recent illness exposure.  Follow-up Pending prior authorization for BRCA gene test. Approval status unclear. - Follow up with insurance regarding prior authorization status. - Return to clinic after chest x-ray for further discussion.          Follow up plan: Return in about 6 months (around 10/15/2024), or if symptoms worsen or fail to improve, for Hypertension and hyperlipidemia recheck.  Counseling provided for all of the vaccine components Orders Placed This Encounter  Procedures   DG Chest 2 View   CBC with Differential/Platelet   CMP14+EGFR   Lipid panel    Fonda Levins, MD [facility] 04/16/2024, 10:58 AM

## 2024-04-17 LAB — CMP14+EGFR
ALT: 28 IU/L (ref 0–32)
AST: 24 IU/L (ref 0–40)
Albumin: 4.3 g/dL (ref 3.9–4.9)
Alkaline Phosphatase: 102 IU/L (ref 49–135)
BUN/Creatinine Ratio: 12 (ref 12–28)
BUN: 10 mg/dL (ref 8–27)
Bilirubin Total: 0.3 mg/dL (ref 0.0–1.2)
CO2: 24 mmol/L (ref 20–29)
Calcium: 9.7 mg/dL (ref 8.7–10.3)
Chloride: 101 mmol/L (ref 96–106)
Creatinine, Ser: 0.82 mg/dL (ref 0.57–1.00)
Globulin, Total: 2.6 g/dL (ref 1.5–4.5)
Glucose: 92 mg/dL (ref 70–99)
Potassium: 4.9 mmol/L (ref 3.5–5.2)
Sodium: 140 mmol/L (ref 134–144)
Total Protein: 6.9 g/dL (ref 6.0–8.5)
eGFR: 79 mL/min/1.73 (ref >59)

## 2024-04-17 LAB — CBC WITH DIFFERENTIAL/PLATELET
Basophils Absolute: 0 x10E3/uL (ref 0.0–0.2)
Basos: 0 %
EOS (ABSOLUTE): 0.1 x10E3/uL (ref 0.0–0.4)
Eos: 2 %
Hematocrit: 40.4 % (ref 34.0–46.6)
Hemoglobin: 13.2 g/dL (ref 11.1–15.9)
Immature Grans (Abs): 0 x10E3/uL (ref 0.0–0.1)
Immature Granulocytes: 0 %
Lymphocytes Absolute: 1.4 x10E3/uL (ref 0.7–3.1)
Lymphs: 27 %
MCH: 32.3 pg (ref 26.6–33.0)
MCHC: 32.7 g/dL (ref 31.5–35.7)
MCV: 99 fL — ABNORMAL HIGH (ref 79–97)
Monocytes Absolute: 0.4 x10E3/uL (ref 0.1–0.9)
Monocytes: 7 %
Neutrophils Absolute: 3.4 x10E3/uL (ref 1.4–7.0)
Neutrophils: 64 %
Platelets: 214 x10E3/uL (ref 150–450)
RBC: 4.09 x10E6/uL (ref 3.77–5.28)
RDW: 13.1 % (ref 11.7–15.4)
WBC: 5.3 x10E3/uL (ref 3.4–10.8)

## 2024-04-17 LAB — LIPID PANEL
Cholesterol, Total: 188 mg/dL (ref 100–199)
HDL: 50 mg/dL (ref >39)
LDL CALC COMMENT:: 3.8 ratio (ref 0.0–4.4)
LDL Chol Calc (NIH): 106 mg/dL — AB (ref 0–99)
Triglycerides: 182 mg/dL — AB (ref 0–149)
VLDL Cholesterol Cal: 32 mg/dL (ref 5–40)

## 2024-04-17 NOTE — Telephone Encounter (Unsigned)
 Copied from CRM #[id number]. Topic: General - Other >> Apr 17, 2024  9:26 AM Rosaria BRAVO wrote: Reason for CRM: Marval called back following up on her request from yesterday  Requesting that authorization be obtained as preventative, needs to be obtained from [facility] [id number]. Call and give the CPT code, they will get an authorization and then the patient will be covered at a 100%.   Debbie calling with [employer]. The [phone] BRCA procedure does not need authorization because it is a preventive screening.  [id number] ref. [id number]

## 2024-04-18 ENCOUNTER — Ambulatory Visit: Payer: Self-pay | Admitting: Family Medicine

## 2024-04-18 NOTE — Telephone Encounter (Signed)
 Patient aware and verbalized understanding.

## 2024-04-18 NOTE — Telephone Encounter (Signed)
 Again I can put the order in for her and they can save preventative but I cannot promise that is going to be covered but she can come in and do it, I have placed the lab order and she can make a lab appointment.

## 2024-04-18 NOTE — Telephone Encounter (Signed)
 Patient aware, reports she is feeling no better.

## 2024-04-18 NOTE — Addendum Note (Signed)
 Addended by: MARYANNE CHEW on: 04/18/2024 11:37 AM   Modules accepted: Orders

## 2024-04-22 ENCOUNTER — Telehealth: Payer: Self-pay

## 2024-04-22 NOTE — Telephone Encounter (Signed)
 Pt made aware. Lab appt made for 11/6 and future orders have been placed by [facility].

## 2024-04-22 NOTE — Telephone Encounter (Signed)
 Pt states that she has had to miss work due to her resent ear and throat pain. She states that she is currently on an ATB. Pt is having her employer fax over FMLA forms for completion. Pt is aware that there is a charge to complete. Will route message to [employer].

## 2024-04-22 NOTE — Telephone Encounter (Unsigned)
 Copied from CRM [id number]. Topic: Clinical - Medication Prior Auth >> Apr 22, 2024 11:08 AM Delon DASEN wrote: Reason for CRM: Debbie with [phone] BRCA procedure - go online or call [employer] for them to  issue the prior auth [phone]- ref [id number]

## 2024-04-23 ENCOUNTER — Telehealth: Payer: Self-pay | Admitting: Family Medicine

## 2024-04-23 DIAGNOSIS — Z0279 Encounter for issue of other medical certificate: Secondary | ICD-10-CM

## 2024-04-23 NOTE — Telephone Encounter (Signed)
 [employer] faxed disability forms to be completed  Form Fee Paid? (Y/N)       NO. Pt will come by today or tomorrow to pay.     If NO, form is placed on front office manager desk to hold until payment received. If YES, then form will be placed in the RX/HH Nurse Coordinators box for completion.  Form will not be processed until payment is received

## 2024-04-23 NOTE — Telephone Encounter (Signed)
 Pt came by around 4:30pm & paid for forms. Please fax copy, when forms are completed & pt wants a copy for herself.

## 2024-04-24 ENCOUNTER — Other Ambulatory Visit (HOSPITAL_COMMUNITY): Payer: Self-pay

## 2024-04-24 ENCOUNTER — Other Ambulatory Visit

## 2024-04-24 DIAGNOSIS — Z803 Family history of malignant neoplasm of breast: Secondary | ICD-10-CM

## 2024-04-24 NOTE — Telephone Encounter (Signed)
 Good morning Rosina, is the PA for a medication, if so what is the medication please?

## 2024-04-24 NOTE — Telephone Encounter (Signed)
 Left message for pt to return call and inform of the dates she had missed work and how long she was out as well.

## 2024-04-30 NOTE — Telephone Encounter (Signed)
 Prior auth initiated today. Research form, furniture conservator/restorer and demographics emailed to [email or web address]

## 2024-05-01 NOTE — Telephone Encounter (Signed)
 PCP completed and signed physician statement forms. They have been faxed to [facility] at fax number [phone]. Patient has been contacted and informed they are complete.

## 2024-05-12 NOTE — Telephone Encounter (Signed)
 Dr. Maryanne,  Have you seen anything about an approval for the PA on BRCA lab?

## 2024-05-13 ENCOUNTER — Ambulatory Visit: Admitting: Urology

## 2024-05-14 NOTE — Telephone Encounter (Signed)
 I have not seen anything

## 2024-05-14 NOTE — Telephone Encounter (Signed)
 Checked with front office manager to see if she received a email in regards to this. She has not.

## 2024-05-20 NOTE — Telephone Encounter (Signed)
 Left message for pt to call back. Informed on vmail that I had emailed PA form to her insurance for BRCA lab approval but that I have not received a response.  Advised pt to call back to inform if she has had a billing issue with the lab test.

## 2024-06-20 ENCOUNTER — Encounter: Payer: Self-pay | Admitting: Family Medicine

## 2024-06-20 ENCOUNTER — Ambulatory Visit: Admitting: Family Medicine

## 2024-06-20 VITALS — BP 132/85 | HR 91 | Ht 64.0 in | Wt 190.0 lb

## 2024-06-20 DIAGNOSIS — K146 Glossodynia: Secondary | ICD-10-CM | POA: Diagnosis not present

## 2024-06-20 DIAGNOSIS — J340 Abscess, furuncle and carbuncle of nose: Secondary | ICD-10-CM

## 2024-06-20 DIAGNOSIS — S060X0D Concussion without loss of consciousness, subsequent encounter: Secondary | ICD-10-CM

## 2024-06-20 MED ORDER — MUPIROCIN 2 % EX OINT: 1.0000 | TOPICAL_OINTMENT | Freq: Two times a day (BID) | CUTANEOUS | 0 refills | Status: AC

## 2024-06-20 NOTE — Progress Notes (Signed)
 "  BP 132/85   Pulse 91   Ht 5' 4 (1.626 m)   Wt 190 lb (86.2 kg)   LMP 05/18/2011   SpO2 99%   BMI 32.61 kg/m    Subjective:   Patient ID: [name], female    DOB: 1961-03-18, 64 y.o.   MRN: [id number]  HPI: [name] is a 64 y.o. female presenting on 06/20/2024 for tongue lesion (Right side) and non healing sore right nostril   Discussed the use of AI scribe software for clinical note transcription with the patient, who gave verbal consent to proceed.  History of Present Illness   [name] is a 64 year old female who presents with a sore tongue and a bleeding nostril.  Oral mucosal lesion - Sore spot on tongue for the past week - Described as a 'little slur' possibly from biting tongue - No specific treatment used  Epistaxis and nasal lesion - Bleeding from right nostril persisting for over a week - Sore spot inside right nostril resembling a pimple - Applies gel for temporary relief - Bleeding and soreness localized to inside of right nostril  Otolaryngologic symptoms - Soreness in right ear - Frequent sneezing  Head trauma and headache - Mild concussion at work on Monday - Headache present - No vomiting or dizziness - Monitoring for any worsening symptoms          Relevant past medical, surgical, family and social history reviewed and updated as indicated. Interim medical history since our last visit reviewed. Allergies and medications reviewed and updated.  Review of Systems  Constitutional:  Negative for chills and fever.  HENT:  Positive for ear pain, mouth sores and nosebleeds. Negative for ear discharge.   Eyes:  Negative for redness and visual disturbance.  Respiratory:  Negative for chest tightness and shortness of breath.   Cardiovascular:  Negative for chest pain and leg swelling.  Genitourinary:  Negative for difficulty urinating and dysuria.  Musculoskeletal:  Negative for back pain and gait problem.  Skin:  Negative for rash.   Neurological:  Negative for light-headedness and headaches.  Psychiatric/Behavioral:  Negative for agitation and behavioral problems.   All other systems reviewed and are negative.   Per HPI unless specifically indicated above   Allergies as of 06/20/2024       Reactions   Cephalexin Rash, Shortness Of Breath   Keflex   Lorazepam  Anaphylaxis, Shortness Of Breath   She may be very sensitive to benzo.    Stops breathing    Promethazine     Felt strange, knocked me out   Seasonal Ic [octacosanol] Other (See Comments)        Medication List        Accurate as of June 20, 2024 10:18 AM. If you have any questions, ask your nurse or doctor.          STOP taking these medications    amoxicillin  500 MG capsule Commonly known as: AMOXIL  Stopped by: Fonda Levins, MD   esomeprazole  40 MG capsule Commonly known as: NEXIUM  Stopped by: Fonda Levins, MD   milk thistle 175 MG tablet Stopped by: Fonda Levins, MD   oxyCODONE  5 MG immediate release tablet Commonly known as: Oxy IR/ROXICODONE  Stopped by: Fonda Levins, MD   vitamin B-12 500 MCG tablet Commonly known as: CYANOCOBALAMIN Stopped by: Fonda Levins, MD       TAKE these medications    acetaminophen  650 MG CR tablet Commonly known as: TYLENOL  Take  650 mg by mouth every 8 (eight) hours as needed for pain.   atorvastatin  40 MG tablet Commonly known as: LIPITOR  Take 1 tablet (40 mg total) by mouth daily.   calcium  carbonate 500 MG chewable tablet Commonly known as: TUMS - dosed in mg elemental calcium  Chew 1,000 mg by mouth daily as needed for indigestion or heartburn.   carvedilol  3.125 MG tablet Commonly known as: COREG  Take 1 tablet (3.125 mg total) by mouth 2 (two) times daily with a meal.   cholecalciferol  25 MCG (1000 UNIT) tablet Commonly known as: VITAMIN D3 Take 1,000 Units by mouth daily.   cyclobenzaprine  10 MG tablet Commonly known as: FLEXERIL  Take 10 mg by mouth 2  (two) times daily.   diltiazem  120 MG 24 hr capsule Commonly known as: Cardizem  CD Take 1 capsule (120 mg total) by mouth daily.   estradiol  0.1 MG/GM vaginal cream Commonly known as: ESTRACE  Discard plastic applicator. Insert a blueberry size amount (approximately 1 gram) of cream on fingertip inside vagina at bedtime every night for 1 week then every other night. For long term use. What changed:  how much to take how to take this when to take this   lisinopril  2.5 MG tablet Commonly known as: ZESTRIL  Take 1 tablet (2.5 mg total) by mouth every morning.   mupirocin ointment 2 % Commonly known as: BACTROBAN Apply 1 Application topically 2 (two) times daily for 7 days. Started by: Fonda Levins, MD   nitroGLYCERIN  0.4 MG SL tablet Commonly known as: NITROSTAT  Place 1 tablet (0.4 mg total) under the tongue every 5 (five) minutes as needed for chest pain. DISSOLVE ONE TABLET UNDER THE TONGUE EVERY 5 MINUTES AS NEEDED FOR CHEST PAIN.  DO NOT EXCEED A TOTAL OF 3 DOSES IN 15 MINUTES   nortriptyline  10 MG capsule Commonly known as: PAMELOR  TAKE 3 CAPSULES BY MOUTH AT BEDTIME   polyethylene glycol 17 g packet Commonly known as: MIRALAX  / GLYCOLAX  Take 17 g by mouth daily.   senna-docusate 8.6-50 MG tablet Commonly known as: Senokot-S Take 2 tablets by mouth at bedtime.         Objective:   BP 132/85   Pulse 91   Ht 5' 4 (1.626 m)   Wt 190 lb (86.2 kg)   LMP 05/18/2011   SpO2 99%   BMI 32.61 kg/m   Wt Readings from Last 3 Encounters:  06/20/24 190 lb (86.2 kg)  04/16/24 190 lb (86.2 kg)  02/12/24 187 lb (84.8 kg)    Physical Exam Physical Exam   HEENT: Right nostril with raised spot resembling a pimple. Tongue with small lesion, possibly from biting. Right ear without infection.         Assessment & Plan:   Problem List Items Addressed This Visit   None Visit Diagnoses       Nasal furuncle    -  Primary   Relevant Medications   mupirocin ointment  (BACTROBAN) 2 %     Tongue sore         Concussion without loss of consciousness, subsequent encounter              Nasal furuncle Right nostril with persistent bleeding and pimple, indicating possible infection. - Prescribed mucousy ointment (antibiotic cream) to be applied with a Q-tip twice daily. - Continue using air gel as needed.  Oral aphthous ulcer Sore spot on the tongue likely due to trauma. - Recommended using mouthwash like Listerine twice daily.  Concussion without loss  of consciousness Mild concussion with headache, no vomiting or dizziness. - Advised to report any new symptoms such as vomiting or worsening headaches. - Reassured that mild concussions typically resolve on her own.          Follow up plan: Return if symptoms worsen or fail to improve.  Counseling provided for all of the vaccine components No orders of the defined types were placed in this encounter.   Fonda Levins, MD [facility] 06/20/2024, 10:18 AM     "

## 2024-07-15 NOTE — Progress Notes (Unsigned)
 "  NEUROLOGY FOLLOW UP OFFICE NOTE  [name] [id number]  Subjective:  [name] is a 64 y.o. 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  To briefly review: Initial consultation (04/11/22): Patient has a headache every day for the last 22 years (after skin cancer surgery). Some days it is worse then others. For the last 4-6 months it has been more concentrated around her right eyebrow/eye. Even touching this area causes a lot of pain. It is a throbbing pain, 7/10. The headache takes about 30 minutes to reach maximal intensity. She has to take Excedrin  every day (at least 2 doses per day). This helps for a while. Her headaches last hours. A nap will also help. She denies photophobia or phonophobia. She has a little nausea but is not sure this is related to headaches. Her headaches do not affect her function. She denies focal neurologic deficits before, during, or after headaches, including vision loss. Her headache does not change with position change.   She endorses long standing neck pain.   She mentioned seeing clear diamonds once in her vision prior to a headache last week. This has only happened once though.   She does not remember ever being prescribed a medication for headaches.   Patient also takes Norco for back pain and ?kidney stones.   She drinks 3 cups of coffee and 2 diet sodas per day.   She does not drink EtOH. She does not smoke.   Patient was seen by PCP and ENT. CT of maxillofacial and neck showed no abnormalities. She was prescribed Augmentin  for ear infection. This did not change headache symptoms.   07/21/22: B12 was low at 187. I recommended supplementation with B12 1000 mcg daily on 04/17/22. Patient is taking this.   Patient is now taking nortriptyline  20 mg  qhs. She has some dry mouth, but overall has no side effects. Since starting this medication she has not had any headaches. She has not had to take any Excedrin .   She reports no new complaints.    01/19/23: Patient continues to take Nortriptyline  20 mg at bedtime. She will have a headache every week to 10 day (3-4 per month). They are less severe when she does get a headache. When she gets a headache, she will take Tylenol  (2 tablets) (about 3-4 times per month. This will resolve headache in less than 2 hours.   She denies significant neck pain. She has had an increase in stressors. Her sleep is good. She does feel reduced energy and is not too active.   She is still taking B12.   I increased nortriptyline  to 30 mg on 01/19/24.  07/26/23: Vit D was low. I recommend 1000 international units daily. She is taking this.   She was taking B12 but has not taken it lately.   She is doing well in terms of headaches, she feels like she is doing well. She is currently having about 1 headache per month that is very mild.   Current medications: -Nortriptyline  30 mg at bedtime -Tylenol  as needed   Side effects: None   Patient did have a fall on 06/20/23 when she lost balance putting on her socks. She had left knee pain and back pain. She is having an MRI on the knee  next week.  Most recent Assessment and Plan (07/26/23): This is [name], a 64 y.o. female with: Episodic migraine without aura - well controlled on Nortriptyline  30 mg at bedtime. Now only having about 1 mild headache per month that will respond well to Tylenol  B12 deficiency Vit D deficiency    Plan: -Continue B12 and vit D supplementation -For migraines: Migraine prevention:  Continue Nortripyline 30 mg at bedtime  Migraine rescue:  Tylenol  as needed at headache onset Limit use of pain relievers to no more than 2 days out of week to prevent risk of rebound or medication-overuse headache. Keep headache diary  Since their last  visit: ***  MEDICATIONS:  Outpatient Encounter Medications as of 07/24/2024  Medication Sig   acetaminophen  (TYLENOL ) 650 MG CR tablet Take 650 mg by mouth every 8 (eight) hours as needed for pain.   atorvastatin  (LIPITOR ) 40 MG tablet Take 1 tablet (40 mg total) by mouth daily.   calcium  carbonate (TUMS - DOSED IN MG ELEMENTAL CALCIUM ) 500 MG chewable tablet Chew 1,000 mg by mouth daily as needed for indigestion or heartburn.   carvedilol  (COREG ) 3.125 MG tablet Take 1 tablet (3.125 mg total) by mouth 2 (two) times daily with a meal.   cholecalciferol  (VITAMIN D3) 25 MCG (1000 UNIT) tablet Take 1,000 Units by mouth daily.   cyclobenzaprine  (FLEXERIL ) 10 MG tablet Take 10 mg by mouth 2 (two) times daily.   diltiazem  (CARDIZEM  CD) 120 MG 24 hr capsule Take 1 capsule (120 mg total) by mouth daily.   estradiol  (ESTRACE ) 0.1 MG/GM vaginal cream Discard plastic applicator. Insert a blueberry size amount (approximately 1 gram) of cream on fingertip inside vagina at bedtime every night for 1 week then every other night. For long term use. (Patient taking differently: Place 1 Applicatorful vaginally See admin instructions. Discard plastic applicator. Insert a blueberry size amount (approximately 1 gram) of cream on fingertip inside vagina at bedtime every night for 1 week then every other night. For long term use.)   lisinopril  (ZESTRIL ) 2.5 MG tablet Take 1 tablet (2.5 mg total) by mouth every morning.   nitroGLYCERIN  (NITROSTAT ) 0.4 MG SL tablet Place 1 tablet (0.4 mg total) under the tongue every 5 (five) minutes as needed for chest pain. DISSOLVE ONE TABLET UNDER THE TONGUE EVERY 5 MINUTES AS NEEDED FOR CHEST PAIN.  DO NOT EXCEED A TOTAL OF 3 DOSES IN 15 MINUTES   nortriptyline  (PAMELOR ) 10 MG capsule TAKE 3 CAPSULES BY MOUTH AT BEDTIME   polyethylene glycol (MIRALAX  / GLYCOLAX ) 17 g packet Take 17 g by mouth daily.   senna-docusate (SENOKOT-S) 8.6-50 MG tablet Take 2 tablets by mouth at bedtime.   No  facility-administered encounter medications on file as of 07/24/2024.    PAST MEDICAL HISTORY: Past Medical History:  Diagnosis Date   Anxiety    Arthritis    back (12/30/2014)   Asthma    as a child, no problems as an adult, has an albuterol  inhaler   Cerebral cyst    per brain MRI 07-12-2019 unchanged 6mm cyst inferomedial right frontal lobe   Chronic headaches    Chronic systolic (congestive) heart failure (HCC)    followed by dr orlean   Complication of anesthesia    pt is very sensitive to benzodiazepines, pt stated almost died   Compression fracture of lumbar vertebra (HCC) 05/27/2020   per pt L3 and L4   DCM (dilated cardiomyopathy) (HCC)    w/ [place]lena---  followed by dr  nishan---  stress-induced --- cardiac cath 12-30-2014 ef 30-35%, recovered per cardiac MRI 03-15-2015 ef  64%   Depression    Disorder of mastoid    per brain MRI 07-12-2019 persistant large mastoid effusion   Dysuria    Environmental and seasonal allergies    Frequency of urination    GERD (gastroesophageal reflux disease)    History of asthma    childhood   History of esophageal spasm    History of kidney stones    History of melanoma excision 2001   right supraorbital (right forehead and upper eyelid )  s/p  Moh's procedure w/ sln bx,  no recurrence per pt   History of non-ST elevation myocardial infarction (NSTEMI) 12/29/2014   per cath normal coronaries , ef 30-35%---  stress-- induced Takotsubo syndrome    History of palpitations 01/05/2015   STRESS INDUCED   History of parotid cancer 2008   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-2006 s/p  right lateral parotidectomy w/ nerve dissection , modified radical neck dissection ;  completed  x35 fractions raditation 2008;  no recurrence per pt   Hyperlipidemia    Hypertension    Kidney cysts    Left ureteral calculus    Low back pain    Melanoma (HCC) 2001   face   Muscle spasm of back    Myocardial infarction (HCC)  2016   Pneumonia    as a child   PONV (postoperative nausea and vomiting)    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   Squamous cell carcinoma of skin 02/22/2021   in istu- right forehead ([id number])   Takotsubo syndrome 12/29/2014   cardiologist---  dr orlean--- dx NSTEMI -- stress-induced w/ DCM--  per cath 12-30-2014 normal coronaries , ef 30-35%,  recovered ef per cardiac MRI  ef 64%   Urgency of urination    Wears glasses     PAST SURGICAL HISTORY: Past Surgical History:  Procedure Laterality Date   BACK SURGERY     BREAST BIOPSY Bilateral early 2000's   BREAST BIOPSY Right 01/31/2024   MM RT BREAST BX W LOC DEV 1ST LESION IMAGE BX SPEC STEREO GUIDE 01/31/2024 [facility]   CARDIAC CATHETERIZATION  2006 (APPROX)  [place]   NORMAL   CARDIAC CATHETERIZATION  12/30/2014   CARDIAC CATHETERIZATION N/A 12/30/2014   Procedure: Left Heart Cath and Coronary Angiography;  Surgeon: Deatrice DELENA Cage, MD;  Location: [facility];  Service: Cardiovascular;  Laterality: N/A;   CARDIOVASCULAR STRESS TEST  03-21-2012  DR DELFORD   NORMAL NUCLEAR STUDY/  NO ISCHEMIA/  EF 63%   COLONOSCOPY  10/2020   COLONOSCOPY WITH PROPOFOL   05/29/2017   CYSTOSCOPY W/ URETERAL STENT PLACEMENT Left 03/26/2013   Procedure: CYSTOSCOPY WITH RETROGRADE PYELOGRAM ;  Surgeon: Toribio Neysa Repine, MD;  Location: [facility];  Service: Urology;  Laterality: Left;   CYSTOSCOPY WITH RETROGRADE PYELOGRAM, URETEROSCOPY AND STENT PLACEMENT Bilateral 03/19/2013   Procedure: CYSTOSCOPY WITH RETROGRADE PYELOGRAM, BILATERAL URETEROSCOPY AND STENT PLACEMENT LEFT URETER,BILATERAL STONE EXTRACTION , HOLMIUM LASER LEFT URETER;  Surgeon: Toribio Neysa Repine, MD;  Location: [facility];  Service: Urology;  Laterality: Bilateral;   CYSTOSCOPY WITH STENT PLACEMENT Left 03/26/2013   Procedure: CYSTOSCOPY WITH STENT PLACEMENT;  Surgeon: Toribio Neysa Repine, MD;  Location: [facility];   Service: Urology;  Laterality: Left;   CYSTOSCOPY/URETEROSCOPY/HOLMIUM LASER/STENT PLACEMENT Left 07/07/2020   Procedure: CYSTOSCOPY LEFT URETEROSCOPY/HOLMIUM LASER/STENT PLACEMENT;  Surgeon:  Carolee Sherwood JONETTA DOUGLAS, MD;  Location[facility];  Service: Urology;  Laterality: Left;   DIAGNOSTIC LAPAROSCOPY  04/12/2009   ESOPHAGOGASTRODUODENOSCOPY (EGD) WITH PROPOFOL  N/A 02/10/2016   Procedure: ESOPHAGOGASTRODUODENOSCOPY (EGD) WITH PROPOFOL ;  Surgeon: Lamar Bunk, MD;  Location: [facility];  Service: Endoscopy;  Laterality: N/A;   KIDNEY SURGERY  1966   BILATERAL URETER'S DILATATION   KNEE ARTHROSCOPY WITH MEDIAL MENISECTOMY Left 10/03/2023   Procedure: ARTHROSCOPY, KNEE, WITH MEDIAL MENISCECTOMY;  Surgeon: Duwayne Purchase, MD;  Location: [facility];  Service: Orthopedics;  Laterality: Left;  Left knee arthroscopy, partial medial meniscectomy and debridement   LUMBAR LAMINECTOMY/DECOMPRESSION MICRODISCECTOMY  05/18/2011   Procedure: LUMBAR LAMINECTOMY/DECOMPRESSION MICRODISCECTOMY;  Surgeon: Purchase JAYSON Duwayne;  Location: [facility];  Service: Orthopedics;  Laterality: Right;  Decompression Lumbar 4-Lumbar 5  Right    (xray)    LUMBAR LAMINECTOMY/DECOMPRESSION MICRODISCECTOMY N/A 05/06/2021   Procedure: Microlumbar decompression L5-S1;  Surgeon: Duwayne Purchase, MD;  Location: [facility];  Service: Orthopedics;  Laterality: N/A;  3 C-bed 90 mins   MELANOMA EXCISION WITH SENTINEL LYMPH NODE BIOPSY  2001   moh's procedure/  RIGHT FOREHEAD AND UPPER EYEBROW   MENISCUS DEBRIDEMENT Left 10/03/2023   Procedure: DEBRIDEMENT, MENISCUS, KNEE;  Surgeon: Duwayne Purchase, MD;  Location: [facility];  Service: Orthopedics;  Laterality: Left;   RIGHT LATERAL PAROTIDECTOMY W/ NERVE DISSECTION / RIGHT MODIFIED RADICAL NECK DISSECTION SPARING SCM ELEVENTH NERVE AND INTERNAL JUGULAR VEIN  09-12-2006  DR DWIGHT BATES   DR DWIGHT BATES; inside gland; lots of lymph nodes    ALLERGIES: Allergies[1]  FAMILY  HISTORY: Family History  Problem Relation Age of Onset   Hypertension Mother    COPD Mother    Breast cancer Mother 34       breast   Dementia Mother    Heart disease Father    Cancer Father        Colorectal   Hyperlipidemia Father    Hypertension Father    Colon cancer Sister     SOCIAL HISTORY: Social History[2] Social History   Social History Narrative   Right handed   Caffeine  3 cups daily   Lives one story home with basement 1 time weekly   Lives with husband and a dog      Objective:  Vital Signs:  LMP 05/18/2011   ***  Labs and Imaging review: New results: 04/16/24: CMP unremarkable CBC w/ diff significant for MCV 99  TSH (01/09/24) wnl  MRI lumbar spine wo contrast (07/30/23): FINDINGS: Segmentation: Transitional lumbosacral anatomy with right hemi sacralization of the L5 vertebral segment. The lowest fully formed disc space is labeled L5-S1 to maintain consistency with prior reporting.   Alignment:  Normal.   Vertebrae: Chronic L3 and L4 compression fractures with mild and moderate height loss, respectively. No suspicious marrow lesions. Prior right hemilaminotomy at L4-5 and L5-S1.   Conus medullaris and cauda equina: Conus extends to the T12-L1 level. Conus and cauda equina appear normal.   Paraspinal and other soft tissues: Moderate fatty atrophy of the multifidus muscles.   Disc levels:   T12-L1: Right eccentric disc bulge without spinal canal stenosis. Left-greater-than-right facet arthropathy results in moderate left neural foraminal narrowing.   L1-L2: Small disc bulge and mild bilateral facet arthropathy. No spinal canal stenosis or neural foraminal narrowing.   L2-L3: Small disc bulge without spinal canal stenosis. Right-greater-than-left facet arthropathy results in moderate right neural foraminal narrowing.   L3-L4: Disc bulge and facet arthropathy results in compression of  the traversing left L4 nerve root in the  subarticular zone.   L4-L5: Prior right hemilaminotomy. No central spinal canal stenosis. Disc bulge and facet arthropathy results in compression of the traversing right L5 nerve root in the lateral recess and displacement of the traversing left L5 nerve root in the subarticular zone.   L5-S1: Prior right hemilaminotomy. Left subarticular disc protrusion abuts the traversing left S1 nerve root in the subarticular zone. Right eccentric disc bulge and facet arthropathy with endplate osteophytes results in compression of the traversing right S1 nerve root in the lateral recess. Moderate bilateral neural foraminal narrowing.   IMPRESSION: 1. Transitional lumbosacral anatomy with spinal numbering as detailed above. Careful attention to anatomy and numbering is advised when planning spinal intervention. 2. Prior right hemilaminotomy at L4-5 and L5-S1. 3. Compression of the traversing left L4 nerve root in the subarticular zone at L3-L4. 4. Compression of the traversing right L5 nerve root in the lateral recess at L4-5. 5. Compression of the traversing right S1 nerve root in the lateral recess at L5-S1. 6. Moderate neural foraminal narrowing at multiple levels, as detailed above. 7. Chronic L3 and L4 compression fractures with mild and moderate height loss, respectively.  MRI left knee (07/30/23): IMPRESSION: 1. Complete radial tear of the root of the posterior horn medial meniscus and peripheral meniscal extrusion. 2. Focal cartilage fissuring of the patellar apex. 3. Mild partial-thickness cartilage loss of the medial femorotibial compartment.  Previously reviewed results: Vit D (01/19/23): low at 29   TSH (04/13/23): wnl   07/16/23: Lipid panel: tChol 206, LDL 110, TG 113 CBC w/ diff: MCV 97 CMP unremarkable   04/11/22: B12: 187 Folate: wnl   Recent Labs           Lab Results  Component Value Date    HGBA1C 5.5 01/03/2017      Recent Labs           Lab Results   Component Value Date    TSH 1.250 02/03/2022      Normal or unremarkable: CMP CBC significant for elevated MCV (99)   Imaging: CT maxillofacial wo contrast (01/30/22): FINDINGS: Paranasal sinuses:   Frontal: Normally aerated. Patent frontal sinus drainage pathways.   Ethmoid: Minor mucosal thickening.   Maxillary: Minor mucosal thickening.   Sphenoid: Normally aerated. Patent sphenoethmoidal recesses.   Right ostiomeatal unit: Patent.   Left ostiomeatal unit: Patent.   Nasal passages: Patent. Intact nasal septum is mildly deviated to the right.   Anatomy: Cribriform plate and fovea ethmoidalis are intact. The fovea ethmoidalis is slightly higher on the left. Sellar sphenoid pneumatization pattern. No dehiscence of carotid or optic canals. No onodi cell.   Other: Resection of right parotid and submandibular glands with additional soft tissue thickening presumably reflecting sequelae of neck dissection. Similar appearance to prior neck CT.   IMPRESSION: No significant paranasal sinus inflammatory changes at the time of imaging. Drainage pathways are patent. Mild rightward deviation of the nasal septum.   CT soft tissue neck w contrast (08/08/21): FINDINGS: Pharynx and larynx: Streak and beam hardening artifact arising from dental restoration partially obscures the oral cavity. No appreciable swelling or discrete mass within the oral cavity, pharynx or larynx.   Salivary glands: Redemonstrated sequela of prior right parotid and submandibular gland resection, and presumed right neck is dissection (with unchanged scarring at these sites).Unremarkable appearance of the left parotid and submandibular glands.   Thyroid : Atrophy of the right lobe. Otherwise unremarkable.   Lymph nodes: No  pathologically enlarged cervical chain lymph nodes identified.   Vascular: The major vascular structures of the neck are patent. Atherosclerotic plaque within the visualized aortic  arch, proximal major branch vessels of the neck and carotid arteries.   Limited intracranial: No appreciable acute intracranial abnormality within the field of view.   Visualized orbits: Incompletely imaged. No orbital mass or acute orbital finding at the imaged levels.   Mastoids and visualized paranasal sinuses: Small mucous retention cyst within the left maxillary sinus. Chronic right mastoid effusion.   Skeleton: Straightening of the expected cervical lordosis. Cervical spondylosis. No acute bony abnormality or aggressive osseous lesion.   Upper chest: No consolidation within the imaged lung apices.   IMPRESSION: Redemonstrated sequela of prior right parotid and submandibular gland resection, and presumed right neck dissection. No evidence of residual/recurrent tumor.   No pathologically enlarged cervical chain lymph nodes.   Small left maxillary sinus mucous retention cyst.   Chronic right mastoid effusion.   Cervical spondylosis.   Aortic Atherosclerosis (ICD10-I70.0).   CT renal stone study (04/03/22): FINDINGS: Lower chest: In image 20 of series 5, there is a 1 cm nodular density in lingula which was not evident in the previous study. There is no focal consolidation in the lower lung fields. There is no pleural effusion.   Hepatobiliary: No focal abnormalities are seen in liver. There is no dilation of bile ducts.   Pancreas: No focal abnormalities are seen.   Spleen: Unremarkable.   Adrenals/Urinary Tract: Adrenals are unremarkable. There is no hydronephrosis. There are no renal stones. Previously seen left renal stones are not evident in the current study. There are no calcific densities in the courses of both ureters. Urinary bladder is not distended.   Stomach/Bowel: Stomach is unremarkable. Possible oral medication is seen in the lumen of stomach. Small bowel loops are not dilated. Appendix is difficult to visualize. There are no secondary signs  of appendicitis in the right lower quadrant. There is no wall thickening in colon. There is no pericolic stranding. There is incomplete distention of sigmoid colon.   Vascular/Lymphatic: Scattered arterial calcifications are seen.   Reproductive: Unremarkable.   Other: There is no ascites or pneumoperitoneum. Small umbilical hernia containing fat is seen.   Musculoskeletal: There is 30% decrease in height of body of L3 vertebra. Schmorl's node is seen in the upper endplate of body of L3 vertebra. There is 50% decrease in height of body of L4 vertebra. These findings were not seen in the previous examination.   IMPRESSION: There is no evidence of intestinal obstruction or pneumoperitoneum. There is no hydronephrosis.   There is a new 1 cm noncalcified nodular density in the lingula and left lower lung field. Consider one of the following in 3 months for both low-risk and high-risk individuals: (a) repeat chest CT, (b) follow-up PET-CT, or (c) tissue sampling. This recommendation follows the consensus statement: Guidelines for Management of Incidental Pulmonary Nodules Detected on CT Images: From the Fleischner Society 2017; Radiology 2017; 284:228-243.   There is interval decrease in height of bodies of L3 and L4 vertebrae since 04/21/2016 suggesting fractures of indeterminate age. Please correlate with clinical symptoms and physical examination findings.  CT soft tissue neck (03/27/23): IMPRESSION: 1. No evidence of recurrent or metastatic disease. 2. Surgical absence of the right parotid gland. Question right submandibular resection versus post radiation atrophy. 3. No abnormality seen to explain the clinical presentation. 4. Atherosclerotic change at both carotid bifurcations. No acute vascular finding. 5. Some scarring in  the region of the right neck particularly affecting the sternocleidomastoid muscle. No finding that would suggest recurrent  malignancy.  Assessment/Plan:  This is Mitzie ONEIDA Christian, a 64 y.o. female with: ***   Plan: ***  Return to clinic in ***  Total time spent reviewing records, interview, history/exam, documentation, and coordination of care on day of encounter:  *** min  Venetia Potters, MD    [1]  Allergies Allergen Reactions   Cephalexin Rash and Shortness Of Breath    Keflex   Lorazepam  Anaphylaxis and Shortness Of Breath    She may be very sensitive to benzo.    Stops breathing    Promethazine      Felt strange, knocked me out   Seasonal Ic [Octacosanol] Other (See Comments)  [2]  Social History Tobacco Use   Smoking status: Never   Smokeless tobacco: Never  Vaping Use   Vaping status: Never Used  Substance Use Topics   Alcohol use: Not Currently    Comment: occasional- 1 drink per month   Drug use: Never   "

## 2024-07-24 ENCOUNTER — Ambulatory Visit: Payer: BC Managed Care – PPO | Admitting: Neurology

## 2024-10-15 ENCOUNTER — Ambulatory Visit: Admitting: Family Medicine

## 2025-01-13 ENCOUNTER — Ambulatory Visit

## 2025-02-11 ENCOUNTER — Ambulatory Visit: Admitting: Neurology
